# Patient Record
Sex: Male | Born: 1950 | Race: White | Hispanic: No | Smoking: Former smoker
Health system: Southern US, Community
[De-identification: ages and names within clinical notes are randomized; demographics above are authoritative.]

## PROBLEM LIST (undated history)

## (undated) DIAGNOSIS — I330 Acute and subacute infective endocarditis: Secondary | ICD-10-CM

## (undated) DIAGNOSIS — M1990 Unspecified osteoarthritis, unspecified site: Secondary | ICD-10-CM

## (undated) DIAGNOSIS — G4730 Sleep apnea, unspecified: Secondary | ICD-10-CM

## (undated) DIAGNOSIS — N189 Chronic kidney disease, unspecified: Secondary | ICD-10-CM

## (undated) DIAGNOSIS — R011 Cardiac murmur, unspecified: Secondary | ICD-10-CM

## (undated) DIAGNOSIS — Z9889 Other specified postprocedural states: Secondary | ICD-10-CM

## (undated) DIAGNOSIS — I340 Nonrheumatic mitral (valve) insufficiency: Secondary | ICD-10-CM

## (undated) DIAGNOSIS — I219 Acute myocardial infarction, unspecified: Secondary | ICD-10-CM

## (undated) DIAGNOSIS — I469 Cardiac arrest, cause unspecified: Secondary | ICD-10-CM

## (undated) DIAGNOSIS — I639 Cerebral infarction, unspecified: Secondary | ICD-10-CM

## (undated) DIAGNOSIS — K219 Gastro-esophageal reflux disease without esophagitis: Secondary | ICD-10-CM

## (undated) DIAGNOSIS — I10 Essential (primary) hypertension: Secondary | ICD-10-CM

## (undated) DIAGNOSIS — E785 Hyperlipidemia, unspecified: Secondary | ICD-10-CM

## (undated) DIAGNOSIS — I509 Heart failure, unspecified: Secondary | ICD-10-CM

## (undated) DIAGNOSIS — T7840XA Allergy, unspecified, initial encounter: Secondary | ICD-10-CM

## (undated) DIAGNOSIS — R112 Nausea with vomiting, unspecified: Secondary | ICD-10-CM

## (undated) DIAGNOSIS — H269 Unspecified cataract: Secondary | ICD-10-CM

## (undated) DIAGNOSIS — E119 Type 2 diabetes mellitus without complications: Secondary | ICD-10-CM

## (undated) DIAGNOSIS — I4891 Unspecified atrial fibrillation: Secondary | ICD-10-CM

## (undated) DIAGNOSIS — I2510 Atherosclerotic heart disease of native coronary artery without angina pectoris: Secondary | ICD-10-CM

## (undated) HISTORY — DX: Nausea with vomiting, unspecified: Z98.890

## (undated) HISTORY — DX: Unspecified cataract: H26.9

## (undated) HISTORY — DX: Nausea with vomiting, unspecified: R11.2

## (undated) HISTORY — PX: POLYPECTOMY: SHX149

## (undated) HISTORY — DX: Chronic kidney disease, unspecified: N18.9

## (undated) HISTORY — DX: Cardiac arrest, cause unspecified: I46.9

## (undated) HISTORY — PX: TONSILLECTOMY: SUR1361

## (undated) HISTORY — PX: SEPTOPLASTY: SUR1290

## (undated) HISTORY — DX: Unspecified atrial fibrillation: I48.91

## (undated) HISTORY — DX: Cerebral infarction, unspecified: I63.9

## (undated) HISTORY — DX: Acute myocardial infarction, unspecified: I21.9

## (undated) HISTORY — PX: COLONOSCOPY: SHX174

## (undated) HISTORY — DX: Hyperlipidemia, unspecified: E78.5

## (undated) HISTORY — PX: UVULOPALATOPHARYNGOPLASTY: SHX827

## (undated) HISTORY — DX: Heart failure, unspecified: I50.9

## (undated) HISTORY — DX: Type 2 diabetes mellitus without complications: E11.9

## (undated) HISTORY — DX: Sleep apnea, unspecified: G47.30

## (undated) HISTORY — DX: Acute and subacute infective endocarditis: I33.0

## (undated) HISTORY — PX: FOOT SURGERY: SHX648

## (undated) HISTORY — DX: Atherosclerotic heart disease of native coronary artery without angina pectoris: I25.10

## (undated) HISTORY — DX: Other specified postprocedural states: Z98.890

## (undated) HISTORY — DX: Essential (primary) hypertension: I10

## (undated) HISTORY — DX: Allergy, unspecified, initial encounter: T78.40XA

## (undated) HISTORY — PX: CARDIAC CATHETERIZATION: SHX172

---

## 1998-06-06 ENCOUNTER — Other Ambulatory Visit: Admission: RE | Admit: 1998-06-06 | Discharge: 1998-06-06 | Payer: Self-pay | Admitting: Gastroenterology

## 2000-01-21 ENCOUNTER — Ambulatory Visit (HOSPITAL_BASED_OUTPATIENT_CLINIC_OR_DEPARTMENT_OTHER): Admission: RE | Admit: 2000-01-21 | Discharge: 2000-01-21 | Payer: Self-pay | Admitting: Orthopedic Surgery

## 2002-08-24 ENCOUNTER — Ambulatory Visit (HOSPITAL_COMMUNITY): Admission: RE | Admit: 2002-08-24 | Discharge: 2002-08-24 | Payer: Self-pay | Admitting: Family Medicine

## 2002-08-24 ENCOUNTER — Encounter: Payer: Self-pay | Admitting: Cardiology

## 2006-01-04 ENCOUNTER — Emergency Department (HOSPITAL_COMMUNITY): Admission: EM | Admit: 2006-01-04 | Discharge: 2006-01-04 | Payer: Self-pay | Admitting: Emergency Medicine

## 2006-01-21 ENCOUNTER — Encounter: Payer: Self-pay | Admitting: Internal Medicine

## 2006-01-21 ENCOUNTER — Ambulatory Visit (HOSPITAL_BASED_OUTPATIENT_CLINIC_OR_DEPARTMENT_OTHER): Admission: RE | Admit: 2006-01-21 | Discharge: 2006-01-21 | Payer: Self-pay | Admitting: Otolaryngology

## 2006-01-24 ENCOUNTER — Ambulatory Visit: Payer: Self-pay | Admitting: Internal Medicine

## 2006-02-18 ENCOUNTER — Observation Stay (HOSPITAL_COMMUNITY): Admission: RE | Admit: 2006-02-18 | Discharge: 2006-02-19 | Payer: Self-pay | Admitting: Otolaryngology

## 2006-02-18 ENCOUNTER — Encounter (INDEPENDENT_AMBULATORY_CARE_PROVIDER_SITE_OTHER): Payer: Self-pay | Admitting: Specialist

## 2007-10-07 ENCOUNTER — Emergency Department (HOSPITAL_COMMUNITY): Admission: EM | Admit: 2007-10-07 | Discharge: 2007-10-07 | Payer: Self-pay | Admitting: Emergency Medicine

## 2007-10-12 ENCOUNTER — Inpatient Hospital Stay (HOSPITAL_COMMUNITY): Admission: AD | Admit: 2007-10-12 | Discharge: 2007-10-14 | Payer: Self-pay | Admitting: Cardiology

## 2007-10-12 ENCOUNTER — Ambulatory Visit: Payer: Self-pay | Admitting: Internal Medicine

## 2008-03-22 DIAGNOSIS — I330 Acute and subacute infective endocarditis: Secondary | ICD-10-CM

## 2008-03-22 HISTORY — DX: Acute and subacute infective endocarditis: I33.0

## 2008-08-22 ENCOUNTER — Encounter (INDEPENDENT_AMBULATORY_CARE_PROVIDER_SITE_OTHER): Payer: Self-pay | Admitting: *Deleted

## 2008-09-27 ENCOUNTER — Ambulatory Visit: Payer: Self-pay | Admitting: Internal Medicine

## 2008-09-27 DIAGNOSIS — G4730 Sleep apnea, unspecified: Secondary | ICD-10-CM | POA: Insufficient documentation

## 2008-09-27 DIAGNOSIS — J309 Allergic rhinitis, unspecified: Secondary | ICD-10-CM | POA: Insufficient documentation

## 2008-09-27 DIAGNOSIS — T7840XA Allergy, unspecified, initial encounter: Secondary | ICD-10-CM

## 2008-09-27 DIAGNOSIS — I10 Essential (primary) hypertension: Secondary | ICD-10-CM | POA: Insufficient documentation

## 2008-09-27 HISTORY — DX: Allergy, unspecified, initial encounter: T78.40XA

## 2008-10-04 ENCOUNTER — Encounter: Payer: Self-pay | Admitting: Internal Medicine

## 2008-10-08 ENCOUNTER — Encounter: Payer: Self-pay | Admitting: Internal Medicine

## 2008-10-17 ENCOUNTER — Encounter: Payer: Self-pay | Admitting: Internal Medicine

## 2008-11-01 ENCOUNTER — Telehealth: Payer: Self-pay | Admitting: Internal Medicine

## 2009-07-17 ENCOUNTER — Telehealth: Payer: Self-pay | Admitting: Gastroenterology

## 2010-02-24 ENCOUNTER — Ambulatory Visit: Payer: Self-pay | Admitting: Cardiology

## 2010-02-24 ENCOUNTER — Ambulatory Visit (HOSPITAL_COMMUNITY): Admission: RE | Admit: 2010-02-24 | Discharge: 2010-02-24 | Payer: Self-pay | Admitting: Cardiology

## 2010-02-25 ENCOUNTER — Emergency Department (HOSPITAL_COMMUNITY): Admission: EM | Admit: 2010-02-25 | Discharge: 2010-02-26 | Payer: Self-pay | Admitting: Emergency Medicine

## 2010-08-07 NOTE — Progress Notes (Signed)
Summary: report CPAP download  Phone Note Call from Patient   Caller: Patient Call For: Yarenis Cerino Summary of Call: [facility] have not receive report back on c pap machine Initial call taken by: Rickard Patience,  November 01, 2008 10:38 AM  Follow-up for Phone Call        Bjorn Loser do you know anything about this report it looks like it has been sent , please advise I am not sure who to talk advance Renold Genta RCP, LPN  November 01, 2008 10:48 AM    Ballard Russell with [facility] and download faxed over to Dr. Maple Hudson to review and set pressure. Waiting on order from Dr. Maple Hudson. Alfonso Ramus  November 01, 2008 11:24 AM   Additional Follow-up for Phone Call Additional follow up Details #1::        Done Additional Follow-up by: Waymon Budge MD,  November 01, 2008 2:55 PM    Additional Follow-up for Phone Call Additional follow up Details #2::    Called pt and LMOAM that Dr. Maple Hudson has recvd. download and has sent an order to [facility] to change CPAP pressure to a fixed pressure of 10. Advised pt that if he had any questions about order to call me at [phone], if not, [facility] would be getting in contact with him to change his cpap pressure. Follow-up by: Alfonso Ramus,  November 01, 2008 3:42 PM  New/Updated Medications: * CPAP 10 ADVANCED

## 2010-08-07 NOTE — Assessment & Plan Note (Signed)
Summary: SLEEP APNEA///KP   Primary Provider/Referring Provider:  Shary KeyErnesto Rutherford  CC:  Sleep apnea-consult-Dr. [name].  History of Present Illness:  2008-10-02- New Sleep Medicine Consult for Dr [name]. 33 yoM with sleep apnea.  He had NPSG in 2007- mderately severe obstructive sleep apnea with AHI 40.7/hr.(Weight 190). Had UPPP. since then has gained 15 lbs. He sleeps alone and has no report of snoring. He still feels he sleeps better than before surgery, but is fighting sleepiness after lunch. A neighbor gave him a cpap machine, unknown pressure. a full-face mask fits well. It may help some. Bedtime 04-1029 PM, sleep latency 30-60 mnutes, waking 2-3 for nocturia before up at 5AM.  Preventive Screening-Counseling & Management     Smoking Status: quit  Current Medications (verified): 1)  Klor-Con M10 10 Meq Cr-Tabs (Potassium Chloride Crys Cr) .... Take 1 By Mouth Once Daily 2)  Hydrochlorothiazide 25 Mg Tabs (Hydrochlorothiazide) .... Take 1 By Mouth Once Daily 3)  Amlodipine Besylate 5 Mg Tabs (Amlodipine Besylate) .... Take 1 By Mouth Once Daily 4)  Ramipril 10 Mg Caps (Ramipril) .... Take 1 By Mouth Once Daily 5)  Zyrtec Allergy 10 Mg Tabs (Cetirizine Hcl) .... Take 1 By Mouth Once Daily  Allergies (verified): No Known Drug Allergies  Past History:  Family History:    Father- died renal cnacer (Oct 02, 2008)  Social History:    Patient states former smoker. 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, 2008)  Risk Factors:    Alcohol Use: N/A    >5 drinks/d w/in last 3 months: N/A    Caffeine Use: N/A    Diet: N/A    Exercise: N/A  Risk Factors:    Smoking Status: quit (02-Oct-2008)    Packs/Day: N/A    Cigars/wk: N/A    Pipe Use/wk: N/A    Cans of tobacco/wk: N/A    Passive Smoke Exposure: N/A  Past Medical History:    Sleep Apnea- NPSG 01/21/06- AHI 40.7/hr,     Staph endocarditis 2009    Hypertension    Allergic Rhinitis  Past Surgical History:  UPPP, septoplasty- Dr [name]    Tonsillectomy  Family History:    Reviewed history and no changes required:       Father- died renal cnacer, hx heart disease  Social History:    Reviewed history and no changes required:       Patient states former smoker. -quit 1980       divorced       Works Training and development officer    Smoking Status:  quit  Review of Systems      See HPI       The patient complains of nasal congestion/difficulty breathing through nose.  The patient denies shortness of breath with activity, shortness of breath at rest, chest pain, and headaches.    Vital Signs:  Patient profile:   60 year old male Height:      70 inches Weight:      205.13 pounds BMI:     29.54 Pulse rate:   69 / minute BP sitting:   118 / 80  (left arm) Cuff size:   large  Vitals Entered By: Reynaldo Minium CMA (10-02-2008 3:03 PM)  O2 Sat on room air at rest %:  97 CC: Sleep apnea-consult-Dr. [name] Comments Medications reviewed with patient Reynaldo Minium CMA  02-Oct-2008 3:03 PM    Physical Exam  Additional Exam:  General: A/Ox3; pleasant  and cooperative, NAD, mild abdominal obesity SKIN: no rash, lesions NODES: no lymphadenopathy HEENT: [place]/AT, EOM- WNL, Conjuctivae- clear, PERRLA, TM-WNL, Nose- clear, Throat- clear and wnl, Mel[place]atti II NECK: Supple w/ fair ROM, JVD- none, normal carotid impulses w/o bruits Thyroid- CHEST: Clear to P&A HEART: RRR, 2-3/6 systolic murumur apex to upper sternusm ABDOMEN: UXN:ATFT, nl pulses, no edema  NEURO: Grossly intact to observation      Pulmonary Function Test Height (in.): 70 Gender: Male  Impression & Recommendations:  Problem # 1:  SLEEP APNEA (ICD-780.57)  OSA, Says he slept better after  prior surgery. Now more symptomatic partly due to weight gain. We discussed OSA, treatments, safe driving. Wiill get him titrated for an adequate CPAP trial since he has been using an unfitted machine.  Problem # 2:  ALLERGIC RHINITIS (ICD-477.9)  We will watch for impact on sleep - disordered breathing. His updated medication list for this problem includes:    Zyrtec Allergy 10 Mg Tabs (Cetirizine hcl) .Marland Kitchen... Take 1 by mouth once daily  Complete Medication List: 1)  Klor-con M10 10 Meq Cr-tabs (Potassium chloride crys cr) .... Take 1 by mouth once daily 2)  Hydrochlorothiazide 25 Mg Tabs (Hydrochlorothiazide) .... Take 1 by mouth once daily 3)  Amlodipine Besylate 5 Mg Tabs (Amlodipine besylate) .... Take 1 by mouth once daily 4)  Ramipril 10 Mg Caps (Ramipril) .... Take 1 by mouth once daily 5)  Zyrtec Allergy 10 Mg Tabs (Cetirizine hcl) .... Take 1 by mouth once daily  Other Orders: Consultation Level III (73220) DME Referral (DME)  Patient Instructions: 1)  Please schedule a follow-up appointment in 1 month. 2)  See [facility] for CPAP

## 2010-08-07 NOTE — Letter (Signed)
Summary: Recall Colonoscopy Letter  [facility]  [address] [place], [place] [address]   Phone: [phone]  Fax: [phone]      August 22, 2008 MRN: [id number]   Christopher [name] [address] [place], [place]  [address]   Dear [name],   According to your medical record, it is time for you to schedule a Colonoscopy. The American Cancer Society recommends this procedure as a method to detect early colon cancer. Patients with a family history of colon cancer, or a personal history of colon polyps or inflammatory bowel disease are at increased risk.  This letter has beeen generated based on the recommendations made at the time of your procedure. If you feel that in your particular situation this may no longer apply, please contact our office.  Please call our office at [phone] to schedule this appointment or to update your records at your earliest convenience.  Thank you for cooperating with [place] to provide you with the very best care possible.   Sincerely,  Judie Petit T. Russella Dar, M.D.  [facility] [phone]

## 2010-08-07 NOTE — Miscellaneous (Signed)
Summary: CPAP/[facility]  CPAP/[facility]   Imported By: Lanelle Bal 10/10/2008 13:11:36  _____________________________________________________________________  External Attachment:    Type:   Image     Comment:   External Document

## 2010-08-07 NOTE — Progress Notes (Signed)
Summary: Change practices.   Phone Note Outgoing Call Call back at [facility] Phone [phone]   Call placed by: Harlow Mares CMA Duncan Dull),  July 17, 2009 3:04 PM Call placed to: Patient Summary of Call: patient states that last year his [facility] MD sent him to Dr. Loreta Ave for a colonoscopy due to his anemia. I had Lady Gary put a note in IDX that the patient changed practices.  Initial call taken by: Harlow Mares CMA (AAMA),  July 17, 2009 3:05 PM

## 2010-09-19 LAB — URINALYSIS, ROUTINE W REFLEX MICROSCOPIC
Bilirubin Urine: NEGATIVE
Glucose, UA: NEGATIVE mg/dL
Hgb urine dipstick: NEGATIVE
Ketones, ur: NEGATIVE mg/dL
Nitrite: NEGATIVE
Protein, ur: NEGATIVE mg/dL
Specific Gravity, Urine: 1.008 (ref 1.005–1.030)
Urobilinogen, UA: 0.2 mg/dL (ref 0.0–1.0)
pH: 7 (ref 5.0–8.0)

## 2010-11-18 NOTE — H&P (Signed)
NAME:  Christopher [name], Christopher [name] NO.:  [id number]   MEDICAL RECORD NO.:  [id number]          PATIENT TYPE:  INP   LOCATION:  3737                         FACILITY:  [facility]   PHYSICIAN:  Colleen Can. Deborah Chalk, M.D.DATE OF BIRTH:  08-17-1950   DATE OF ADMISSION:  10/12/2007  DATE OF DISCHARGE:                              HISTORY & PHYSICAL   CHIEF COMPLAINT:  Weakness, fevers in the setting of a murmur.   HISTORY OF PRESENT ILLNESS:  Mr. Thivierge is a 60 year old white male who  was referred to our office for evaluation of weakness and the  possibility of coronary artery disease.  Approximately 3 days ago, over  the course of the past weekend while he was at work, he had a feeling of  marked weakness.  He had no real chest pain but felt very pale and very  clammy and was noted to be hypotensive.  He has had a long history of a  heart murmur and has had recurrent fevers over the past 2 months.  He  has taken antibiotics off and on, and there was a concern that this  might be sinusitis.  Apparently he has had bouts of sinusitis and has  had previous operations in the past and was presenting with facial  pressure at that time.  He had not had cough or really sputum  production.  He has undergone a 2-D echocardiogram, which showed an  ejection fraction at 55% to 60%.  There was mitral valve prolapse with  moderate MR, diastolic dysfunction, normal LV systolic function and  redundant anterior mitral valve leaflet, and vegetation could not be  excluded.  The following day, he underwent a stress Cardiolite study,  where he exercised for 7 minutes.  His heart rate was inadequate, and he  subsequently underwent an adenosine protocol.  With that he had a small  apical perfusion defect and cardiac catheterization was entertained.  In  the interim, he was referred for blood cultures.  These have come back  with one set being positive for gram-positive cocci in chains.  He is  now admitted  with what is presumed to be SBE.   PAST MEDICAL HISTORY:  1. Hypertension.  2. Longstanding history of a heart murmur.  3. Recurrent fevers over the past 2 months.  4. Sinusitis.  5. Previous sinus surgery.  6. History of kidney stones.  7. Strongly positive family history of heart disease.  8. History of anemia.  9. Tonsillectomy.  10.Foot surgery.  11.Hiatal hernia.   ALLERGIES:  PENICILLIN, WHICH IS QUESTIONABLE.  He notes that when he  was a young child he had white spots on the back of his throat and there  was concern for a possible penicillin allergy; however, he states he has  taken penicillin products since that time and has had no problems.  He  is unable to give the names of other antibiotics that he has been  treated with, however.   CURRENT MEDICATIONS:  His current medicines are ramipril 10 mg a day and  Zyrtec 10 mg a day.   FAMILY  HISTORY:  Father died at 3 with heart disease, kidney disease,  cancer and diabetes.  Mother is alive at the age of 61.  He has had one  brother who died recently at the age of 13 due to a heart attack.   SOCIAL HISTORY:  He is divorced.  He drinks beer occasionally.  He has  not smoked in over 30 years.  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   PHYSICAL EXAMINATION:  GENERAL:  On exam, he is somewhat anxious.  He is  in no acute distress.  VITAL SIGNS:  His weight is 198-1/2 pounds, blood pressure 122/70  sitting, 120/60 standing, heart rate 100, respirations 18.  He is  afebrile.  SKIN:  Warm and dry.  Color is somewhat sallow.  NECK:  Supple.  LUNGS:  Clear.  HEART:  Shows a grade 2 to 3 apical murmur.  ABDOMEN:  Soft.  EXTREMITIES:  Without edema.  NEUROLOGIC:  Shows no gross focal deficits.   Pertinent labs are pending except for one blood culture that is positive  for gram-positive cocci in chains.   OVERALL IMPRESSION:  1. 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  2. Multiple somatic complaints with weakness,  recent hypotension and      fevers.  3. History of hypertension.  4. History of kidney stones.  5. Positive family history of heart disease.   PLAN:  The patient is admitted from the office.  Repeat blood cultures  will be drawn.  Infectious disease consult has been called for.  Further  treatment plan to follow per Dr. Ronnald Nian discretion.      Sharlee Blew, N.P.      Colleen Can. Deborah Chalk, M.D.  Electronically Signed    LC/MEDQ  D:  10/12/2007  T:  10/12/2007  Job:  [id number]

## 2010-11-21 NOTE — Discharge Summary (Signed)
NAME:  Christopher [name], Christopher [name] NO.:  [id number]   MEDICAL RECORD NO.:  [id number]          PATIENT TYPE:  INP   LOCATION:  3737                         FACILITY:  [facility]   PHYSICIAN:  Colleen Can. Deborah Chalk, M.D.DATE OF BIRTH:  29-Mar-1951   DATE OF ADMISSION:  10/12/2007  DATE OF DISCHARGE:  10/14/2007                               DISCHARGE SUMMARY   DISCHARGE DIAGNOSIS:  1. Endocarditis.  2. Hypertension.  3. Long-standing history of a heart murmur.  4. Hiatal hernia.   HISTORY OF PRESENT ILLNESS:  Mr. Conradt is a 60 year old white male who  was referred to our office for evaluation of weakness and the  possibility of coronary artery disease.  He has had a brother who  recently died at the age of 67 due to heart attack.  He is noted over  the past 2 months that he has had recurrent fevers off and on.  He has  used some antibiotics off and on as well thinking that this might be  sinusitis.  He has not really had cough or sputum production.  He was in  the emergency room over the course of 2 weekends ago and basically had a  negative evaluation.  Since that time, he has had a 2-D echocardiogram  which showed an ejection fraction of 55-60% with mitral valve prolapse.  There was a question of a vegetational anterior mitral valve leaflet.  Following day, he underwent a stress cardiology study, where he  exercised for 7 minutes and subsequently, switched to an adenosine  protocol because his heart rate was inadequate.  He did have a small  apical perfusion defect and the idea of cardiac catheterization was  entertained.  However, in the interim, the patient had been referred for  blood cultures.  Once it did come back positive for gram positive cocci  in chains, and he was subsequently admitted which was presumed to be  SBE.   Please see a dictated history and physical for further patient  presentation and profile.   LABORATORY DATA:  On admission, his PT and PTT were  unremarkable.  His  chemistries on admission were normal except for a glucose of 113, BUN  was 13, creatinine 0.9.  TSH was normal at 2.557.  Urinalysis was  unremarkable.  Chest x-ray following PICC line showed mild cardiomegaly  and central venous congestion.   HOSPITAL COURSE:  The patient was admitted electively from the office  which was presumed to be SBE.  Antibiotics with ampicillin and  gentamicin were subsequently started.  The patient was seen in  consultation by infectious disease.  Repeat blood cultures were drawn as  well, which at this point in time, have showed no further growth.  He  has currently had 2 out of 4 blood cultures positive for Strep viridans.  On Friday, October 14, 2007, the patient was felt to be a stable candidate  for discharge following PICC line placement with plans for outpatient  antibiotic therapy.   Discharge condition is stable.   DISCHARGE INSTRUCTIONS:  He is to follow up with Dr. Deborah Chalk upon  discharge.  He will need to have followup blood cultures 2 weeks and  again in 4 weeks after his last dose of antibiotics.   DISCHARGE MEDICINES:  1. Altace 10 mg a day.  2. Zyrtec daily.  3. Rocephin 2 g IV daily through Nov 08, 2007 via PICC line with      dressing changes for advanced wound care.  4. Potassium 10 mEq b.i.d.  5. Potassium citrate 10 mg b.i.d.  6. Hydrochlorothiazide 25 b.i.d. were restarted as well.   He is to call our office for an appointment, and certainly sooner, if  any problems would arise in the interim.      Sharlee Blew, N.P.      Colleen Can. Deborah Chalk, M.D.  Electronically Signed    LC/MEDQ  D:  10/17/2007  T:  10/18/2007  Job:  [id number]   cc:   Dr. Orvan Falconer

## 2010-11-21 NOTE — Discharge Summary (Signed)
NAME:  Christopher [name], VITOLO NO.:  [id number]   MEDICAL RECORD NO.:  [id number]          PATIENT TYPE:  OBV   LOCATION:  3311                         FACILITY:  [facility]   PHYSICIAN:  Hermelinda Medicus, M.D.   DATE OF BIRTH:  May 08, 1951   DATE OF ADMISSION:  02/18/2006  DATE OF DISCHARGE:  02/19/2006                                 DISCHARGE SUMMARY   This patient is a 60 year old male who has had considerable difficulty with  his sinuses.  He has had treatments medically on several occasions and also  more recently has had a CAT scan of his sinuses which showed mucosal  thickening of the maxillary with mucous retention cyst and also of the  ethmoid sinuses.  He also has a septal deviation, left turbinate  hypertrophy.  He also was tested recently with a polysomnogram showing sleep  apnea.  He has moderately severe obstructive sleep apnea diagnosed with an  AHI/RDI of 40.7.  Lowest O2 was 79%.  It was felt that he was a candidate  for CPAP, but it was refused, and he could not possibly have used the CPAP  because of the septal deviation and turbinate hypertrophy.  He wishes to  have surgery to improve his nasal airway, improve his sinus status and  improve his sleep status.  HE HAS AN ALLERGY TO PENICILLIN.   His further past history is one of tonsillectomy.  He has had a hiatal  hernia but no surgery, bilateral foot surgery.  He had a trigger finger  surgery.  He has passed a kidney stone.  He quit smoking 25 years ago.   His EKG shows normal sinus rhythm with left axis deviation with a right  bundle branch block.  Chest x-ray shows no abnormalities.   His medications are:  1. Altace 10.  2. Norvasc 5.  3. Zyrtec p.r.n. 10 mg.  4. Mucinex 600 p.r.n.  5. Antibiotics used in the past for his sinus issues.   HIS PENICILLIN ALLERGY WOULD CAUSE MOUTH AND THROAT ULCERS.   His chest x-ray showed no active lung disease.  He had a 2-D transthoracic  study for his heart, and  left ventricular size was the upper limits of  normal.  Left ventricular ejection fraction was estimated at 50 to 55%. He  also had a moderate holosystolic mitral valve prolapse involving a posterior  leaflet.  There was moderate to severe mitral valve regurgitation.  The  volume of mitral regurgitation was 177 mL.  The left atrium was mildly  dilated.   PHYSICAL EXAMINATION:  VITAL SIGNS:  Physical examination reveals the blood  pressure to be 145/81.  He weighs 88 kg.  He is 5 feet 10.  Heart rate was  81, O2 saturation was 93.  HEENT:  His ears are clear.  Tympanic membranes are clear.  Nose shows a  septal deviation to the left with turbinate hypertrophy.  He also has a very  low uvula and palate with a small oral cavity.  His larynx is clear.  True  cords, false cords, epiglottis, base of tongue are  clear of any ulceration  or mass. True cord mobility, gag reflex, tongue mobility, EOMs, and facial  nerve are all symmetrical.  No evidence of any mass or ulceration.  NECK:  Is free of any thyromegaly but is full.  We see his tongue high up  into his mouth.  CHEST:  Clear. No rales, rhonchi or wheezes.  CARDIOVASCULAR:  __________ murmurs or gallops.  He is a very strong, and  his heart is hard to hear because he has very strong back muscles.  He is in  excellent condition.  He is not obese.  ABDOMEN:  Is unremarkable.  No liver, spleen or kidneys palpable.  EXTREMITIES:  Unremarkable.  Above mentioned surgeries noted.   DIAGNOSES:  1. Sleep apnea, septal reconstruction for septal deviation with maxillary      and ethmoid sinusitis, bilateral.  2. HISTORY OF PENICILLIN ALLERGY.  3. History of mitral valve prolapse.  4. History of hiatal hernia.  5. History of kidney stones.   The patient underwent a functional endoscopic sinus surgery, bilateral  ethmoidectomy, bilateral maxillary sinus ostial enlargement, turbinate  reduction, septal reconstruction, laser assisted  uvulopalatoplasty under  local MAC anesthesia with Dr. Noreene Larsson.  The patient did very well during and  postoperatively.  His pathology was within normal limits showing just  inflammation and no evidence of any malignancy.  His postoperative course  was uneventful.  His blood pressure was 147/88.  SAO2 was 95-100 on room  air.  He had minimal bleeding and some bloody mucus.  His SAO2 the following  day on August 17 was 95-99%, pulse 80, no bleeding, no nausea.  The patient  was doing very well on soft diet and was discharged to follow up my office.   FINAL DIAGNOSES:  1. Septal deviation.  2. Turbinate hypertrophy.  3. Bilateral maxillary ethmoid sinusitis.  4. Sleep apnea issues.  5. History of foot surgery.  6. History of trigger finger.  7. History of tonsillectomy.  8. History of hiatal hernia.     He was previously under Dr. Weyman Croon Wilson's care.  I believe now Dr.  Tawana Scale records are in Dr. Rosanne Ashing Kindl's hands.           ______________________________  Hermelinda Medicus, M.D.     JC/MEDQ  D:  03/10/2006  T:  03/11/2006  Job:  [id number]   cc:   [facility]gent [facility]

## 2010-11-21 NOTE — Op Note (Signed)
NAME:  Christopher [name], Christopher [name] NO.:  [id number]   MEDICAL RECORD NO.:  [id number]          PATIENT TYPE:  OBV   LOCATION:  2550                         FACILITY:  [facility]   PHYSICIAN:  Hermelinda Medicus, M.D.   DATE OF BIRTH:  20-Jun-1951   DATE OF PROCEDURE:  02/18/2006  DATE OF DISCHARGE:                                 OPERATIVE REPORT   PREOPERATIVE DIAGNOSIS:  Sleep apnea, with snoring with septal deviation,  turbinate hypertrophy, with bilateral maxillary and ethmoid sinusitis.   POSTOPERATIVE DIAGNOSIS:  Sleep apnea, with snoring with septal deviation,  turbinate hypertrophy, with bilateral maxillary and ethmoid sinusitis.   OPERATION:  Functional endoscopic sinus surgery, bilateral ethmoidectomy,  bilateral maxillary sinus ostial enlargement, turbinate reduction, septal  reconstruction, and a laser-assisted uvulopalatoplasty.   OPERATOR:  Hermelinda Medicus, M.D.   ANESTHESIA:  Local anesthesia with MAC, with Guadalupe Maple, M.D.   SURGEON:  Hermelinda Medicus, M.D.   PROCEDURE:  With the patient placed in the supine position under local  anesthesia, 1% Xylocaine with epinephrine, total use was 13 cc, and 200 mg  of topical cocaine was used in the nose.  The turbinates were outfractured,  and also the Elmed was used at 12 to reduce these inferior turbinates  laterally and inferiorly. The septum was then approached through a  hemitransfixion incision.  This was carried around the columella to the mid-  left columella and quadrilateral cartilage and ethmoid septum.  The septum  was off the premaxillary crest.  This was corrected by removing a portion of  cartilage, and then the 4-mm chisel was used to take down the premaxillary  crest deviation. The ethmoid septum was taken down using the open and closed  Jansen-Middleton as well as the Takahashi forceps, and then closure was  completed with 5-0 plain catgut and a through-and-through septal suture with  4-0 plain.  Once we  could now well within the nose, we approached the  ethmoid sinus, and mucoid material was removed using the 0-degrees scope,  the 0 and the upbiting Blakesley-Wilders. Also, the maxillary sinus was  approached, where the natural ostium could not be seen.  It was palpated  with a curved suction. Once this was established, a side-biting forceps was  used to increase this to 5 times its natural size, and the sinus was  suctioned of some old thick mucus. This was seen on both sides.  Once this  was completed, the sinuses were suctioned, and Gelfoam was placed within the  ethmoid sinus anteriorly, and Gelfilm was placed within the nose to hold  middle turbinates medial.  Attention was then carried to the oral cavity,  where the patient's eyes were covered with wet sponges and his face was  covered with a wet towel.  __________  glasses were placed, and the laser  was set at 10 watts continuous, and for 5 minutes, we used this, trimming  the uvula and making incisions just lateral and superiorly to the uvula on  the palate to raise the contour of the palate.  This was done after  Cetacaine spray  was completed for anesthesia as well as 1% Xylocaine with  epinephrine injected.  The patient tolerated this portion of the procedure  well.  Obviously, all oxygen was discontinued, and then attention was  carried back to the nose, where  the nose was suctioned as well as the oral cavity.  Trumpets were placed  within the nose.  He was taken to the recovery room in good condition and is  doing well postoperatively.  Followup will be overnight 23-hour observation,  and then will be in 5 days, and 2 weeks, 4 weeks, 6 weeks, 3 months, and 6  months.           ______________________________  Hermelinda Medicus, M.D.     JC/MEDQ  D:  02/18/2006  T:  02/18/2006  Job:  [id number]   cc:   [facility] [address]

## 2010-11-21 NOTE — H&P (Signed)
NAME:  Christopher [name], Christopher [name] NO.:  [id number]   MEDICAL RECORD NO.:  [id number]          PATIENT TYPE:  OBV   LOCATION:  2550                         FACILITY:  [facility]   PHYSICIAN:  Hermelinda Medicus, M.D.   DATE OF BIRTH:  [birth year]   DATE OF ADMISSION:  02/18/2006  DATE OF DISCHARGE:                                HISTORY & PHYSICAL   This patient is a 60 year old male who has had difficulty with his sinuses.  He has had a CAT scan of the sinuses which showed mucosal thickening in the  maxillary with a mucous retention cyst and also in the ethmoid sinuses.  He  also has a septal deviation to the left with turbinate hypertrophy.  He also  has a situation where he is tested for a sleep apnea with a nocturnal  polysomnogram.  Moderately severe obstructive sleep apnea was diagnosed with  an AHI or RDI of 40.7 and his lowest O2 was his desaturation nadir was 79%.  They felt he was a candidate for CPAP which he refused.  He wishes to have  surgery to improve his nasal airway, improve his sinus status, and to  improve his sleep status also.  His past history is one of allergy to  PENICILLIN.  He has had a tonsillectomy.  He has had a hiatal hernia, but no  surgery.  He has had bilateral foot surgery.  He has had a trigger finger  surgery and he has passed a kidney stone.  He quit smoking about 25 years  ago.  His EKG shows a normal sinus rhythm, left axis deviation with a right  bundle branch block and his chest x-ray shows no abnormalities.   MEDICATIONS:  1. Altace 10 mg per day.  2. Norvasc 5 per day.  3. Zyrtec p.r.n.  4. Mucinex p.r.n.  5. Antibiotics in the past for his sinus status.   His chest x-ray shows no active lung disease.  He has had a 2-D study  transthoracic of his heart and the summary is left ventricular size that is  upper limits of normal.  Left ventricular ejection fraction was estimated at  50-55%.  He also has a moderate holosystolic mitral valve  prolapse involving  the posterior leaflet.  There was moderate to severe mitral valvular  regurgitation.  The volume of mitral regurgitation was 177 mL.  The left  atrium was mildly dilated.   PHYSICAL EXAMINATION:  VITAL SIGNS:  Blood pressure 145/81.  He weighs 88  kg.  He is 5 feet 10 inches.  Heart rate 81.  O2 saturation 93.  His  penicillin causes mouth and throat ulcers.  HEENT:  His ears are clear.  Tympanic membranes are clear.  His nose shows a  septal deviation to the left and with turbinate hypertrophy.  He also has a  very low uvula and palate and a small oral cavity.  His larynx is clear.  True cords, false cords, epiglottis, base of tongue are clear.  True cord  mobility, gag reflex, tongue mobility, EOMs, facial nerve are all  symmetrical.  No  evidence of any mass or ulceration.  Neck is free of any  thyromegaly, but is full pushing his tongue high into his mouth.  CHEST:  Clear.  No rales, rhonchi or wheezes.  He has got a very strong man.  He is hard to hear his breath sounds because of thick back muscle.  HEART:  No obvious murmur heard.  No opening snaps or gallops.  ABDOMEN:  Unremarkable.  EXTREMITIES:  Unremarkable.   See above-mentioned surgeries.   INITIAL DIAGNOSIS:  Sleep apnea with snoring with septal deviation with  maxillary sinusitis and ethmoid sinusitis, bilateral with history of  penicillin allergy with history of mitral valve prolapse with history of  hiatal hernia, history of kidney stones.           ______________________________  Hermelinda Medicus, M.D.     JC/MEDQ  D:  02/18/2006  T:  02/18/2006  Job:  [id number]   cc:   [facility] [address]

## 2010-12-10 ENCOUNTER — Encounter: Payer: Self-pay | Admitting: Cardiology

## 2010-12-24 ENCOUNTER — Other Ambulatory Visit: Payer: Self-pay | Admitting: Nurse Practitioner

## 2010-12-24 DIAGNOSIS — I38 Endocarditis, valve unspecified: Secondary | ICD-10-CM

## 2011-01-08 ENCOUNTER — Encounter: Payer: Self-pay | Admitting: Cardiology

## 2011-01-08 ENCOUNTER — Ambulatory Visit (HOSPITAL_COMMUNITY): Payer: BC Managed Care – PPO | Attending: Cardiology | Admitting: Radiology

## 2011-01-08 ENCOUNTER — Ambulatory Visit (INDEPENDENT_AMBULATORY_CARE_PROVIDER_SITE_OTHER): Payer: BC Managed Care – PPO | Admitting: Cardiology

## 2011-01-08 VITALS — BP 133/74 | HR 62 | Resp 14 | Ht 70.0 in | Wt 203.0 lb

## 2011-01-08 DIAGNOSIS — I10 Essential (primary) hypertension: Secondary | ICD-10-CM | POA: Insufficient documentation

## 2011-01-08 DIAGNOSIS — I38 Endocarditis, valve unspecified: Secondary | ICD-10-CM

## 2011-01-08 DIAGNOSIS — Z789 Other specified health status: Secondary | ICD-10-CM

## 2011-01-08 DIAGNOSIS — I379 Nonrheumatic pulmonary valve disorder, unspecified: Secondary | ICD-10-CM | POA: Insufficient documentation

## 2011-01-08 DIAGNOSIS — I340 Nonrheumatic mitral (valve) insufficiency: Secondary | ICD-10-CM

## 2011-01-08 DIAGNOSIS — I059 Rheumatic mitral valve disease, unspecified: Secondary | ICD-10-CM

## 2011-01-08 DIAGNOSIS — Z9189 Other specified personal risk factors, not elsewhere classified: Secondary | ICD-10-CM

## 2011-01-08 DIAGNOSIS — I339 Acute and subacute endocarditis, unspecified: Secondary | ICD-10-CM

## 2011-01-08 MED ORDER — AMLODIPINE BESYLATE 5 MG PO TABS: 5.0000 mg | ORAL_TABLET | Freq: Every day | ORAL | Status: DC

## 2011-01-08 MED ORDER — ASPIRIN EC 81 MG PO TBEC: 81.0000 mg | DELAYED_RELEASE_TABLET | Freq: Every day | ORAL | Status: DC

## 2011-01-08 MED ORDER — RAMIPRIL 10 MG PO CAPS: 10.0000 mg | ORAL_CAPSULE | Freq: Every day | ORAL | Status: DC

## 2011-01-08 NOTE — Patient Instructions (Signed)
Your physician has recommended you make the following change in your medication:  Start Aspirin 81 mg daily  Your physician recommends that you return for lab work on Friday July 13,2012 for fasting cholesterol,liver, and bmp.  Your physician recommends that you schedule a follow-up appointment in: 1 year with Dr. [name]  Your physician has requested that you have an echocardiogram. Echocardiography is a painless test that uses sound waves to create images of your heart. It provides your doctor with information about the size and shape of your heart and how well your heart's chambers and valves are working. This procedure takes approximately one hour. There are no restrictions for this procedure.  IN 1 YEAR

## 2011-01-09 DIAGNOSIS — Z9189 Other specified personal risk factors, not elsewhere classified: Secondary | ICD-10-CM | POA: Insufficient documentation

## 2011-01-09 NOTE — Progress Notes (Signed)
PCP: Dr. Perrin Maltese  60 yo with history of mitral valve prolapse and prior mitral valve endocarditis with moderate to severe mitral regurgitation presents for cardiology followup.  He has been seen by Dr. Deborah Chalk in the past and is seen by me for the first time today.  His mitral regurgitation is being closely followed with yearly echoes.  Echo this year shows preserved EF, upper normal LV size, mitral valve prolapse with partial flail posterior leaflet, and moderate to possibly severe eccentric mitral regurgitation.    Symptomatically, patient is doing well.  He has a physical job and is able to work without any exertional dyspnea.  He recently painted his apartment.  He can walk and climb steps without problem.  No chest pain.    ECG: NSR, LAFB, RBBB  PMH: 1. HTN 2. H/o sinusitis 3. Nephrolithiasis 4. Mitral valve disease: Baseline mitral valve prolapse.  Had MV endocarditis (Strep viridans) in 4/09.  Has had moderate to severe mitral regurgitation.  Last echo (7/12): EF 60%, upper normal LV size (EDD 55 mm, ESD 35 mm), moderate (grade II) diastolic dysfunction with E/medial e' < 15, moderate to possibly severe eccentric MR with mitral valve prolapse and partial flail posterior leaflet, mild LAE.   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  Occasional ETOH.  No smoking.   FH: Father with CAD, brother with MI at 97.   ROS: All systems reviewed and negative except as per HPI.   Current Outpatient Prescriptions  Medication Sig Dispense Refill  . amLODipine (NORVASC) 5 MG tablet Take 1 tablet (5 mg total) by mouth daily.  90 tablet  3  . cetirizine (ZYRTEC) 10 MG tablet Take 10 mg by mouth daily.        . ramipril (ALTACE) 10 MG capsule Take 1 capsule (10 mg total) by mouth daily.  90 capsule  3  . aspirin EC 81 MG tablet Take 1 tablet (81 mg total) by mouth daily.  150 tablet  2    BP 133/74  Pulse 62  Resp 14  Ht 5\' 10"  (1.778 m)  Wt 203 lb (92.08 kg)  BMI 29.13 kg/m2 General:  NAD Neck: No JVD, no thyromegaly or thyroid nodule.  Lungs: Clear to auscultation bilaterally with normal respiratory effort. CV: Nondisplaced PMI.  Heart regular S1/S2, no S3/S4, 3/6 HSM at apex.  No peripheral edema.  No carotid bruit.  Normal pedal pulses.  Abdomen: Soft, nontender, no hepatosplenomegaly, no distention.  Neurologic: Alert and oriented x 3.  Psych: Normal affect. Extremities: No clubbing or cyanosis.

## 2011-01-09 NOTE — Assessment & Plan Note (Addendum)
Moderate to possibly severe eccentric MR with MV prolapse and history of MV endocarditis.  The LV is not significantly dilated and EF is 60%.  E/medial e' is < 15, suggesting that LV end diastolic pressure is not significantly elevated.  Patient does not have exertional dyspnea.  As he is asymptomatic and LV size and function are preserved, will continue to monitor the valve with watchful waiting.  I suspect that at some point in the future he will need valve repair.  Repeat echo in 1 year.  He should take endocarditis prophylaxis with dental work given history of SBE.

## 2011-01-09 NOTE — Assessment & Plan Note (Signed)
Patient has a family history of CAD.  No ischemic symptoms.  He should take ASA 81 mg daily and will check his lipids.

## 2011-01-09 NOTE — Assessment & Plan Note (Signed)
BP appears to be reasonably controlled on ramipril and amlodipine.

## 2011-01-16 ENCOUNTER — Other Ambulatory Visit (INDEPENDENT_AMBULATORY_CARE_PROVIDER_SITE_OTHER): Payer: BC Managed Care – PPO | Admitting: *Deleted

## 2011-01-16 DIAGNOSIS — I059 Rheumatic mitral valve disease, unspecified: Secondary | ICD-10-CM

## 2011-01-16 LAB — LIPID PANEL
Cholesterol: 199 mg/dL (ref 0–200)
HDL: 47.6 mg/dL (ref >39.00)
LDL Cholesterol: 128 mg/dL — ABNORMAL HIGH (ref 0–99)
Total CHOL/HDL Ratio: 4
Triglycerides: 117 mg/dL (ref 0.0–149.0)
VLDL: 23.4 mg/dL (ref 0.0–40.0)

## 2011-01-16 LAB — BASIC METABOLIC PANEL
BUN: 15 mg/dL (ref 6–23)
CO2: 31 mEq/L (ref 19–32)
Calcium: 8.8 mg/dL (ref 8.4–10.5)
Chloride: 106 mEq/L (ref 96–112)
Creatinine, Ser: 0.8 mg/dL (ref 0.4–1.5)
GFR: 104.31 mL/min (ref >60.00)
Glucose, Bld: 95 mg/dL (ref 70–99)
Potassium: 4.2 mEq/L (ref 3.5–5.1)
Sodium: 141 mEq/L (ref 135–145)

## 2011-01-16 LAB — HEPATIC FUNCTION PANEL
ALT: 24 U/L (ref 0–53)
AST: 24 U/L (ref 0–37)
Albumin: 4.3 g/dL (ref 3.5–5.2)
Alkaline Phosphatase: 59 U/L (ref 39–117)
Bilirubin, Direct: 0.2 mg/dL (ref 0.0–0.3)
Total Bilirubin: 0.9 mg/dL (ref 0.3–1.2)
Total Protein: 6.8 g/dL (ref 6.0–8.3)

## 2011-03-03 ENCOUNTER — Encounter: Payer: Self-pay | Admitting: Cardiology

## 2011-03-05 ENCOUNTER — Encounter: Payer: Self-pay | Admitting: Cardiology

## 2011-03-05 NOTE — Progress Notes (Signed)
Quick Note:  [facility] RN left message with Pt results.  RN Preliminarily reviewed. Forwarded to MD desktop for review and signature   ______

## 2011-03-31 LAB — COMPREHENSIVE METABOLIC PANEL
ALT: 20
ALT: 22
AST: 23
AST: 25
Albumin: 3.5
Albumin: 3.6
Alkaline Phosphatase: 49
Alkaline Phosphatase: 52
BUN: 13
BUN: 16
CO2: 26
CO2: 29
Calcium: 8.4
Calcium: 8.9
Chloride: 100
Chloride: 99
Creatinine, Ser: 0.91
Creatinine, Ser: 1.05
GFR calc Af Amer: >60
GFR calc Af Amer: >60
GFR calc non Af Amer: >60
GFR calc non Af Amer: >60
Glucose, Bld: 112 — ABNORMAL HIGH
Glucose, Bld: 113 — ABNORMAL HIGH
Potassium: 3.3 — ABNORMAL LOW
Potassium: 3.8
Sodium: 135
Sodium: 136
Total Bilirubin: 0.6
Total Bilirubin: 0.7
Total Protein: 5.8 — ABNORMAL LOW
Total Protein: 6.5

## 2011-03-31 LAB — CULTURE, BLOOD (ROUTINE X 2)
Culture: NO GROWTH
Culture: NO GROWTH

## 2011-03-31 LAB — URINALYSIS, ROUTINE W REFLEX MICROSCOPIC
Bilirubin Urine: NEGATIVE
Bilirubin Urine: NEGATIVE
Glucose, UA: NEGATIVE
Glucose, UA: NEGATIVE
Hgb urine dipstick: NEGATIVE
Hgb urine dipstick: NEGATIVE
Ketones, ur: NEGATIVE
Ketones, ur: NEGATIVE
Nitrite: NEGATIVE
Nitrite: NEGATIVE
Protein, ur: NEGATIVE
Protein, ur: NEGATIVE
Specific Gravity, Urine: 1.022
Specific Gravity, Urine: 1.023
Urobilinogen, UA: 0.2
Urobilinogen, UA: 1
pH: 6.5
pH: 7

## 2011-03-31 LAB — CK TOTAL AND CKMB (NOT AT ARMC)
CK, MB: 1.4
Relative Index: 1.3
Total CK: 107

## 2011-03-31 LAB — CBC
HCT: 36.6 — ABNORMAL LOW
HCT: 37.2 — ABNORMAL LOW
Hemoglobin: 12.5 — ABNORMAL LOW
Hemoglobin: 13
MCHC: 34.2
MCHC: 34.8
MCV: 85.8
MCV: 86.8
Platelets: 132 — ABNORMAL LOW
Platelets: 153
RBC: 4.22
RBC: 4.34
RDW: 14
RDW: 14.4
WBC: 5.3
WBC: 6.2

## 2011-03-31 LAB — DIFFERENTIAL
Basophils Absolute: 0
Basophils Relative: 0
Eosinophils Absolute: 0
Eosinophils Relative: 0
Lymphocytes Relative: 9 — ABNORMAL LOW
Lymphs Abs: 0.5 — ABNORMAL LOW
Monocytes Absolute: 0.4
Monocytes Relative: 7
Neutro Abs: 5.3
Neutrophils Relative %: 85 — ABNORMAL HIGH

## 2011-03-31 LAB — APTT: aPTT: 30

## 2011-03-31 LAB — GENTAMICIN LEVEL, TROUGH: Gentamicin Trough: 0.6

## 2011-03-31 LAB — SAMPLE TO BLOOD BANK

## 2011-03-31 LAB — PROTIME-INR
INR: 0.9
Prothrombin Time: 12.6

## 2011-03-31 LAB — TROPONIN I: Troponin I: 0.01

## 2011-03-31 LAB — GENTAMICIN LEVEL, PEAK: Gentamicin Pk: 2.6 — ABNORMAL LOW

## 2011-03-31 LAB — TSH: TSH: 2.557

## 2011-11-25 ENCOUNTER — Other Ambulatory Visit: Payer: Self-pay | Admitting: Family Medicine

## 2011-11-25 MED ORDER — FLUTICASONE PROPIONATE 50 MCG/ACT NA SUSP: 2.0000 | Freq: Every day | NASAL | Status: DC

## 2011-12-23 ENCOUNTER — Other Ambulatory Visit: Payer: Self-pay | Admitting: Internal Medicine

## 2012-01-04 ENCOUNTER — Other Ambulatory Visit: Payer: Self-pay | Admitting: Cardiology

## 2012-01-06 ENCOUNTER — Other Ambulatory Visit (HOSPITAL_COMMUNITY): Payer: Self-pay | Admitting: Cardiology

## 2012-01-06 DIAGNOSIS — I059 Rheumatic mitral valve disease, unspecified: Secondary | ICD-10-CM

## 2012-01-08 ENCOUNTER — Ambulatory Visit (INDEPENDENT_AMBULATORY_CARE_PROVIDER_SITE_OTHER): Payer: BC Managed Care – PPO | Admitting: Cardiology

## 2012-01-08 ENCOUNTER — Encounter: Payer: Self-pay | Admitting: Cardiology

## 2012-01-08 ENCOUNTER — Ambulatory Visit (HOSPITAL_COMMUNITY): Payer: BC Managed Care – PPO | Attending: Internal Medicine | Admitting: Radiology

## 2012-01-08 VITALS — BP 126/80 | HR 64 | Ht 70.0 in | Wt 208.0 lb

## 2012-01-08 DIAGNOSIS — I340 Nonrheumatic mitral (valve) insufficiency: Secondary | ICD-10-CM

## 2012-01-08 DIAGNOSIS — Z87891 Personal history of nicotine dependence: Secondary | ICD-10-CM | POA: Insufficient documentation

## 2012-01-08 DIAGNOSIS — I059 Rheumatic mitral valve disease, unspecified: Secondary | ICD-10-CM

## 2012-01-08 DIAGNOSIS — I10 Essential (primary) hypertension: Secondary | ICD-10-CM

## 2012-01-08 DIAGNOSIS — G4730 Sleep apnea, unspecified: Secondary | ICD-10-CM | POA: Insufficient documentation

## 2012-01-08 DIAGNOSIS — I517 Cardiomegaly: Secondary | ICD-10-CM | POA: Insufficient documentation

## 2012-01-08 LAB — BASIC METABOLIC PANEL
BUN: 14 mg/dL (ref 6–23)
CO2: 27 mEq/L (ref 19–32)
Calcium: 8.7 mg/dL (ref 8.4–10.5)
Chloride: 106 mEq/L (ref 96–112)
Creatinine, Ser: 0.9 mg/dL (ref 0.4–1.5)
GFR: 87.39 mL/min (ref >60.00)
Glucose, Bld: 97 mg/dL (ref 70–99)
Potassium: 3.9 mEq/L (ref 3.5–5.1)
Sodium: 140 mEq/L (ref 135–145)

## 2012-01-08 LAB — HEPATIC FUNCTION PANEL
ALT: 24 U/L (ref 0–53)
AST: 23 U/L (ref 0–37)
Albumin: 3.9 g/dL (ref 3.5–5.2)
Alkaline Phosphatase: 74 U/L (ref 39–117)
Bilirubin, Direct: 0 mg/dL (ref 0.0–0.3)
Total Bilirubin: 0.6 mg/dL (ref 0.3–1.2)
Total Protein: 7.2 g/dL (ref 6.0–8.3)

## 2012-01-08 LAB — LIPID PANEL
Cholesterol: 223 mg/dL — ABNORMAL HIGH (ref 0–200)
HDL: 53.9 mg/dL (ref >39.00)
Total CHOL/HDL Ratio: 4
Triglycerides: 104 mg/dL (ref 0.0–149.0)
VLDL: 20.8 mg/dL (ref 0.0–40.0)

## 2012-01-08 LAB — LDL CHOLESTEROL, DIRECT: Direct LDL: 138 mg/dL

## 2012-01-08 NOTE — Progress Notes (Signed)
Echocardiogram performed.  

## 2012-01-08 NOTE — Patient Instructions (Addendum)
Your physician wants you to follow-up in: 6 MONTHS WITH DR [facility] You will receive a reminder letter in the mail two months in advance. If you don't receive a letter, please call our office to schedule the follow-up appointment.   Your physician has requested that you have an echocardiogram. Echocardiography is a painless test that uses sound waves to create images of your heart. It provides your doctor with information about the size and shape of your heart and how well your heart's chambers and valves are working. This procedure takes approximately one hour. There are no restrictions for this procedure. SCHEDULE IN ONE YEAR  Your physician recommends that you HAVE LAB WORK TODAY

## 2012-01-10 NOTE — Assessment & Plan Note (Signed)
BP appears to be reasonably controlled on ramipril and amlodipine.  

## 2012-01-10 NOTE — Progress Notes (Signed)
Patient ID: Christopher [name], male   DOB: 1950-10-16, 61 y.o.   MRN: [id number] PCP: Dr. Perrin Maltese  61 yo with history of mitral valve prolapse and prior mitral valve endocarditis with moderate to severe mitral regurgitation presents for cardiology followup.  His mitral regurgitation is being closely followed with yearly echoes.  I reviewed his echo from today.  EF 55-60% with partial flail posterior leaflet and at least moderate eccentric anteriorly directed MR.  The LV is upper normal in size.  There does not seem to be significant change from last year's study.    Symptomatically, patient is doing well.  He has a physical job and is able to work without any exertional dyspnea.  He says that he could walk for > 1 mile.  No syncope.  Rare palpitations.  No chest pain.    ECG: NSR, LAFB, RBBB  Labs (7/12): LDL 128  PMH: 1. HTN 2. H/o sinusitis 3. Nephrolithiasis 4. Mitral valve disease: Baseline mitral valve prolapse.  Had MV endocarditis (Strep viridans) in 4/09.  Has had moderate to severe mitral regurgitation.  Echo (7/12): EF 60%, upper normal LV size (EDD 55 mm, ESD 35 mm), moderate (grade II) diastolic dysfunction with E/medial e' < 15, moderate to possibly severe eccentric MR with mitral valve prolapse and partial flail posterior leaflet, mild LAE. Echo (7/13): EF 55-60%, upper normal LV size, grade II diastolic dysfunction with E/e' < 15, IVC normal, flail segment of posterior leaflet with at least moderate anteriorly directed MR, normal RV, unable to measure PA systolic pressure.   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  Occasional ETOH.  No smoking.   FH: Father with CAD, brother with MI at 13.   ROS: All systems reviewed and negative except as per HPI.   Current Outpatient Prescriptions  Medication Sig Dispense Refill  . amLODipine (NORVASC) 5 MG tablet TAKE 1 TABLET BY MOUTH EVERY DAY  90 tablet  0  . cetirizine (ZYRTEC) 10 MG tablet Take 10 mg by mouth daily.        .  Cholecalciferol (VITAMIN D PO) Take by mouth.      . fluticasone (FLONASE) 50 MCG/ACT nasal spray Place 2 sprays into the nose daily.  16 g  2  . ramipril (ALTACE) 10 MG capsule Take 1 capsule (10 mg total) by mouth daily.  90 capsule  3  . VITAMIN E PO Take by mouth.        BP 126/80  Pulse 64  Ht 5\' 10"  (1.778 m)  Wt 94.348 kg (208 lb)  BMI 29.84 kg/m2 General: NAD Neck: No JVD, no thyromegaly or thyroid nodule.  Lungs: Clear to auscultation bilaterally with normal respiratory effort. CV: Nondisplaced PMI.  Heart regular S1/S2, no S3/S4, 3/6 HSM at apex.  No peripheral edema.  No carotid bruit.  Normal pedal pulses.  Abdomen: Soft, nontender, no hepatosplenomegaly, no distention.  Neurologic: Alert and oriented x 3.  Psych: Normal affect. Extremities: No clubbing or cyanosis.

## 2012-01-10 NOTE — Assessment & Plan Note (Signed)
Moderate to possibly severe eccentric MR with MV prolapse and history of MV endocarditis.  I reviewed today's echo.  The LV is not significantly dilated and EF is 55-60%.  E/medial e' is < 15, suggesting that LV end diastolic pressure is not significantly elevated.  Patient does not have exertional dyspnea.  As he is asymptomatic and LV size and function are preserved, will continue to monitor the valve with watchful waiting.  I suspect that at some point in the future he will need valve repair.  Repeat echo in 1 year.  He should take endocarditis prophylaxis with dental work given history of SBE.

## 2012-01-13 ENCOUNTER — Telehealth: Payer: Self-pay | Admitting: Cardiology

## 2012-01-13 NOTE — Telephone Encounter (Signed)
New problem:  Patient calling - test results

## 2012-01-13 NOTE — Telephone Encounter (Signed)
Left message to call back  

## 2012-04-01 ENCOUNTER — Other Ambulatory Visit: Payer: Self-pay | Admitting: Cardiology

## 2012-04-03 ENCOUNTER — Other Ambulatory Visit: Payer: Self-pay | Admitting: Cardiology

## 2012-05-20 ENCOUNTER — Other Ambulatory Visit: Payer: Self-pay | Admitting: Physician Assistant

## 2012-05-20 NOTE — Telephone Encounter (Signed)
Please pull paper chart.  

## 2012-05-20 NOTE — Telephone Encounter (Signed)
Patients chart is at the nurses station in the pa pool pile.  [facility] [id number]

## 2012-05-26 ENCOUNTER — Other Ambulatory Visit: Payer: Self-pay | Admitting: Physician Assistant

## 2012-05-27 NOTE — Telephone Encounter (Signed)
Patient chart pulled to PA provider pool at nurse's station. [id number]

## 2012-07-06 ENCOUNTER — Other Ambulatory Visit: Payer: Self-pay | Admitting: *Deleted

## 2012-07-06 MED ORDER — FLUTICASONE PROPIONATE 50 MCG/ACT NA SUSP: 2.0000 | Freq: Every day | NASAL | Status: DC

## 2012-07-18 ENCOUNTER — Encounter (HOSPITAL_COMMUNITY): Payer: Self-pay | Admitting: Pharmacy Technician

## 2012-07-18 NOTE — Progress Notes (Signed)
Need orders for 1/23 surgery, pre op is 1/16 thanks

## 2012-07-21 ENCOUNTER — Encounter (HOSPITAL_COMMUNITY): Payer: Self-pay

## 2012-07-21 ENCOUNTER — Ambulatory Visit (HOSPITAL_COMMUNITY)
Admission: RE | Admit: 2012-07-21 | Discharge: 2012-07-21 | Disposition: A | Discharge status: Home / Self Care | Payer: BC Managed Care – PPO | Source: Ambulatory Visit | Attending: Orthopedic Surgery | Admitting: Orthopedic Surgery

## 2012-07-21 ENCOUNTER — Encounter (HOSPITAL_COMMUNITY)
Admission: RE | Admit: 2012-07-21 | Discharge: 2012-07-21 | Disposition: A | Discharge status: Home / Self Care | Payer: BC Managed Care – PPO | Source: Ambulatory Visit | Attending: Orthopedic Surgery | Admitting: Orthopedic Surgery

## 2012-07-21 ENCOUNTER — Telehealth: Payer: Self-pay

## 2012-07-21 DIAGNOSIS — M2040 Other hammer toe(s) (acquired), unspecified foot: Secondary | ICD-10-CM | POA: Insufficient documentation

## 2012-07-21 DIAGNOSIS — M6240 Contracture of muscle, unspecified site: Secondary | ICD-10-CM | POA: Insufficient documentation

## 2012-07-21 DIAGNOSIS — I10 Essential (primary) hypertension: Secondary | ICD-10-CM | POA: Insufficient documentation

## 2012-07-21 DIAGNOSIS — Z01812 Encounter for preprocedural laboratory examination: Secondary | ICD-10-CM | POA: Insufficient documentation

## 2012-07-21 HISTORY — DX: Cardiac murmur, unspecified: R01.1

## 2012-07-21 LAB — BASIC METABOLIC PANEL
BUN: 21 mg/dL (ref 6–23)
CO2: 27 mEq/L (ref 19–32)
Calcium: 9 mg/dL (ref 8.4–10.5)
Chloride: 103 mEq/L (ref 96–112)
Creatinine, Ser: 0.96 mg/dL (ref 0.50–1.35)
GFR calc Af Amer: >90 mL/min (ref >90)
GFR calc non Af Amer: 87 mL/min — ABNORMAL LOW (ref >90)
Glucose, Bld: 99 mg/dL (ref 70–99)
Potassium: 3.6 mEq/L (ref 3.5–5.1)
Sodium: 140 mEq/L (ref 135–145)

## 2012-07-21 LAB — SURGICAL PCR SCREEN
MRSA, PCR: NEGATIVE
Staphylococcus aureus: NEGATIVE

## 2012-07-21 NOTE — Progress Notes (Signed)
07-21-12 0910 pt. Has appt. For Presurgical testing today need preop MD order Entry in Epic. Thanks. W. Kennon Portela

## 2012-07-21 NOTE — Patient Instructions (Addendum)
20 Christopher [name]  07/21/2012   Your procedure is scheduled on:   07-28-2012  Report to [facility] at     1030   AM .  Call this number if you have problems the morning of surgery: [phone]  Or Presurgical Testing [phone](Darica Goren)   For Cpap use: Bring mask and tubing only.   Do not eat food:After Midnight.  May have clear liquids:up to 6 Hours before arrival. Nothing after : 0700 AM  Clear liquids include soda, tea, black coffee, apple or grape juice, broth.  Take these medicines the morning of surgery with A SIP OF WATER: Amlodipine. Zyrtec. Use and bring Flonase nasal Spray   Do not wear jewelry, make-up or nail polish.  Do not wear lotions, powders, or perfumes. You may wear deodorant.  Do not shave 12 hours prior to first CHG shower(legs and under arms).(face and neck okay.)  Do not bring valuables to the hospital.  Contacts, dentures or bridgework,body piercing,  may not be worn into surgery.  Leave suitcase in the car. After surgery it may be brought to your room.  For patients admitted to the hospital, checkout time is 11:00 AM the day of discharge.   Patients discharged the day of surgery will not be allowed to drive home. Must have responsible person with you x 24 hours once discharged.  Name and phone number of your driver: Londell Moh Moore,girlfriend [phone] cell  Special Instructions: CHG Shower Use Special Wash: see special instructions.(avoid face and genitals)   Please read over the following fact sheets that you were given: MRSA Information.    Failure to follow these instructions may result in Cancellation of your surgery.   Patient signature_______________________________________________________

## 2012-07-21 NOTE — Pre-Procedure Instructions (Signed)
07-21-12 CXR done today. EKG 7'13-Epic.

## 2012-07-21 NOTE — Telephone Encounter (Signed)
Received a call from Joann at Dr.Bednarz's office stating they need a note stating patient is cleared for foot surgery.States they did receive a note but the note needs to say patient is cleared for surgery.Call Joann at [phone] if you have any questions.Fax note to fax # [phone].Message sent to Dr.McAlhany's nurse.

## 2012-07-22 NOTE — Telephone Encounter (Signed)
See comments on surgical clearance form done 07/19/12. Dr [name] checked box cleared for surgery today.  To HIM to be refaxed.

## 2012-07-22 NOTE — Telephone Encounter (Signed)
Dr. McLean's pt 

## 2012-07-28 ENCOUNTER — Encounter (HOSPITAL_COMMUNITY)
Admission: RE | Disposition: A | Discharge status: Home / Self Care | Payer: Self-pay | Source: Ambulatory Visit | Attending: Orthopedic Surgery

## 2012-07-28 ENCOUNTER — Encounter (HOSPITAL_COMMUNITY): Payer: Self-pay | Admitting: Registered Nurse

## 2012-07-28 ENCOUNTER — Ambulatory Visit (HOSPITAL_COMMUNITY): Payer: BC Managed Care – PPO | Admitting: Registered Nurse

## 2012-07-28 ENCOUNTER — Encounter (HOSPITAL_COMMUNITY): Payer: Self-pay | Admitting: *Deleted

## 2012-07-28 ENCOUNTER — Ambulatory Visit (HOSPITAL_COMMUNITY)
Admission: RE | Admit: 2012-07-28 | Discharge: 2012-07-28 | Disposition: A | Discharge status: Home / Self Care | Payer: BC Managed Care – PPO | Source: Ambulatory Visit | Attending: Orthopedic Surgery | Admitting: Orthopedic Surgery

## 2012-07-28 DIAGNOSIS — G4730 Sleep apnea, unspecified: Secondary | ICD-10-CM | POA: Insufficient documentation

## 2012-07-28 DIAGNOSIS — M2040 Other hammer toe(s) (acquired), unspecified foot: Secondary | ICD-10-CM

## 2012-07-28 DIAGNOSIS — M79609 Pain in unspecified limb: Secondary | ICD-10-CM | POA: Insufficient documentation

## 2012-07-28 DIAGNOSIS — I10 Essential (primary) hypertension: Secondary | ICD-10-CM | POA: Insufficient documentation

## 2012-07-28 HISTORY — PX: AMPUTATION: SHX166

## 2012-07-28 SURGERY — AMPUTATION DIGIT
Anesthesia: General | Site: Toe | Laterality: Right | Wound class: Clean

## 2012-07-28 MED ORDER — FENTANYL CITRATE 0.05 MG/ML IJ SOLN
INTRAMUSCULAR | Status: DC | PRN
  Administered 2012-07-28 (×3): 50 ug via INTRAVENOUS

## 2012-07-28 MED ORDER — CEFAZOLIN SODIUM-DEXTROSE 2-3 GM-% IV SOLR
INTRAVENOUS | Status: AC
  Filled 2012-07-28: qty 50

## 2012-07-28 MED ORDER — ACETAMINOPHEN 10 MG/ML IV SOLN
INTRAVENOUS | Status: DC | PRN
  Administered 2012-07-28: 1000 mg via INTRAVENOUS

## 2012-07-28 MED ORDER — CEFAZOLIN SODIUM-DEXTROSE 2-3 GM-% IV SOLR: 2.0000 g | INTRAVENOUS | Status: DC

## 2012-07-28 MED ORDER — ONDANSETRON HCL 4 MG/2ML IJ SOLN
INTRAMUSCULAR | Status: DC | PRN
  Administered 2012-07-28: 4 mg via INTRAVENOUS

## 2012-07-28 MED ORDER — GLYCOPYRROLATE 0.2 MG/ML IJ SOLN
INTRAMUSCULAR | Status: DC | PRN
  Administered 2012-07-28: 0.2 mg via INTRAVENOUS

## 2012-07-28 MED ORDER — MIDAZOLAM HCL 5 MG/5ML IJ SOLN
INTRAMUSCULAR | Status: DC | PRN
  Administered 2012-07-28: 2 mg via INTRAVENOUS

## 2012-07-28 MED ORDER — LACTATED RINGERS IV SOLN: INTRAVENOUS | Status: DC

## 2012-07-28 MED ORDER — HYDROCODONE-ACETAMINOPHEN 5-325 MG PO TABS: 1.0000 | ORAL_TABLET | Freq: Four times a day (QID) | ORAL | Status: DC | PRN

## 2012-07-28 MED ORDER — CEFAZOLIN SODIUM-DEXTROSE 2-3 GM-% IV SOLR
2.0000 g | INTRAVENOUS | Status: AC
  Administered 2012-07-28: 2 g via INTRAVENOUS

## 2012-07-28 MED ORDER — FENTANYL CITRATE 0.05 MG/ML IJ SOLN: 25.0000 ug | INTRAMUSCULAR | Status: DC | PRN

## 2012-07-28 MED ORDER — LIDOCAINE HCL (CARDIAC) 20 MG/ML IV SOLN
INTRAVENOUS | Status: DC | PRN
  Administered 2012-07-28: 100 mg via INTRAVENOUS

## 2012-07-28 MED ORDER — ACETAMINOPHEN 10 MG/ML IV SOLN
INTRAVENOUS | Status: AC
  Filled 2012-07-28: qty 100

## 2012-07-28 MED ORDER — CHLORHEXIDINE GLUCONATE 4 % EX LIQD: 60.0000 mL | Freq: Once | CUTANEOUS | Status: DC

## 2012-07-28 MED ORDER — PROPOFOL 10 MG/ML IV BOLUS
INTRAVENOUS | Status: DC | PRN
  Administered 2012-07-28: 200 mg via INTRAVENOUS

## 2012-07-28 MED ORDER — BUPIVACAINE HCL (PF) 0.5 % IJ SOLN
INTRAMUSCULAR | Status: AC
  Filled 2012-07-28: qty 30

## 2012-07-28 MED ORDER — SODIUM CHLORIDE 0.9 % IV SOLN: INTRAVENOUS | Status: DC

## 2012-07-28 MED ORDER — LACTATED RINGERS IV SOLN
INTRAVENOUS | Status: DC | PRN
  Administered 2012-07-28: 12:00:00 via INTRAVENOUS

## 2012-07-28 MED ORDER — 0.9 % SODIUM CHLORIDE (POUR BTL) OPTIME
TOPICAL | Status: DC | PRN
  Administered 2012-07-28: 1000 mL

## 2012-07-28 MED ORDER — PROMETHAZINE HCL 25 MG/ML IJ SOLN: 6.2500 mg | INTRAMUSCULAR | Status: DC | PRN

## 2012-07-28 SURGICAL SUPPLY — 28 items
BAG SPEC THK2 15X12 ZIP CLS (MISCELLANEOUS) ×1
BAG ZIPLOCK 12X15 (MISCELLANEOUS) ×2 IMPLANT
BANDAGE ELASTIC 4 VELCRO ST LF (GAUZE/BANDAGES/DRESSINGS) ×1 IMPLANT
BLADE SURG 15 STRL LF DISP TIS (BLADE) ×2 IMPLANT
BLADE SURG 15 STRL SS (BLADE) ×2
BLADE SURG SZ10 CARB STEEL (BLADE) ×2 IMPLANT
CLOTH BEACON ORANGE TIMEOUT ST (SAFETY) ×2 IMPLANT
ELECT REM PT RETURN 9FT ADLT (ELECTROSURGICAL) ×2
ELECTRODE REM PT RTRN 9FT ADLT (ELECTROSURGICAL) ×1 IMPLANT
GAUZE XEROFORM 1X8 LF (GAUZE/BANDAGES/DRESSINGS) ×1 IMPLANT
GLOVE BIOGEL PI IND STRL 8 (GLOVE) ×1 IMPLANT
GLOVE BIOGEL PI INDICATOR 8 (GLOVE) ×1
GLOVE ECLIPSE 8.0 STRL XLNG CF (GLOVE) ×2 IMPLANT
GLOVE SURG SS PI 8.0 STRL IVOR (GLOVE) ×2 IMPLANT
GOWN PREVENTION PLUS XLARGE (GOWN DISPOSABLE) ×2 IMPLANT
GOWN STRL REIN XL XLG (GOWN DISPOSABLE) ×2 IMPLANT
KIT BASIN OR (CUSTOM PROCEDURE TRAY) ×2 IMPLANT
MANIFOLD NEPTUNE II (INSTRUMENTS) ×2 IMPLANT
NS IRRIG 1000ML POUR BTL (IV SOLUTION) ×2 IMPLANT
PACK LOWER EXTREMITY WL (CUSTOM PROCEDURE TRAY) ×2 IMPLANT
PAD CAST 4YDX4 CTTN HI CHSV (CAST SUPPLIES) ×1 IMPLANT
PADDING CAST COTTON 4X4 STRL (CAST SUPPLIES) ×2
POSITIONER SURGICAL ARM (MISCELLANEOUS) ×2 IMPLANT
SPONGE GAUZE 4X4 12PLY (GAUZE/BANDAGES/DRESSINGS) ×3 IMPLANT
SUT ETHILON 3 0 PS 1 (SUTURE) ×2 IMPLANT
SUT ETHILON 4 0 PS 2 18 (SUTURE) ×1 IMPLANT
TOWEL OR 17X26 10 PK STRL BLUE (TOWEL DISPOSABLE) ×2 IMPLANT
TRAY PREP A LATEX SAFE STRL (SET/KITS/TRAYS/PACK) ×1 IMPLANT

## 2012-07-28 NOTE — Preoperative (Signed)
Beta Blockers   Reason not to administer Beta Blockers:Not Applicable 

## 2012-07-28 NOTE — Transfer of Care (Signed)
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

## 2012-07-28 NOTE — H&P (Signed)
  H&P documentation: Placed to be scanned history and physical exam in chart.  -History and Physical Reviewed  -Patient has been re-examined  -No change in the plan of care  Monish Haliburton A  

## 2012-07-28 NOTE — Anesthesia Preprocedure Evaluation (Addendum)
Anesthesia Evaluation  Patient identified by MRN, date of birth, ID band Patient awake    Reviewed: Allergy & Precautions, H&P , NPO status , Patient's Chart, lab work & pertinent test results  Airway Mallampati: II TM Distance: >3 FB Neck ROM: Full    Dental  (+) Teeth Intact and Dental Advisory Given   Pulmonary sleep apnea ,  breath sounds clear to auscultation        Cardiovascular hypertension, + Valvular Problems/Murmurs MR Rhythm:Regular Rate:Normal + Systolic murmurs    Neuro/Psych negative neurological ROS  negative psych ROS   GI/Hepatic negative GI ROS, Neg liver ROS,   Endo/Other  negative endocrine ROS  Renal/GU negative Renal ROS  negative genitourinary   Musculoskeletal negative musculoskeletal ROS (+)   Abdominal   Peds  Hematology negative hematology ROS (+)   Anesthesia Other Findings   Reproductive/Obstetrics negative OB ROS                           Anesthesia Physical Anesthesia Plan  ASA: II  Anesthesia Plan: General   Post-op Pain Management:    Induction: Intravenous  Airway Management Planned: LMA  Additional Equipment:   Intra-op Plan:   Post-operative Plan: Extubation in OR  Informed Consent: I have reviewed the patients History and Physical, chart, labs and discussed the procedure including the risks, benefits and alternatives for the proposed anesthesia with the patient or authorized representative who has indicated his/her understanding and acceptance.   Dental advisory given  Plan Discussed with: CRNA  Anesthesia Plan Comments:         Anesthesia Quick Evaluation

## 2012-07-28 NOTE — Brief Op Note (Signed)
07/28/2012  2:17 PM  PATIENT:  Christopher [name]  62 y.o. male  PRE-OPERATIVE DIAGNOSIS:  RIGHT TIGHT GASTROC AND SECOND HAMMER TOE  POST-OPERATIVE DIAGNOSIS:  RIGHT TIGHT GASTROC AND SECOND HAMMER TOE  PROCEDURE:  Procedure(s) (LRB) with comments: AMPUTATION DIGIT (Right) - 2nd toe  SURGEON:  Surgeon(s) and Role:    * [name], MD - Primary  PHYSICIAN ASSISTANT: Rexene Edison, PAC   ASSISTANTS: Rexene Edison, PAC    ANESTHESIA:   general  EBL:     BLOOD ADMINISTERED:none  DRAINS: none   LOCAL MEDICATIONS USED:  NONE  SPECIMEN:  No Specimen  DISPOSITION OF SPECIMEN:  N/A  COUNTS:  YES  TOURNIQUET:  * No tourniquets in log *  DICTATION: .Other Dictation: Dictation Number [id number]  PLAN OF CARE: Discharge to home after PACU  PATIENT DISPOSITION:  PACU - hemodynamically stable.   Delay start of Pharmacological VTE agent (>24hrs) due to surgical blood loss or risk of bleeding: no

## 2012-07-29 ENCOUNTER — Encounter (HOSPITAL_COMMUNITY): Payer: Self-pay | Admitting: Orthopedic Surgery

## 2012-07-29 NOTE — Anesthesia Postprocedure Evaluation (Signed)
Anesthesia Post Note  Patient: Christopher [name]  Procedure(s) Performed: Procedure(s) (LRB): AMPUTATION DIGIT (Right)  Anesthesia type: General  Patient location: PACU  Post pain: Pain level controlled  Post assessment: Post-op Vital signs reviewed  Last Vitals:  Filed Vitals:   07/28/12 1636  BP: 148/81  Pulse: 57  Temp: 36.1 C  Resp: 18    Post vital signs: Reviewed  Level of consciousness: sedated  Complications: No apparent anesthesia complications

## 2012-07-29 NOTE — Op Note (Signed)
NAME:  CHER, EGNOR NO.:  [id number]  MEDICAL RECORD NO.:  [id number]  LOCATION:  [facility]                         FACILITY:  [facility]  PHYSICIAN:  Leonides Grills, M.D.     DATE OF BIRTH:  06-23-1951  DATE OF PROCEDURE:  07/28/2012 DATE OF DISCHARGE:  07/28/2012                              OPERATIVE REPORT   PREOPERATIVE DIAGNOSIS:  Right second hammertoe.  POSTOPERATIVE DIAGNOSES:  Right second hammertoe.  OPERATION:  Right second toe amputation through metatarsophalangeal joint.  ANESTHESIA:  General.  SURGEON:  Leonides Grills, M.D.  ASSISTANT:  Richardean Canal, PA-C.  ESTIMATED BLOOD LOSS:  Minimal.  TOURNIQUET:  None.  COMPLICATIONS:  None.  DISPOSITION:  To PR.  INDICATION:  This is a 62 year old gentleman who had previous hammertoe reconstruction, but developed the painful recurrent hammertoe that crossed over and was bothered to the point was interfering with his life.  We discussed different options to include reconstruction versus amputation, he has chosen the amputation.  We also discussed possibly doing a gastroc slide at the same time, but he has elected to proceed just with the amputation.  He was consented for the above procedure. All risks, infection, vessel injury, wound healing problems, persistent pain, worse pain, prolonged recovery, were all explained.  Questions were encouraged and answered.  OPERATION:  The patient was brought to the operating room and placed in supine position after adequate general anesthesia administered as well as Ancef 1 g IV piggyback.  The right lower extremity was prepped and draped in a sterile manner.  No tourniquet was used.  A racket-shaped incision based dorsally was then made.  Dissection was carried down directly to bone.  Soft tissue was elevated off the proximal phalanx down to the MTP joint.  The toe was then disarticulated through the MTP joint.  This was the right second toe.  The area was  copiously irrigated with normal saline.  Skin was closed with 4-0 nylon stitch.  Sterile dressing was applied.  Hard- sole shoe was applied.  The patient was stable to PR.     Leonides Grills, M.D.     PB/MEDQ  D:  07/28/2012  T:  07/29/2012  Job:  [id number]

## 2012-08-01 ENCOUNTER — Ambulatory Visit: Payer: BC Managed Care – PPO | Admitting: Cardiology

## 2012-08-04 ENCOUNTER — Encounter: Payer: Self-pay | Admitting: Cardiology

## 2012-08-04 ENCOUNTER — Ambulatory Visit (INDEPENDENT_AMBULATORY_CARE_PROVIDER_SITE_OTHER): Payer: BC Managed Care – PPO | Admitting: Cardiology

## 2012-08-04 VITALS — BP 132/74 | HR 81 | Ht 70.0 in | Wt 214.0 lb

## 2012-08-04 DIAGNOSIS — I10 Essential (primary) hypertension: Secondary | ICD-10-CM

## 2012-08-04 DIAGNOSIS — I340 Nonrheumatic mitral (valve) insufficiency: Secondary | ICD-10-CM

## 2012-08-04 DIAGNOSIS — I059 Rheumatic mitral valve disease, unspecified: Secondary | ICD-10-CM

## 2012-08-04 NOTE — Patient Instructions (Addendum)
Your physician recommends that you schedule a follow-up appointment in: 4 WEEKS WITH DR  [facility]  Your physician has requested that you have a TEE. During a TEE, sound waves are used to create images of your heart. It provides your doctor with information about the size and shape of your heart and how well your heart's chambers and valves are working. In this test, a transducer is attached to the end of a flexible tube that's guided down your throat and into your esophagus (the tube leading from you mouth to your stomach) to get a more detailed image of your heart. You are not awake for the procedure. Please see the instruction sheet given to you today. For further information please visit [email or web address]   Your physician has requested that you have a cardiac catheterization. Cardiac catheterization is used to diagnose and/or treat various heart conditions. Doctors may recommend this procedure for a number of different reasons. The most common reason is to evaluate chest pain. Chest pain can be a symptom of coronary artery disease (CAD), and cardiac catheterization can show whether plaque is narrowing or blocking your heart's arteries. This procedure is also used to evaluate the valves, as well as measure the blood flow and oxygen levels in different parts of your heart. For further information please visit [email or web address] Please follow instruction sheet, as given.   Your physician recommends that you HAVE LAB WORK TODAY

## 2012-08-04 NOTE — Progress Notes (Signed)
Patient ID: Christopher [name], male   DOB: 04/29/1951, 61 y.o.   MRN: [id number] PCP: Dr. Guest  61 yo with history of mitral valve prolapse and prior mitral valve endocarditis with moderate to severe mitral regurgitation presents for cardiology followup.  His mitral regurgitation is being closely followed with yearly echoes. Last echo in 7/13 showed EF 55-60% with partial flail posterior leaflet and at least moderate but probably severe eccentric anteriorly directed MR.  The LV was upper normal in size.    Since last appointment, patient has noted the onset of exertional dyspnea.  He is short of breath walking up a flight of steps or going up a hill.  No problems on flat ground.  No orthopnea or PND.  He is still working at [name].  No chest pain.  No lightheadedness or syncope.    ECG: NSR, RBBB, inferior Qs  Labs (7/12): LDL 128 Labs (7/13): K 3.9, creatinine 0.9, HDL 54, LDL 138  PMH: 1. HTN 2. H/o sinusitis 3. Nephrolithiasis 4. Mitral valve disease: Baseline mitral valve prolapse.  Had MV endocarditis (Strep viridans) in 4/09.  Has had moderate to severe mitral regurgitation.  Echo (7/12): EF 60%, upper normal LV size (EDD 55 mm, ESD 35 mm), moderate (grade II) diastolic dysfunction with E/medial e' < 15, moderate to possibly severe eccentric MR with mitral valve prolapse and partial flail posterior leaflet, mild LAE. Echo (7/13): EF 55-60%, upper normal LV size, grade II diastolic dysfunction with E/e' < 15, IVC normal, flail segment of posterior leaflet with at least moderate and probably severe anteriorly directed MR, normal RV, unable to measure PA systolic pressure.   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  Divorced.  Occasional ETOH.  No smoking.   FH: Father with CAD, brother with MI at 56.   ROS: All systems reviewed and negative except as per HPI.   Current Outpatient Prescriptions  Medication Sig Dispense Refill  . amLODipine (NORVASC) 5 MG tablet Take 5 mg by mouth every  morning.      . aspirin EC 81 MG tablet Take 81 mg by mouth daily.      . cetirizine (ZYRTEC) 10 MG tablet Take 10 mg by mouth daily.        . Cholecalciferol (VITAMIN D) 2000 UNITS tablet Take 2,000 Units by mouth daily.      . fluticasone (FLONASE) 50 MCG/ACT nasal spray Place 2 sprays into the nose daily.  48 g  11  . HYDROcodone-acetaminophen (NORCO) 5-325 MG per tablet Take 1 tablet by mouth every 6 (six) hours as needed for pain. One to two tabs every 4-6 hours for pain  30 tablet  0  . ramipril (ALTACE) 10 MG capsule Take 10 mg by mouth every morning.        BP 132/74  Pulse 81  Ht 5' 10" (1.778 m)  Wt 214 lb (97.07 kg)  BMI 30.71 kg/m2 General: NAD Neck: No JVD, no thyromegaly or thyroid nodule.  Lungs: Clear to auscultation bilaterally with normal respiratory effort. CV: Nondisplaced PMI.  Heart regular S1/S2, no S3/S4, 3/6 HSM at apex.  No peripheral edema.  No carotid bruit.  Normal pedal pulses.  Abdomen: Soft, nontender, no hepatosplenomegaly, no distention.  Neurologic: Alert and oriented x 3.  Psych: Normal affect. Extremities: No clubbing or cyanosis.   Assessment/Plan:  Mitral valve regurgitation  Possibly severe eccentric MR with MV prolapse and history of MV endocarditis.On last echo, the LV was not significantly dilated and   EF was 55-60%. Patient has developed dyspnea with moderate exertion.  This is new for him since last appointment.  I am concerned that he has progressed to symptomatic severe MR, suggesting a need for valve repair.   - Confirm severe MR via TEE - Will need right and left heart cath using left groin access as minimally invasive MV may be an option. - After TEE and cath, refer to [facility] for evaluation for minimal invasive MV repair.  HYPERTENSION BP appears to be reasonably controlled on ramipril and amlodipine.   Christopher [name] 08/04/2012    

## 2012-08-05 ENCOUNTER — Encounter (HOSPITAL_COMMUNITY): Payer: Self-pay | Admitting: Pharmacy Technician

## 2012-08-05 ENCOUNTER — Encounter: Payer: Self-pay | Admitting: *Deleted

## 2012-08-05 LAB — BASIC METABOLIC PANEL
BUN: 12 mg/dL (ref 6–23)
CO2: 31 mEq/L (ref 19–32)
Calcium: 8.8 mg/dL (ref 8.4–10.5)
Chloride: 103 mEq/L (ref 96–112)
Creatinine, Ser: 0.9 mg/dL (ref 0.4–1.5)
GFR: 91.77 mL/min (ref >60.00)
Glucose, Bld: 98 mg/dL (ref 70–99)
Potassium: 3.7 mEq/L (ref 3.5–5.1)
Sodium: 138 mEq/L (ref 135–145)

## 2012-08-05 LAB — CBC WITH DIFFERENTIAL/PLATELET
Basophils Absolute: 0 10*3/uL (ref 0.0–0.1)
Basophils Relative: 0.3 % (ref 0.0–3.0)
Eosinophils Absolute: 0.2 10*3/uL (ref 0.0–0.7)
Eosinophils Relative: 3.8 % (ref 0.0–5.0)
HCT: 44.7 % (ref 39.0–52.0)
Hemoglobin: 15.3 g/dL (ref 13.0–17.0)
Lymphocytes Relative: 24.8 % (ref 12.0–46.0)
Lymphs Abs: 1.4 10*3/uL (ref 0.7–4.0)
MCHC: 34.3 g/dL (ref 30.0–36.0)
MCV: 85.8 fl (ref 78.0–100.0)
Monocytes Absolute: 0.4 10*3/uL (ref 0.1–1.0)
Monocytes Relative: 6.5 % (ref 3.0–12.0)
Neutro Abs: 3.7 10*3/uL (ref 1.4–7.7)
Neutrophils Relative %: 64.6 % (ref 43.0–77.0)
Platelets: 153 10*3/uL (ref 150.0–400.0)
RBC: 5.21 Mil/uL (ref 4.22–5.81)
RDW: 13.7 % (ref 11.5–14.6)
WBC: 5.8 10*3/uL (ref 4.5–10.5)

## 2012-08-05 LAB — PROTIME-INR
INR: 1.1 ratio — ABNORMAL HIGH (ref 0.8–1.0)
Prothrombin Time: 11.7 s (ref 10.2–12.4)

## 2012-08-11 ENCOUNTER — Encounter (HOSPITAL_BASED_OUTPATIENT_CLINIC_OR_DEPARTMENT_OTHER): Payer: Self-pay

## 2012-08-11 ENCOUNTER — Inpatient Hospital Stay (HOSPITAL_BASED_OUTPATIENT_CLINIC_OR_DEPARTMENT_OTHER): Admit: 2012-08-11 | Payer: Self-pay | Admitting: Cardiology

## 2012-08-11 ENCOUNTER — Other Ambulatory Visit: Payer: Self-pay | Admitting: Cardiology

## 2012-08-11 ENCOUNTER — Ambulatory Visit (HOSPITAL_COMMUNITY): Admit: 2012-08-11 | Payer: Self-pay | Admitting: Cardiology

## 2012-08-11 ENCOUNTER — Encounter (HOSPITAL_COMMUNITY): Payer: Self-pay

## 2012-08-11 DIAGNOSIS — I340 Nonrheumatic mitral (valve) insufficiency: Secondary | ICD-10-CM

## 2012-08-11 SURGERY — ECHOCARDIOGRAM, TRANSESOPHAGEAL
Anesthesia: Moderate Sedation

## 2012-08-11 SURGERY — JV LEFT AND RIGHT HEART CATHETERIZATION WITH CORONARY ANGIOGRAM

## 2012-08-12 ENCOUNTER — Ambulatory Visit (HOSPITAL_COMMUNITY)
Admission: RE | Admit: 2012-08-12 | Discharge: 2012-08-12 | Disposition: A | Discharge status: Home / Self Care | Payer: BC Managed Care – PPO | Source: Ambulatory Visit | Attending: Cardiology | Admitting: Cardiology

## 2012-08-12 ENCOUNTER — Encounter (HOSPITAL_COMMUNITY): Payer: Self-pay | Admitting: *Deleted

## 2012-08-12 ENCOUNTER — Encounter (HOSPITAL_COMMUNITY)
Admission: RE | Disposition: A | Discharge status: Home / Self Care | Payer: Self-pay | Source: Ambulatory Visit | Attending: Cardiology

## 2012-08-12 DIAGNOSIS — I2510 Atherosclerotic heart disease of native coronary artery without angina pectoris: Secondary | ICD-10-CM

## 2012-08-12 DIAGNOSIS — I340 Nonrheumatic mitral (valve) insufficiency: Secondary | ICD-10-CM

## 2012-08-12 DIAGNOSIS — I059 Rheumatic mitral valve disease, unspecified: Secondary | ICD-10-CM

## 2012-08-12 DIAGNOSIS — Z8249 Family history of ischemic heart disease and other diseases of the circulatory system: Secondary | ICD-10-CM | POA: Insufficient documentation

## 2012-08-12 DIAGNOSIS — I739 Peripheral vascular disease, unspecified: Secondary | ICD-10-CM

## 2012-08-12 DIAGNOSIS — I10 Essential (primary) hypertension: Secondary | ICD-10-CM | POA: Insufficient documentation

## 2012-08-12 HISTORY — PX: LEFT AND RIGHT HEART CATHETERIZATION WITH CORONARY ANGIOGRAM: SHX5449

## 2012-08-12 HISTORY — PX: TEE WITHOUT CARDIOVERSION: SHX5443

## 2012-08-12 LAB — POCT I-STAT 3, ART BLOOD GAS (G3+)
Bicarbonate: 26.7 mEq/L — ABNORMAL HIGH (ref 20.0–24.0)
O2 Saturation: 98 %
TCO2: 28 mmol/L (ref 0–100)
pCO2 arterial: 49 mmHg — ABNORMAL HIGH (ref 35.0–45.0)
pH, Arterial: 7.344 — ABNORMAL LOW (ref 7.350–7.450)
pO2, Arterial: 107 mmHg — ABNORMAL HIGH (ref 80.0–100.0)

## 2012-08-12 LAB — POCT I-STAT 3, VENOUS BLOOD GAS (G3P V)
Acid-base deficit: 3 mmol/L — ABNORMAL HIGH (ref 0.0–2.0)
Bicarbonate: 23.9 mEq/L (ref 20.0–24.0)
O2 Saturation: 70 %
TCO2: 25 mmol/L (ref 0–100)
pCO2, Ven: 47 mmHg (ref 45.0–50.0)
pH, Ven: 7.315 — ABNORMAL HIGH (ref 7.250–7.300)
pO2, Ven: 40 mmHg (ref 30.0–45.0)

## 2012-08-12 SURGERY — ECHOCARDIOGRAM, TRANSESOPHAGEAL
Anesthesia: Moderate Sedation

## 2012-08-12 SURGERY — LEFT AND RIGHT HEART CATHETERIZATION WITH CORONARY ANGIOGRAM
Anesthesia: LOCAL

## 2012-08-12 SURGERY — JV LEFT AND RIGHT HEART CATHETERIZATION WITH CORONARY/GRAFT ANGIOGRAM
Anesthesia: Moderate Sedation

## 2012-08-12 MED ORDER — LIDOCAINE HCL (PF) 1 % IJ SOLN
INTRAMUSCULAR | Status: AC
  Filled 2012-08-12: qty 30

## 2012-08-12 MED ORDER — FENTANYL CITRATE 0.05 MG/ML IJ SOLN
INTRAMUSCULAR | Status: AC
  Filled 2012-08-12: qty 2

## 2012-08-12 MED ORDER — SODIUM CHLORIDE 0.9 % IV SOLN
INTRAVENOUS | Status: DC
  Administered 2012-08-12: 13:00:00 via INTRAVENOUS

## 2012-08-12 MED ORDER — MIDAZOLAM HCL 2 MG/2ML IJ SOLN
INTRAMUSCULAR | Status: AC
  Filled 2012-08-12: qty 2

## 2012-08-12 MED ORDER — ACETAMINOPHEN 325 MG PO TABS: 650.0000 mg | ORAL_TABLET | ORAL | Status: DC | PRN

## 2012-08-12 MED ORDER — HEPARIN (PORCINE) IN NACL 2-0.9 UNIT/ML-% IJ SOLN
INTRAMUSCULAR | Status: AC
  Filled 2012-08-12: qty 1500

## 2012-08-12 MED ORDER — SODIUM CHLORIDE 0.9 % IV SOLN: INTRAVENOUS | Status: AC

## 2012-08-12 MED ORDER — MIDAZOLAM HCL 10 MG/2ML IJ SOLN
INTRAMUSCULAR | Status: DC | PRN
  Administered 2012-08-12: 2 mg via INTRAVENOUS
  Administered 2012-08-12: 1 mg via INTRAVENOUS

## 2012-08-12 MED ORDER — ONDANSETRON HCL 4 MG/2ML IJ SOLN: 4.0000 mg | Freq: Four times a day (QID) | INTRAMUSCULAR | Status: DC | PRN

## 2012-08-12 MED ORDER — FENTANYL CITRATE 0.05 MG/ML IJ SOLN
INTRAMUSCULAR | Status: DC | PRN
  Administered 2012-08-12 (×2): 25 ug via INTRAVENOUS

## 2012-08-12 MED ORDER — BUTAMBEN-TETRACAINE-BENZOCAINE 2-2-14 % EX AERO
INHALATION_SPRAY | CUTANEOUS | Status: DC | PRN
  Administered 2012-08-12: 2 via TOPICAL

## 2012-08-12 MED ORDER — MIDAZOLAM HCL 5 MG/ML IJ SOLN
INTRAMUSCULAR | Status: AC
  Filled 2012-08-12: qty 2

## 2012-08-12 NOTE — H&P (View-Only) (Signed)
Patient ID: Christopher [name], male   DOB: 03/24/1951, 62 y.o.   MRN: [id number] PCP: Dr. Guest  62 yo with history of mitral valve prolapse and prior mitral valve endocarditis with moderate to severe mitral regurgitation presents for cardiology followup.  His mitral regurgitation is being closely followed with yearly echoes. Last echo in 7/13 showed EF 55-60% with partial flail posterior leaflet and at least moderate but probably severe eccentric anteriorly directed MR.  The LV was upper normal in size.    Since last appointment, patient has noted the onset of exertional dyspnea.  He is short of breath walking up a flight of steps or going up a hill.  No problems on flat ground.  No orthopnea or PND.  He is still working at [name].  No chest pain.  No lightheadedness or syncope.    ECG: NSR, RBBB, inferior Qs  Labs (7/12): LDL 128 Labs (7/13): K 3.9, creatinine 0.9, HDL 54, LDL 138  PMH: 1. HTN 2. H/o sinusitis 3. Nephrolithiasis 4. Mitral valve disease: Baseline mitral valve prolapse.  Had MV endocarditis (Strep viridans) in 4/09.  Has had moderate to severe mitral regurgitation.  Echo (7/12): EF 60%, upper normal LV size (EDD 55 mm, ESD 35 mm), moderate (grade II) diastolic dysfunction with E/medial e' < 15, moderate to possibly severe eccentric MR with mitral valve prolapse and partial flail posterior leaflet, mild LAE. Echo (7/13): EF 55-60%, upper normal LV size, grade II diastolic dysfunction with E/e' < 15, IVC normal, flail segment of posterior leaflet with at least moderate and probably severe anteriorly directed MR, normal RV, unable to measure PA systolic pressure.   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  Divorced.  Occasional ETOH.  No smoking.   FH: Father with CAD, brother with MI at 56.   ROS: All systems reviewed and negative except as per HPI.   Current Outpatient Prescriptions  Medication Sig Dispense Refill  . amLODipine (NORVASC) 5 MG tablet Take 5 mg by mouth every  morning.      . aspirin EC 81 MG tablet Take 81 mg by mouth daily.      . cetirizine (ZYRTEC) 10 MG tablet Take 10 mg by mouth daily.        . Cholecalciferol (VITAMIN D) 2000 UNITS tablet Take 2,000 Units by mouth daily.      . fluticasone (FLONASE) 50 MCG/ACT nasal spray Place 2 sprays into the nose daily.  48 g  11  . HYDROcodone-acetaminophen (NORCO) 5-325 MG per tablet Take 1 tablet by mouth every 6 (six) hours as needed for pain. One to two tabs every 4-6 hours for pain  30 tablet  0  . ramipril (ALTACE) 10 MG capsule Take 10 mg by mouth every morning.        BP 132/74  Pulse 81  Ht 5' 10" (1.778 m)  Wt 214 lb (97.07 kg)  BMI 30.71 kg/m2 General: NAD Neck: No JVD, no thyromegaly or thyroid nodule.  Lungs: Clear to auscultation bilaterally with normal respiratory effort. CV: Nondisplaced PMI.  Heart regular S1/S2, no S3/S4, 3/6 HSM at apex.  No peripheral edema.  No carotid bruit.  Normal pedal pulses.  Abdomen: Soft, nontender, no hepatosplenomegaly, no distention.  Neurologic: Alert and oriented x 3.  Psych: Normal affect. Extremities: No clubbing or cyanosis.   Assessment/Plan:  Mitral valve regurgitation  Possibly severe eccentric MR with MV prolapse and history of MV endocarditis.On last echo, the LV was not significantly dilated and   EF was 55-60%. Patient has developed dyspnea with moderate exertion.  This is new for him since last appointment.  I am concerned that he has progressed to symptomatic severe MR, suggesting a need for valve repair.   - Confirm severe MR via TEE - Will need right and left heart cath using left groin access as minimally invasive MV may be an option. - After TEE and cath, refer to [facility] for evaluation for minimal invasive MV repair.  HYPERTENSION BP appears to be reasonably controlled on ramipril and amlodipine.   [name] 08/04/2012    

## 2012-08-12 NOTE — Interval H&P Note (Signed)
History and Physical Interval Note:  08/12/2012 2:29 PM  Christopher [name]  has presented today for surgery, with the diagnosis of mitral valve disease  The various methods of treatment have been discussed with the patient and family. After consideration of risks, benefits and other options for treatment, the patient has consented to  Procedure(s) (LRB) with comments: TRANSESOPHAGEAL ECHOCARDIOGRAM (TEE) (N/A) as a surgical intervention .  The patient's history has been reviewed, patient examined, no change in status, stable for surgery.  I have reviewed the patient's chart and labs.  Questions were answered to the patient's satisfaction.     [name]

## 2012-08-12 NOTE — CV Procedure (Signed)
   Cardiac Catheterization Procedure Note  Name: Christopher [name] MRN: [id number] DOB: 1950/11/29  Procedure: Right Heart Cath, Left Heart Cath, Selective Coronary Angiography, LV angiography, Descending aortogram with iliac/femoral runoff.   Indication:    Procedural Details: The left groin was prepped, draped, and anesthetized with 1% lidocaine. Using the modified Seldinger technique a 5 French sheath was placed in the left femoral artery and a 7 French sheath was placed in the left femoral vein. A Swan-Ganz catheter was used for the right heart catheterization. Standard protocol was followed for recording of right heart pressures and sampling of oxygen saturations. Fick cardiac output was calculated. Standard Judkins catheters were used for selective coronary angiography and left ventriculography. There were no immediate procedural complications. The patient was transferred to the post catheterization recovery area for further monitoring.  Procedural Findings: Hemodynamics (mmHg) RA mean 7 RV 27/5 PA 27/12 PCWP mean 12 (V-waves not particularly prominent but difficult to assess with frequent PVCs) LV 130/18 AO 128/69  Oxygen saturations: PA 70% AO 90%  Cardiac Output (Fick) 6.87 L/min  Cardiac Index (Fick) 3.2   Coronary angiography: Coronary dominance: right  Left mainstem: Long left main without significant.   Left anterior descending (LAD): Luminal irregularities.   Left circumflex (LCx): There was a large ramus with 40% stenosis just after a branch point in the proximal vessel.  The LCx had mild luminal irregularities.   Right coronary artery (RCA): There is a 95% mid RCA stenosis.  The RCA is a large vessel.   Left ventriculography: Left ventricular systolic function is normal, LVEF is estimated at 55-60%.  There is basal to mid inferior hypokinesis.  3-4+ mitral regurgitation noted.   Descending Aortography with Runoff: The descending aorta was normal in caliber with no  significant obstructive disease.  The iliac system and common femoral artery on the right did not have obstructive disease.   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  Patient needs MV repair and grafting of the RCA.  He is clinically stable with exertional dyspnea only.  No symptoms at rest and no chest pain.  I am referring him to [facility] today, will arrange appointment next week.  Given the CAD, I am going to have him start metoprolol 25 mg bid and atorvastatin 20 mg daily.     [name] 08/12/2012, 4:17 PM

## 2012-08-12 NOTE — Progress Notes (Signed)
  Echocardiogram Echocardiogram Transesophageal has been performed.  Christopher [name] 08/12/2012, 3:14 PM

## 2012-08-12 NOTE — CV Procedure (Signed)
Procedure: TEE  Indication: Mitral regurgitation.   Sedation: Versed 3 mg IV, Fentanyl 50 mcg IV  Findings: Normal LV size and systolic function, EF 55-60%.  Normal RV size and systolic function.  The mitral valve has flail of the P2 and probably P1 segments.  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  The tips of the mitral valve are moderately thickened.  MR appears to be eccentric and severe.  It is severe by PISA measurement though I did not find flow reversal in the pulmonary veins (perhaps because the MR is very eccentric).   The patient will proceed to catheterization.  No complications.   [name] 08/12/2012 3:01 PM

## 2012-08-15 ENCOUNTER — Encounter (HOSPITAL_COMMUNITY): Payer: Self-pay | Admitting: Cardiology

## 2012-08-20 ENCOUNTER — Other Ambulatory Visit: Payer: Self-pay

## 2012-08-22 ENCOUNTER — Institutional Professional Consult (permissible substitution) (INDEPENDENT_AMBULATORY_CARE_PROVIDER_SITE_OTHER): Payer: BC Managed Care – PPO | Admitting: Thoracic Surgery (Cardiothoracic Vascular Surgery)

## 2012-08-22 ENCOUNTER — Encounter: Payer: Self-pay | Admitting: Thoracic Surgery (Cardiothoracic Vascular Surgery)

## 2012-08-22 VITALS — BP 141/78 | HR 68 | Resp 20 | Ht 70.0 in | Wt 220.0 lb

## 2012-08-22 DIAGNOSIS — I2510 Atherosclerotic heart disease of native coronary artery without angina pectoris: Secondary | ICD-10-CM

## 2012-08-22 DIAGNOSIS — I340 Nonrheumatic mitral (valve) insufficiency: Secondary | ICD-10-CM

## 2012-08-22 DIAGNOSIS — I330 Acute and subacute infective endocarditis: Secondary | ICD-10-CM

## 2012-08-22 DIAGNOSIS — I059 Rheumatic mitral valve disease, unspecified: Secondary | ICD-10-CM

## 2012-08-22 NOTE — Progress Notes (Signed)
[address]            [name] [address]          [phone]     CARDIOTHORACIC SURGERY CONSULTATION REPORT  Referring Provider is [name], MD PCP is No primary provider on file.  Chief Complaint  Patient presents with  . Mitral Regurgitation    Referral from Dr [name] for severe Mitral regurgitation, Cardiac Cath  08/12/12, Echo 08/12/12    HPI:  Patient is a 62 year old male with history of mitral valve prolapse and mitral regurgitation dating back many years. The patient states that he was first told he had a heart murmur at least 15 years ago. 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 Blood cultures grew strep viridans and transesophageal echocardiogram at the time confirmed what appeared to be vegetation on the mitral valve with moderate to severe mitral regurgitation. He has been followed carefully ever since in the [facility] cardiology office and recently return to see Dr. [name] complaining of recent onset exertional shortness of breath.  He underwent transesophageal echocardiogram and left and right heart catheterization by Dr. [name] last week. This confirmed the presence of mitral valve prolapse with flail segment of the posterior leaflet and severe mitral regurgitation. Left ventricular function appears reasonably well preserved with ejection fraction estimated 55-60%.  Cardiac catheterization is notable for single-vessel coronary artery disease with high-grade 95% stenosis of the mid right coronary artery. The patient has been referred to discuss treatment options.  The patient describes gradual onset of exertional shortness of breath and occasional palpitations. He denies resting shortness of breath, PND, orthopnea, or lower extremity edema. She's not had any significant chest pain or chest tightness either with activity or at rest. He gets short of breath only with moderate physical activity, such as walking up a  flight of stairs, probably functional class II.  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  Past Medical History  Diagnosis Date  . Sleep apnea     NPSG 01/21/06- AHI 40.7/hr  . Hypertension   . Endocarditis 2009    Strep viridans  . Rhinitis, allergic   . Heart murmur   . Mitral valve regurgitation 01/09/2011    Mitral valve prolapse with flail segment of posterior leaflet and severe MR by TEE, remote h/o bacterial endocarditis   . Coronary artery disease 08/22/2012    95% RCA stenosis  . Subacute bacterial endocarditis 03/22/2008    Strep viridans    Past Surgical History  Procedure Laterality Date  . Septoplasty      Dr. [name]  . Uvulopalatopharyngoplasty    . Tonsillectomy    . Foot surgery      BILATERAL TOES  . Amputation  07/28/2012    Procedure: AMPUTATION DIGIT;  Surgeon: [name], MD;  Location: [facility] ORS;  Service: Orthopedics;  Laterality: Right;  2nd toe  . Tee without cardioversion N/A 08/12/2012    Procedure: TRANSESOPHAGEAL ECHOCARDIOGRAM (TEE);  Surgeon: [name], MD;  Location: [facility] ENDOSCOPY;  Service: Cardiovascular;  Laterality: N/A;  . Appendectomy    . Cardiac catheterization      Family History  Problem Relation Age of Onset  . Heart disease Father   . Diabetes Mother   . Stroke Mother   .  Cancer Father   . Diabetes Father     History   Social History  . Marital Status: Divorced    Spouse Name: N/A    Number of Children: N/A  . Years of Education: N/A   Occupational History  . Works at [employer]    Social History Main Topics  . Smoking status: Former Smoker    Types: Cigarettes    Quit date: 07/06/1978  . Smokeless tobacco: Not on file  . Alcohol Use: Yes     Comment: OCC.  . Drug Use: No  . Sexually Active: Yes   Other Topics Concern  . Not on file   Social History Narrative  . No narrative on  file    Current Outpatient Prescriptions  Medication Sig Dispense Refill  . amLODipine (NORVASC) 5 MG tablet Take 5 mg by mouth every morning.      Marland Kitchen aspirin EC 81 MG tablet Take 81 mg by mouth daily.      Marland Kitchen atorvastatin (LIPITOR) 20 MG tablet Take 20 mg by mouth daily.      . cetirizine (ZYRTEC) 10 MG tablet Take 10 mg by mouth daily.        . Cholecalciferol (VITAMIN D) 2000 UNITS CAPS Take 1 capsule by mouth daily.      . fluticasone (FLONASE) 50 MCG/ACT nasal spray Place 2 sprays into the nose daily.  48 g  11  . metoprolol tartrate (LOPRESSOR) 25 MG tablet Take 25 mg by mouth 2 (two) times daily.      . ramipril (ALTACE) 10 MG capsule Take 10 mg by mouth every morning.       No current facility-administered medications for this visit.    No Known Allergies    Review of Systems:   General:  normal appetite, normal energy, + 15 lb weight gain, no weight loss, no fever  Cardiac:  no chest pain with exertion, no chest pain at rest, + SOB with moderate exertion, no resting SOB, no PND, no orthopnea, + palpitations, no arrhythmia, no atrial fibrillation, no LE edema, no dizzy spells, no syncope  Respiratory:  + exertional shortness of breath, no home oxygen, no productive cough, no dry cough, no bronchitis, no wheezing, no hemoptysis, no asthma, no pain with inspiration or cough, + sleep apnea, + CPAP at night  GI:   no difficulty swallowing, no reflux, no frequent heartburn, no hiatal hernia, no abdominal pain, no constipation, no diarrhea, no hematochezia, no hematemesis, no melena  GU:   no dysuria,  no frequency, no urinary tract infection, no hematuria, no enlarged prostate, + kidney stones, no kidney disease  Vascular:  no pain suggestive of claudication, no pain in feet, no leg cramps, no varicose veins, no DVT, no non-healing foot ulcer  Neuro:   no stroke, no TIA's, no seizures, no headaches, no temporary blindness one eye,  no slurred speech, no peripheral neuropathy, no chronic  pain, no instability of gait, no memory/cognitive dysfunction  Musculoskeletal: no arthritis, no joint swelling, no myalgias, mild difficulty walking due to recent surgery, good mobility   Skin:   no rash, no itching, no skin infections, no pressure sores or ulcerations  Psych:   + anxiety, no depression, + nervousness, no unusual recent stress  Eyes:   no blurry vision, no floaters, no recent vision changes, + wears glasses or contacts  ENT:   no hearing loss, no loose or painful teeth, no dentures, last saw dentist Sept 2013 - goes every 6  months  Hematologic:  no easy bruising, no abnormal bleeding, no clotting disorder, no frequent epistaxis  Endocrine:  no diabetes, does not check CBG's at home     Physical Exam:   BP 141/78  Pulse 68  Resp 20  Ht 5\' 10"  (1.778 m)  Wt 220 lb (99.791 kg)  BMI 31.57 kg/m2  SpO2 98%  General:    well-appearing  HEENT:  Unremarkable   Neck:   no JVD, no bruits, no adenopathy   Chest:   clear to auscultation, symmetrical breath sounds, no wheezes, no rhonchi   CV:   RRR, grade II-III/VI systolic murmur best at RLSB  Abdomen:  soft, non-tender, no masses   Extremities:  warm, well-perfused, pulses palpable, no LE edema  Rectal/GU  Deferred  Neuro:   Grossly non-focal and symmetrical throughout  Skin:   Clean and dry, no rashes, no breakdown   Diagnostic Tests:  Transesophageal Echocardiography  Patient: Emmett, Bracknell MR #: [id number] Study Date: 08/12/2012 Gender: M Age: 27 Height: 177.8cm Weight: 97.3kg BSA: 2.92m^2 Pt. Status: Room: [facility]  ADMITTING [name] ATTENDING [name], Dalton Anson Fret, Dalton PERFORMING [name], Dalton SONOGRAPHER Dewitt Hoes, RDCS cc:  ------------------------------------------------------------ LV EF: 55% - 60%  ------------------------------------------------------------ Indications: Mitral regurgitation 424.0.  ------------------------------------------------------------ Study  Conclusions  - Left ventricle: The cavity size was normal. Wall thickness was normal. Systolic function was normal. The estimated ejection fraction was in the range of 55% to 60%. Wall motion was normal; there were no regional wall motion abnormalities. - Aortic valve: There was no stenosis. Trivial regurgitation. - Aorta: Normal caliber aorta with minimal plaque. - Mitral valve: The mitral valve has flail of the P2 and probably P1 segments. 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 The tips of the mitral valve are moderately thickened. MR appears to be eccentric and severe. It is severe by PISA measurement though I did not find flow reversal in the pulmonary veins (perhaps because the MR is very eccentric). Effective regurgitant orifice: 0.53cm^2 (PISA). Regurgitant volume: (PISA). - Left atrium: The atrium was mildly to moderately dilated. No evidence of thrombus in the atrial cavity or appendage. - Right ventricle: The cavity size was normal. Systolic function was normal. - Right atrium: No evidence of thrombus in the atrial cavity or appendage. - Pulmonic valve: No evidence of vegetation. Transesophageal echocardiography. 2D and color Doppler. Height: Height: 177.8cm. Height: 70in. Weight: Weight: 97.3kg. Weight: 214lb. Body mass index: BMI: 30.8kg/m^2. Body surface area: BSA: 2.24m^2. Blood pressure: 145/57. Patient status: Inpatient. Location: Endoscopy.  ------------------------------------------------------------  ------------------------------------------------------------ Left ventricle: The cavity size was normal. Wall thickness was normal. Systolic function was normal. The estimated ejection fraction was in the range of 55% to 60%. Wall motion was normal; there were no regional wall motion abnormalities.  ------------------------------------------------------------ Aortic valve: Trileaflet. Doppler: There was no stenosis. Trivial  regurgitation.  ------------------------------------------------------------ Aorta: Normal caliber aorta with minimal plaque.  ------------------------------------------------------------ Mitral valve: The mitral valve has flail of the P2 and probably P1 segments. 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 The tips of the mitral valve are moderately thickened. MR appears to be eccentric and severe. It is severe by PISA measurement though I did not find flow reversal in the pulmonary veins (perhaps because the MR is very eccentric). Doppler: There was no evidence for stenosis.  ------------------------------------------------------------ Left atrium: The atrium was mildly to moderately dilated. No evidence of thrombus in the atrial cavity or appendage.  ------------------------------------------------------------  Right ventricle: The cavity size was normal. Systolic function was normal.  ------------------------------------------------------------ Pulmonic valve: Structurally normal valve. Cusp separation was normal. No evidence of vegetation. Doppler: Trivial regurgitation.  ------------------------------------------------------------ Tricuspid valve: Doppler: Trivial regurgitation.  ------------------------------------------------------------ Right atrium: The atrium was normal in size. No evidence of thrombus in the atrial cavity or appendage.  ------------------------------------------------------------ Pericardium: There was no pericardial effusion.  ------------------------------------------------------------ Post procedure conclusions Ascending Aorta:  - Normal caliber aorta with minimal plaque.  ------------------------------------------------------------  Doppler measurements Normal Mitral valve Regurg 38.5 cm/s ------ alias vel, PISA Max regurg 658 cm/s ------ vel Regurg VTI 226 cm ------ ERO, PISA 0.53 cm^2 ------ Regurg 120 ml  ------ vol, PISA  ------------------------------------------------------------ Prepared and Electronically Authenticated by  [name] 2014-02-13T13:15:[id number]    Cardiac Catheterization Procedure Note  Name: AJA BOLANDER  MRN: [id number]  DOB: July 04, 1951  Procedure: Right Heart Cath, Left Heart Cath, Selective Coronary Angiography, LV angiography, Descending aortogram with iliac/femoral runoff.  Indication:  Procedural Details: The left groin was prepped, draped, and anesthetized with 1% lidocaine. Using the modified Seldinger technique a 5 French sheath was placed in the left femoral artery and a 7 French sheath was placed in the left femoral vein. A Swan-Ganz catheter was used for the right heart catheterization. Standard protocol was followed for recording of right heart pressures and sampling of oxygen saturations. Fick cardiac output was calculated. Standard Judkins catheters were used for selective coronary angiography and left ventriculography. There were no immediate procedural complications. The patient was transferred to the post catheterization recovery area for further monitoring.  Procedural Findings:  Hemodynamics (mmHg)  RA mean 7  RV 27/5  PA 27/12  PCWP mean 12 (V-waves not particularly prominent but difficult to assess with frequent PVCs)  LV 130/18  AO 128/69  Oxygen saturations:  PA 70%  AO 90%  Cardiac Output (Fick) 6.87 L/min  Cardiac Index (Fick) 3.2  Coronary angiography:  Coronary dominance: right  Left mainstem: Long left main without significant.  Left anterior descending (LAD): Luminal irregularities.  Left circumflex (LCx): There was a large ramus with 40% stenosis just after a branch point in the proximal vessel. The LCx had mild luminal irregularities.  Right coronary artery (RCA): There is a 95% mid RCA stenosis. The RCA is a large vessel.  Left ventriculography: Left ventricular systolic function is normal, LVEF is estimated at 55-60%. There  is basal to mid inferior hypokinesis. 3-4+ mitral regurgitation noted.  Descending Aortography with Runoff: The descending aorta was normal in caliber with no significant obstructive disease. The iliac system and common femoral artery on the right did not have obstructive disease.  Final Conclusions: Severe mitral regurgitation with severe 95% stenosis in the mid RCA. Patient needs MV repair and grafting of the RCA. He is clinically stable with exertional dyspnea only. No symptoms at rest and no chest pain. I am referring him to [facility] today, will arrange appointment next week. Given the CAD, I am going to have him start metoprolol 25 mg bid and atorvastatin 20 mg daily.  [name]  08/12/2012, 4:17 PM         Impression:  The patient has severe symptomatic mitral regurgitation with long-standing mitral valve prolapse, history of Strep viridans bacterial endocarditis in 2009, and recent onset of exertional shortness of breath.  Left ventricular function appears fairly well preserved. Cardiac catheterization is notable for single-vessel coronary artery disease including high-grade 95% stenosis of the mid right coronary artery. This coronary artery lesion is quite focal  and appears well suited for percutaneous coronary intervention.  Options include mitral valve repair with coronary artery bypass grafting via a conventional sternotomy or alternatively percutaneous coronary intervention of the right coronary artery with delayed minimally invasive approach for mitral valve repair.   Plan:  The rationale for elective mitral valve repair surgery has been explained, including a comparison between surgery and continued medical therapy with close follow-up.  The likelihood of successful and durable valve repair has been discussed with particular reference to the findings of their recent echocardiogram.  Based upon these findings and previous experience, I have quoted them a greater than 90 percent likelihood  of successful valve repair.  Alternative surgical approaches have been discussed, including a comparison between conventional sternotomy and minimally-invasive techniques.  We discussed the significance of the high-grade stenosis of the right coronary artery and associated treatment alternatives. The relative risks and benefits of each have been reviewed as they pertain to the patient's specific circumstances, and all of their questions have been addressed.  The patient prefers to avoid a conventional sternotomy approach for surgery of possible and is very interested in the possibility of undergoing percutaneous coronary mention with stenting of the right coronary artery followed by delayed minimally invasive mitral valve repair.  We will discuss with Dr. [name] to make arrangements for consultation with one of the interventional cardiologists in the near future. Timing of surgery will depend upon whether or not the patient's right coronary artery is treated using a drug-eluting stent, although I think the patient could easily tolerate waiting as much as three months if necessary.       Salvatore Decent. Cornelius Moras, MD 08/22/2012 6:48 PM

## 2012-08-22 NOTE — Patient Instructions (Addendum)
Schedule PCI and stent of right coronary artery as soon as practical.  Endocarditis is a potentially serious infection of heart valves or inside lining of the heart.  It occurs more commonly in patients with diseased heart valves (such as patient's with aortic or mitral valve disease) and in patients who have undergone heart valve repair or replacement.  You had endocarditis in 2009.  Certain surgical and dental procedures may put you at risk, such as dental cleaning, other dental procedures, or any surgery involving the respiratory, urinary, gastrointestinal tract, gallbladder or prostate gland.   To minimize your chances for develooping endocarditis, maintain good oral health and seek prompt medical attention for any infections involving the mouth, teeth, gums, skin or urinary tract.  Always notify your doctor or dentist about your underlying heart valve condition before having any invasive procedures. You will need to take antibiotics before certain procedures.

## 2012-08-23 ENCOUNTER — Telehealth: Payer: Self-pay | Admitting: Cardiology

## 2012-08-23 MED ORDER — CLOPIDOGREL BISULFATE 75 MG PO TABS: ORAL_TABLET | ORAL | Status: DC

## 2012-08-23 NOTE — Telephone Encounter (Signed)
Spoke with pt. PCI scheduled for 08/25/12 7:30am Dr Copper. Pt to start plavix 300mg  today then plavix 75mg  daily per Dr [name].

## 2012-08-23 NOTE — Telephone Encounter (Signed)
New Problem:    Patient called in wanting to speak with you about scheduling a stent placement procedure.  Please call back.

## 2012-08-25 ENCOUNTER — Other Ambulatory Visit: Payer: Self-pay | Admitting: Cardiology

## 2012-08-25 ENCOUNTER — Ambulatory Visit (HOSPITAL_COMMUNITY)
Admission: RE | Admit: 2012-08-25 | Discharge: 2012-08-25 | Disposition: A | Discharge status: Home / Self Care | Payer: BC Managed Care – PPO | Source: Ambulatory Visit | Attending: Cardiovascular Disease | Admitting: Cardiovascular Disease

## 2012-08-25 ENCOUNTER — Encounter (HOSPITAL_COMMUNITY)
Admission: RE | Disposition: A | Discharge status: Home / Self Care | Payer: Self-pay | Source: Ambulatory Visit | Attending: Cardiovascular Disease

## 2012-08-25 ENCOUNTER — Encounter (HOSPITAL_COMMUNITY): Payer: Self-pay | Admitting: Nurse Practitioner

## 2012-08-25 DIAGNOSIS — G4730 Sleep apnea, unspecified: Secondary | ICD-10-CM | POA: Diagnosis present

## 2012-08-25 DIAGNOSIS — I10 Essential (primary) hypertension: Secondary | ICD-10-CM | POA: Diagnosis present

## 2012-08-25 DIAGNOSIS — I340 Nonrheumatic mitral (valve) insufficiency: Secondary | ICD-10-CM | POA: Diagnosis present

## 2012-08-25 DIAGNOSIS — I2510 Atherosclerotic heart disease of native coronary artery without angina pectoris: Secondary | ICD-10-CM

## 2012-08-25 DIAGNOSIS — I200 Unstable angina: Secondary | ICD-10-CM

## 2012-08-25 DIAGNOSIS — I059 Rheumatic mitral valve disease, unspecified: Secondary | ICD-10-CM | POA: Insufficient documentation

## 2012-08-25 HISTORY — DX: Nonrheumatic mitral (valve) insufficiency: I34.0

## 2012-08-25 HISTORY — PX: PERCUTANEOUS CORONARY STENT INTERVENTION (PCI-S): SHX5485

## 2012-08-25 LAB — CBC
HCT: 42.9 % (ref 39.0–52.0)
Hemoglobin: 14.6 g/dL (ref 13.0–17.0)
MCH: 29.2 pg (ref 26.0–34.0)
MCHC: 34 g/dL (ref 30.0–36.0)
MCV: 85.8 fL (ref 78.0–100.0)
Platelets: 142 10*3/uL — ABNORMAL LOW (ref 150–400)
RBC: 5 MIL/uL (ref 4.22–5.81)
RDW: 13.6 % (ref 11.5–15.5)
WBC: 5.3 10*3/uL (ref 4.0–10.5)

## 2012-08-25 LAB — BASIC METABOLIC PANEL
BUN: 17 mg/dL (ref 6–23)
CO2: 26 mEq/L (ref 19–32)
Calcium: 9.1 mg/dL (ref 8.4–10.5)
Chloride: 105 mEq/L (ref 96–112)
Creatinine, Ser: 0.91 mg/dL (ref 0.50–1.35)
GFR calc Af Amer: >90 mL/min (ref >90)
GFR calc non Af Amer: 88 mL/min — ABNORMAL LOW (ref >90)
Glucose, Bld: 118 mg/dL — ABNORMAL HIGH (ref 70–99)
Potassium: 3.7 mEq/L (ref 3.5–5.1)
Sodium: 142 mEq/L (ref 135–145)

## 2012-08-25 LAB — POCT ACTIVATED CLOTTING TIME: Activated Clotting Time: 475 seconds

## 2012-08-25 LAB — PROTIME-INR
INR: 1.02 (ref 0.00–1.49)
Prothrombin Time: 13.3 seconds (ref 11.6–15.2)

## 2012-08-25 SURGERY — PERCUTANEOUS CORONARY STENT INTERVENTION (PCI-S)
Anesthesia: Moderate Sedation

## 2012-08-25 SURGERY — PERCUTANEOUS CORONARY STENT INTERVENTION (PCI-S)
Anesthesia: LOCAL

## 2012-08-25 MED ORDER — SODIUM CHLORIDE 0.9 % IV SOLN
INTRAVENOUS | Status: DC
  Administered 2012-08-25: 07:00:00 via INTRAVENOUS

## 2012-08-25 MED ORDER — MIDAZOLAM HCL 2 MG/2ML IJ SOLN
INTRAMUSCULAR | Status: AC
  Filled 2012-08-25: qty 2

## 2012-08-25 MED ORDER — FENTANYL CITRATE 0.05 MG/ML IJ SOLN
INTRAMUSCULAR | Status: AC
  Filled 2012-08-25: qty 2

## 2012-08-25 MED ORDER — SODIUM CHLORIDE 0.9 % IJ SOLN: 3.0000 mL | Freq: Two times a day (BID) | INTRAMUSCULAR | Status: DC

## 2012-08-25 MED ORDER — ASPIRIN 81 MG PO CHEW
324.0000 mg | CHEWABLE_TABLET | ORAL | Status: AC
  Administered 2012-08-25: 324 mg via ORAL

## 2012-08-25 MED ORDER — SODIUM CHLORIDE 0.9 % IV SOLN: 250.0000 mL | INTRAVENOUS | Status: DC | PRN

## 2012-08-25 MED ORDER — ASPIRIN 81 MG PO CHEW
CHEWABLE_TABLET | ORAL | Status: AC
  Filled 2012-08-25: qty 4

## 2012-08-25 MED ORDER — ONDANSETRON HCL 4 MG/2ML IJ SOLN: 4.0000 mg | Freq: Four times a day (QID) | INTRAMUSCULAR | Status: DC | PRN

## 2012-08-25 MED ORDER — NITROGLYCERIN 0.4 MG SL SUBL: 0.4000 mg | SUBLINGUAL_TABLET | SUBLINGUAL | Status: DC | PRN

## 2012-08-25 MED ORDER — NITROGLYCERIN 1 MG/10 ML FOR IR/CATH LAB
INTRA_ARTERIAL | Status: AC
  Filled 2012-08-25: qty 10

## 2012-08-25 MED ORDER — SODIUM CHLORIDE 0.9 % IJ SOLN
3.0000 mL | INTRAMUSCULAR | Status: DC | PRN
Start: 2012-08-25 — End: 2012-08-25

## 2012-08-25 MED ORDER — BIVALIRUDIN 250 MG IV SOLR
INTRAVENOUS | Status: AC
  Filled 2012-08-25: qty 250

## 2012-08-25 MED ORDER — CLOPIDOGREL BISULFATE 75 MG PO TABS: 75.0000 mg | ORAL_TABLET | Freq: Every day | ORAL | Status: DC

## 2012-08-25 MED ORDER — CLOPIDOGREL BISULFATE 75 MG PO TABS
ORAL_TABLET | ORAL | Status: AC
  Filled 2012-08-25: qty 1

## 2012-08-25 MED ORDER — ACETAMINOPHEN 325 MG PO TABS: 650.0000 mg | ORAL_TABLET | ORAL | Status: DC | PRN

## 2012-08-25 MED ORDER — HEPARIN (PORCINE) IN NACL 2-0.9 UNIT/ML-% IJ SOLN
INTRAMUSCULAR | Status: AC
  Filled 2012-08-25: qty 1000

## 2012-08-25 MED ORDER — OXYCODONE-ACETAMINOPHEN 5-325 MG PO TABS: 1.0000 | ORAL_TABLET | ORAL | Status: DC | PRN

## 2012-08-25 MED ORDER — LIDOCAINE HCL (PF) 1 % IJ SOLN
INTRAMUSCULAR | Status: AC
  Filled 2012-08-25: qty 30

## 2012-08-25 MED ORDER — VERAPAMIL HCL 2.5 MG/ML IV SOLN
INTRAVENOUS | Status: AC
  Filled 2012-08-25: qty 2

## 2012-08-25 MED ORDER — CLOPIDOGREL BISULFATE 75 MG PO TABS
75.0000 mg | ORAL_TABLET | Freq: Once | ORAL | Status: AC
  Administered 2012-08-25: 75 mg via ORAL

## 2012-08-25 MED ORDER — SODIUM CHLORIDE 0.9 % IV SOLN: 1.0000 mL/kg/h | INTRAVENOUS | Status: DC

## 2012-08-25 NOTE — Progress Notes (Signed)
Utilization Review Completed Eureka Valdes J. Khyre Germond, RN, BSN, NCM [phone]  

## 2012-08-25 NOTE — H&P (View-Only) (Signed)
Patient ID: Christopher [name], male   DOB: 08-Jan-1951, 62 y.o.   MRN: [id number] PCP: Dr. Perrin Maltese  63 yo with history of mitral valve prolapse and prior mitral valve endocarditis with moderate to severe mitral regurgitation presents for cardiology followup.  His mitral regurgitation is being closely followed with yearly echoes. Last echo in 7/13 showed EF 55-60% with partial flail posterior leaflet and at least moderate but probably severe eccentric anteriorly directed MR.  The LV was upper normal in size.    Since last appointment, patient has noted the onset of exertional dyspnea.  He is short of breath walking up a flight of steps or going up a hill.  No problems on flat ground.  No orthopnea or PND.  He is still working at [employer].  No chest pain.  No lightheadedness or syncope.    ECG: NSR, RBBB, inferior Qs  Labs (7/12): LDL 128 Labs (7/13): K 3.9, creatinine 0.9, HDL 54, LDL 138  PMH: 1. HTN 2. H/o sinusitis 3. Nephrolithiasis 4. Mitral valve disease: Baseline mitral valve prolapse.  Had MV endocarditis (Strep viridans) in 4/09.  Has had moderate to severe mitral regurgitation.  Echo (7/12): EF 60%, upper normal LV size (EDD 55 mm, ESD 35 mm), moderate (grade II) diastolic dysfunction with E/medial e' < 15, moderate to possibly severe eccentric MR with mitral valve prolapse and partial flail posterior leaflet, mild LAE. Echo (7/13): EF 55-60%, upper normal LV size, grade II diastolic dysfunction with E/e' < 15, IVC normal, flail segment of posterior leaflet with at least moderate and probably severe anteriorly directed MR, normal RV, unable to measure PA systolic pressure.   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  Occasional ETOH.  No smoking.   FH: Father with CAD, brother with MI at 82.   ROS: All systems reviewed and negative except as per HPI.   Current Outpatient Prescriptions  Medication Sig Dispense Refill  . amLODipine (NORVASC) 5 MG tablet Take 5 mg by mouth every  morning.      Marland Kitchen aspirin EC 81 MG tablet Take 81 mg by mouth daily.      . cetirizine (ZYRTEC) 10 MG tablet Take 10 mg by mouth daily.        . Cholecalciferol (VITAMIN D) 2000 UNITS tablet Take 2,000 Units by mouth daily.      . fluticasone (FLONASE) 50 MCG/ACT nasal spray Place 2 sprays into the nose daily.  48 g  11  . HYDROcodone-acetaminophen (NORCO) 5-325 MG per tablet Take 1 tablet by mouth every 6 (six) hours as needed for pain. One to two tabs every 4-6 hours for pain  30 tablet  0  . ramipril (ALTACE) 10 MG capsule Take 10 mg by mouth every morning.        BP 132/74  Pulse 81  Ht 5\' 10"  (1.778 m)  Wt 214 lb (97.07 kg)  BMI 30.71 kg/m2 General: NAD Neck: No JVD, no thyromegaly or thyroid nodule.  Lungs: Clear to auscultation bilaterally with normal respiratory effort. CV: Nondisplaced PMI.  Heart regular S1/S2, no S3/S4, 3/6 HSM at apex.  No peripheral edema.  No carotid bruit.  Normal pedal pulses.  Abdomen: Soft, nontender, no hepatosplenomegaly, no distention.  Neurologic: Alert and oriented x 3.  Psych: Normal affect. Extremities: No clubbing or cyanosis.   Assessment/Plan:  Mitral valve regurgitation  Possibly severe eccentric MR with MV prolapse and history of MV endocarditis.On last echo, the LV was not significantly dilated and  EF was 55-60%. Patient has developed dyspnea with moderate exertion.  This is new for him since last appointment.  I am concerned that he has progressed to symptomatic severe MR, suggesting a need for valve repair.   - Confirm severe MR via TEE - Will need right and left heart cath using left groin access as minimally invasive MV may be an option. - After TEE and cath, refer to [facility] for evaluation for minimal invasive MV repair.  HYPERTENSION BP appears to be reasonably controlled on ramipril and amlodipine.   [name] 08/04/2012

## 2012-08-25 NOTE — Progress Notes (Signed)
Patient post cath right radial level zero no complication denies pain. Ambulated hall tolerated well.

## 2012-08-25 NOTE — CV Procedure (Signed)
   CARDIAC CATH NOTE  Name: Christopher [name] MRN: [id number] DOB: September 05, 1950  Procedure: PTCA and stenting of the mid-RCA  Indication: Severe single vessel CAD in patient with mitral regurgitation and planned minimally-invasive mitral valve repair.  Procedural Details: The right wrist was prepped, draped, and anesthetized with 1% lidocaine. Using the modified Seldinger technique, a 6 Fr sheath was introduced into the radial artery. 3 mg verapamil was administered through the radial sheath. Weight-based bivalirudin was given for anticoagulation. Once a therapeutic ACT was achieved, a 6 [place] JR4 guide catheter was inserted.  RCA angiography confirmed severe eccentic 95% napkin-ring stenosis of the mid/distal RCA junction. A Cougar coronary guidewire was used to cross the lesion.  The lesion was predilated with a 3.0x12 mm balloon.  The lesion was then stented with a 4.0x15 mm Vision bare-metal stent.  The stent was postdilated with a 4.5 x 12 mm noncompliant balloon.  Following PCI, there was 0% residual stenosis and TIMI-3 flow. Final angiography confirmed an excellent result. The patient tolerated the procedure well. There were no immediate procedural complications. A TR band was used for radial hemostasis. The patient was transferred to the post catheterization recovery area for further monitoring.  Lesion Data: Vessel: RCA/mid Percent stenosis (pre): 95 TIMI-flow (pre):  3 Stent:  4.0x15 mm bare metal Percent stenosis (post): 0 TIMI-flow (post): 3  Conclusions: Successful PCI of the RCA using a bare metal stent  Recommendations: ASA/plavix 30 days, then stop plavix in anticipation of cardiac surgery. As patient was adequately preloaded with plavix and procedure was uncomplicated, will initiate PCI same-day discharge protocol.  Tonny Bollman 08/25/2012, 8:36 AM

## 2012-08-25 NOTE — Progress Notes (Signed)
TR BAND REMOVAL  LOCATION:  right radial  DEFLATED PER PROTOCOL:  yes  TIME BAND OFF / DRESSING APPLIED:   1210   SITE UPON ARRIVAL:   Level 0  SITE AFTER BAND REMOVAL:  Level 0  REVERSE ALLEN'S TEST:    positive  CIRCULATION SENSATION AND MOVEMENT:  Within Normal Limits  yes  COMMENTS:

## 2012-08-25 NOTE — Interval H&P Note (Signed)
History and Physical Interval Note:  08/25/2012 7:35 AM  Christopher [name]  has presented today for surgery, with the diagnosis of CAD  The various methods of treatment have been discussed with the patient and family. After consideration of risks, benefits and other options for treatment, the patient has consented to  Procedure(s): PERCUTANEOUS CORONARY STENT INTERVENTION (PCI-S) (N/A) as a surgical intervention .  The patient's history has been reviewed, patient examined, no change in status, stable for surgery.  I have reviewed the patient's chart and labs.  Questions were answered to the patient's satisfaction.    Pt has undergone cardiac cath and surgical eval by Dr Cornelius Moras. Cath showed severe focal RCA stenosis of 95% in a large, dominant RCA. He plans mini-mitral repair so patient presents today for PCI of the RCA. Will plan on treatment with a bare-metal stent so plavix duration can be limited to 30 days. I have reviewed risks, indication, and alternative to PCI with the patient and his family.   Tonny Bollman

## 2012-08-25 NOTE — Progress Notes (Signed)
Ok, will get him an appt with you in that range.

## 2012-08-25 NOTE — Discharge Summary (Signed)
Patient ID: Christopher [name],  MRN: [id number], DOB/AGE: 12-Jun-1951 62 y.o.  Admit date: 08/25/2012 Discharge date: 08/25/2012  Primary Care Provider: No primary provider on file. Primary Cardiologist: Golden Circle, MD Thoracic Surgery:  C. Cornelius Moras, MD  Discharge Diagnoses Principal Problem:   Coronary artery disease Active Problems:   Unstable angina   Severe mitral regurgitation   HYPERTENSION   SLEEP APNEA  Allergies No Known Allergies  Procedures  Percutaneous Coronary Intervention 08/25/2012  The 95% stenosis in the RCA was successfully stented using a 4.0 x 15 mm Vision BMS. _____________  History of Present Illness  62 y/o male with the above complex problem list.  He has prior h/o endocarditis and mitral regurgitation and has been followed closely as an outpatient.  He was recently seen in clinic and complained of increasing DOE.  TEE was undertaken and revealed normal LV function but eccentric and severe mitral regurgitation with flailing of the P2 and probably P1 segments of the mitral valve and appearance of chord rupture affecting the P2 segment.  Diagnostic catheterization was then undertaken, as it now appeared that patient would require mitral valve repair, and this showed severe stenosis within the RCA and otherwise nonobstructive CAD.  He was subsequently evaluated by thoracic surgery and after discussion, it was agreed upon that patient would undergo percutaneous intervention to the RCA followed by a 30 day course of plavix and then mitral valve repair.  Arrangements were then made for outpatient PCI.  Hospital Course  Pt presented to the [facility] cath lab and underwent successful bare metal stenting of the right coronary artery.  He tolerated procedure well and post-procedure has experienced no recurrence of dyspnea or difficulty with ambulation.  We plan to discharge him home today in good condition.  He has f/u appts arranged with both Drs. [name] and [place] in the coming weeks.   He will remain on plavix for 4 weeks.  Discharge Vitals Blood pressure 141/96, pulse 56, temperature 98.3 F (36.8 C), resp. rate 18, height 5\' 8"  (1.727 m), weight 220 lb (99.791 kg), SpO2 95.00%.  Filed Weights   08/25/12 0621  Weight: 220 lb (99.791 kg)    Labs  CBC  Recent Labs  08/25/12 0600  WBC 5.3  HGB 14.6  HCT 42.9  MCV 85.8  PLT 142*   Basic Metabolic Panel  Recent Labs  08/25/12 0600  NA 142  K 3.7  CL 105  CO2 26  GLUCOSE 118*  BUN 17  CREATININE 0.91  CALCIUM 9.1   Disposition  Pt is being discharged home today in good condition.  Follow-up Plans & Appointments  Follow-up Information   Follow up with [name], MD On 09/02/2012. (2:30 PM)    Contact information:   [address] [place] [address] [phone]       Follow up with [name], MD On 09/19/2012. (10:30 AM)    Contact information:   [address] [employer] [place] [address] [phone]       Discharge Medications    Medication List    TAKE these medications       amLODipine 5 MG tablet  Commonly known as:  NORVASC  Take 5 mg by mouth every morning.     aspirin EC 81 MG tablet  Take 81 mg by mouth daily.     atorvastatin 20 MG tablet  Commonly known as:  LIPITOR  Take 20 mg by mouth daily.  cetirizine 10 MG tablet  Commonly known as:  ZYRTEC  Take 10 mg by mouth daily.     clopidogrel 75 MG tablet  Commonly known as:  PLAVIX  Take 1 tablet (75 mg total) by mouth daily.     fluticasone 50 MCG/ACT nasal spray  Commonly known as:  FLONASE  Place 2 sprays into the nose daily.     metoprolol tartrate 25 MG tablet  Commonly known as:  LOPRESSOR  Take 25 mg by mouth 2 (two) times daily.     nitroGLYCERIN 0.4 MG SL tablet  Commonly known as:  NITROSTAT  Place 1 tablet (0.4 mg total) under the tongue every 5 (five) minutes as needed for chest pain.     ramipril 10 MG capsule  Commonly known  as:  ALTACE  Take 10 mg by mouth every morning.     Vitamin D 2000 UNITS Caps  Take 1 capsule by mouth daily.       Outstanding Labs/Studies  None  Duration of Discharge Encounter   Greater than 30 minutes including physician time.  Signed, Nicolasa Ducking NP 08/25/2012, 4:22 PM

## 2012-08-25 NOTE — Progress Notes (Addendum)
Patient having frequent PVC's/ trigeminy heart rate 49-55 BP 141/96 patient asymptomatic. Dr. [name] notified. I will continue to monitor.

## 2012-08-25 NOTE — Progress Notes (Signed)
[id number] Cardiac Rehab Completed stent discharge educaiton with pt. He voices understanding. Pt for heart surgery in 5-6 weeks, so not appro. for Outpt. CRP. Did discuss with pt our role per-op. And things to expect post op.

## 2012-08-26 MED FILL — Dextrose Inj 5%: INTRAVENOUS | Qty: 50 | Status: AC

## 2012-08-29 NOTE — Progress Notes (Signed)
This has been done.

## 2012-09-02 ENCOUNTER — Ambulatory Visit (INDEPENDENT_AMBULATORY_CARE_PROVIDER_SITE_OTHER): Payer: BC Managed Care – PPO | Admitting: Cardiology

## 2012-09-02 ENCOUNTER — Encounter: Payer: Self-pay | Admitting: Cardiology

## 2012-09-02 VITALS — BP 114/64 | HR 59 | Ht 68.0 in | Wt 217.0 lb

## 2012-09-02 DIAGNOSIS — I10 Essential (primary) hypertension: Secondary | ICD-10-CM

## 2012-09-02 DIAGNOSIS — E785 Hyperlipidemia, unspecified: Secondary | ICD-10-CM

## 2012-09-02 DIAGNOSIS — I2510 Atherosclerotic heart disease of native coronary artery without angina pectoris: Secondary | ICD-10-CM

## 2012-09-02 DIAGNOSIS — I059 Rheumatic mitral valve disease, unspecified: Secondary | ICD-10-CM

## 2012-09-02 DIAGNOSIS — I340 Nonrheumatic mitral (valve) insufficiency: Secondary | ICD-10-CM

## 2012-09-02 MED ORDER — ATORVASTATIN CALCIUM 20 MG PO TABS: 20.0000 mg | ORAL_TABLET | Freq: Every day | ORAL | Status: DC

## 2012-09-02 NOTE — Patient Instructions (Addendum)
Your physician recommends that you return for a FASTING lipid profile/liver profile in 2 months.   Your physician recommends that you schedule a follow-up appointment 2 months after your surgery--late May/early June.

## 2012-09-04 DIAGNOSIS — E1169 Type 2 diabetes mellitus with other specified complication: Secondary | ICD-10-CM | POA: Insufficient documentation

## 2012-09-04 NOTE — Progress Notes (Signed)
Patient ID: Christopher [name], male   DOB: [birth year], 62 y.o.   MRN: [id number] PCP: Dr. Perrin Maltese  62 yo with history of mitral valve prolapse and prior mitral valve endocarditis with severe mitral regurgitation presents for cardiology followup. Last TTE in 7/13 showed EF 55-60% with partial flail posterior leaflet and at least moderate but probably severe eccentric anteriorly directed MR.  The LV was upper normal in size.  Over the last 6 months or so, patient has noted the onset of exertional dyspnea.  He is short of breath walking up a flight of steps or going up a hill.  No problems on flat ground.  No orthopnea or PND.  He is still working at [employer].  No chest pain.  No lightheadedness or syncope.    I took him for TEE, which showed flail P2 and P1 segments of the mitral valve with a ruptured chord.  There was severe MR.  LHC showed 95% mRCA stenosis.  As he wanted a minimally invasive mitral valve repair, arranged for PCI to the mid RCA, which was done using BMS.  He returns after his PCI.  He has been stable symptomatically.   ECG: NSR, LAFB, RBBB  Labs (7/12): LDL 128 Labs (7/13): K 3.9, creatinine 0.9, HDL 54, LDL 133  PMH: 1. HTN 2. H/o sinusitis 3. Nephrolithiasis 4. Mitral valve disease: Baseline mitral valve prolapse.  Had MV endocarditis (Strep viridans) in 4/09.  Has had moderate to severe mitral regurgitation.  Echo (7/12): EF 60%, upper normal LV size (EDD 55 mm, ESD 35 mm), moderate (grade II) diastolic dysfunction with E/medial e' < 15, moderate to possibly severe eccentric MR with mitral valve prolapse and partial flail posterior leaflet, mild LAE. Echo (7/13): EF 55-60%, upper normal LV size, grade II diastolic dysfunction with E/e' < 15, IVC normal, flail segment of posterior leaflet with at least moderate and probably severe anteriorly directed MR, normal RV, unable to measure PA systolic pressure.  TEE (2/14) with EF 55-60%, flail P2 and P1 segments of the posterior leaflet with a  ruptured chord, eccentric severe MR.  5. CAD: LHC (2/14) showed 95% mRCA stenosis.  This was treated with BMS.   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  Occasional ETOH.  No smoking.   FH: Father with CAD, brother with MI at 38.    Current Outpatient Prescriptions  Medication Sig Dispense Refill  . amLODipine (NORVASC) 5 MG tablet Take 5 mg by mouth every morning.      Marland Kitchen aspirin EC 81 MG tablet Take 81 mg by mouth daily.      Marland Kitchen atorvastatin (LIPITOR) 20 MG tablet Take 1 tablet (20 mg total) by mouth daily.  90 tablet  3  . cetirizine (ZYRTEC) 10 MG tablet Take 10 mg by mouth daily.        . Cholecalciferol (VITAMIN D) 2000 UNITS CAPS Take 1 capsule by mouth daily.      . clopidogrel (PLAVIX) 75 MG tablet Take 1 tablet (75 mg total) by mouth daily.  30 tablet  0  . fluticasone (FLONASE) 50 MCG/ACT nasal spray Place 2 sprays into the nose daily.  48 g  11  . metoprolol tartrate (LOPRESSOR) 25 MG tablet Take 25 mg by mouth 2 (two) times daily.      . nitroGLYCERIN (NITROSTAT) 0.4 MG SL tablet Place 1 tablet (0.4 mg total) under the tongue every 5 (five) minutes as needed for chest pain.  25 tablet  3  .  ramipril (ALTACE) 10 MG capsule Take 10 mg by mouth every morning.       No current facility-administered medications for this visit.    BP 114/64  Pulse 59  Ht 5\' 8"  (1.727 m)  Wt 217 lb (98.431 kg)  BMI 33 kg/m2 General: NAD Neck: No JVD, no thyromegaly or thyroid nodule.  Lungs: Clear to auscultation bilaterally with normal respiratory effort. CV: Nondisplaced PMI.  Heart regular S1/S2, no S3/S4, 3/6 HSM at apex.  No peripheral edema.  No carotid bruit.  Normal pedal pulses.  Abdomen: Soft, nontender, no hepatosplenomegaly, no distention.  Neurologic: Alert and oriented x 3.  Psych: Normal affect. Extremities: No clubbing or cyanosis.   Assessment/Plan:  Mitral valve regurgitation  Severe eccentric MR in the setting of MV prolapse and history of MV endocarditis.   There is a ruptured chord with flail of P2 and P1 segments.  I think that he has progressed to severe MR. RHC/LHC showed normal filling pressures and no pulmonary HTN.  LV-gram showed 3-4+ eccentric MR. - After he has been on Plavix for a month given recent BMS, Plavix can be held and he can undergo minimally-invasive MV repair (Dr. Cornelius Moras).  HYPERTENSION BP appears to be reasonably controlled on ramipril and amlodipine.  CAD Status post PCI to [facility] with BMS.  As above, Plavix can be held for MV repair after a month.  Hyperlipidemia Statin begun with CAD.  Will check lipids/LFTs in 2 months.   [name] 09/04/2012

## 2012-09-08 ENCOUNTER — Other Ambulatory Visit: Payer: Self-pay | Admitting: *Deleted

## 2012-09-08 MED ORDER — METOPROLOL TARTRATE 25 MG PO TABS: 25.0000 mg | ORAL_TABLET | Freq: Two times a day (BID) | ORAL | Status: DC

## 2012-09-16 ENCOUNTER — Other Ambulatory Visit: Payer: Self-pay | Admitting: *Deleted

## 2012-09-16 ENCOUNTER — Ambulatory Visit (INDEPENDENT_AMBULATORY_CARE_PROVIDER_SITE_OTHER): Payer: BC Managed Care – PPO | Admitting: Thoracic Surgery (Cardiothoracic Vascular Surgery)

## 2012-09-16 ENCOUNTER — Encounter: Payer: Self-pay | Admitting: Thoracic Surgery (Cardiothoracic Vascular Surgery)

## 2012-09-16 VITALS — BP 129/73 | HR 62 | Resp 18 | Ht 68.0 in | Wt 217.0 lb

## 2012-09-16 DIAGNOSIS — I341 Nonrheumatic mitral (valve) prolapse: Secondary | ICD-10-CM

## 2012-09-16 DIAGNOSIS — I2510 Atherosclerotic heart disease of native coronary artery without angina pectoris: Secondary | ICD-10-CM

## 2012-09-16 DIAGNOSIS — I340 Nonrheumatic mitral (valve) insufficiency: Secondary | ICD-10-CM

## 2012-09-16 DIAGNOSIS — I059 Rheumatic mitral valve disease, unspecified: Secondary | ICD-10-CM

## 2012-09-16 MED ORDER — AMIODARONE HCL 200 MG PO TABS: 200.0000 mg | ORAL_TABLET | Freq: Two times a day (BID) | ORAL | Status: DC

## 2012-09-16 NOTE — Progress Notes (Signed)
[address]            [name] [address]          [phone]     CARDIOTHORACIC SURGERY OFFICE NOTE  Referring Provider is [name], MD PCP is No primary provider on file.   HPI:  Patient returns for followup of severe symptomatic mitral regurgitation. He was originally seen in consultation on 08/22/2012. Since then he underwent PCI and stenting of the right coronary artery by Dr. [name] on 08/25/2012.  He has done quite well since then and he returns to the office today hoping to proceed with elective mitral valve repair in the near future. He reports stable symptoms of exertional shortness of breath which has not changed. He has never had any chest pain or chest tightness. He states that Plavix therapy has made him feel cold and he is looking forward stopping it later this week when his current prescription runs out. The remainder of his review of systems is unchanged.   Current Outpatient Prescriptions  Medication Sig Dispense Refill  . amLODipine (NORVASC) 5 MG tablet Take 5 mg by mouth every morning.      Marland Kitchen aspirin EC 81 MG tablet Take 81 mg by mouth daily.      Marland Kitchen atorvastatin (LIPITOR) 20 MG tablet Take 1 tablet (20 mg total) by mouth daily.  90 tablet  3  . cetirizine (ZYRTEC) 10 MG tablet Take 10 mg by mouth daily.        . Cholecalciferol (VITAMIN D) 2000 UNITS CAPS Take 1 capsule by mouth daily.      . fluticasone (FLONASE) 50 MCG/ACT nasal spray Place 2 sprays into the nose daily.  48 g  11  . metoprolol tartrate (LOPRESSOR) 25 MG tablet Take 1 tablet (25 mg total) by mouth 2 (two) times daily.  180 tablet  3  . nitroGLYCERIN (NITROSTAT) 0.4 MG SL tablet Place 1 tablet (0.4 mg total) under the tongue every 5 (five) minutes as needed for chest pain.  25 tablet  3  . ramipril (ALTACE) 10 MG capsule Take 10 mg by mouth every morning.      Marland Kitchen amiodarone (PACERONE) 200 MG tablet Take 1 tablet (200 mg total) by mouth 2 (two) times daily.  Begin 7 days prior to surgery.  14 tablet  0  . clopidogrel (PLAVIX) 75 MG tablet Take 1 tablet (75 mg total) by mouth daily.  30 tablet  0   No current facility-administered medications for this visit.      Physical Exam:   BP 129/73  Pulse 62  Resp 18  Ht 5\' 8"  (1.727 m)  Wt 217 lb (98.431 kg)  BMI 33 kg/m2  SpO2 99%  General:  Well-appearing  Chest:   Clear to auscultation  CV:   Regular rate and rhythm with systolic murmur  Incisions:  n/a  Abdomen:  Soft and nontender  Extremities:  Warm and well-perfused  Diagnostic Tests:  CARDIAC CATH NOTE  Name: IZEN PETZ  MRN: [id number]  DOB: [birth year]  Procedure: PTCA and stenting of the mid-RCA  Indication: Severe single vessel CAD in patient with mitral regurgitation and planned minimally-invasive mitral valve repair.  Procedural Details: The right wrist was prepped, draped, and anesthetized with 1% lidocaine. Using the modified Seldinger technique, a 6 Fr sheath was introduced into the radial artery. 3 mg verapamil was administered  through the radial sheath. Weight-based bivalirudin was given for anticoagulation. Once a therapeutic ACT was achieved, a 6 [place] JR4 guide catheter was inserted. RCA angiography confirmed severe eccentic 95% napkin-ring stenosis of the mid/distal RCA junction. A Cougar coronary guidewire was used to cross the lesion. The lesion was predilated with a 3.0x12 mm balloon. The lesion was then stented with a 4.0x15 mm Vision bare-metal stent. The stent was postdilated with a 4.5 x 12 mm noncompliant balloon. Following PCI, there was 0% residual stenosis and TIMI-3 flow. Final angiography confirmed an excellent result. The patient tolerated the procedure well. There were no immediate procedural complications. A TR band was used for radial hemostasis. The patient was transferred to the post catheterization recovery area for further monitoring.  Lesion Data:  Vessel: RCA/mid  Percent stenosis (pre): 95    TIMI-flow (pre): 3  Stent: 4.0x15 mm bare metal  Percent stenosis (post): 0  TIMI-flow (post): 3  Conclusions: Successful PCI of the RCA using a bare metal stent  Recommendations: ASA/plavix 30 days, then stop plavix in anticipation of cardiac surgery. As patient was adequately preloaded with plavix and procedure was uncomplicated, will initiate PCI same-day discharge protocol.  Tonny Bollman  08/25/2012, 8:36 AM       Impression:  The patient has severe symptomatic mitral regurgitation with long-standing mitral valve prolapse, history of Strep viridans bacterial endocarditis in 2009, and recent onset of exertional shortness of breath. Left ventricular function appears fairly well preserved. The patient underwent successful PCI for single-vessel coronary artery disease including high-grade 95% stenosis of the mid right coronary artery using a bare metal stent.  He now returns with hopes to proceed with elective mitral valve repair via mini thoracotomy approach in the near future.    Plan:  I have again reviewed the indications, risks, and potential benefits of mitral valve repair with the patient in the office today. The rationale for elective mitral valve repair surgery has been explained, including a comparison between surgery and continued medical therapy with close follow-up.  The likelihood of successful and durable valve repair has been discussed with particular reference to the findings of their recent echocardiogram.  Based upon these findings and previous experience, I have quoted them a greater than 90 percent likelihood of successful valve repair.  In the unlikely event that their valve cannot be successfully repaired, we discussed the possibility of replacing the mitral valve using a mechanical prosthesis with the attendant need for long-term anticoagulation versus the alternative of replacing it using a bioprosthetic tissue valve with its potential for late structural valve  deterioration and failure, depending upon the patient's longevity.  The patient specifically requests that if the mitral valve must be replaced that it be done using a mechanical valve.  Alternative surgical approaches been discussed including a comparison between conventional sternotomy and minimally invasive techniques.  The patient understands and accepts all potential associated risks of surgery including but not limited to risk of death, stroke, myocardial infarction, congestive heart failure, respiratory failure, renal failure, bleeding requiring blood transfusion and/or reexploration, arrhythmia, heart block or bradycardia requiring permanent pacemaker, pneumonia, pleural effusion, wound infection, pulmonary embolus or other thromboembolic complication, chronic pain or other delayed complications related to valve repair.  All questions answered.  We plan to proceed with surgery on Tuesday, March 25. The patient will complete his 30 day course of Plavix this week. He will begin amiodarone one week prior to surgery to decrease his risk of perioperative atrial arrhythmias.  Salvatore Decent. Cornelius Moras, MD 09/16/2012 12:32 PM

## 2012-09-16 NOTE — Patient Instructions (Signed)
Stop taking Plavix this week when your prescription runs out.  Begin amiodarone 1 week prior to surgery.  Continue all other medications except Plavix through Monday 09/26/2012  On the morning of surgery take only metoprolol (Lopressor) with a sip of water

## 2012-09-19 ENCOUNTER — Ambulatory Visit: Payer: BC Managed Care – PPO | Admitting: Thoracic Surgery (Cardiothoracic Vascular Surgery)

## 2012-09-19 DIAGNOSIS — Z0279 Encounter for issue of other medical certificate: Secondary | ICD-10-CM

## 2012-09-20 ENCOUNTER — Encounter (HOSPITAL_COMMUNITY): Payer: Self-pay | Admitting: Pharmacy Technician

## 2012-09-23 ENCOUNTER — Encounter (HOSPITAL_COMMUNITY)
Admission: RE | Admit: 2012-09-23 | Discharge: 2012-09-23 | Disposition: A | Discharge status: Home / Self Care | Payer: BC Managed Care – PPO | Source: Ambulatory Visit | Attending: Thoracic Surgery (Cardiothoracic Vascular Surgery) | Admitting: Thoracic Surgery (Cardiothoracic Vascular Surgery)

## 2012-09-23 ENCOUNTER — Encounter (HOSPITAL_COMMUNITY): Payer: Self-pay

## 2012-09-23 ENCOUNTER — Ambulatory Visit (HOSPITAL_COMMUNITY)
Admission: RE | Admit: 2012-09-23 | Discharge: 2012-09-23 | Disposition: A | Discharge status: Home / Self Care | Payer: BC Managed Care – PPO | Source: Ambulatory Visit | Attending: Thoracic Surgery (Cardiothoracic Vascular Surgery) | Admitting: Thoracic Surgery (Cardiothoracic Vascular Surgery)

## 2012-09-23 VITALS — BP 139/74 | HR 61 | Temp 98.7°F | Resp 20 | Ht 70.0 in | Wt 221.4 lb

## 2012-09-23 DIAGNOSIS — I059 Rheumatic mitral valve disease, unspecified: Secondary | ICD-10-CM

## 2012-09-23 DIAGNOSIS — Z01818 Encounter for other preprocedural examination: Secondary | ICD-10-CM | POA: Insufficient documentation

## 2012-09-23 DIAGNOSIS — Z01810 Encounter for preprocedural cardiovascular examination: Secondary | ICD-10-CM

## 2012-09-23 DIAGNOSIS — I10 Essential (primary) hypertension: Secondary | ICD-10-CM | POA: Insufficient documentation

## 2012-09-23 DIAGNOSIS — K219 Gastro-esophageal reflux disease without esophagitis: Secondary | ICD-10-CM | POA: Insufficient documentation

## 2012-09-23 DIAGNOSIS — I2510 Atherosclerotic heart disease of native coronary artery without angina pectoris: Secondary | ICD-10-CM | POA: Insufficient documentation

## 2012-09-23 DIAGNOSIS — Z01812 Encounter for preprocedural laboratory examination: Secondary | ICD-10-CM | POA: Insufficient documentation

## 2012-09-23 HISTORY — DX: Gastro-esophageal reflux disease without esophagitis: K21.9

## 2012-09-23 HISTORY — DX: Unspecified osteoarthritis, unspecified site: M19.90

## 2012-09-23 LAB — COMPREHENSIVE METABOLIC PANEL
ALT: 25 U/L (ref 0–53)
AST: 21 U/L (ref 0–37)
Albumin: 3.8 g/dL (ref 3.5–5.2)
Alkaline Phosphatase: 90 U/L (ref 39–117)
BUN: 17 mg/dL (ref 6–23)
CO2: 27 mEq/L (ref 19–32)
Calcium: 9 mg/dL (ref 8.4–10.5)
Chloride: 104 mEq/L (ref 96–112)
Creatinine, Ser: 0.93 mg/dL (ref 0.50–1.35)
GFR calc Af Amer: >90 mL/min (ref >90)
GFR calc non Af Amer: 88 mL/min — ABNORMAL LOW (ref >90)
Glucose, Bld: 117 mg/dL — ABNORMAL HIGH (ref 70–99)
Potassium: 3.5 mEq/L (ref 3.5–5.1)
Sodium: 141 mEq/L (ref 135–145)
Total Bilirubin: 0.6 mg/dL (ref 0.3–1.2)
Total Protein: 6.8 g/dL (ref 6.0–8.3)

## 2012-09-23 LAB — BLOOD GAS, ARTERIAL
Acid-Base Excess: 3.1 mmol/L — ABNORMAL HIGH (ref 0.0–2.0)
Bicarbonate: 26.6 mEq/L — ABNORMAL HIGH (ref 20.0–24.0)
Drawn by: 344381
FIO2: 0.21 %
O2 Saturation: 99.4 %
Patient temperature: 98.6
TCO2: 27.8 mmol/L (ref 0–100)
pCO2 arterial: 37.3 mmHg (ref 35.0–45.0)
pH, Arterial: 7.467 — ABNORMAL HIGH (ref 7.350–7.450)
pO2, Arterial: 104 mmHg — ABNORMAL HIGH (ref 80.0–100.0)

## 2012-09-23 LAB — URINALYSIS, ROUTINE W REFLEX MICROSCOPIC
Bilirubin Urine: NEGATIVE
Glucose, UA: NEGATIVE mg/dL
Hgb urine dipstick: NEGATIVE
Ketones, ur: NEGATIVE mg/dL
Leukocytes, UA: NEGATIVE
Nitrite: NEGATIVE
Protein, ur: NEGATIVE mg/dL
Specific Gravity, Urine: 1.019 (ref 1.005–1.030)
Urobilinogen, UA: 1 mg/dL (ref 0.0–1.0)
pH: 7 (ref 5.0–8.0)

## 2012-09-23 LAB — PROTIME-INR
INR: 0.96 (ref 0.00–1.49)
Prothrombin Time: 12.7 seconds (ref 11.6–15.2)

## 2012-09-23 LAB — ABO/RH: ABO/RH(D): O POS

## 2012-09-23 LAB — CBC
HCT: 41.2 % (ref 39.0–52.0)
Hemoglobin: 14.2 g/dL (ref 13.0–17.0)
MCH: 29.3 pg (ref 26.0–34.0)
MCHC: 34.5 g/dL (ref 30.0–36.0)
MCV: 85.1 fL (ref 78.0–100.0)
Platelets: 142 10*3/uL — ABNORMAL LOW (ref 150–400)
RBC: 4.84 MIL/uL (ref 4.22–5.81)
RDW: 13.1 % (ref 11.5–15.5)
WBC: 5.7 10*3/uL (ref 4.0–10.5)

## 2012-09-23 LAB — APTT: aPTT: 29 seconds (ref 24–37)

## 2012-09-23 LAB — PULMONARY FUNCTION TEST

## 2012-09-23 LAB — HEMOGLOBIN A1C
Hgb A1c MFr Bld: 5.7 % — ABNORMAL HIGH (ref <5.7)
Mean Plasma Glucose: 117 mg/dL — ABNORMAL HIGH (ref <117)

## 2012-09-23 LAB — TYPE AND SCREEN
ABO/RH(D): O POS
Antibody Screen: NEGATIVE

## 2012-09-23 LAB — SURGICAL PCR SCREEN
MRSA, PCR: NEGATIVE
Staphylococcus aureus: NEGATIVE

## 2012-09-23 MED ORDER — ALBUTEROL SULFATE (5 MG/ML) 0.5% IN NEBU
2.5000 mg | INHALATION_SOLUTION | Freq: Once | RESPIRATORY_TRACT | Status: AC
  Administered 2012-09-23: 2.5 mg via RESPIRATORY_TRACT

## 2012-09-23 NOTE — Progress Notes (Signed)
VASCULAR LAB PRELIMINARY  PRELIMINARY  PRELIMINARY  PRELIMINARY  Pre-op Cardiac Surgery  Carotid Findings: Bilateral:  No evidence of hemodynamically significant internal carotid artery stenosis.   Vertebral artery flow is antegrade.     Upper Extremity Right Left  Brachial Pressures 141 Triphasic 141 Triphasic  Radial Waveforms Triphasic Triphasic  Ulnar Waveforms Triphasic Triphasic  Palmar Arch (Allen's Test) Normal Normal   Findings:   Doppler waveforms remained normal bilaterally with both radial and ulnar compressions.                            Richerd Grime, RVS 09/23/2012, 5:04 PM

## 2012-09-23 NOTE — Pre-Procedure Instructions (Addendum)
Christopher [name]  09/23/2012   Your procedure is scheduled on:  09/27/12  Report to [facility] at 530AM.  Call this number if you have problems the morning of surgery: [phone]   Remember:   Do not eat food or drink liquids after midnight.   Take these medicines the morning of surgery with A SIP OF WATER: amlodipine, amiodarone, metoprolol  STOP plavix per dr   Drucilla Schmidt not wear jewelry, make-up or nail polish.  Do not wear lotions, powders, or perfumes. You may wear deodorant.  Do not shave 48 hours prior to surgery. Men may shave face and neck.  Do not bring valuables to the hospital.  Contacts, dentures or bridgework may not be worn into surgery.  Leave suitcase in the car. After surgery it may be brought to your room.  For patients admitted to the hospital, checkout time is 11:00 AM the day of  discharge.   Patients discharged the day of surgery will not be allowed to drive  home.  Name and phone number of your driver:   Special Instructions: Incentive Spirometry - Practice and bring it with you on the day of surgery. Shower using CHG 2 nights before surgery and the night before surgery.  If you shower the day of surgery use CHG.  Use special wash - you have one bottle of CHG for all showers.  You should use approximately 1/3 of the bottle for each shower.   Please read over the following fact sheets that you were given: Pain Booklet, Coughing and Deep Breathing, Blood Transfusion Information, Open Heart Packet, MRSA Information and Surgical Site Infection Prevention

## 2012-09-26 MED ORDER — EPINEPHRINE HCL 1 MG/ML IJ SOLN
0.5000 ug/min | INTRAVENOUS | Status: DC
  Filled 2012-09-26: qty 4

## 2012-09-26 MED ORDER — VANCOMYCIN HCL 1000 MG IV SOLR
INTRAVENOUS | Status: AC
  Administered 2012-09-27: 08:00:00
  Filled 2012-09-26: qty 1000

## 2012-09-26 MED ORDER — VANCOMYCIN HCL 10 G IV SOLR
1500.0000 mg | INTRAVENOUS | Status: AC
  Administered 2012-09-27: 1500 mg via INTRAVENOUS
  Filled 2012-09-26: qty 1500

## 2012-09-26 MED ORDER — NITROGLYCERIN IN D5W 200-5 MCG/ML-% IV SOLN
2.0000 ug/min | INTRAVENOUS | Status: AC
  Administered 2012-09-27: 16.6 ug/min via INTRAVENOUS
  Filled 2012-09-26: qty 250

## 2012-09-26 MED ORDER — DEXMEDETOMIDINE HCL IN NACL 400 MCG/100ML IV SOLN
0.1000 ug/kg/h | INTRAVENOUS | Status: AC
  Administered 2012-09-27: 0.2 ug/kg/h via INTRAVENOUS
  Filled 2012-09-26: qty 100

## 2012-09-26 MED ORDER — SODIUM CHLORIDE 0.9 % IV SOLN
INTRAVENOUS | Status: AC
  Administered 2012-09-27: 1 [IU]/h via INTRAVENOUS
  Filled 2012-09-26: qty 1

## 2012-09-26 MED ORDER — DEXTROSE 5 % IV SOLN
750.0000 mg | INTRAVENOUS | Status: DC
  Filled 2012-09-26: qty 750

## 2012-09-26 MED ORDER — DOPAMINE-DEXTROSE 3.2-5 MG/ML-% IV SOLN
2.0000 ug/kg/min | INTRAVENOUS | Status: DC
  Filled 2012-09-26: qty 250

## 2012-09-26 MED ORDER — SODIUM CHLORIDE 0.9 % IV SOLN
INTRAVENOUS | Status: AC
  Administered 2012-09-27: 70 mL/h via INTRAVENOUS
  Filled 2012-09-26 (×2): qty 40

## 2012-09-26 MED ORDER — PLASMA-LYTE 148 IV SOLN
INTRAVENOUS | Status: DC
  Filled 2012-09-26: qty 2.5

## 2012-09-26 MED ORDER — MAGNESIUM SULFATE 50 % IJ SOLN
40.0000 meq | INTRAMUSCULAR | Status: DC
  Filled 2012-09-26: qty 10

## 2012-09-26 MED ORDER — POTASSIUM CHLORIDE 2 MEQ/ML IV SOLN
80.0000 meq | INTRAVENOUS | Status: DC
  Filled 2012-09-26: qty 40

## 2012-09-26 MED ORDER — PHENYLEPHRINE HCL 10 MG/ML IJ SOLN
30.0000 ug/min | INTRAVENOUS | Status: DC
  Filled 2012-09-26: qty 2

## 2012-09-26 MED ORDER — METOPROLOL TARTRATE 12.5 MG HALF TABLET: 12.5000 mg | ORAL_TABLET | Freq: Once | ORAL | Status: DC

## 2012-09-26 MED ORDER — DEXTROSE 5 % IV SOLN
1.5000 g | INTRAVENOUS | Status: AC
  Administered 2012-09-27: 1.5 g via INTRAVENOUS
  Administered 2012-09-27: .75 g via INTRAVENOUS
  Filled 2012-09-26 (×2): qty 1.5

## 2012-09-26 NOTE — Progress Notes (Signed)
Anesthesia Chart Review:  Patient is a 62 year old male scheduled for minimally invasive MV repair by Dr. Cornelius Moras on 09/27/12. 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 Cardiologist is Dr. [name].  Cardiac cath on 08/12/12 showed LM without significant stenosis, LAD with luminal irregularities, large ramus with 40% stenosis just after a branch point in the proximal vessel, LCx with mild luminal irregularities, large RCA with 95% mid stenosis (s/p PTCA/BMS on 08/25/12), LVEF 55-60%, basal to mid inferior hypokinesis, 3-4+ MR.  TEE on 08/12/12 showed: - Left ventricle: The cavity size was normal. Wall thickness was normal. Systolic function was normal. The estimated ejection fraction was in the range of 55% to 60%. Wall motion was normal; there were no regional wall motion abnormalities. - Aortic valve: There was no stenosis. Trivial regurgitation. - Aorta: Normal caliber aorta with minimal plaque. - Mitral valve: The mitral valve has flail of the P2 and probably P1 segments. 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 The tips of the mitral valve are moderately thickened. MR appears to be eccentric and severe. It is severe by PISA measurement though I did not find flow reversal in the pulmonary veins (perhaps because the MR is very eccentric). Effective regurgitant orifice: 0.53cm^2 (PISA). Regurgitant volume: (PISA). - Left atrium: The atrium was mildly to moderately dilated. No evidence of thrombus in the atrial cavity or appendage. - Right ventricle: The cavity size was normal. Systolic function was normal. - Right atrium: No evidence of thrombus in the atrial cavity or appendage. - Pulmonic valve: No evidence of vegetation.  EKG on 09/02/12 showed SB with PACs, right BBB, LAFB, bifascicular block, cannot rule  out inferior infarct (age undetermined).  Preliminary carotid dopplers on 09/23/12 showed no evidence of significant ICA stenosis, vertebral artery flow is antegrade.  Pulmonary function testing on 09/23/12 showed FEV1 2.69 (78%).  Preoperative labs and CXR noted.    Anticipate he can proceed as planned.  Velna Ochs [facility]/Anesthesiology Phone [phone] 09/26/2012 9:31 AM

## 2012-09-26 NOTE — H&P (Signed)
CARDIOTHORACIC SURGERY HISTORY AND PHYSICAL EXAM  Referring Provider is Christopher Morale, MD PCP is No primary provider on file.    Chief Complaint   Patient presents with   .  Mitral Regurgitation       Referral from Dr Christopher [name] for severe Mitral regurgitation, Cardiac Cath  08/12/12, Echo 08/12/12     HPI:  Patient is a 62 year old male with history of mitral valve prolapse and mitral regurgitation dating back many years. The patient states that he was first told he had a heart murmur at least 15 years ago. 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 Blood cultures grew strep viridans and transesophageal echocardiogram at the time confirmed what appeared to be vegetation on the mitral valve with moderate to severe mitral regurgitation. He has been followed carefully ever since in the [name] office and recently return to see Dr. Shirlee [name] complaining of recent onset exertional shortness of breath.  He underwent transesophageal echocardiogram and left and right heart catheterization by Dr. Shirlee [name] last week. This confirmed the presence of mitral valve prolapse with flail segment of the posterior leaflet and severe mitral regurgitation. Left ventricular function appears reasonably well preserved with ejection fraction estimated 55-60%.  Cardiac catheterization is notable for single-vessel coronary artery disease with high-grade 95% stenosis of the mid right coronary artery.  He was originally seen in consultation on 08/22/2012. Since then he underwent PCI and stenting of the right coronary artery by Dr. Excell [name] on 08/25/2012.  He has done quite well since then and he returns to the office today hoping to proceed with elective mitral valve repair in the near future.   The patient describes gradual onset of exertional shortness of breath and occasional palpitations. He denies resting shortness of breath, PND, orthopnea, or lower extremity edema. She's not had any  significant chest pain or chest tightness either with activity or at rest. He gets short of breath only with moderate physical activity, such as walking up a flight of stairs, probably functional class II.  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  His symptoms of exertional shortness of breath have not changed since he underwent PCI and stenting. He has never had any chest pain or chest tightness. He states that Plavix therapy has made him feel cold and he is looking forward stopping it later this week when his current prescription runs out. The remainder of his review of systems is unchanged.    Past Medical History  Diagnosis Date  . Hypertension   . Endocarditis 2009    Strep viridans  . Rhinitis, allergic   . Severe mitral regurgitation     a. Mitral valve prolapse with flail segment of posterior leaflet and severe MR by TEE, remote h/o bacterial endocarditis   . Coronary artery disease     a. 2/7 Cath: LM nl, LAD min irregs, LCX min irregs, RI 40, RCA 52m, EF 55-60% basal to mid inf HK, 3-4+ MR;  b. 08/25/2012 PCI of RCA with 4.0x15 Vision BMS  . 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 Sleep apnea     NPSG 01/21/06- AHI 40.7/hr cpap  . Heart murmur   . GERD (gastroesophageal reflux disease)     hx  . Arthritis     Past Surgical History  Procedure Laterality Date  .  Septoplasty      Dr. Haroldine [name]  . Uvulopalatopharyngoplasty    . Tonsillectomy    . Foot surgery      BILATERAL TOES  . Amputation  07/28/2012    Procedure: AMPUTATION DIGIT;  Surgeon: Christopher Rad, MD;  Location: [facility];  Service: Orthopedics;  Laterality: Right;  2nd toe  . Tee without cardioversion N/A 08/12/2012    Procedure: TRANSESOPHAGEAL ECHOCARDIOGRAM (TEE);  Surgeon: Christopher Morale, MD;  Location: [facility] ENDOSCOPY;  Service: Cardiovascular;  Laterality: N/A;  .  Cardiac catheterization      Family History  Problem Relation Age of Onset  . Heart disease Father   . Diabetes Mother   . Stroke Mother   . Cancer Father   . Diabetes Father     Social History History  Substance Use Topics  . Smoking status: Former Smoker -- 2.50 packs/day for 5 years    Types: Cigarettes    Quit date: 07/06/1978  . Smokeless tobacco: Not on file     Comment: social drinker  . Alcohol Use: Yes     Comment: OCC.    Prior to Admission medications   Medication Sig Start Date End Date Taking? Authorizing Provider  amiodarone (PACERONE) 200 MG tablet Take 1 tablet (200 mg total) by mouth 2 (two) times daily. Begin 7 days prior to surgery. 09/16/12  Yes Christopher Nails, MD  amLODipine (NORVASC) 5 MG tablet Take 5 mg by mouth every morning.   Yes Historical Provider, MD  aspirin EC 81 MG tablet Take 81 mg by mouth daily.   Yes Historical Provider, MD  atorvastatin (LIPITOR) 20 MG tablet Take 1 tablet (20 mg total) by mouth daily. 09/02/12  Yes Christopher Morale, MD  cetirizine (ZYRTEC) 10 MG tablet Take 10 mg by mouth daily.     Yes Historical Provider, MD  Cholecalciferol (VITAMIN D) 2000 UNITS CAPS Take 1 capsule by mouth daily.   Yes Historical Provider, MD  fluticasone (FLONASE) 50 MCG/ACT nasal spray Place 2 sprays into the nose daily. 07/06/12  Yes Christopher M Marte, PA-C  metoprolol tartrate (LOPRESSOR) 25 MG tablet Take 1 tablet (25 mg total) by mouth 2 (two) times daily. 09/08/12  Yes Christopher Morale, MD  nitroGLYCERIN (NITROSTAT) 0.4 MG SL tablet Place 1 tablet (0.4 mg total) under the tongue every 5 (five) minutes as needed for chest pain. 08/25/12  Yes Christopher Anis, NP  ramipril (ALTACE) 10 MG capsule Take 10 mg by mouth every morning.   Yes Historical Provider, MD  clopidogrel (PLAVIX) 75 MG tablet Take 1 tablet (75 mg total) by mouth daily. 08/25/12   Christopher Anis, NP    No Known Allergies    Review of Systems:              General:                       normal appetite, normal energy, + 15 lb weight gain, no weight loss, no fever             Cardiac:                      no chest pain with exertion, no chest pain at rest, + SOB with moderate exertion, no resting SOB, no PND, no orthopnea, + palpitations, no arrhythmia, no atrial fibrillation, no LE edema, no dizzy spells, no syncope  Respiratory:                + exertional shortness of breath, no home oxygen, no productive cough, no dry cough, no bronchitis, no wheezing, no hemoptysis, no asthma, no pain with inspiration or cough, + sleep apnea, + CPAP at night             GI:                                no difficulty swallowing, no reflux, no frequent heartburn, no hiatal hernia, no abdominal pain, no constipation, no diarrhea, no hematochezia, no hematemesis, no melena             GU:                              no dysuria,  no frequency, no urinary tract infection, no hematuria, no enlarged prostate, + kidney stones, no kidney disease             Vascular:                     no pain suggestive of claudication, no pain in feet, no leg cramps, no varicose veins, no DVT, no non-healing foot ulcer             Neuro:                         no stroke, no TIA's, no seizures, no headaches, no temporary blindness one eye,  no slurred speech, no peripheral neuropathy, no chronic pain, no instability of gait, no memory/cognitive dysfunction             Musculoskeletal:         no arthritis, no joint swelling, no myalgias, mild difficulty walking due to recent surgery, good mobility               Skin:                            no rash, no itching, no skin infections, no pressure sores or ulcerations             Psych:                         + anxiety, no depression, + nervousness, no unusual recent stress             Eyes:                           no blurry vision, no floaters, no recent vision changes, + wears glasses or contacts             ENT:                            no  hearing loss, no loose or painful teeth, no dentures, last saw dentist Sept 2013 - goes every 6 months             Hematologic:               no easy bruising, no abnormal bleeding, no clotting disorder, no frequent epistaxis  Endocrine:                   no diabetes, does not check CBG's at home                           Physical Exam:              BP 141/78  Pulse 68  Resp 20  Ht 5\' 10"  (1.778 m)  Wt 220 lb (99.791 kg)  BMI 31.57 kg/m2  SpO2 98%             General:                        well-appearing             HEENT:                       Unremarkable               Neck:                           no JVD, no bruits, no adenopathy               Chest:                         clear to auscultation, symmetrical breath sounds, no wheezes, no rhonchi               CV:                              RRR, grade II-III/VI systolic murmur best at RLSB             Abdomen:                    soft, non-tender, no masses               Extremities:                 warm, well-perfused, pulses palpable, no LE edema             Rectal/GU                   Deferred             Neuro:                         Grossly non-focal and symmetrical throughout             Skin:                            Clean and dry, no rashes, no breakdown   Diagnostic Tests:  Transesophageal Echocardiography  Patient: Christopher [name], [name] MR #: [id number] Study Date: 08/12/2012 Gender: M Age: 61 Height: 177.8cm Weight: 97.3kg BSA: 2.58m^2 Pt. Status: Room: [name]  ADMITTING [name] ATTENDING [name], Dalton Anson Fret, Dalton PERFORMING [name], Dalton SONOGRAPHER Dewitt Hoes, RDCS cc:  ------------------------------------------------------------ LV EF: 55% - 60%  ------------------------------------------------------------ Indications: Mitral regurgitation 424.0.  ------------------------------------------------------------ Study Conclusions  - Left ventricle: The cavity size was  normal. Wall thickness was normal. Systolic function was normal. The estimated ejection fraction  was in the range of 55% to 60%. Wall motion was normal; there were no regional wall motion abnormalities. - Aortic valve: There was no stenosis. Trivial regurgitation. - Aorta: Normal caliber aorta with minimal plaque. - Mitral valve: The mitral valve has flail of the P2 and probably P1 segments. 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 The tips of the mitral valve are moderately thickened. MR appears to be eccentric and severe. It is severe by PISA measurement though I did not find flow reversal in the pulmonary veins (perhaps because the MR is very eccentric). Effective regurgitant orifice: 0.53cm^2 (PISA). Regurgitant volume: (PISA). - Left atrium: The atrium was mildly to moderately dilated. No evidence of thrombus in the atrial cavity or appendage. - Right ventricle: The cavity size was normal. Systolic function was normal. - Right atrium: No evidence of thrombus in the atrial cavity or appendage. - Pulmonic valve: No evidence of vegetation. Transesophageal echocardiography. 2D and color Doppler. Height: Height: 177.8cm. Height: 70in. Weight: Weight: 97.3kg. Weight: 214lb. Body mass index: BMI: 30.8kg/m^2. Body surface area: BSA: 2.53m^2. Blood pressure: 145/57. Patient status: Inpatient. Location: Endoscopy.  ------------------------------------------------------------  ------------------------------------------------------------ Left ventricle: The cavity size was normal. Wall thickness was normal. Systolic function was normal. The estimated ejection fraction was in the range of 55% to 60%. Wall motion was normal; there were no regional wall motion abnormalities.  ------------------------------------------------------------ Aortic valve: Trileaflet. Doppler: There was no stenosis. Trivial  regurgitation.  ------------------------------------------------------------ Aorta: Normal caliber aorta with minimal plaque.  ------------------------------------------------------------ Mitral valve: The mitral valve has flail of the P2 and probably P1 segments. 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 The tips of the mitral valve are moderately thickened. MR appears to be eccentric and severe. It is severe by PISA measurement though I did not find flow reversal in the pulmonary veins (perhaps because the MR is very eccentric). Doppler: There was no evidence for stenosis.  ------------------------------------------------------------ Left atrium: The atrium was mildly to moderately dilated. No evidence of thrombus in the atrial cavity or appendage.  ------------------------------------------------------------ Right ventricle: The cavity size was normal. Systolic function was normal.  ------------------------------------------------------------ Pulmonic valve: Structurally normal valve. Cusp separation was normal. No evidence of vegetation. Doppler: Trivial regurgitation.  ------------------------------------------------------------ Tricuspid valve: Doppler: Trivial regurgitation.  ------------------------------------------------------------ Right atrium: The atrium was normal in size. No evidence of thrombus in the atrial cavity or appendage.  ------------------------------------------------------------ Pericardium: There was no pericardial effusion.  ------------------------------------------------------------ Post procedure conclusions Ascending Aorta:  - Normal caliber aorta with minimal plaque.  ------------------------------------------------------------  Doppler measurements Normal Mitral valve Regurg 38.5 cm/s ------ alias vel, PISA Max regurg 658 cm/s ------ vel Regurg VTI 226 cm ------ ERO, PISA 0.53 cm^2 ------ Regurg 120 ml  ------ vol, PISA  ------------------------------------------------------------ Prepared and Electronically Authenticated by  [name] [id number]:[id number]      Cardiac Catheterization Procedure Note  Name: JOVONI BORKENHAGEN   MRN: [id number]   DOB: 1950/09/06   Procedure: Right Heart Cath, Left Heart Cath, Selective Coronary Angiography, LV angiography, Descending aortogram with iliac/femoral runoff.   Indication:   Procedural Details: The left groin was prepped, draped, and anesthetized with 1% lidocaine. Using the modified Seldinger technique a 5 French sheath was placed in the left femoral artery and a 7 French sheath was placed in the left femoral vein. A Swan-Ganz catheter was used for the right heart catheterization. Standard protocol was followed for recording of right heart pressures and sampling of oxygen saturations.  Fick cardiac output was calculated. Standard Judkins catheters were used for selective coronary angiography and left ventriculography. There were no immediate procedural complications. The patient was transferred to the post catheterization recovery area for further monitoring.   Procedural Findings:   Hemodynamics (mmHg)   RA mean 7   RV 27/5   PA 27/12   PCWP mean 12 (V-waves not particularly prominent but difficult to assess with frequent PVCs)   LV 130/18   AO 128/69   Oxygen saturations:   PA 70%   AO 90%   Cardiac Output (Fick) 6.87 L/min   Cardiac Index (Fick) 3.2   Coronary angiography:   Coronary dominance: right   Left mainstem: Long left main without significant.   Left anterior descending (LAD): Luminal irregularities.   Left circumflex (LCx): There was a large ramus with 40% stenosis just after a branch point in the proximal vessel. The LCx had mild luminal irregularities.   Right coronary artery (RCA): There is a 95% mid RCA stenosis. The RCA is a large vessel.   Left ventriculography: Left ventricular systolic function is normal, LVEF is  estimated at 55-60%. There is basal to mid inferior hypokinesis. 3-4+ mitral regurgitation noted.   Descending Aortography with Runoff: The descending aorta was normal in caliber with no significant obstructive disease. The iliac system and common femoral artery on the right did not have obstructive disease.   Final Conclusions: Severe mitral regurgitation with severe 95% stenosis in the mid RCA. Patient needs MV repair and grafting of the RCA. He is clinically stable with exertional dyspnea only. No symptoms at rest and no chest pain. I am referring him to [facility] today, will arrange appointment next week. Given the CAD, I am going to have him start metoprolol 25 mg bid and atorvastatin 20 mg daily.   [name]   08/12/2012, 4:17 PM           CARDIAC CATH NOTE   Name: GERSHON SHORTEN   MRN: [id number]   DOB: 1950-08-24   Procedure: PTCA and stenting of the mid-RCA   Indication: Severe single vessel CAD in patient with mitral regurgitation and planned minimally-invasive mitral valve repair.   Procedural Details: The right wrist was prepped, draped, and anesthetized with 1% lidocaine. Using the modified Seldinger technique, a 6 Fr sheath was introduced into the radial artery. 3 mg verapamil was administered through the radial sheath. Weight-based bivalirudin was given for anticoagulation. Once a therapeutic ACT was achieved, a 6 [place] JR4 guide catheter was inserted. RCA angiography confirmed severe eccentic 95% napkin-ring stenosis of the mid/distal RCA junction. A Cougar coronary guidewire was used to cross the lesion. The lesion was predilated with a 3.0x12 mm balloon. The lesion was then stented with a 4.0x15 mm Vision bare-metal stent. The stent was postdilated with a 4.5 x 12 mm noncompliant balloon. Following PCI, there was 0% residual stenosis and TIMI-3 flow. Final angiography confirmed an excellent result. The patient tolerated the procedure well. There were no immediate procedural complications.  A TR band was used for radial hemostasis. The patient was transferred to the post catheterization recovery area for further monitoring.   Lesion Data:   Vessel: RCA/mid   Percent stenosis (pre): 95   TIMI-flow (pre): 3   Stent: 4.0x15 mm bare metal   Percent stenosis (post): 0   TIMI-flow (post): 3   Conclusions: Successful PCI of the RCA using a bare metal stent   Recommendations: ASA/plavix 30 days, then stop plavix in anticipation of  cardiac surgery. As patient was adequately preloaded with plavix and procedure was uncomplicated, will initiate PCI same-day discharge protocol.   Tonny Bollman   08/25/2012, 8:36 AM      Impression:  The patient has severe symptomatic mitral regurgitation with long-standing mitral valve prolapse, history of Strep viridans bacterial endocarditis in 2009, and recent onset of exertional shortness of breath. Left ventricular function appears fairly well preserved. The patient underwent successful PCI for single-vessel coronary artery disease including high-grade 95% stenosis of the mid right coronary artery using a bare metal stent.  He now returns with hopes to proceed with elective mitral valve repair via mini thoracotomy approach in the near future.    Plan:  I have again reviewed the indications, risks, and potential benefits of mitral valve repair with the patient in the office today. The rationale for elective mitral valve repair surgery has been explained, including a comparison between surgery and continued medical therapy with close follow-up.  The likelihood of successful and durable valve repair has been discussed with particular reference to the findings of their recent echocardiogram.  Based upon these findings and previous experience, I have quoted them a greater than 90 percent likelihood of successful valve repair.  In the unlikely event that their valve cannot be successfully repaired, we discussed the possibility of replacing the mitral valve  using a mechanical prosthesis with the attendant need for long-term anticoagulation versus the alternative of replacing it using a bioprosthetic tissue valve with its potential for late structural valve deterioration and failure, depending upon the patient's longevity.  The patient specifically requests that if the mitral valve must be replaced that it be done using a mechanical valve.  Alternative surgical approaches been discussed including a comparison between conventional sternotomy and minimally invasive techniques.  The patient understands and accepts all potential associated risks of surgery including but not limited to risk of death, stroke, myocardial infarction, congestive heart failure, respiratory failure, renal failure, bleeding requiring blood transfusion and/or reexploration, arrhythmia, heart block or bradycardia requiring permanent pacemaker, pneumonia, pleural effusion, wound infection, pulmonary embolus or other thromboembolic complication, chronic pain or other delayed complications related to valve repair.  All questions answered.  We plan to proceed with surgery on Tuesday, March 25. The patient will complete his 30 day course of Plavix this week. He will begin amiodarone one week prior to surgery to decrease his risk of perioperative atrial arrhythmias.     Salvatore Decent. Cornelius Moras, MD

## 2012-09-27 ENCOUNTER — Encounter (HOSPITAL_COMMUNITY): Payer: Self-pay | Admitting: Surgery

## 2012-09-27 ENCOUNTER — Inpatient Hospital Stay (HOSPITAL_COMMUNITY): Payer: BC Managed Care – PPO

## 2012-09-27 ENCOUNTER — Inpatient Hospital Stay (HOSPITAL_COMMUNITY): Payer: BC Managed Care – PPO | Admitting: Anesthesiology

## 2012-09-27 ENCOUNTER — Encounter (HOSPITAL_COMMUNITY): Payer: Self-pay | Admitting: Vascular Surgery

## 2012-09-27 ENCOUNTER — Inpatient Hospital Stay (HOSPITAL_COMMUNITY)
Admission: RE | Admit: 2012-09-27 | Discharge: 2012-10-02 | DRG: 105 | Disposition: A | Discharge status: Home / Self Care | Payer: BC Managed Care – PPO | Source: Ambulatory Visit | Attending: Thoracic Surgery (Cardiothoracic Vascular Surgery) | Admitting: Thoracic Surgery (Cardiothoracic Vascular Surgery)

## 2012-09-27 ENCOUNTER — Encounter (HOSPITAL_COMMUNITY)
Admission: RE | Disposition: A | Discharge status: Home / Self Care | Payer: Self-pay | Source: Ambulatory Visit | Attending: Thoracic Surgery (Cardiothoracic Vascular Surgery)

## 2012-09-27 DIAGNOSIS — Z9889 Other specified postprocedural states: Secondary | ICD-10-CM

## 2012-09-27 DIAGNOSIS — G4730 Sleep apnea, unspecified: Secondary | ICD-10-CM | POA: Diagnosis present

## 2012-09-27 DIAGNOSIS — I2510 Atherosclerotic heart disease of native coronary artery without angina pectoris: Secondary | ICD-10-CM | POA: Diagnosis present

## 2012-09-27 DIAGNOSIS — M129 Arthropathy, unspecified: Secondary | ICD-10-CM | POA: Diagnosis present

## 2012-09-27 DIAGNOSIS — R110 Nausea: Secondary | ICD-10-CM | POA: Diagnosis not present

## 2012-09-27 DIAGNOSIS — E876 Hypokalemia: Secondary | ICD-10-CM | POA: Diagnosis not present

## 2012-09-27 DIAGNOSIS — I059 Rheumatic mitral valve disease, unspecified: Secondary | ICD-10-CM

## 2012-09-27 DIAGNOSIS — D696 Thrombocytopenia, unspecified: Secondary | ICD-10-CM | POA: Diagnosis not present

## 2012-09-27 DIAGNOSIS — K219 Gastro-esophageal reflux disease without esophagitis: Secondary | ICD-10-CM | POA: Diagnosis present

## 2012-09-27 DIAGNOSIS — I959 Hypotension, unspecified: Secondary | ICD-10-CM | POA: Diagnosis not present

## 2012-09-27 DIAGNOSIS — D62 Acute posthemorrhagic anemia: Secondary | ICD-10-CM | POA: Diagnosis not present

## 2012-09-27 DIAGNOSIS — Z9861 Coronary angioplasty status: Secondary | ICD-10-CM

## 2012-09-27 DIAGNOSIS — I10 Essential (primary) hypertension: Secondary | ICD-10-CM | POA: Diagnosis present

## 2012-09-27 HISTORY — PX: INTRAOPERATIVE TRANSESOPHAGEAL ECHOCARDIOGRAM: SHX5062

## 2012-09-27 HISTORY — DX: Other specified postprocedural states: Z98.890

## 2012-09-27 HISTORY — PX: MITRAL VALVE REPAIR: SHX2039

## 2012-09-27 LAB — POCT I-STAT 4, (NA,K, GLUC, HGB,HCT)
Glucose, Bld: 116 mg/dL — ABNORMAL HIGH (ref 70–99)
Glucose, Bld: 116 mg/dL — ABNORMAL HIGH (ref 70–99)
Glucose, Bld: 121 mg/dL — ABNORMAL HIGH (ref 70–99)
Glucose, Bld: 95 mg/dL (ref 70–99)
HCT: 33 % — ABNORMAL LOW (ref 39.0–52.0)
HCT: 29 % — ABNORMAL LOW (ref 39.0–52.0)
HCT: 27 % — ABNORMAL LOW (ref 39.0–52.0)
HCT: 35 % — ABNORMAL LOW (ref 39.0–52.0)
Hemoglobin: 11.2 g/dL — ABNORMAL LOW (ref 13.0–17.0)
Hemoglobin: 9.9 g/dL — ABNORMAL LOW (ref 13.0–17.0)
Hemoglobin: 9.2 g/dL — ABNORMAL LOW (ref 13.0–17.0)
Hemoglobin: 11.9 g/dL — ABNORMAL LOW (ref 13.0–17.0)
Potassium: 4.3 mEq/L (ref 3.5–5.1)
Potassium: 5.3 mEq/L — ABNORMAL HIGH (ref 3.5–5.1)
Potassium: 4.4 mEq/L (ref 3.5–5.1)
Potassium: 3.7 mEq/L (ref 3.5–5.1)
Sodium: 142 mEq/L (ref 135–145)
Sodium: 137 mEq/L (ref 135–145)
Sodium: 136 mEq/L (ref 135–145)
Sodium: 142 mEq/L (ref 135–145)

## 2012-09-27 LAB — POCT I-STAT 3, ART BLOOD GAS (G3+)
Acid-base deficit: 2 mmol/L (ref 0.0–2.0)
Acid-base deficit: 1 mmol/L (ref 0.0–2.0)
Acid-base deficit: 3 mmol/L — ABNORMAL HIGH (ref 0.0–2.0)
Acid-base deficit: 4 mmol/L — ABNORMAL HIGH (ref 0.0–2.0)
Bicarbonate: 23.2 mEq/L (ref 20.0–24.0)
Bicarbonate: 24.6 mEq/L — ABNORMAL HIGH (ref 20.0–24.0)
Bicarbonate: 22.7 mEq/L (ref 20.0–24.0)
Bicarbonate: 22.1 mEq/L (ref 20.0–24.0)
O2 Saturation: 99 %
O2 Saturation: 96 %
O2 Saturation: 95 %
O2 Saturation: 95 %
Patient temperature: 35.5
Patient temperature: 37.9
Patient temperature: 38.8
TCO2: 24 mmol/L (ref 0–100)
TCO2: 26 mmol/L (ref 0–100)
TCO2: 24 mmol/L (ref 0–100)
TCO2: 23 mmol/L (ref 0–100)
pCO2 arterial: 42.2 mmHg (ref 35.0–45.0)
pCO2 arterial: 42.2 mmHg (ref 35.0–45.0)
pCO2 arterial: 44.9 mmHg (ref 35.0–45.0)
pCO2 arterial: 45.7 mmHg — ABNORMAL HIGH (ref 35.0–45.0)
pH, Arterial: 7.349 — ABNORMAL LOW (ref 7.350–7.450)
pH, Arterial: 7.366 (ref 7.350–7.450)
pH, Arterial: 7.316 — ABNORMAL LOW (ref 7.350–7.450)
pH, Arterial: 7.3 — ABNORMAL LOW (ref 7.350–7.450)
pO2, Arterial: 130 mmHg — ABNORMAL HIGH (ref 80.0–100.0)
pO2, Arterial: 81 mmHg (ref 80.0–100.0)
pO2, Arterial: 85 mmHg (ref 80.0–100.0)
pO2, Arterial: 91 mmHg (ref 80.0–100.0)

## 2012-09-27 LAB — POCT I-STAT, CHEM 8
BUN: 12 mg/dL (ref 6–23)
Calcium, Ion: 1.11 mmol/L — ABNORMAL LOW (ref 1.13–1.30)
Chloride: 111 mEq/L (ref 96–112)
Creatinine, Ser: 0.9 mg/dL (ref 0.50–1.35)
Glucose, Bld: 139 mg/dL — ABNORMAL HIGH (ref 70–99)
HCT: 36 % — ABNORMAL LOW (ref 39.0–52.0)
Hemoglobin: 12.2 g/dL — ABNORMAL LOW (ref 13.0–17.0)
Potassium: 4.4 mEq/L (ref 3.5–5.1)
Sodium: 140 mEq/L (ref 135–145)
TCO2: 24 mmol/L (ref 0–100)

## 2012-09-27 LAB — CBC
HCT: 36.5 % — ABNORMAL LOW (ref 39.0–52.0)
HCT: 37.6 % — ABNORMAL LOW (ref 39.0–52.0)
Hemoglobin: 12.6 g/dL — ABNORMAL LOW (ref 13.0–17.0)
Hemoglobin: 13 g/dL (ref 13.0–17.0)
MCH: 29.4 pg (ref 26.0–34.0)
MCH: 29.6 pg (ref 26.0–34.0)
MCHC: 34.5 g/dL (ref 30.0–36.0)
MCHC: 34.6 g/dL (ref 30.0–36.0)
MCV: 85.3 fL (ref 78.0–100.0)
MCV: 85.6 fL (ref 78.0–100.0)
Platelets: 91 10*3/uL — ABNORMAL LOW (ref 150–400)
Platelets: 102 10*3/uL — ABNORMAL LOW (ref 150–400)
RBC: 4.28 MIL/uL (ref 4.22–5.81)
RBC: 4.39 MIL/uL (ref 4.22–5.81)
RDW: 13.4 % (ref 11.5–15.5)
RDW: 13.3 % (ref 11.5–15.5)
WBC: 7.7 10*3/uL (ref 4.0–10.5)
WBC: 10.3 10*3/uL (ref 4.0–10.5)

## 2012-09-27 LAB — HEMOGLOBIN AND HEMATOCRIT, BLOOD
HCT: 30.1 % — ABNORMAL LOW (ref 39.0–52.0)
Hemoglobin: 10.7 g/dL — ABNORMAL LOW (ref 13.0–17.0)

## 2012-09-27 LAB — APTT: aPTT: 33 seconds (ref 24–37)

## 2012-09-27 LAB — CREATININE, SERUM
Creatinine, Ser: 0.96 mg/dL (ref 0.50–1.35)
GFR calc Af Amer: >90 mL/min (ref >90)
GFR calc non Af Amer: 86 mL/min — ABNORMAL LOW (ref >90)

## 2012-09-27 LAB — GLUCOSE, CAPILLARY: Glucose-Capillary: 89 mg/dL (ref 70–99)

## 2012-09-27 LAB — PROTIME-INR
INR: 1.27 (ref 0.00–1.49)
Prothrombin Time: 15.6 seconds — ABNORMAL HIGH (ref 11.6–15.2)

## 2012-09-27 LAB — MAGNESIUM: Magnesium: 3.2 mg/dL — ABNORMAL HIGH (ref 1.5–2.5)

## 2012-09-27 LAB — PLATELET COUNT: Platelets: 91 10*3/uL — ABNORMAL LOW (ref 150–400)

## 2012-09-27 SURGERY — REPAIR, MITRAL VALVE, MINIMALLY INVASIVE
Anesthesia: General | Site: Chest | Laterality: Right | Wound class: Clean

## 2012-09-27 MED ORDER — FENTANYL CITRATE 0.05 MG/ML IJ SOLN
INTRAMUSCULAR | Status: AC
  Filled 2012-09-27: qty 2

## 2012-09-27 MED ORDER — MIDAZOLAM HCL 5 MG/5ML IJ SOLN
INTRAMUSCULAR | Status: DC | PRN
  Administered 2012-09-27: 2 mg via INTRAVENOUS
  Administered 2012-09-27: 4 mg via INTRAVENOUS
  Administered 2012-09-27: 2 mg via INTRAVENOUS

## 2012-09-27 MED ORDER — HEPARIN SODIUM (PORCINE) 1000 UNIT/ML IJ SOLN
INTRAMUSCULAR | Status: DC | PRN
  Administered 2012-09-27: 27000 [IU] via INTRAVENOUS

## 2012-09-27 MED ORDER — SODIUM CHLORIDE 0.9 % IJ SOLN
3.0000 mL | Freq: Two times a day (BID) | INTRAMUSCULAR | Status: DC
  Administered 2012-09-28 (×2): 3 mL via INTRAVENOUS

## 2012-09-27 MED ORDER — PROPOFOL 10 MG/ML IV BOLUS
INTRAVENOUS | Status: DC | PRN
  Administered 2012-09-27 (×2): 50 mg via INTRAVENOUS

## 2012-09-27 MED ORDER — ROCURONIUM BROMIDE 100 MG/10ML IV SOLN
INTRAVENOUS | Status: DC | PRN
  Administered 2012-09-27: 30 mg via INTRAVENOUS
  Administered 2012-09-27 (×2): 50 mg via INTRAVENOUS

## 2012-09-27 MED ORDER — SODIUM CHLORIDE 0.9 % IV SOLN: 250.0000 mL | INTRAVENOUS | Status: DC

## 2012-09-27 MED ORDER — BISACODYL 5 MG PO TBEC
10.0000 mg | DELAYED_RELEASE_TABLET | Freq: Every day | ORAL | Status: DC
  Administered 2012-09-29 – 2012-09-30 (×2): 10 mg via ORAL
  Filled 2012-09-27 (×2): qty 2

## 2012-09-27 MED ORDER — NITROGLYCERIN IN D5W 200-5 MCG/ML-% IV SOLN: 0.0000 ug/min | INTRAVENOUS | Status: DC

## 2012-09-27 MED ORDER — DEXMEDETOMIDINE HCL IN NACL 200 MCG/50ML IV SOLN
0.1000 ug/kg/h | INTRAVENOUS | Status: DC
  Administered 2012-09-27: 0.2 ug/kg/h via INTRAVENOUS
  Filled 2012-09-27: qty 50

## 2012-09-27 MED ORDER — POTASSIUM CHLORIDE 10 MEQ/50ML IV SOLN
10.0000 meq | INTRAVENOUS | Status: AC
  Administered 2012-09-27 (×3): 10 meq via INTRAVENOUS

## 2012-09-27 MED ORDER — FAMOTIDINE IN NACL 20-0.9 MG/50ML-% IV SOLN
20.0000 mg | Freq: Two times a day (BID) | INTRAVENOUS | Status: AC
  Administered 2012-09-27: 20 mg via INTRAVENOUS

## 2012-09-27 MED ORDER — SODIUM CHLORIDE 0.9 % IV SOLN: INTRAVENOUS | Status: AC

## 2012-09-27 MED ORDER — INSULIN ASPART 100 UNIT/ML ~~LOC~~ SOLN
0.0000 [IU] | SUBCUTANEOUS | Status: AC
  Administered 2012-09-27: 2 [IU] via SUBCUTANEOUS

## 2012-09-27 MED ORDER — METOPROLOL TARTRATE 12.5 MG HALF TABLET
12.5000 mg | ORAL_TABLET | Freq: Two times a day (BID) | ORAL | Status: DC
  Administered 2012-09-28 (×2): 12.5 mg via ORAL
  Filled 2012-09-27 (×5): qty 1

## 2012-09-27 MED ORDER — LACTATED RINGERS IV SOLN
INTRAVENOUS | Status: DC
  Administered 2012-09-27: 18:00:00 via INTRAVENOUS

## 2012-09-27 MED ORDER — ACETAMINOPHEN 500 MG PO TABS
1000.0000 mg | ORAL_TABLET | Freq: Four times a day (QID) | ORAL | Status: DC
  Administered 2012-09-28 – 2012-10-02 (×14): 1000 mg via ORAL
  Filled 2012-09-27 (×18): qty 2

## 2012-09-27 MED ORDER — ALBUMIN HUMAN 5 % IV SOLN
250.0000 mL | INTRAVENOUS | Status: AC | PRN
  Administered 2012-09-27 (×2): 250 mL via INTRAVENOUS

## 2012-09-27 MED ORDER — 0.9 % SODIUM CHLORIDE (POUR BTL) OPTIME
TOPICAL | Status: DC | PRN
  Administered 2012-09-27 (×2): 1000 mL
  Administered 2012-09-27: 5000 mL

## 2012-09-27 MED ORDER — MIDAZOLAM HCL 2 MG/2ML IJ SOLN
INTRAMUSCULAR | Status: AC
  Filled 2012-09-27: qty 2

## 2012-09-27 MED ORDER — GLUTARALDEHYDE 0.625% SOAKING SOLUTION
Freq: Once | TOPICAL | Status: DC
  Filled 2012-09-27: qty 50

## 2012-09-27 MED ORDER — VANCOMYCIN HCL IN DEXTROSE 1-5 GM/200ML-% IV SOLN
1000.0000 mg | Freq: Once | INTRAVENOUS | Status: AC
  Administered 2012-09-27: 1000 mg via INTRAVENOUS
  Filled 2012-09-27: qty 200

## 2012-09-27 MED ORDER — ACETAMINOPHEN 10 MG/ML IV SOLN
1000.0000 mg | Freq: Once | INTRAVENOUS | Status: AC
  Administered 2012-09-27: 1000 mg via INTRAVENOUS
  Filled 2012-09-27: qty 100

## 2012-09-27 MED ORDER — CALCIUM CHLORIDE 10 % IV SOLN
1.0000 g | Freq: Once | INTRAVENOUS | Status: AC | PRN
  Filled 2012-09-27: qty 10

## 2012-09-27 MED ORDER — INSULIN REGULAR BOLUS VIA INFUSION
0.0000 [IU] | Freq: Three times a day (TID) | INTRAVENOUS | Status: DC
  Filled 2012-09-27: qty 10

## 2012-09-27 MED ORDER — SODIUM CHLORIDE 0.9 % IR SOLN
Status: DC | PRN
  Administered 2012-09-27: 1000 mL
  Administered 2012-09-27: 3000 mL

## 2012-09-27 MED ORDER — METOPROLOL TARTRATE 25 MG/10 ML ORAL SUSPENSION
12.5000 mg | Freq: Two times a day (BID) | ORAL | Status: DC
  Filled 2012-09-27 (×3): qty 5

## 2012-09-27 MED ORDER — OXYCODONE HCL 5 MG PO TABS: 5.0000 mg | ORAL_TABLET | ORAL | Status: DC | PRN

## 2012-09-27 MED ORDER — PHENYLEPHRINE HCL 10 MG/ML IJ SOLN
0.0000 ug/min | INTRAVENOUS | Status: DC
  Filled 2012-09-27: qty 2

## 2012-09-27 MED ORDER — LACTATED RINGERS IV SOLN
INTRAVENOUS | Status: DC | PRN
  Administered 2012-09-27: 07:00:00 via INTRAVENOUS

## 2012-09-27 MED ORDER — SODIUM CHLORIDE 0.45 % IV SOLN
INTRAVENOUS | Status: DC
  Administered 2012-09-27 – 2012-09-29 (×2): via INTRAVENOUS

## 2012-09-27 MED ORDER — BUPIVACAINE 0.5 % ON-Q PUMP SINGLE CATH 400 ML
400.0000 mL | INJECTION | Status: DC
  Filled 2012-09-27: qty 400

## 2012-09-27 MED ORDER — SODIUM CHLORIDE 0.9 % IV SOLN
INTRAVENOUS | Status: DC
  Filled 2012-09-27: qty 1

## 2012-09-27 MED ORDER — MORPHINE SULFATE 2 MG/ML IJ SOLN
2.0000 mg | INTRAMUSCULAR | Status: DC | PRN
  Administered 2012-09-28: 2 mg via INTRAVENOUS
  Filled 2012-09-27: qty 2
  Filled 2012-09-27: qty 1

## 2012-09-27 MED ORDER — METOPROLOL TARTRATE 1 MG/ML IV SOLN: 2.5000 mg | INTRAVENOUS | Status: DC | PRN

## 2012-09-27 MED ORDER — PANTOPRAZOLE SODIUM 40 MG PO TBEC: 40.0000 mg | DELAYED_RELEASE_TABLET | Freq: Every day | ORAL | Status: DC

## 2012-09-27 MED ORDER — DEXTROSE 5 % IV SOLN
1.5000 g | Freq: Two times a day (BID) | INTRAVENOUS | Status: AC
  Administered 2012-09-27 – 2012-09-29 (×4): 1.5 g via INTRAVENOUS
  Filled 2012-09-27 (×4): qty 1.5

## 2012-09-27 MED ORDER — INSULIN ASPART 100 UNIT/ML ~~LOC~~ SOLN
0.0000 [IU] | SUBCUTANEOUS | Status: DC
  Administered 2012-09-27: 4 [IU] via SUBCUTANEOUS
  Administered 2012-09-28 (×2): 2 [IU] via SUBCUTANEOUS

## 2012-09-27 MED ORDER — MORPHINE SULFATE 2 MG/ML IJ SOLN: 1.0000 mg | INTRAMUSCULAR | Status: AC | PRN

## 2012-09-27 MED ORDER — LACTATED RINGERS IV SOLN
INTRAVENOUS | Status: DC | PRN
  Administered 2012-09-27: 06:00:00 via INTRAVENOUS

## 2012-09-27 MED ORDER — SODIUM CHLORIDE 0.9 % IV SOLN
INTRAVENOUS | Status: DC | PRN
  Administered 2012-09-27: 14:00:00 via INTRAVENOUS

## 2012-09-27 MED ORDER — MIDAZOLAM HCL 2 MG/2ML IJ SOLN: 2.0000 mg | INTRAMUSCULAR | Status: DC | PRN

## 2012-09-27 MED ORDER — BISACODYL 10 MG RE SUPP: 10.0000 mg | Freq: Every day | RECTAL | Status: DC

## 2012-09-27 MED ORDER — DOCUSATE SODIUM 100 MG PO CAPS
200.0000 mg | ORAL_CAPSULE | Freq: Every day | ORAL | Status: DC
  Administered 2012-09-29 – 2012-10-02 (×4): 200 mg via ORAL
  Filled 2012-09-27 (×4): qty 2

## 2012-09-27 MED ORDER — POTASSIUM CHLORIDE 10 MEQ/50ML IV SOLN
10.0000 meq | Freq: Once | INTRAVENOUS | Status: AC
  Administered 2012-09-27: 10 meq via INTRAVENOUS

## 2012-09-27 MED ORDER — ONDANSETRON HCL 4 MG/2ML IJ SOLN
4.0000 mg | Freq: Four times a day (QID) | INTRAMUSCULAR | Status: DC | PRN
  Administered 2012-09-27 – 2012-09-29 (×3): 4 mg via INTRAVENOUS
  Filled 2012-09-27 (×4): qty 2

## 2012-09-27 MED ORDER — ASPIRIN EC 325 MG PO TBEC
325.0000 mg | DELAYED_RELEASE_TABLET | Freq: Every day | ORAL | Status: DC
  Administered 2012-09-28: 325 mg via ORAL
  Filled 2012-09-27 (×2): qty 1

## 2012-09-27 MED ORDER — GLUTARALDEHYDE 0.625% SOAKING SOLUTION
TOPICAL | Status: DC | PRN
  Administered 2012-09-27: 1 via TOPICAL

## 2012-09-27 MED ORDER — ACETAMINOPHEN 160 MG/5ML PO SOLN: 975.0000 mg | Freq: Four times a day (QID) | ORAL | Status: DC

## 2012-09-27 MED ORDER — SODIUM CHLORIDE 0.9 % IV SOLN: INTRAVENOUS | Status: DC

## 2012-09-27 MED ORDER — ASPIRIN 81 MG PO CHEW: 324.0000 mg | CHEWABLE_TABLET | Freq: Every day | ORAL | Status: DC

## 2012-09-27 MED ORDER — CHLORHEXIDINE GLUCONATE 4 % EX LIQD: 30.0000 mL | CUTANEOUS | Status: DC

## 2012-09-27 MED ORDER — FENTANYL CITRATE 0.05 MG/ML IJ SOLN
INTRAMUSCULAR | Status: DC | PRN
  Administered 2012-09-27: 250 ug via INTRAVENOUS
  Administered 2012-09-27: 500 ug via INTRAVENOUS
  Administered 2012-09-27: 100 ug via INTRAVENOUS
  Administered 2012-09-27: 250 ug via INTRAVENOUS
  Administered 2012-09-27: 100 ug via INTRAVENOUS
  Administered 2012-09-27: 250 ug via INTRAVENOUS

## 2012-09-27 MED ORDER — PROTAMINE SULFATE 10 MG/ML IV SOLN
INTRAVENOUS | Status: DC | PRN
  Administered 2012-09-27: 155 mg via INTRAVENOUS
  Administered 2012-09-27: 5 mg via INTRAVENOUS

## 2012-09-27 MED ORDER — SODIUM CHLORIDE 0.9 % IJ SOLN: 3.0000 mL | INTRAMUSCULAR | Status: DC | PRN

## 2012-09-27 MED ORDER — VECURONIUM BROMIDE 10 MG IV SOLR
INTRAVENOUS | Status: DC | PRN
  Administered 2012-09-27 (×2): 6 mg via INTRAVENOUS
  Administered 2012-09-27: 8 mg via INTRAVENOUS

## 2012-09-27 MED ORDER — MAGNESIUM SULFATE 40 MG/ML IJ SOLN
4.0000 g | Freq: Once | INTRAMUSCULAR | Status: AC
  Administered 2012-09-27: 4 g via INTRAVENOUS
  Filled 2012-09-27: qty 100

## 2012-09-27 SURGICAL SUPPLY — 128 items
ADAPTER CARDIO PERF ANTE/RETRO (ADAPTER) ×3 IMPLANT
ADH SKN CLS APL DERMABOND .7 (GAUZE/BANDAGES/DRESSINGS) ×2
ADPR PRFSN 84XANTGRD RTRGD (ADAPTER) ×2
APL SKNCLS STERI-STRIP NONHPOA (GAUZE/BANDAGES/DRESSINGS) ×2
BAG DECANTER FOR FLEXI CONT (MISCELLANEOUS) ×3 IMPLANT
BENZOIN TINCTURE PRP APPL 2/3 (GAUZE/BANDAGES/DRESSINGS) ×3 IMPLANT
BLADE SURG 11 STRL SS (BLADE) ×3 IMPLANT
CANISTER SUCTION 2500CC (MISCELLANEOUS) ×6 IMPLANT
CANNULA FEM VENOUS REMOTE 22FR (CANNULA) ×1 IMPLANT
CANNULA FEMORAL ART 14 SM (MISCELLANEOUS) ×3 IMPLANT
CANNULA GUNDRY RCSP 15FR (MISCELLANEOUS) ×3 IMPLANT
CANNULA OPTISITE PERFUSION 16F (CANNULA) IMPLANT
CANNULA OPTISITE PERFUSION 18F (CANNULA) ×1 IMPLANT
CARDIAC SUCTION (MISCELLANEOUS) ×3 IMPLANT
CATH KIT ON Q 5IN SLV (PAIN MANAGEMENT) IMPLANT
CLOTH BEACON ORANGE TIMEOUT ST (SAFETY) ×3 IMPLANT
CONN ST 1/4X3/8  BEN (MISCELLANEOUS) ×2
CONN ST 1/4X3/8 BEN (MISCELLANEOUS) ×4 IMPLANT
CONT SPEC STER OR (MISCELLANEOUS) ×3 IMPLANT
COVER MAYO STAND STRL (DRAPES) ×3 IMPLANT
COVER PROBE W GEL 5X96 (DRAPES) ×1 IMPLANT
COVER SURGICAL LIGHT HANDLE (MISCELLANEOUS) ×6 IMPLANT
CRADLE DONUT ADULT HEAD (MISCELLANEOUS) ×3 IMPLANT
DERMABOND ADVANCED (GAUZE/BANDAGES/DRESSINGS) ×1
DERMABOND ADVANCED .7 DNX12 (GAUZE/BANDAGES/DRESSINGS) ×4 IMPLANT
DEVICE PMI PUNCTURE CLOSURE (MISCELLANEOUS) ×3 IMPLANT
DEVICE TROCAR PUNCTURE CLOSURE (ENDOMECHANICALS) ×3 IMPLANT
DRAIN CHANNEL 28F RND 3/8 FF (WOUND CARE) ×4 IMPLANT
DRAPE BILATERAL SPLIT (DRAPES) ×3 IMPLANT
DRAPE C-ARM 42X72 X-RAY (DRAPES) ×3 IMPLANT
DRAPE CV SPLIT W-CLR ANES SCRN (DRAPES) ×3 IMPLANT
DRAPE INCISE IOBAN 66X45 STRL (DRAPES) ×7 IMPLANT
DRAPE PROXIMA HALF (DRAPES) ×1 IMPLANT
DRAPE SLUSH MACHINE 52X66 (DRAPES) IMPLANT
DRAPE SLUSH/WARMER DISC (DRAPES) ×1 IMPLANT
DRSG COVADERM 4X8 (GAUZE/BANDAGES/DRESSINGS) ×2 IMPLANT
ELECT BLADE 6.5 EXT (BLADE) ×3 IMPLANT
ELECT REM PT RETURN 9FT ADLT (ELECTROSURGICAL) ×6
ELECTRODE REM PT RTRN 9FT ADLT (ELECTROSURGICAL) ×4 IMPLANT
FEMORAL VENOUS CANN RAP (CANNULA) IMPLANT
GLOVE BIO SURGEON STRL SZ 6 (GLOVE) ×5 IMPLANT
GLOVE BIO SURGEON STRL SZ 6.5 (GLOVE) ×1 IMPLANT
GLOVE BIO SURGEON STRL SZ7 (GLOVE) ×1 IMPLANT
GLOVE BIOGEL PI IND STRL 6 (GLOVE) IMPLANT
GLOVE BIOGEL PI IND STRL 6.5 (GLOVE) IMPLANT
GLOVE BIOGEL PI INDICATOR 6 (GLOVE) ×6
GLOVE BIOGEL PI INDICATOR 6.5 (GLOVE) ×2
GLOVE ORTHO TXT STRL SZ7.5 (GLOVE) ×9 IMPLANT
GOWN STRL NON-REIN LRG LVL3 (GOWN DISPOSABLE) ×18 IMPLANT
GRAFT PTCH CORMATRIX 7X10 4PLY (Prosthesis & Implant Heart) ×1 IMPLANT
GUIDEWIRE ANG ZIPWIRE 038X150 (WIRE) IMPLANT
INSERT CONFORM CROSS CLAMP 66M (MISCELLANEOUS) ×2 IMPLANT
INSERT CONFORM CROSS CLAMP 86M (MISCELLANEOUS) ×2 IMPLANT
KIT BASIN OR (CUSTOM PROCEDURE TRAY) ×3 IMPLANT
KIT DILATOR VASC 18G NDL (KITS) ×3 IMPLANT
KIT DRAINAGE VACCUM ASSIST (KITS) ×1 IMPLANT
KIT ROOM TURNOVER OR (KITS) ×3 IMPLANT
KIT SUCTION CATH 14FR (SUCTIONS) ×3 IMPLANT
LEAD PACING MYOCARDI (MISCELLANEOUS) ×3 IMPLANT
LINE VENT (MISCELLANEOUS) ×1 IMPLANT
NDL AORTIC ROOT 14G 7F (CATHETERS) ×2 IMPLANT
NEEDLE AORTIC ROOT 14G 7F (CATHETERS) ×3 IMPLANT
NS IRRIG 1000ML POUR BTL (IV SOLUTION) ×13 IMPLANT
PACK OPEN HEART (CUSTOM PROCEDURE TRAY) ×3 IMPLANT
PAD ARMBOARD 7.5X6 YLW CONV (MISCELLANEOUS) ×6 IMPLANT
PAD ELECT DEFIB RADIOL ZOLL (MISCELLANEOUS) ×3 IMPLANT
RETRACTOR PVL SOFT TISSUE LG (INSTRUMENTS) ×1 IMPLANT
RETRACTOR TRL SOFT TISSUE LG (INSTRUMENTS) IMPLANT
RETRACTOR TRM SOFT TISSUE 7.5 (INSTRUMENTS) IMPLANT
RING MITRAL MEMO 3D 30MM SMD30 (Prosthesis & Implant Heart) ×1 IMPLANT
SET CANNULATION TOURNIQUET (MISCELLANEOUS) ×3 IMPLANT
SET CARDIOPLEGIA MPS 5001102 (MISCELLANEOUS) ×1 IMPLANT
SET IRRIG TUBING LAPAROSCOPIC (IRRIGATION / IRRIGATOR) ×3 IMPLANT
SOLUTION ANTI FOG 6CC (MISCELLANEOUS) ×3 IMPLANT
SORIN GROUP ANNULOPLASTY RING HOLDER HANDLE ×1 IMPLANT
SPONGE GAUZE 4X4 12PLY (GAUZE/BANDAGES/DRESSINGS) ×3 IMPLANT
SUCKER WEIGHTED FLEX (MISCELLANEOUS) ×6 IMPLANT
SUT BONE WAX W31G (SUTURE) ×3 IMPLANT
SUT E-PACK MINIMALLY INVASIVE (SUTURE) ×3 IMPLANT
SUT ETHIBOND (SUTURE) ×2 IMPLANT
SUT ETHIBOND 2 0 SH (SUTURE) ×1 IMPLANT
SUT ETHIBOND 2 0 V4 (SUTURE) IMPLANT
SUT ETHIBOND 2 0V4 GREEN (SUTURE) IMPLANT
SUT ETHIBOND 2-0 RB-1 WHT (SUTURE) ×2 IMPLANT
SUT ETHIBOND 4 0 TF (SUTURE) IMPLANT
SUT ETHIBOND 5 0 C 1 30 (SUTURE) IMPLANT
SUT ETHIBOND NAB MH 2-0 36IN (SUTURE) IMPLANT
SUT ETHIBOND X763 2 0 SH 1 (SUTURE) ×3 IMPLANT
SUT GORETEX 6.0 TH-9 30 IN (SUTURE) IMPLANT
SUT GORETEX CV 4 TH 22 36 (SUTURE) ×3 IMPLANT
SUT GORETEX CV-5THC-13 36IN (SUTURE) ×11 IMPLANT
SUT GORETEX CV4 TH-18 (SUTURE) ×8 IMPLANT
SUT GORETEX TH-18 36 INCH (SUTURE) IMPLANT
SUT MNCRL AB 3-0 PS2 18 (SUTURE) IMPLANT
SUT PROLENE 3 0 SH DA (SUTURE) IMPLANT
SUT PROLENE 3 0 SH1 36 (SUTURE) ×4 IMPLANT
SUT PROLENE 4 0 RB 1 (SUTURE) ×3
SUT PROLENE 4-0 RB1 .5 CRCL 36 (SUTURE) IMPLANT
SUT PROLENE 5 0 C 1 36 (SUTURE) IMPLANT
SUT PROLENE 6 0 C 1 30 (SUTURE) IMPLANT
SUT SILK  1 MH (SUTURE)
SUT SILK 1 MH (SUTURE) IMPLANT
SUT SILK 1 TIES 10X30 (SUTURE) IMPLANT
SUT SILK 2 0 SH CR/8 (SUTURE) IMPLANT
SUT SILK 2 0 TIES 10X30 (SUTURE) IMPLANT
SUT SILK 2 0SH CR/8 30 (SUTURE) ×1 IMPLANT
SUT SILK 3 0 (SUTURE)
SUT SILK 3 0 SH CR/8 (SUTURE) IMPLANT
SUT SILK 3 0SH CR/8 30 (SUTURE) IMPLANT
SUT SILK 3-0 18XBRD TIE 12 (SUTURE) IMPLANT
SUT TEM PAC WIRE 2 0 SH (SUTURE) IMPLANT
SUT VIC AB 2-0 CTX 36 (SUTURE) IMPLANT
SUT VIC AB 2-0 UR6 27 (SUTURE) IMPLANT
SUT VIC AB 3-0 SH 8-18 (SUTURE) IMPLANT
SUT VICRYL 2 TP 1 (SUTURE) IMPLANT
SYRINGE 10CC LL (SYRINGE) ×3 IMPLANT
SYSTEM SAHARA CHEST DRAIN ATS (WOUND CARE) ×3 IMPLANT
TAPE CLOTH SURG 4X10 WHT LF (GAUZE/BANDAGES/DRESSINGS) ×1 IMPLANT
TOWEL OR 17X24 6PK STRL BLUE (TOWEL DISPOSABLE) ×3 IMPLANT
TOWEL OR 17X26 10 PK STRL BLUE (TOWEL DISPOSABLE) ×3 IMPLANT
TRAY FOLEY IC TEMP SENS 14FR (CATHETERS) ×3 IMPLANT
TROCAR XCEL BLADELESS 5X75MML (TROCAR) ×3 IMPLANT
TROCAR XCEL NON-BLD 11X100MML (ENDOMECHANICALS) ×6 IMPLANT
TUBE SUCT INTRACARD DLP 20F (MISCELLANEOUS) ×3 IMPLANT
TUNNELER SHEATH ON-Q 11GX8 (MISCELLANEOUS) IMPLANT
UNDERPAD 30X30 INCONTINENT (UNDERPADS AND DIAPERS) ×3 IMPLANT
WATER STERILE IRR 1000ML POUR (IV SOLUTION) ×6 IMPLANT
WIRE BENTSON .035X145CM (WIRE) ×3 IMPLANT

## 2012-09-27 NOTE — Op Note (Signed)
CARDIOTHORACIC SURGERY OPERATIVE NOTE  Date of Procedure:  09/27/2012  Preoperative Diagnosis: Severe Mitral Regurgitation  Postoperative Diagnosis: Same  Procedure:    Minimally-Invasive Mitral Valve Repair  Complex valvuloplasty including triangular resection of flail posterior leaflet  Sorin Memo 3D Ring Annuloplasty (size 30mm, catalog # [id number], serial # [id number])   Surgeon: Salvatore Decent. 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 The patient states that he was first told he had a heart murmur at least 15 years ago. 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 Blood cultures grew strep viridans and transesophageal echocardiogram at the time confirmed what appeared to be vegetation on the mitral valve with moderate to severe mitral regurgitation. He has been followed carefully ever since in the [facility] cardiology office and recently return to see Dr. [name] complaining of recent onset exertional shortness of breath.  He underwent transesophageal echocardiogram and left and right heart catheterization by Dr. [name] last week. This confirmed the presence of mitral valve prolapse with flail segment of the posterior leaflet and severe mitral regurgitation. Left ventricular function appears reasonably well preserved with ejection fraction estimated 55-60%.  Cardiac catheterization is notable for single-vessel coronary artery disease with high-grade 95%  stenosis of the mid right coronary artery.  He was originally seen in consultation on 08/22/2012. Since then he underwent PCI and stenting of the right coronary artery by Dr. [name] on 08/25/2012.  He has done quite well since then and he returns to the office today hoping to proceed with elective mitral valve repair in the near future. The patient has been counseled at length regarding the indications, risks and potential benefits of surgery.  All questions have been answered, and the patient provides full informed consent for the operation as described.     DETAILS OF THE OPERATIVE PROCEDURE  The patient is brought to the operating room on the above mentioned date and central monitoring was established by the anesthesia team including placement of Swan-Ganz catheter through the left internal jugular vein.  A radial arterial line is placed. The patient is placed in the supine position on the operating table.  Intravenous antibiotics are administered. General endotracheal anesthesia is induced uneventfully. The patient is initially intubated using a dual lumen endotracheal tube.  A Foley catheter is placed.  Baseline transesophageal echocardiogram was performed.  Findings were notable for fibroelastic deficiency type degenerative disease of the mitral valve with obvious flail segment (P2) of the posterior leaflet and severe (4+) mitral regurgitation. Left ventricular systolic function appears normal. There is trace aortic insufficiency. No other significant abnormalities are noted.  A soft roll is placed behind the patient's left scapula and the neck gently extended and turned to the left.   The patient's right neck, chest, abdomen, both groins, and both lower extremities are prepared and draped in a sterile manner. A time out procedure is performed.  A small incision is made in the right inguinal crease and the anterior surface of the right common femoral artery  and right common femoral vein are  identified.  A right miniature anterolateral thoracotomy incision is performed. The incision is placed just lateral to and superior to the right nipple. The pectoralis major muscle is retracted medially and completely preserved. The right pleural space is entered through the 3rd intercostal space. A soft tissue retractor is placed.  Two 11 mm ports are placed through separate stab incisions inferiorly. The right pleural space is insufflated continuously with carbon dioxide gas through the posterior port during the remainder of the operation.  A pledgeted sutures placed through the dome of the right hemidiaphragm and retracted inferiorly to facilitate exposure.  A longitudinal incision is made in the pericardium 3 cm anterior to the phrenic nerve and silk traction sutures are placed on either side of the incision for exposure.  The patient is placed in Trendelenburg position. The right internal jugular vein is cannulated with Seldinger technique and a guidewire advanced into the right atrium. The patient is heparinized systemically. The right internal jugular vein is cannulated with a 14 [place] pediatric femoral venous cannula. Pursestring sutures are placed on the anterior surface of the right common femoral vein and right common femoral artery. The right common femoral vein is cannulated with the Seldinger technique and a guidewire is advanced under transesophageal echocardiogram guidance through the right atrium. The femoral vein is cannulated with a long 22 French femoral venous cannula. The right common femoral artery is cannulated with Seldinger technique and a flexible guidewire is advanced until it can be appreciated intraluminally in the descending thoracic aorta on transesophageal echocardiogram. The femoral artery is cannulated with an 18 French femoral arterial cannula.  Adequate heparinization is verified.     The entire pre-bypass portion of the operation was notable for stable  hemodynamics.  Cardiopulmonary bypass was begun.  Vacuum assist venous drainage is utilized. The incision in the pericardium is extended in both directions. Venous drainage and exposure are notably excellent. A retrograde cardioplegia cannula is placed through the right atrium into the coronary sinus using transesophageal echocardiogram guidance.  An antegrade cardioplegia cannula is placed in the ascending aorta.    The patient is cooled to 28C systemic temperature.  The aortic cross clamp is applied and cold blood cardioplegia is delivered initially in an antegrade fashion through the aortic root.   Supplemental cardioplegia is given retrograde through the coronary sinus catheter. The initial cardioplegic arrest is rapid with early diastolic arrest.  Repeat doses of cardioplegia are administered intermittently every 20 to 30 minutes throughout the entire cross clamp portion of the operation through the aortic root and through the coronary sinus catheter in order to maintain completely flat electrocardiogram.  Myocardial protection was felt to be excellent.  A left atriotomy incision was performed through the interatrial groove and extended partially across the back wall of the left atrium after opening the oblique sinus inferiorly.  The mitral valve is exposed using a self-retaining retractor.  The mitral valve is inspected and noted to have classical fibroelastic deficiency type degenerative disease. The majority of the middle scallop (P2) of the posterior leaflet was flail with multiple ruptured primary cords. The remainder of all of the leaflet segments are otherwise fairly normal with no other significant areas of prolapse.  Interrupted 2-0 Ethibond horizontal mattress sutures are placed circumferentially around the entire mitral valve annulus. The sutures will ultimately be utilized for ring annuloplasty, and at this juncture there are utilized to suspend the valve symmetrically.  The flail segment  of the posterior  leaflet is repaired using a simple triangular resection. Approximately 40% of the total surface area of the middle scallop (P2) is resected.  The intervening vertical defect in the leaflet is closed with interrupted everting CV 5 Gore-Tex sutures.  The valve was tested with saline and appeared competent even without ring annuloplasty complete. The valve was sized to a 30 mm annuloplasty ring, based upon the transverse distance between the left and right commissures and the height of the anterior leaflet, corresponding to a size just slightly larger than the overall surface area of the anterior leaflet.  A Sorin Memo 3D annuloplasty ring (size 30mm, catalog #[id number], serial [id number]) was secured in place uneventfully. The valve was tested with saline and appeared competent. There is no residual leak. There was a broad, symmetrical line of coaptation of the anterior and posterior leaflet which was confirmed using the blue ink test.  Rewarming is begun.  The atriotomy was closed using a 2-layer closure of running 3-0 Prolene suture after placing a sump drain across the mitral valve to serve as a left ventricular vent.  One final dose of warm retrograde "hot shot" cardioplegia was administered retrograde through the coronary sinus catheter while all air was evacuated through the aortic root.  The aortic cross clamp was removed after a total cross clamp time of 132 minutes.  Epicardial pacing wires are fixed to the inferior wall of the right ventricule and to the right atrial appendage. The patient is rewarmed to 37C temperature. The left ventricular vent is removed.  The patient is ventilated and flow volumes turndown while the mitral valve repair is inspected using transesophageal echocardiogram. The valve repair appears intact with no residual leak. The antegrade cardioplegia cannula is now removed. The patient is weaned and disconnected from cardiopulmonary bypass.  The patient's rhythm at  separation from bypass was AV paced.  The patient was weaned from bypass without any inotropic support. Total cardiopulmonary bypass time for the operation was 185 minutes.  Followup transesophageal echocardiogram performed after separation from bypass revealed a well-seated annuloplasty ring in the mitral position with a normal functioning mitral valve. There was no residual leak.  Left ventricular function was unchanged from preoperatively.    The femoral arterial and venous cannulae were removed uneventfully. There was a palpable pulse in the distal right common femoral artery after removal of the cannula. Protamine was administered to reverse the anticoagulation. The right internal jugular cannula was removed and manual pressure held on the neck for 15 minutes.  Single lung ventilation was begun. The atriotomy closure was inspected for hemostasis. The pericardial sac was drained using a 28 French Bard drain placed through the anterior port incision.  The pericardium was closed using a patch of core matrix bovine submucosal tissue patch. The right pleural space is irrigated with saline solution and inspected for hemostasis. The right pleural space was drained using a 28 French Bard drain placed through the posterior port incision. The miniature thoracotomy incision was closed in multiple layers in routine fashion. The right groin incision was inspected for hemostasis and closed in multiple layers in routine fashion.  The patient tolerated the procedure well.  The patient was reintubated using a single lumen endotracheal tube and subsequently transported to the surgical intensive care unit in stable condition. There were no intraoperative complications. All sponge instrument and needle counts are verified correct at completion of the operation.  The post-bypass portion of the operation was notable for stable rhythm and hemodynamics.  No blood products  were administered during the operation.   Salvatore Decent. Cornelius Moras MD 09/27/2012 3:50 PM

## 2012-09-27 NOTE — Anesthesia Postprocedure Evaluation (Signed)
  Anesthesia Post-op Note  Patient: Christopher [name]  Procedure(s) Performed: Procedure(s) with comments: MINIMALLY INVASIVE MITRAL VALVE REPAIR (MVR) (Right) - Ultrasound guided INTRAOPERATIVE TRANSESOPHAGEAL ECHOCARDIOGRAM (N/A)  Patient Location: PACU  Anesthesia Type:General  Level of Consciousness: sedated and Patient remains intubated per anesthesia plan  Airway and Oxygen Therapy: Patient remains intubated per anesthesia plan and Patient placed on Ventilator (see vital sign flow sheet for setting)  Post-op Pain: none  Post-op Assessment: Post-op Vital signs reviewed, Patient's Cardiovascular Status Stable, Respiratory Function Stable, Patent Airway, No signs of Nausea or vomiting and Pain level controlled  Post-op Vital Signs: Reviewed and stable  Complications: No apparent anesthesia complications

## 2012-09-27 NOTE — Anesthesia Preprocedure Evaluation (Addendum)
Anesthesia Evaluation  Patient identified by MRN, date of birth, ID band Patient awake    Reviewed: Allergy & Precautions, H&P , NPO status , Patient's Chart, lab work & pertinent test results  History of Anesthesia Complications Negative for: history of anesthetic complications  Airway Mallampati: III TM Distance: >3 FB Neck ROM: Full  Mouth opening: Limited Mouth Opening  Dental  (+) Teeth Intact and Dental Advisory Given   Pulmonary sleep apnea and Continuous Positive Airway Pressure Ventilation , former smoker,  breath sounds clear to auscultation  Pulmonary exam normal       Cardiovascular Exercise Tolerance: Poor hypertension, Pt. on medications + CAD and + Cardiac Stents + Valvular Problems/Murmurs Rhythm:Regular Rate:Normal  EF 55-60% 3-4+ MR s/p ptca RCA, endocarditis 09 EKG- SB LAFB, Bifasciular block   ECHO: flail segment of post leaflet of mitral valve, EF 55-60%   Neuro/Psych negative neurological ROS  negative psych ROS   GI/Hepatic Neg liver ROS, GERD-  Medicated and Controlled,  Endo/Other  Morbid obesity  Renal/GU negative Renal ROS     Musculoskeletal   Abdominal (+) + obese,   Peds  Hematology   Anesthesia Other Findings   Reproductive/Obstetrics                          Anesthesia Physical Anesthesia Plan  ASA: III  Anesthesia Plan: General   Post-op Pain Management:    Induction: Intravenous  Airway Management Planned: Double Lumen EBT  Additional Equipment: Arterial line, CVP, PA Cath, Ultrasound Guidance Line Placement and 3D TEE  Intra-op Plan:   Post-operative Plan: Post-operative intubation/ventilation  Informed Consent: I have reviewed the patients History and Physical, chart, labs and discussed the procedure including the risks, benefits and alternatives for the proposed anesthesia with the patient or authorized representative who has indicated his/her  understanding and acceptance.   Dental advisory given  Plan Discussed with: CRNA, Anesthesiologist and Surgeon  Anesthesia Plan Comments: (Plan routine monitors, A-line, PA cath, GETA with TEE and post op ventilation)       Anesthesia Quick Evaluation

## 2012-09-27 NOTE — Procedures (Signed)
Extubation Procedure Note  Patient Details:   Name: Christopher [name] DOB: Jan 15, 1951 MRN: [id number]   Airway Documentation:  Airway 8 mm (Active)  Secured at (cm) 22 cm 09/27/2012  9:11 PM  Measured From Lips 09/27/2012  9:11 PM  Secured Location Right 09/27/2012  9:11 PM  Secured By [employer] Tape 09/27/2012  9:11 PM  Cuff Pressure (cm H2O) 28 cm H2O 09/27/2012  3:45 PM  Site Condition Dry 09/27/2012  9:11 PM    Evaluation  O2 sats: stable throughout and currently acceptable Complications: No apparent complications Patient did tolerate procedure well. Bilateral Breath Sounds: Clear;Diminished   Yes Pt extubated to 4 lpm nasal cannula by rapid wean protocol. Pt met weaning parameters with NIF -20 and VC a little over 1 liter. Positive cuff leak, no stridor noted, clear ability to speak. No apparent distress at this time. Pt o2 sats remained stable throughout procedure.  ABG    Component Value Date/Time   PHART 7.316* 09/27/2012 2125   PCO2ART 44.9 09/27/2012 2125   PO2ART 85.0 09/27/2012 2125   HCO3 22.7 09/27/2012 2125   TCO2 24 09/27/2012 2130   ACIDBASEDEF 3.0* 09/27/2012 2125   O2SAT 95.0 09/27/2012 2125     HUDSON, [name] 09/27/2012, 9:47 PM

## 2012-09-27 NOTE — Transfer of Care (Signed)
Immediate Anesthesia Transfer of Care Note  Patient: JERAD DUNLAP  Procedure(s) Performed: Procedure(s) with comments: MINIMALLY INVASIVE MITRAL VALVE REPAIR (MVR) (Right) - Ultrasound guided INTRAOPERATIVE TRANSESOPHAGEAL ECHOCARDIOGRAM (N/A)  Patient Location: ICU  Anesthesia Type:General  Level of Consciousness: sedated and unresponsive  Airway & Oxygen Therapy: Patient remains intubated per anesthesia plan and Patient placed on Ventilator (see vital sign flow sheet for setting)  Post-op Assessment: Report given to PACU RN and Post -op Vital signs reviewed and stable  Post vital signs: Reviewed and stable  Complications: No apparent anesthesia complications

## 2012-09-27 NOTE — Progress Notes (Signed)
  Echocardiogram Echocardiogram Transesophageal has been performed.  Christopher [name] 09/27/2012, 9:00 AM

## 2012-09-27 NOTE — Interval H&P Note (Signed)
History and Physical Interval Note:  09/27/2012 7:34 AM  Christopher [name]  has presented today for surgery, with the diagnosis of MR  The various methods of treatment have been discussed with the patient and family. After consideration of risks, benefits and other options for treatment, the patient has consented to  Procedure(s): MINIMALLY INVASIVE MITRAL VALVE REPAIR (MVR) (Right) INTRAOPERATIVE TRANSESOPHAGEAL ECHOCARDIOGRAM (N/A) as a surgical intervention .  The patient's history has been reviewed, patient examined, no change in status, stable for surgery.  I have reviewed the patient's chart and labs.  Questions were answered to the patient's satisfaction.     OWEN,CLARENCE H

## 2012-09-27 NOTE — Progress Notes (Signed)
S/p mitral repair  Intubated, starting to wean  BP 99/62  Pulse 82  Temp(Src) 100.6 F (38.1 C) (Oral)  Resp 17  Ht 5\' 10"  (1.778 m)  Wt 220 lb 7.4 oz (100 kg)  BMI 31.63 kg/m2  SpO2 96% Cardiac output 4.5  Intake/Output Summary (Last 24 hours) at 09/27/12 1928 Last data filed at 09/27/12 1900  Gross per 24 hour  Intake 4555.1 ml  Output   3510 ml  Net 1045.1 ml    Stable early postop

## 2012-09-27 NOTE — Brief Op Note (Addendum)
09/27/2012  1:13 PM  PATIENT:  Christopher [name]  62 y.o. 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

## 2012-09-27 NOTE — Anesthesia Procedure Notes (Addendum)
Procedure Name: Intubation Date/Time: 09/27/2012 8:18 AM Performed by: Ferol Luz L Pre-anesthesia Checklist: Patient identified, Emergency Drugs available, Suction available, Patient being monitored and Timeout performed Patient Re-evaluated:Patient Re-evaluated prior to inductionOxygen Delivery Method: Circle system utilized Preoxygenation: Pre-oxygenation with 100% oxygen Intubation Type: IV induction and Cricoid Pressure applied Ventilation: Two handed mask ventilation required and Oral airway inserted - appropriate to patient size Laryngoscope Size: Mac, 3 and 4 Grade View: Grade IV Tube type: Oral Endobronchial tube: Left, EBT position confirmed by fiberoptic bronchoscope, EBT position confirmed by auscultation and Double lumen EBT and 39 Fr Number of attempts: 3 Airway Equipment and Method: Bougie stylet,  Stylet,  Fiberoptic brochoscope and Oral airway Placement Confirmation: positive ETCO2,  CO2 detector and breath sounds checked- equal and bilateral Secured at: 29 (29) cm Tube secured with: Tape Dental Injury: Teeth and Oropharynx as per pre-operative assessment  Difficulty Due To: Difficulty was anticipated, Difficult Airway- due to anterior larynx, Difficult Airway- due to limited oral opening and Difficult Airway- due to reduced neck mobility Future Recommendations: Recommend- induction with short-acting agent, and alternative techniques readily available Comments: Base of arytenoids only seen under DL c heavy crichoid pressure, double lumen EBT was not able to be seen through cords due to small oropharynx      Procedure Name: Intubation Date/Time: 09/27/2012 3:30 PM Performed by: Orlinda Blalock, Jonnathan Birman L Pre-anesthesia Checklist: Patient identified, Emergency Drugs available, Suction available, Patient being monitored and Timeout performed Patient Re-evaluated:Patient Re-evaluated prior to inductionOxygen Delivery Method: Circle system utilized Preoxygenation:  Pre-oxygenation with 100% oxygen Intubation Type: Inhalational induction with existing ETT and IV induction Grade View: Grade I Tube type: Oral Tube size: 8.0 mm Number of attempts: 1 Airway Equipment and Method: Video-laryngoscopy and Bougie stylet Placement Confirmation: ETT inserted through vocal cords under direct vision,  positive ETCO2 and breath sounds checked- equal and bilateral Secured at: 22 cm Tube secured with: Tape Dental Injury: Teeth and Oropharynx as per pre-operative assessment  Comments: DLT exchanged over cook catheter for single lumen 8.0 ETT with visualization via glidescope. Dr. Jean Rosenthal present.

## 2012-09-27 NOTE — Progress Notes (Signed)
RT called to PT room to set up CPAP, however while setting the machine up the PT got nauseous and sick. PT has refused CPAP at this time, wants to wait awhile before we try it again. CPAP is in PT room set up and ready to use. RN will call RT when PT wants to try to go on the machine again.PT is currently on 4 lpm nasal cannula with o2 sats 96%. RT will continue to assist as needed.

## 2012-09-28 ENCOUNTER — Inpatient Hospital Stay (HOSPITAL_COMMUNITY): Payer: BC Managed Care – PPO

## 2012-09-28 LAB — CBC
HCT: 33.3 % — ABNORMAL LOW (ref 39.0–52.0)
HCT: 32.9 % — ABNORMAL LOW (ref 39.0–52.0)
Hemoglobin: 11.6 g/dL — ABNORMAL LOW (ref 13.0–17.0)
Hemoglobin: 11.3 g/dL — ABNORMAL LOW (ref 13.0–17.0)
MCH: 30.1 pg (ref 26.0–34.0)
MCH: 29.5 pg (ref 26.0–34.0)
MCHC: 34.8 g/dL (ref 30.0–36.0)
MCHC: 34.3 g/dL (ref 30.0–36.0)
MCV: 86.5 fL (ref 78.0–100.0)
MCV: 85.9 fL (ref 78.0–100.0)
Platelets: 102 10*3/uL — ABNORMAL LOW (ref 150–400)
Platelets: 93 10*3/uL — ABNORMAL LOW (ref 150–400)
RBC: 3.85 MIL/uL — ABNORMAL LOW (ref 4.22–5.81)
RBC: 3.83 MIL/uL — ABNORMAL LOW (ref 4.22–5.81)
RDW: 13.6 % (ref 11.5–15.5)
RDW: 13.9 % (ref 11.5–15.5)
WBC: 11.2 10*3/uL — ABNORMAL HIGH (ref 4.0–10.5)
WBC: 13.4 10*3/uL — ABNORMAL HIGH (ref 4.0–10.5)

## 2012-09-28 LAB — GLUCOSE, CAPILLARY
Glucose-Capillary: 117 mg/dL — ABNORMAL HIGH (ref 70–99)
Glucose-Capillary: 121 mg/dL — ABNORMAL HIGH (ref 70–99)
Glucose-Capillary: 126 mg/dL — ABNORMAL HIGH (ref 70–99)
Glucose-Capillary: 128 mg/dL — ABNORMAL HIGH (ref 70–99)
Glucose-Capillary: 130 mg/dL — ABNORMAL HIGH (ref 70–99)
Glucose-Capillary: 139 mg/dL — ABNORMAL HIGH (ref 70–99)
Glucose-Capillary: 143 mg/dL — ABNORMAL HIGH (ref 70–99)
Glucose-Capillary: 147 mg/dL — ABNORMAL HIGH (ref 70–99)
Glucose-Capillary: 180 mg/dL — ABNORMAL HIGH (ref 70–99)
Glucose-Capillary: 182 mg/dL — ABNORMAL HIGH (ref 70–99)
Glucose-Capillary: 99 mg/dL (ref 70–99)

## 2012-09-28 LAB — BASIC METABOLIC PANEL
BUN: 16 mg/dL (ref 6–23)
CO2: 24 mEq/L (ref 19–32)
Calcium: 7.4 mg/dL — ABNORMAL LOW (ref 8.4–10.5)
Chloride: 109 mEq/L (ref 96–112)
Creatinine, Ser: 1.03 mg/dL (ref 0.50–1.35)
GFR calc Af Amer: 87 mL/min — ABNORMAL LOW (ref >90)
GFR calc non Af Amer: 75 mL/min — ABNORMAL LOW (ref >90)
Glucose, Bld: 139 mg/dL — ABNORMAL HIGH (ref 70–99)
Potassium: 4 mEq/L (ref 3.5–5.1)
Sodium: 140 mEq/L (ref 135–145)

## 2012-09-28 LAB — POCT I-STAT, CHEM 8
BUN: 22 mg/dL (ref 6–23)
Calcium, Ion: 1.12 mmol/L — ABNORMAL LOW (ref 1.13–1.30)
Chloride: 105 mEq/L (ref 96–112)
Creatinine, Ser: 1 mg/dL (ref 0.50–1.35)
Glucose, Bld: 120 mg/dL — ABNORMAL HIGH (ref 70–99)
HCT: 31 % — ABNORMAL LOW (ref 39.0–52.0)
Hemoglobin: 10.5 g/dL — ABNORMAL LOW (ref 13.0–17.0)
Potassium: 3.7 mEq/L (ref 3.5–5.1)
Sodium: 140 mEq/L (ref 135–145)
TCO2: 29 mmol/L (ref 0–100)

## 2012-09-28 LAB — CREATININE, SERUM
Creatinine, Ser: 1.04 mg/dL (ref 0.50–1.35)
GFR calc Af Amer: 86 mL/min — ABNORMAL LOW (ref >90)
GFR calc non Af Amer: 74 mL/min — ABNORMAL LOW (ref >90)

## 2012-09-28 LAB — MAGNESIUM
Magnesium: 2.8 mg/dL — ABNORMAL HIGH (ref 1.5–2.5)
Magnesium: 2.6 mg/dL — ABNORMAL HIGH (ref 1.5–2.5)

## 2012-09-28 MED ORDER — INSULIN ASPART 100 UNIT/ML ~~LOC~~ SOLN
0.0000 [IU] | SUBCUTANEOUS | Status: DC
  Administered 2012-09-28 (×3): 2 [IU] via SUBCUTANEOUS

## 2012-09-28 MED ORDER — PROMETHAZINE HCL 25 MG/ML IJ SOLN
12.5000 mg | INTRAMUSCULAR | Status: DC | PRN
  Administered 2012-09-28: 12.5 mg via INTRAVENOUS
  Filled 2012-09-28: qty 1

## 2012-09-28 MED ORDER — POTASSIUM CHLORIDE 10 MEQ/50ML IV SOLN
10.0000 meq | INTRAVENOUS | Status: AC
  Administered 2012-09-28 (×2): 10 meq via INTRAVENOUS
  Filled 2012-09-28: qty 100

## 2012-09-28 MED ORDER — FUROSEMIDE 10 MG/ML IJ SOLN
20.0000 mg | Freq: Four times a day (QID) | INTRAMUSCULAR | Status: AC
  Administered 2012-09-28 (×3): 20 mg via INTRAVENOUS
  Filled 2012-09-28 (×3): qty 2

## 2012-09-28 MED ORDER — METOCLOPRAMIDE HCL 5 MG/ML IJ SOLN
10.0000 mg | Freq: Four times a day (QID) | INTRAMUSCULAR | Status: DC
  Administered 2012-09-28 – 2012-09-29 (×4): 10 mg via INTRAVENOUS
  Filled 2012-09-28 (×8): qty 2

## 2012-09-28 MED ORDER — MORPHINE SULFATE 2 MG/ML IJ SOLN
2.0000 mg | INTRAMUSCULAR | Status: DC | PRN
  Administered 2012-09-28 – 2012-09-30 (×2): 2 mg via INTRAVENOUS
  Filled 2012-09-28 (×2): qty 1

## 2012-09-28 MED ORDER — PANTOPRAZOLE SODIUM 40 MG IV SOLR
40.0000 mg | Freq: Two times a day (BID) | INTRAVENOUS | Status: DC
  Administered 2012-09-28 (×2): 40 mg via INTRAVENOUS
  Filled 2012-09-28 (×4): qty 40

## 2012-09-28 MED ORDER — POTASSIUM CHLORIDE 10 MEQ/50ML IV SOLN
10.0000 meq | INTRAVENOUS | Status: AC
  Administered 2012-09-28 – 2012-09-29 (×3): 10 meq via INTRAVENOUS
  Filled 2012-09-28: qty 150

## 2012-09-28 MED FILL — Magnesium Sulfate Inj 50%: INTRAMUSCULAR | Qty: 10 | Status: AC

## 2012-09-28 MED FILL — Sodium Chloride Irrigation Soln 0.9%: Qty: 3000 | Status: AC

## 2012-09-28 MED FILL — Electrolyte-R (PH 7.4) Solution: INTRAVENOUS | Qty: 4000 | Status: AC

## 2012-09-28 MED FILL — Sodium Bicarbonate IV Soln 8.4%: INTRAVENOUS | Qty: 50 | Status: AC

## 2012-09-28 MED FILL — Heparin Sodium (Porcine) Inj 1000 Unit/ML: INTRAMUSCULAR | Qty: 10 | Status: AC

## 2012-09-28 MED FILL — Potassium Chloride Inj 2 mEq/ML: INTRAVENOUS | Qty: 40 | Status: AC

## 2012-09-28 MED FILL — Sodium Chloride IV Soln 0.9%: INTRAVENOUS | Qty: 1000 | Status: AC

## 2012-09-28 MED FILL — Heparin Sodium (Porcine) Inj 1000 Unit/ML: INTRAMUSCULAR | Qty: 30 | Status: AC

## 2012-09-28 MED FILL — Mannitol IV Soln 20%: INTRAVENOUS | Qty: 500 | Status: AC

## 2012-09-28 NOTE — Progress Notes (Signed)
Patient ID: Christopher [name], male   DOB: 01-26-1951, 62 y.o.   MRN: [id number]  Hemodynamically stable.  Urine output good.  Nausea today.  CBC    Component Value Date/Time   WBC 13.4* 09/28/2012 1700   RBC 3.83* 09/28/2012 1700   HGB 10.5* 09/28/2012 1805   HCT 31.0* 09/28/2012 1805   PLT 93* 09/28/2012 1700   MCV 85.9 09/28/2012 1700   MCH 29.5 09/28/2012 1700   MCHC 34.3 09/28/2012 1700   RDW 13.9 09/28/2012 1700   LYMPHSABS 1.4 08/04/2012 1703   MONOABS 0.4 08/04/2012 1703   EOSABS 0.2 08/04/2012 1703   BASOSABS 0.0 08/04/2012 1703    BMET    Component Value Date/Time   NA 140 09/28/2012 1805   K 3.7 09/28/2012 1805   CL 105 09/28/2012 1805   CO2 24 09/28/2012 0500   GLUCOSE 120* 09/28/2012 1805   BUN 22 09/28/2012 1805   CREATININE 1.00 09/28/2012 1805   CALCIUM 7.4* 09/28/2012 0500   GFRNONAA 74* 09/28/2012 1700   GFRAA 86* 09/28/2012 1700

## 2012-09-28 NOTE — Plan of Care (Signed)
Problem: Phase II Progression Outcomes Goal: Tolerates weaning with O2 Sat > 90 Outcome: Progressing Down to 3 L. IS [phone] Goal: Tolerates liquids without nausea/vomiting Outcome: Not Progressing reglan and phenergan started

## 2012-09-28 NOTE — Progress Notes (Signed)
CARDIOTHORACIC SURGERY PROGRESS NOTE   R1 Day Post-Op Procedure(s) (LRB): MINIMALLY INVASIVE MITRAL VALVE REPAIR (MVR) (Right) INTRAOPERATIVE TRANSESOPHAGEAL ECHOCARDIOGRAM (N/A)  Subjective: Complains of severe nausea.  Otherwise looks good.  No SOB.  Mild "soreness" in chest  Objective: Vital signs: BP Readings from Last 1 Encounters:  09/28/12 122/61   Pulse Readings from Last 1 Encounters:  09/28/12 78   Resp Readings from Last 1 Encounters:  09/28/12 21   Temp Readings from Last 1 Encounters:  09/28/12 99.3 F (37.4 C) Core (Comment)    Hemodynamics: PAP: (25-35)/(12-22) 29/17 mmHg CO:  [4.4 L/min-7.2 L/min] 7.2 L/min CI:  [2 L/min/m2-3.3 L/min/m2] 3.3 L/min/m2  Physical Exam:  Rhythm:   sinus  Breath sounds: clear  Heart sounds:  RRR w/out murmur  Incisions:  Dressings dry, intact  Abdomen:  Soft, non distended  Extremities:  Warm, well perfused   Intake/Output from previous day: 03/25 0701 - 03/26 0700 In: 5347.1 [I.V.:3696.1; Blood:423; NG/GT:30; IV Piggyback:1198] Out: 4690 [Urine:3170; Emesis/NG output:50; Blood:800; Chest Tube:670] Intake/Output this shift: Total I/O In: 90 [I.V.:40; IV Piggyback:50] Out: 65 [Urine:25; Chest Tube:40]  Lab Results:  Recent Labs  09/27/12 2130 09/28/12 0500  WBC 10.3 11.2*  HGB 13.0  12.2* 11.6*  HCT 37.6*  36.0* 33.3*  PLT 102* 102*   BMET:  Recent Labs  09/27/12 2130 09/28/12 0500  NA 140 140  K 4.4 4.0  CL 111 109  CO2  --  24  GLUCOSE 139* 139*  BUN 12 16  CREATININE 0.96  0.90 1.03  CALCIUM  --  7.4*    CBG (last 3)   Recent Labs  09/27/12 2350 09/28/12 0340 09/28/12 0359  GLUCAP 180* 130* 128*   ABG    Component Value Date/Time   PHART 7.300* 09/27/2012 2249   HCO3 22.1 09/27/2012 2249   TCO2 23 09/27/2012 2249   ACIDBASEDEF 4.0* 09/27/2012 2249   O2SAT 95.0 09/27/2012 2249   CXR:   *RADIOLOGY REPORT*  Clinical Data: Minimally Mason mitral valve repair.  PORTABLE CHEST -  1 VIEW  Comparison: Chest x-ray 09/27/2012.  Findings: Previously noted nasogastric and endotracheal tubes have  been removed. Right-sided chest tube has been withdrawn slightly,  but remains properly located. There is a small lucency at the base  of the right hemithorax which could represent a tiny basilar right-  sided pneumothorax. Left-sided internal jugular central venous  Cordis in position through which a Swan Ganz catheter has been  passed into the right descending pulmonary artery branch.  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 The epicardial pacing wires remain in position. Lung  volumes are low with a linear opacities throughout the mid and  lower lungs bilaterally, most compatible with resolving  postoperative subsegmental atelectasis. Trace bilateral pleural  effusions. Pulmonary venous congestion, without frank pulmonary  edema. Heart size is mildly enlarged. The patient is rotated to  the left on today's exam, resulting in distortion of the  mediastinal contours and reduced diagnostic sensitivity and  specificity for mediastinal pathology. Atherosclerosis in the  thoracic aorta.  IMPRESSION:  1. Support apparatus, as above.  2. Probable trace right basilar pneumothorax. Attention on follow-  up studies is recommended. Right-sided chest tube appears properly  located.  3. Low lung volumes with bibasilar subsegmental atelectasis and  small bilateral pleural effusions.  Original Report Authenticated By: Trudie Reed, M.D.         Assessment/Plan: S/P Procedure(s) (LRB): MINIMALLY INVASIVE MITRAL  VALVE REPAIR (MVR) (Right) INTRAOPERATIVE TRANSESOPHAGEAL ECHOCARDIOGRAM (N/A)  Overall doing well POD1 Expected post op acute blood loss anemia, mild, stable Expected post op volume excess, mild Post op nausea Post op thrombocytopenia, mild   Mobilize  Diuresis  D/C lines  Check upright CXR + KUB r/o free air  IV  Zofran + phenergan as needed  Try IV reglan   [name],Christopher H 09/28/2012 8:27 AM

## 2012-09-28 NOTE — Progress Notes (Signed)
PT has refused CPAP again for tonight. PT says he does not feel like he needs it tonight. PT is currently on [place] with o2 sats 98%. RT advised PT to let us know if he changes his mind. RT will continue to assist as needed.

## 2012-09-29 ENCOUNTER — Inpatient Hospital Stay (HOSPITAL_COMMUNITY): Payer: BC Managed Care – PPO

## 2012-09-29 ENCOUNTER — Encounter (HOSPITAL_COMMUNITY): Payer: Self-pay | Admitting: Thoracic Surgery (Cardiothoracic Vascular Surgery)

## 2012-09-29 LAB — GLUCOSE, CAPILLARY
Glucose-Capillary: 112 mg/dL — ABNORMAL HIGH (ref 70–99)
Glucose-Capillary: 99 mg/dL (ref 70–99)
Glucose-Capillary: 107 mg/dL — ABNORMAL HIGH (ref 70–99)

## 2012-09-29 LAB — BASIC METABOLIC PANEL
BUN: 25 mg/dL — ABNORMAL HIGH (ref 6–23)
CO2: 28 mEq/L (ref 19–32)
Calcium: 7.6 mg/dL — ABNORMAL LOW (ref 8.4–10.5)
Chloride: 105 mEq/L (ref 96–112)
Creatinine, Ser: 1.11 mg/dL (ref 0.50–1.35)
GFR calc Af Amer: 80 mL/min — ABNORMAL LOW (ref >90)
GFR calc non Af Amer: 69 mL/min — ABNORMAL LOW (ref >90)
Glucose, Bld: 106 mg/dL — ABNORMAL HIGH (ref 70–99)
Potassium: 3.8 mEq/L (ref 3.5–5.1)
Sodium: 138 mEq/L (ref 135–145)

## 2012-09-29 LAB — CBC
HCT: 30.5 % — ABNORMAL LOW (ref 39.0–52.0)
Hemoglobin: 10.6 g/dL — ABNORMAL LOW (ref 13.0–17.0)
MCH: 29.9 pg (ref 26.0–34.0)
MCHC: 34.8 g/dL (ref 30.0–36.0)
MCV: 86.2 fL (ref 78.0–100.0)
Platelets: 90 10*3/uL — ABNORMAL LOW (ref 150–400)
RBC: 3.54 MIL/uL — ABNORMAL LOW (ref 4.22–5.81)
RDW: 14 % (ref 11.5–15.5)
WBC: 11.1 10*3/uL — ABNORMAL HIGH (ref 4.0–10.5)

## 2012-09-29 MED ORDER — POTASSIUM CHLORIDE 10 MEQ/50ML IV SOLN
10.0000 meq | INTRAVENOUS | Status: AC
  Administered 2012-09-29 (×3): 10 meq via INTRAVENOUS
  Filled 2012-09-29: qty 150

## 2012-09-29 MED ORDER — ATORVASTATIN CALCIUM 20 MG PO TABS
20.0000 mg | ORAL_TABLET | Freq: Every day | ORAL | Status: DC
  Administered 2012-09-29 – 2012-10-02 (×4): 20 mg via ORAL
  Filled 2012-09-29 (×5): qty 1

## 2012-09-29 MED ORDER — ASPIRIN EC 81 MG PO TBEC
81.0000 mg | DELAYED_RELEASE_TABLET | Freq: Every day | ORAL | Status: DC
  Administered 2012-09-29 – 2012-10-02 (×4): 81 mg via ORAL
  Filled 2012-09-29 (×5): qty 1

## 2012-09-29 MED ORDER — PATIENT'S GUIDE TO USING COUMADIN BOOK
Freq: Once | Status: AC
  Administered 2012-09-29: 18:00:00
  Filled 2012-09-29: qty 1

## 2012-09-29 MED ORDER — SODIUM CHLORIDE 0.9 % IV SOLN: 250.0000 mL | INTRAVENOUS | Status: DC | PRN

## 2012-09-29 MED ORDER — FLUTICASONE PROPIONATE 50 MCG/ACT NA SUSP
2.0000 | NASAL | Status: DC | PRN
  Filled 2012-09-29: qty 16

## 2012-09-29 MED ORDER — FUROSEMIDE 40 MG PO TABS
40.0000 mg | ORAL_TABLET | Freq: Two times a day (BID) | ORAL | Status: DC
  Administered 2012-09-30 – 2012-10-02 (×5): 40 mg via ORAL
  Filled 2012-09-29 (×6): qty 1

## 2012-09-29 MED ORDER — METOPROLOL TARTRATE 25 MG PO TABS
25.0000 mg | ORAL_TABLET | Freq: Two times a day (BID) | ORAL | Status: DC
  Filled 2012-09-29 (×2): qty 1

## 2012-09-29 MED ORDER — SODIUM CHLORIDE 0.9 % IJ SOLN: 3.0000 mL | INTRAMUSCULAR | Status: DC | PRN

## 2012-09-29 MED ORDER — FUROSEMIDE 10 MG/ML IJ SOLN
INTRAMUSCULAR | Status: AC
  Filled 2012-09-29: qty 4

## 2012-09-29 MED ORDER — WARFARIN - PHYSICIAN DOSING INPATIENT
Freq: Every day | Status: DC
  Administered 2012-09-30 – 2012-10-01 (×2)

## 2012-09-29 MED ORDER — WARFARIN SODIUM 5 MG PO TABS
5.0000 mg | ORAL_TABLET | Freq: Every day | ORAL | Status: DC
  Administered 2012-09-29 – 2012-09-30 (×2): 5 mg via ORAL
  Filled 2012-09-29 (×4): qty 1

## 2012-09-29 MED ORDER — FUROSEMIDE 10 MG/ML IJ SOLN
20.0000 mg | Freq: Four times a day (QID) | INTRAMUSCULAR | Status: AC
  Administered 2012-09-29 (×3): 20 mg via INTRAVENOUS
  Filled 2012-09-29 (×2): qty 2

## 2012-09-29 MED ORDER — TRAMADOL HCL 50 MG PO TABS: 50.0000 mg | ORAL_TABLET | ORAL | Status: DC | PRN

## 2012-09-29 MED ORDER — METOPROLOL TARTRATE 25 MG PO TABS
25.0000 mg | ORAL_TABLET | Freq: Two times a day (BID) | ORAL | Status: DC
  Administered 2012-09-29 – 2012-10-02 (×7): 25 mg via ORAL
  Filled 2012-09-29 (×8): qty 1

## 2012-09-29 MED ORDER — WARFARIN VIDEO: Freq: Once | Status: DC

## 2012-09-29 MED ORDER — LORAZEPAM 0.5 MG PO TABS: 0.5000 mg | ORAL_TABLET | Freq: Four times a day (QID) | ORAL | Status: DC | PRN

## 2012-09-29 MED ORDER — PANTOPRAZOLE SODIUM 40 MG PO TBEC
40.0000 mg | DELAYED_RELEASE_TABLET | Freq: Every day | ORAL | Status: DC
  Administered 2012-09-29 – 2012-10-01 (×3): 40 mg via ORAL
  Filled 2012-09-29 (×3): qty 1

## 2012-09-29 MED ORDER — POTASSIUM CHLORIDE CRYS ER 20 MEQ PO TBCR
20.0000 meq | EXTENDED_RELEASE_TABLET | Freq: Two times a day (BID) | ORAL | Status: DC
  Administered 2012-09-30 – 2012-10-02 (×5): 20 meq via ORAL
  Filled 2012-09-29 (×6): qty 1

## 2012-09-29 MED ORDER — SODIUM CHLORIDE 0.9 % IJ SOLN
3.0000 mL | Freq: Two times a day (BID) | INTRAMUSCULAR | Status: DC
  Administered 2012-09-29 – 2012-10-01 (×4): 3 mL via INTRAVENOUS

## 2012-09-29 MED ORDER — MOVING RIGHT ALONG BOOK
Freq: Once | Status: AC
  Administered 2012-09-29: 09:00:00
  Filled 2012-09-29: qty 1

## 2012-09-29 NOTE — Progress Notes (Signed)
Pt recently walked from 2300 (per pt) for second walk today and is exhausted now. Will f/u in am. Ethelda Chick CES, ACSM

## 2012-09-29 NOTE — Plan of Care (Signed)
Problem: Phase III Progression Outcomes Goal: Time patient transferred to PCTU/Telemetry POD Outcome: Completed/Met Date Met:  09/29/12 1255pm-pt ambulated to 2016, VS stable prior and during the ambulation. Pt settled in bed in 2016. Report was given by patient's primary RN to receiving RN Suzette Battiest. Family at bedside. No personal belongings at 2305 that could be taken up to patient's new room. Breland Elders, Charity fundraiser.

## 2012-09-29 NOTE — Progress Notes (Signed)
Patient assessed per flow sheet. Denies chest pain or shortness of breath. C/o nausea; will give zofran. Chest tube intact; noted @ 20cm suction; no air leak noted. Mamie Levers

## 2012-09-29 NOTE — Progress Notes (Signed)
Ambulated 100 ft using front wheel walker. Returned to room w/out incidence. Call bell near.Christopher [name]

## 2012-09-29 NOTE — Progress Notes (Signed)
PT has refused CPAP again for tonight. PT says he does not feel like our mask futs him and he doesn't have his own. PT is currently on room air SATs 93%%. RT advised PT to let us know if he changes his mind. RT will continue to assist as needed.

## 2012-09-29 NOTE — Progress Notes (Signed)
   CARDIOTHORACIC SURGERY PROGRESS NOTE   R2 Days Post-Op Procedure(s) (LRB): MINIMALLY INVASIVE MITRAL VALVE REPAIR (MVR) (Right) INTRAOPERATIVE TRANSESOPHAGEAL ECHOCARDIOGRAM (N/A)  Subjective: Looks good.  Feels much better.  Denies pain.  Nausea improved.  Tolerating liquids  Objective: Vital signs: BP Readings from Last 1 Encounters:  09/29/12 126/62   Pulse Readings from Last 1 Encounters:  09/29/12 79   Resp Readings from Last 1 Encounters:  09/29/12 18   Temp Readings from Last 1 Encounters:  09/29/12 97.8 F (36.6 C) Oral    Hemodynamics: PAP: (31-37)/(16-21) 32/16 mmHg  Physical Exam:  Rhythm:   sinus  Breath sounds: clear  Heart sounds:  RRR w/out murmur  Incisions:  Clean and dry  Abdomen:  Soft, non distended, non tender  Extremities:  Warm, well perfused   Intake/Output from previous day: 03/26 0701 - 03/27 0700 In: 1533 [P.O.:710; I.V.:473; IV Piggyback:350] Out: 2310 [Urine:1770; Chest Tube:540] Intake/Output this shift: Total I/O In: 20 [I.V.:20] Out: -   Lab Results:  Recent Labs  09/28/12 1700 09/28/12 1805 09/29/12 0425  WBC 13.4*  --  11.1*  HGB 11.3* 10.5* 10.6*  HCT 32.9* 31.0* 30.5*  PLT 93*  --  90*   BMET:  Recent Labs  09/28/12 0500  09/28/12 1805 09/29/12 0425  NA 140  --  140 138  K 4.0  --  3.7 3.8  CL 109  --  105 105  CO2 24  --   --  28  GLUCOSE 139*  --  120* 106*  BUN 16  --  22 25*  CREATININE 1.03  < > 1.00 1.11  CALCIUM 7.4*  --   --  7.6*  < > = values in this interval not displayed.  CBG (last 3)   Recent Labs  09/28/12 2336 09/29/12 0433 09/29/12 0743  GLUCAP 112* 99 107*   ABG    Component Value Date/Time   PHART 7.300* 09/27/2012 2249   HCO3 22.1 09/27/2012 2249   TCO2 29 09/28/2012 1805   ACIDBASEDEF 4.0* 09/27/2012 2249   O2SAT 95.0 09/27/2012 2249   CXR: *RADIOLOGY REPORT*  Clinical Data: Hypertension. Status post CABG. Nausea and  vomiting.  PORTABLE CHEST - 1 VIEW  Comparison:  09/28/2012 acute abdomen series  Findings: Right-sided chest tube remains in place. Epicardial  pacer wires. A left IJ Swan-Ganz catheter.  Cardiomegaly accentuated by AP portable technique. Small left and  developing small right pleural effusion. No apical pneumothorax.  There may be a small amount of pleural air along the right  hemidiaphragm.  No congestive failure.  Patchy bibasilar atelectasis which is not significantly changed.  Valve prosthesis.  IMPRESSION:  Right-sided chest tube remaining in place, without apical  pneumothorax. Cannot exclude small volume subpulmonic pleural air.  Suspect small bilateral pleural effusions with patchy bibasilar  atelectasis.  Original Report Authenticated By: Jeronimo Greaves, M.D.    Assessment/Plan: S/P Procedure(s) (LRB): MINIMALLY INVASIVE MITRAL VALVE REPAIR (MVR) (Right) INTRAOPERATIVE TRANSESOPHAGEAL ECHOCARDIOGRAM (N/A)  Doing well POD2 Expected post op acute blood loss anemia, mild, stable Expected post op volume excess, mild, diuresing Post op atelectasis, mild Post op nausea, improved Post op thrombocytopenia, mild, stable Hypokalemia, diuretic induced   Mobilize  Diuresis  Supplement K+  Leave chest tubes 1 more day  Coumadin  Transfer step down  OWEN,CLARENCE H 09/29/2012 8:25 AM

## 2012-09-30 ENCOUNTER — Inpatient Hospital Stay (HOSPITAL_COMMUNITY): Payer: BC Managed Care – PPO

## 2012-09-30 LAB — CBC
HCT: 33.4 % — ABNORMAL LOW (ref 39.0–52.0)
Hemoglobin: 10.8 g/dL — ABNORMAL LOW (ref 13.0–17.0)
MCH: 28.2 pg (ref 26.0–34.0)
MCHC: 32.3 g/dL (ref 30.0–36.0)
MCV: 87.2 fL (ref 78.0–100.0)
Platelets: 97 10*3/uL — ABNORMAL LOW (ref 150–400)
RBC: 3.83 MIL/uL — ABNORMAL LOW (ref 4.22–5.81)
RDW: 13.7 % (ref 11.5–15.5)
WBC: 10.2 10*3/uL (ref 4.0–10.5)

## 2012-09-30 LAB — BASIC METABOLIC PANEL
BUN: 21 mg/dL (ref 6–23)
CO2: 26 mEq/L (ref 19–32)
Calcium: 7.8 mg/dL — ABNORMAL LOW (ref 8.4–10.5)
Chloride: 102 mEq/L (ref 96–112)
Creatinine, Ser: 0.84 mg/dL (ref 0.50–1.35)
GFR calc Af Amer: >90 mL/min (ref >90)
GFR calc non Af Amer: >90 mL/min (ref >90)
Glucose, Bld: 106 mg/dL — ABNORMAL HIGH (ref 70–99)
Potassium: 3.7 mEq/L (ref 3.5–5.1)
Sodium: 136 mEq/L (ref 135–145)

## 2012-09-30 LAB — PROTIME-INR
INR: 1.13 (ref 0.00–1.49)
Prothrombin Time: 14.3 seconds (ref 11.6–15.2)

## 2012-09-30 MED ORDER — RAMIPRIL 10 MG PO CAPS
10.0000 mg | ORAL_CAPSULE | Freq: Every morning | ORAL | Status: DC
  Administered 2012-10-01 – 2012-10-02 (×2): 10 mg via ORAL
  Filled 2012-09-30 (×2): qty 1

## 2012-09-30 MED ORDER — WARFARIN VIDEO
Freq: Once | Status: AC
  Administered 2012-09-30: 18:00:00

## 2012-09-30 NOTE — Progress Notes (Signed)
EPW removed per protocol with ends intact. No bleeding or ectopy noted. Patient instructed to lay supine x 1 hr; verbalized understanding. Instructed to notify RN of any distress. Call bell near. Will continue to monitor. Mamie Levers

## 2012-09-30 NOTE — Evaluation (Signed)
Physical Therapy Evaluation Patient Details Name: Christopher PEREZPEREZ MRN: [id number] DOB: [birth year] Today's Date: 09/30/2012 Time: 4098-1191 PT Time Calculation (min): 32 min  PT Assessment / Plan / Recommendation Clinical Impression  Pt more unsteady with gait and stairs than he expected.  Higher level tasks were accomplished without LOB, but were unsteady.  Pt can benefit from PT to maximize I.    PT Assessment  Patient needs continued PT services    Follow Up Recommendations  Supervision for mobility/OOB;No PT follow up    Does the patient have the potential to tolerate intense rehabilitation      Barriers to Discharge        Equipment Recommendations  None recommended by PT    Recommendations for Other Services     Frequency Min 3X/week    Precautions / Restrictions Precautions Precautions: Fall Restrictions Weight Bearing Restrictions: No   Pertinent Vitals/Pain No pain really      Mobility  Bed Mobility Bed Mobility: Rolling Left;Left Sidelying to Sit Rolling Left: 5: Supervision Left Sidelying to Sit: 5: Supervision;HOB flat Details for Bed Mobility Assistance: Some struggle getting OOB, but cuing to come up via L elbow helped Transfers Transfers: Sit to Stand;Stand to Sit Sit to Stand: 4: Min guard;With upper extremity assist;From bed Stand to Sit: 4: Min guard;With upper extremity assist;To chair/3-in-1 Ambulation/Gait Ambulation/Gait Assistance: 4: Min guard Ambulation Distance (Feet): 550 Feet Assistive device: None Ambulation/Gait Assistance Details: mildly unsteady with min incr instability with scanning turns, cadence changes.  But with no overt LOB Gait Pattern: Step-through pattern;Wide base of support (mild scissoring, ) Gait velocity: slower Stairs: Yes Stairs Assistance: 4: Min guard Stair Management Technique: One rail Right;Alternating pattern;Forwards Number of Stairs: 3    Exercises     PT Diagnosis: Generalized weakness;Other (comment)  (decr activity tolerance, decr balance)  PT Problem List: Decreased strength;Decreased activity tolerance;Decreased balance;Decreased mobility PT Treatment Interventions: Gait training;Functional mobility training;Therapeutic activities;Balance training;Patient/family education   PT Goals Acute Rehab PT Goals PT Goal Formulation: With patient Time For Goal Achievement: 10/14/12 Potential to Achieve Goals: Good Pt will go Supine/Side to Sit: Independently;with HOB 0 degrees PT Goal: Supine/Side to Sit - Progress: Goal set today Pt will go Sit to Stand: Independently PT Goal: Sit to Stand - Progress: Goal set today Pt will Transfer Bed to Chair/Chair to Bed: Independently PT Transfer Goal: Bed to Chair/Chair to Bed - Progress: Goal set today Pt will Ambulate: >150 feet;with supervision PT Goal: Ambulate - Progress: Goal set today  Visit Information  Last PT Received On: 09/30/12 Assistance Needed: +1    Subjective Data  Subjective: My girlfriend is taking off to be with me for a week   Prior Functioning  Home Living Lives With: Alone Available Help at Discharge: Other (Comment) (girlfriend) Type of Home: House Home Access: Stairs to enter [employer] of Steps: 1 Home Layout: One level Bathroom Shower/Tub: Tub only;Walk-in shower Bathroom Toilet: Standard Home Adaptive Equipment: None Prior Function Level of Independence: Independent Able to Take Stairs?: Yes Driving: Yes Vocation: Full time employment Communication Communication: No difficulties    Cognition  Cognition Overall Cognitive Status: Appears within functional limits for tasks assessed/performed Arousal/Alertness: Awake/alert Orientation Level: Appears intact for tasks assessed Behavior During Session: [facility] for tasks performed    Extremity/Trunk Assessment Right Upper Extremity Assessment RUE ROM/Strength/Tone: [facility] for tasks assessed (weaker grips bil) Left Upper Extremity Assessment LUE  ROM/Strength/Tone: WFL for tasks assessed Right Lower Extremity Assessment RLE ROM/Strength/Tone: Within functional levels Left  Lower Extremity Assessment LLE ROM/Strength/Tone: Within functional levels (mildly weak bil) Trunk Assessment Trunk Assessment: Normal   Balance Balance Balance Assessed: Yes  End of Session PT - End of Session Activity Tolerance: Patient tolerated treatment well Patient left: in chair;with call bell/phone within reach Nurse Communication: Mobility status  GP     Marlaina Coburn, Eliseo Gum 09/30/2012, 4:29 PM 09/30/2012  [place], PT [phone] (pager)

## 2012-09-30 NOTE — Progress Notes (Signed)
CARDIAC REHAB PHASE I   PRE:  Rate/Rhythm: 80SR 1HB  BP:  Supine:   Sitting: 120/76  Standing:    SaO2: 96%RA  MODE:  Ambulation: 700 ft   POST:  Rate/Rhythm: 87SR 1HB  BP:  Supine: 130/70  Sitting:   Standing:    SaO2: 91-92%RA 7829-5621 Pt walked 700 ft with rolling walker and asst x 1. Stated felt good to walk. He kept stating it felt as if he was tied up and that was what was bothering him the most. Pt will feel more comfortable once chest tube and on Q removed. To bathroom and then to bed for removal of chest tube. Pt did not want walker as he states apartment too small. He has girlfriend who will be staying with him. He stated that she was not a nurse and he did not know if she could take care of him. When I told him that she just needed to help with meals,assist with walking and be there if he needs anything, he said she could do that. Discussed CRP 2 but pt not interested. Stated he would rather walk in park or use treadmill than have structured ex. Tolerated walk well.   Luetta Nutting, RN BSN  09/30/2012 9:24 AM

## 2012-09-30 NOTE — Care Management Note (Unsigned)
    Page 1 of 1   09/30/2012     2:42:52 PM   CARE MANAGEMENT NOTE 09/30/2012  Patient:  Christopher [name], Christopher [name]   Account Number:  [id number]  Date Initiated:  09/30/2012  Documentation initiated by:  Jariyah Hackley  Subjective/Objective Assessment:   PT S/P MINI MVR ON 09/27/12. PTA, PT INDEPENDENT, LIVES WITH GIRLFRIEND AND HAS 2 DAUGHTERS TO ASSIST.     Action/Plan:   WILL FOLLOW FOR HOME NEEDS AS PT PROGRESSES.   Anticipated DC Date:  10/01/2012   Anticipated DC Plan:  HOME W HOME HEALTH SERVICES      DC Planning Services  CM consult      Choice offered to / List presented to:             Status of service:  In process, will continue to follow Medicare Important Message given?   (If response is "NO", the following Medicare IM given date fields will be blank) Date Medicare IM given:   Date Additional Medicare IM given:    Discharge Disposition:    Per UR Regulation:  Reviewed for med. necessity/level of care/duration of stay  If discussed at Long Length of Stay Meetings, dates discussed:    Comments:

## 2012-09-30 NOTE — Progress Notes (Signed)
Moderate seroussanguneous drainage noted to Old CT dressing site. Site changed with vaseline gauze. Ambulated patient using front wheel walker to room 2029 for transfer. Assisted to bed. Denies distress. Call bell and family near. Report given to [facility], Charity fundraiser.Christopher [name]

## 2012-09-30 NOTE — Progress Notes (Addendum)
3 Days Post-Op Procedure(s) (LRB): MINIMALLY INVASIVE MITRAL VALVE REPAIR (MVR) (Right) INTRAOPERATIVE TRANSESOPHAGEAL ECHOCARDIOGRAM (N/A) Subjective:  Christopher [name] states he is doing somewhat better.  He states that he is unable to get up and down out of bed or chair by himself.  I assisted patient on how to get up and he is unsteady on his feet.  No BM  Objective: Vital signs in last 24 hours: Temp:  [98.1 F (36.7 C)-100 F (37.8 C)] 100 F (37.8 C) (03/28 0407) Pulse Rate:  [79-88] 82 (03/28 0407) Cardiac Rhythm:  [-] Heart block;Normal sinus rhythm (03/27 2029) Resp:  [18-26] 22 (03/28 0407) BP: (100-137)/(59-74) 137/71 mmHg (03/28 0407) SpO2:  [91 %-93 %] 93 % (03/28 0407) Weight:  [221 lb 12.5 oz (100.6 kg)] 221 lb 12.5 oz (100.6 kg) (03/28 0528)  Intake/Output from previous day: 03/27 0701 - 03/28 0700 In: 530 [P.O.:360; I.V.:20; IV Piggyback:150] Out: 2300 [Urine:2150; Chest Tube:150]  General appearance: alert, cooperative and no distress Heart: regular rate and rhythm Lungs: clear to auscultation bilaterally Abdomen: soft, non-tender; bowel sounds normal; no masses,  no organomegaly Extremities: edema trace Wound: clean and dry  Lab Results:  Recent Labs  09/28/12 1700 09/28/12 1805 09/29/12 0425  WBC 13.4*  --  11.1*  HGB 11.3* 10.5* 10.6*  HCT 32.9* 31.0* 30.5*  PLT 93*  --  90*   BMET:  Recent Labs  09/28/12 0500  09/28/12 1805 09/29/12 0425  NA 140  --  140 138  K 4.0  --  3.7 3.8  CL 109  --  105 105  CO2 24  --   --  28  GLUCOSE 139*  --  120* 106*  BUN 16  --  22 25*  CREATININE 1.03  < > 1.00 1.11  CALCIUM 7.4*  --   --  7.6*  < > = values in this interval not displayed.  PT/INR:  Recent Labs  09/27/12 1559  LABPROT 15.6*  INR 1.27   ABG    Component Value Date/Time   PHART 7.300* 09/27/2012 2249   HCO3 22.1 09/27/2012 2249   TCO2 29 09/28/2012 1805   ACIDBASEDEF 4.0* 09/27/2012 2249   O2SAT 95.0 09/27/2012 2249   CBG (last 3)    Recent Labs  09/28/12 2336 09/29/12 0433 09/29/12 0743  GLUCAP 112* 99 107*    Assessment/Plan: S/P Procedure(s) (LRB): MINIMALLY INVASIVE MITRAL VALVE REPAIR (MVR) (Right) INTRAOPERATIVE TRANSESOPHAGEAL ECHOCARDIOGRAM (N/A)  1. CV- NSR- on Lopressor, Mild Hypotension this morning, will restart home Norvasc 2. Pulm- off oxygen, continue IS 3. Renal- creatinine has been at baseline, volume status is stable- remains on Lasix 4. Chest tube- 150 cc output yesterday, will remove today 5. INR- in process this morning, will repeat 5 mg of Coumadin tonight 6. Deconditioning- patient is unsteady on gate, is unsure if girlfriend will be able to provide good care, will get PT consult 7. Dispo- patient is stable, will d/c EPW today, repeat Coumadin, likely d/c home this weekend unless SNF placement is indicated   LOS: 3 days    BARRETT, ERIN 09/30/2012  I have seen and examined the patient and agree with the assessment and plan as outlined.  Restart ACE-I instead of Norvasc.  He is feeling better this afternoon and has family/friends available to be with him when he goes home.  Possibly ready for d/c home in 2-3 days if he continues to progress.   [name] 09/30/2012 4:36 PM

## 2012-10-01 ENCOUNTER — Inpatient Hospital Stay (HOSPITAL_COMMUNITY): Payer: BC Managed Care – PPO

## 2012-10-01 LAB — PROTIME-INR
INR: 1.15 (ref 0.00–1.49)
Prothrombin Time: 14.5 seconds (ref 11.6–15.2)

## 2012-10-01 MED ORDER — WARFARIN SODIUM 7.5 MG PO TABS
7.5000 mg | ORAL_TABLET | Freq: Once | ORAL | Status: AC
  Administered 2012-10-01: 7.5 mg via ORAL
  Filled 2012-10-01: qty 1

## 2012-10-01 NOTE — Progress Notes (Signed)
CARDIAC REHAB PHASE I   PRE:  Rate/Rhythm: 83 first deg    BP: sitting 136/80    SaO2: 93 RA  MODE:  Ambulation: 890 ft   POST:  Rate/Rhythm: 87 first deg    BP: sitting 140/80    SaO2: 93 RA  Tolerated well with RW. Would like to order RW for home. Ed completed. Set up d/c video. Not interested in cRPII. [phone]   Elissa Lovett [place] CES, ACSM 10/01/2012 8:05 AM

## 2012-10-01 NOTE — Progress Notes (Signed)
Pt ambulated with family and RW on RA approx. 1200 ft, tolerated well, steady gait and pace, back to room with call bell in reach.  Will continue to monitor and encourage. Ave Filter

## 2012-10-01 NOTE — Progress Notes (Signed)
Pt ambulated in hall with wife about 1200 ft. Tolerated well.

## 2012-10-01 NOTE — Discharge Summary (Signed)
[address]            [place] [address]          [phone]         Discharge Summary  Name: Christopher [name] DOB: 15-Mar-1951 62 y.o. MRN: [id number]   Admission Date: 09/27/2012 Discharge Date: 10/02/2012    Admitting Diagnosis: Severe mitral regurgitation   Discharge Diagnosis:  Severe mitral regurgitation Expected postoperative blood loss anemia Postoperative thrombocytopenia   Past Medical History  Diagnosis Date  . Hypertension   . Endocarditis 2009    Strep viridans  . Rhinitis, allergic   . Severe mitral regurgitation     a. Mitral valve prolapse with flail segment of posterior leaflet and severe MR by TEE, remote h/o bacterial endocarditis   . Coronary artery disease     a. 2/7 Cath: LM nl, LAD min irregs, LCX min irregs, RI 40, RCA 103m, EF 55-60% basal to mid inf HK, 3-4+ MR;  b. 08/25/2012 PCI of RCA with 4.0x15 Vision BMS  . 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 Sleep apnea     NPSG 01/21/06- AHI 40.7/hr cpap  . Heart murmur   . GERD (gastroesophageal reflux disease)     hx  . Arthritis   . 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 male with long history of mitral valve prolapse and mitral regurgitation. The patient states that he was first told he had a heart murmur at least 15 years ago. 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 Blood cultures grew strep viridans and transesophageal echocardiogram at the time confirmed what appeared to be vegetation on the mitral valve with moderate to  severe mitral regurgitation. He has been followed carefully ever since in the [facility] cardiology office and recently returned to see Dr. [name] complaining of recent onset exertional shortness of breath. He underwent transesophageal echocardiogram and left and right heart catheterization by Dr. [name] which confirmed the presence of mitral valve prolapse with flail segment of the posterior leaflet and severe mitral regurgitation. Left ventricular function appeared reasonably well preserved with ejection fraction estimated 55-60%. Cardiac catheterization was notable for single-vessel coronary artery disease with high-grade 95% stenosis of the mid right coronary artery. He was seen in consultation on 08/22/2012 by Dr. Cornelius Moras for cardiac surgery consideration. Since then, he underwent PCI and stenting of the right coronary artery by Dr. [name] on 08/25/2012. He has done quite well since then and he returned to the office recently hoping to proceed with elective mitral valve repair in the near future. All risks, benefits and alternatives of surgery were explained in detail, and the patient agreed to proceed.     Hospital Course:  The patient was admitted to [facility] on 09/27/2012.  The patient was taken to the operating room and underwent the above procedure.    The postoperative course has generally been uneventful.  He initially had some nausea, but this resolved with conservative management.  He has been started  on a beta blocker and an ACE-inhibitor as his blood pressures have tolerated.  His chest tubes have been removed in the standard fashion, and follow up x-rays have remained stable.  He has worked with PT and cardiac rehab on mobility, and is walking well independently.  He has remained in sinus rhythm postoperatively.  He is tolerating a regular diet. He has been started on Coumadin, and his INR has been slow to trend upward and has required increased doses of Coumadin.  His INR on today's date is 1.2, and  he was given 7.5 mg of Coumadin today.  He is otherwise doing well, and it is felt that he may be discharged home and have his INR followed as an outpatient.    Recent vital signs:  Filed Vitals:   10/01/12 0552  BP: 127/76  Pulse: 80  Temp: 98.9 F (37.2 C)  Resp: 18    Recent laboratory studies:  CBC: Recent Labs  09/29/12 0425 09/30/12 0630  WBC 11.1* 10.2  HGB 10.6* 10.8*  HCT 30.5* 33.4*  PLT 90* 97*   BMET:  Recent Labs  09/29/12 0425 09/30/12 0630  NA 138 136  K 3.8 3.7  CL 105 102  CO2 28 26  GLUCOSE 106* 106*  BUN 25* 21  CREATININE 1.11 0.84  CALCIUM 7.6* 7.8*    PT/INR:  Lab Results  Component Value Date   INR 1.23 10/02/2012   INR 1.15 10/01/2012   INR 1.13 09/30/2012     Discharge Medications:     Medication List    STOP taking these medications       amiodarone 200 MG tablet  Commonly known as:  PACERONE     amLODipine 5 MG tablet  Commonly known as:  NORVASC     clopidogrel 75 MG tablet  Commonly known as:  PLAVIX     nitroGLYCERIN 0.4 MG SL tablet  Commonly known as:  NITROSTAT      TAKE these medications       aspirin EC 81 MG tablet  Take 81 mg by mouth daily.     atorvastatin 20 MG tablet  Commonly known as:  LIPITOR  Take 1 tablet (20 mg total) by mouth daily.     cetirizine 10 MG tablet  Commonly known as:  ZYRTEC  Take 10 mg by mouth daily.     fluticasone 50 MCG/ACT nasal spray  Commonly known as:  FLONASE  Place 2 sprays into the nose daily.     furosemide 40 MG tablet  Commonly known as:  LASIX  Take 1 tablet (40 mg total) by mouth daily. X 1 week     metoprolol tartrate 25 MG tablet  Commonly known as:  LOPRESSOR  Take 1 tablet (25 mg total) by mouth 2 (two) times daily.     potassium chloride SA 20 MEQ tablet  Commonly known as:  K-DUR,KLOR-CON  Take 1 tablet (20 mEq total) by mouth daily. X 1 week     ramipril 10 MG capsule  Commonly known as:  ALTACE  Take 10 mg by mouth every morning.      traMADol 50 MG tablet  Commonly known as:  ULTRAM  Take 1-2 tablets (50-100 mg total) by mouth every 4 (four) hours as needed for pain.     Vitamin D 2000 UNITS Caps  Take 1 capsule by mouth daily.     warfarin 5 MG tablet  Commonly known as:  COUMADIN  Take 1 tablet daily or  as directed by Coumadin Clinic         Discharge Instructions:  The patient is to refrain from driving, heavy lifting or strenuous activity.  May shower daily and clean incisions with soap and water.  May resume regular diet.   Follow Up:       Future Appointments Provider Department Dept Phone   11/03/2012 7:30 AM [facility] Main Office [place]) [phone]   12/27/2012 4:30 PM [name], MD [facility] Main Office [place]) [phone]      Follow-up Information   Follow up with [name], MD In 1 week. (Office will contact you with an appointment)    Contact information:   [address] [place] [address] [phone]       Follow up with [name], MD. Schedule an appointment as soon as possible for a visit in 2 weeks.   Contact information:   [address] [place] [place] [address] [phone]       Follow up with [facility] On 10/04/2012. (Have bloodwork for Coumadin (PT/INR) drawn at [facility] )    Contact information:   [address], [address] [place] [address] [phone]      Adella Hare 10/01/2012, 10:01 AM

## 2012-10-01 NOTE — Progress Notes (Addendum)
                    [address]            [place] [address]          [phone]     4 Days Post-Op Procedure(s) (LRB): MINIMALLY INVASIVE MITRAL VALVE REPAIR (MVR) (Right) INTRAOPERATIVE TRANSESOPHAGEAL ECHOCARDIOGRAM (N/A)  Subjective: Feels ok today.  Walked with CRPI and did well.  No complaints.   Objective: Vital signs in last 24 hours: Patient Vitals for the past 24 hrs:  BP Temp Temp src Pulse Resp SpO2 Weight  10/01/12 0552 127/76 mmHg 98.9 F (37.2 C) Oral 80 18 95 % 221 lb 1.9 oz (100.3 kg)  09/30/12 2020 125/67 mmHg 98.4 F (36.9 C) Oral 77 19 94 % -  09/30/12 1415 130/68 mmHg 98.5 F (36.9 C) Oral 81 18 93 % -  09/30/12 1045 122/70 mmHg - - 81 - - -   Current Weight  10/01/12 221 lb 1.9 oz (100.3 kg)     Intake/Output from previous day: 03/28 0701 - 03/29 0700 In: 720 [P.O.:720] Out: -     PHYSICAL EXAM:  Heart: RRR Lungs: Clear Wound: Clean and dry Extremities: Trace LE edema    Lab Results: CBC: Recent Labs  09/29/12 0425 09/30/12 0630  WBC 11.1* 10.2  HGB 10.6* 10.8*  HCT 30.5* 33.4*  PLT 90* 97*   BMET:  Recent Labs  09/29/12 0425 09/30/12 0630  NA 138 136  K 3.8 3.7  CL 105 102  CO2 28 26  GLUCOSE 106* 106*  BUN 25* 21  CREATININE 1.11 0.84  CALCIUM 7.6* 7.8*    PT/INR:  Recent Labs  10/01/12 0500  LABPROT 14.5  INR 1.15   ION:[id number]:  1. Stable right basilar atelectasis.  2. Mildly improved mild left lower lobe atelectasis.  3. Mild increase in size of a small right pleural effusion and  decrease in size of a small left pleural effusion.      Assessment/Plan: S/P Procedure(s) (LRB): MINIMALLY INVASIVE MITRAL VALVE REPAIR (MVR) (Right) INTRAOPERATIVE TRANSESOPHAGEAL ECHOCARDIOGRAM (N/A) CV- SR, BPs stable on current meds. Vol overload- diurese. INR actually dropped today.  Will give 7.5 mg Coumadin tonight and watch. Deconditioning- Continue CRPI.  He did well with walking this am.   Will order RW for home. Possibly ready for d/c in 1-2 days if remains stable.   LOS: 4 days    COLLINS,GINA H 10/01/2012  I have seen and examined Christopher [name] and agree with the above assessment  and plan.  Delight Ovens MD Beeper [phone] Office [phone] 10/01/2012 11:30 AM

## 2012-10-02 LAB — PROTIME-INR
INR: 1.23 (ref 0.00–1.49)
Prothrombin Time: 15.3 seconds — ABNORMAL HIGH (ref 11.6–15.2)

## 2012-10-02 MED ORDER — FUROSEMIDE 40 MG PO TABS: 40.0000 mg | ORAL_TABLET | Freq: Every day | ORAL | Status: DC

## 2012-10-02 MED ORDER — TRAMADOL HCL 50 MG PO TABS: 50.0000 mg | ORAL_TABLET | ORAL | Status: DC | PRN

## 2012-10-02 MED ORDER — WARFARIN SODIUM 7.5 MG PO TABS
7.5000 mg | ORAL_TABLET | ORAL | Status: DC
  Filled 2012-10-02 (×2): qty 1

## 2012-10-02 MED ORDER — WARFARIN SODIUM 5 MG PO TABS: ORAL_TABLET | ORAL | Status: DC

## 2012-10-02 MED ORDER — POTASSIUM CHLORIDE CRYS ER 20 MEQ PO TBCR: 20.0000 meq | EXTENDED_RELEASE_TABLET | Freq: Every day | ORAL | Status: DC

## 2012-10-02 NOTE — Progress Notes (Addendum)
                    [address]            [name] [address]          [phone]     5 Days Post-Op Procedure(s) (LRB): MINIMALLY INVASIVE MITRAL VALVE REPAIR (MVR) (Right) INTRAOPERATIVE TRANSESOPHAGEAL ECHOCARDIOGRAM (N/A)  Subjective: Feels well, wants to go home.  Walked 3 x yesterday.  Objective: Vital signs in last 24 hours: Patient Vitals for the past 24 hrs:  BP Temp Temp src Pulse Resp SpO2 Weight  10/02/12 0504 128/65 mmHg 98.3 F (36.8 C) Oral 71 17 95 % 217 lb 2.5 oz (98.5 kg)  10/01/12 2033 110/85 mmHg 98.6 F (37 C) Oral 95 18 98 % -  10/01/12 1405 129/66 mmHg 97.9 F (36.6 C) Oral 74 16 96 % -   Current Weight  10/02/12 217 lb 2.5 oz (98.5 kg)     Intake/Output from previous day: 03/29 0701 - 03/30 0700 In: 360 [P.O.:360] Out: 2 [Urine:1; Stool:1]    PHYSICAL EXAM:  Heart: RRR Lungs: Clear Wound:Clean and dry Extremities: No significant LE edema    Lab Results: CBC: Recent Labs  09/30/12 0630  WBC 10.2  HGB 10.8*  HCT 33.4*  PLT 97*   BMET:  Recent Labs  09/30/12 0630  NA 136  K 3.7  CL 102  CO2 26  GLUCOSE 106*  BUN 21  CREATININE 0.84  CALCIUM 7.8*    PT/INR:  Recent Labs  10/02/12 0715  LABPROT 15.3*  INR 1.23      Assessment/Plan: S/P Procedure(s) (LRB): MINIMALLY INVASIVE MITRAL VALVE REPAIR (MVR) (Right) INTRAOPERATIVE TRANSESOPHAGEAL ECHOCARDIOGRAM (N/A) Doing well overall, but INR still is not therapeutic.   Will discuss with MD.  May need to keep in hospital 1 more day to make sure he is trending up. Continue current care.   LOS: 5 days    COLLINS,GINA H 10/02/2012  ADDENDUM:  Per Dr. Tyrone Sage, the patient may go home today and f/u as OP with [facility].  We will give him 7.5 mg prior to d/c.  Plan early fu in coumadin clinic Tuesday, 7.5 coumadin today and 5 mg tomorrow I have seen and examined Debarah Crape and agree with the above assessment  and plan.  Delight Ovens MD Beeper [phone] Office [phone] 10/02/2012 11:33 AM

## 2012-10-02 NOTE — Progress Notes (Signed)
No NCM needs identified. Isidoro Donning RN CCM Case Mgmt phone [phone]

## 2012-10-02 NOTE — Progress Notes (Signed)
CT site dressing changed twice during the night. 4x4's completely saturated with serosanguinous drainage both times. CT sites cleaned with betadine and redressed.

## 2012-10-03 ENCOUNTER — Ambulatory Visit: Payer: BC Managed Care – PPO | Admitting: Thoracic Surgery (Cardiothoracic Vascular Surgery)

## 2012-10-04 ENCOUNTER — Ambulatory Visit (INDEPENDENT_AMBULATORY_CARE_PROVIDER_SITE_OTHER): Payer: BC Managed Care – PPO | Admitting: *Deleted

## 2012-10-04 DIAGNOSIS — Z7901 Long term (current) use of anticoagulants: Secondary | ICD-10-CM

## 2012-10-04 DIAGNOSIS — I340 Nonrheumatic mitral (valve) insufficiency: Secondary | ICD-10-CM

## 2012-10-04 DIAGNOSIS — Z9889 Other specified postprocedural states: Secondary | ICD-10-CM

## 2012-10-04 DIAGNOSIS — I059 Rheumatic mitral valve disease, unspecified: Secondary | ICD-10-CM

## 2012-10-04 LAB — POCT INR: INR: 1.8

## 2012-10-04 NOTE — Patient Instructions (Signed)

## 2012-10-05 ENCOUNTER — Telehealth: Payer: Self-pay | Admitting: Cardiology

## 2012-10-05 NOTE — Telephone Encounter (Signed)
Spoke with Christopher [name]. She is aware Dr [name] recommends antibiotics (amoixicillin 2G 30-60 minutes before procedure if not allergic) prior to dental procedures for bacterial endocarditis prophylaxis.

## 2012-10-05 NOTE — Telephone Encounter (Signed)
New Problem:    Called in wanting to know if the patient needed to premed prior to any dental procedures.  Patient is scheduled to have a teeth cleaning on 10/11/12.  Please call back.

## 2012-10-06 ENCOUNTER — Other Ambulatory Visit: Payer: Self-pay | Admitting: *Deleted

## 2012-10-06 DIAGNOSIS — I059 Rheumatic mitral valve disease, unspecified: Secondary | ICD-10-CM

## 2012-10-10 ENCOUNTER — Ambulatory Visit (INDEPENDENT_AMBULATORY_CARE_PROVIDER_SITE_OTHER): Payer: Self-pay | Admitting: Thoracic Surgery (Cardiothoracic Vascular Surgery)

## 2012-10-10 ENCOUNTER — Encounter: Payer: Self-pay | Admitting: Thoracic Surgery (Cardiothoracic Vascular Surgery)

## 2012-10-10 ENCOUNTER — Ambulatory Visit
Admission: RE | Admit: 2012-10-10 | Discharge: 2012-10-10 | Disposition: A | Discharge status: Home / Self Care | Payer: BC Managed Care – PPO | Source: Ambulatory Visit | Attending: Thoracic Surgery (Cardiothoracic Vascular Surgery) | Admitting: Thoracic Surgery (Cardiothoracic Vascular Surgery)

## 2012-10-10 VITALS — BP 121/78 | HR 85 | Resp 20 | Ht 70.0 in | Wt 218.0 lb

## 2012-10-10 DIAGNOSIS — I059 Rheumatic mitral valve disease, unspecified: Secondary | ICD-10-CM

## 2012-10-10 DIAGNOSIS — Z9889 Other specified postprocedural states: Secondary | ICD-10-CM

## 2012-10-10 DIAGNOSIS — Z09 Encounter for follow-up examination after completed treatment for conditions other than malignant neoplasm: Secondary | ICD-10-CM

## 2012-10-10 MED ORDER — ASPIRIN EC 81 MG PO TBEC: 81.0000 mg | DELAYED_RELEASE_TABLET | Freq: Every day | ORAL | Status: DC

## 2012-10-10 NOTE — Addendum Note (Signed)
Addended by: Tressie Stalker MD H on: 10/10/2012 10:16 AM   Modules accepted: Orders

## 2012-10-10 NOTE — Patient Instructions (Signed)
The patient may continue to gradually increase their physical activity as tolerated.  They should refrain from any heavy lifting or strenuous use of their arms and shoulders until at least 8 weeks from the time of their surgery, and they should avoid activities that cause increased pain in their chest on the side of their surgical incision.  Otherwise they may continue to increase their activities without any particular limitations.  The patient may return to driving an automobile as long as they are no longer requiring oral narcotic pain relievers during the daytime.  It would be wise to start driving only short distances during the daylight and gradually increase from there as they feel comfortable.  

## 2012-10-10 NOTE — Progress Notes (Addendum)
[address]            [name] [address]          [phone]     CARDIOTHORACIC SURGERY OFFICE NOTE  Referring Provider is [name], MD PCP is Pcp Not In System   HPI:  Patient returns for routine followup status post minimally invasive mitral valve repair on 09/27/2012. His postoperative cover he has been uncomplicated. Since hospital discharge she has continued to do quite well. He reports that he has only mild soreness in his chest and he never filled the prescription for oral narcotic pain relievers. His appetite is good. He's sleeping reasonably well at night, as well as he did before surgery.  He describes mild exertional shortness of breath he continues to gradually improve. He has been walking every day. Overall he has no complaints and he is eager to increase his activity. He is asked about playing golf.  He did have some serous drainage from his chest tube incisions last week but this resolved spontaneously.   Current Outpatient Prescriptions  Medication Sig Dispense Refill  . acetaminophen (TYLENOL) 325 MG tablet Take 650 mg by mouth every 6 (six) hours as needed for pain.      Marland Kitchen aspirin EC 81 MG tablet Take 81 mg by mouth daily.      Marland Kitchen atorvastatin (LIPITOR) 20 MG tablet Take 1 tablet (20 mg total) by mouth daily.  90 tablet  3  . cetirizine (ZYRTEC) 10 MG tablet Take 10 mg by mouth daily.        . Cholecalciferol (VITAMIN D) 2000 UNITS CAPS Take 1 capsule by mouth daily.      . fluticasone (FLONASE) 50 MCG/ACT nasal spray Place 2 sprays into the nose daily.  48 g  11  . metoprolol tartrate (LOPRESSOR) 25 MG tablet Take 1 tablet (25 mg total) by mouth 2 (two) times daily.  180 tablet  3  . ramipril (ALTACE) 10 MG capsule Take 10 mg by mouth every morning.      . warfarin (COUMADIN) 5 MG tablet Take 1 tablet daily or as directed by Coumadin Clinic  60 tablet  1   No current facility-administered medications for this visit.       Physical Exam:   BP 121/78  Pulse 85  Resp 20  Ht 5\' 10"  (1.778 m)  Wt 218 lb (98.884 kg)  BMI 31.28 kg/m2  SpO2 97%  General:  Well-appearing  Chest:   Clear to auscultation with symmetrical breath sounds  CV:   Regular rate and rhythm without murmur  Incisions:  Clean and dry and healing nicely  Abdomen:  Soft nontender  Extremities:  Warm and well-perfused  Diagnostic Tests:  *RADIOLOGY REPORT*  Clinical Data: Shortness of breath, status post heart surgery  CHEST - 2 VIEW  Comparison: 10/01/2012  Findings: There are persistent changes in the right lung base consistent atelectasis and small effusion. They have improved slightly in the interval from prior exam. The left lung is clear. Postsurgical changes are again noted.  IMPRESSION: Persistent but slightly improved changes within the right lung base.   Original Report Authenticated By: Alcide Clever, M.D.     Impression:  Patient is doing very well less than 2 weeks following minimally invasive mitral valve repair.  Plan:  I've encouraged patient to continue to increase his physical activity as tolerated. Have  reminded him to avoid any sort of heavy lifting or strenuous use of his arms or shoulders for at least 8 weeks from the time of surgery. I think he can resume driving an automobile. I've encouraged the patient to enroll in the cardiac rehabilitation program but he does not seem to be interested in this. He promises that he will make an effort to walk everyday. We have talked about going back to playing golf and all of his questions been addressed. I've instructed him to stop taking aspirin while he is therapeutic on Coumadin. When he comes off of Coumadin in 2 months he should resume taking aspirin.   Christopher Decent. Cornelius Moras, MD 10/10/2012 9:58 AM     Correction:  I have instructed Christopher [name] to continue taking both aspirin and coumadin because of his recent coronary artery stent.  Christopher [name],Christopher [name] 10/10/2012 10:14 AM

## 2012-10-11 ENCOUNTER — Ambulatory Visit (INDEPENDENT_AMBULATORY_CARE_PROVIDER_SITE_OTHER): Payer: BC Managed Care – PPO | Admitting: *Deleted

## 2012-10-11 DIAGNOSIS — I340 Nonrheumatic mitral (valve) insufficiency: Secondary | ICD-10-CM

## 2012-10-11 DIAGNOSIS — Z7901 Long term (current) use of anticoagulants: Secondary | ICD-10-CM

## 2012-10-11 DIAGNOSIS — Z9889 Other specified postprocedural states: Secondary | ICD-10-CM

## 2012-10-11 DIAGNOSIS — I059 Rheumatic mitral valve disease, unspecified: Secondary | ICD-10-CM

## 2012-10-11 LAB — POCT INR: INR: 1.7

## 2012-10-21 ENCOUNTER — Ambulatory Visit (INDEPENDENT_AMBULATORY_CARE_PROVIDER_SITE_OTHER): Payer: BC Managed Care – PPO | Admitting: Physician Assistant

## 2012-10-21 ENCOUNTER — Ambulatory Visit (HOSPITAL_COMMUNITY)
Admission: RE | Admit: 2012-10-21 | Discharge: 2012-10-21 | Disposition: A | Discharge status: Home / Self Care | Payer: BC Managed Care – PPO | Source: Ambulatory Visit | Attending: Physician Assistant | Admitting: Physician Assistant

## 2012-10-21 ENCOUNTER — Encounter: Payer: Self-pay | Admitting: Physician Assistant

## 2012-10-21 ENCOUNTER — Ambulatory Visit (INDEPENDENT_AMBULATORY_CARE_PROVIDER_SITE_OTHER): Payer: BC Managed Care – PPO | Admitting: Pharmacist

## 2012-10-21 ENCOUNTER — Telehealth: Payer: Self-pay | Admitting: *Deleted

## 2012-10-21 VITALS — BP 138/90 | HR 82 | Ht 70.0 in | Wt 218.0 lb

## 2012-10-21 DIAGNOSIS — Z7901 Long term (current) use of anticoagulants: Secondary | ICD-10-CM

## 2012-10-21 DIAGNOSIS — I059 Rheumatic mitral valve disease, unspecified: Secondary | ICD-10-CM

## 2012-10-21 DIAGNOSIS — I340 Nonrheumatic mitral (valve) insufficiency: Secondary | ICD-10-CM

## 2012-10-21 DIAGNOSIS — J90 Pleural effusion, not elsewhere classified: Secondary | ICD-10-CM | POA: Insufficient documentation

## 2012-10-21 DIAGNOSIS — I517 Cardiomegaly: Secondary | ICD-10-CM | POA: Insufficient documentation

## 2012-10-21 DIAGNOSIS — R059 Cough, unspecified: Secondary | ICD-10-CM

## 2012-10-21 DIAGNOSIS — Z954 Presence of other heart-valve replacement: Secondary | ICD-10-CM | POA: Insufficient documentation

## 2012-10-21 DIAGNOSIS — R05 Cough: Secondary | ICD-10-CM

## 2012-10-21 DIAGNOSIS — I10 Essential (primary) hypertension: Secondary | ICD-10-CM | POA: Insufficient documentation

## 2012-10-21 DIAGNOSIS — I2510 Atherosclerotic heart disease of native coronary artery without angina pectoris: Secondary | ICD-10-CM

## 2012-10-21 DIAGNOSIS — R0602 Shortness of breath: Secondary | ICD-10-CM | POA: Insufficient documentation

## 2012-10-21 DIAGNOSIS — Z9889 Other specified postprocedural states: Secondary | ICD-10-CM

## 2012-10-21 LAB — POCT INR: INR: 2.4

## 2012-10-21 MED ORDER — FUROSEMIDE 40 MG PO TABS: 40.0000 mg | ORAL_TABLET | Freq: Every day | ORAL | Status: DC

## 2012-10-21 MED ORDER — BENZONATATE 100 MG PO CAPS: 100.0000 mg | ORAL_CAPSULE | Freq: Three times a day (TID) | ORAL | Status: DC | PRN

## 2012-10-21 MED ORDER — FAMOTIDINE 20 MG PO TABS: 20.0000 mg | ORAL_TABLET | Freq: Two times a day (BID) | ORAL | Status: DC

## 2012-10-21 MED ORDER — POTASSIUM CHLORIDE CRYS ER 20 MEQ PO TBCR: 20.0000 meq | EXTENDED_RELEASE_TABLET | Freq: Every day | ORAL | Status: DC

## 2012-10-21 NOTE — Progress Notes (Signed)
[address], [address] [place], [place]  [address] Phone: [phone] Fax:  [phone]  Date:  10/21/2012   ID:  Christopher [name], DOB [birth year], MRN [id number]  PCP:  Pcp Not In System  Primary Cardiologist:  Dr. [name]     History of Present Illness: Christopher [name] is a 62 y.o. male who returns for f/u after a recent admission for MV repair 3/25-3/30.  He has a hx of mitral valve prolapse and prior mitral valve endocarditis with severe MR. Last TTE in 7/13 showed EF 55-60% with partial flail posterior leaflet and at least moderate but probably severe eccentric anteriorly directed MR. The LV was upper normal in size.  He developed exertional dyspnea with going up a flight of steps or going up a hill.  TEE 08/2012 showed flail P2 and P1 segments of the mitral valve with a ruptured chord. There was severe MR. LHC showed 95% mRCA stenosis.  Patient wanted a minimally invasive mitral valve repair.  He was set up for PCI to the mid RCA, which was done using BMS.  He remained on Plavix for one month after PCI.  He was then referred to Dr. Cornelius Moras for mitral valve repair.  He underwent minimally invasive mitral valve repair with complex valvuloplasty including triangular resection of flail posterior leaflet and a Sorin Memo 3-D ring annuloplasty 09/27/12.  Patient remained in sinus rhythm. Postoperative course was fairly uneventful. He was started on Coumadin.    He had been doing well since d/c until this week.  He notes a dry cough.  No sputum production or hemoptysis.  Also notes increased DOE.  He has been walking 20-30 mins.  Has noted having to stop earlier this week.  No orthopnea or PND.  He does note increased cough after eating and laying flat.  No syncope.  No chest pressure or tightness.  Incision is sore but improving.  Weights stable.  No edema.    Labs (7/12): LDL 128  Labs (7/13): K 3.9, creatinine 0.9, HDL 54, LDL 133  Labs (3/14): K 3.7, Cr 0.84, Hgb 10.8   Wt  Readings from Last 3 Encounters:  10/10/12 218 lb (98.884 kg)  10/02/12 217 lb 2.5 oz (98.5 kg)  10/02/12 217 lb 2.5 oz (98.5 kg)     Past Medical History  Diagnosis Date  . Hypertension   . Endocarditis 2009    Strep viridans  . Rhinitis, allergic   . Severe mitral regurgitation     a. Mitral valve prolapse with flail segment of posterior leaflet and severe MR by TEE, remote h/o bacterial endocarditis   . Coronary artery disease     a. 2/7 Cath: LM nl, LAD min irregs, LCX min irregs, RI 40, RCA 24m, EF 55-60% basal to mid inf HK, 3-4+ MR;  b. 08/25/2012 PCI of RCA with 4.0x15 Vision BMS  . 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 Sleep apnea     NPSG 01/21/06- AHI 40.7/hr cpap  . Heart murmur   . GERD (gastroesophageal reflux disease)     hx  . Arthritis   . 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 acetaminophen (TYLENOL) 325 MG tablet Take 650 mg by mouth every 6 (six) hours as needed for  pain.      . aspirin EC 81 MG tablet Take 1 tablet (81 mg total) by mouth daily.  30 tablet  1  . atorvastatin (LIPITOR) 20 MG tablet Take 1 tablet (20 mg total) by mouth daily.  90 tablet  3  . cetirizine (ZYRTEC) 10 MG tablet Take 10 mg by mouth daily.        . Cholecalciferol (VITAMIN D) 2000 UNITS CAPS Take 1 capsule by mouth daily.      . fluticasone (FLONASE) 50 MCG/ACT nasal spray Place 2 sprays into the nose daily.  48 g  11  . metoprolol tartrate (LOPRESSOR) 25 MG tablet Take 1 tablet (25 mg total) by mouth 2 (two) times daily.  180 tablet  3  . ramipril (ALTACE) 10 MG capsule Take 10 mg by mouth every morning.      . warfarin (COUMADIN) 5 MG tablet Take 1 tablet daily or as directed by Coumadin Clinic  60 tablet  1   No current  facility-administered medications for this visit.    Allergies:    Allergies  Allergen Reactions  . Codeine     Causes bad constipation    Social History:  The patient  reports that he quit smoking about 34 years ago. His smoking use included Cigarettes. He has a 12.5 pack-year smoking history. He does not have any smokeless tobacco history on file. He reports that  drinks alcohol. He reports that he does not use illicit drugs.   ROS:  Please see the history of present illness.   No bleeding problems.   All other systems reviewed and negative.   PHYSICAL EXAM: VS:  BP 138/90  Pulse 82  Ht 5\' 10"  (1.778 m)  Wt 218 lb (98.884 kg)  BMI 31.28 kg/m2 Well nourished, well developed, in no acute distress HEENT: normal Neck: no JVD Cardiac:  normal S1, S2; RRR; no murmur Chest:  Right chest incision well healed without erythema or d/c Lungs:  Decreased breath sounds in right base with + egophony 1/2 up the chest; no rales Abd: soft, nontender, no hepatomegaly Ext: no edema Skin: warm and dry Neuro:  CNs 2-12 intact, no focal abnormalities noted  EKG:  NSR, HR 82, RBBB, no change from prior tracing     ASSESSMENT AND PLAN:  1. Cough:  Etiology not clear. He does not have any infectious symptoms. His last chest x-ray 2 weeks ago did demonstrate improving right lung base atelectasis and small effusion. On exam, he does have egophony in the right base. His weights have been stable. He does not appear volume overloaded. He is on an ACE inhibitor. However, he has been on this for many years without significant symptoms. He questions whether or not his symptoms may be related to allergic rhinitis from increased pollen counts.  He does describe worsening symptoms after eating and lying flat. I will obtain a chest x-ray today. I will also obtain a CBC, basic metabolic panel and BNP. If he has a worsening effusion, he may need referral back to Dr. Cornelius Moras.  We may need to also consider the addition of  Lasix. He has been taking antihistamines without much relief. I will give him Tessalon Perles to use as needed for cough. I will also give him Pepcid 20 mg twice a day to cover for the possibility of GERD causing his cough. He will be brought back in close follow up. 2. Mitral Regurgitation, Status Post Repair:  As above.  Will arrange f/u  echo.  He remains on coumadin. 3. Hypertension:  Fair control. Continue current Rx. 4. Hyperlipidemia:  Continue statin. 5. CAD:  Continue ASA with recent stent.  No angina. 6. Disposition:  F/u with me or Dr. [name] in 1 week.  Luna Glasgow, PA-C  8:32 AM 10/21/2012

## 2012-10-21 NOTE — Patient Instructions (Addendum)
START TESSALON PERLES 100 MG 1 CAP THREE TIMES DAILY AS NEEDED FOR COUGH START PEPCID 20 MG 1 TAB TWICE DAILY  LABS TODAY; BMET, CBC W/DIFF, BNP  CHEST X-RAY TODAY AT [facility] RADIOLOGY DEPT DX COUGH, SOB  Your physician has requested that you have an echocardiogram DX MITRAL VALVE REPAIR. Echocardiography is a painless test that uses sound waves to create images of your heart. It provides your doctor with information about the size and shape of your heart and how well your heart's chambers and valves are working. This procedure takes approximately one hour. There are no restrictions for this procedure.  YOU HAVE A FOLLOW UP APPT WITH DR. [name] ON 10/25/12 @ 2 PM

## 2012-10-21 NOTE — Telephone Encounter (Signed)
pt notified about cxr results with verbal understanding today. I will call [facility] Dr. Cornelius Moras Monday to get sooner appt per Bing Neighbors. PA,. pt said labs not done today because lab person could not get the blood and he was tired of being stuck. labs on 4/22

## 2012-10-21 NOTE — Telephone Encounter (Signed)
Message copied by Tarri Fuller on Fri Oct 21, 2012  2:20 PM ------      Message from: [place], [place] T      Created: Fri Oct 21, 2012  1:41 PM       R effusion is larger.      Labs pending.      Start Lasix 40 mg QD and K+ 20 mEq QD.      Please arrange appointment with Dr. Cornelius Moras or one of his partners at [facility] next Monday (unless someone can see him this afternoon).      Check BMET again in 1 week.      Tereso Newcomer, PA-C  1:40 PM 10/21/2012 ------

## 2012-10-24 ENCOUNTER — Ambulatory Visit (INDEPENDENT_AMBULATORY_CARE_PROVIDER_SITE_OTHER): Payer: BC Managed Care – PPO | Admitting: Physician Assistant

## 2012-10-24 ENCOUNTER — Telehealth: Payer: Self-pay | Admitting: *Deleted

## 2012-10-24 ENCOUNTER — Other Ambulatory Visit: Payer: Self-pay

## 2012-10-24 VITALS — BP 124/69 | HR 61 | Resp 16 | Ht 70.0 in | Wt 218.0 lb

## 2012-10-24 DIAGNOSIS — Z9889 Other specified postprocedural states: Secondary | ICD-10-CM

## 2012-10-24 DIAGNOSIS — I059 Rheumatic mitral valve disease, unspecified: Secondary | ICD-10-CM

## 2012-10-24 DIAGNOSIS — I341 Nonrheumatic mitral (valve) prolapse: Secondary | ICD-10-CM

## 2012-10-24 DIAGNOSIS — J90 Pleural effusion, not elsewhere classified: Secondary | ICD-10-CM

## 2012-10-24 DIAGNOSIS — I340 Nonrheumatic mitral (valve) insufficiency: Secondary | ICD-10-CM

## 2012-10-24 NOTE — Telephone Encounter (Signed)
I called [facility] today and pt is going to be seen today by PA for Dr. Cornelius Moras, due to right pleural effusion getting worse per Bing Neighbors. PA. pt notified about appt for today and said ok

## 2012-10-24 NOTE — Progress Notes (Signed)
[address]            [place] [address]          [phone]       GEOGE LAWRANCE [facility] Medical Record #[id number] Date of Birth: 1951/03/28  [name], MD Tally Due, MD  Chief Complaint:   PostOp Follow Up Visit   History of Present Illness:     This is a 62 year old Caucasian male who is s/p mini mitral valve repair on 09/27/2012 by Dr. Cornelius Moras. He was last seen in follow up on 4/7, at which time, he was doing well. He presents today because he has had increasing shortness of breath. He initially called the cardiologist's office. A chest x ray was done on 4/17, which showed no pneumothorax, right atelectasis,increasing right pleural effusion, likely fluid in fissure on right, and cardiomegaly.He does take Coumadin and his INR last week was 2.4.    History  Smoking status  . Former Smoker -- 2.50 packs/day for 5 years  . Types: Cigarettes  . Quit date: 07/06/1978  Smokeless tobacco  . Not on file    Comment: social drinker       Allergies  Allergen Reactions  . Codeine     Causes bad constipation  . Dust Mite Extract   . Mold Extract (Trichophyton)   . Pollen Extract     Current Outpatient Prescriptions  Medication Sig Dispense Refill  . acetaminophen (TYLENOL) 325 MG tablet Take 650 mg by mouth as needed for pain.       Marland Kitchen aspirin EC 81 MG tablet Take 1 tablet (81 mg total) by mouth daily.  30 tablet  1  . atorvastatin (LIPITOR) 20 MG tablet Take 1 tablet (20 mg total) by mouth daily.  90 tablet  3  . benzonatate (TESSALON) 100 MG capsule Take 1 capsule (100 mg total) by mouth 3 (three) times daily as needed for cough.  20 capsule  0  . cetirizine (ZYRTEC) 10 MG tablet Take 10 mg by mouth daily.        . Cholecalciferol (VITAMIN D) 2000 UNITS CAPS Take 1 capsule by mouth daily.      . famotidine (PEPCID) 20 MG tablet Take 1 tablet (20 mg total) by mouth 2 (two) times daily.  60 tablet  11  . fluticasone  (FLONASE) 50 MCG/ACT nasal spray Place 2 sprays into the nose daily.  48 g  11  . furosemide (LASIX) 40 MG tablet Take 1 tablet (40 mg total) by mouth daily.  30 tablet  11  . metoprolol tartrate (LOPRESSOR) 25 MG tablet Take 1 tablet (25 mg total) by mouth 2 (two) times daily.  180 tablet  3  . potassium chloride SA (K-DUR,KLOR-CON) 20 MEQ tablet Take 1 tablet (20 mEq total) by mouth daily.  30 tablet  11  . ramipril (ALTACE) 10 MG capsule Take 10 mg by mouth every morning.      . warfarin (COUMADIN) 5 MG tablet Take 1 tablet daily or as directed by [facility]  60 tablet  1   No current facility-administered medications for this visit.    Physical Exam: BP 124/69  Pulse 61  Resp 16  Ht 5\' 10"  (1.778 m)  Wt 98.884 kg (218 lb)  BMI 31.28 kg/m2  SpO2 96%  General appearance: alert, fatigued and no distress Neurologic:  intact Heart: irregularly irregular rhythm Lungs: Diminshed at right base;clear on left;no rales, wheezes, or rhonchi Extremities: No lower extremity edema Wound: Clean and dry       Recent Radiology Findings: No results found.    Recent Labs: Lab Results  Component Value Date   WBC 10.2 09/30/2012   HGB 10.8* 09/30/2012   HCT 33.4* 09/30/2012   PLT 97* 09/30/2012   GLUCOSE 106* 09/30/2012   CHOL 223* 01/08/2012   TRIG 104.0 01/08/2012   HDL 53.90 01/08/2012   LDLDIRECT 138.0 01/08/2012   LDLCALC 128* 01/16/2011   ALT 25 09/23/2012   AST 21 09/23/2012   NA 136 09/30/2012   K 3.7 09/30/2012   CL 102 09/30/2012   CREATININE 0.84 09/30/2012   BUN 21 09/30/2012   CO2 26 09/30/2012   TSH 2.557 Test methodology is 3rd generation TSH 10/12/2007   INR 2.4 10/21/2012   HGBA1C 5.7* 09/23/2012      Assessment / Plan:   As discussed with Dr. Cornelius Moras, we will arrange for a right thoracentesis as it appears the right pleural effusion is increased. He is on Coumadin and has already taken it this morning. I instructed him to NOT take Coumadin Tuesday, Wednesday, or Thursday. We are  arranging for the right thoracentesis to be done on Thursday 4/24 in IR at [facility].  The patient also stated he was given Lasix and potassium by the cardiologist's office last week . I have instructed him to take this. Once he has the thoracentesis, he hopefully will not need to take it anymore. He has an appointment to see Dr. [name] in the morning. He was encouraged to keep this appointment as he may be having an echocardiogram done. He will return to see Dr. Cornelius Moras in one week with a chest x ray.    Ardelle Balls PA-C 10/24/2012 3:36 PM

## 2012-10-24 NOTE — Telephone Encounter (Signed)
Message copied by Tarri Fuller on Mon Oct 24, 2012 10:21 AM ------      Message from: [place], [place] T      Created: Mon Oct 24, 2012  8:28 AM       Please call [facility] this AM to get appt for pleural effusion      Thanks      Tereso Newcomer, PA-C  8:29 AM 10/24/2012              ----- Message -----         From: Tarri Fuller, CMA         Sent: 10/21/2012   2:32 PM           To: Beatrice Lecher, PA-C            pt notified about cxr results with verbal understanding today. I will call [facility] Dr. Cornelius Moras Monday to get sooner appt per Bing Neighbors. PA,. pt said labs not done today because lab person could not get the blood and he was tired of being stuck. labs on 4/22       ------

## 2012-10-25 ENCOUNTER — Ambulatory Visit (HOSPITAL_COMMUNITY): Payer: BC Managed Care – PPO | Attending: Cardiovascular Disease | Admitting: Radiology

## 2012-10-25 ENCOUNTER — Ambulatory Visit (INDEPENDENT_AMBULATORY_CARE_PROVIDER_SITE_OTHER): Payer: BC Managed Care – PPO | Admitting: Cardiology

## 2012-10-25 ENCOUNTER — Telehealth: Payer: Self-pay | Admitting: *Deleted

## 2012-10-25 ENCOUNTER — Encounter: Payer: Self-pay | Admitting: Cardiology

## 2012-10-25 ENCOUNTER — Other Ambulatory Visit (INDEPENDENT_AMBULATORY_CARE_PROVIDER_SITE_OTHER): Payer: BC Managed Care – PPO

## 2012-10-25 VITALS — BP 126/88 | HR 61 | Ht 70.0 in | Wt 214.0 lb

## 2012-10-25 DIAGNOSIS — I509 Heart failure, unspecified: Secondary | ICD-10-CM

## 2012-10-25 DIAGNOSIS — I359 Nonrheumatic aortic valve disorder, unspecified: Secondary | ICD-10-CM | POA: Insufficient documentation

## 2012-10-25 DIAGNOSIS — I5023 Acute on chronic systolic (congestive) heart failure: Secondary | ICD-10-CM | POA: Insufficient documentation

## 2012-10-25 DIAGNOSIS — G4730 Sleep apnea, unspecified: Secondary | ICD-10-CM | POA: Insufficient documentation

## 2012-10-25 DIAGNOSIS — I059 Rheumatic mitral valve disease, unspecified: Secondary | ICD-10-CM

## 2012-10-25 DIAGNOSIS — E785 Hyperlipidemia, unspecified: Secondary | ICD-10-CM

## 2012-10-25 DIAGNOSIS — I340 Nonrheumatic mitral (valve) insufficiency: Secondary | ICD-10-CM

## 2012-10-25 DIAGNOSIS — Z87891 Personal history of nicotine dependence: Secondary | ICD-10-CM | POA: Insufficient documentation

## 2012-10-25 DIAGNOSIS — R0602 Shortness of breath: Secondary | ICD-10-CM

## 2012-10-25 DIAGNOSIS — I5022 Chronic systolic (congestive) heart failure: Secondary | ICD-10-CM | POA: Insufficient documentation

## 2012-10-25 DIAGNOSIS — I2510 Atherosclerotic heart disease of native coronary artery without angina pectoris: Secondary | ICD-10-CM

## 2012-10-25 DIAGNOSIS — R0989 Other specified symptoms and signs involving the circulatory and respiratory systems: Secondary | ICD-10-CM

## 2012-10-25 DIAGNOSIS — I480 Paroxysmal atrial fibrillation: Secondary | ICD-10-CM | POA: Insufficient documentation

## 2012-10-25 DIAGNOSIS — I10 Essential (primary) hypertension: Secondary | ICD-10-CM | POA: Insufficient documentation

## 2012-10-25 DIAGNOSIS — I4891 Unspecified atrial fibrillation: Secondary | ICD-10-CM

## 2012-10-25 LAB — CBC WITH DIFFERENTIAL/PLATELET
Basophils Absolute: 0.1 10*3/uL (ref 0.0–0.1)
Basophils Relative: 0.7 % (ref 0.0–3.0)
Eosinophils Absolute: 0.2 10*3/uL (ref 0.0–0.7)
Eosinophils Relative: 2.6 % (ref 0.0–5.0)
HCT: 39.4 % (ref 39.0–52.0)
Hemoglobin: 13.1 g/dL (ref 13.0–17.0)
Lymphocytes Relative: 17.6 % (ref 12.0–46.0)
Lymphs Abs: 1.4 10*3/uL (ref 0.7–4.0)
MCHC: 33.2 g/dL (ref 30.0–36.0)
MCV: 85.2 fl (ref 78.0–100.0)
Monocytes Absolute: 0.8 10*3/uL (ref 0.1–1.0)
Monocytes Relative: 10.1 % (ref 3.0–12.0)
Neutro Abs: 5.4 10*3/uL (ref 1.4–7.7)
Neutrophils Relative %: 69 % (ref 43.0–77.0)
Platelets: 259 10*3/uL (ref 150.0–400.0)
RBC: 4.63 Mil/uL (ref 4.22–5.81)
RDW: 14.4 % (ref 11.5–14.6)
WBC: 7.8 10*3/uL (ref 4.5–10.5)

## 2012-10-25 LAB — BASIC METABOLIC PANEL
BUN: 17 mg/dL (ref 6–23)
CO2: 30 mEq/L (ref 19–32)
Calcium: 8.6 mg/dL (ref 8.4–10.5)
Chloride: 102 mEq/L (ref 96–112)
Creatinine, Ser: 1.2 mg/dL (ref 0.4–1.5)
GFR: 67.55 mL/min (ref >60.00)
Glucose, Bld: 78 mg/dL (ref 70–99)
Potassium: 4.2 mEq/L (ref 3.5–5.1)
Sodium: 138 mEq/L (ref 135–145)

## 2012-10-25 LAB — BRAIN NATRIURETIC PEPTIDE: Pro B Natriuretic peptide (BNP): 275 pg/mL — ABNORMAL HIGH (ref 0.0–100.0)

## 2012-10-25 MED ORDER — AMIODARONE HCL 200 MG PO TABS: ORAL_TABLET | ORAL | Status: DC

## 2012-10-25 MED ORDER — METOPROLOL SUCCINATE ER 25 MG PO TB24: 25.0000 mg | ORAL_TABLET | Freq: Two times a day (BID) | ORAL | Status: DC

## 2012-10-25 NOTE — Telephone Encounter (Signed)
Message copied by Tarri Fuller on Tue Oct 25, 2012  2:05 PM ------      Message from: [place], [place] T      Created: Tue Oct 25, 2012  1:29 PM       Potassium and kidney function look good.      Hgb ok.  WBC normal.      Continue with current treatment plan.      Tereso Newcomer, PA-C  4:49 PM 05/19/2012 ------

## 2012-10-25 NOTE — Progress Notes (Signed)
Echocardiogram performed.  

## 2012-10-25 NOTE — Progress Notes (Signed)
Patient ID: Christopher [name], male   DOB: 1950/10/18, 62 y.o.   MRN: [id number] PCP: Dr. Perrin Maltese  62 yo with history of mitral valve prolapse and prior mitral valve endocarditis with severe mitral regurgitation presents for cardiology followup. He had a TEE in 7/13 that showed EF 55-60% with partial flail posterior leaflet and at least moderate but probably severe eccentric anteriorly directed MR.  The LV was upper normal in size.  Patient had noted the onset of exertional dyspnea.  No chest pain.  I took him for TEE, which showed flail P2 and P1 segments of the mitral valve with a ruptured chord.  There was severe MR.  LHC showed 95% mRCA stenosis.  As he wanted a minimally invasive mitral valve repair, arranged for PCI to the mid RCA, which was done using BMS.  After 1 month of Plavix, he had a minimally-invasive mitral valve repair with uncomplicated hospital course.  He was started on coumadin for a few months post-operatively.  Initially, he did well and was walking for 30 minutes a day.  However, over the last 2-3 weeks, he has noted dyspnea with walking about 50 feet or walking up steps.  He was seen by Tereso Newcomer and CXR was done.  He was also started on Lasix.  Weight is down 4 lbs but his breathing has not improved.  CXR showed a new right pleural effusion and he is off coumadin for thoracentesis on Thursday.  No significant chest pain, he just has some surgical site soreness that is mild.  His pulse was irregular today and ECG showed atrial fibrillation.  Also of note, post-op echo was done today.  The mitral valve looked good but EF was 35-40% with global hypokinesis.   ECG: atrial fibrillation at 93, LAFB, RBBB  Labs (7/12): LDL 128 Labs (7/13): K 3.9, creatinine 0.9, HDL 54, LDL 133 Labs (4/14): K 4.2, creatinine 1.2  PMH: 1. HTN 2. H/o sinusitis 3. Nephrolithiasis 4. Mitral valve disease: Baseline mitral valve prolapse.  Had MV endocarditis (Strep viridans) in 4/09.  Has had moderate to  severe mitral regurgitation.  Echo (7/12): EF 60%, upper normal LV size (EDD 55 mm, ESD 35 mm), moderate (grade II) diastolic dysfunction with E/medial e' < 15, moderate to possibly severe eccentric MR with mitral valve prolapse and partial flail posterior leaflet, mild LAE. Echo (7/13): EF 55-60%, upper normal LV size, grade II diastolic dysfunction with E/e' < 15, IVC normal, flail segment of posterior leaflet with at least moderate and probably severe anteriorly directed MR, normal RV, unable to measure PA systolic pressure.  TEE (2/14) with EF 55-60%, flail P2 and P1 segments of the posterior leaflet with a ruptured chord, eccentric severe MR.  Status post minimally-invasive mitral valve repair in 3/14.  5. CAD: LHC (2/14) showed 95% mRCA stenosis.  This was treated with BMS.  6. Atrial fibrillation: First noted in 4/14.  7. Cardiomyopathy: Echo (4/14) with EF 35-40%, diffuse hypokinesis, s/p mitral valve repair with no mitral regurgitation.   SH: Works at [employer] on [employer].  Divorced.  Occasional ETOH.  No smoking.   FH: Father with CAD, brother with MI at 59.    ROS: All systems reviewed and negative except as per HPI.   Current Outpatient Prescriptions  Medication Sig Dispense Refill  . acetaminophen (TYLENOL) 325 MG tablet Take 650 mg by mouth as needed for pain.       Marland Kitchen amiodarone (PACERONE) 200 MG tablet 2 tablets (total 400mg )  two times a day for 1 week then decrease to 1 tablet (200mg ) two times a day  120 tablet  3  . aspirin EC 81 MG tablet Take 1 tablet (81 mg total) by mouth daily.  30 tablet  1  . atorvastatin (LIPITOR) 20 MG tablet Take 1 tablet (20 mg total) by mouth daily.  90 tablet  3  . benzonatate (TESSALON) 100 MG capsule Take 1 capsule (100 mg total) by mouth 3 (three) times daily as needed for cough.  20 capsule  0  . cetirizine (ZYRTEC) 10 MG tablet Take 10 mg by mouth daily.        . Cholecalciferol (VITAMIN D) 2000 UNITS CAPS Take 1 capsule by mouth daily.       . famotidine (PEPCID) 20 MG tablet Take 1 tablet (20 mg total) by mouth 2 (two) times daily.  60 tablet  11  . fluticasone (FLONASE) 50 MCG/ACT nasal spray Place 2 sprays into the nose daily.  48 g  11  . furosemide (LASIX) 40 MG tablet Take 1 tablet (40 mg total) by mouth daily.  30 tablet  11  . metoprolol succinate (TOPROL XL) 25 MG 24 hr tablet Take 1 tablet (25 mg total) by mouth 2 (two) times daily.  60 tablet  6  . potassium chloride SA (K-DUR,KLOR-CON) 20 MEQ tablet Take 1 tablet (20 mEq total) by mouth daily.  30 tablet  11  . ramipril (ALTACE) 10 MG capsule Take 10 mg by mouth every morning.      . warfarin (COUMADIN) 5 MG tablet Take 1 tablet daily or as directed by [facility]madin Clinic  60 tablet  1   No current facility-administered medications for this visit.    BP 126/88  Pulse 61  Ht 5\' 10"  (1.778 m)  Wt 214 lb (97.07 kg)  BMI 30.71 kg/m2  SpO2 96% General: NAD Neck: No JVD, no thyromegaly or thyroid nodule.  Lungs: Decreased breath sounds about 1/2 up right lung field.  CV: Nondisplaced PMI.  Heart regular S1/S2, no S3/S4, no significant murmur.  No peripheral edema.  No carotid bruit.  Normal pedal pulses.  Abdomen: Soft, nontender, no hepatosplenomegaly, no distention.  Neurologic: Alert and oriented x 3.  Psych: Normal affect. Extremities: No clubbing or cyanosis.   Assessment/Plan:  Mitral valve regurgitation  Severe eccentric MR in the setting of MV prolapse and history of MV endocarditis, no s/p minimally invasive MV repair.  The valve looked good on today's echo.   Atrial fibrillation Atrial fibrillation noted for the first time today.  Suspect that this contributes to his dyspnea in addition to the right pleural effusion.   - Stop metoprolol, start Toprol XL 25 mg bid. - Start amiodarone: will use 400 mg bid x 1 week then 200 mg bid x 1 week, then 200 mg daily.  He may be in and out of atrial fibrillation post-op and amiodarone may be all that is needed to keep  him in NSR.   - I will see him back in a week.  If he is still in atrial fibrillation, I will arrange TEE-guided DCCV as he seems to be symptomatic from the atrial fibrillation.  He will restart coumadin after thoracentesis.  CAD Status post PCI to [facility] with BMS.  Stable, no significant ischemic chest pain.  Hyperlipidemia Statin begun with CAD.  Will check lipids/LFTs in 5/14. Cardiomyopathy EF 35-40% with global hypokinesis on post-op echo.  It is possible that this could be tachycardia-mediated  and related to atrial fibrillation.  As above, I am going to work on getting him out of atrial fibrillation.  He is already taking ramipril 10 mg daily and I am going to change metoprolol to Toprol XL 25 mg bid given more evidence in CHF for this med.  He does not appear significantly volume overloaded on exam.  BMET/BNP done today.  Pleural effusion Post-op right pleural effusion.  Thoracentesis Thursday.   [name] 10/25/2012

## 2012-10-25 NOTE — Patient Instructions (Addendum)
Stop lopressor (metoprolol tartrate).  Start Toprol XL (metoprolol succinate) 25mg  two times a day.   Start amiodarone 400mg  two times a day for one week, then decrease to amiodarone 200mg  two times a day. This will be 2 of your 200mg  tablets two times a day for one week, then 1 of your 200mg  tablets two times a day.    You have  a follow-up appointment scheduled with Dr [name] for Tuesday April 29,2014 at 11:15am.

## 2012-10-25 NOTE — Telephone Encounter (Signed)
lmom labs ok. no changes to be made today

## 2012-10-27 ENCOUNTER — Ambulatory Visit (HOSPITAL_COMMUNITY)
Admission: RE | Admit: 2012-10-27 | Discharge: 2012-10-27 | Disposition: A | Discharge status: Home / Self Care | Payer: BC Managed Care – PPO | Source: Ambulatory Visit | Attending: Radiology | Admitting: Radiology

## 2012-10-27 ENCOUNTER — Ambulatory Visit (HOSPITAL_COMMUNITY)
Admission: RE | Admit: 2012-10-27 | Discharge: 2012-10-27 | Disposition: A | Discharge status: Home / Self Care | Payer: BC Managed Care – PPO | Source: Ambulatory Visit | Attending: Thoracic Surgery (Cardiothoracic Vascular Surgery) | Admitting: Thoracic Surgery (Cardiothoracic Vascular Surgery)

## 2012-10-27 ENCOUNTER — Other Ambulatory Visit: Payer: Self-pay | Admitting: *Deleted

## 2012-10-27 DIAGNOSIS — I059 Rheumatic mitral valve disease, unspecified: Secondary | ICD-10-CM

## 2012-10-27 DIAGNOSIS — J90 Pleural effusion, not elsewhere classified: Secondary | ICD-10-CM

## 2012-10-27 NOTE — Procedures (Signed)
Successful US guided right thoracentesis. Yielded 1.1L of blood tinged fluid. Pt tolerated procedure well. No immediate complications.  Specimen was not sent for labs. CXR ordered.  Brayton El PA-C 10/27/2012 11:45 AM

## 2012-10-31 ENCOUNTER — Ambulatory Visit (INDEPENDENT_AMBULATORY_CARE_PROVIDER_SITE_OTHER): Payer: BC Managed Care – PPO | Admitting: Physician Assistant

## 2012-10-31 ENCOUNTER — Ambulatory Visit
Admission: RE | Admit: 2012-10-31 | Discharge: 2012-10-31 | Disposition: A | Discharge status: Home / Self Care | Payer: BC Managed Care – PPO | Source: Ambulatory Visit | Attending: Thoracic Surgery (Cardiothoracic Vascular Surgery) | Admitting: Thoracic Surgery (Cardiothoracic Vascular Surgery)

## 2012-10-31 VITALS — BP 133/81 | HR 82 | Resp 20 | Ht 70.0 in | Wt 214.0 lb

## 2012-10-31 DIAGNOSIS — Z9889 Other specified postprocedural states: Secondary | ICD-10-CM

## 2012-10-31 DIAGNOSIS — I059 Rheumatic mitral valve disease, unspecified: Secondary | ICD-10-CM

## 2012-10-31 DIAGNOSIS — J90 Pleural effusion, not elsewhere classified: Secondary | ICD-10-CM

## 2012-10-31 NOTE — Progress Notes (Addendum)
[address]            [name] [address]          [phone]     HPI: Patient returns for 1 week office follow-up.  He is status post a mini mitral valve repair by Dr. Cornelius Moras on 09/27/2012.  He was seen in the office last week with complaints of increasing shortness of breath.  A chest x-ray showed a new large right pleural effusion.  His Coumadin was held and he underwent an ultrasound guided right thoracentesis in radiology on 4/24//2014, yielding 1.1 liters of serosanguinous fluid.    Since his last visit, he saw Dr. [name], and was noted to be in atrial fibrillation.  His metoprolol was changed to Toprol XL 25 mg bid, and started on Amiodarone.  An echo was performed, and showed good mitral valve function, with EF 35-40% with global hypokinesis.   Today, the patient states he is feeling better.  His breathing is improved, although he does not feel 100% yet.  His appetite is ok, and he has not noted any palpitations.  He has been continued on Lasix, and denies any lower extremity edema.    Current Outpatient Prescriptions  Medication Sig Dispense Refill  . acetaminophen (TYLENOL) 325 MG tablet Take 650 mg by mouth as needed for pain.       Marland Kitchen amiodarone (PACERONE) 200 MG tablet 2 tablets (total 400mg ) two times a day for 1 week then decrease to 1 tablet (200mg ) two times a day  120 tablet  3  . aspirin EC 81 MG tablet Take 1 tablet (81 mg total) by mouth daily.  30 tablet  1  . atorvastatin (LIPITOR) 20 MG tablet Take 1 tablet (20 mg total) by mouth daily.  90 tablet  3  . cetirizine (ZYRTEC) 10 MG tablet Take 10 mg by mouth daily.        . Cholecalciferol (VITAMIN D) 2000 UNITS CAPS Take 1 capsule by mouth daily.      . fluticasone (FLONASE) 50 MCG/ACT nasal spray Place 2 sprays into the nose daily.  48 g  11  . furosemide (LASIX) 40 MG tablet Take 1 tablet (40 mg total) by mouth daily.  30 tablet  11  . metoprolol succinate (TOPROL XL) 25 MG 24 hr  tablet Take 1 tablet (25 mg total) by mouth 2 (two) times daily.  60 tablet  6  . potassium chloride SA (K-DUR,KLOR-CON) 20 MEQ tablet Take 1 tablet (20 mEq total) by mouth daily.  30 tablet  11  . ramipril (ALTACE) 10 MG capsule Take 10 mg by mouth every morning.      . warfarin (COUMADIN) 5 MG tablet Take 1 tablet daily or as directed by Coumadin Clinic  60 tablet  1   No current facility-administered medications for this visit.     Physical Exam: BP 133/81 HR 82 Resp 20 Wounds: Clean and dry Heart: regular rate and rhythm Lungs: Diminished breath sounds in right base, otherwise clear Extremities: No edema   Diagnostic Tests: Chest xray: Dg Chest 2 View  10/31/2012  *RADIOLOGY REPORT*  Clinical Data: Status post mitral valve replacement 09/27/2012. History of effusion and shortness of breath.  CHEST - 2 VIEW  Comparison: 10/27/2012.  Findings: Trachea is midline.  Heart is enlarged, stable.  Lungs are low in volume with a moderate right pleural  effusion and right basilar airspace disease.  No pneumothorax.  No edema.  IMPRESSION: Moderate right pleural effusion with right basilar airspace disease.   Original Report Authenticated By: Leanna Battles, M.D.        Assessment/Plan: Mr. Bergdoll is doing well status post mini MV repair.  He currently is in sinus rhythm, and his effusion has improved by x-ray.  He is symptomatically improved, as well.  He may continue to increase his activity as tolerated, although he is asked to limit his lifting to 10 pounds for now.  He was also encouraged to participate in outpatient cardiac rehab. He is scheduled to follow up with Dr. [name] later this week.  He was also seen by Dr. Cornelius Moras today, and we plan to see him back in 4 weeks with a repeat chest x-ray.

## 2012-11-01 ENCOUNTER — Ambulatory Visit (INDEPENDENT_AMBULATORY_CARE_PROVIDER_SITE_OTHER): Payer: BC Managed Care – PPO | Admitting: Cardiology

## 2012-11-01 ENCOUNTER — Encounter: Payer: Self-pay | Admitting: Cardiology

## 2012-11-01 VITALS — BP 126/82 | HR 81 | Ht 70.0 in | Wt 211.0 lb

## 2012-11-01 DIAGNOSIS — I5022 Chronic systolic (congestive) heart failure: Secondary | ICD-10-CM

## 2012-11-01 DIAGNOSIS — R0989 Other specified symptoms and signs involving the circulatory and respiratory systems: Secondary | ICD-10-CM

## 2012-11-01 DIAGNOSIS — Z9889 Other specified postprocedural states: Secondary | ICD-10-CM

## 2012-11-01 DIAGNOSIS — E785 Hyperlipidemia, unspecified: Secondary | ICD-10-CM

## 2012-11-01 DIAGNOSIS — R0609 Other forms of dyspnea: Secondary | ICD-10-CM

## 2012-11-01 DIAGNOSIS — I2510 Atherosclerotic heart disease of native coronary artery without angina pectoris: Secondary | ICD-10-CM

## 2012-11-01 DIAGNOSIS — I059 Rheumatic mitral valve disease, unspecified: Secondary | ICD-10-CM

## 2012-11-01 DIAGNOSIS — I509 Heart failure, unspecified: Secondary | ICD-10-CM

## 2012-11-01 DIAGNOSIS — I4891 Unspecified atrial fibrillation: Secondary | ICD-10-CM

## 2012-11-01 LAB — TSH: TSH: 2.75 u[IU]/mL (ref 0.35–5.50)

## 2012-11-01 LAB — HEPATIC FUNCTION PANEL
ALT: 34 U/L (ref 0–53)
AST: 31 U/L (ref 0–37)
Albumin: 3.5 g/dL (ref 3.5–5.2)
Alkaline Phosphatase: 63 U/L (ref 39–117)
Bilirubin, Direct: 0 mg/dL (ref 0.0–0.3)
Total Bilirubin: 0.6 mg/dL (ref 0.3–1.2)
Total Protein: 7 g/dL (ref 6.0–8.3)

## 2012-11-01 LAB — BRAIN NATRIURETIC PEPTIDE: Pro B Natriuretic peptide (BNP): 132 pg/mL — ABNORMAL HIGH (ref 0.0–100.0)

## 2012-11-01 LAB — BASIC METABOLIC PANEL
BUN: 15 mg/dL (ref 6–23)
CO2: 30 mEq/L (ref 19–32)
Calcium: 8.9 mg/dL (ref 8.4–10.5)
Chloride: 102 mEq/L (ref 96–112)
Creatinine, Ser: 1.1 mg/dL (ref 0.4–1.5)
GFR: 68.91 mL/min (ref >60.00)
Glucose, Bld: 80 mg/dL (ref 70–99)
Potassium: 4.6 mEq/L (ref 3.5–5.1)
Sodium: 139 mEq/L (ref 135–145)

## 2012-11-01 MED ORDER — METOPROLOL SUCCINATE ER 50 MG PO TB24: ORAL_TABLET | ORAL | Status: DC

## 2012-11-01 NOTE — Patient Instructions (Addendum)
Change Toprol XL (metoprolol succinate)  to 75mg  daily. You can take 3 of your 25mg  tablets daily at the same time and use your current supply. The new prescription will be 1 and 1/2 of a 50mg  tablet daily.  Your physician recommends that you have lab work today--BMET/BNP/TSH/Liver profile.   You have been referred to Cardiac Rehab at [facility].  Your physician recommends that you schedule a follow-up appointment in: 1 month with Dr [name]. This is scheduled for Wednesday June 4,2014 at 3:45PM.   Your physician has requested that you have an echocardiogram. Echocardiography is a painless test that uses sound waves to create images of your heart. It provides your doctor with information about the size and shape of your heart and how well your heart's chambers and valves are working. This procedure takes approximately one hour. There are no restrictions for this procedure. In 3 MONTHS

## 2012-11-02 ENCOUNTER — Telehealth: Payer: Self-pay | Admitting: Cardiology

## 2012-11-02 NOTE — Progress Notes (Signed)
Patient ID: Christopher [name], male   DOB: 1951-07-05, 62 y.o.   MRN: [id number] PCP: Dr. Perrin Maltese  62 yo with history of mitral valve prolapse and prior mitral valve endocarditis with severe mitral regurgitation presents for cardiology followup. He had a TEE in 7/13 that showed EF 55-60% with partial flail posterior leaflet and at least moderate but probably severe eccentric anteriorly directed MR.  The LV was upper normal in size.  Patient had noted the onset of exertional dyspnea.  No chest pain.  I took him for TEE, which showed flail P2 and P1 segments of the mitral valve with a ruptured chord.  There was severe MR.  LHC showed 95% mRCA stenosis.  As he wanted a minimally invasive mitral valve repair, arranged for PCI to the mid RCA, which was done using BMS.  After 1 month of Plavix, he had a minimally-invasive mitral valve repair with uncomplicated hospital course.  He was started on coumadin for a few months post-operatively.  Initially, he did well and was walking for 30 minutes a day.  However,  prior to the last appointment, he noted dyspnea with walking about 50 feet or walking up steps.  Lasix was started.  CXR showed a new right pleural effusion and he had a thoracentesis. He was noted to be in atrial fibrillation as well at last appointment. Post-op echo showed that the mitral valve looked good but EF was 35-40% with global hypokinesis.   Today, he is doing much better.  I put him on amiodarone and he is now back in NSR.  He has lost another 3 lbs on Lasix.  He does not note dypsnea walking, just with carrying a heavy load.  No orthopnea or PND.    ECG: NSR, RBBB, 1st degree AV block.   Labs (7/12): LDL 128 Labs (7/13): K 3.9, creatinine 0.9, HDL 54, LDL 133 Labs (4/14): K 4.2, creatinine 1.2  PMH: 1. HTN 2. H/o sinusitis 3. Nephrolithiasis 4. Mitral valve disease: Baseline mitral valve prolapse.  Had MV endocarditis (Strep viridans) in 4/09.  Has had moderate to severe mitral regurgitation.   Echo (7/12): EF 60%, upper normal LV size (EDD 55 mm, ESD 35 mm), moderate (grade II) diastolic dysfunction with E/medial e' < 15, moderate to possibly severe eccentric MR with mitral valve prolapse and partial flail posterior leaflet, mild LAE. Echo (7/13): EF 55-60%, upper normal LV size, grade II diastolic dysfunction with E/e' < 15, IVC normal, flail segment of posterior leaflet with at least moderate and probably severe anteriorly directed MR, normal RV, unable to measure PA systolic pressure.  TEE (2/14) with EF 55-60%, flail P2 and P1 segments of the posterior leaflet with a ruptured chord, eccentric severe MR.  Status post minimally-invasive mitral valve repair in 3/14.  5. CAD: LHC (2/14) showed 95% mRCA stenosis.  This was treated with BMS.  6. Atrial fibrillation: First noted in 4/14.  7. Cardiomyopathy: Echo (4/14) with EF 35-40%, diffuse hypokinesis, s/p mitral valve repair with no mitral regurgitation.   SH: Works at [employer] on [employer].  Divorced.  Occasional ETOH.  No smoking.   FH: Father with CAD, brother with MI at 49.    ROS: All systems reviewed and negative except as per HPI.   Current Outpatient Prescriptions  Medication Sig Dispense Refill  . acetaminophen (TYLENOL) 325 MG tablet Take 650 mg by mouth as needed for pain.       Marland Kitchen amiodarone (PACERONE) 200 MG tablet 2 tablets (total  400mg ) two times a day for 1 week then decrease to 1 tablet (200mg ) two times a day  120 tablet  3  . aspirin EC 81 MG tablet Take 1 tablet (81 mg total) by mouth daily.  30 tablet  1  . atorvastatin (LIPITOR) 20 MG tablet Take 1 tablet (20 mg total) by mouth daily.  90 tablet  3  . cetirizine (ZYRTEC) 10 MG tablet Take 10 mg by mouth daily.        . Cholecalciferol (VITAMIN D) 2000 UNITS CAPS Take 1 capsule by mouth daily.      . fluticasone (FLONASE) 50 MCG/ACT nasal spray Place 2 sprays into the nose daily.  48 g  11  . furosemide (LASIX) 40 MG tablet Take 1 tablet (40 mg total) by mouth  daily.  30 tablet  11  . potassium chloride SA (K-DUR,KLOR-CON) 20 MEQ tablet Take 1 tablet (20 mEq total) by mouth daily.  30 tablet  11  . ramipril (ALTACE) 10 MG capsule Take 10 mg by mouth every morning.      . warfarin (COUMADIN) 5 MG tablet Take 1 tablet daily or as directed by [facility]  60 tablet  1  . metoprolol succinate (TOPROL-XL) 50 MG 24 hr tablet 1 and 1/2 tablets (total 75mg ) daily. Take with or immediately following a meal.  135 tablet  3   No current facility-administered medications for this visit.    BP 126/82  Pulse 81  Ht 5\' 10"  (1.778 m)  Wt 211 lb (95.709 kg)  BMI 30.28 kg/m2 General: NAD Neck: No JVD, no thyromegaly or thyroid nodule.  Lungs: Slight decreased breath sounds right base. CV: Nondisplaced PMI.  Heart regular S1/S2, no S3/S4, no significant murmur.  No peripheral edema.  No carotid bruit.  Normal pedal pulses.  Abdomen: Soft, nontender, no hepatosplenomegaly, no distention.  Neurologic: Alert and oriented x 3.  Psych: Normal affect. Extremities: No clubbing or cyanosis.   Assessment/Plan:  Mitral valve regurgitation  Severe eccentric MR in the setting of MV prolapse and history of MV endocarditis, no s/p minimally invasive MV repair.  The valve looked good post-op echo.  He will start cardiac rehab.  Atrial fibrillation Now back in NSR on amiodarone.  - Continue amiodarone for now, tapering down to 200 mg daily. Will stop this after 1-2 months most likely if he remains in NSR.  Check LFTs/TSH today.  - Continue coumadin.  Will need to decide whether or not to eventually switch him over to a NOAC and continue long-term anticoagulation.  This will probably depend in part on whether or not he goes back into atrial fibrillation as we get more distant from his cardiac surgery.   CAD Status post PCI to [facility] with BMS.  Stable, no significant ischemic chest pain.  Hyperlipidemia Statin begun with CAD.  Due for lipids next month.   Cardiomyopathy EF 35-40% with global hypokinesis on post-op echo.  It is possible that this could be tachycardia-mediated and related to atrial fibrillation.   - Continue ramipril - Increase Toprol XL to 75 mg daily - Repeat echo in 3 months for recovery.   Pleural effusion Post-op right pleural effusion.  Thoracentesis last week.   [name] 11/02/2012

## 2012-11-02 NOTE — Telephone Encounter (Signed)
Pt.notified

## 2012-11-02 NOTE — Telephone Encounter (Signed)
New Problem:    Patient called in returning Michelle's call regarding his latest lab results.  Please call back.

## 2012-11-03 ENCOUNTER — Encounter: Payer: BC Managed Care – PPO | Admitting: Physician Assistant

## 2012-11-03 ENCOUNTER — Other Ambulatory Visit: Payer: BC Managed Care – PPO

## 2012-11-03 ENCOUNTER — Ambulatory Visit (INDEPENDENT_AMBULATORY_CARE_PROVIDER_SITE_OTHER): Payer: BC Managed Care – PPO

## 2012-11-03 DIAGNOSIS — Z9889 Other specified postprocedural states: Secondary | ICD-10-CM

## 2012-11-03 DIAGNOSIS — I340 Nonrheumatic mitral (valve) insufficiency: Secondary | ICD-10-CM

## 2012-11-03 DIAGNOSIS — I059 Rheumatic mitral valve disease, unspecified: Secondary | ICD-10-CM

## 2012-11-03 DIAGNOSIS — Z7901 Long term (current) use of anticoagulants: Secondary | ICD-10-CM

## 2012-11-03 LAB — POCT INR: INR: 2.8

## 2012-11-04 ENCOUNTER — Ambulatory Visit (INDEPENDENT_AMBULATORY_CARE_PROVIDER_SITE_OTHER): Payer: BC Managed Care – PPO

## 2012-11-04 DIAGNOSIS — I059 Rheumatic mitral valve disease, unspecified: Secondary | ICD-10-CM

## 2012-11-07 ENCOUNTER — Ambulatory Visit: Payer: BC Managed Care – PPO | Admitting: Thoracic Surgery (Cardiothoracic Vascular Surgery)

## 2012-11-07 LAB — HEPATIC FUNCTION PANEL
ALT: 28 U/L (ref 0–53)
AST: 27 U/L (ref 0–37)
Albumin: 3.8 g/dL (ref 3.5–5.2)
Alkaline Phosphatase: 69 U/L (ref 39–117)
Bilirubin, Direct: 0.1 mg/dL (ref 0.0–0.3)
Total Bilirubin: 0.9 mg/dL (ref 0.3–1.2)
Total Protein: 7.3 g/dL (ref 6.0–8.3)

## 2012-11-07 LAB — LIPID PANEL
Cholesterol: 124 mg/dL (ref 0–200)
HDL: 32.9 mg/dL — ABNORMAL LOW (ref >39.00)
LDL Cholesterol: 57 mg/dL (ref 0–99)
Total CHOL/HDL Ratio: 4
Triglycerides: 171 mg/dL — ABNORMAL HIGH (ref 0.0–149.0)
VLDL: 34.2 mg/dL (ref 0.0–40.0)

## 2012-11-14 ENCOUNTER — Ambulatory Visit (INDEPENDENT_AMBULATORY_CARE_PROVIDER_SITE_OTHER): Payer: BC Managed Care – PPO

## 2012-11-14 DIAGNOSIS — I059 Rheumatic mitral valve disease, unspecified: Secondary | ICD-10-CM

## 2012-11-14 DIAGNOSIS — Z7901 Long term (current) use of anticoagulants: Secondary | ICD-10-CM

## 2012-11-14 DIAGNOSIS — I340 Nonrheumatic mitral (valve) insufficiency: Secondary | ICD-10-CM

## 2012-11-14 DIAGNOSIS — Z9889 Other specified postprocedural states: Secondary | ICD-10-CM

## 2012-11-14 LAB — POCT INR: INR: 3.8

## 2012-11-17 ENCOUNTER — Encounter (HOSPITAL_COMMUNITY)
Admission: RE | Admit: 2012-11-17 | Discharge: 2012-11-17 | Disposition: A | Discharge status: Home / Self Care | Payer: BC Managed Care – PPO | Source: Ambulatory Visit | Attending: Cardiology | Admitting: Cardiology

## 2012-11-17 DIAGNOSIS — I2510 Atherosclerotic heart disease of native coronary artery without angina pectoris: Secondary | ICD-10-CM | POA: Insufficient documentation

## 2012-11-17 DIAGNOSIS — I359 Nonrheumatic aortic valve disorder, unspecified: Secondary | ICD-10-CM | POA: Insufficient documentation

## 2012-11-17 DIAGNOSIS — Z5189 Encounter for other specified aftercare: Secondary | ICD-10-CM | POA: Insufficient documentation

## 2012-11-17 DIAGNOSIS — I10 Essential (primary) hypertension: Secondary | ICD-10-CM | POA: Insufficient documentation

## 2012-11-17 DIAGNOSIS — I059 Rheumatic mitral valve disease, unspecified: Secondary | ICD-10-CM | POA: Insufficient documentation

## 2012-11-17 NOTE — Progress Notes (Signed)
Cardiac Rehab Medication Review by a Pharmacist  Does the patient  feel that his/her medications are working for him/her?  yes  Has the patient been experiencing any side effects to the medications prescribed?  Yes, reports his feet feel cold with the Coumadin and has been experiencing constipation. Is taking metamucil, ducolax, and drinking apple juice which seems to be helping.   Does the patient measure his/her own blood pressure or blood glucose at home?  No  Does the patient have any problems obtaining medications due to transportation or finances?   no  Understanding of regimen: good Understanding of indications: good Potential of compliance: excellent    Pharmacist comments: Has a great understanding of his medical conditions, medications and their uses. Recommended trying colace (docusate) to help soften stools.     Lillia Pauls, PharmD Clinical Pharmacist Pager: [phone] Phone: [phone] 11/17/2012 8:26 AM

## 2012-11-21 ENCOUNTER — Ambulatory Visit: Payer: BC Managed Care – PPO | Admitting: Thoracic Surgery (Cardiothoracic Vascular Surgery)

## 2012-11-21 ENCOUNTER — Encounter (HOSPITAL_COMMUNITY): Payer: Self-pay

## 2012-11-21 ENCOUNTER — Encounter (HOSPITAL_COMMUNITY)
Admission: RE | Admit: 2012-11-21 | Discharge: 2012-11-21 | Disposition: A | Discharge status: Home / Self Care | Payer: BC Managed Care – PPO | Source: Ambulatory Visit | Attending: Cardiology | Admitting: Cardiology

## 2012-11-21 NOTE — Progress Notes (Signed)
Pt started cardiac rehab today.  Pt tolerated light exercise without difficulty.  VSS, telemetry-SR first degree AV block, RBBB.  Asymptomatic.  PHQ-zero.  Reviewed quality of life. Pt states he is currently bored at home and looking forward to returning to work in next few weeks.  Pt also reports he is dissatisfied with his current living situation, would prefer to live in a house with yard to take care of.  Pt overalls reports excellent quality of life and exhibits positive coping skills.  Pt reports he is using treadmill at home and is unsure how long he will participate in cardiac rehab.  classtimes will not fit into his work schedule once he returns to work.  Pt oriented to exercise equipment and routine.  Understanding verbalized.

## 2012-11-23 ENCOUNTER — Encounter (HOSPITAL_COMMUNITY)
Admission: RE | Admit: 2012-11-23 | Discharge: 2012-11-23 | Disposition: A | Discharge status: Home / Self Care | Payer: BC Managed Care – PPO | Source: Ambulatory Visit | Attending: Cardiology | Admitting: Cardiology

## 2012-11-24 ENCOUNTER — Ambulatory Visit (INDEPENDENT_AMBULATORY_CARE_PROVIDER_SITE_OTHER): Payer: BC Managed Care – PPO | Admitting: *Deleted

## 2012-11-24 DIAGNOSIS — Z7901 Long term (current) use of anticoagulants: Secondary | ICD-10-CM

## 2012-11-24 DIAGNOSIS — Z9889 Other specified postprocedural states: Secondary | ICD-10-CM

## 2012-11-24 DIAGNOSIS — I059 Rheumatic mitral valve disease, unspecified: Secondary | ICD-10-CM

## 2012-11-24 DIAGNOSIS — I340 Nonrheumatic mitral (valve) insufficiency: Secondary | ICD-10-CM

## 2012-11-24 LAB — POCT INR: INR: 5.4

## 2012-11-25 ENCOUNTER — Encounter (HOSPITAL_COMMUNITY)
Admission: RE | Admit: 2012-11-25 | Discharge: 2012-11-25 | Disposition: A | Discharge status: Home / Self Care | Payer: BC Managed Care – PPO | Source: Ambulatory Visit | Attending: Cardiology | Admitting: Cardiology

## 2012-11-28 ENCOUNTER — Encounter (HOSPITAL_COMMUNITY): Payer: BC Managed Care – PPO

## 2012-11-29 ENCOUNTER — Ambulatory Visit: Payer: BC Managed Care – PPO | Admitting: Cardiology

## 2012-11-30 ENCOUNTER — Encounter (HOSPITAL_COMMUNITY)
Admission: RE | Admit: 2012-11-30 | Discharge: 2012-11-30 | Disposition: A | Discharge status: Home / Self Care | Payer: BC Managed Care – PPO | Source: Ambulatory Visit | Attending: Cardiology | Admitting: Cardiology

## 2012-12-02 ENCOUNTER — Other Ambulatory Visit: Payer: Self-pay | Admitting: *Deleted

## 2012-12-02 ENCOUNTER — Encounter (HOSPITAL_COMMUNITY)
Admission: RE | Admit: 2012-12-02 | Discharge: 2012-12-02 | Disposition: A | Discharge status: Home / Self Care | Payer: BC Managed Care – PPO | Source: Ambulatory Visit | Attending: Cardiology | Admitting: Cardiology

## 2012-12-02 ENCOUNTER — Ambulatory Visit (INDEPENDENT_AMBULATORY_CARE_PROVIDER_SITE_OTHER): Payer: BC Managed Care – PPO | Admitting: *Deleted

## 2012-12-02 DIAGNOSIS — I340 Nonrheumatic mitral (valve) insufficiency: Secondary | ICD-10-CM

## 2012-12-02 DIAGNOSIS — Z9889 Other specified postprocedural states: Secondary | ICD-10-CM

## 2012-12-02 DIAGNOSIS — Z7901 Long term (current) use of anticoagulants: Secondary | ICD-10-CM

## 2012-12-02 DIAGNOSIS — I2510 Atherosclerotic heart disease of native coronary artery without angina pectoris: Secondary | ICD-10-CM

## 2012-12-02 DIAGNOSIS — I059 Rheumatic mitral valve disease, unspecified: Secondary | ICD-10-CM

## 2012-12-02 LAB — POCT INR: INR: 1.5

## 2012-12-02 NOTE — Progress Notes (Signed)
Reviewed home exercise guidelines with patient including endpoints, temperature precautions, target heart rate and rate of perceived exertion. Pt plans to walk and has a manual treadmill as his mode of home exercise. Pt voices understanding of instructions given.  Cristy Hilts, MS, ACSM CES

## 2012-12-05 ENCOUNTER — Encounter (HOSPITAL_COMMUNITY)
Admission: RE | Admit: 2012-12-05 | Discharge: 2012-12-05 | Disposition: A | Discharge status: Home / Self Care | Payer: BC Managed Care – PPO | Source: Ambulatory Visit | Attending: Cardiology | Admitting: Cardiology

## 2012-12-05 ENCOUNTER — Ambulatory Visit (INDEPENDENT_AMBULATORY_CARE_PROVIDER_SITE_OTHER): Payer: Self-pay | Admitting: Thoracic Surgery (Cardiothoracic Vascular Surgery)

## 2012-12-05 ENCOUNTER — Other Ambulatory Visit: Payer: Self-pay | Admitting: *Deleted

## 2012-12-05 ENCOUNTER — Encounter: Payer: Self-pay | Admitting: Thoracic Surgery (Cardiothoracic Vascular Surgery)

## 2012-12-05 ENCOUNTER — Ambulatory Visit
Admission: RE | Admit: 2012-12-05 | Discharge: 2012-12-05 | Disposition: A | Discharge status: Home / Self Care | Payer: BC Managed Care – PPO | Source: Ambulatory Visit | Attending: Thoracic Surgery (Cardiothoracic Vascular Surgery) | Admitting: Thoracic Surgery (Cardiothoracic Vascular Surgery)

## 2012-12-05 VITALS — BP 135/85 | HR 78 | Resp 20 | Ht 70.0 in | Wt 212.0 lb

## 2012-12-05 DIAGNOSIS — I10 Essential (primary) hypertension: Secondary | ICD-10-CM | POA: Insufficient documentation

## 2012-12-05 DIAGNOSIS — I2510 Atherosclerotic heart disease of native coronary artery without angina pectoris: Secondary | ICD-10-CM

## 2012-12-05 DIAGNOSIS — Z9889 Other specified postprocedural states: Secondary | ICD-10-CM

## 2012-12-05 DIAGNOSIS — I359 Nonrheumatic aortic valve disorder, unspecified: Secondary | ICD-10-CM | POA: Insufficient documentation

## 2012-12-05 DIAGNOSIS — J90 Pleural effusion, not elsewhere classified: Secondary | ICD-10-CM | POA: Insufficient documentation

## 2012-12-05 DIAGNOSIS — Z5189 Encounter for other specified aftercare: Secondary | ICD-10-CM | POA: Insufficient documentation

## 2012-12-05 DIAGNOSIS — I059 Rheumatic mitral valve disease, unspecified: Secondary | ICD-10-CM | POA: Insufficient documentation

## 2012-12-05 NOTE — Patient Instructions (Signed)
The patient may continue to gradually increase their physical activity as tolerated.  They should refrain from any heavy lifting or strenuous use of their arms and shoulders until at least 8 weeks from the time of their surgery, and they should avoid activities that cause increased pain in their chest on the side of their surgical incision.  Otherwise they may continue to increase their activities without any particular limitations.  If the patient's chest CT scan looks good he may return to work

## 2012-12-05 NOTE — Progress Notes (Signed)
[address]       [name] [address]             [phone]     CARDIOTHORACIC SURGERY OFFICE NOTE  Referring Provider is [name], MD PCP is Tally Due, MD   HPI:  Patient returns for followup status post minimally invasive mitral valve repair on 09/27/2012.  His postoperative recovery has been relatively uncomplicated although he did develop atrial fibrillation and a large right pleural effusion.  He underwent needle thoracentesis for right oral effusion on 10/27/2012 and he was last seen here in our office on 10/31/2012.  He was also seen the following day by Dr. [name], at which time he was back in sinus rhythm.  Late followup echocardiogram looked good with intact mitral valve repair although the patient's left ventricular ejection fraction was notably moderately depressed at 35-40%. Since then the patient has continued to do very well. He reports no shortness of breath either with activity or at rest. He states that he is bored at home and he wants to go back to work. He has no significant pain at all in his chest. He is not having any tachypalpitations or dizzy spells.   Current Outpatient Prescriptions  Medication Sig Dispense Refill  . acetaminophen (TYLENOL) 325 MG tablet Take 650 mg by mouth as needed for pain.       Marland Kitchen amiodarone (PACERONE) 200 MG tablet Take 200 mg by mouth daily.      Marland Kitchen aspirin EC 81 MG tablet Take 1 tablet (81 mg total) by mouth daily.  30 tablet  1  . atorvastatin (LIPITOR) 20 MG tablet Take 1 tablet (20 mg total) by mouth daily.  90 tablet  3  . cetirizine (ZYRTEC) 10 MG tablet Take 10 mg by mouth daily.        . Cholecalciferol (VITAMIN D) 2000 UNITS CAPS Take 1 capsule by mouth daily.      . fluticasone (FLONASE) 50 MCG/ACT nasal spray Place 2 sprays into the nose daily.  48 g  11  . furosemide (LASIX) 40 MG tablet Take 1 tablet (40 mg total) by mouth daily.  30 tablet  11  . metoprolol succinate (TOPROL-XL) 50  MG 24 hr tablet 1 and 1/2 tablets (total 75mg ) daily. Take with or immediately following a meal.  135 tablet  3  . potassium chloride SA (K-DUR,KLOR-CON) 20 MEQ tablet Take 1 tablet (20 mEq total) by mouth daily.  30 tablet  11  . ramipril (ALTACE) 10 MG capsule Take 10 mg by mouth every morning.      . warfarin (COUMADIN) 5 MG tablet Take 1 tablet daily or as directed by [facility]  60 tablet  1   No current facility-administered medications for this visit.      Physical Exam:   BP 135/85  Pulse 78  Resp 20  Ht 5\' 10"  (1.778 m)  Wt 212 lb (96.163 kg)  BMI 30.42 kg/m2  SpO2 97%  General:  Well-appearing  Chest:   Clear to auscultation with mildly diminished breath sounds right lung base  CV:   Regular rate and rhythm without murmur  Incisions:  Well-healed. There is a mild erythematous rash along the right lateral chest wall below the surgical incision that is completely nontender.  There is no palpable hernia of the chest wall with cough.  Abdomen:  Soft and nontender  Extremities:  Warm and well-perfused with no lower extremity edema  Diagnostic Tests:  *RADIOLOGY REPORT*  Clinical Data: Cough, mitral valve repair on 09/27/2012, former  smoking history  CHEST - 2 VIEW  Comparison: Chest x-ray of 10/31/2012  Findings: A small right pleural effusion remains with mild right  basilar atelectasis. Cardiomegaly is stable. No pneumothorax is  seen. Mild pulmonary vascular congestion cannot be excluded.  IMPRESSION:  Little change in right pleural effusion and right basilar  atelectasis. Cannot exclude mild pulmonary vascular congestion.  Original Report Authenticated By: Dwyane Dee, M.D.    Impression:  The patient is doing very well approximately 2 months following minimally invasive mitral valve repair. He has a very small residual right pleural effusion that has remained stable since thoracentesis April 24. Patient does have a mild erythematous rash in the right lateral  chest wall of unclear significance. There is no obvious sign of infection.   Plan:  We will obtain CT scan of the chest to rule out fluid collection or other sign of infection in the lateral chest wall. As long as this looks okay think would be reasonable for the patient to return to work.  We will contact him if the chest CT scan reveals anything concerning, and otherwise will plan to see him back for routine followup in 3 months to review the results of followup echocardiogram.     Salvatore Decent. Cornelius Moras, MD 12/05/2012 6:37 PM

## 2012-12-07 ENCOUNTER — Ambulatory Visit: Payer: BC Managed Care – PPO | Admitting: Cardiology

## 2012-12-07 ENCOUNTER — Encounter (HOSPITAL_COMMUNITY)
Admission: RE | Admit: 2012-12-07 | Discharge: 2012-12-07 | Disposition: A | Discharge status: Home / Self Care | Payer: BC Managed Care – PPO | Source: Ambulatory Visit | Attending: Cardiology | Admitting: Cardiology

## 2012-12-07 ENCOUNTER — Ambulatory Visit
Admission: RE | Admit: 2012-12-07 | Discharge: 2012-12-07 | Disposition: A | Discharge status: Home / Self Care | Payer: BC Managed Care – PPO | Source: Ambulatory Visit | Attending: Thoracic Surgery (Cardiothoracic Vascular Surgery) | Admitting: Thoracic Surgery (Cardiothoracic Vascular Surgery)

## 2012-12-07 DIAGNOSIS — J90 Pleural effusion, not elsewhere classified: Secondary | ICD-10-CM

## 2012-12-07 MED ORDER — IOHEXOL 300 MG/ML  SOLN
75.0000 mL | Freq: Once | INTRAMUSCULAR | Status: AC | PRN
  Administered 2012-12-07: 75 mL via INTRAVENOUS

## 2012-12-08 ENCOUNTER — Other Ambulatory Visit: Payer: Self-pay

## 2012-12-08 DIAGNOSIS — J90 Pleural effusion, not elsewhere classified: Secondary | ICD-10-CM

## 2012-12-09 ENCOUNTER — Encounter (HOSPITAL_COMMUNITY)
Admission: RE | Admit: 2012-12-09 | Discharge: 2012-12-09 | Disposition: A | Discharge status: Home / Self Care | Payer: BC Managed Care – PPO | Source: Ambulatory Visit | Attending: Cardiology | Admitting: Cardiology

## 2012-12-09 NOTE — Progress Notes (Signed)
Pt completed 8 exercise sessions and elected to discharge his participation due to returning back to work on Monday.  Medication list reconciled. Pt will have guided ultrasound thoracentesis on Monday due to fluid noted on his chest CT.  Pt concerned because he will have to ask his employer on his first day back to work to be excused early for the procedure.  Repeat PHQ2 score remained 0.

## 2012-12-12 ENCOUNTER — Other Ambulatory Visit: Payer: Self-pay | Admitting: *Deleted

## 2012-12-12 ENCOUNTER — Ambulatory Visit (HOSPITAL_COMMUNITY)
Admission: RE | Admit: 2012-12-12 | Discharge: 2012-12-12 | Disposition: A | Discharge status: Home / Self Care | Payer: BC Managed Care – PPO | Source: Ambulatory Visit | Attending: Thoracic Surgery (Cardiothoracic Vascular Surgery) | Admitting: Thoracic Surgery (Cardiothoracic Vascular Surgery)

## 2012-12-12 ENCOUNTER — Other Ambulatory Visit: Payer: Self-pay

## 2012-12-12 ENCOUNTER — Other Ambulatory Visit (HOSPITAL_COMMUNITY): Payer: Self-pay

## 2012-12-12 ENCOUNTER — Encounter (HOSPITAL_COMMUNITY): Payer: BC Managed Care – PPO

## 2012-12-12 ENCOUNTER — Ambulatory Visit (HOSPITAL_COMMUNITY)
Admission: RE | Admit: 2012-12-12 | Discharge: 2012-12-12 | Disposition: A | Discharge status: Home / Self Care | Payer: BC Managed Care – PPO | Source: Ambulatory Visit | Attending: Radiology | Admitting: Radiology

## 2012-12-12 DIAGNOSIS — J90 Pleural effusion, not elsewhere classified: Secondary | ICD-10-CM

## 2012-12-12 NOTE — Procedures (Signed)
Rt pleural effusion  Rt thora US guided  300 cc blood tinged fluid Sent for labs per MD  Pt tolerated well cxr pending

## 2012-12-14 ENCOUNTER — Ambulatory Visit (INDEPENDENT_AMBULATORY_CARE_PROVIDER_SITE_OTHER): Payer: BC Managed Care – PPO | Admitting: Pharmacist

## 2012-12-14 ENCOUNTER — Encounter: Payer: Self-pay | Admitting: Cardiology

## 2012-12-14 ENCOUNTER — Ambulatory Visit (INDEPENDENT_AMBULATORY_CARE_PROVIDER_SITE_OTHER): Payer: BC Managed Care – PPO | Admitting: Cardiology

## 2012-12-14 ENCOUNTER — Encounter (HOSPITAL_COMMUNITY): Payer: BC Managed Care – PPO

## 2012-12-14 VITALS — BP 138/88 | HR 67 | Ht 70.0 in | Wt 215.0 lb

## 2012-12-14 DIAGNOSIS — I25810 Atherosclerosis of coronary artery bypass graft(s) without angina pectoris: Secondary | ICD-10-CM

## 2012-12-14 DIAGNOSIS — Z9889 Other specified postprocedural states: Secondary | ICD-10-CM

## 2012-12-14 DIAGNOSIS — I4891 Unspecified atrial fibrillation: Secondary | ICD-10-CM

## 2012-12-14 DIAGNOSIS — I2510 Atherosclerotic heart disease of native coronary artery without angina pectoris: Secondary | ICD-10-CM

## 2012-12-14 DIAGNOSIS — E785 Hyperlipidemia, unspecified: Secondary | ICD-10-CM

## 2012-12-14 DIAGNOSIS — Z7901 Long term (current) use of anticoagulants: Secondary | ICD-10-CM

## 2012-12-14 DIAGNOSIS — I059 Rheumatic mitral valve disease, unspecified: Secondary | ICD-10-CM

## 2012-12-14 DIAGNOSIS — I340 Nonrheumatic mitral (valve) insufficiency: Secondary | ICD-10-CM

## 2012-12-14 DIAGNOSIS — I5022 Chronic systolic (congestive) heart failure: Secondary | ICD-10-CM

## 2012-12-14 DIAGNOSIS — J90 Pleural effusion, not elsewhere classified: Secondary | ICD-10-CM

## 2012-12-14 DIAGNOSIS — I509 Heart failure, unspecified: Secondary | ICD-10-CM

## 2012-12-14 LAB — BASIC METABOLIC PANEL
BUN: 17 mg/dL (ref 6–23)
CO2: 33 mEq/L — ABNORMAL HIGH (ref 19–32)
Calcium: 9 mg/dL (ref 8.4–10.5)
Chloride: 104 mEq/L (ref 96–112)
Creatinine, Ser: 1.1 mg/dL (ref 0.4–1.5)
GFR: 68.89 mL/min (ref >60.00)
Glucose, Bld: 84 mg/dL (ref 70–99)
Potassium: 4.5 mEq/L (ref 3.5–5.1)
Sodium: 140 mEq/L (ref 135–145)

## 2012-12-14 LAB — POCT INR: INR: 1.4

## 2012-12-14 MED ORDER — POTASSIUM CHLORIDE ER 10 MEQ PO TBCR: 10.0000 meq | EXTENDED_RELEASE_TABLET | Freq: Every day | ORAL | Status: DC

## 2012-12-14 MED ORDER — METOPROLOL SUCCINATE ER 100 MG PO TB24: 100.0000 mg | ORAL_TABLET | Freq: Every day | ORAL | Status: DC

## 2012-12-14 MED ORDER — FUROSEMIDE 20 MG PO TABS: 20.0000 mg | ORAL_TABLET | Freq: Every day | ORAL | Status: DC

## 2012-12-14 MED ORDER — APIXABAN 5 MG PO TABS: 5.0000 mg | ORAL_TABLET | Freq: Two times a day (BID) | ORAL | Status: DC

## 2012-12-14 NOTE — Patient Instructions (Addendum)
Stop aspirin.  Stop amiodarone.  Stop coumadin.  Start Eliquis 5mg  two times a day.   Increase Toprol XL(metoprolol succinate) to 100mg  daily.   Decrease lasix to 20mg  daily.   Decrease KCL(potassium) to 10 mEq daily.  Your physician recommends that you have lab work today--BMET.  Your physician recommends that you schedule a follow-up appointment in: CVRR 1 month after you start Eliquis.     Your physician has requested that you have an echocardiogram. Echocardiography is a painless test that uses sound waves to create images of your heart. It provides your doctor with information about the size and shape of your heart and how well your heart's chambers and valves are working. This procedure takes approximately one hour. There are no restrictions for this procedure. This is scheduled for July 25,2014 at [facility].  Your physician recommends that you schedule a follow-up appointment in: 3 months with Dr [name].

## 2012-12-15 LAB — BODY FLUID CULTURE
Culture: NO GROWTH
Gram Stain: NONE SEEN

## 2012-12-15 NOTE — Progress Notes (Signed)
Patient ID: Christopher [name], male   DOB: 11/24/1950, 62 y.o.   MRN: [id number] PCP: Dr. Perrin Maltese  62 yo with history of mitral valve prolapse and prior mitral valve endocarditis with severe mitral regurgitation presents for cardiology followup. He had a TEE in 7/13 that showed EF 55-60% with partial flail posterior leaflet and at least moderate but probably severe eccentric anteriorly directed MR.  The LV was upper normal in size.  Patient had noted the onset of exertional dyspnea.  No chest pain.  I took him for TEE, which showed flail P2 and P1 segments of the mitral valve with a ruptured chord.  There was severe MR.  LHC showed 95% mRCA stenosis.  As he wanted a minimally invasive mitral valve repair, arranged for PCI to the mid RCA, which was done using BMS.  After 1 month of Plavix, he had a minimally-invasive mitral valve repair with uncomplicated hospital course.  He was started on coumadin for a few months post-operatively.  Initially, he did well and was walking for 30 minutes a day.  However,  prior to the last appointment, he noted dyspnea with walking about 50 feet or walking up steps.  Lasix was started.  CXR showed a new right pleural effusion and he has now had 2 thoracenteses. He was noted to be in atrial fibrillation as well at a prior appointment. He went back into NSR on amiodarone.  Post-op echo showed that the mitral valve looked good but EF was 35-40% with global hypokinesis.   Patient is doing well today.  He is back at work.  His breathing is better than it was pre-op.  No exertional dyspnea or chest pain.  He remains in NSR.  He went to cardiac rehab for 3 wks but is now exercising on his own.   ECG: NSR, RBBB, LAFB, 1st degree AV block.   Labs (7/12): LDL 128 Labs (7/13): K 3.9, creatinine 0.9, HDL 54, LDL 133 Labs (4/14): K 4.2, creatinine 1.2, TSH normal, LFTs normal, BNP 132 Labs (5/14): LDL 57, HDL 33  PMH: 1. HTN 2. H/o sinusitis 3. Nephrolithiasis 4. Mitral valve disease:  Baseline mitral valve prolapse.  Had MV endocarditis (Strep viridans) in 4/09.  Has had moderate to severe mitral regurgitation.  Echo (7/12): EF 60%, upper normal LV size (EDD 55 mm, ESD 35 mm), moderate (grade II) diastolic dysfunction with E/medial e' < 15, moderate to possibly severe eccentric MR with mitral valve prolapse and partial flail posterior leaflet, mild LAE. Echo (7/13): EF 55-60%, upper normal LV size, grade II diastolic dysfunction with E/e' < 15, IVC normal, flail segment of posterior leaflet with at least moderate and probably severe anteriorly directed MR, normal RV, unable to measure PA systolic pressure.  TEE (2/14) with EF 55-60%, flail P2 and P1 segments of the posterior leaflet with a ruptured chord, eccentric severe MR.  Status post minimally-invasive mitral valve repair in 3/14.  5. CAD: LHC (2/14) showed 95% mRCA stenosis.  This was treated with BMS.  6. Atrial fibrillation: First noted in 4/14.  7. Cardiomyopathy: Echo (4/14) with EF 35-40%, diffuse hypokinesis, s/p mitral valve repair with no mitral regurgitation.  8. Right-sided pleural effusion post-MV surgery  SH: Works at [employer] on [employer].  Divorced.  Occasional ETOH.  No smoking.   FH: Father with CAD, brother with MI at 77.    ROS: All systems reviewed and negative except as per HPI.   Current Outpatient Prescriptions  Medication Sig Dispense Refill  .  acetaminophen (TYLENOL) 325 MG tablet Take 650 mg by mouth as needed for pain.       Marland Kitchen atorvastatin (LIPITOR) 20 MG tablet Take 1 tablet (20 mg total) by mouth daily.  90 tablet  3  . cetirizine (ZYRTEC) 10 MG tablet Take 10 mg by mouth daily.        . Cholecalciferol (VITAMIN D) 2000 UNITS CAPS Take 1 capsule by mouth daily.      . fluticasone (FLONASE) 50 MCG/ACT nasal spray Place 2 sprays into the nose daily.  48 g  11  . ramipril (ALTACE) 10 MG capsule Take 10 mg by mouth every morning.      Marland Kitchen apixaban (ELIQUIS) 5 MG TABS tablet Take 1 tablet (5 mg  total) by mouth 2 (two) times daily.  60 tablet  3  . furosemide (LASIX) 20 MG tablet Take 1 tablet (20 mg total) by mouth daily.  30 tablet  6  . metoprolol succinate (TOPROL-XL) 100 MG 24 hr tablet Take 1 tablet (100 mg total) by mouth daily. Take with or immediately following a meal.  30 tablet  6  . potassium chloride (K-DUR) 10 MEQ tablet Take 1 tablet (10 mEq total) by mouth daily.  30 tablet  6   No current facility-administered medications for this visit.    BP 138/88  Pulse 67  Ht 5\' 10"  (1.778 m)  Wt 215 lb (97.523 kg)  BMI 30.85 kg/m2 General: NAD Neck: No JVD, no thyromegaly or thyroid nodule.  Lungs: Slight decreased breath sounds right base. CV: Nondisplaced PMI.  Heart regular S1/S2, no S3/S4, no significant murmur.  No peripheral edema.  No carotid bruit.  Normal pedal pulses.  Abdomen: Soft, nontender, no hepatosplenomegaly, no distention.  Neurologic: Alert and oriented x 3.  Psych: Normal affect. Extremities: No clubbing or cyanosis.   Assessment/Plan:  Mitral valve regurgitation  Severe eccentric MR in the setting of MV prolapse and history of MV endocarditis, no s/p minimally invasive MV repair.  The valve looked good on post-op echo.   Atrial fibrillation He has remained in NSR.  I will stop amiodarone today.  He will increase Toprol XL to 100 mg daily.  I am also going to have him stop warfarin and ASA and start apixaban 5 mg bid.   CAD Status post PCI to [facility] with BMS.  Stable, no significant ischemic chest pain.  Hyperlipidemia Statin begun with CAD.  Good lipids in 5/14.  Cardiomyopathy EF 35-40% with global hypokinesis on post-op echo.  It is possible that this could be tachycardia-mediated and related to atrial fibrillation.   - Continue ramipril, increase Toprol XL as above.  - Echo in 7/14 to see if EF improves with NSR and medical treatment.  - He is euvolemic on exam.  He can decrease Lasix to 20 mg daily and KCl to 10 mEq daily.   [name] 12/15/2012

## 2012-12-16 ENCOUNTER — Encounter (HOSPITAL_COMMUNITY): Payer: BC Managed Care – PPO

## 2012-12-19 ENCOUNTER — Encounter (HOSPITAL_COMMUNITY): Payer: BC Managed Care – PPO

## 2012-12-21 ENCOUNTER — Encounter (HOSPITAL_COMMUNITY): Payer: BC Managed Care – PPO

## 2012-12-23 ENCOUNTER — Encounter (HOSPITAL_COMMUNITY): Payer: BC Managed Care – PPO

## 2012-12-26 ENCOUNTER — Encounter (HOSPITAL_COMMUNITY): Payer: BC Managed Care – PPO

## 2012-12-27 ENCOUNTER — Ambulatory Visit: Payer: BC Managed Care – PPO | Admitting: Cardiology

## 2012-12-27 ENCOUNTER — Other Ambulatory Visit: Payer: Self-pay | Admitting: Cardiology

## 2012-12-28 ENCOUNTER — Encounter (HOSPITAL_COMMUNITY): Payer: BC Managed Care – PPO

## 2012-12-30 ENCOUNTER — Encounter (HOSPITAL_COMMUNITY): Payer: BC Managed Care – PPO

## 2013-01-02 ENCOUNTER — Encounter (HOSPITAL_COMMUNITY): Payer: BC Managed Care – PPO

## 2013-01-04 ENCOUNTER — Encounter (HOSPITAL_COMMUNITY): Payer: BC Managed Care – PPO

## 2013-01-06 ENCOUNTER — Encounter (HOSPITAL_COMMUNITY): Payer: BC Managed Care – PPO

## 2013-01-09 ENCOUNTER — Encounter (HOSPITAL_COMMUNITY): Payer: BC Managed Care – PPO

## 2013-01-11 ENCOUNTER — Encounter (HOSPITAL_COMMUNITY): Payer: BC Managed Care – PPO

## 2013-01-13 ENCOUNTER — Encounter (HOSPITAL_COMMUNITY): Payer: BC Managed Care – PPO

## 2013-01-16 ENCOUNTER — Encounter (HOSPITAL_COMMUNITY): Payer: BC Managed Care – PPO

## 2013-01-16 ENCOUNTER — Encounter: Payer: Self-pay | Admitting: Cardiology

## 2013-01-18 ENCOUNTER — Encounter (HOSPITAL_COMMUNITY): Payer: BC Managed Care – PPO

## 2013-01-18 ENCOUNTER — Telehealth: Payer: Self-pay | Admitting: *Deleted

## 2013-01-18 NOTE — Telephone Encounter (Signed)
     [name], MD     Sent: Tue January 17, 2013  8:29 PM    To: Jacqlyn Krauss, RN       Christopher [name]    MRN: [id number] DOB: May 07, 1951    Pt Work: [phone] Pt Home: [phone]           Message    Thurston Hole,     I got an email from Raquel Rolan about headaches on Eliquis.  He is going to stop Eliquis and start ASA 81 mg daily.  He needs to call me if the headaches do not resolve.  If headaches resolve, I would want him to start on Xarelto 20 mg daily.     [place]

## 2013-01-20 ENCOUNTER — Encounter (HOSPITAL_COMMUNITY): Payer: BC Managed Care – PPO

## 2013-01-23 ENCOUNTER — Encounter (HOSPITAL_COMMUNITY): Payer: BC Managed Care – PPO

## 2013-01-25 ENCOUNTER — Encounter (HOSPITAL_COMMUNITY): Payer: BC Managed Care – PPO

## 2013-01-26 MED ORDER — RIVAROXABAN 20 MG PO TABS: 20.0000 mg | ORAL_TABLET | Freq: Every day | ORAL | Status: DC

## 2013-01-27 ENCOUNTER — Encounter (HOSPITAL_COMMUNITY): Payer: BC Managed Care – PPO

## 2013-01-27 ENCOUNTER — Other Ambulatory Visit (HOSPITAL_COMMUNITY): Payer: BC Managed Care – PPO

## 2013-01-27 ENCOUNTER — Ambulatory Visit (HOSPITAL_COMMUNITY): Payer: BC Managed Care – PPO | Attending: Cardiology | Admitting: Radiology

## 2013-01-27 DIAGNOSIS — I4891 Unspecified atrial fibrillation: Secondary | ICD-10-CM | POA: Insufficient documentation

## 2013-01-27 DIAGNOSIS — I079 Rheumatic tricuspid valve disease, unspecified: Secondary | ICD-10-CM | POA: Insufficient documentation

## 2013-01-27 DIAGNOSIS — I509 Heart failure, unspecified: Secondary | ICD-10-CM | POA: Insufficient documentation

## 2013-01-27 DIAGNOSIS — E785 Hyperlipidemia, unspecified: Secondary | ICD-10-CM | POA: Insufficient documentation

## 2013-01-27 DIAGNOSIS — I10 Essential (primary) hypertension: Secondary | ICD-10-CM | POA: Insufficient documentation

## 2013-01-27 DIAGNOSIS — E669 Obesity, unspecified: Secondary | ICD-10-CM | POA: Insufficient documentation

## 2013-01-27 DIAGNOSIS — I2510 Atherosclerotic heart disease of native coronary artery without angina pectoris: Secondary | ICD-10-CM | POA: Insufficient documentation

## 2013-01-27 DIAGNOSIS — I379 Nonrheumatic pulmonary valve disorder, unspecified: Secondary | ICD-10-CM | POA: Insufficient documentation

## 2013-01-27 DIAGNOSIS — I5022 Chronic systolic (congestive) heart failure: Secondary | ICD-10-CM

## 2013-01-27 DIAGNOSIS — I080 Rheumatic disorders of both mitral and aortic valves: Secondary | ICD-10-CM | POA: Insufficient documentation

## 2013-01-27 DIAGNOSIS — I059 Rheumatic mitral valve disease, unspecified: Secondary | ICD-10-CM

## 2013-01-27 DIAGNOSIS — Z9889 Other specified postprocedural states: Secondary | ICD-10-CM | POA: Insufficient documentation

## 2013-01-27 NOTE — Progress Notes (Signed)
Echocardiogram performed.  

## 2013-01-30 ENCOUNTER — Telehealth: Payer: Self-pay | Admitting: Cardiology

## 2013-01-30 ENCOUNTER — Encounter (HOSPITAL_COMMUNITY): Payer: BC Managed Care – PPO

## 2013-01-30 NOTE — Telephone Encounter (Signed)
New problem ° ° ° °Pt returning your phone call. Please call pt °

## 2013-01-30 NOTE — Telephone Encounter (Signed)
Spoke with pt. Pt has stopped aspirin and has started Xarelto.

## 2013-02-01 ENCOUNTER — Encounter (HOSPITAL_COMMUNITY): Payer: BC Managed Care – PPO

## 2013-02-03 ENCOUNTER — Encounter (HOSPITAL_COMMUNITY): Payer: BC Managed Care – PPO

## 2013-02-06 ENCOUNTER — Encounter (HOSPITAL_COMMUNITY): Payer: BC Managed Care – PPO

## 2013-02-08 ENCOUNTER — Encounter (HOSPITAL_COMMUNITY): Payer: BC Managed Care – PPO

## 2013-02-10 ENCOUNTER — Encounter (HOSPITAL_COMMUNITY): Payer: BC Managed Care – PPO

## 2013-02-13 ENCOUNTER — Encounter (HOSPITAL_COMMUNITY): Payer: BC Managed Care – PPO

## 2013-02-15 ENCOUNTER — Encounter (HOSPITAL_COMMUNITY): Payer: BC Managed Care – PPO

## 2013-02-17 ENCOUNTER — Encounter (HOSPITAL_COMMUNITY): Payer: BC Managed Care – PPO

## 2013-02-20 ENCOUNTER — Encounter (HOSPITAL_COMMUNITY): Payer: BC Managed Care – PPO

## 2013-02-21 ENCOUNTER — Ambulatory Visit (INDEPENDENT_AMBULATORY_CARE_PROVIDER_SITE_OTHER): Payer: BC Managed Care – PPO | Admitting: Physician Assistant

## 2013-02-21 DIAGNOSIS — H6592 Unspecified nonsuppurative otitis media, left ear: Secondary | ICD-10-CM

## 2013-02-21 DIAGNOSIS — H6590 Unspecified nonsuppurative otitis media, unspecified ear: Secondary | ICD-10-CM

## 2013-02-21 DIAGNOSIS — H6980 Other specified disorders of Eustachian tube, unspecified ear: Secondary | ICD-10-CM

## 2013-02-21 DIAGNOSIS — H6982 Other specified disorders of Eustachian tube, left ear: Secondary | ICD-10-CM

## 2013-02-21 MED ORDER — PREDNISONE 20 MG PO TABS: ORAL_TABLET | ORAL | Status: DC

## 2013-02-21 NOTE — Patient Instructions (Addendum)
Take the prednisone (steroid medicine) as directed.  Continue your daily Flonase and Zyrtec.  If you are not seeing improvement in symptoms, please let us know and we can send you to Dr. [name]

## 2013-02-21 NOTE — Progress Notes (Signed)
  Subjective:    Patient ID: Christopher [name], male    DOB: 1951-01-04, 62 y.o.   MRN: [id number]  HPI   Christopher [name] is a pleasant 62 yr old male here with complaint of left ear begin "stopped up."  Reports it has been like this for 2 weeks no.  No pain, but +decreased hearing on the left.  Right ear feels ok.  He denies URI symptoms, popping, wax build up.  He not been flying or in the mountains.  States this just started out of the blue one day.  Reports problems with this ear in the past - medicine here seemed to relieve the problem.  (Review of paper chart indicates several episodes of Left ETD treated with PO steroids.)  He has never seen ENT.  He currently takes daily Flonase and Zyrtec.  Reports that he can hear himself talk in the left ear - "feeles like I'm in a barrel", "echoey."  No fevers or chills.  No tinnitus.  No dizziness.   Review of Systems  Constitutional: Negative for fever and chills.  HENT: Positive for hearing loss. Negative for ear pain, congestion, rhinorrhea, tinnitus and ear discharge.   Respiratory: Negative.   Cardiovascular: Negative.   Gastrointestinal: Negative.   Musculoskeletal: Negative.   Skin: Negative.   Neurological: Negative.        Objective:   Physical Exam  Vitals reviewed. Constitutional: He is oriented to person, place, and time. He appears well-developed and well-nourished. No distress.  HENT:  Head: Normocephalic and atraumatic.  Right Ear: Tympanic membrane, external ear and ear canal normal.  Left Ear: No drainage or tenderness. No foreign bodies. Tympanic membrane is retracted. A middle ear effusion is present. Decreased hearing is noted.  Modified Weber (pt humming) lateralizes to the Left indicating conductive rather than SN HL  Eyes: Conjunctivae are normal. No scleral icterus.  Cardiovascular: Normal rate, regular rhythm and normal heart sounds.   Pulmonary/Chest: Effort normal and breath sounds normal. He has no wheezes. He has no  rales.  Neurological: He is alert and oriented to person, place, and time.  Skin: Skin is warm and dry.  Psychiatric: He has a normal mood and affect. His behavior is normal.         Assessment & Plan:  ETD (eustachian tube dysfunction), left - Plan: predniSONE (DELTASONE) 20 MG tablet  Middle ear effusion, left - Plan: predniSONE (DELTASONE) 20 MG tablet   Christopher [name] is a 62 yr old male with 2 weeks of decreased hearing in the LEFT ear.  On exam the TM is retracted an effusion is present.  He has no pain and no URI symptoms.  When pt hums (simulating Weber test), sound lateralizes to the left - which is reassuring that this a conductive rather than sensorineural hearing loss.  Chart review indicates pt has had multiple episodes of this in the past with good response to PO steroids.  Will try prednisone 40mg  x 3 days, 20mg  x 3 days.  Continue daily Flonase and Zyrtec.  If sxs not improving, would refer to ENT for further eval.  Pt prefers to see Dr. [name].

## 2013-02-22 ENCOUNTER — Encounter (HOSPITAL_COMMUNITY): Payer: BC Managed Care – PPO

## 2013-02-24 ENCOUNTER — Encounter (HOSPITAL_COMMUNITY): Payer: BC Managed Care – PPO

## 2013-02-24 ENCOUNTER — Ambulatory Visit (INDEPENDENT_AMBULATORY_CARE_PROVIDER_SITE_OTHER): Payer: BC Managed Care – PPO | Admitting: Emergency Medicine

## 2013-02-24 VITALS — BP 142/78 | HR 72 | Temp 100.3°F | Resp 18 | Ht 70.0 in | Wt 222.8 lb

## 2013-02-24 DIAGNOSIS — M5430 Sciatica, unspecified side: Secondary | ICD-10-CM

## 2013-02-24 DIAGNOSIS — M5431 Sciatica, right side: Secondary | ICD-10-CM

## 2013-02-24 DIAGNOSIS — H66009 Acute suppurative otitis media without spontaneous rupture of ear drum, unspecified ear: Secondary | ICD-10-CM

## 2013-02-24 MED ORDER — NAPROXEN SODIUM 550 MG PO TABS: 550.0000 mg | ORAL_TABLET | Freq: Two times a day (BID) | ORAL | Status: DC

## 2013-02-24 MED ORDER — PSEUDOEPHEDRINE-GUAIFENESIN ER 60-600 MG PO TB12: 1.0000 | ORAL_TABLET | Freq: Two times a day (BID) | ORAL | Status: DC

## 2013-02-24 MED ORDER — HYDROCODONE-ACETAMINOPHEN 5-325 MG PO TABS: 1.0000 | ORAL_TABLET | ORAL | Status: DC | PRN

## 2013-02-24 MED ORDER — AMOXICILLIN-POT CLAVULANATE 875-125 MG PO TABS: 1.0000 | ORAL_TABLET | Freq: Two times a day (BID) | ORAL | Status: DC

## 2013-02-24 NOTE — Progress Notes (Signed)
[facility] [address] [place] [address] [phone]  Date:  02/24/2013   Name:  Christopher [name]   DOB:  [birth year]   MRN:  [id number]  PCP:  Tally Due, MD    Chief Complaint: Tailbone Pain and Fever   History of Present Illness:  Christopher [name] is a 62 y.o. very pleasant male patient who presents with the following:  Two problems today.  Seen Tuesday for pain in left ear associated with a eustachian tube dysfunction.  Treated with prednisone with no amelioration of symptoms.  Nos has pain in left ear and a fever less than 102.  No cough.  Moderate nasal congestion.  Has some mucopurulent discharge.  No cough or shortness of breath.  No improvement with over the counter medications or other home remedies.  Has sudden right hip started yesterday with no provocative event.  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  Radiates into the proximal thigh.  Now has pain sitting and standing.  Relief of pain with laying down.  Worse when bends or stands.  No history of prior back injury. No neuro symptoms. No weakness or numbness.  No improvement with over the counter medications or other home remedies.  Denies other complaint or health concern today.   Patient Active Problem List   Diagnosis Date Noted  . Pleural effusion on right 12/05/2012  . Chronic systolic CHF (congestive heart failure) 10/25/2012  . Atrial fibrillation 10/25/2012  . S/P MVR (mitral valve repair) 10/10/2012  . Long term (current) use of anticoagulants 10/04/2012  . S/P mitral valve repair 09/27/2012  . Hyperlipidemia 09/04/2012  . Unstable angina 08/25/2012  . Severe mitral regurgitation   . Coronary artery disease 08/12/2012  . Mitral valve regurgitation 01/09/2011  . HYPERTENSION 09/27/2008  . ALLERGIC RHINITIS 09/27/2008  . SLEEP APNEA 09/27/2008    Past Medical History  Diagnosis Date  . Hypertension   . Endocarditis 2009     Strep viridans  . Rhinitis, allergic   . Severe mitral regurgitation     a. Mitral valve prolapse with flail segment of posterior leaflet and severe MR by TEE, remote h/o bacterial endocarditis   . Coronary artery disease     a. 2/7 Cath: LM nl, LAD min irregs, LCX min irregs, RI 40, RCA 41m, EF 55-60% basal to mid inf HK, 3-4+ MR;  b. 08/25/2012 PCI of RCA with 4.0x15 Vision BMS  . 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 Sleep apnea     NPSG 01/21/06- AHI 40.7/hr cpap  . Heart murmur   . GERD (gastroesophageal reflux disease)     hx  . Arthritis   . S/P mitral valve repair 09/27/2012    Complex valvuloplasty including triangular resection of flail posterior leaflet with 30 mm Sorin Memo 3D ring annuloplasty via right mini thoracotomy approach    Past Surgical History  Procedure Laterality Date  . Septoplasty      Dr. [name]  . Uvulopalatopharyngoplasty    . Tonsillectomy    . Foot surgery      BILATERAL TOES  . Amputation  07/28/2012    Procedure: AMPUTATION DIGIT;  Surgeon: [name], MD;  Location: [facility] ORS;  Service: Orthopedics;  Laterality: Right;  2nd toe  . Tee without cardioversion N/A 08/12/2012    Procedure: TRANSESOPHAGEAL ECHOCARDIOGRAM (TEE);  Surgeon: [name], MD;  Location:  [facility] ENDOSCOPY;  Service: Cardiovascular;  Laterality: N/A;  . Cardiac catheterization    . Mitral valve repair Right 09/27/2012    Procedure: MINIMALLY INVASIVE MITRAL VALVE REPAIR (MVR);  Surgeon: [name], MD;  Location: [facility] OR;  Service: Open Heart Surgery;  Laterality: Right;  Ultrasound guided  . Intraoperative transesophageal echocardiogram N/A 09/27/2012    Procedure: INTRAOPERATIVE TRANSESOPHAGEAL ECHOCARDIOGRAM;  Surgeon: [name], MD;  Location: [facility] OR;  Service: Open Heart Surgery;  Laterality: N/A;    History  Substance Use Topics  . Smoking status: Former Smoker -- 2.50 packs/day for 5 years    Types: Cigarettes    Quit date: 07/06/1978   . Smokeless tobacco: Not on file     Comment: social drinker  . Alcohol Use: Yes     Comment: OCC.    Family History  Problem Relation Age of Onset  . Heart disease Father   . Cancer Father   . Diabetes Father   . Diabetes Mother   . Stroke Mother     Allergies  Allergen Reactions  . Codeine     Causes bad constipation  . Dust Mite Extract   . Mold Extract [Trichophyton]   . Pollen Extract     Medication list has been reviewed and updated.  Current Outpatient Prescriptions on File Prior to Visit  Medication Sig Dispense Refill  . acetaminophen (TYLENOL) 325 MG tablet Take 650 mg by mouth as needed for pain.       Marland Kitchen atorvastatin (LIPITOR) 20 MG tablet Take 1 tablet (20 mg total) by mouth daily.  90 tablet  3  . cetirizine (ZYRTEC) 10 MG tablet Take 10 mg by mouth daily.        . Cholecalciferol (VITAMIN D) 2000 UNITS CAPS Take 1 capsule by mouth daily.      . fluticasone (FLONASE) 50 MCG/ACT nasal spray Place 2 sprays into the nose daily.  48 g  11  . furosemide (LASIX) 20 MG tablet Take 1 tablet (20 mg total) by mouth daily.  30 tablet  6  . metoprolol succinate (TOPROL-XL) 100 MG 24 hr tablet Take 1 tablet (100 mg total) by mouth daily. Take with or immediately following a meal.  30 tablet  6  . potassium chloride (K-DUR) 10 MEQ tablet Take 1 tablet (10 mEq total) by mouth daily.  30 tablet  6  . predniSONE (DELTASONE) 20 MG tablet Take 40mg  (2 tab) by mouth for 3 days, then take 20mg  (1 tab) by mouth for 3 days.  9 tablet  0  . ramipril (ALTACE) 10 MG capsule Take 10 mg by mouth every morning.      . Rivaroxaban (XARELTO) 20 MG TABS Take 1 tablet (20 mg total) by mouth daily with supper.  30 tablet  3  . aspirin EC 81 MG tablet Take 1 tablet (81 mg total) by mouth daily.  150 tablet  2   No current facility-administered medications on file prior to visit.    Review of Systems:  As per HPI, otherwise negative.    Physical Examination: Filed Vitals:   02/24/13  1144  BP: 142/78  Pulse: 72  Temp: 100.3 F (37.9 C)  Resp: 18   Filed Vitals:   02/24/13 1144  Height: 5\' 10"  (1.778 m)  Weight: 222 lb 12.8 oz (101.061 kg)   Body mass index is 31.97 kg/(m^2). Ideal Body Weight: Weight in (lb) to have BMI = 25: 173.9  GEN: WDWN, NAD, Non-toxic, A &  O x 3 HEENT: Atraumatic, Normocephalic. Neck supple. No masses, No LAD. Ears and Nose: No external deformity.  Left drum dramatically retracted.  Dull and red CV: RRR, No M/G/R. No JVD. No thrill. No extra heart sounds. PULM: CTA B, no wheezes, crackles, rhonchi. No retractions. No resp. distress. No accessory muscle use. ABD: S, NT, ND, +BS. No rebound. No HSM. EXTR: No c/c/e.  Tender right sciatic notch.  Full ROM right hip.  Hip not tender. NEURO antalgic gait.  PSYCH: Normally interactive. Conversant. Not depressed or anxious appearing.  Calm demeanor.    Assessment and Plan: Otitis media Sciatic neuritis Hydrocodone mucinex d Stop prednisone augmentin Anaprox  Signed,  Phillips Odor, MD

## 2013-02-24 NOTE — Patient Instructions (Signed)

## 2013-03-02 ENCOUNTER — Emergency Department (HOSPITAL_COMMUNITY)
Admission: EM | Admit: 2013-03-02 | Discharge: 2013-03-02 | Disposition: A | Discharge status: Home / Self Care | Payer: BC Managed Care – PPO | Attending: Emergency Medicine | Admitting: Emergency Medicine

## 2013-03-02 ENCOUNTER — Encounter (HOSPITAL_COMMUNITY): Payer: Self-pay | Admitting: *Deleted

## 2013-03-02 DIAGNOSIS — I2510 Atherosclerotic heart disease of native coronary artery without angina pectoris: Secondary | ICD-10-CM | POA: Insufficient documentation

## 2013-03-02 DIAGNOSIS — Z9889 Other specified postprocedural states: Secondary | ICD-10-CM | POA: Insufficient documentation

## 2013-03-02 DIAGNOSIS — Z7902 Long term (current) use of antithrombotics/antiplatelets: Secondary | ICD-10-CM | POA: Insufficient documentation

## 2013-03-02 DIAGNOSIS — I10 Essential (primary) hypertension: Secondary | ICD-10-CM | POA: Insufficient documentation

## 2013-03-02 DIAGNOSIS — IMO0001 Reserved for inherently not codable concepts without codable children: Secondary | ICD-10-CM | POA: Insufficient documentation

## 2013-03-02 DIAGNOSIS — Z87891 Personal history of nicotine dependence: Secondary | ICD-10-CM | POA: Insufficient documentation

## 2013-03-02 DIAGNOSIS — J309 Allergic rhinitis, unspecified: Secondary | ICD-10-CM | POA: Insufficient documentation

## 2013-03-02 DIAGNOSIS — Z885 Allergy status to narcotic agent status: Secondary | ICD-10-CM | POA: Insufficient documentation

## 2013-03-02 DIAGNOSIS — Z79899 Other long term (current) drug therapy: Secondary | ICD-10-CM | POA: Insufficient documentation

## 2013-03-02 DIAGNOSIS — D689 Coagulation defect, unspecified: Secondary | ICD-10-CM | POA: Insufficient documentation

## 2013-03-02 DIAGNOSIS — R791 Abnormal coagulation profile: Secondary | ICD-10-CM | POA: Insufficient documentation

## 2013-03-02 DIAGNOSIS — R011 Cardiac murmur, unspecified: Secondary | ICD-10-CM | POA: Insufficient documentation

## 2013-03-02 DIAGNOSIS — S8011XA Contusion of right lower leg, initial encounter: Secondary | ICD-10-CM

## 2013-03-02 DIAGNOSIS — Z8679 Personal history of other diseases of the circulatory system: Secondary | ICD-10-CM | POA: Insufficient documentation

## 2013-03-02 DIAGNOSIS — Z7901 Long term (current) use of anticoagulants: Secondary | ICD-10-CM

## 2013-03-02 DIAGNOSIS — S8010XA Contusion of unspecified lower leg, initial encounter: Secondary | ICD-10-CM | POA: Insufficient documentation

## 2013-03-02 DIAGNOSIS — K219 Gastro-esophageal reflux disease without esophagitis: Secondary | ICD-10-CM | POA: Insufficient documentation

## 2013-03-02 DIAGNOSIS — M129 Arthropathy, unspecified: Secondary | ICD-10-CM | POA: Insufficient documentation

## 2013-03-02 DIAGNOSIS — T45515A Adverse effect of anticoagulants, initial encounter: Secondary | ICD-10-CM | POA: Insufficient documentation

## 2013-03-02 DIAGNOSIS — Z7982 Long term (current) use of aspirin: Secondary | ICD-10-CM | POA: Insufficient documentation

## 2013-03-02 DIAGNOSIS — G4730 Sleep apnea, unspecified: Secondary | ICD-10-CM | POA: Insufficient documentation

## 2013-03-02 LAB — BASIC METABOLIC PANEL
BUN: 17 mg/dL (ref 6–23)
CO2: 28 mEq/L (ref 19–32)
Calcium: 8.6 mg/dL (ref 8.4–10.5)
Chloride: 102 mEq/L (ref 96–112)
Creatinine, Ser: 0.87 mg/dL (ref 0.50–1.35)
GFR calc Af Amer: >90 mL/min (ref >90)
GFR calc non Af Amer: 90 mL/min — ABNORMAL LOW (ref >90)
Glucose, Bld: 105 mg/dL — ABNORMAL HIGH (ref 70–99)
Potassium: 3.5 mEq/L (ref 3.5–5.1)
Sodium: 139 mEq/L (ref 135–145)

## 2013-03-02 LAB — CBC
HCT: 35.3 % — ABNORMAL LOW (ref 39.0–52.0)
Hemoglobin: 11.8 g/dL — ABNORMAL LOW (ref 13.0–17.0)
MCH: 27.3 pg (ref 26.0–34.0)
MCHC: 33.4 g/dL (ref 30.0–36.0)
MCV: 81.7 fL (ref 78.0–100.0)
Platelets: 170 10*3/uL (ref 150–400)
RBC: 4.32 MIL/uL (ref 4.22–5.81)
RDW: 15.4 % (ref 11.5–15.5)
WBC: 6.5 10*3/uL (ref 4.0–10.5)

## 2013-03-02 LAB — PROTIME-INR
INR: 2.15 — ABNORMAL HIGH (ref 0.00–1.49)
Prothrombin Time: 23.3 seconds — ABNORMAL HIGH (ref 11.6–15.2)

## 2013-03-02 LAB — APTT: aPTT: 42 seconds — ABNORMAL HIGH (ref 24–37)

## 2013-03-02 NOTE — ED Notes (Signed)
Dr. Silverio Lay at bedside to do bedside ultrasound of upper rt. Leg to determine venous/arterial blood clot.

## 2013-03-02 NOTE — ED Notes (Addendum)
Reports a tightening and pain to right thigh x 1 week. Pt reports bruising to thigh today. Large bruise noted to right inner thigh, color good to rest of leg.denies injury to leg. Ambulatory at triage.

## 2013-03-02 NOTE — ED Provider Notes (Signed)
CSN: [id number]     Arrival date & time 03/02/13  1746 History   First MD Initiated Contact with Patient 03/02/13 2018     Chief Complaint  Patient presents with  . Leg Pain   (Consider location/radiation/quality/duration/timing/severity/associated sxs/prior Treatment) Patient is a 62 y.o. male presenting with leg pain. The history is provided by the patient, medical records and the spouse. No language interpreter was used.  Leg Pain Associated symptoms: no back pain, no fatigue and no fever     Christopher [name] is a 62 y.o. male  with a hx of hypertension, severe mitral regurg with repair and March 2014, coronary artery disease, GERD, arthritis presents to the Emergency Department complaining of gradual, persistent, progressively worsening right leg pain beginning one week ago.  Patient states pain was in his right groin and right medial thigh. Patient reports that his pain resolved completely this morning but when he took a shower this evening he realized that he had a large bruise on the right medial thigh. Associated symptoms include filling at the site of the Bruce.  Nothing makes it better and nothing makes it worse.  Pt denies fever, chills, headache, neck pain, chest pain, shortness of breath, abdominal pain, nausea, vomiting, diarrhea, weakness, dizziness, syncope, dysuria, hematuria.     Past Medical History  Diagnosis Date  . Hypertension   . Endocarditis 2009    Strep viridans  . Rhinitis, allergic   . Severe mitral regurgitation     a. Mitral valve prolapse with flail segment of posterior leaflet and severe MR by TEE, remote h/o bacterial endocarditis   . Coronary artery disease     a. 2/7 Cath: LM nl, LAD min irregs, LCX min irregs, RI 40, RCA 83m, EF 55-60% basal to mid inf HK, 3-4+ MR;  b. 08/25/2012 PCI of RCA with 4.0x15 Vision BMS  . 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 Sleep apnea     NPSG 01/21/06- AHI 40.7/hr cpap  . Heart murmur   . GERD  (gastroesophageal reflux disease)     hx  . Arthritis   . 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 Septoplasty      Dr. [name]  . Uvulopalatopharyngoplasty    . Tonsillectomy    . Foot surgery      BILATERAL TOES  . Amputation  07/28/2012    Procedure: AMPUTATION DIGIT;  Surgeon: [name], MD;  Location: [facility] ORS;  Service: Orthopedics;  Laterality: Right;  2nd toe  . Tee without cardioversion N/A 08/12/2012    Procedure: TRANSESOPHAGEAL ECHOCARDIOGRAM (TEE);  Surgeon: [name], MD;  Location: [facility];  Service: Cardiovascular;  Laterality: N/A;  . Cardiac catheterization    . Mitral valve repair Right 09/27/2012    Procedure: MINIMALLY INVASIVE MITRAL VALVE REPAIR (MVR);  Surgeon: [name], MD;  Location: [facility] OR;  Service: Open Heart Surgery;  Laterality: Right;  Ultrasound guided  . Intraoperative transesophageal echocardiogram N/A 09/27/2012    Procedure: INTRAOPERATIVE TRANSESOPHAGEAL ECHOCARDIOGRAM;  Surgeon: [name], MD;  Location: [facility] OR;  Service: Open Heart Surgery;  Laterality: N/A;   Family History  Problem Relation Age of Onset  . Heart disease Father   . Cancer Father   . Diabetes Father   .  Diabetes Mother   . Stroke Mother    History  Substance Use Topics  . Smoking status: Former Smoker -- 2.50 packs/day for 5 years    Types: Cigarettes    Quit date: 07/06/1978  . Smokeless tobacco: Not on file     Comment: social drinker  . Alcohol Use: Yes     Comment: OCC.    Review of Systems  Constitutional: Negative for fever, diaphoresis, appetite change, fatigue and unexpected weight change.  HENT: Negative for mouth sores and neck stiffness.   Eyes: Negative for visual disturbance.  Respiratory: Negative for cough, chest tightness,  shortness of breath and wheezing.   Cardiovascular: Negative for chest pain.  Gastrointestinal: Negative for nausea, vomiting, abdominal pain, diarrhea and constipation.  Endocrine: Negative for polydipsia, polyphagia and polyuria.  Genitourinary: Negative for dysuria, urgency, frequency and hematuria.  Musculoskeletal: Positive for myalgias and arthralgias. Negative for back pain.  Skin: Positive for color change. Negative for rash.  Allergic/Immunologic: Negative for immunocompromised state.  Neurological: Negative for syncope, light-headedness and headaches.  Hematological: Does not bruise/bleed easily.  Psychiatric/Behavioral: Negative for sleep disturbance. The patient is not nervous/anxious.     Allergies  Codeine; Dust mite extract; Mold extract; and Pollen extract  Home Medications   Current Outpatient Rx  Name  Route  Sig  Dispense  Refill  . acetaminophen (TYLENOL) 325 MG tablet   Oral   Take 650 mg by mouth every 6 (six) hours as needed for pain.          Marland Kitchen amoxicillin-clavulanate (AUGMENTIN) 875-125 MG per tablet   Oral   Take 1 tablet by mouth 2 (two) times daily. For 10 days; Start date 02/24/13         . aspirin EC 81 MG tablet   Oral   Take 81 mg by mouth daily.         Marland Kitchen atorvastatin (LIPITOR) 20 MG tablet   Oral   Take 1 tablet (20 mg total) by mouth daily.   90 tablet   3   . cetirizine (ZYRTEC) 10 MG tablet   Oral   Take 10 mg by mouth daily.           . Cholecalciferol (VITAMIN D) 2000 UNITS CAPS   Oral   Take 1 capsule by mouth daily.         . fluticasone (FLONASE) 50 MCG/ACT nasal spray   Nasal   Place 2 sprays into the nose daily.   48 g   11   . furosemide (LASIX) 20 MG tablet   Oral   Take 1 tablet (20 mg total) by mouth daily.   30 tablet   6   . HYDROcodone-acetaminophen (NORCO) 5-325 MG per tablet   Oral   Take 1-2 tablets by mouth every 4 (four) hours as needed for pain.   30 tablet   0   . metoprolol succinate  (TOPROL-XL) 100 MG 24 hr tablet   Oral   Take 1 tablet (100 mg total) by mouth daily. Take with or immediately following a meal.   30 tablet   6   . potassium chloride (K-DUR) 10 MEQ tablet   Oral   Take 1 tablet (10 mEq total) by mouth daily.   30 tablet   6   . pseudoephedrine-guaifenesin (MUCINEX D) 60-600 MG per tablet   Oral   Take 1 tablet by mouth every 12 (twelve) hours.   18 tablet   0   . ramipril (  ALTACE) 10 MG capsule   Oral   Take 10 mg by mouth every morning.         . Rivaroxaban (XARELTO) 20 MG TABS   Oral   Take 1 tablet (20 mg total) by mouth daily with supper.   30 tablet   3    BP 142/64  Pulse 68  Temp(Src) 98 F (36.7 C) (Oral)  Resp 17  Ht 5\' 10"  (1.778 m)  Wt 223 lb (101.152 kg)  BMI 32 kg/m2  SpO2 95% Physical Exam  Nursing note and vitals reviewed. Constitutional: He is oriented to person, place, and time. He appears well-developed and well-nourished. No distress.  Awake, alert, nontoxic appearance  HENT:  Head: Normocephalic and atraumatic.  Mouth/Throat: Oropharynx is clear and moist. No oropharyngeal exudate.  Eyes: Conjunctivae are normal. Pupils are equal, round, and reactive to light. No scleral icterus.  Neck: Normal range of motion. Neck supple.  Cardiovascular: Normal rate, regular rhythm, normal heart sounds and intact distal pulses.   No murmur heard. Capillary refill less than 3 seconds  Pulmonary/Chest: Effort normal and breath sounds normal. No respiratory distress. He has no wheezes. He has no rales. He exhibits no tenderness.  Abdominal: Soft. Normal appearance and bowel sounds are normal. He exhibits no distension and no mass. There is no tenderness. There is no rebound, no guarding and no CVA tenderness.  Musculoskeletal: Normal range of motion. He exhibits no edema and no tenderness.       Cervical back: Normal.       Thoracic back: Normal.       Lumbar back: Normal.  ROM: Full range of motion of the T-spine,  L-spine, right hip, right knee, right ankle  Lymphadenopathy:    He has no cervical adenopathy.  Neurological: He is alert and oriented to person, place, and time. He exhibits normal muscle tone. Coordination normal.  Sensation intact to bilateral lower extremities Strength 5 out of 5 in bilateral lower extremities Speech is clear and goal oriented Moves extremities without ataxia   Skin: Skin is warm and dry. He is not diaphoretic.  No tenting of the skin Large area of ecchymosis to the right medial thigh beginning just below the groin and extending around to the posterior thigh and distally to the knee  Psychiatric: He has a normal mood and affect.    ED Course  Procedures (including critical care time) Labs Review Labs Reviewed  CBC - Abnormal; Notable for the following:    Hemoglobin 11.8 (*)    HCT 35.3 (*)    All other components within normal limits  BASIC METABOLIC PANEL - Abnormal; Notable for the following:    Glucose, Bld 105 (*)    GFR calc non Af Amer 90 (*)    All other components within normal limits  PROTIME-INR - Abnormal; Notable for the following:    Prothrombin Time 23.3 (*)    INR 2.15 (*)    All other components within normal limits  APTT - Abnormal; Notable for the following:    aPTT 42 (*)    All other components within normal limits   Imaging Review No results found.  MDM   1. Hematoma of leg, right, initial encounter   2. Supratherapeutic INR   3. Chronic anticoagulation    Christopher [name] presents with history of chronic anticoagulation and large area of ecchymosis on the right medial thigh.  Patient discussed with Dr. Silverio Lay and ultrasound of right thigh reveals no evidence  of arterial or venous thrombosis.  No evidence of arterial bleed causing the ecchymotic area. Patient on Xarelto and found to be supratherapeutic with an INR of 2.15 today.  As you with mild anemia at 11.8 the baseline.  Discussed with patient and spouse. Recommend cessation of  Xarelto until further discussion with the cardiologist.  He is alert, oriented, nontoxic, nonseptic appearing. Vital signs stable. No evidence of hypotension or hypoxia.  I have also discussed reasons to return immediately to the ER.  Patient expresses understanding and agrees with plan.  Dr. Silverio Lay was consulted, evaluated this patient with me and agrees with the plan.       Dahlia Client Lahela Woodin, PA-C 03/03/13 0231

## 2013-03-03 ENCOUNTER — Encounter: Payer: Self-pay | Admitting: Cardiology

## 2013-03-05 NOTE — ED Provider Notes (Signed)
Medical screening examination/treatment/procedure(s) were conducted as a shared visit with non-physician practitioner(s) and myself.  I personally evaluated the patient during the encounter  Christopher [name] is a 62 y.o. male hx of CAD, mitral valve repair in 3/14 on xarelto here with R leg pain and swelling. Pain for a week, noticed hematoma today. Denies falls or injury. Is on xarelto as per cardiology recommendation but he has no artificial valve or afib. INR 2.5. I performed bedside US showed no DVT. There is muscle hematoma anterior R leg that is not connected with the femoral artery. His H/h is stable. I told him to stop taking xarelto until he calls his cardiologist so that the hematoma won't get bigger.  EMERGENCY DEPARTMENT US SOFT TISSUE INTERPRETATION "Study: Limited Ultrasound of the noted body part in comments below"  INDICATIONS: Pain Multiple views of the body part are obtained with a multi-frequency linear probe  PERFORMED BY:  Myself  IMAGES ARCHIVED?: Yes  SIDE:Right   BODY PART:Lower extremity  FINDINGS: Other no DVT but there is R thigh hematoma  LIMITATIONS:  Emergent Procedure  INTERPRETATION:  No cellulitis noted  COMMENT:  R thigh hematoma. No abscess. No DVT      Richardean Canal, MD 03/05/13 2145

## 2013-03-09 ENCOUNTER — Other Ambulatory Visit: Payer: Self-pay | Admitting: *Deleted

## 2013-03-09 ENCOUNTER — Encounter: Payer: Self-pay | Admitting: Cardiology

## 2013-03-09 ENCOUNTER — Encounter (HOSPITAL_COMMUNITY): Payer: Self-pay | Admitting: Emergency Medicine

## 2013-03-09 ENCOUNTER — Emergency Department (HOSPITAL_COMMUNITY)
Admission: EM | Admit: 2013-03-09 | Discharge: 2013-03-09 | Disposition: A | Discharge status: Home / Self Care | Payer: BC Managed Care – PPO | Attending: Emergency Medicine | Admitting: Emergency Medicine

## 2013-03-09 DIAGNOSIS — R233 Spontaneous ecchymoses: Secondary | ICD-10-CM | POA: Insufficient documentation

## 2013-03-09 DIAGNOSIS — Z7982 Long term (current) use of aspirin: Secondary | ICD-10-CM | POA: Insufficient documentation

## 2013-03-09 DIAGNOSIS — Z8669 Personal history of other diseases of the nervous system and sense organs: Secondary | ICD-10-CM | POA: Insufficient documentation

## 2013-03-09 DIAGNOSIS — M129 Arthropathy, unspecified: Secondary | ICD-10-CM | POA: Insufficient documentation

## 2013-03-09 DIAGNOSIS — Z9861 Coronary angioplasty status: Secondary | ICD-10-CM | POA: Insufficient documentation

## 2013-03-09 DIAGNOSIS — Z9889 Other specified postprocedural states: Secondary | ICD-10-CM | POA: Insufficient documentation

## 2013-03-09 DIAGNOSIS — I10 Essential (primary) hypertension: Secondary | ICD-10-CM | POA: Insufficient documentation

## 2013-03-09 DIAGNOSIS — S98139A Complete traumatic amputation of one unspecified lesser toe, initial encounter: Secondary | ICD-10-CM | POA: Insufficient documentation

## 2013-03-09 DIAGNOSIS — I059 Rheumatic mitral valve disease, unspecified: Secondary | ICD-10-CM

## 2013-03-09 DIAGNOSIS — Z79899 Other long term (current) drug therapy: Secondary | ICD-10-CM | POA: Insufficient documentation

## 2013-03-09 DIAGNOSIS — M7989 Other specified soft tissue disorders: Secondary | ICD-10-CM

## 2013-03-09 DIAGNOSIS — Z87891 Personal history of nicotine dependence: Secondary | ICD-10-CM | POA: Insufficient documentation

## 2013-03-09 DIAGNOSIS — I2510 Atherosclerotic heart disease of native coronary artery without angina pectoris: Secondary | ICD-10-CM | POA: Insufficient documentation

## 2013-03-09 DIAGNOSIS — R011 Cardiac murmur, unspecified: Secondary | ICD-10-CM | POA: Insufficient documentation

## 2013-03-09 DIAGNOSIS — Z8719 Personal history of other diseases of the digestive system: Secondary | ICD-10-CM | POA: Insufficient documentation

## 2013-03-09 LAB — CBC WITH DIFFERENTIAL/PLATELET
Basophils Absolute: 0 10*3/uL (ref 0.0–0.1)
Basophils Relative: 0 % (ref 0–1)
Eosinophils Absolute: 0.2 10*3/uL (ref 0.0–0.7)
Eosinophils Relative: 2 % (ref 0–5)
HCT: 36.9 % — ABNORMAL LOW (ref 39.0–52.0)
Hemoglobin: 12.1 g/dL — ABNORMAL LOW (ref 13.0–17.0)
Lymphocytes Relative: 20 % (ref 12–46)
Lymphs Abs: 1.4 10*3/uL (ref 0.7–4.0)
MCH: 27.2 pg (ref 26.0–34.0)
MCHC: 32.8 g/dL (ref 30.0–36.0)
MCV: 82.9 fL (ref 78.0–100.0)
Monocytes Absolute: 0.4 10*3/uL (ref 0.1–1.0)
Monocytes Relative: 6 % (ref 3–12)
Neutro Abs: 5 10*3/uL (ref 1.7–7.7)
Neutrophils Relative %: 72 % (ref 43–77)
Platelets: 217 10*3/uL (ref 150–400)
RBC: 4.45 MIL/uL (ref 4.22–5.81)
RDW: 15.9 % — ABNORMAL HIGH (ref 11.5–15.5)
WBC: 7 10*3/uL (ref 4.0–10.5)

## 2013-03-09 LAB — APTT: aPTT: 25 seconds (ref 24–37)

## 2013-03-09 LAB — PROTIME-INR
INR: 1.02 (ref 0.00–1.49)
Prothrombin Time: 13.2 seconds (ref 11.6–15.2)

## 2013-03-09 NOTE — Progress Notes (Signed)
VASCULAR LAB PRELIMINARY  PRELIMINARY  PRELIMINARY  PRELIMINARY  Right lower extremity venous duplex completed.    Preliminary report:  Right:  No evidence of DVT, superficial thrombosis, or Baker's cyst.  Caela Huot, RVT 03/09/2013, 1:30 PM

## 2013-03-09 NOTE — ED Provider Notes (Signed)
CSN: [id number]     Arrival date & time 03/09/13  1049 History   First MD Initiated Contact with Patient 03/09/13 1124     Chief Complaint  Patient presents with  . Leg Pain  . Bleeding/Bruising   (Consider location/radiation/quality/duration/timing/severity/associated sxs/prior Treatment) HPI Patient presents for the third time to one of our facilities to do an ongoing issue with his right leg. Please see prior notes for the onset. Essentially, over the past few days, what have been upper right lower extremity ecchymosis and swelling has progressed to encompass the right calf. There is no new dyspnea, no new pain or weakness, no new dysesthesia.  However, the patient notes that he has had progressive edema and ecchymosis about the right calf. He continues to not take his Xarelto. He denies any new chest pain, dyspnea, fevers, chills, rash.  he notes that his symptoms seem to be worse during the day, improved overnight. Past Medical History  Diagnosis Date  . Hypertension   . Endocarditis 2009    Strep viridans  . Rhinitis, allergic   . Severe mitral regurgitation     a. Mitral valve prolapse with flail segment of posterior leaflet and severe MR by TEE, remote h/o bacterial endocarditis   . Coronary artery disease     a. 2/7 Cath: LM nl, LAD min irregs, LCX min irregs, RI 40, RCA 52m, EF 55-60% basal to mid inf HK, 3-4+ MR;  b. 08/25/2012 PCI of RCA with 4.0x15 Vision BMS  . 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 Sleep apnea     NPSG 01/21/06- AHI 40.7/hr cpap  . Heart murmur   . GERD (gastroesophageal reflux disease)     hx  . Arthritis   . 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 Septoplasty      Dr. [name]  . Uvulopalatopharyngoplasty    .  Tonsillectomy    . Foot surgery      BILATERAL TOES  . Amputation  07/28/2012    Procedure: AMPUTATION DIGIT;  Surgeon: [name], MD;  Location: [facility] ORS;  Service: Orthopedics;  Laterality: Right;  2nd toe  . Tee without cardioversion N/A 08/12/2012    Procedure: TRANSESOPHAGEAL ECHOCARDIOGRAM (TEE);  Surgeon: [name], MD;  Location: [facility] ENDOSCOPY;  Service: Cardiovascular;  Laterality: N/A;  . Cardiac catheterization    . Mitral valve repair Right 09/27/2012    Procedure: MINIMALLY INVASIVE MITRAL VALVE REPAIR (MVR);  Surgeon: [name], MD;  Location: [facility] OR;  Service: Open Heart Surgery;  Laterality: Right;  Ultrasound guided  . Intraoperative transesophageal echocardiogram N/A 09/27/2012    Procedure: INTRAOPERATIVE TRANSESOPHAGEAL ECHOCARDIOGRAM;  Surgeon: [name], MD;  Location: [facility] OR;  Service: Open Heart Surgery;  Laterality: N/A;   Family History  Problem Relation Age of Onset  . Heart disease Father   . Cancer Father   . Diabetes Father   . Diabetes Mother   . Stroke Mother    History  Substance Use Topics  . Smoking status: Former Smoker -- 2.50 packs/day for 5 years    Types: Cigarettes    Quit date: 07/06/1978  . Smokeless tobacco: Not on file     Comment: social drinker  . Alcohol Use: Yes     Comment: OCC.  Review of Systems  Constitutional:       Per HPI, otherwise negative  HENT:       Per HPI, otherwise negative  Respiratory:       Per HPI, otherwise negative  Cardiovascular:       Per HPI, otherwise negative  Gastrointestinal: Negative for vomiting.  Endocrine:       Negative aside from HPI  Genitourinary:       Neg aside from HPI   Musculoskeletal:       Per HPI, otherwise negative  Skin: Negative.   Neurological: Negative for syncope.    Allergies  Codeine; Dust mite extract; Mold extract; and Pollen extract  Home Medications   Current Outpatient Rx  Name  Route  Sig  Dispense  Refill  . acetaminophen (TYLENOL) 325  MG tablet   Oral   Take 650 mg by mouth every 6 (six) hours as needed for pain.          Marland Kitchen aspirin EC 81 MG tablet   Oral   Take 81 mg by mouth daily.         Marland Kitchen atorvastatin (LIPITOR) 20 MG tablet   Oral   Take 1 tablet (20 mg total) by mouth daily.   90 tablet   3   . cetirizine (ZYRTEC) 10 MG tablet   Oral   Take 10 mg by mouth daily.           . Cholecalciferol (VITAMIN D) 2000 UNITS CAPS   Oral   Take 1 capsule by mouth daily.         . fluticasone (FLONASE) 50 MCG/ACT nasal spray   Nasal   Place 2 sprays into the nose daily.   48 g   11   . furosemide (LASIX) 20 MG tablet   Oral   Take 1 tablet (20 mg total) by mouth daily.   30 tablet   6   . metoprolol succinate (TOPROL-XL) 100 MG 24 hr tablet   Oral   Take 1 tablet (100 mg total) by mouth daily. Take with or immediately following a meal.   30 tablet   6   . naproxen sodium (ANAPROX) 550 MG tablet   Oral   Take 550 mg by mouth 2 (two) times daily with a meal.         . potassium chloride (K-DUR) 10 MEQ tablet   Oral   Take 1 tablet (10 mEq total) by mouth daily.   30 tablet   6   . ramipril (ALTACE) 10 MG capsule   Oral   Take 10 mg by mouth every morning.         Marland Kitchen HYDROcodone-acetaminophen (NORCO) 5-325 MG per tablet   Oral   Take 1-2 tablets by mouth every 4 (four) hours as needed for pain.   30 tablet   0   . pseudoephedrine-guaifenesin (MUCINEX D) 60-600 MG per tablet   Oral   Take 1 tablet by mouth every 12 (twelve) hours.   18 tablet   0    BP 152/85  Pulse 99  Temp(Src) 97.8 F (36.6 C) (Oral)  Resp 20  SpO2 100% Physical Exam  Nursing note and vitals reviewed. Constitutional: He is oriented to person, place, and time. He appears well-developed and well-nourished. No distress.  Awake, alert, nontoxic appearance  HENT:  Head: Normocephalic and atraumatic.  Mouth/Throat: No oropharyngeal exudate.  Eyes: Conjunctivae are normal. Pupils are equal, round, and  reactive to light. No  scleral icterus.  Neck: No tracheal deviation present.  Cardiovascular: Normal rate, regular rhythm and intact distal pulses.   Capillary refill less than 3 seconds with good color on the right foot  Pulmonary/Chest: Effort normal. No stridor. No respiratory distress.  Abdominal: Normal appearance. There is no CVA tenderness.  Musculoskeletal: Normal range of motion. He exhibits no edema and no tenderness.       Cervical back: Normal.       Thoracic back: Normal.       Lumbar back: Normal.  ROM: Full range of motion of the right leg, thigh, knee, ankle. There is gross edema about the right calf.  Neurological: He is alert and oriented to person, place, and time. He exhibits normal muscle tone. Coordination normal.  Sensation intact to bilateral lower extremities Strength 5 out of 5 in bilateral lower extremities Speech is clear and goal oriented Moves extremities without ataxia   Skin: Skin is warm and dry. He is not diaphoretic.  There is trace ecchymosis about the right medial thigh, but notable ecchymosis circumferentially about the right calf  Psychiatric: He has a normal mood and affect.    ED Course  Procedures (including critical care time) Labs Review Labs Reviewed  CBC WITH DIFFERENTIAL - Abnormal; Notable for the following:    Hemoglobin 12.1 (*)    HCT 36.9 (*)    RDW 15.9 (*)    All other components within normal limits  PROTIME-INR  APTT   Imaging Review No results found. Both prior to, and following my initial evaluation I reviewed the patient's chart, including multiple ultrasound studies in the past week MDM  No diagnosis found. Patient presents for the third time with ongoing right lower extremity symptoms.  Notably, the patient has no lightheadedness, no chest pain, dyspnea, fevers, chills. With the degree of swelling, there is some suspicion for DVT, though this seems unlikely.  The patient's labs, ultrasound are reassuring.      absent fever, superficial changes, leukocytosis and low suspicion for infection.  Patient is neurovascularly intact with good distal pulses, making thromboembolic or arterial occlusion very unlikely.  Patient was discharged in stable condition to follow up with his primary care physician to discuss anticoagulant needs.  Gerhard Munch, MD 03/09/13 [phone]

## 2013-03-09 NOTE — ED Notes (Signed)
On 8/22 went to urgent care for hematoma to rt leg, brusing increased and went back to [place] on the 28th pt states that he was on coumadin and was told to stop had doppler done and did not see any clots. Has stopped coumadin but still seeing increase brusing to rt leg now is in the foot, denies any injury, swelling to rt foot now.

## 2013-03-11 ENCOUNTER — Encounter: Payer: Self-pay | Admitting: Cardiology

## 2013-03-13 ENCOUNTER — Encounter: Payer: Self-pay | Admitting: Thoracic Surgery (Cardiothoracic Vascular Surgery)

## 2013-03-13 ENCOUNTER — Ambulatory Visit (INDEPENDENT_AMBULATORY_CARE_PROVIDER_SITE_OTHER): Payer: BC Managed Care – PPO | Admitting: Thoracic Surgery (Cardiothoracic Vascular Surgery)

## 2013-03-13 ENCOUNTER — Ambulatory Visit
Admission: RE | Admit: 2013-03-13 | Discharge: 2013-03-13 | Disposition: A | Discharge status: Home / Self Care | Payer: BC Managed Care – PPO | Source: Ambulatory Visit | Attending: Thoracic Surgery (Cardiothoracic Vascular Surgery) | Admitting: Thoracic Surgery (Cardiothoracic Vascular Surgery)

## 2013-03-13 VITALS — BP 158/84 | HR 81 | Resp 16 | Ht 70.0 in | Wt 227.0 lb

## 2013-03-13 DIAGNOSIS — I059 Rheumatic mitral valve disease, unspecified: Secondary | ICD-10-CM

## 2013-03-13 DIAGNOSIS — Z9889 Other specified postprocedural states: Secondary | ICD-10-CM

## 2013-03-13 DIAGNOSIS — I340 Nonrheumatic mitral (valve) insufficiency: Secondary | ICD-10-CM

## 2013-03-13 NOTE — Progress Notes (Signed)
[address]       [name] [address]             [phone]     CARDIOTHORACIC SURGERY OFFICE NOTE  Referring Provider is [name], MD PCP is Tally Due, MD   HPI:   Patient returns for followup status post minimally invasive mitral valve repair on 09/27/2012.  His early postoperative recovery was notable for postoperative atrial fibrillation and the development of a large right pleural effusion requiring thoracentesis. These resolved and the patient was last seen here in the office on 12/05/2012. Since then he has continued to do quite well. He has been seen regularly in followup by Dr. [name] who switched is anticoagulation to apixaban and obtained a follow up echocardiogram in July.  followup echocardiogram was notable for intact mitral valve repair, and no mitral regurgitation, and improvement in left ventricular ejection fraction to 45-50%.  The patient has continued to do well since then, although 2 weeks ago he developed a spontaneous hematoma in his right thigh for which she was seen in the emergency department.  Xarelto was stopped at that time.  The hematoma has since then resolved.  The patient reports that he feels quite good. He has no exertional shortness of breath and he notes that his breathing is considerably better than it was prior to surgery last spring. He is back at work and getting along without any particular difficulties. He admits that he has gained a little bit of weight. He has not had any tachypalpitations suggestive of recurrent atrial fibrillation.   Current Outpatient Prescriptions  Medication Sig Dispense Refill  . aspirin EC 81 MG tablet Take 81 mg by mouth daily.      Marland Kitchen atorvastatin (LIPITOR) 20 MG tablet Take 1 tablet (20 mg total) by mouth daily.  90 tablet  3  . cetirizine (ZYRTEC) 10 MG tablet Take 10 mg by mouth daily.        . Cholecalciferol (VITAMIN D) 2000 UNITS CAPS Take 1 capsule by mouth daily.      .  fluticasone (FLONASE) 50 MCG/ACT nasal spray Place 2 sprays into the nose daily.  48 g  11  . furosemide (LASIX) 20 MG tablet Take 1 tablet (20 mg total) by mouth daily.  30 tablet  6  . metoprolol succinate (TOPROL-XL) 100 MG 24 hr tablet Take 1 tablet (100 mg total) by mouth daily. Take with or immediately following a meal.  30 tablet  6  . naproxen sodium (ANAPROX) 550 MG tablet Take 550 mg by mouth 2 (two) times daily with a meal.      . potassium chloride (K-DUR) 10 MEQ tablet Take 1 tablet (10 mEq total) by mouth daily.  30 tablet  6  . ramipril (ALTACE) 10 MG capsule Take 10 mg by mouth every morning.       No current facility-administered medications for this visit.      Physical Exam:   BP 158/84  Pulse 81  Resp 16  Ht 5\' 10"  (1.778 m)  Wt 227 lb (102.967 kg)  BMI 32.57 kg/m2  SpO2 98%  General:  Well-appearing  Chest:   Clear to auscultation with symmetrical breath sounds  CV:   Regular rate and rhythm without murmur  Incisions:  Completely healed  Abdomen:  Soft and nontender  Extremities:  Warm and well-perfused. Recent hematoma in the right thigh has apparently resolved  Diagnostic Tests:  Transthoracic Echocardiography  Patient:    Moriah, Loughry MR #:       [id number] Study Date: 01/27/2013 Gender:     M Age:        62 Height:     177.8cm Weight:     98.9kg BSA:        2.27m^2 Pt. Status: Room:    ATTENDING    Audria Nine, [place]  REFERRING    [name], [place]  PERFORMING   [facility], [facility]  SONOGRAPHER  Junious Dresser, RDCS cc:  ------------------------------------------------------------ LV EF: 45% -   50%  ------------------------------------------------------------ Indications:      428.0 CHF.  424.0 Mitral valve disease. 427.31 Atrial Fibrillation.  CAD of native vessels 414.01.   ------------------------------------------------------------ History:   PMH:  Acquired from the patient and from the patient's chart.   Atrial fibrillation.  Coronary artery disease.  Congestive heart failure, with an ejection fraction of 40%by echocardiography. The dysfunction is primarily systolic.  Mitral valve disease, post annuloplasty.  Risk factors:  Hypertension. Obese. Dyslipidemia.  ------------------------------------------------------------ Study Conclusions  - Left ventricle: Inferobasal hypokinesis and septal   hypokinesis The cavity size was mildly dilated. Wall   thickness was increased in a pattern of mild LVH. Systolic   function was mildly reduced. The estimated ejection   fraction was in the range of 45% to 50%. - Aortic valve: Trivial regurgitation. - Mitral valve: S/P mitral valve repair with no significant   regurgitations and small diastolic gradient Mean gradient:   6mm Hg (D). Peak gradient: 10mm Hg (D). Valve area by   pressure half-time: 2.04cm^2. Valve area by continuity   equation (using LVOT flow): 1.79cm^2. - Left atrium: The atrium was mildly dilated.  ------------------------------------------------------------ Labs, prior tests, procedures, and surgery: Echocardiography (April 2014).     EF was 40%. Mitral valve: peak gradient of 8mm Hg and mean gradient of 4mm Hg.  Coronary artery bypass grafting. Surgery.     Mitral valve repair of an annular ring. Transthoracic echocardiography.  M-mode, complete 2D, spectral Doppler, and color Doppler.  Height:  Height: 177.8cm. Height: 70in.  Weight:  Weight: 98.9kg. Weight: 217.5lb.  Body mass index:  BMI: 31.3kg/m^2.  Body surface area:    BSA: 2.52m^2.  Blood pressure:     138/88.  Patient status:  Outpatient.  Location:  [facility]  ------------------------------------------------------------  ------------------------------------------------------------ Left ventricle:  Inferobasal hypokinesis and septal hypokinesis The cavity size was mildly dilated. Wall thickness was increased in a pattern of mild LVH. Systolic function  was mildly reduced. The estimated ejection fraction was in the range of 45% to 50%.  ------------------------------------------------------------ Aortic valve:   Mildly calcified leaflets.  Doppler: Trivial regurgitation.  ------------------------------------------------------------ Mitral valve:  S/P mitral valve repair with no significant regurgitations and small diastolic gradient  Doppler: Trivial regurgitation.    Valve area by pressure half-time: 2.04cm^2. Indexed valve area by pressure half-time: 0.94cm^2/m^2. Valve area by continuity equation (using LVOT flow): 1.79cm^2. Indexed valve area by continuity equation (using LVOT flow): 0.82cm^2/m^2.    Mean gradient: 6mm Hg (D). Peak gradient: 10mm Hg (D).  ------------------------------------------------------------ Left atrium:  The atrium was mildly dilated.  ------------------------------------------------------------ Right ventricle:  The cavity size was normal. Wall thickness was normal. Systolic function was normal.  ------------------------------------------------------------ Pulmonic valve:    Doppler:   Trivial regurgitation.  ------------------------------------------------------------ Tricuspid valve:   Doppler:   Mild regurgitation.  ------------------------------------------------------------  2D measurements        Normal  Doppler measurements   Normal Left ventricle                 Main pulmonary LVID ED,     59 mm     43-52   artery chord,                         Pressure,    27 mm Hg  =30 PLAX                           S LVID ES,   37.6 mm     23-38   Left ventricle chord,                         Ea, lat    5.48 cm/s   ------ PLAX                           ann, tiss FS, chord,   36 %      >29     DP PLAX                           E/Ea, lat  24.6        ------ LVPW, ED   10.9 mm     ------  ann, tiss     4 IVS/LVPW   1.03        <1.3    DP ratio, ED                      Ea, med    3.95 cm/s    ------ Vol ED,     216 ml     ------  ann, tiss MOD1                           DP Vol ES,     126 ml     ------  E/Ea, med  34.1        ------ MOD1                           ann, tiss     8 EF, MOD1     42 %      ------  DP Vol index,  100 ml/m^2 ------  LVOT ED, MOD1                       Peak vel,  92.5 cm/s   ------ Vol index,   58 ml/m^2 ------  S ES, MOD1                       VTI, S     20.1 cm     ------ Vol ED,     196 ml     ------  Stroke vol 98.7 ml     ------ MOD2                           Stroke     45.5 ml/m^2 ------ Vol ES,     115 ml     ------  index MOD2  Aortic valve EF, MOD2     41 %      ------  Regurg PHT 1033 ms     ------ Stroke       81 ml     ------  Mitral valve vol, MOD2                      Peak E vel  135 cm/s   ------ Vol index,   90 ml/m^2 ------  Peak A vel  128 cm/s   ------ ED, MOD2                       Mean vel,   114 cm/s   ------ Vol index,   53 ml/m^2 ------  D ES, MOD2                       Decelerati  313 ms     150-23 Stroke     37.3 ml/m^2 ------  on time                0 index,                         Pressure    108 ms     ------ MOD2                           half-time Ventricular septum             Mean          6 mm Hg  ------ IVS, ED    11.2 mm     ------  gradient, LVOT                           D Diam, S      25 mm     ------  Peak         10 mm Hg  ------ Area       4.91 cm^2   ------  gradient, Diam         25 mm     ------  D Aorta                          Peak E/A    1.1        ------ Root diam,   34 mm     ------  ratio ED                             Area (PHT) 2.04 cm^2   ------ AAo AP       33 mm     ------  Area index 0.94 cm^2/m ------ diam, S                        (PHT)           ^2 Left atrium                    Area       1.79 cm^2   ------ AP dim       44 mm     ------  (LVOT) AP dim     2.03 cm/m^2 <2.2    continuity  index                          Area index 0.82 cm^2/m ------                                 (LVOT           ^2                                cont)                                Annulus      55 cm     ------                                VTI                                Tricuspid valve                                Regurg      236 cm/s   ------                                peak vel                                Peak RV-RA   22 mm Hg  ------                                gradient,                                S                                Systemic veins                                Estimated     5 mm Hg  ------                                CVP                                Right ventricle                                Pressure,    27 mm Hg  <30  S                                Sa vel,    12.9 cm/s   ------                                lat ann,                                tiss DP   ------------------------------------------------------------ Prepared and Electronically Authenticated by  Charlton Haws 2014-07-25T16:37:44.210    *RADIOLOGY REPORT*    CHEST - 2 VIEW   Comparison: 12/12/2012 and CT chest 12/07/2012.   Findings: Trachea is midline.  Heart size stable.  Obscuration of the right heart border is better evaluated on 12/07/2012. Lungs are otherwise clear.  No pleural fluid.  Degenerative changes are seen in the spine.   IMPRESSION:   1.  No acute findings. 2.  Obscuration of the right heart border is better evaluated on 12/07/2012.     Original Report Authenticated By: Leanna Battles, M.D.   Impression:  Patient is doing very well 6 months following minimally invasive mitral valve repair.     Plan:  In the future the patient will call and return to see [place] only as needed.   Salvatore Decent. Cornelius Moras, MD 03/13/2013 1:02 PM

## 2013-03-13 NOTE — Patient Instructions (Signed)
Patient may resume normal physical activity without any particular limitations at this time, and return to our office only as needed should any further problems or questions arise.   Endocarditis is a potentially serious infection of heart valves or inside lining of the heart.  It occurs more commonly in patients with diseased heart valves (such as patient's with aortic or mitral valve disease) and in patients who have undergone heart valve repair or replacement.  Certain surgical and dental procedures may put you at risk, such as dental cleaning, other dental procedures, or any surgery involving the respiratory, urinary, gastrointestinal tract, gallbladder or prostate gland.   To minimize your chances for develooping endocarditis, maintain good oral health and seek prompt medical attention for any infections involving the mouth, teeth, gums, skin or urinary tract.  Always notify your doctor or dentist about your underlying heart valve condition before having any invasive procedures. You will need to take antibiotics before certain procedures.        

## 2013-03-14 ENCOUNTER — Encounter: Payer: Self-pay | Admitting: Cardiology

## 2013-03-14 ENCOUNTER — Ambulatory Visit (INDEPENDENT_AMBULATORY_CARE_PROVIDER_SITE_OTHER): Payer: BC Managed Care – PPO | Admitting: Cardiology

## 2013-03-14 VITALS — BP 132/68 | HR 77 | Ht 70.0 in | Wt 225.0 lb

## 2013-03-14 DIAGNOSIS — I2510 Atherosclerotic heart disease of native coronary artery without angina pectoris: Secondary | ICD-10-CM

## 2013-03-14 DIAGNOSIS — E785 Hyperlipidemia, unspecified: Secondary | ICD-10-CM

## 2013-03-14 DIAGNOSIS — I5022 Chronic systolic (congestive) heart failure: Secondary | ICD-10-CM

## 2013-03-14 DIAGNOSIS — I340 Nonrheumatic mitral (valve) insufficiency: Secondary | ICD-10-CM

## 2013-03-14 DIAGNOSIS — I4891 Unspecified atrial fibrillation: Secondary | ICD-10-CM

## 2013-03-14 DIAGNOSIS — I059 Rheumatic mitral valve disease, unspecified: Secondary | ICD-10-CM

## 2013-03-14 DIAGNOSIS — I509 Heart failure, unspecified: Secondary | ICD-10-CM

## 2013-03-14 NOTE — Progress Notes (Signed)
Patient ID: Christopher [name], male   DOB: [birth year], 62 y.o.   MRN: [id number] PCP: Dr. Perrin Maltese  63 yo with history of mitral valve prolapse and prior mitral valve endocarditis with severe mitral regurgitation presents for cardiology followup. He had a TEE in 7/13 that showed EF 55-60% with partial flail posterior leaflet and at least moderate but probably severe eccentric anteriorly directed MR.  The LV was upper normal in size.  Patient had noted the onset of exertional dyspnea.  No chest pain.  I took him for TEE, which showed flail P2 and P1 segments of the mitral valve with a ruptured chord.  There was severe MR.  LHC showed 95% mRCA stenosis.  As he wanted a minimally invasive mitral valve repair, arranged for PCI to the mid RCA, which was done using BMS.  After 1 month of Plavix, he had a minimally-invasive mitral valve repair with uncomplicated hospital course.  He was started on coumadin for a few months post-operatively.  Initially, he did well and was walking for 30 minutes a day.  However,  prior to the last appointment, he noted dyspnea with walking about 50 feet or walking up steps.  Lasix was started.  CXR showed a new right pleural effusion and he has now had 2 thoracenteses. He was noted to be in atrial fibrillation as well at a prior appointment. He went back into NSR on amiodarone.  Post-op echo showed that the mitral valve looked good but EF was 35-40% with global hypokinesis.  Repeat echo in 7/14 showed EF 45-50% with stable mitral valve repair.    Patient had to stop Eliquis due to headaches.  He then started Xarelto.  He developed a spontaneous right thigh hematoma while on Xarelto and this was stopped also.  Hemoglobin has been stable.  He is now on ASA 81 mg daily only.  He is working full time. He feels good.  Weight is up but he is eating more.  He is not taking Lasix now.  No chest pain or significant exertional dyspnea.  No tachypalpitations.   ECG: NSR, RBBB, LAFB, inferior Qs    Labs (7/12): LDL 128 Labs (7/13): K 3.9, creatinine 0.9, HDL 54, LDL 133 Labs (4/14): K 4.2, creatinine 1.2, TSH normal, LFTs normal, BNP 132 Labs (5/14): LDL 57, HDL 33 Labs (9/14): HCT 36.9, K 3.5, creatinine 0.87  PMH: 1. HTN 2. H/o sinusitis 3. Nephrolithiasis 4. Mitral valve disease: Baseline mitral valve prolapse.  Had MV endocarditis (Strep viridans) in 4/09.  Has had moderate to severe mitral regurgitation.  Echo (7/12): EF 60%, upper normal LV size (EDD 55 mm, ESD 35 mm), moderate (grade II) diastolic dysfunction with E/medial e' < 15, moderate to possibly severe eccentric MR with mitral valve prolapse and partial flail posterior leaflet, mild LAE. Echo (7/13): EF 55-60%, upper normal LV size, grade II diastolic dysfunction with E/e' < 15, IVC normal, flail segment of posterior leaflet with at least moderate and probably severe anteriorly directed MR, normal RV, unable to measure PA systolic pressure.  TEE (2/14) with EF 55-60%, flail P2 and P1 segments of the posterior leaflet with a ruptured chord, eccentric severe MR.  Status post minimally-invasive mitral valve repair in 3/14.  5. CAD: LHC (2/14) showed 95% mRCA stenosis.  This was treated with BMS.  6. Atrial fibrillation: First noted in 4/14. Headaches with Eliquis.  Thigh hematoma with Xarelto.  7. Cardiomyopathy: Echo (4/14) with EF 35-40%, diffuse hypokinesis, s/p mitral valve repair with  no mitral regurgitation.  Echo (7/14) with EF 45-50%, septal hypokinesis, MV repair w/o MR or significant stenosis.  8. Right-sided pleural effusion post-MV surgery  SH: Works at [employer] on [employer] [address] [employer].  Divorced.  Occasional ETOH.  No smoking.   FH: Father with CAD, brother with MI at 51.    ROS: All systems reviewed and negative except as per HPI.   Current Outpatient Prescriptions  Medication Sig Dispense Refill  . aspirin EC 81 MG tablet Take 81 mg by mouth daily.      Marland Kitchen atorvastatin (LIPITOR) 20 MG tablet Take 1 tablet (20 mg  total) by mouth daily.  90 tablet  3  . cetirizine (ZYRTEC) 10 MG tablet Take 10 mg by mouth daily.        . Cholecalciferol (VITAMIN D) 2000 UNITS CAPS Take 1 capsule by mouth daily.      . fluticasone (FLONASE) 50 MCG/ACT nasal spray Place 2 sprays into the nose daily.  48 g  11  . metoprolol succinate (TOPROL-XL) 100 MG 24 hr tablet Take 1 tablet (100 mg total) by mouth daily. Take with or immediately following a meal.  30 tablet  6  . naproxen sodium (ANAPROX) 550 MG tablet Take 550 mg by mouth 2 (two) times daily with a meal.      . ramipril (ALTACE) 10 MG capsule Take 10 mg by mouth every morning.       No current facility-administered medications for this visit.    BP 132/68  Pulse 77  Ht 5\' 10"  (1.778 m)  Wt 102.059 kg (225 lb)  BMI 32.28 kg/m2 General: NAD Neck: No JVD, no thyromegaly or thyroid nodule.  Lungs: Slight decreased breath sounds right base. CV: Nondisplaced PMI.  Heart regular S1/S2, no S3/S4, no significant murmur.  No peripheral edema.  No carotid bruit.  Normal pedal pulses.  Abdomen: Soft, nontender, no hepatosplenomegaly, no distention.  Neurologic: Alert and oriented x 3.  Psych: Normal affect. Extremities: No clubbing or cyanosis.   Assessment/Plan:  Mitral valve regurgitation  Severe eccentric MR in the setting of MV prolapse and history of MV endocarditis, now s/p minimally invasive MV repair.  The valve looked good on 7/14 echo.   Atrial fibrillation He has remained in NSR. He did not tolerate Eliquis due to headaches and developed a thigh hematoma spontaneously on Xarelto.  He is now off anticoagulation.  He had post-op atrial fibrillation after MVR, but the atrial fibrillation occurred relatively far out from the procedure.  He continues to be in NSR.  No symptoms concerning for atrial fibrillation at this time.    - Continue Toprol XL - I will arrange for Reveal monitor placement.  I will use this to see if he has atrial fibrillation recurrence. If  he does, will need to revisit restarting an anticoagulant.  If not, given his recent spontaneous thigh hematoma, would hold off.  CAD Status post PCI to [facility] with BMS.  Stable, no significant ischemic chest pain.  Continue ASA, statin.  Hyperlipidemia Statin begun with CAD.  Good lipids in 5/14.  Cardiomyopathy EF improved to 45-50% on last echo.  NYHA class I-II.  - Continue ramipril and Toprol XL at current doses.  - No need for Lasix at this time.   [name] 03/14/2013

## 2013-03-14 NOTE — Patient Instructions (Addendum)
Your physician wants you to follow-up in: 4 months with Dr [name]. (January 2015). You will receive a reminder letter in the mail two months in advance. If you don't receive a letter, please call our office to schedule the follow-up appointment.

## 2013-03-27 ENCOUNTER — Encounter (HOSPITAL_COMMUNITY): Payer: Self-pay | Admitting: Pharmacy Technician

## 2013-03-28 ENCOUNTER — Other Ambulatory Visit: Payer: Self-pay

## 2013-03-28 MED ORDER — METOPROLOL SUCCINATE ER 100 MG PO TB24: 100.0000 mg | ORAL_TABLET | Freq: Every day | ORAL | Status: DC

## 2013-03-30 ENCOUNTER — Ambulatory Visit: Payer: BC Managed Care – PPO | Admitting: Cardiology

## 2013-03-31 ENCOUNTER — Encounter (HOSPITAL_COMMUNITY)
Admission: RE | Disposition: A | Discharge status: Home / Self Care | Payer: Self-pay | Source: Ambulatory Visit | Attending: Internal Medicine

## 2013-03-31 ENCOUNTER — Ambulatory Visit (HOSPITAL_COMMUNITY)
Admission: RE | Admit: 2013-03-31 | Discharge: 2013-03-31 | Disposition: A | Discharge status: Home / Self Care | Payer: BC Managed Care – PPO | Source: Ambulatory Visit | Attending: Internal Medicine | Admitting: Internal Medicine

## 2013-03-31 DIAGNOSIS — I2510 Atherosclerotic heart disease of native coronary artery without angina pectoris: Secondary | ICD-10-CM | POA: Insufficient documentation

## 2013-03-31 DIAGNOSIS — Y849 Medical procedure, unspecified as the cause of abnormal reaction of the patient, or of later complication, without mention of misadventure at the time of the procedure: Secondary | ICD-10-CM | POA: Insufficient documentation

## 2013-03-31 DIAGNOSIS — I10 Essential (primary) hypertension: Secondary | ICD-10-CM | POA: Insufficient documentation

## 2013-03-31 DIAGNOSIS — I519 Heart disease, unspecified: Secondary | ICD-10-CM | POA: Insufficient documentation

## 2013-03-31 DIAGNOSIS — I4891 Unspecified atrial fibrillation: Secondary | ICD-10-CM

## 2013-03-31 DIAGNOSIS — G4730 Sleep apnea, unspecified: Secondary | ICD-10-CM | POA: Insufficient documentation

## 2013-03-31 HISTORY — PX: LOOP RECORDER IMPLANT: SHX5477

## 2013-03-31 HISTORY — PX: OTHER SURGICAL HISTORY: SHX169

## 2013-03-31 LAB — SURGICAL PCR SCREEN
MRSA, PCR: NEGATIVE
Staphylococcus aureus: NEGATIVE

## 2013-03-31 SURGERY — LOOP RECORDER IMPLANT
Anesthesia: LOCAL

## 2013-03-31 MED ORDER — LIDOCAINE HCL (PF) 1 % IJ SOLN
INTRAMUSCULAR | Status: AC
  Filled 2013-03-31: qty 30

## 2013-03-31 MED ORDER — MUPIROCIN 2 % EX OINT
TOPICAL_OINTMENT | Freq: Two times a day (BID) | CUTANEOUS | Status: DC
  Filled 2013-03-31: qty 22

## 2013-03-31 MED ORDER — MUPIROCIN 2 % EX OINT
TOPICAL_OINTMENT | CUTANEOUS | Status: AC
  Filled 2013-03-31: qty 22

## 2013-03-31 NOTE — H&P (View-Only) (Signed)
ELECTROPHYSIOLOGY CONSULT NOTE    Patient ID: Christopher [name] MRN: [id number], DOB/AGE: 63/14/1952 63 y.o.  Date of Consult: 03-31-2013  Primary Physician: Tally Due, MD Primary Cardiologist: [name], MD  Reason for Consultation: atrial fibrillation  HPI:  Christopher [name] is a 62 year old male with a past medical history significant for coronary disease s/p PCI, sleep apnea on CPAP, hypertension, and mitral valve endocarditis and prolapse s/p minimally invasive mitral valve repair in March of 2014 with post op atrial fibrillation noted in April of 2014.  He was treated with Amiodarone at that time with restoration of SR.  He was originally anticoagulated with Eliquis but was unable to tolerate this medication due to headaches.  He then developed a thigh hematoma with Xarelto.  He has had no documented recurrent atrial fibrillation.    He denies recent chest pain, shortness of breath, palpitations, dizziness, or syncope.    EP has been asked to place loop recorder to look for recurrent atrial fibrillation.   ROS is negative except as noted above.    Past Medical History  Diagnosis Date  . Hypertension   . Endocarditis 2009    Strep viridans  . Rhinitis, allergic   . Severe mitral regurgitation     a. Mitral valve prolapse with flail segment of posterior leaflet and severe MR by TEE, remote h/o bacterial endocarditis   . Coronary artery disease     a. 2/7 Cath: LM nl, LAD min irregs, LCX min irregs, RI 40, RCA 48m, EF 55-60% basal to mid inf HK, 3-4+ MR;  b. 08/25/2012 PCI of RCA with 4.0x15 [facility] BMS  . 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 Sleep apnea     NPSG 01/21/06- AHI 40.7/hr cpap  . Heart murmur   . GERD (gastroesophageal reflux disease)     hx  . Arthritis   . S/P mitral valve repair 09/27/2012    Complex valvuloplasty including triangular resection of flail posterior leaflet with 30 mm Sorin Memo 3D ring annuloplasty via right mini  thoracotomy approach     Surgical History:  Past Surgical History  Procedure Laterality Date  . Septoplasty      Dr. [name]  . Uvulopalatopharyngoplasty    . Tonsillectomy    . Foot surgery      BILATERAL TOES  . Amputation  07/28/2012    Procedure: AMPUTATION DIGIT;  Surgeon: [name], MD;  Location: [facility] ORS;  Service: Orthopedics;  Laterality: Right;  2nd toe  . Tee without cardioversion N/A 08/12/2012    Procedure: TRANSESOPHAGEAL ECHOCARDIOGRAM (TEE);  Surgeon: [name], MD;  Location: [facility] ENDOSCOPY;  Service: Cardiovascular;  Laterality: N/A;  . Cardiac catheterization    . Mitral valve repair Right 09/27/2012    Procedure: MINIMALLY INVASIVE MITRAL VALVE REPAIR (MVR);  Surgeon: [name], MD;  Location: [facility] OR;  Service: Open Heart Surgery;  Laterality: Right;  Ultrasound guided  . Intraoperative transesophageal echocardiogram N/A 09/27/2012    Procedure: INTRAOPERATIVE TRANSESOPHAGEAL ECHOCARDIOGRAM;  Surgeon: [name], MD;  Location: [facility] OR;  Service: Open Heart Surgery;  Laterality: N/A;     Prescriptions prior to admission  Medication Sig Dispense Refill  . aspirin EC 81 MG tablet Take 81 mg by mouth daily.      Marland Kitchen atorvastatin (LIPITOR) 20 MG tablet Take 20 mg by mouth daily.      . cetirizine (ZYRTEC) 10 MG tablet Take 10 mg by mouth  daily.        . Cholecalciferol (VITAMIN D) 2000 UNITS CAPS Take 2,000 Units by mouth daily.       . fluticasone (FLONASE) 50 MCG/ACT nasal spray Place 2 sprays into both nostrils daily.      . metoprolol succinate (TOPROL-XL) 100 MG 24 hr tablet Take 1 tablet (100 mg total) by mouth daily. Take with or immediately following a meal.  90 tablet  3  . ramipril (ALTACE) 10 MG capsule Take 10 mg by mouth every morning.        Inpatient Medications:  . mupirocin ointment   Nasal BID    Allergies:  Allergies  Allergen Reactions  . Codeine     Causes bad constipation  . Dust Mite Extract Cough  . Mold Extract  [Trichophyton] Cough  . Pollen Extract Cough    History   Social History  . Marital Status: Divorced    Spouse Name: N/A    Number of Children: N/A  . Years of Education: N/A   Occupational History  . Works at [employer]    Social History Main Topics  . Smoking status: Former Smoker -- 2.50 packs/day for 5 years    Types: Cigarettes    Quit date: 07/06/1978  . Smokeless tobacco: Not on file     Comment: social drinker  . Alcohol Use: Yes     Comment: OCC.  . Drug Use: No  . Sexual Activity: Yes   Other Topics Concern  . Not on file   Social History Narrative  . No narrative on file     Family History  Problem Relation Age of Onset  . Heart disease Father   . Cancer Father   . Diabetes Father   . Diabetes Mother   . Stroke Mother     Labs:   Lab Results  Component Value Date   WBC 7.0 03/09/2013   HGB 12.1* 03/09/2013   HCT 36.9* 03/09/2013   MCV 82.9 03/09/2013   PLT 217 03/09/2013      Radiology/Studies: Dg Chest 2 View 03/13/2013   *RADIOLOGY REPORT*  Clinical Data: Follow-up mitral valve repair.  CHEST - 2 VIEW  Comparison: 12/12/2012 and CT chest 12/07/2012.  Findings: Trachea is midline.  Heart size stable.  Obscuration of the right heart border is better evaluated on 12/07/2012. Lungs are otherwise clear.  No pleural fluid.  Degenerative changes are seen in the spine.  IMPRESSION:  1.  No acute findings. 2.  Obscuration of the right heart border is better evaluated on 12/07/2012.   Original Report Authenticated By: Leanna Battles, M.D.   EKG: 03-14-2013 SR with RBBB rate 77, QRS 146  Echo: 01-27-2013 EF 45-50%, LA 44  A/P 1. H/o postoperative afib/ valvular heart disease, HTN  The patient has been documented to have postoperative afib previously. His CHADS2VASC score is at least 2 (HTN, CAD).  He has not tolerated anticoagulation previously.  The question is whether or not he will have further afib going forward.  I agree with Dr [name] that close cardiac monitoring  may be the best way to approach this patient. Risks, benefits, and alteratives to ILR were dicussed at length with him today.  He would like to proceed. If he is documented to have significant afib in the future then we will recommend anticoagulation.  He will remain off of anticoagulation in the interim.  HTN Stable No change required today  Valvular heart disease- stable

## 2013-03-31 NOTE — Consult Note (Signed)
  ELECTROPHYSIOLOGY CONSULT NOTE    Patient ID: Christopher [name] MRN: [id number], DOB/AGE: 01/18/1951 62 y.o.  Date of Consult: 03-31-2013  Primary Physician: GUEST, CHRIS WARREN, MD Primary Cardiologist: Dalton McLean, MD  Reason for Consultation: atrial fibrillation  HPI:  Christopher [name] is a 62 year old male with a past medical history significant for coronary disease s/p PCI, sleep apnea on CPAP, hypertension, and mitral valve endocarditis and prolapse s/p minimally invasive mitral valve repair in March of 2014 with post op atrial fibrillation noted in April of 2014.  He was treated with Amiodarone at that time with restoration of SR.  He was originally anticoagulated with Eliquis but was unable to tolerate this medication due to headaches.  He then developed a thigh hematoma with Xarelto.  He has had no documented recurrent atrial fibrillation.    He denies recent chest pain, shortness of breath, palpitations, dizziness, or syncope.    EP has been asked to place loop recorder to look for recurrent atrial fibrillation.   ROS is negative except as noted above.    Past Medical History  Diagnosis Date  . Hypertension   . Endocarditis 2009    Strep viridans  . Rhinitis, allergic   . Severe mitral regurgitation     a. Mitral valve prolapse with flail segment of posterior leaflet and severe MR by TEE, remote h/o bacterial endocarditis   . Coronary artery disease     a. 2/7 Cath: LM nl, LAD min irregs, LCX min irregs, RI 40, RCA 95m, EF 55-60% basal to mid inf HK, 3-4+ MR;  b. 08/25/2012 PCI of RCA with 4.0x15 [facility] BMS  . 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 Sleep apnea     NPSG 01/21/06- AHI 40.7/hr cpap  . Heart murmur   . GERD (gastroesophageal reflux disease)     hx  . Arthritis   . S/P mitral valve repair 09/27/2012    Complex valvuloplasty including triangular resection of flail posterior leaflet with 30 mm Sorin Memo 3D ring annuloplasty via right mini  thoracotomy approach     Surgical History:  Past Surgical History  Procedure Laterality Date  . Septoplasty      Dr. Crossley  . Uvulopalatopharyngoplasty    . Tonsillectomy    . Foot surgery      BILATERAL TOES  . Amputation  07/28/2012    Procedure: AMPUTATION DIGIT;  Surgeon: Paul A Bednarz, MD;  Location: [facility] ORS;  Service: Orthopedics;  Laterality: Right;  2nd toe  . Tee without cardioversion N/A 08/12/2012    Procedure: TRANSESOPHAGEAL ECHOCARDIOGRAM (TEE);  Surgeon: Dalton S McLean, MD;  Location: [facility] ENDOSCOPY;  Service: Cardiovascular;  Laterality: N/A;  . Cardiac catheterization    . Mitral valve repair Right 09/27/2012    Procedure: MINIMALLY INVASIVE MITRAL VALVE REPAIR (MVR);  Surgeon: Clarence H Owen, MD;  Location: [facility] OR;  Service: Open Heart Surgery;  Laterality: Right;  Ultrasound guided  . Intraoperative transesophageal echocardiogram N/A 09/27/2012    Procedure: INTRAOPERATIVE TRANSESOPHAGEAL ECHOCARDIOGRAM;  Surgeon: Clarence H Owen, MD;  Location: [facility] OR;  Service: Open Heart Surgery;  Laterality: N/A;     Prescriptions prior to admission  Medication Sig Dispense Refill  . aspirin EC 81 MG tablet Take 81 mg by mouth daily.      . atorvastatin (LIPITOR) 20 MG tablet Take 20 mg by mouth daily.      . cetirizine (ZYRTEC) 10 MG tablet Take 10 mg by mouth   daily.        . Cholecalciferol (VITAMIN D) 2000 UNITS CAPS Take 2,000 Units by mouth daily.       . fluticasone (FLONASE) 50 MCG/ACT nasal spray Place 2 sprays into both nostrils daily.      . metoprolol succinate (TOPROL-XL) 100 MG 24 hr tablet Take 1 tablet (100 mg total) by mouth daily. Take with or immediately following a meal.  90 tablet  3  . ramipril (ALTACE) 10 MG capsule Take 10 mg by mouth every morning.        Inpatient Medications:  . mupirocin ointment   Nasal BID    Allergies:  Allergies  Allergen Reactions  . Codeine     Causes bad constipation  . Dust Mite Extract Cough  . Mold Extract  [Trichophyton] Cough  . Pollen Extract Cough    History   Social History  . Marital Status: Divorced    Spouse Name: N/A    Number of Children: N/A  . Years of Education: N/A   Occupational History  . Works at [name]    Social History Main Topics  . Smoking status: Former Smoker -- 2.50 packs/day for 5 years    Types: Cigarettes    Quit date: 07/06/1978  . Smokeless tobacco: Not on file     Comment: social drinker  . Alcohol Use: Yes     Comment: OCC.  . Drug Use: No  . Sexual Activity: Yes   Other Topics Concern  . Not on file   Social History Narrative  . No narrative on file     Family History  Problem Relation Age of Onset  . Heart disease Father   . Cancer Father   . Diabetes Father   . Diabetes Mother   . Stroke Mother     Labs:   Lab Results  Component Value Date   WBC 7.0 03/09/2013   HGB 12.1* 03/09/2013   HCT 36.9* 03/09/2013   MCV 82.9 03/09/2013   PLT 217 03/09/2013      Radiology/Studies: Dg Chest 2 View 03/13/2013   *RADIOLOGY REPORT*  Clinical Data: Follow-up mitral valve repair.  CHEST - 2 VIEW  Comparison: 12/12/2012 and CT chest 12/07/2012.  Findings: Trachea is midline.  Heart size stable.  Obscuration of the right heart border is better evaluated on 12/07/2012. Lungs are otherwise clear.  No pleural fluid.  Degenerative changes are seen in the spine.  IMPRESSION:  1.  No acute findings. 2.  Obscuration of the right heart border is better evaluated on 12/07/2012.   Original Report Authenticated By: Melinda Blietz, M.D.   EKG: 03-14-2013 SR with RBBB rate 77, QRS 146  Echo: 01-27-2013 EF 45-50%, LA 44  A/P 1. H/o postoperative afib/ valvular heart disease, HTN  The patient has been documented to have postoperative afib previously. His CHADS2VASC score is at least 2 (HTN, CAD).  He has not tolerated anticoagulation previously.  The question is whether or not he will have further afib going forward.  I agree with Dr McLean that close cardiac monitoring  may be the best way to approach this patient. Risks, benefits, and alteratives to ILR were dicussed at length with him today.  He would like to proceed. If he is documented to have significant afib in the future then we will recommend anticoagulation.  He will remain off of anticoagulation in the interim.  HTN Stable No change required today  Valvular heart disease- stable  

## 2013-03-31 NOTE — Interval H&P Note (Signed)
History and Physical Interval Note:  03/31/2013 7:46 AM  Christopher [name]  has presented today for surgery, with the diagnosis of Syncope  The various methods of treatment have been discussed with the patient and family. After consideration of risks, benefits and other options for treatment, the patient has consented to  Procedure(s): LOOP RECORDER IMPLANT (N/A) as a surgical intervention .  The patient's history has been reviewed, patient examined, no change in status, stable for surgery.  I have reviewed the patient's chart and labs.  Questions were answered to the patient's satisfaction.     [name] Range

## 2013-03-31 NOTE — Op Note (Signed)
SURGEON:  [name] Range, MD     PREPROCEDURE DIAGNOSIS:   Atrial fibrillation    POSTPROCEDURE DIAGNOSIS: Atrial fibrillation     PROCEDURES:   1. Implantable loop recorder implantation    INTRODUCTION:  Christopher [name] is a 62 y.o. male with a history of post operative afib after valve surgery who presents today for implantable loop implantation.  The patient has had afib only documented postoperatively previously.  He has not tolerated anticoagulation.  His CHADS2VASC score is at least 2.  It is felt that afib monitoring is important to guide future anticoagulation.  He will not be anticoagulated unless afib recurs. The patient therefore presents today for implantable loop implantation.     DESCRIPTION OF PROCEDURE:  Informed written consent was obtained, and the patient was brought to the electrophysiology lab in a fasting state.  The patient required no sedation for the procedure today.  Mapping over the patient's chest was performed by the EP lab staff to identify the area where electrograms were most prominent for ILR recording.  This area was found to be the left parasternal region over the 3rd-4th intercostal space. The patients left chest was therefore prepped and draped in the usual sterile fashion by the EP lab staff. The skin overlying the left parasternal region was infiltrated with lidocaine for local analgesia.  A 0.5-cm incision was made over the left parasternal region over the 3rd intercostal space.  A subcutaneous ILR pocket was fashioned using a combination of sharp and blunt dissection.  A Medtronic Reveal [place] model [id number] implantable loop recorder was then placed into the pocket  R waves were very prominent and measured 0.34mV.  Steri- Strips and a sterile dressing were then applied.  There were no early apparent complications.     CONCLUSIONS:   1. Successful implantation of a Medtronic Reveal LINQ implantable loop recorder for atrial fibrillation monitoring  2. No  early apparent complications.

## 2013-04-10 ENCOUNTER — Ambulatory Visit (INDEPENDENT_AMBULATORY_CARE_PROVIDER_SITE_OTHER): Payer: BC Managed Care – PPO | Admitting: *Deleted

## 2013-04-10 DIAGNOSIS — I4891 Unspecified atrial fibrillation: Secondary | ICD-10-CM

## 2013-04-10 LAB — PACEMAKER DEVICE OBSERVATION

## 2013-04-10 NOTE — Progress Notes (Signed)
Wound check ILR in office. 

## 2013-04-11 ENCOUNTER — Other Ambulatory Visit: Payer: Self-pay | Admitting: Cardiology

## 2013-04-13 ENCOUNTER — Encounter: Payer: Self-pay | Admitting: Cardiology

## 2013-04-27 ENCOUNTER — Encounter: Payer: Self-pay | Admitting: Internal Medicine

## 2013-05-03 ENCOUNTER — Encounter: Payer: Self-pay | Admitting: Family Medicine

## 2013-05-03 ENCOUNTER — Ambulatory Visit (INDEPENDENT_AMBULATORY_CARE_PROVIDER_SITE_OTHER): Payer: BC Managed Care – PPO | Admitting: Family Medicine

## 2013-05-03 VITALS — BP 156/92 | HR 72 | Temp 97.4°F | Resp 16 | Ht 70.0 in | Wt 218.0 lb

## 2013-05-03 DIAGNOSIS — Z0000 Encounter for general adult medical examination without abnormal findings: Secondary | ICD-10-CM

## 2013-05-03 DIAGNOSIS — K5900 Constipation, unspecified: Secondary | ICD-10-CM

## 2013-05-03 DIAGNOSIS — R7309 Other abnormal glucose: Secondary | ICD-10-CM

## 2013-05-03 DIAGNOSIS — R739 Hyperglycemia, unspecified: Secondary | ICD-10-CM

## 2013-05-03 DIAGNOSIS — I83893 Varicose veins of bilateral lower extremities with other complications: Secondary | ICD-10-CM

## 2013-05-03 LAB — LIPID PANEL
Cholesterol: 128 mg/dL (ref 0–200)
HDL: 47 mg/dL (ref >39)
LDL Cholesterol: 57 mg/dL (ref 0–99)
Total CHOL/HDL Ratio: 2.7 Ratio
Triglycerides: 120 mg/dL (ref <150)
VLDL: 24 mg/dL (ref 0–40)

## 2013-05-03 LAB — BASIC METABOLIC PANEL
BUN: 15 mg/dL (ref 6–23)
CO2: 27 mEq/L (ref 19–32)
Calcium: 8.9 mg/dL (ref 8.4–10.5)
Chloride: 104 mEq/L (ref 96–112)
Creat: 1.07 mg/dL (ref 0.50–1.35)
Glucose, Bld: 96 mg/dL (ref 70–99)
Potassium: 3.8 mEq/L (ref 3.5–5.3)
Sodium: 139 mEq/L (ref 135–145)

## 2013-05-03 LAB — POCT URINALYSIS DIPSTICK
Blood, UA: NEGATIVE
Glucose, UA: NEGATIVE
Leukocytes, UA: NEGATIVE
Nitrite, UA: NEGATIVE
Spec Grav, UA: 1.025
Urobilinogen, UA: 0.2
pH, UA: 6

## 2013-05-03 LAB — ALT: ALT: 18 U/L (ref 0–53)

## 2013-05-03 LAB — POCT GLYCOSYLATED HEMOGLOBIN (HGB A1C): Hemoglobin A1C: 5.2

## 2013-05-03 LAB — IFOBT (OCCULT BLOOD): IFOBT: NEGATIVE

## 2013-05-03 MED ORDER — POLYETHYLENE GLYCOL 3350 17 G PO PACK: 17.0000 g | PACK | Freq: Every day | ORAL | Status: DC

## 2013-05-03 NOTE — Progress Notes (Signed)
Subjective:    Patient ID: Christopher [name], male    DOB: 1951-02-13, 62 y.o.   MRN: [id number]  HPI  This 62 y.o. Cauc male is here for annual CPE. He has had cardiac surgery/mitral valve repair and reports feeling much better. He has sleep apnea and uses CPAP w/ pressure set at 10 (per pt). Pt is compliant w/ medications and reports no adverse effects. His main concern is constipation, onset after using codeine prescribed in August 2014 for hip pain, diagnosed as sciatica. Since that visit, pt has seen orthopedist; does not have sciatic nerve problem and no longer has hip pain. He has hard stools, difficult to pass and occuring every 3-4 days. Last colonoscopy in 2009; due for repeat this year (had a hyperplastic polyp).    Review of Systems  Respiratory: Positive for apnea.        Uses CPAP.  Gastrointestinal: Positive for constipation.       Water intake= 1 quart daily. Drinks 3-4 diet sodas daily.  All other systems reviewed and are negative.       Objective:   Physical Exam  Constitutional: He is oriented to person, place, and time. He appears well-developed and well-nourished. No distress.  HENT:  Head: Normocephalic and atraumatic.  Right Ear: Hearing, external ear and ear canal normal. Tympanic membrane is scarred.  Left Ear: Hearing, external ear and ear canal normal. Tympanic membrane is scarred.  Nose: Nose normal. No nasal deformity or septal deviation.  Mouth/Throat: Uvula is midline, oropharynx is clear and moist and mucous membranes are normal. No oral lesions. Normal dentition. No dental caries.  Cardiovascular: Normal rate, regular rhythm, S1 normal, S2 normal and normal heart sounds.   No extrasystoles are present. PMI is not displaced.  Exam reveals no gallop and no friction rub.   No murmur heard. Pulses:      Radial pulses are 1+ on the right side, and 1+ on the left side.       Femoral pulses are 1+ on the right side, and 1+ on the left side.      Dorsalis pedis  pulses are 1+ on the right side, and 1+ on the left side.       Posterior tibial pulses are 1+ on the right side, and 1+ on the left side.  R lower extremity- ropey varicose vein, tender to touch w/o ulceration.  Pulmonary/Chest: Effort normal and breath sounds normal. No respiratory distress.  Abdominal: Soft. Normal appearance and bowel sounds are normal. He exhibits no distension, no pulsatile midline mass and no mass. There is no hepatosplenomegaly. There is no tenderness. There is no guarding and no CVA tenderness. No hernia.  Genitourinary: Rectum normal and prostate normal. Rectal exam shows no external hemorrhoid, no fissure, no mass, no tenderness and anal tone normal. Guaiac negative stool. Prostate is not enlarged and not tender.  Musculoskeletal: Normal range of motion. He exhibits no edema and no tenderness.       Feet:  Lymphadenopathy:       Head (right side): No submental, no submandibular, no tonsillar, no posterior auricular and no occipital adenopathy present.       Head (left side): No submental, no submandibular, no tonsillar, no posterior auricular and no occipital adenopathy present.    He has no cervical adenopathy.       Right: No inguinal and no supraclavicular adenopathy present.       Left: No inguinal and no supraclavicular adenopathy present.  Neurological: He  is alert and oriented to person, place, and time. He has normal strength. He displays no atrophy. No cranial nerve deficit or sensory deficit. He exhibits normal muscle tone. He displays a negative Romberg sign. Coordination and gait normal.  Reflex Scores:      Tricep reflexes are 1+ on the right side and 1+ on the left side.      Bicep reflexes are 1+ on the right side and 1+ on the left side.      Patellar reflexes are 2+ on the right side and 2+ on the left side. Skin: Skin is warm, dry and intact. No ecchymosis and no rash noted. He is not diaphoretic. No cyanosis or erythema. No pallor. Nails show no  clubbing.  Psychiatric: He has a normal mood and affect. His speech is normal and behavior is normal. Judgment and thought content normal. Cognition and memory are normal.     Results for orders placed in visit on 05/03/13  POCT URINALYSIS DIPSTICK      Result Value Range   Color, UA yellow     Clarity, UA clear     Glucose, UA neg     Bilirubin, UA small     Ketones, UA trace     Spec Grav, UA 1.025     Blood, UA neg     pH, UA 6.0     Protein, UA trace     Urobilinogen, UA 0.2     Nitrite, UA neg     Leukocytes, UA Negative    POCT GLYCOSYLATED HEMOGLOBIN (HGB A1C)      Result Value Range   Hemoglobin A1C 5.2    IFOBT (OCCULT BLOOD)      Result Value Range   IFOBT Negative          Assessment & Plan:  Routine general medical examination at a health care facility - Plan: POCT urinalysis dipstick, PSA, Lipid panel, ALT, Basic metabolic panel  Unspecified constipation - Trial Miralax once a day x 2 weeks. Increased water and fiber in diet. Plan: IFOBT POC (occult bld, rslt in office), Ambulatory referral to Gastroenterology  Hyperglycemia - Pt advised that he does not have DM but does need to lose weight. Pt at risk for DM due to family hx and truncal obesity.         Plan: Lipid panel, POCT glycosylated hemoglobin (Hb A1C)  Varicose veins of lower extremities with other complications - Plan: Consult to vascular surgery

## 2013-05-03 NOTE — Patient Instructions (Signed)
Keeping you healthy  Get these tests  Blood pressure- Have your blood pressure checked once a year by your healthcare provider.  Normal blood pressure is 120/80  Weight- Have your body mass index (BMI) calculated to screen for obesity.  BMI is a measure of body fat based on height and weight. You can also calculate your own BMI at [email or web address].  Cholesterol- Have your cholesterol checked every year.  Diabetes- Have your blood sugar checked regularly if you have high blood pressure, high cholesterol, have a family history of diabetes or if you are overweight.  Screening for Colon Cancer- Colonoscopy starting at age 91.  Screening may begin sooner depending on your family history and other health conditions. Follow up colonoscopy as directed by your Gastroenterologist.  Screening for Prostate Cancer- Both blood work (PSA) and a rectal exam help screen for Prostate Cancer.  Screening begins at age 55 with African-American men and at age 70 with Caucasian men.  Screening may begin sooner depending on your family history.  Take these medicines  Aspirin- One aspirin daily can help prevent Heart disease and Stroke.  Flu shot- Every fall.  Tetanus- Every 10 years.  Zostavax- Once after the age of 24 to prevent Shingles.  Pneumonia shot- Once after the age of 33; if you are younger than 69, ask your healthcare provider if you need a Pneumonia shot.  Take these steps  Don't smoke- If you do smoke, talk to your doctor about quitting.  For tips on how to quit, go to www.smokefree.gov or call 1-800-QUIT-NOW.  Be physically active- Exercise 5 days a week for at least 30 minutes.  If you are not already physically active start slow and gradually work up to 30 minutes of moderate physical activity.  Examples of moderate activity include walking briskly, mowing the yard, dancing, swimming, bicycling, etc.  Eat a healthy diet- Eat a variety of healthy food such as fruits, vegetables, low  fat milk, low fat cheese, yogurt, lean meant, poultry, fish, beans, tofu, etc. For more information go to www.thenutritionsource.org  Drink alcohol in moderation- Limit alcohol intake to less than two drinks a day. Never drink and drive.  Dentist- Brush and floss twice daily; visit your dentist twice a year.  Depression- Your emotional health is as important as your physical health. If you're feeling down, or losing interest in things you would normally enjoy please talk to your healthcare provider.  Eye exam- Visit your eye doctor every year.  Safe sex- If you may be exposed to a sexually transmitted infection, use a condom.  Seat belts- Seat belts can save your life; always wear one.  Smoke/Carbon Monoxide detectors- These detectors need to be installed on the appropriate level of your home.  Replace batteries at least once a year.  Skin cancer- When out in the sun, cover up and use sunscreen 15 SPF or higher.  Violence- If anyone is threatening you, please tell your healthcare provider.  Living Will/ Health care power of attorney- Speak with your healthcare provider and family.     Constipation, Adult Constipation is when a person has fewer than 3 bowel movements a week; has difficulty having a bowel movement; or has stools that are dry, hard, or larger than normal. As people grow older, constipation is more common. If you try to fix constipation with medicines that make you have a bowel movement (laxatives), the problem may get worse. Long-term laxative use may cause the muscles of the colon to become weak. A  low-fiber diet, not taking in enough fluids, and taking certain medicines may make constipation worse. CAUSES   Certain medicines, such as antidepressants, pain medicine, iron supplements, antacids, and water pills.   Certain diseases, such as diabetes, irritable bowel syndrome (IBS), thyroid disease, or depression.   Not drinking enough water.   Not eating enough  fiber-rich foods.   Stress or travel.  Lack of physical activity or exercise.  Not going to the restroom when there is the urge to have a bowel movement.  Ignoring the urge to have a bowel movement.  Using laxatives too much. SYMPTOMS   Having fewer than 3 bowel movements a week.   Straining to have a bowel movement.   Having hard, dry, or larger than normal stools.   Feeling full or bloated.   Pain in the lower abdomen.  Not feeling relief after having a bowel movement. DIAGNOSIS  Your caregiver will take a medical history and perform a physical exam. Further testing may be done for severe constipation. Some tests may include:   A barium enema X-ray to examine your rectum, colon, and sometimes, your small intestine.  A sigmoidoscopy to examine your lower colon.  A colonoscopy to examine your entire colon. TREATMENT  Treatment will depend on the severity of your constipation and what is causing it. Some dietary treatments include drinking more fluids and eating more fiber-rich foods. Lifestyle treatments may include regular exercise. If these diet and lifestyle recommendations do not help, your caregiver may recommend taking over-the-counter laxative medicines to help you have bowel movements. Prescription medicines may be prescribed if over-the-counter medicines do not work.  HOME CARE INSTRUCTIONS   Increase dietary fiber in your diet, such as fruits, vegetables, whole grains, and beans. Limit high-fat and processed sugars in your diet, such as [place] fries, hamburgers, cookies, candies, and soda.   A fiber supplement may be added to your diet if you cannot get enough fiber from foods.   Drink enough fluids to keep your urine clear or pale yellow.   Exercise regularly or as directed by your caregiver.   Go to the restroom when you have the urge to go. Do not hold it.  Only take medicines as directed by your caregiver. Do not take other medicines for  constipation without talking to your caregiver first. SEEK IMMEDIATE MEDICAL CARE IF:   You have bright red blood in your stool.   Your constipation lasts for more than 4 days or gets worse.   You have abdominal or rectal pain.   You have thin, pencil-like stools.  You have unexplained weight loss. MAKE SURE YOU:   Understand these instructions.  Will watch your condition.  Will get help right away if you are not doing well or get worse. Document Released: 03/20/2004 Document Revised: 09/14/2011 Document Reviewed: 05/26/2011 [facility] Patient Information 2014 [place], [place].    Varicose Veins Varicose veins are veins that have become enlarged and twisted. CAUSES This condition is the result of valves in the veins not working properly. Valves in the veins help return blood from the leg to the heart. If these valves are damaged, blood flows backwards and backs up into the veins in the leg near the skin. This causes the veins to become larger. People who are on their feet a lot, who are pregnant, or who are overweight are more likely to develop varicose veins. SYMPTOMS   Bulging, twisted-appearing, bluish veins, most commonly found on the legs.  Leg pain or a feeling  of heaviness. These symptoms may be worse at the end of the day.  Leg swelling.  Skin color changes. DIAGNOSIS  Varicose veins can usually be diagnosed with an exam of your legs by your caregiver. He or she may recommend an ultrasound of your leg veins. TREATMENT  Most varicose veins can be treated at home.However, other treatments are available for people who have persistent symptoms or who want to treat the cosmetic appearance of the varicose veins. These include:  Laser treatment of very small varicose veins.  Medicine that is shot (injected) into the vein. This medicine hardens the walls of the vein and closes off the vein. This treatment is called sclerotherapy. Afterwards, you may need to wear clothing  or bandages that apply pressure.  Surgery. HOME CARE INSTRUCTIONS   Do not stand or sit in one position for long periods of time. Do not sit with your legs crossed. Rest with your legs raised during the day.  Wear elastic stockings or support hose. Do not wear other tight, encircling garments around the legs, pelvis, or waist.  Walk as much as possible to increase blood flow.  Raise the foot of your bed at night with 2-inch blocks.  If you get a cut in the skin over the vein and the vein bleeds, lie down with your leg raised and press on it with a clean cloth until the bleeding stops. Then place a bandage (dressing) on the cut. See your caregiver if it continues to bleed or needs stitches. SEEK MEDICAL CARE IF:   The skin around your ankle starts to break down.  You have pain, redness, tenderness, or hard swelling developing in your leg over a vein.  You are uncomfortable due to leg pain. Document Released: 04/01/2005 Document Revised: 09/14/2011 Document Reviewed: 08/18/2010 [facility] Patient Information 2014 [place], [place].

## 2013-05-04 ENCOUNTER — Ambulatory Visit (INDEPENDENT_AMBULATORY_CARE_PROVIDER_SITE_OTHER): Payer: BC Managed Care – PPO | Admitting: *Deleted

## 2013-05-04 DIAGNOSIS — I4891 Unspecified atrial fibrillation: Secondary | ICD-10-CM

## 2013-05-04 LAB — PSA: PSA: 0.99 ng/mL (ref <=4.00)

## 2013-05-06 ENCOUNTER — Encounter: Payer: Self-pay | Admitting: Family Medicine

## 2013-05-09 LAB — PACEMAKER DEVICE OBSERVATION

## 2013-05-19 ENCOUNTER — Encounter: Payer: Self-pay | Admitting: Internal Medicine

## 2013-06-23 ENCOUNTER — Ambulatory Visit (INDEPENDENT_AMBULATORY_CARE_PROVIDER_SITE_OTHER): Payer: BC Managed Care – PPO | Admitting: *Deleted

## 2013-06-23 DIAGNOSIS — I4891 Unspecified atrial fibrillation: Secondary | ICD-10-CM

## 2013-07-07 ENCOUNTER — Ambulatory Visit (INDEPENDENT_AMBULATORY_CARE_PROVIDER_SITE_OTHER): Payer: BC Managed Care – PPO | Admitting: Emergency Medicine

## 2013-07-07 VITALS — BP 120/80 | HR 97 | Temp 98.8°F | Resp 16 | Ht 70.0 in | Wt 227.0 lb

## 2013-07-07 DIAGNOSIS — H66009 Acute suppurative otitis media without spontaneous rupture of ear drum, unspecified ear: Secondary | ICD-10-CM

## 2013-07-07 DIAGNOSIS — J018 Other acute sinusitis: Secondary | ICD-10-CM

## 2013-07-07 MED ORDER — AMOXICILLIN-POT CLAVULANATE 875-125 MG PO TABS: 1.0000 | ORAL_TABLET | Freq: Two times a day (BID) | ORAL | Status: DC

## 2013-07-07 MED ORDER — PSEUDOEPHEDRINE-GUAIFENESIN ER 60-600 MG PO TB12: 1.0000 | ORAL_TABLET | Freq: Two times a day (BID) | ORAL | Status: DC

## 2013-07-07 NOTE — Patient Instructions (Signed)
Otitis Media, Adult A middle ear infection is an infection in the space behind the eardrum. The medical name for this is "otitis media." It may happen after a common cold. It is caused by a germ that starts growing in that space. You may feel swollen glands in your neck on the side of the ear infection. HOME CARE INSTRUCTIONS   Take your medicine as directed until it is gone, even if you feel better after the first few days.  Only take over-the-counter or prescription medicines for pain, discomfort, or fever as directed by your caregiver.  Occasional use of a nasal decongestant a couple times per day may help with discomfort and help the eustachian tube to drain better. Follow up with your caregiver in 10 to 14 days or as directed, to be certain that the infection has cleared. Not keeping the appointment could result in a chronic or permanent injury, pain, hearing loss and disability. If there is any problem keeping the appointment, you must call back to this facility for assistance. SEEK IMMEDIATE MEDICAL CARE IF:   You are not getting better in 2 to 3 days.  You have pain that is not controlled with medication.  You feel worse instead of better.  You cannot use the medication as directed.  You develop swelling, redness or pain around the ear or stiffness in your neck. MAKE SURE YOU:   Understand these instructions.  Will watch your condition.  Will get help right away if you are not doing well or get worse. Document Released: 03/27/2004 Document Revised: 09/14/2011 Document Reviewed: 01/17/2013 [facility] Patient Information 2014 [place], [place].

## 2013-07-07 NOTE — Progress Notes (Signed)
[facility] [address] [place] [address] [phone]  Date:  07/07/2013   Name:  Christopher [name]   DOB:  10-09-1950   MRN:  [id number]  PCP:  Kennon Portela, MD    Chief Complaint: Ear Fullness   History of Present Illness:  Christopher [name] is a 63 y.o. very pleasant male patient who presents with the following:  Ill with pain in ear, nasal congestion and drainage.  Little cough.  No wheezing or shortness of breath.  Hoarse with sore throat  Ill since Wednesday night.  No rash or fever or chills.  No stool change. Pain and fullness in ears worse on left.  No improvement with over the counter medications or other home remedies. Denies other complaint or health concern today.   Patient Active Problem List   Diagnosis Date Noted  . Chronic systolic CHF (congestive heart failure) 10/25/2012  . Atrial fibrillation 10/25/2012  . Long term (current) use of anticoagulants 10/04/2012  . S/P mitral valve repair 09/27/2012  . Hyperlipidemia 09/04/2012  . Unstable angina 08/25/2012  . Severe mitral regurgitation   . Coronary artery disease 08/12/2012  . HYPERTENSION 09/27/2008  . ALLERGIC RHINITIS 09/27/2008  . SLEEP APNEA 09/27/2008    Past Medical History  Diagnosis Date  . Hypertension   . Endocarditis 2009    Strep viridans  . Rhinitis, allergic   . Severe mitral regurgitation     a. Mitral valve prolapse with flail segment of posterior leaflet and severe MR by TEE, remote h/o bacterial endocarditis   . Coronary artery disease     a. 2/7 Cath: LM nl, LAD min irregs, LCX min irregs, RI 40, RCA 73m, EF 55-60% basal to mid inf HK, 3-4+ MR;  b. 08/25/2012 PCI of RCA with 4.0x15 Vision BMS  . 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 Sleep apnea     NPSG 01/21/06- AHI 40.7/hr cpap  . Heart murmur   . GERD (gastroesophageal reflux disease)     hx  . Arthritis   . S/P mitral valve repair 09/27/2012    Complex valvuloplasty  including triangular resection of flail posterior leaflet with 30 mm Sorin Memo 3D ring annuloplasty via right mini thoracotomy approach    Past Surgical History  Procedure Laterality Date  . Septoplasty      Dr. [name]  . Uvulopalatopharyngoplasty    . Tonsillectomy    . Foot surgery      BILATERAL TOES  . Amputation  07/28/2012    Procedure: AMPUTATION DIGIT;  Surgeon: Colin Rhein, MD;  Location: [facility];  Service: Orthopedics;  Laterality: Right;  2nd toe  . Tee without cardioversion N/A 08/12/2012    Procedure: TRANSESOPHAGEAL ECHOCARDIOGRAM (TEE);  Surgeon: [name], MD;  Location: [facility];  Service: Cardiovascular;  Laterality: N/A;  . Cardiac catheterization    . Mitral valve repair Right 09/27/2012    Procedure: MINIMALLY INVASIVE MITRAL VALVE REPAIR (MVR);  Surgeon: [name], MD;  Location: [facility];  Service: Open Heart Surgery;  Laterality: Right;  Ultrasound guided  . Intraoperative transesophageal echocardiogram N/A 09/27/2012    Procedure: INTRAOPERATIVE TRANSESOPHAGEAL ECHOCARDIOGRAM;  Surgeon: [name], MD;  Location: [facility];  Service: Open Heart Surgery;  Laterality: N/A;    History  Substance Use Topics  . Smoking status: Former Smoker -- 2.50 packs/day for 5 years    Types: Cigarettes    Quit date: 07/06/1978  .  Smokeless tobacco: Not on file     Comment: social drinker  . Alcohol Use: Yes     Comment: OCC.    Family History  Problem Relation Age of Onset  . Heart disease Father   . Cancer Father   . Diabetes Father   . Diabetes Mother   . Stroke Mother     Allergies  Allergen Reactions  . Codeine     Causes bad constipation  . Dust Mite Extract Cough  . Mold Extract [Trichophyton] Cough  . Pollen Extract Cough    Medication list has been reviewed and updated.  Current Outpatient Prescriptions on File Prior to Visit  Medication Sig Dispense Refill  . aspirin EC 81 MG tablet Take 81 mg by mouth daily.      Marland Kitchen atorvastatin  (LIPITOR) 20 MG tablet Take 20 mg by mouth daily.      . cetirizine (ZYRTEC) 10 MG tablet Take 10 mg by mouth daily.        . Cholecalciferol (VITAMIN D) 2000 UNITS CAPS Take 2,000 Units by mouth daily.       . fluticasone (FLONASE) 50 MCG/ACT nasal spray Place 2 sprays into both nostrils daily.      . metoprolol succinate (TOPROL-XL) 100 MG 24 hr tablet Take 1 tablet (100 mg total) by mouth daily. Take with or immediately following a meal.  90 tablet  3  . ramipril (ALTACE) 10 MG capsule TAKE 1 CAPSULE (10 MG TOTAL) BY MOUTH DAILY.  90 capsule  1   No current facility-administered medications on file prior to visit.    Review of Systems:  As per HPI, otherwise negative.    Physical Examination: Filed Vitals:   07/07/13 1013  BP: 120/80  Pulse: 97  Temp: 98.8 F (37.1 C)  Resp: 16   Filed Vitals:   07/07/13 1013  Height: 5\' 10"  (1.778 m)  Weight: 227 lb (102.967 kg)   Body mass index is 32.57 kg/(m^2). Ideal Body Weight: Weight in (lb) to have BMI = 25: 173.9  GEN: WDWN, NAD, Non-toxic, A & O x 3 HEENT: Atraumatic, Normocephalic. Neck supple. No masses, No LAD. Ears and Nose: No external deformity.  Left TM dull red and bulging CV: RRR, No M/G/R. No JVD. No thrill. No extra heart sounds. PULM: CTA B, no wheezes, crackles, rhonchi. No retractions. No resp. distress. No accessory muscle use. ABD: S, NT, ND, +BS. No rebound. No HSM. EXTR: No c/c/e NEURO Normal gait.  PSYCH: Normally interactive. Conversant. Not depressed or anxious appearing.  Calm demeanor.    Assessment and Plan: Left otitis media augmentn mucinex d   Signed,  Ellison Carwin, MD

## 2013-07-13 ENCOUNTER — Ambulatory Visit (INDEPENDENT_AMBULATORY_CARE_PROVIDER_SITE_OTHER): Payer: BC Managed Care – PPO | Admitting: Internal Medicine

## 2013-07-13 ENCOUNTER — Encounter: Payer: Self-pay | Admitting: Internal Medicine

## 2013-07-13 VITALS — BP 138/89 | HR 91 | Ht 70.0 in | Wt 229.8 lb

## 2013-07-13 DIAGNOSIS — Z9889 Other specified postprocedural states: Secondary | ICD-10-CM

## 2013-07-13 DIAGNOSIS — I10 Essential (primary) hypertension: Secondary | ICD-10-CM

## 2013-07-13 DIAGNOSIS — I4891 Unspecified atrial fibrillation: Secondary | ICD-10-CM

## 2013-07-13 LAB — MDC_IDC_ENUM_SESS_TYPE_INCLINIC

## 2013-07-13 NOTE — Patient Instructions (Addendum)
Your physician recommends that you schedule a follow-up appointment in 4 months with Dr Aundra Dubin and as needed with Dr Rayann Heman   Leave home monitor plugged in at home.  If any atrial fibrillation is detected, we will inform you.

## 2013-07-13 NOTE — Progress Notes (Signed)
PCP:  Kennon Portela, MD Primary Cardiologist: Loralie Champagne, MD  The patient presents today for routine electrophysiology followup.  Since last being seen in our clinic, the patient reports doing very well. He is s/p ILR implantation to monitor for atrial fibrillation and help determine need for anticoagulation. Since device implantation 03/2013, he has had no atrial fibrillation documented by his [facility].  He is complaint with home monitoring.   Today, he denies symptoms of palpitations, chest pain, shortness of breath, orthopnea, PND, lower extremity edema, dizziness, presyncope, syncope, or neurologic sequela.  The patient feels that he is tolerating medications without difficulties and is otherwise without complaint today.   Past Medical History  Diagnosis Date  . Hypertension   . Endocarditis 2009    Strep viridans  . Rhinitis, allergic   . Severe mitral regurgitation     a. Mitral valve prolapse with flail segment of posterior leaflet and severe MR by TEE, remote h/o bacterial endocarditis   . Coronary artery disease     a. 2/7 Cath: LM nl, LAD min irregs, LCX min irregs, RI 40, RCA 30m, EF 55-60% basal to mid inf HK, 3-4+ MR;  b. 08/25/2012 PCI of RCA with 4.0x15 Vision BMS  . 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 Sleep apnea     NPSG 01/21/06- AHI 40.7/hr cpap  . Heart murmur   . GERD (gastroesophageal reflux disease)     hx  . Arthritis   . 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 Septoplasty      Dr. [name]  . Uvulopalatopharyngoplasty    . Tonsillectomy    . Foot surgery      BILATERAL TOES  . Amputation  07/28/2012    Procedure: AMPUTATION DIGIT;  Surgeon: Colin Rhein, MD;  Location: [facility];  Service: Orthopedics;  Laterality:  Right;  2nd toe  . Tee without cardioversion N/A 08/12/2012    Procedure: TRANSESOPHAGEAL ECHOCARDIOGRAM (TEE);  Surgeon: [name], MD;  Location: [facility];  Service: Cardiovascular;  Laterality: N/A;  . Cardiac catheterization    . Mitral valve repair Right 09/27/2012    Procedure: MINIMALLY INVASIVE MITRAL VALVE REPAIR (MVR);  Surgeon: [name], MD;  Location: [facility];  Service: Open Heart Surgery;  Laterality: Right;  Ultrasound guided  . Intraoperative transesophageal echocardiogram N/A 09/27/2012    Procedure: INTRAOPERATIVE TRANSESOPHAGEAL ECHOCARDIOGRAM;  Surgeon: [name], MD;  Location: [facility];  Service: Open Heart Surgery;  Laterality: N/A;    Current Outpatient Prescriptions  Medication Sig Dispense Refill  . amoxicillin-clavulanate (AUGMENTIN) 875-125 MG per tablet Take 1 tablet by mouth 2 (two) times daily.  20 tablet  0  . aspirin EC 81 MG tablet Take 81 mg by mouth daily.      Marland Kitchen atorvastatin (LIPITOR) 20 MG tablet Take 20 mg by mouth daily.      . cetirizine (ZYRTEC) 10 MG tablet Take 10 mg by mouth daily.        . Cholecalciferol (VITAMIN D) 2000 UNITS CAPS Take 2,000 Units by mouth daily.       . fluticasone (FLONASE) 50 MCG/ACT nasal spray Place 2 sprays into both nostrils daily.      . metoprolol succinate (TOPROL-XL) 100 MG 24 hr tablet Take 1  tablet (100 mg total) by mouth daily. Take with or immediately following a meal.  90 tablet  3  . ramipril (ALTACE) 10 MG capsule TAKE 1 CAPSULE (10 MG TOTAL) BY MOUTH DAILY.  90 capsule  1   No current facility-administered medications for this visit.    Allergies  Allergen Reactions  . Codeine     Causes bad constipation  . Dust Mite Extract Cough  . Mold Extract [Trichophyton] Cough  . Pollen Extract Cough    History   Social History  . Marital Status: Divorced    Spouse Name: N/A    Number of Children: N/A  . Years of Education: N/A   Occupational History  . Works at [employer]  . Smoking status: Former Smoker -- 2.50 packs/day for 5 years    Types: Cigarettes    Quit date: 07/06/1978  . Smokeless tobacco: Not on file     Comment: social drinker  . Alcohol Use: Yes     Comment: OCC.  . Drug Use: No  . Sexual Activity: Yes   Other Topics Concern  . Not on file   Social History Narrative   Divorced          Family History  Problem Relation Age of Onset  . Heart disease Father   . Cancer Father   . Diabetes Father   . Diabetes Mother   . Stroke Mother     ROS-  All systems are reviewed and are negative except as outlined in the HPI above  Physical Exam: Filed Vitals:   07/13/13 0942  BP: 138/89  Pulse: 91  Height: 5\' 10"  (1.778 m)  Weight: 229 lb 12.8 oz (104.237 kg)    GEN- The patient is well appearing, alert and oriented x 3 today.   Head- normocephalic, atraumatic Eyes-  Sclera clear, conjunctiva pink Ears- hearing intact Oropharynx- clear Neck- supple, no JVP Lymph- no cervical lymphadenopathy Lungs- Clear to ausculation bilaterally, normal work of breathing Heart- Regular rate and rhythm, no murmurs, rubs or gallops, PMI not laterally displaced GI- soft, NT, ND, + BS Extremities- no clubbing, cyanosis, or edema Neuro- strength and sensation are intact  Assessment and Plan:  1.  H/o post op afib/valvular heart disease Stable No change required today  2. HTN Stable No change required today  3. S/p implantable loop recorder No afib since implant Will follow through [facility]  He will see Dr Aundra Dubin in 4 months I will see as needed going forward

## 2013-07-17 ENCOUNTER — Ambulatory Visit: Payer: BC Managed Care – PPO | Admitting: Cardiology

## 2013-07-20 ENCOUNTER — Encounter: Payer: Self-pay | Admitting: Internal Medicine

## 2013-07-24 ENCOUNTER — Encounter: Payer: Self-pay | Admitting: Internal Medicine

## 2013-07-24 ENCOUNTER — Ambulatory Visit (INDEPENDENT_AMBULATORY_CARE_PROVIDER_SITE_OTHER): Payer: BC Managed Care – PPO | Admitting: Family Medicine

## 2013-07-24 VITALS — BP 155/108 | HR 74 | Temp 98.4°F | Resp 16 | Ht 71.0 in | Wt 225.0 lb

## 2013-07-24 DIAGNOSIS — H6690 Otitis media, unspecified, unspecified ear: Secondary | ICD-10-CM

## 2013-07-24 DIAGNOSIS — H9209 Otalgia, unspecified ear: Secondary | ICD-10-CM

## 2013-07-24 MED ORDER — LEVOFLOXACIN 500 MG PO TABS: 500.0000 mg | ORAL_TABLET | Freq: Every day | ORAL | Status: DC

## 2013-07-24 NOTE — Progress Notes (Signed)
   Subjective:    Patient ID: Christopher [name], male    DOB: 05-13-1951, 63 y.o.   MRN: [id number]  HPI  63 YO male patient returns to the clinic today with complaints of ear pain. He states he was in our office January 2 and completed a course of antibiotics. This past weekend he states the wind blowing into his ears caused significant pain. He states he is also having difficulty hearing.   His nose has been congested the past 2-3 days also.  Review of Systems     Objective:   Physical Exam  No acute distress Left ear is retracted with opaque fluid Oropharynx clear Neck: Supple no adenopathy Gait stable      Assessment & Plan:  Persistent otitis media

## 2013-07-24 NOTE — Patient Instructions (Signed)
Otalgia °The most common reason for this in children is an infection of the middle ear. Pain from the middle ear is usually caused by a build-up of fluid and pressure behind the eardrum. Pain from an earache can be sharp, dull, or burning. The pain may be temporary or constant. The middle ear is connected to the nasal passages by a short narrow tube called the Eustachian tube. The Eustachian tube allows fluid to drain out of the middle ear, and helps keep the pressure in your ear equalized. °CAUSES  °A cold or allergy can block the Eustachian tube with inflammation and the build-up of secretions. This is especially likely in small children, because their Eustachian tube is shorter and more horizontal. When the Eustachian tube closes, the normal flow of fluid from the middle ear is stopped. Fluid can accumulate and cause stuffiness, pain, hearing loss, and an ear infection if germs start growing in this area. °SYMPTOMS  °The symptoms of an ear infection may include fever, ear pain, fussiness, increased crying, and irritability. Many children will have temporary and minor hearing loss during and right after an ear infection. Permanent hearing loss is rare, but the risk increases the more infections a child has. Other causes of ear pain include retained water in the outer ear canal from swimming and bathing. °Ear pain in adults is less likely to be from an ear infection. Ear pain may be referred from other locations. Referred pain may be from the joint between your jaw and the skull. It may also come from a tooth problem or problems in the neck. Other causes of ear pain include: °· A foreign body in the ear. °· Outer ear infection. °· Sinus infections. °· Impacted ear wax. °· Ear injury. °· Arthritis of the jaw or TMJ problems. °· Middle ear infection. °· Tooth infections. °· Sore throat with pain to the ears. °DIAGNOSIS  °Your caregiver can usually make the diagnosis by examining you. Sometimes other special studies,  including x-rays and lab work may be necessary. °TREATMENT  °· If antibiotics were prescribed, use them as directed and finish them even if you or your child's symptoms seem to be improved. °· Sometimes PE tubes are needed in children. These are little plastic tubes which are put into the eardrum during a simple surgical procedure. They allow fluid to drain easier and allow the pressure in the middle ear to equalize. This helps relieve the ear pain caused by pressure changes. °HOME CARE INSTRUCTIONS  °· Only take over-the-counter or prescription medicines for pain, discomfort, or fever as directed by your caregiver. DO NOT GIVE CHILDREN ASPIRIN because of the association of Reye's Syndrome in children taking aspirin. °· Use a cold pack applied to the outer ear for 15-20 minutes, 03-04 times per day or as needed may reduce pain. Do not apply ice directly to the skin. You may cause frost bite. °· Over-the-counter ear drops used as directed may be effective. Your caregiver may sometimes prescribe ear drops. °· Resting in an upright position may help reduce pressure in the middle ear and relieve pain. °· Ear pain caused by rapidly descending from high altitudes can be relieved by swallowing or chewing gum. Allowing infants to suck on a bottle during airplane travel can help. °· Do not smoke in the house or near children. If you are unable to quit smoking, smoke outside. °· Control allergies. °SEEK IMMEDIATE MEDICAL CARE IF:  °· You or your child are becoming sicker. °· Pain or fever   relief is not obtained with medicine. °· You or your child's symptoms (pain, fever, or irritability) do not improve within 24 to 48 hours or as instructed. °· Severe pain suddenly stops hurting. This may indicate a ruptured eardrum. °· You or your children develop new problems such as severe headaches, stiff neck, difficulty swallowing, or swelling of the face or around the ear. °Document Released: 02/07/2004 Document Revised: 09/14/2011  Document Reviewed: 06/13/2008 °[employer]® Patient Information ©2014 [employer]. ° °

## 2013-08-07 ENCOUNTER — Ambulatory Visit (INDEPENDENT_AMBULATORY_CARE_PROVIDER_SITE_OTHER): Payer: BC Managed Care – PPO | Admitting: *Deleted

## 2013-08-07 DIAGNOSIS — I4891 Unspecified atrial fibrillation: Secondary | ICD-10-CM

## 2013-08-07 LAB — MDC_IDC_ENUM_SESS_TYPE_REMOTE

## 2013-08-09 ENCOUNTER — Encounter: Payer: Self-pay | Admitting: Internal Medicine

## 2013-08-09 LAB — MDC_IDC_ENUM_SESS_TYPE_REMOTE

## 2013-08-18 ENCOUNTER — Encounter: Payer: Self-pay | Admitting: Internal Medicine

## 2013-08-29 ENCOUNTER — Ambulatory Visit (INDEPENDENT_AMBULATORY_CARE_PROVIDER_SITE_OTHER): Payer: BC Managed Care – PPO | Admitting: *Deleted

## 2013-08-29 DIAGNOSIS — I4891 Unspecified atrial fibrillation: Secondary | ICD-10-CM

## 2013-09-04 ENCOUNTER — Other Ambulatory Visit: Payer: Self-pay | Admitting: Cardiology

## 2013-09-20 ENCOUNTER — Other Ambulatory Visit: Payer: Self-pay | Admitting: Physician Assistant

## 2013-10-01 ENCOUNTER — Other Ambulatory Visit: Payer: Self-pay | Admitting: Cardiology

## 2013-10-06 ENCOUNTER — Ambulatory Visit (INDEPENDENT_AMBULATORY_CARE_PROVIDER_SITE_OTHER): Payer: BC Managed Care – PPO | Admitting: *Deleted

## 2013-10-06 DIAGNOSIS — I4891 Unspecified atrial fibrillation: Secondary | ICD-10-CM

## 2013-10-06 LAB — MDC_IDC_ENUM_SESS_TYPE_REMOTE

## 2013-10-11 ENCOUNTER — Encounter: Payer: Self-pay | Admitting: Internal Medicine

## 2013-10-22 LAB — MDC_IDC_ENUM_SESS_TYPE_REMOTE

## 2013-11-07 ENCOUNTER — Ambulatory Visit (INDEPENDENT_AMBULATORY_CARE_PROVIDER_SITE_OTHER): Payer: BC Managed Care – PPO | Admitting: Internal Medicine

## 2013-11-07 ENCOUNTER — Encounter: Payer: Self-pay | Admitting: Cardiology

## 2013-11-07 ENCOUNTER — Ambulatory Visit (INDEPENDENT_AMBULATORY_CARE_PROVIDER_SITE_OTHER): Payer: BC Managed Care – PPO | Admitting: Cardiology

## 2013-11-07 ENCOUNTER — Encounter: Payer: Self-pay | Admitting: Internal Medicine

## 2013-11-07 ENCOUNTER — Encounter: Payer: Self-pay | Admitting: *Deleted

## 2013-11-07 VITALS — BP 136/82 | HR 72 | Ht 70.0 in | Wt 228.0 lb

## 2013-11-07 VITALS — BP 132/84 | HR 69 | Temp 97.8°F | Ht 70.0 in | Wt 227.8 lb

## 2013-11-07 DIAGNOSIS — I4891 Unspecified atrial fibrillation: Secondary | ICD-10-CM

## 2013-11-07 DIAGNOSIS — G4730 Sleep apnea, unspecified: Secondary | ICD-10-CM

## 2013-11-07 DIAGNOSIS — I2510 Atherosclerotic heart disease of native coronary artery without angina pectoris: Secondary | ICD-10-CM

## 2013-11-07 DIAGNOSIS — J309 Allergic rhinitis, unspecified: Secondary | ICD-10-CM

## 2013-11-07 DIAGNOSIS — E785 Hyperlipidemia, unspecified: Secondary | ICD-10-CM

## 2013-11-07 DIAGNOSIS — I5022 Chronic systolic (congestive) heart failure: Secondary | ICD-10-CM

## 2013-11-07 DIAGNOSIS — I509 Heart failure, unspecified: Secondary | ICD-10-CM

## 2013-11-07 DIAGNOSIS — Z1211 Encounter for screening for malignant neoplasm of colon: Secondary | ICD-10-CM

## 2013-11-07 DIAGNOSIS — Z9889 Other specified postprocedural states: Secondary | ICD-10-CM

## 2013-11-07 DIAGNOSIS — Z0000 Encounter for general adult medical examination without abnormal findings: Secondary | ICD-10-CM

## 2013-11-07 DIAGNOSIS — I10 Essential (primary) hypertension: Secondary | ICD-10-CM

## 2013-11-07 LAB — BASIC METABOLIC PANEL
BUN: 12 mg/dL (ref 6–23)
CO2: 29 mEq/L (ref 19–32)
Calcium: 9.2 mg/dL (ref 8.4–10.5)
Chloride: 103 mEq/L (ref 96–112)
Creatinine, Ser: 1.1 mg/dL (ref 0.4–1.5)
GFR: 73.9 mL/min (ref >60.00)
Glucose, Bld: 99 mg/dL (ref 70–99)
Potassium: 3.6 mEq/L (ref 3.5–5.1)
Sodium: 139 mEq/L (ref 135–145)

## 2013-11-07 MED ORDER — AZELASTINE HCL 0.1 % NA SOLN: 2.0000 | Freq: Two times a day (BID) | NASAL | Status: DC

## 2013-11-07 MED ORDER — FLUTICASONE PROPIONATE 50 MCG/ACT NA SUSP: 2.0000 | Freq: Every day | NASAL | Status: DC

## 2013-11-07 MED ORDER — LORATADINE 10 MG PO TABS: 10.0000 mg | ORAL_TABLET | Freq: Every day | ORAL | Status: DC

## 2013-11-07 NOTE — Assessment & Plan Note (Signed)
On CPAP - reports stable Follows with pulm annually Also remote uvuloplasty for same Reports ?unable to have OP colo because of same dx? Will check with GI and arrange same as overdue for colo

## 2013-11-07 NOTE — Assessment & Plan Note (Signed)
BP Readings from Last 3 Encounters:  11/07/13 136/82  11/07/13 132/84  07/24/13 155/108   The current medical regimen is effective;  continue present plan and medications.

## 2013-11-07 NOTE — Progress Notes (Signed)
Subjective:    Patient ID: Christopher [name], male    DOB: [birth year], 63 y.o.   MRN: [id number]  HPI  New patient to me, here to establish with new PCP  patient is here today for annual physical. Patient feels well and has no complaints. Also reviewed chronic medical issues and interval medical events   Past Medical History  Diagnosis Date  . Hypertension   . Rhinitis, allergic   . Severe mitral regurgitation     a. Mitral valve prolapse with flail segment of posterior leaflet and severe MR by TEE, remote h/o bacterial endocarditis   . Coronary artery disease     a. 2/7 Cath: LM nl, LAD min irregs, LCX min irregs, RI 40, RCA 28m, EF 55-60% basal to mid inf HK, 3-4+ MR;  b. 08/25/2012 PCI of RCA with 4.0x15 Vision BMS  . 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 Sleep apnea     NPSG 01/21/06- AHI 40.7/hr cpap  . GERD (gastroesophageal reflux disease)     hx  . Arthritis   . 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 Atrial fibrillation     post op, intol of anticoag  . Hyperlipidemia     on statin   Family History  Problem Relation Age of Onset  . Heart disease Father   . Colon cancer Father 45  . Diabetes Father   . Diabetes Mother   . Stroke Mother   . Renal cancer Father   . Sudden death Brother    History  Substance Use Topics  . Smoking status: Former Smoker -- 2.50 packs/day for 5 years    Types: Cigarettes    Quit date: 07/06/1978  . Smokeless tobacco: Not on file     Comment: social drinker  . Alcohol Use: Yes     Comment: OCC.    Review of Systems  Constitutional: Negative for fever, activity change, appetite change, fatigue and unexpected weight change.  HENT: Positive for postnasal drip, rhinorrhea and sneezing. Negative for ear pain, sinus pressure and sore throat.   Eyes:  Positive for discharge (watery, clear). Negative for redness, itching and visual disturbance.  Respiratory: Negative for cough, chest tightness, shortness of breath and wheezing.   Cardiovascular: Negative for chest pain, palpitations and leg swelling.  Neurological: Negative for dizziness, weakness and headaches.  Psychiatric/Behavioral: Negative for dysphoric mood. The patient is not nervous/anxious.   All other systems reviewed and are negative.      Objective:   Physical Exam  BP 132/84  Pulse 69  Temp(Src) 97.8 F (36.6 C) (Oral)  Ht 5\' 10"  (1.778 m)  Wt 227 lb 12.8 oz (103.329 kg)  BMI 32.69 kg/m2  SpO2 96% Wt Readings from Last 3 Encounters:  11/07/13 228 lb (103.42 kg)  11/07/13 227 lb 12.8 oz (103.329 kg)  07/24/13 225 lb (102.059 kg)   Constitutional: he appears well-developed and well-nourished. No distress.  HENT: Head: Normocephalic and atraumatic. Ears: B TMs ok, no erythema or effusion; Nose: Nose normal. Mouth/Throat: Oropharynx is clear and moist. No oropharyngeal exudate.  Eyes: Conjunctivae and EOM are normal. Pupils are equal, round, and reactive to light. No scleral icterus.  Neck: Normal range of motion. Neck supple. No JVD present. No thyromegaly present.  Cardiovascular: Normal rate, regular rhythm and normal heart sounds.  No  murmur heard. No BLE edema. Pulmonary/Chest: Effort normal and breath sounds normal. No respiratory distress. he has no wheezes.  Abdominal: Soft. Bowel sounds are normal. he exhibits no distension. There is no tenderness. no masses Musculoskeletal: Normal range of motion, no joint effusions. No gross deformities Neurological: he is alert and oriented to person, place, and time. No cranial nerve deficit. Coordination, balance, strength, speech and gait are normal.  Skin: Skin is warm and dry. No rash noted. No erythema.  Psychiatric: he has a normal mood and affect. behavior is normal. Judgment and thought content normal.  Lab  Results  Component Value Date   WBC 7.0 03/09/2013   HGB 12.1* 03/09/2013   HCT 36.9* 03/09/2013   PLT 217 03/09/2013   GLUCOSE 96 05/03/2013   CHOL 128 05/03/2013   TRIG 120 05/03/2013   HDL 47 05/03/2013   LDLDIRECT 138.0 01/08/2012   LDLCALC 57 05/03/2013   ALT 18 05/03/2013   AST 27 11/04/2012   NA 139 05/03/2013   K 3.8 05/03/2013   CL 104 05/03/2013   CREATININE 1.07 05/03/2013   BUN 15 05/03/2013   CO2 27 05/03/2013   TSH 2.75 11/01/2012   PSA 0.99 05/03/2013   INR 1.02 03/09/2013   HGBA1C 5.2 05/03/2013    No results found.     Assessment & Plan:   CPX/v70.0 - Patient has been counseled on age-appropriate routine health concerns for screening and prevention. These are reviewed and up-to-date. Immunizations are up-to-date or declined. Labs reviewed. Refer for colo - 44yr f/u due to [facility], but reports usual location refused to perform as OP procedure because of OSA on CPAP. Pt wishes to avoid cost of IP procedure so will check with other facilities on this restriction and arrange elsewhere if can be done as OP  Problem List Items Addressed This Visit   ALLERGIC RHINITIS     Chronic symptoms with seasonal flare Advise change in OTC antihistamine, continue nasal steroid and add nasal antihistamine    Hyperlipidemia     On statin Monitor as per cards Titrate as needed The current medical regimen is effective;  continue present plan and medications.     HYPERTENSION      BP Readings from Last 3 Encounters:  11/07/13 136/82  11/07/13 132/84  07/24/13 155/108   The current medical regimen is effective;  continue present plan and medications.     S/P mitral valve repair     Repair via mini thoracotomy 09/2012 reviewed Remote SBE 2009 also reviewed Doing well, follow up cards annually and prn    SLEEP APNEA     On CPAP - reports stable Follows with pulm annually Also remote uvuloplasty for same Reports ?unable to have OP colo because of same dx? Will check with GI and  arrange same as overdue for colo      Other Visit Diagnoses   Routine general medical examination at a health care facility    -  Primary    Special screening for malignant neoplasms, colon        Relevant Orders       Ambulatory referral to Gastroenterology

## 2013-11-07 NOTE — Progress Notes (Signed)
Pre visit review using our clinic review tool, if applicable. No additional management support is needed unless otherwise documented below in the visit note. 

## 2013-11-07 NOTE — Assessment & Plan Note (Signed)
On statin Monitor as per cards Titrate as needed The current medical regimen is effective;  continue present plan and medications.

## 2013-11-07 NOTE — Patient Instructions (Addendum)
It was good to see you today.  We have reviewed your prior records including labs and tests today  Health Maintenance reviewed - all recommended immunizations and age-appropriate screenings are up-to-date.  Will refer for colonoscopy to be done as outpatient per request, will try for [place] [facility]   Medications reviewed and updated Change Zyrtec to Claritin and begin Astelin nose spray for allergy symptoms in addition to Flonase as ongoing Your prescription(s) have been submitted to your pharmacy. Please take as directed and contact our office if you believe you are having problem(s) with the medication(s).  Please schedule followup in 12 months for annual exam and labs, call sooner if problems.  Health Maintenance, Males A healthy lifestyle and preventative care can promote health and wellness.  Maintain regular health, dental, and eye exams.  Eat a healthy diet. Foods like vegetables, fruits, whole grains, low-fat dairy products, and lean protein foods contain the nutrients you need and are low in calories. Decrease your intake of foods high in solid fats, added sugars, and salt. Get information about a proper diet from your health care provider, if necessary.  Regular physical exercise is one of the most important things you can do for your health. Most adults should get at least 150 minutes of moderate-intensity exercise (any activity that increases your heart rate and causes you to sweat) each week. In addition, most adults need muscle-strengthening exercises on 2 or more days a week.   Maintain a healthy weight. The body mass index (BMI) is a screening tool to identify possible weight problems. It provides an estimate of body fat based on height and weight. Your health care provider can find your BMI and can help you achieve or maintain a healthy weight. For males 20 years and older:  A BMI below 18.5 is considered underweight.  A BMI of 18.5 to 24.9 is normal.  A BMI of 25 to  29.9 is considered overweight.  A BMI of 30 and above is considered obese.  Maintain normal blood lipids and cholesterol by exercising and minimizing your intake of saturated fat. Eat a balanced diet with plenty of fruits and vegetables. Blood tests for lipids and cholesterol should begin at age 38 and be repeated every 5 years. If your lipid or cholesterol levels are high, you are over 50, or you are at high risk for heart disease, you may need your cholesterol levels checked more frequently.Ongoing high lipid and cholesterol levels should be treated with medicines, if diet and exercise are not working.  If you smoke, find out from your health care provider how to quit. If you do not use tobacco, do not start.  Lung cancer screening is recommended for adults aged 59 80 years who are at high risk for developing lung cancer because of a history of smoking. A yearly low-dose CT scan of the lungs is recommended for people who have at least a 30-pack-year history of smoking and are a current smoker or have quit within the past 15 years. A pack year of smoking is smoking an average of 1 pack of cigarettes a day for 1 year (for example, a 30-pack-year history of smoking could mean smoking 1 pack a day for 30 years or 2 packs a day for 15 years). Yearly screening should continue until the smoker has stopped smoking for at least 15 years. Yearly screening should be stopped for people who develop a health problem that would prevent them from having lung cancer treatment.  If you choose  to drink alcohol, do not have more than 2 drinks per day. One drink is considered to be 12 oz (360 mL) of beer, 5 oz (150 mL) of wine, or 1.5 oz (45 mL) of liquor.  Avoid use of street drugs. Do not share needles with anyone. Ask for help if you need support or instructions about stopping the use of drugs.  High blood pressure causes heart disease and increases the risk of stroke. Blood pressure should be checked at least every  1 2 years. Ongoing high blood pressure should be treated with medicines if weight loss and exercise are not effective.  If you are 65 64 years old, ask your health care provider if you should take aspirin to prevent heart disease.  Diabetes screening involves taking a blood sample to check your fasting blood sugar level. This should be done once every 3 years after age 17, if you are at a normal weight and without risk factors for diabetes. Testing should be considered at a younger age or be carried out more frequently if you are overweight and have at least 1 risk factor for diabetes.  Colorectal cancer can be detected and often prevented. Most routine colorectal cancer screening begins at the age of 37 and continues through age 48. However, your health care provider may recommend screening at an earlier age if you have risk factors for colon cancer. On a yearly basis, your health care provider may provide home test kits to check for hidden blood in the stool. A small camera at the end of a tube may be used to directly examine the colon (sigmoidoscopy or colonoscopy) to detect the earliest forms of colorectal cancer. Talk to your health care provider about this at age 50, when routine screening begins. A direct exam of the colon should be repeated every 5 10 years through age 97, unless early forms of pre-cancerous polyps or small growths are found.  People who are at an increased risk for hepatitis B should be screened for this virus. You are considered at high risk for hepatitis B if:  You were born in a country where hepatitis B occurs often. Talk with your health care provider about which countries are considered high-risk.  Your parents were born in a high-risk country and you have not received a shot to protect against hepatitis B (hepatitis B vaccine).  You have HIV or AIDS.  You use needles to inject street drugs.  You live with, or have sex with, someone who has hepatitis B.  You are a  man who has sex with other men (MSM).  You get hemodialysis treatment.  You take certain medicines for conditions like cancer, organ transplantation, and autoimmune conditions.  Hepatitis C blood testing is recommended for all people born from [birth year] through 1966 and any individual with known risk factors for hepatitis C.  Healthy men should no longer receive prostate-specific antigen (PSA) blood tests as part of routine cancer screening. Talk to your health care provider about prostate cancer screening.  Testicular cancer screening is not recommended for adolescents or adult males who have no symptoms. Screening includes self-exam, a health care provider exam, and other screening tests. Consult with your health care provider about any symptoms you have or any concerns you have about testicular cancer.  Practice safe sex. Use condoms and avoid high-risk sexual practices to reduce the spread of sexually transmitted infections (STIs).  Use sunscreen. Apply sunscreen liberally and repeatedly throughout the day. You should seek shade when  your shadow is shorter than you. Protect yourself by wearing long sleeves, pants, a wide-brimmed hat, and sunglasses year round, whenever you are outdoors.  Tell your health care provider of new moles or changes in moles, especially if there is a change in shape or color. Also tell your provider if a mole is larger than the size of a pencil eraser.  A one-time screening for abdominal aortic aneurysm (AAA) and surgical repair of large AAAs by ultrasound is recommended for men aged 75 75 years who are current or former smokers.  Stay current with your vaccines (immunizations). Document Released: 12/19/2007 Document Revised: 04/12/2013 Document Reviewed: 11/17/2010 [facility] Patient Information 2014 [place], [place].

## 2013-11-07 NOTE — Assessment & Plan Note (Signed)
Repair via mini thoracotomy 09/2012 reviewed Remote SBE 2009 also reviewed Doing well, follow up cards annually and prn

## 2013-11-07 NOTE — Assessment & Plan Note (Signed)
Chronic symptoms with seasonal flare Advise change in OTC antihistamine, continue nasal steroid and add nasal antihistamine

## 2013-11-07 NOTE — Patient Instructions (Signed)
Your physician recommends that you have  lab work today--BMET.  Your physician has requested that you have an echocardiogram. Echocardiography is a painless test that uses sound waves to create images of your heart. It provides your doctor with information about the size and shape of your heart and how well your heart's chambers and valves are working. This procedure takes approximately one hour. There are no restrictions for this procedure.  Your physician wants you to follow-up in: 6 months with Dr Aundra Dubin. (November 2015).  You will receive a reminder letter in the mail two months in advance. If you don't receive a letter, please call our office to schedule the follow-up appointment.

## 2013-11-08 ENCOUNTER — Telehealth: Payer: Self-pay | Admitting: Internal Medicine

## 2013-11-08 ENCOUNTER — Ambulatory Visit (INDEPENDENT_AMBULATORY_CARE_PROVIDER_SITE_OTHER): Payer: BC Managed Care – PPO | Admitting: *Deleted

## 2013-11-08 DIAGNOSIS — I4891 Unspecified atrial fibrillation: Secondary | ICD-10-CM

## 2013-11-08 NOTE — Telephone Encounter (Signed)
Relevant patient education assigned to patient using Emmi. ° °

## 2013-11-08 NOTE — Progress Notes (Signed)
Patient ID: Christopher [name], male   DOB: 1950/10/20, 63 y.o.   MRN: [id number] PCP: Dr. 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 He had a TEE in 7/13 that showed EF 55-60% with partial flail posterior leaflet and at least moderate but probably severe eccentric anteriorly directed MR.  The LV was upper normal in size.  Patient had noted the onset of exertional dyspnea.  No chest pain.  I took him for TEE, which showed flail P2 and P1 segments of the mitral valve with a ruptured chord.  There was severe MR.  LHC showed 95% mRCA stenosis.  As he wanted a minimally invasive mitral valve repair, arranged for PCI to the mid RCA, which was done using BMS.  After 1 month of Plavix, he had a minimally-invasive mitral valve repair with uncomplicated hospital course.  He was started on coumadin for a few months post-operatively.  Initially, he did well and was walking for 30 minutes a day.  However,  prior to the last appointment, he noted dyspnea with walking about 50 feet or walking up steps.  Lasix was started.  CXR showed a new right pleural effusion and he has now had 2 thoracenteses. He was noted to be in atrial fibrillation as well at a prior appointment. He went back into NSR on amiodarone.  Post-op echo showed that the mitral valve looked good but EF was 35-40% with global hypokinesis.  Repeat echo in 7/14 showed EF 45-50% with stable mitral valve repair.    Patient had to stop Eliquis due to headaches.  He then started Xarelto.  He developed a spontaneous right thigh hematoma while on Xarelto and this was stopped also.  He is now on ASA 81 mg daily only. He then had a loop recorder placed to see if he was having any further episodes of atrial fibrillation.  No recurrence so far. He is working full time. He feels good.  He is not taking Lasix now.  No chest pain or significant exertional dyspnea.  No  tachypalpitations.   ECG: NSR, RBBB, LAFB, inferior Qs   Labs (7/12): LDL 128 Labs (7/13): K 3.9, creatinine 0.9, HDL 54, LDL 133 Labs (4/14): K 4.2, creatinine 1.2, TSH normal, LFTs normal, BNP 132 Labs (5/14): LDL 57, HDL 33 Labs (9/14): HCT 36.9, K 3.5, creatinine 0.87 Labs (10/14): K 3.8, creatinine 1.07, LDL 57, HDL 47  PMH: 1. HTN 2. H/o sinusitis 3. Nephrolithiasis 4. Mitral valve disease: Baseline mitral valve prolapse.  Had MV endocarditis (Strep viridans) in 4/09.  Has had moderate to severe mitral regurgitation.  Echo (7/12): EF 60%, upper normal LV size (EDD 55 mm, ESD 35 mm), moderate (grade II) diastolic dysfunction with E/medial e' < 15, moderate to possibly severe eccentric MR with mitral valve prolapse and partial flail posterior leaflet, mild LAE. Echo (7/13): EF 55-60%, upper normal LV size, grade II diastolic dysfunction with E/e' < 15, IVC normal, flail segment of posterior leaflet with at least moderate and probably severe anteriorly directed MR, normal RV, unable to measure PA systolic pressure.  TEE (2/14) with EF 55-60%, flail P2 and P1 segments of the posterior leaflet with a ruptured chord, eccentric severe MR.  Status post minimally-invasive mitral valve repair in 3/14.  5. CAD: LHC (2/14) showed 95% mRCA stenosis.  This was treated with BMS.  6. Atrial fibrillation: First noted in 4/14. Headaches with Eliquis.  Thigh hematoma with Xarelto.  He now has a loop recorder to assess for recurrent atrial fibrillation, none so far.  7. Cardiomyopathy: Echo (4/14) with EF 35-40%, diffuse hypokinesis, s/p mitral valve repair with no mitral regurgitation.  Echo (7/14) with EF 45-50%, septal hypokinesis, MV repair w/o MR or significant stenosis.  8. Right-sided pleural effusion post-MV surgery  SH: Works at [employer] on [employer].  Divorced.  Occasional ETOH.  No smoking.   FH: Father with CAD, brother with MI at 62.    ROS: All systems reviewed and negative except as per  HPI.   Current Outpatient Prescriptions  Medication Sig Dispense Refill  . aspirin EC 81 MG tablet Take 81 mg by mouth daily.      Marland Kitchen atorvastatin (LIPITOR) 20 MG tablet TAKE 1 TABLET BY MOUTH EVERY DAY  90 tablet  0  . azelastine (ASTELIN) 0.1 % nasal spray Place 2 sprays into both nostrils 2 (two) times daily. Use in each nostril as directed  30 mL  2  . Cholecalciferol (VITAMIN D) 2000 UNITS CAPS Take 2,000 Units by mouth daily.       . fluticasone (FLONASE) 50 MCG/ACT nasal spray Place 2 sprays into both nostrils daily.  48 g  2  . loratadine (CLARITIN) 10 MG tablet Take 1 tablet (10 mg total) by mouth daily.  30 tablet  11  . metoprolol succinate (TOPROL-XL) 100 MG 24 hr tablet Take 1 tablet (100 mg total) by mouth daily. Take with or immediately following a meal.  90 tablet  3  . ramipril (ALTACE) 10 MG capsule TAKE 1 CAPSULE (10 MG TOTAL) BY MOUTH DAILY.  90 capsule  0   No current facility-administered medications for this visit.    BP 136/82  Pulse 72  Ht 5\' 10"  (1.778 m)  Wt 103.42 kg (228 lb)  BMI 32.71 kg/m2 General: NAD Neck: No JVD, no thyromegaly or thyroid nodule.  Lungs: Slight decreased breath sounds right base. CV: Nondisplaced PMI.  Heart regular S1/S2, no S3/S4, no significant murmur.  No peripheral edema.  No carotid bruit.  Normal pedal pulses.  Abdomen: Soft, nontender, no hepatosplenomegaly, no distention.  Neurologic: Alert and oriented x 3.  Psych: Normal affect. Extremities: No clubbing or cyanosis.   Assessment/Plan:  Mitral valve regurgitation  Severe eccentric MR in the setting of MV prolapse and history of MV endocarditis, now s/p minimally invasive MV repair.  The valve looked good on 7/14 echo.   Atrial fibrillation He did not tolerate Eliquis due to headaches and developed a thigh hematoma spontaneously on Xarelto.  He is now off anticoagulation.  He had post-op atrial fibrillation after MVR, but the atrial fibrillation occurred relatively far out  from the procedure.  He continues to be in NSR.  No symptoms concerning for atrial fibrillation at this time.  He has a loop recorder that has shown no atrial fibrillation so far. If he has a recurrence will need to revisit starting an anticoagulant.  If not, given his recent spontaneous thigh hematoma, would hold off.  CAD Status post PCI to [facility] with BMS.  Stable, no significant ischemic chest pain.  Continue ASA, statin.  Hyperlipidemia Statin begun with CAD.  Good lipids in 10/14.  Cardiomyopathy EF 45-50% on last echo.  NYHA class I-II.  - Continue ramipril and Toprol XL at current doses.  - BMET today.  - Will get echo to reassess LV systolic function, look for improvement.   Followup in 6 months.  Dalton Claris Gladden 11/08/2013   [name] 11/08/2013

## 2013-11-09 ENCOUNTER — Ambulatory Visit (HOSPITAL_COMMUNITY)
Admission: RE | Admit: 2013-11-09 | Discharge: 2013-11-09 | Disposition: A | Discharge status: Home / Self Care | Payer: BC Managed Care – PPO | Source: Ambulatory Visit | Attending: Cardiology | Admitting: Cardiology

## 2013-11-09 DIAGNOSIS — I5022 Chronic systolic (congestive) heart failure: Secondary | ICD-10-CM

## 2013-11-09 DIAGNOSIS — I517 Cardiomegaly: Secondary | ICD-10-CM

## 2013-11-09 DIAGNOSIS — I509 Heart failure, unspecified: Secondary | ICD-10-CM | POA: Insufficient documentation

## 2013-11-09 NOTE — Progress Notes (Signed)
2D Echocardiogram Complete.  11/09/2013   Daijon Wenke, RDCS 

## 2013-11-27 ENCOUNTER — Telehealth: Payer: Self-pay | Admitting: Internal Medicine

## 2013-11-27 ENCOUNTER — Ambulatory Visit (INDEPENDENT_AMBULATORY_CARE_PROVIDER_SITE_OTHER): Payer: BC Managed Care – PPO | Admitting: Internal Medicine

## 2013-11-27 VITALS — BP 154/92 | HR 68 | Temp 98.5°F | Resp 17 | Ht 71.0 in | Wt 230.0 lb

## 2013-11-27 DIAGNOSIS — J309 Allergic rhinitis, unspecified: Secondary | ICD-10-CM

## 2013-11-27 DIAGNOSIS — H9209 Otalgia, unspecified ear: Secondary | ICD-10-CM

## 2013-11-27 MED ORDER — PREDNISONE 20 MG PO TABS
ORAL_TABLET | ORAL | Status: DC
Start: 2013-11-27 — End: 2013-12-08

## 2013-11-27 NOTE — Telephone Encounter (Signed)
Call to [facility].  Dr. Laney Pastor has not yet called in prednisone.  She will get him to do this as soon as he can.  Spoke to patient and advised him that the doctor will call in his prednisone as soon as possible.  He was very grateful.

## 2013-11-27 NOTE — Progress Notes (Signed)
   Subjective:    Patient ID: Christopher [name], male    DOB: 15-Jan-1951, 63 y.o.   MRN: [id number]  HPI complaining of a decrease in hearing in the left ear with fullness and mild discomfort over  the last 10-14 days This is the same complaint he had when he saw Dr. Ouida Sills  And Dr Joseph Art recently and was treated with antibiotics and referred to ear nose and throat surgery. Dr. Claria Dice evaluated him and felt everything was normal and he was asymptomatic at the time. He's had significant allergies over the last month but no other illnesses and no fever. He has had early morning discharge with matting over the last week. His nasal mucus is clear. He has no cough.  He has a significant cardiac history but is now stable post surgery  Review of Systems Do not apply this illness    Objective:   Physical Exam BP 154/92  Pulse 68  Temp(Src) 98.5 F (36.9 C) (Oral)  Resp 17  Ht 5\' 11"  (1.803 m)  Wt 230 lb (104.327 kg)  BMI 32.09 kg/m2  SpO2 97% PE RRL A. with EOMs conjugate Conjunctivae are mildly injected and there is excessive right mucus bilaterally on lids Nares are boggy with swollen turbinates but no discharge Throat is slightly injected Both TMs are slightly red streaked but without obvious signs of fluid or infection Gross hearing is intact Hearing screen attempted with the machine but he cannot distinguish sounds in a pattern that is conclusive or interpretive      Assessment & Plan:  Allergic rhinitis with serous otitis  Meds ordered this encounter  Medications  . predniSONE (DELTASONE) 20 MG tablet    Sig: 4/3/3/2/2/1/1 single daily dose for 7 days    Dispense:  16 tablet    Refill:  0   Will repeat hearing test or have ENT do this after treatment Followup in one week

## 2013-11-27 NOTE — Telephone Encounter (Signed)
Patient thought he was supposed to have a Prednisone called to the  [facility] on [address]. He was seen today  [phone]

## 2013-11-29 NOTE — Telephone Encounter (Signed)
Script was called in on 5/25

## 2013-12-06 ENCOUNTER — Ambulatory Visit (INDEPENDENT_AMBULATORY_CARE_PROVIDER_SITE_OTHER): Payer: BC Managed Care – PPO | Admitting: Physician Assistant

## 2013-12-06 VITALS — BP 127/70 | HR 72 | Temp 98.1°F | Resp 18 | Ht 69.0 in | Wt 223.0 lb

## 2013-12-06 DIAGNOSIS — H6990 Unspecified Eustachian tube disorder, unspecified ear: Secondary | ICD-10-CM

## 2013-12-06 DIAGNOSIS — H6690 Otitis media, unspecified, unspecified ear: Secondary | ICD-10-CM

## 2013-12-06 DIAGNOSIS — H6980 Other specified disorders of Eustachian tube, unspecified ear: Secondary | ICD-10-CM

## 2013-12-06 MED ORDER — AMOXICILLIN-POT CLAVULANATE 875-125 MG PO TABS: 1.0000 | ORAL_TABLET | Freq: Two times a day (BID) | ORAL | Status: DC

## 2013-12-07 NOTE — Progress Notes (Signed)
   Subjective:    Patient ID: Christopher [name], male    DOB: Jul 30, 1950, 63 y.o.   MRN: [id number]  HPI 63 year old male presents for evaluation of left ear fullness/pressure. States symptoms have been present for >2 weeks. Was seen here on 5/25 and placed on prednisone for allergic rhinitis/conjunctivitis. He also uses Flonase and Astelin NS as well as takes Claritin daily.  Hx of severe allergies - was on Zyrtec for "years" and does think it worked better than the Claritin is.  Admits all URI sx's have resolved except the pressure in his ear has not improved.  He continues to have muffled hearing and feeling of popping/pressure.   Denies fever, chills, nasal congestion, PND, cough, headache, dizziness, or tinnitus.     Review of Systems  Constitutional: Negative for fever and chills.  HENT: Positive for ear pain (left side). Negative for congestion, postnasal drip, sore throat and tinnitus.   Respiratory: Negative for cough.   Neurological: Negative for dizziness and headaches.       Objective:   Physical Exam  Constitutional: He is oriented to person, place, and time. He appears well-developed and well-nourished.  HENT:  Head: Normocephalic and atraumatic.  Right Ear: Hearing, tympanic membrane, external ear and ear canal normal.  Left Ear: Hearing, external ear and ear canal normal. Tympanic membrane is erythematous. A middle ear effusion is present.  Mouth/Throat: Uvula is midline, oropharynx is clear and moist and mucous membranes are normal.  Eyes: Conjunctivae are normal.  Cardiovascular: Normal rate.   Pulmonary/Chest: Effort normal.  Neurological: He is alert and oriented to person, place, and time.  Psychiatric: He has a normal mood and affect. His behavior is normal. Judgment and thought content normal.          Assessment & Plan:  Otitis media - Plan: amoxicillin-clavulanate (AUGMENTIN) 875-125 MG per tablet  ETD (eustachian tube dysfunction)  Will treat with  Augmentin 875 mg bid x 10 days Continue Flonase and Astelin as directed. Recommend he either switch back to Zyrtec or trial of Allegra If symptoms fail to improve in 48-72 hours, recommend ENT evaluation.

## 2013-12-08 ENCOUNTER — Ambulatory Visit (INDEPENDENT_AMBULATORY_CARE_PROVIDER_SITE_OTHER): Payer: BC Managed Care – PPO | Admitting: *Deleted

## 2013-12-08 ENCOUNTER — Ambulatory Visit (INDEPENDENT_AMBULATORY_CARE_PROVIDER_SITE_OTHER): Payer: BC Managed Care – PPO | Admitting: Emergency Medicine

## 2013-12-08 VITALS — BP 142/100 | HR 69 | Temp 98.2°F | Resp 16 | Ht 71.0 in | Wt 232.0 lb

## 2013-12-08 DIAGNOSIS — I4891 Unspecified atrial fibrillation: Secondary | ICD-10-CM

## 2013-12-08 DIAGNOSIS — H6690 Otitis media, unspecified, unspecified ear: Secondary | ICD-10-CM

## 2013-12-08 DIAGNOSIS — H6692 Otitis media, unspecified, left ear: Secondary | ICD-10-CM

## 2013-12-08 LAB — MDC_IDC_ENUM_SESS_TYPE_REMOTE

## 2013-12-08 MED ORDER — PREDNISONE 10 MG PO TABS: ORAL_TABLET | ORAL | Status: DC

## 2013-12-08 NOTE — Patient Instructions (Signed)
Serous Otitis Media  Serous otitis media is fluid in the middle ear space. This space contains the bones for hearing and air. Air in the middle ear space helps to transmit sound.  The air gets there through the eustachian tube. This tube goes from the back of the nose (nasopharynx) to the middle ear space. It keeps the pressure in the middle ear the same as the outside world. It also helps to drain fluid from the middle ear space. CAUSES  Serous otitis media occurs when the eustachian tube gets blocked. Blockage can come from:  Ear infections.  Colds and other upper respiratory infections.  Allergies.  Irritants such as cigarette smoke.  Sudden changes in air pressure (such as descending in an airplane).  Enlarged adenoids.  A mass in the nasopharynx. During colds and upper respiratory infections, the middle ear space can become temporarily filled with fluid. This can happen after an ear infection also. Once the infection clears, the fluid will generally drain out of the ear through the eustachian tube. If it does not, then serous otitis media occurs. SIGNS AND SYMPTOMS   Hearing loss.  A feeling of fullness in the ear, without pain.  Young children may not show any symptoms but may show slight behavioral changes, such as agitation, ear pulling, or crying. DIAGNOSIS  Serous otitis media is diagnosed by an ear exam. Tests may be done to check on the movement of the eardrum. Hearing exams may also be done. TREATMENT  The fluid most often goes away without treatment. If allergy is the cause, allergy treatment may be helpful. Fluid that persists for several months may require minor surgery. A small tube is placed in the eardrum to:  Drain the fluid.  Restore the air in the middle ear space. In certain situations, antibiotics are used to avoid surgery. Surgery may be done to remove enlarged adenoids (if this is the cause). HOME CARE INSTRUCTIONS   Keep children away from tobacco  smoke.  Be sure to keep any follow-up appointments. SEEK MEDICAL CARE IF:   Your hearing is not better in 3 months.  Your hearing is worse.  You have ear pain.  You have drainage from the ear.  You have dizziness.  You have serous otitis media only in one ear or have any bleeding from your nose (epistaxis).  You notice a lump on your neck. MAKE SURE YOU:  Understand these instructions.   Will watch your condition.   Will get help right away if you are not doing well or get worse.  Document Released: 09/12/2003 Document Revised: 02/22/2013 Document Reviewed: 01/17/2013 [employer] Patient Information 2014 [employer].  

## 2013-12-08 NOTE — Progress Notes (Signed)
   Subjective:    Patient ID: Christopher [name], male    DOB: 24-Nov-1950, 63 y.o.   MRN: [id number]  HPI patient enters for followup of a persistent serous otitis media of his left ear. Initially he was treated with prednisone. This did not resolve and he was started on Augmentin. He had a similar episode the first of the year and saw Dr. [name] but by the time he saw him his symptoms had resolved.    Review of Systems     Objective:   Physical Exam Right ear is normal left TM has a yellow serous fluid present behind the tympanic membrane. Throat is normal Weber testing localizes to the left. He has 50% hearing in the left ear to tuning fork testing.      Assessment & Plan:   Add  prednisone to his Augmentin. 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

## 2013-12-09 ENCOUNTER — Other Ambulatory Visit: Payer: Self-pay | Admitting: Cardiology

## 2013-12-15 ENCOUNTER — Other Ambulatory Visit: Payer: Self-pay | Admitting: Cardiology

## 2013-12-20 ENCOUNTER — Encounter: Payer: Self-pay | Admitting: Internal Medicine

## 2013-12-25 LAB — MDC_IDC_ENUM_SESS_TYPE_REMOTE

## 2014-01-06 ENCOUNTER — Other Ambulatory Visit: Payer: Self-pay | Admitting: Cardiology

## 2014-01-09 ENCOUNTER — Ambulatory Visit (INDEPENDENT_AMBULATORY_CARE_PROVIDER_SITE_OTHER): Payer: BC Managed Care – PPO | Admitting: *Deleted

## 2014-01-09 DIAGNOSIS — I4891 Unspecified atrial fibrillation: Secondary | ICD-10-CM

## 2014-01-09 LAB — MDC_IDC_ENUM_SESS_TYPE_REMOTE

## 2014-01-17 NOTE — Progress Notes (Signed)
Loop recorder 

## 2014-01-19 ENCOUNTER — Encounter: Payer: Self-pay | Admitting: Internal Medicine

## 2014-02-07 ENCOUNTER — Ambulatory Visit (INDEPENDENT_AMBULATORY_CARE_PROVIDER_SITE_OTHER): Payer: BC Managed Care – PPO | Admitting: *Deleted

## 2014-02-07 ENCOUNTER — Encounter: Payer: Self-pay | Admitting: Internal Medicine

## 2014-02-07 DIAGNOSIS — I4891 Unspecified atrial fibrillation: Secondary | ICD-10-CM

## 2014-02-07 LAB — MDC_IDC_ENUM_SESS_TYPE_REMOTE

## 2014-02-12 NOTE — Progress Notes (Signed)
Loop recorder 

## 2014-03-07 ENCOUNTER — Ambulatory Visit (INDEPENDENT_AMBULATORY_CARE_PROVIDER_SITE_OTHER): Payer: BC Managed Care – PPO | Admitting: *Deleted

## 2014-03-07 DIAGNOSIS — I4891 Unspecified atrial fibrillation: Secondary | ICD-10-CM

## 2014-03-07 LAB — MDC_IDC_ENUM_SESS_TYPE_REMOTE

## 2014-03-13 ENCOUNTER — Other Ambulatory Visit: Payer: Self-pay | Admitting: Cardiology

## 2014-03-13 ENCOUNTER — Other Ambulatory Visit: Payer: Self-pay | Admitting: Internal Medicine

## 2014-03-14 ENCOUNTER — Other Ambulatory Visit: Payer: Self-pay

## 2014-03-14 NOTE — Telephone Encounter (Signed)
Error

## 2014-03-16 NOTE — Progress Notes (Signed)
Loop recorder 

## 2014-03-21 ENCOUNTER — Encounter: Payer: Self-pay | Admitting: Internal Medicine

## 2014-03-28 ENCOUNTER — Encounter: Payer: Self-pay | Admitting: Internal Medicine

## 2014-04-09 ENCOUNTER — Ambulatory Visit (INDEPENDENT_AMBULATORY_CARE_PROVIDER_SITE_OTHER): Payer: BC Managed Care – PPO | Admitting: *Deleted

## 2014-04-09 ENCOUNTER — Ambulatory Visit (INDEPENDENT_AMBULATORY_CARE_PROVIDER_SITE_OTHER): Payer: BC Managed Care – PPO

## 2014-04-09 DIAGNOSIS — Z23 Encounter for immunization: Secondary | ICD-10-CM

## 2014-04-09 DIAGNOSIS — I4891 Unspecified atrial fibrillation: Secondary | ICD-10-CM

## 2014-04-13 NOTE — Progress Notes (Signed)
Loop recorder 

## 2014-04-20 LAB — MDC_IDC_ENUM_SESS_TYPE_REMOTE

## 2014-04-24 ENCOUNTER — Encounter: Payer: Self-pay | Admitting: Internal Medicine

## 2014-05-08 ENCOUNTER — Encounter: Payer: Self-pay | Admitting: *Deleted

## 2014-05-09 ENCOUNTER — Encounter: Payer: Self-pay | Admitting: Cardiology

## 2014-05-09 ENCOUNTER — Ambulatory Visit (INDEPENDENT_AMBULATORY_CARE_PROVIDER_SITE_OTHER): Payer: BC Managed Care – PPO | Admitting: Cardiology

## 2014-05-09 VITALS — BP 140/76 | HR 69 | Ht 71.0 in | Wt 231.0 lb

## 2014-05-09 DIAGNOSIS — E785 Hyperlipidemia, unspecified: Secondary | ICD-10-CM

## 2014-05-09 DIAGNOSIS — I2510 Atherosclerotic heart disease of native coronary artery without angina pectoris: Secondary | ICD-10-CM

## 2014-05-09 DIAGNOSIS — I480 Paroxysmal atrial fibrillation: Secondary | ICD-10-CM

## 2014-05-09 DIAGNOSIS — I340 Nonrheumatic mitral (valve) insufficiency: Secondary | ICD-10-CM

## 2014-05-09 NOTE — Patient Instructions (Signed)
Your physician recommends that you have  a FASTING lipid profile today.  Your physician wants you to follow-up in: 1 year with Dr Aundra Dubin. (November 2016).  You will receive a reminder letter in the mail two months in advance. If you don't receive a letter, please call our office to schedule the follow-up appointment.

## 2014-05-10 LAB — LDL CHOLESTEROL, DIRECT: Direct LDL: 72.7 mg/dL

## 2014-05-10 LAB — LIPID PANEL
Cholesterol: 153 mg/dL (ref 0–200)
HDL: 33.9 mg/dL — ABNORMAL LOW (ref >39.00)
NonHDL: 119.1
Total CHOL/HDL Ratio: 5
Triglycerides: 228 mg/dL — ABNORMAL HIGH (ref 0.0–149.0)
VLDL: 45.6 mg/dL — ABNORMAL HIGH (ref 0.0–40.0)

## 2014-05-10 NOTE — Progress Notes (Signed)
Patient ID: Christopher [name], male   DOB: 1951/03/15, 63 y.o.   MRN: [id number] PCP: Dr. 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 He had a TEE in 7/13 that showed EF 55-60% with partial flail posterior leaflet and at least moderate but probably severe eccentric anteriorly directed MR.  The LV was upper normal in size.  Patient had noted the onset of exertional dyspnea.  No chest pain.  I took him for TEE, which showed flail P2 and P1 segments of the mitral valve with a ruptured chord.  There was severe MR.  LHC showed 95% mRCA stenosis.  As he wanted a minimally invasive mitral valve repair, arranged for PCI to the mid RCA, which was done using BMS.  After 1 month of Plavix, he had a minimally-invasive mitral valve repair with uncomplicated hospital course.  He went into atrial fibrillation several weeks after his surgery but converted back to NSR on amiodarone.  Patient had to stop Eliquis due to headaches.  He then started Xarelto.  He developed a spontaneous right thigh hematoma while on Xarelto and this was stopped also.  He is now on ASA 81 mg daily only. He then had a loop recorder placed to see if he was having any further episodes of atrial fibrillation.  No recurrence so far. He is working full time. He feels good.  He is not taking Lasix now.  No chest pain or significant exertional dyspnea.  No tachypalpitations.   Post-op echo showed that the mitral valve looked good but EF was 35-40% with global hypokinesis.  Repeat echo in 5/15 showed EF improved to 55-60% with stable mitral valve repair.    ECG: NSR, RBBB, LAFB, old inferior MI  Labs (7/12): LDL 128 Labs (7/13): K 3.9, creatinine 0.9, HDL 54, LDL 133 Labs (4/14): K 4.2, creatinine 1.2, TSH normal, LFTs normal, BNP 132 Labs (5/14): LDL 57, HDL 33 Labs (9/14): HCT 36.9, K 3.5, creatinine 0.87 Labs (10/14): K 3.8, creatinine 1.07,  LDL 57, HDL 47 Labs (5/15): K 3.6, creatinine 1.1  PMH: 1. HTN 2. H/o sinusitis 3. Nephrolithiasis 4. Mitral valve disease: Baseline mitral valve prolapse.  Had MV endocarditis (Strep viridans) in 4/09.  Has had moderate to severe mitral regurgitation.  Echo (7/12): EF 60%, upper normal LV size (EDD 55 mm, ESD 35 mm), moderate (grade II) diastolic dysfunction with E/medial e' < 15, moderate to possibly severe eccentric MR with mitral valve prolapse and partial flail posterior leaflet, mild LAE. Echo (7/13): EF 55-60%, upper normal LV size, grade II diastolic dysfunction with E/e' < 15, IVC normal, flail segment of posterior leaflet with at least moderate and probably severe anteriorly directed MR, normal RV, unable to measure PA systolic pressure.  TEE (2/14) with EF 55-60%, flail P2 and P1 segments of the posterior leaflet with a ruptured chord, eccentric severe MR.  Status post minimally-invasive mitral valve repair in 3/14.  5. CAD: LHC (2/14) showed 95% mRCA stenosis.  This was treated with BMS.  6. Atrial fibrillation: First noted in 4/14. Headaches with Eliquis.  Thigh hematoma with Xarelto.  He now has a loop recorder to assess for recurrent atrial fibrillation, none so far.  7. Cardiomyopathy: Echo (4/14) with EF 35-40%, diffuse hypokinesis, s/p mitral valve repair with no mitral regurgitation.  Echo (7/14) with EF 45-50%, septal hypokinesis, MV repair w/o MR or significant stenosis.  Echo (5/15) with EF  55-60%, s/p MV repair with trivial MR, normal RV size and systolic function.  8. Right-sided pleural effusion post-MV surgery  SH: Works at [employer] on [employer].  Divorced.  Occasional ETOH.  No smoking.   FH: Father with CAD, brother with MI at 66.    ROS: All systems reviewed and negative except as per HPI.   Current Outpatient Prescriptions  Medication Sig Dispense Refill  . aspirin EC 81 MG tablet Take 81 mg by mouth daily.    Marland Kitchen atorvastatin (LIPITOR) 20 MG tablet TAKE 1 TABLET  BY MOUTH EVERY DAY 90 tablet 0  . Cholecalciferol (VITAMIN D) 2000 UNITS CAPS Take 2,000 Units by mouth daily.     . fluticasone (FLONASE) 50 MCG/ACT nasal spray Place 2 sprays into both nostrils daily. 48 g 2  . metoprolol succinate (TOPROL-XL) 100 MG 24 hr tablet TAKE 1 TABLET (100 MG TOTAL) BY MOUTH DAILY. TAKE WITH OR IMMEDIATELY FOLLOWING A MEAL. 90 tablet 0  . ramipril (ALTACE) 10 MG capsule TAKE ONE CAPSULE DAILY 90 capsule 2   No current facility-administered medications for this visit.    BP 140/76 mmHg  Pulse 69  Ht 5\' 11"  (1.803 m)  Wt 231 lb (104.781 kg)  BMI 32.23 kg/m2 General: NAD Neck: No JVD, no thyromegaly or thyroid nodule.  Lungs: Slight decreased breath sounds right base. CV: Nondisplaced PMI.  Heart regular S1/S2, no S3/S4, no significant murmur.  No peripheral edema.  No carotid bruit.  Normal pedal pulses.  Abdomen: Soft, nontender, no hepatosplenomegaly, no distention.  Neurologic: Alert and oriented x 3.  Psych: Normal affect. Extremities: No clubbing or cyanosis.   Assessment/Plan:  Mitral valve regurgitation  Severe eccentric MR in the setting of MV prolapse and history of MV endocarditis, now s/p minimally invasive MV repair.  The valve looked good on 5/15 echo.   Atrial fibrillation He did not tolerate Eliquis due to headaches and developed a thigh hematoma spontaneously on Xarelto.  He is now off anticoagulation.  He had post-op atrial fibrillation after MVR, but the atrial fibrillation occurred relatively far out from the procedure.  He continues to be in NSR.  No symptoms concerning for atrial fibrillation at this time.  He has a loop recorder that has shown no recurrences of atrial fibrillation so far. If he has a recurrence will need to revisit starting an anticoagulant.  If not, given spontaneous thigh hematoma, would hold off.   CAD Status post PCI to [facility] with BMS.  Stable, no significant ischemic chest pain.  Continue ASA, statin.   Hyperlipidemia Statin begun with CAD.  Check lipids today.   Cardiomyopathy EF back to normal on most recent echo. - Continue ramipril and Toprol XL at current doses.   Followup in 1 year.    Loralie Champagne 05/10/2014

## 2014-05-11 NOTE — Addendum Note (Signed)
Addended by: Katrine Coho on: 05/11/2014 09:22 AM   Modules accepted: Orders

## 2014-06-08 ENCOUNTER — Ambulatory Visit: Payer: BC Managed Care – PPO | Admitting: *Deleted

## 2014-06-08 DIAGNOSIS — I480 Paroxysmal atrial fibrillation: Secondary | ICD-10-CM

## 2014-06-10 ENCOUNTER — Other Ambulatory Visit: Payer: Self-pay | Admitting: Cardiology

## 2014-06-12 ENCOUNTER — Encounter: Payer: Self-pay | Admitting: Internal Medicine

## 2014-06-12 ENCOUNTER — Other Ambulatory Visit: Payer: Self-pay | Admitting: Cardiology

## 2014-06-12 ENCOUNTER — Other Ambulatory Visit: Payer: Self-pay | Admitting: Internal Medicine

## 2014-06-13 NOTE — Progress Notes (Signed)
Loop recorder 

## 2014-06-14 ENCOUNTER — Encounter (HOSPITAL_COMMUNITY): Payer: Self-pay | Admitting: Cardiology

## 2014-07-09 ENCOUNTER — Ambulatory Visit (INDEPENDENT_AMBULATORY_CARE_PROVIDER_SITE_OTHER): Payer: BLUE CROSS/BLUE SHIELD | Admitting: *Deleted

## 2014-07-09 DIAGNOSIS — I480 Paroxysmal atrial fibrillation: Secondary | ICD-10-CM

## 2014-07-13 NOTE — Progress Notes (Signed)
Loop recorder 

## 2014-07-23 LAB — MDC_IDC_ENUM_SESS_TYPE_REMOTE

## 2014-07-26 ENCOUNTER — Encounter: Payer: Self-pay | Admitting: Internal Medicine

## 2014-08-08 ENCOUNTER — Ambulatory Visit: Payer: BLUE CROSS/BLUE SHIELD | Admitting: *Deleted

## 2014-08-08 DIAGNOSIS — I480 Paroxysmal atrial fibrillation: Secondary | ICD-10-CM

## 2014-08-09 ENCOUNTER — Encounter: Payer: Self-pay | Admitting: Internal Medicine

## 2014-08-10 NOTE — Progress Notes (Signed)
Loop recorder 

## 2014-09-25 ENCOUNTER — Other Ambulatory Visit: Payer: Self-pay | Admitting: Cardiology

## 2014-10-03 ENCOUNTER — Other Ambulatory Visit: Payer: Self-pay | Admitting: Cardiology

## 2014-10-05 ENCOUNTER — Other Ambulatory Visit: Payer: Self-pay | Admitting: Cardiology

## 2014-11-08 ENCOUNTER — Ambulatory Visit (INDEPENDENT_AMBULATORY_CARE_PROVIDER_SITE_OTHER): Payer: BLUE CROSS/BLUE SHIELD | Admitting: *Deleted

## 2014-11-08 DIAGNOSIS — I480 Paroxysmal atrial fibrillation: Secondary | ICD-10-CM | POA: Diagnosis not present

## 2014-11-13 ENCOUNTER — Encounter: Payer: Self-pay | Admitting: Internal Medicine

## 2014-11-13 ENCOUNTER — Encounter: Payer: BC Managed Care – PPO | Admitting: Internal Medicine

## 2014-11-13 ENCOUNTER — Ambulatory Visit (INDEPENDENT_AMBULATORY_CARE_PROVIDER_SITE_OTHER): Payer: BLUE CROSS/BLUE SHIELD | Admitting: Internal Medicine

## 2014-11-13 ENCOUNTER — Other Ambulatory Visit (INDEPENDENT_AMBULATORY_CARE_PROVIDER_SITE_OTHER): Payer: BLUE CROSS/BLUE SHIELD

## 2014-11-13 VITALS — BP 158/100 | HR 84 | Temp 98.4°F | Resp 16 | Ht 70.0 in | Wt 230.8 lb

## 2014-11-13 DIAGNOSIS — Z0000 Encounter for general adult medical examination without abnormal findings: Secondary | ICD-10-CM | POA: Diagnosis not present

## 2014-11-13 DIAGNOSIS — E785 Hyperlipidemia, unspecified: Secondary | ICD-10-CM

## 2014-11-13 DIAGNOSIS — I2510 Atherosclerotic heart disease of native coronary artery without angina pectoris: Secondary | ICD-10-CM

## 2014-11-13 DIAGNOSIS — I10 Essential (primary) hypertension: Secondary | ICD-10-CM

## 2014-11-13 DIAGNOSIS — Z23 Encounter for immunization: Secondary | ICD-10-CM

## 2014-11-13 LAB — CBC
HCT: 45.3 % (ref 39.0–52.0)
Hemoglobin: 15.4 g/dL (ref 13.0–17.0)
MCHC: 34.1 g/dL (ref 30.0–36.0)
MCV: 86.3 fl (ref 78.0–100.0)
Platelets: 155 10*3/uL (ref 150.0–400.0)
RBC: 5.24 Mil/uL (ref 4.22–5.81)
RDW: 13.7 % (ref 11.5–15.5)
WBC: 5.4 10*3/uL (ref 4.0–10.5)

## 2014-11-13 LAB — COMPREHENSIVE METABOLIC PANEL
ALT: 22 U/L (ref 0–53)
AST: 20 U/L (ref 0–37)
Albumin: 3.9 g/dL (ref 3.5–5.2)
Alkaline Phosphatase: 64 U/L (ref 39–117)
BUN: 11 mg/dL (ref 6–23)
CO2: 29 mEq/L (ref 19–32)
Calcium: 8.6 mg/dL (ref 8.4–10.5)
Chloride: 104 mEq/L (ref 96–112)
Creatinine, Ser: 0.89 mg/dL (ref 0.40–1.50)
GFR: 91.11 mL/min (ref >60.00)
Glucose, Bld: 112 mg/dL — ABNORMAL HIGH (ref 70–99)
Potassium: 3.6 mEq/L (ref 3.5–5.1)
Sodium: 140 mEq/L (ref 135–145)
Total Bilirubin: 0.9 mg/dL (ref 0.2–1.2)
Total Protein: 7 g/dL (ref 6.0–8.3)

## 2014-11-13 LAB — LIPID PANEL
Cholesterol: 112 mg/dL (ref 0–200)
HDL: 37.9 mg/dL — ABNORMAL LOW (ref >39.00)
LDL Cholesterol: 49 mg/dL (ref 0–99)
NonHDL: 74.1
Total CHOL/HDL Ratio: 3
Triglycerides: 125 mg/dL (ref 0.0–149.0)
VLDL: 25 mg/dL (ref 0.0–40.0)

## 2014-11-13 MED ORDER — TADALAFIL 5 MG PO TABS: 5.0000 mg | ORAL_TABLET | Freq: Every day | ORAL | Status: DC

## 2014-11-13 NOTE — Assessment & Plan Note (Signed)
Checking lipid panel today and LFTs. No side effects from his statin. Will continue and adjust as needed his lipitor.

## 2014-11-13 NOTE — Assessment & Plan Note (Signed)
BP mildly elevated today and per his reports it does not run high at home. Will have him come back sooner to recheck his BP. Continue metoprolol and ramipril for now. Can increase ramipril if his BP is still elevated at next visit.

## 2014-11-13 NOTE — Progress Notes (Signed)
Pre visit review using our clinic review tool, if applicable. No additional management support is needed unless otherwise documented below in the visit note. 

## 2014-11-13 NOTE — Assessment & Plan Note (Signed)
Taking ASA daily, on beta blocker and ACE-I. No chest pains. Needs more exercise and talked to him about that.

## 2014-11-13 NOTE — Patient Instructions (Signed)
We have sent in the cialis which you will take daily. We may need to do a prior authorization for the insurance company to get them to cover it.   We are checking your labs today and have given you the pneumonia booster shot.   Come back in about 6-12 months and please feel free to call us sooner if you have problems or questions.   Health Maintenance A healthy lifestyle and preventative care can promote health and wellness.  Maintain regular health, dental, and eye exams.  Eat a healthy diet. Foods like vegetables, fruits, whole grains, low-fat dairy products, and lean protein foods contain the nutrients you need and are low in calories. Decrease your intake of foods high in solid fats, added sugars, and salt. Get information about a proper diet from your health care provider, if necessary.  Regular physical exercise is one of the most important things you can do for your health. Most adults should get at least 150 minutes of moderate-intensity exercise (any activity that increases your heart rate and causes you to sweat) each week. In addition, most adults need muscle-strengthening exercises on 2 or more days a week.   Maintain a healthy weight. The body mass index (BMI) is a screening tool to identify possible weight problems. It provides an estimate of body fat based on height and weight. Your health care provider can find your BMI and can help you achieve or maintain a healthy weight. For males 20 years and older:  A BMI below 18.5 is considered underweight.  A BMI of 18.5 to 24.9 is normal.  A BMI of 25 to 29.9 is considered overweight.  A BMI of 30 and above is considered obese.  Maintain normal blood lipids and cholesterol by exercising and minimizing your intake of saturated fat. Eat a balanced diet with plenty of fruits and vegetables. Blood tests for lipids and cholesterol should begin at age 33 and be repeated every 5 years. If your lipid or cholesterol levels are high, you are  over age 65, or you are at high risk for heart disease, you may need your cholesterol levels checked more frequently.Ongoing high lipid and cholesterol levels should be treated with medicines if diet and exercise are not working.  If you smoke, find out from your health care provider how to quit. If you do not use tobacco, do not start.  Lung cancer screening is recommended for adults aged 30-80 years who are at high risk for developing lung cancer because of a history of smoking. A yearly low-dose CT scan of the lungs is recommended for people who have at least a 30-pack-year history of smoking and are current smokers or have quit within the past 15 years. A pack year of smoking is smoking an average of 1 pack of cigarettes a day for 1 year (for example, a 30-pack-year history of smoking could mean smoking 1 pack a day for 30 years or 2 packs a day for 15 years). Yearly screening should continue until the smoker has stopped smoking for at least 15 years. Yearly screening should be stopped for people who develop a health problem that would prevent them from having lung cancer treatment.  If you choose to drink alcohol, do not have more than 2 drinks per day. One drink is considered to be 12 oz (360 mL) of beer, 5 oz (150 mL) of wine, or 1.5 oz (45 mL) of liquor.  Avoid the use of street drugs. Do not share needles with  anyone. Ask for help if you need support or instructions about stopping the use of drugs.  High blood pressure causes heart disease and increases the risk of stroke. Blood pressure should be checked at least every 1-2 years. Ongoing high blood pressure should be treated with medicines if weight loss and exercise are not effective.  If you are 70-20 years old, ask your health care provider if you should take aspirin to prevent heart disease.  Diabetes screening involves taking a blood sample to check your fasting blood sugar level. This should be done once every 3 years after age 12 if  you are at a normal weight and without risk factors for diabetes. Testing should be considered at a younger age or be carried out more frequently if you are overweight and have at least 1 risk factor for diabetes.  Colorectal cancer can be detected and often prevented. Most routine colorectal cancer screening begins at the age of 57 and continues through age 38. However, your health care provider may recommend screening at an earlier age if you have risk factors for colon cancer. On a yearly basis, your health care provider may provide home test kits to check for hidden blood in the stool. A small camera at the end of a tube may be used to directly examine the colon (sigmoidoscopy or colonoscopy) to detect the earliest forms of colorectal cancer. Talk to your health care provider about this at age 83 when routine screening begins. A direct exam of the colon should be repeated every 5-10 years through age 26, unless early forms of precancerous polyps or small growths are found.  People who are at an increased risk for hepatitis B should be screened for this virus. You are considered at high risk for hepatitis B if:  You were born in a country where hepatitis B occurs often. Talk with your health care provider about which countries are considered high risk.  Your parents were born in a high-risk country and you have not received a shot to protect against hepatitis B (hepatitis B vaccine).  You have HIV or AIDS.  You use needles to inject street drugs.  You live with, or have sex with, someone who has hepatitis B.  You are a man who has sex with other men (MSM).  You get hemodialysis treatment.  You take certain medicines for conditions like cancer, organ transplantation, and autoimmune conditions.  Hepatitis C blood testing is recommended for all people born from [birth year] through 1966 and any individual with known risk factors for hepatitis C.  Healthy men should no longer receive prostate-specific  antigen (PSA) blood tests as part of routine cancer screening. Talk to your health care provider about prostate cancer screening.  Testicular cancer screening is not recommended for adolescents or adult males who have no symptoms. Screening includes self-exam, a health care provider exam, and other screening tests. Consult with your health care provider about any symptoms you have or any concerns you have about testicular cancer.  Practice safe sex. Use condoms and avoid high-risk sexual practices to reduce the spread of sexually transmitted infections (STIs).  You should be screened for STIs, including gonorrhea and chlamydia if:  You are sexually active and are younger than 24 years.  You are older than 24 years, and your health care provider tells you that you are at risk for this type of infection.  Your sexual activity has changed since you were last screened, and you are at an increased risk for  chlamydia or gonorrhea. Ask your health care provider if you are at risk.  If you are at risk of being infected with HIV, it is recommended that you take a prescription medicine daily to prevent HIV infection. This is called pre-exposure prophylaxis (PrEP). You are considered at risk if:  You are a man who has sex with other men (MSM).  You are a heterosexual man who is sexually active with multiple partners.  You take drugs by injection.  You are sexually active with a partner who has HIV.  Talk with your health care provider about whether you are at high risk of being infected with HIV. If you choose to begin PrEP, you should first be tested for HIV. You should then be tested every 3 months for as long as you are taking PrEP.  Use sunscreen. Apply sunscreen liberally and repeatedly throughout the day. You should seek shade when your shadow is shorter than you. Protect yourself by wearing long sleeves, pants, a wide-brimmed hat, and sunglasses year round whenever you are outdoors.  Tell  your health care provider of new moles or changes in moles, especially if there is a change in shape or color. Also, tell your health care provider if a mole is larger than the size of a pencil eraser.  A one-time screening for abdominal aortic aneurysm (AAA) and surgical repair of large AAAs by ultrasound is recommended for men aged 37-75 years who are current or former smokers.  Stay current with your vaccines (immunizations). Document Released: 12/19/2007 Document Revised: 06/27/2013 Document Reviewed: 11/17/2010 [facility] Patient Information 2015 [place], [place]. This information is not intended to replace advice given to you by your health care provider. Make sure you discuss any questions you have with your health care provider.

## 2014-11-13 NOTE — Progress Notes (Signed)
   Subjective:    Patient ID: Christopher [name], male    DOB: 1950/09/02, 64 y.o.   MRN: [id number]  HPI The patient is a 64 YO man coming in for wellness. Having some ED and urination quite often. He gets up 4-5 times per night to use the bathroom. Occasionally he is not able to make it to the bathroom. Doing less activity at his job and has gained 10-15 pounds in the last 1-2 years.   PMH, [facility], social history reviewed and updated.   Review of Systems  Constitutional: Negative for diaphoresis, activity change, appetite change, fatigue and unexpected weight change.  HENT: Negative.   Eyes: Negative.   Respiratory: Negative for cough, chest tightness, shortness of breath and wheezing.   Cardiovascular: Negative for chest pain, palpitations and leg swelling.  Gastrointestinal: Negative for nausea, abdominal pain, diarrhea, constipation and abdominal distention.  Genitourinary: Positive for urgency, frequency and enuresis. Negative for dysuria, hematuria, flank pain, discharge and difficulty urinating.  Musculoskeletal: Positive for arthralgias. Negative for myalgias and gait problem.  Skin: Negative.   Neurological: Negative.   Psychiatric/Behavioral: Negative.       Objective:   Physical Exam  Constitutional: He is oriented to person, place, and time. He appears well-developed and well-nourished.  Overweight  HENT:  Head: Normocephalic and atraumatic.  Eyes: EOM are normal.  Neck: Normal range of motion.  Cardiovascular: Normal rate and regular rhythm.   Carotids without bruit bilaterally  Pulmonary/Chest: Effort normal and breath sounds normal. No respiratory distress. He has no wheezes.  Abdominal: Soft. Bowel sounds are normal.  Musculoskeletal: He exhibits no edema.  Neurological: He is alert and oriented to person, place, and time.  Skin: Skin is warm and dry.  Psychiatric: He has a normal mood and affect.   Filed Vitals:   11/13/14 0800  BP: 158/100  Pulse: 84  Temp: 98.4  F (36.9 C)  TempSrc: Oral  Resp: 16  Height: 5\' 10"  (1.778 m)  Weight: 230 lb 12.8 oz (104.69 kg)  SpO2: 96%      Assessment & Plan:  Prevnar given at visit.

## 2014-11-13 NOTE — Assessment & Plan Note (Signed)
Given prevnar to complete pneumonia series. BP initially elevated but recheck more normal. Per his reports normally 120-130/70s at home. Up to date on immunizations. Colonoscopy due in 2019.

## 2014-11-16 NOTE — Progress Notes (Signed)
Loop recorder 

## 2014-11-27 IMAGING — CR DG ABDOMEN ACUTE W/ 1V CHEST
3 series · 3 of 3 positions shown · non-contrast
Comparison: 09/28/2012

CLINICAL DATA: Status post CABG, nausea/vomiting

ACUTE ABDOMEN SERIES (ABDOMEN 2 VIEW & CHEST 1 VIEW)

[w abdomen decub]
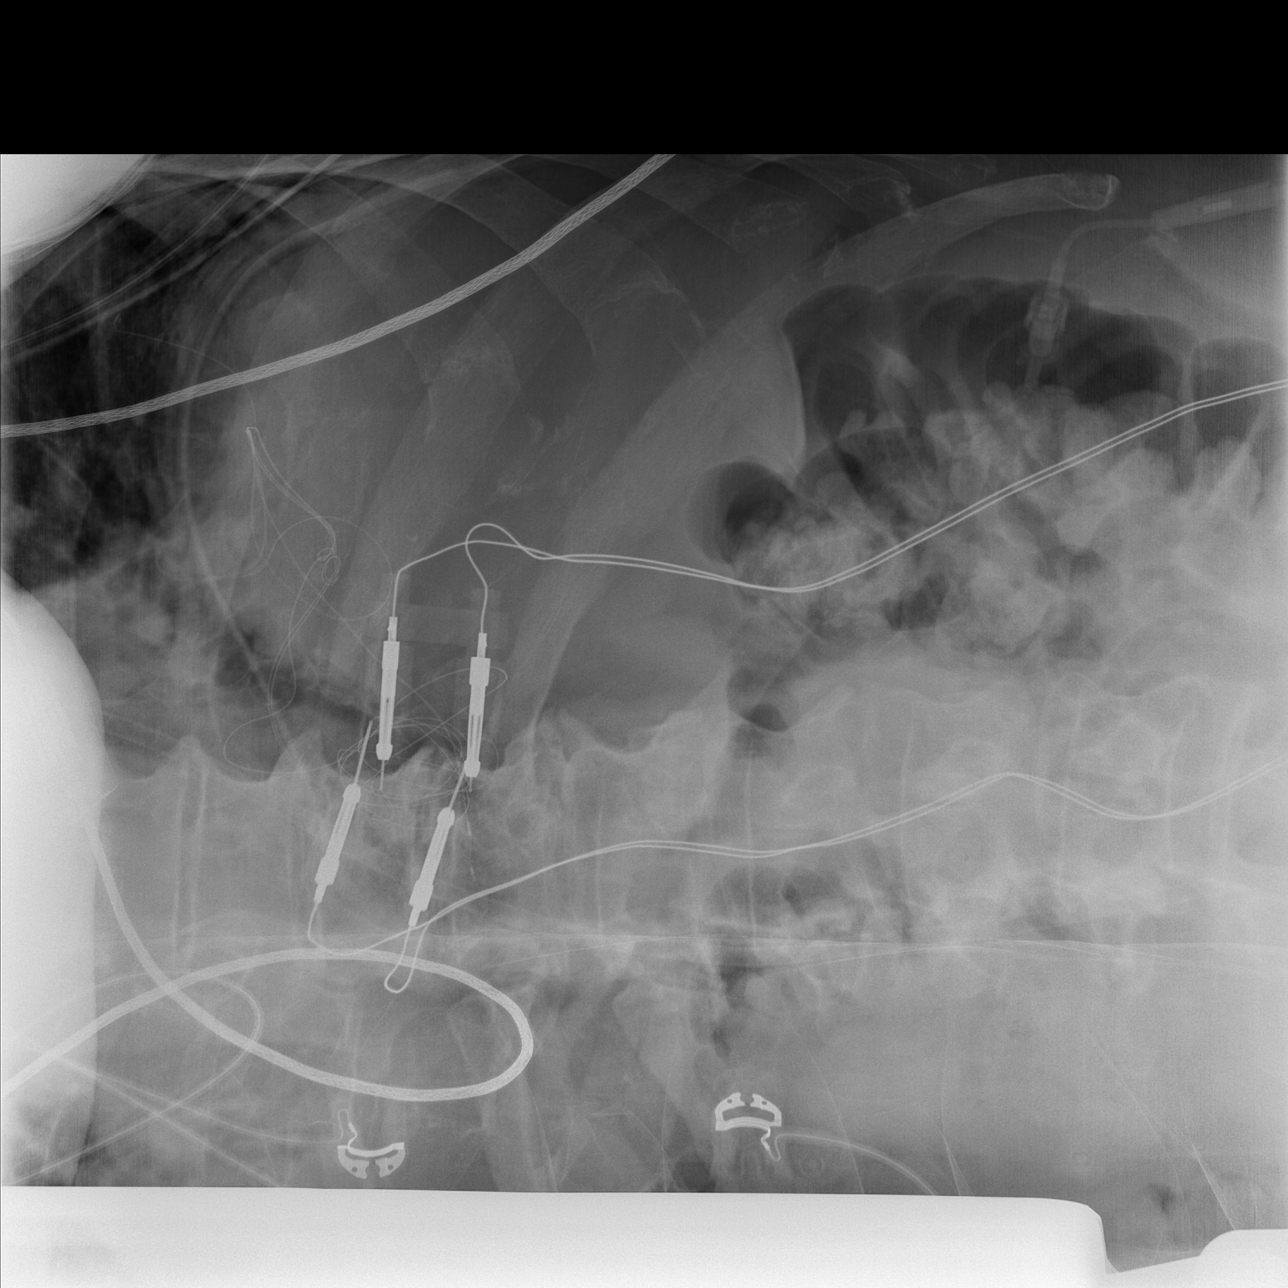

[t chest supine]
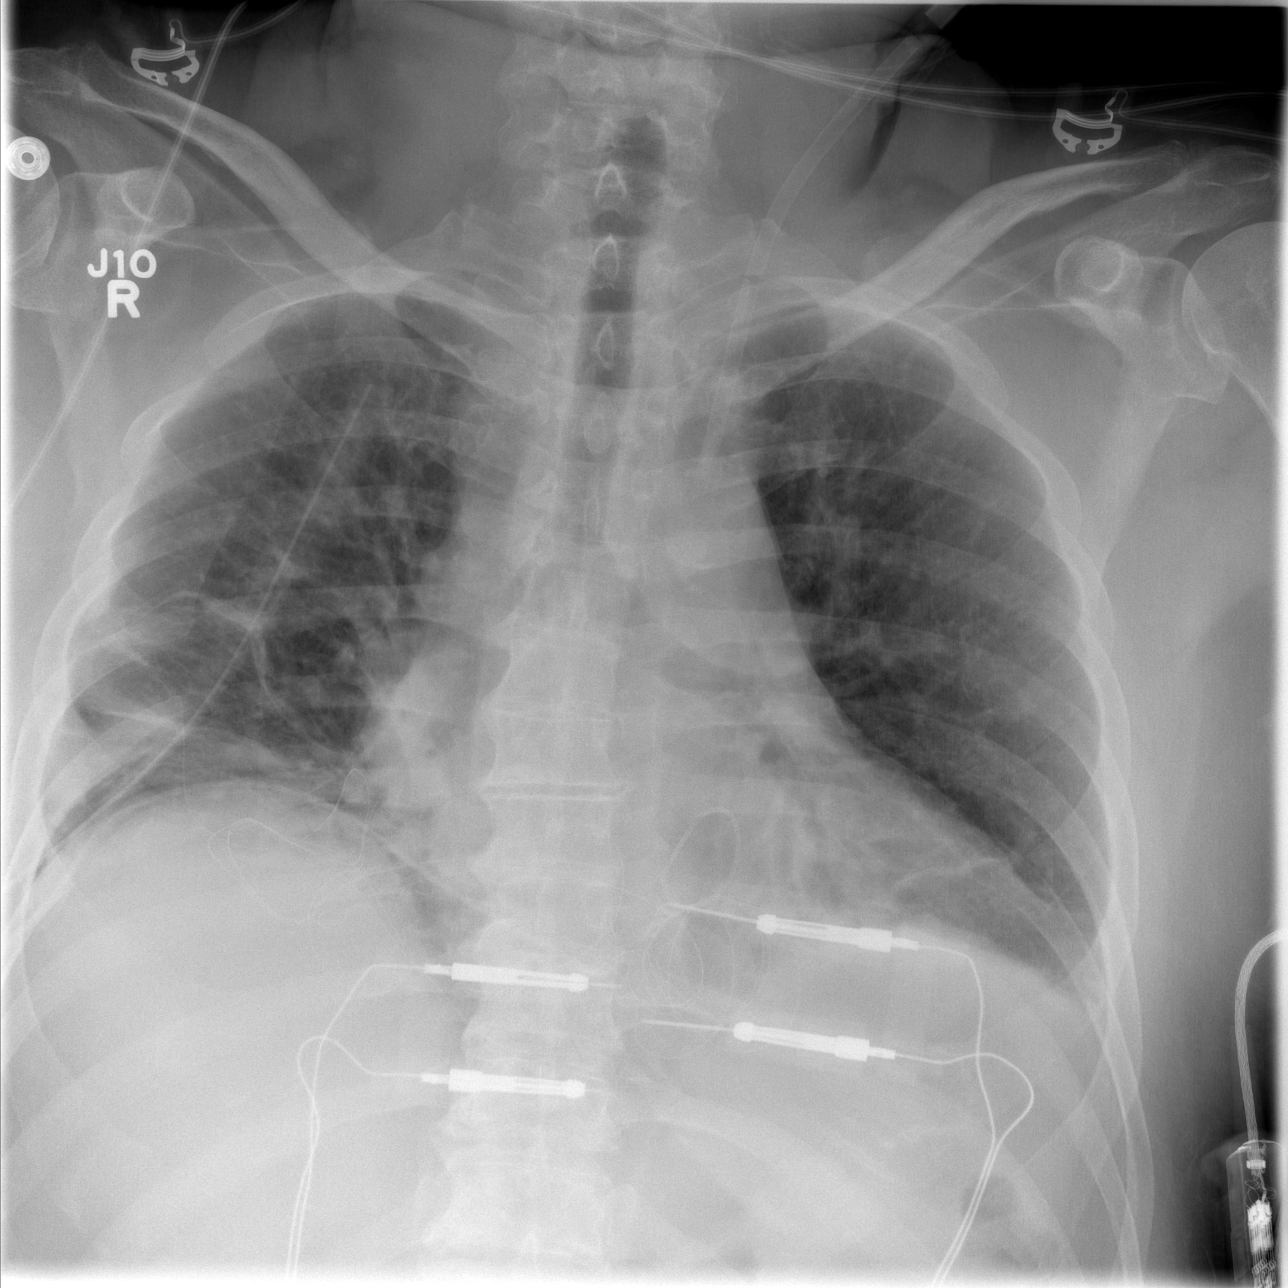

[t abdomen supine]
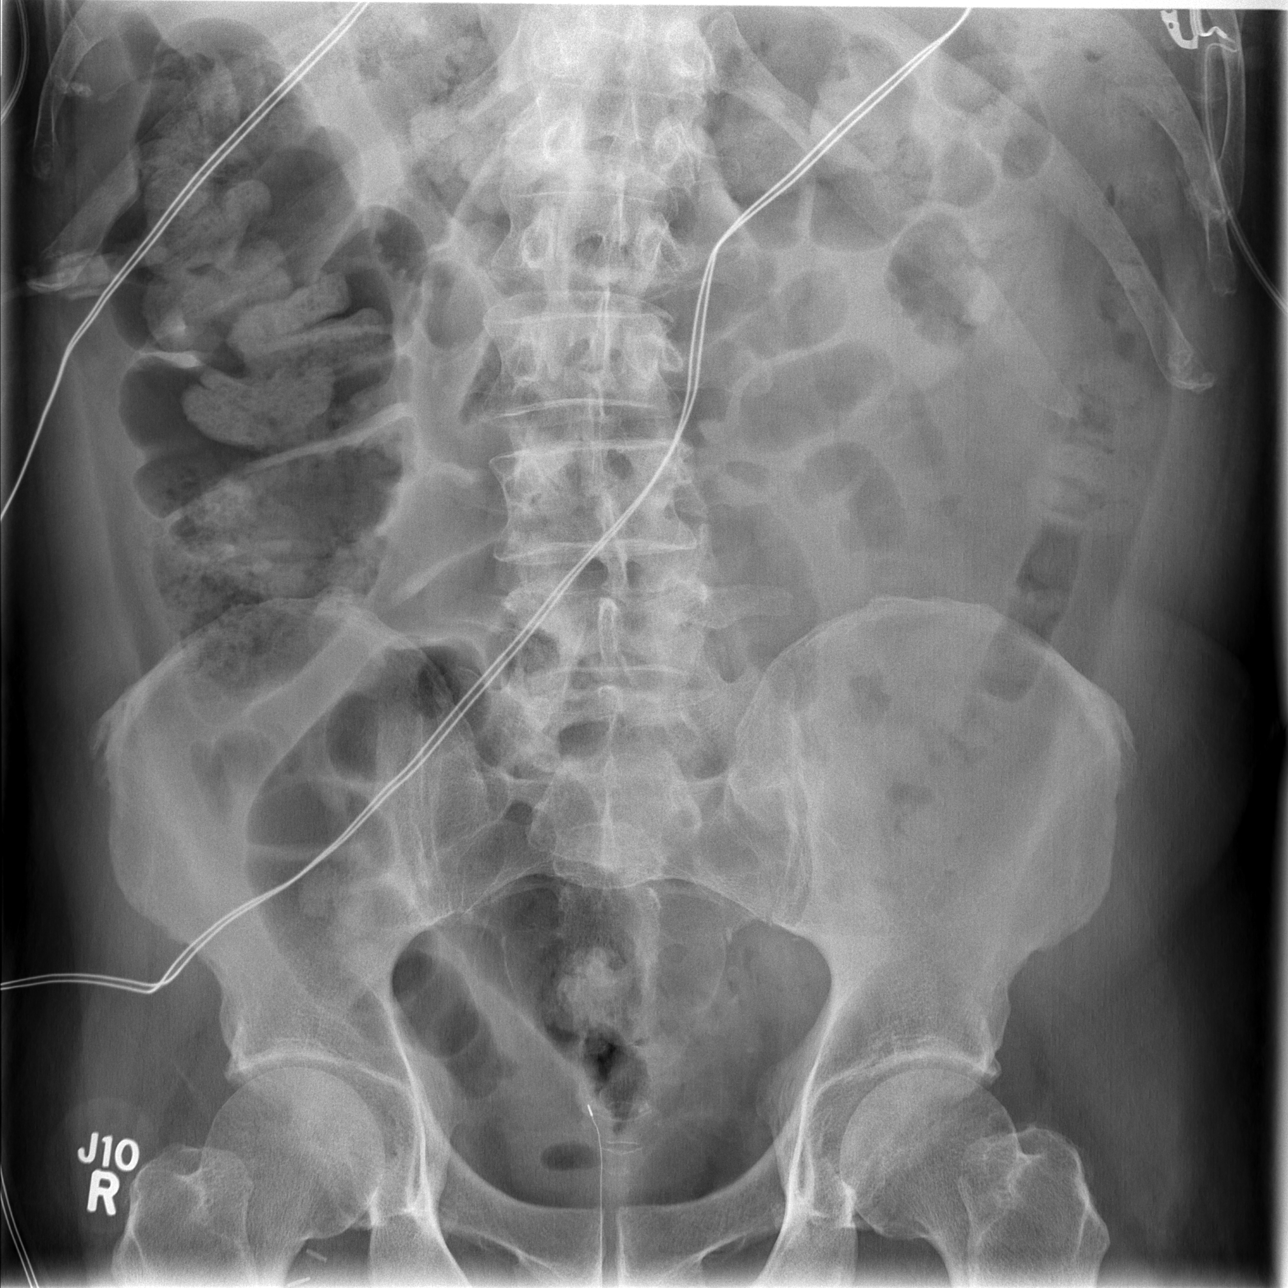

[3 of 3 positions shown; findings below may reference images not displayed]

FINDINGS: Low lung volumes with bibasilar atelectasis.  Right
apical chest tube.  No pneumothorax is seen.

Interval removal of left IJ Swan-Ganz catheter.  Left IJ venous
sheath.

Mild cardiomegaly.  Valve prosthesis.

Nonobstructive bowel gas pattern.

No evidence of free air on the lateral decubitus view.

Degenerative changes of the visualized thoracolumbar spine.
IMPRESSION: Low lung volumes with bibasilar atelectasis.

Right apical chest tube.  No pneumothorax is seen.

No evidence of small bowel obstruction or free air.

## 2014-11-27 IMAGING — CR DG CHEST 1V PORT
1 series · 1 of 1 positions shown · non-contrast
Comparison: Chest x-ray 09/27/2012.

CLINICAL DATA: Minimally [place] mitral valve repair.

PORTABLE CHEST - 1 VIEW

[AP]
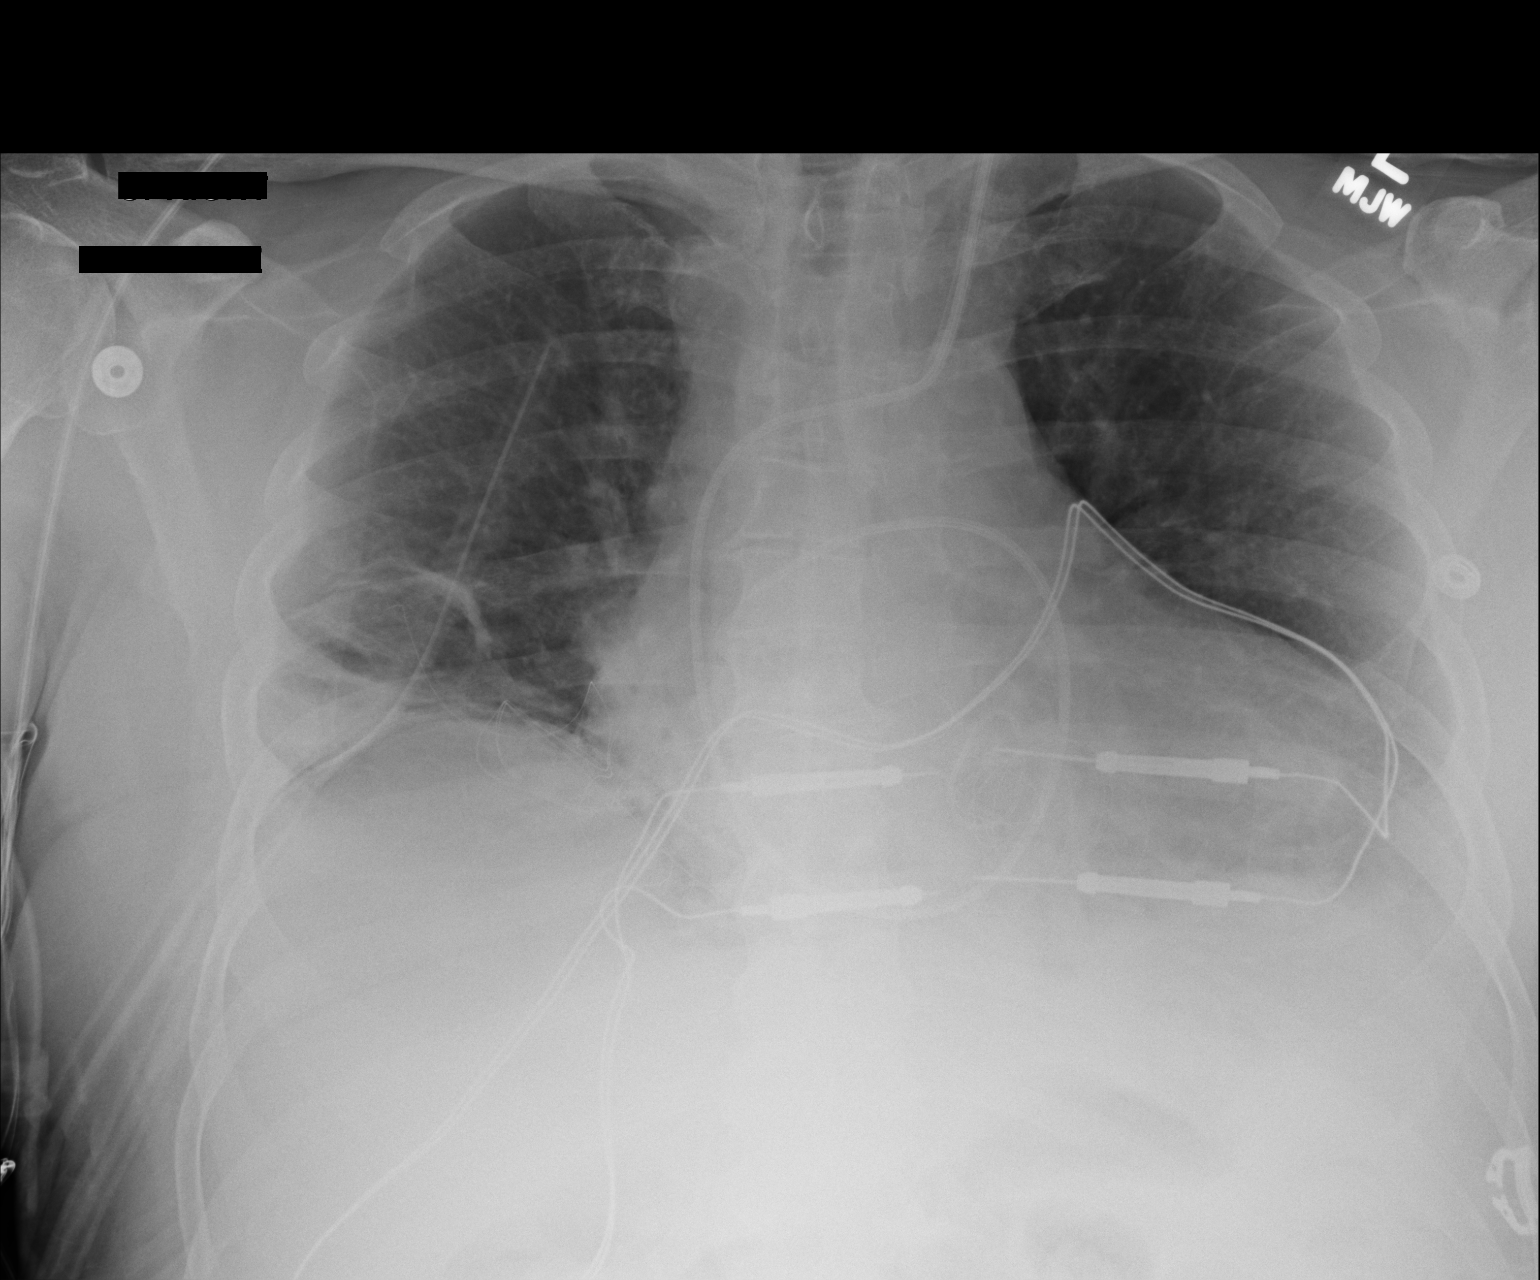

[1 of 1 positions shown; findings below may reference images not displayed]

FINDINGS: Previously noted nasogastric and endotracheal tubes have
been removed.  Right-sided chest tube has been withdrawn slightly,
but remains properly located.  There is a small lucency at the base
of the right hemithorax which could represent a tiny basilar right-
sided pneumothorax.  Left-sided internal jugular central venous
Cordis in position through which a Swan Ganz catheter has been
passed into the right descending pulmonary artery branch.
Postoperative changes of mitral valve repair with a neoplastic ring
in place.  The epicardial pacing wires remain in position.  Lung
volumes are low with a linear opacities throughout the mid and
lower lungs bilaterally, most compatible with resolving
postoperative subsegmental atelectasis.  Trace bilateral pleural
effusions.  Pulmonary venous congestion, without frank pulmonary
edema.  Heart size is mildly enlarged. The patient is rotated to
the left on today's exam, resulting in distortion of the
mediastinal contours and reduced diagnostic sensitivity and
specificity for mediastinal pathology.  Atherosclerosis in the
thoracic aorta.
IMPRESSION: 1.  Support apparatus, as above.
2.  Probable trace right basilar pneumothorax.  Attention on follow-
up studies is recommended.  Right-sided chest tube appears properly
located.
3.  Low lung volumes with bibasilar subsegmental atelectasis and
small bilateral pleural effusions.

## 2014-11-28 IMAGING — CR DG CHEST 1V PORT
1 series · 1 of 1 positions shown · non-contrast
Comparison: 09/28/2012 acute abdomen series

CLINICAL DATA: Hypertension.  Status post CABG.  Nausea and
vomiting.

PORTABLE CHEST - 1 VIEW

[AP]
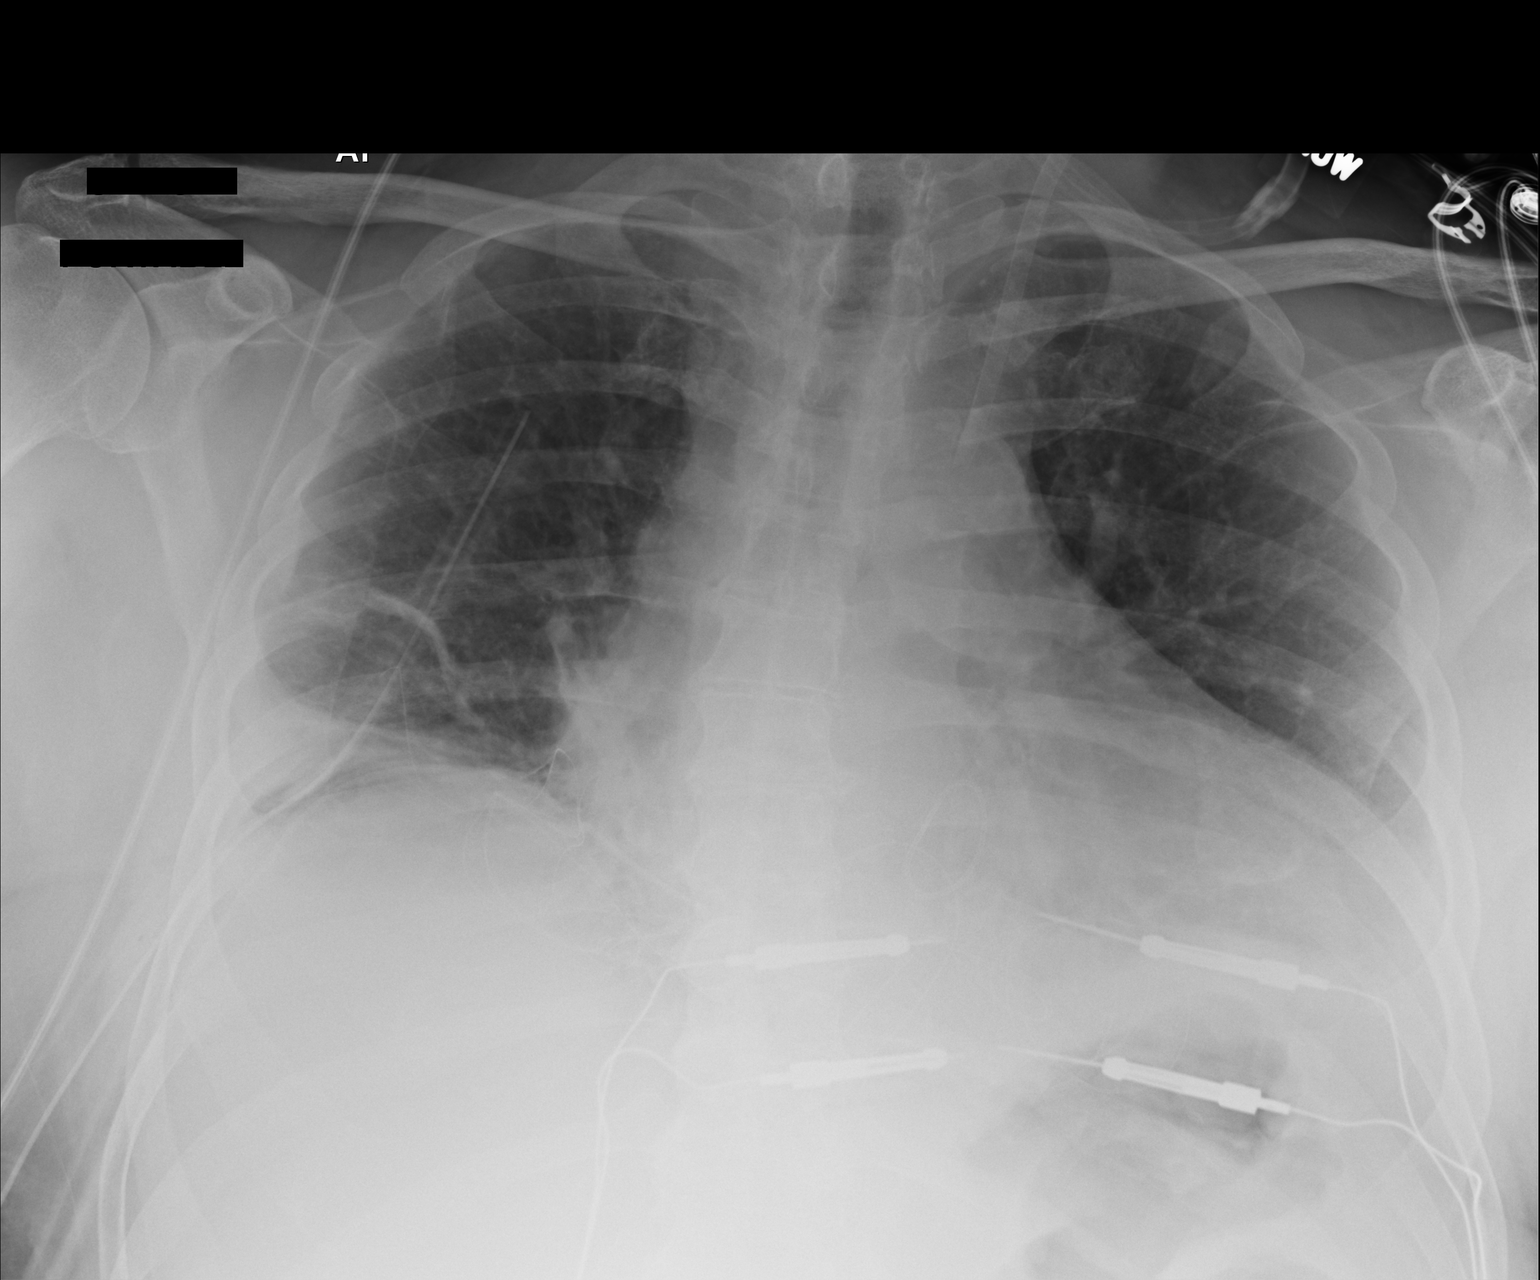

[1 of 1 positions shown; findings below may reference images not displayed]

FINDINGS: Right-sided chest tube remains in place.  Epicardial
pacer wires.  A left IJ Swan-Ganz catheter.

Cardiomegaly accentuated by AP portable technique.  Small left and
developing small right pleural effusion.  No apical pneumothorax.
There may be a small amount of pleural air along the right
hemidiaphragm.

No congestive failure.

Patchy bibasilar atelectasis which is not significantly changed.
Valve prosthesis.
IMPRESSION: Right-sided chest tube remaining in place, without apical
pneumothorax.  Cannot exclude small volume subpulmonic pleural air.

Suspect small bilateral pleural effusions with patchy bibasilar
atelectasis.

## 2014-11-29 LAB — CUP PACEART REMOTE DEVICE CHECK: Date Time Interrogation Session: 20160526131941

## 2014-11-29 IMAGING — CR DG CHEST 2V
2 series · 2 of 2 positions shown · non-contrast
Comparison: 09/29/2012

CLINICAL DATA: Status post right chest tube removal

CHEST - 2 VIEW

[w chest pa]
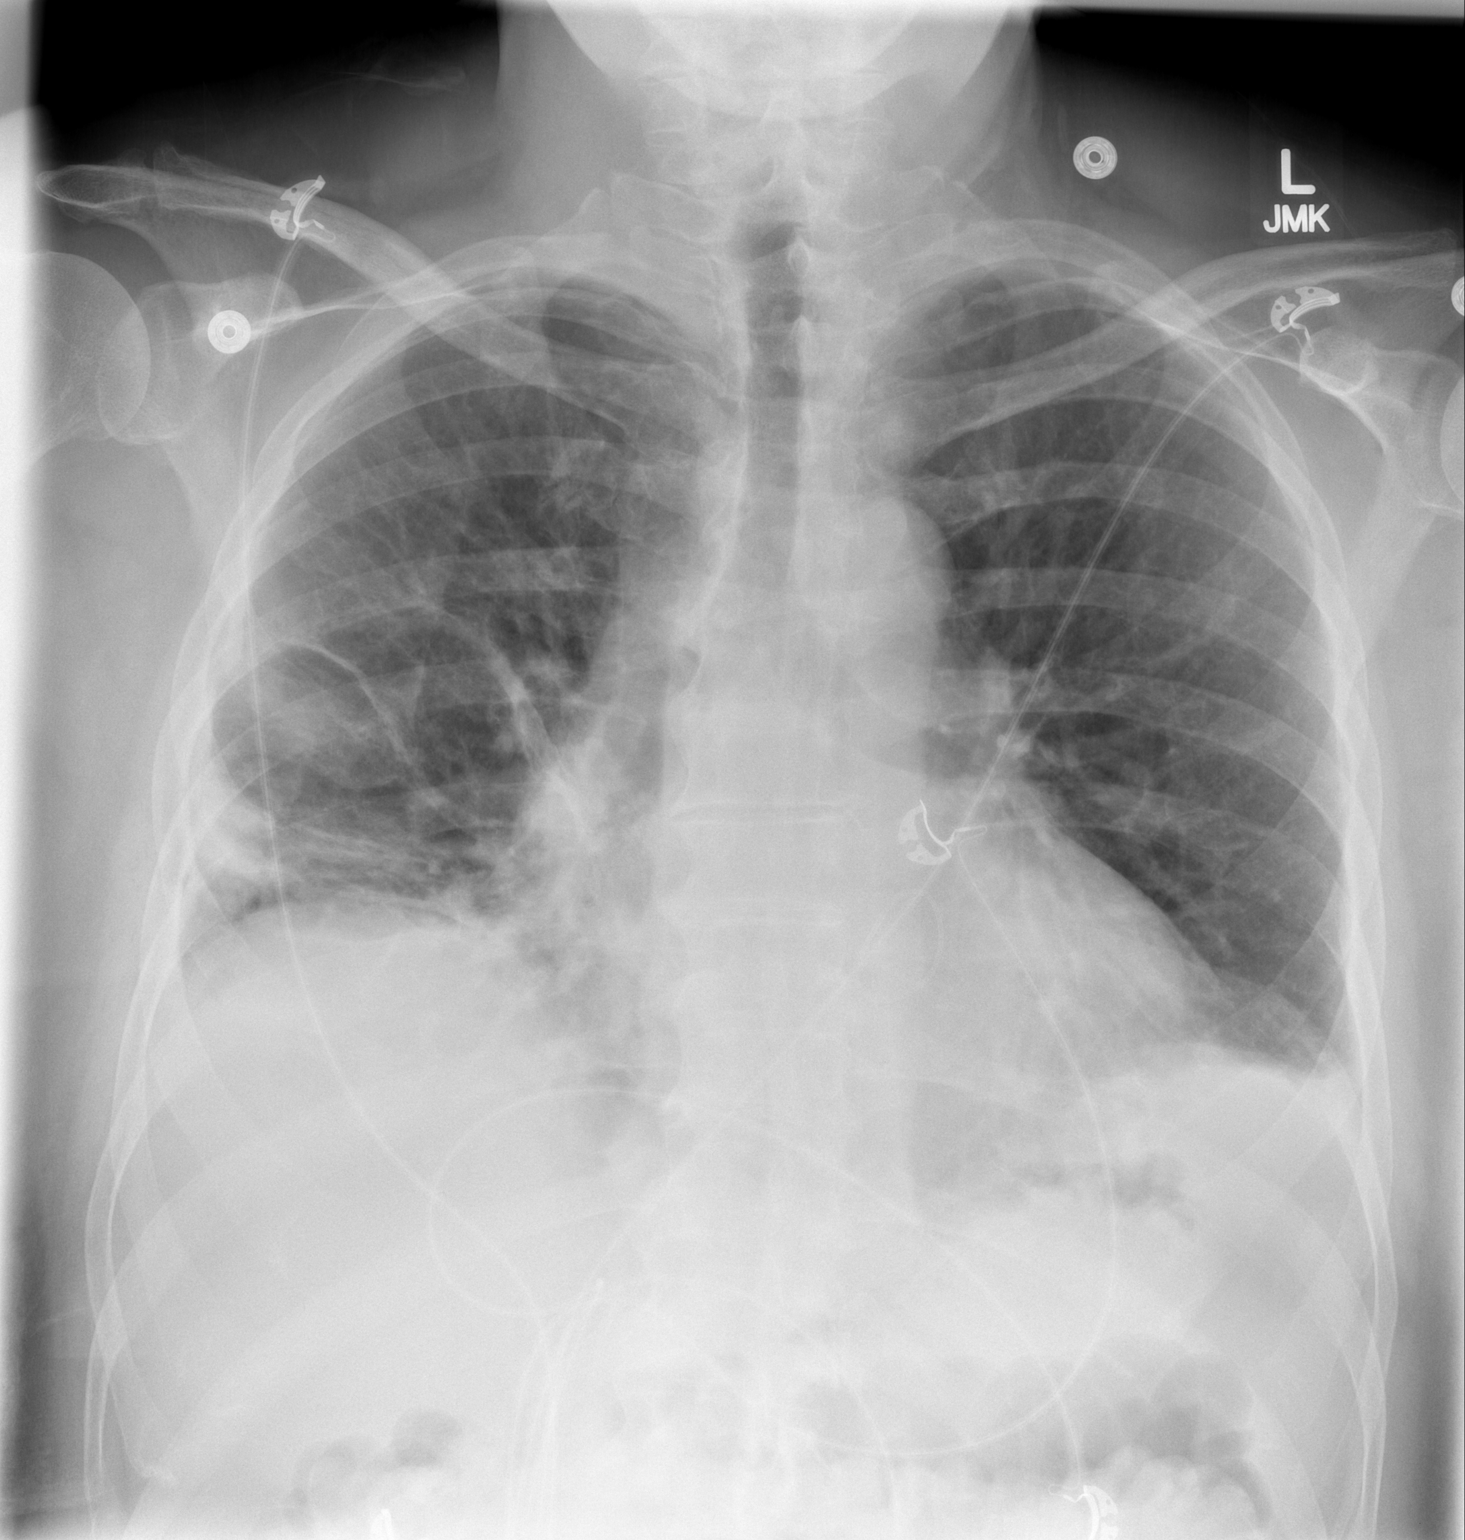

[w chest lat]
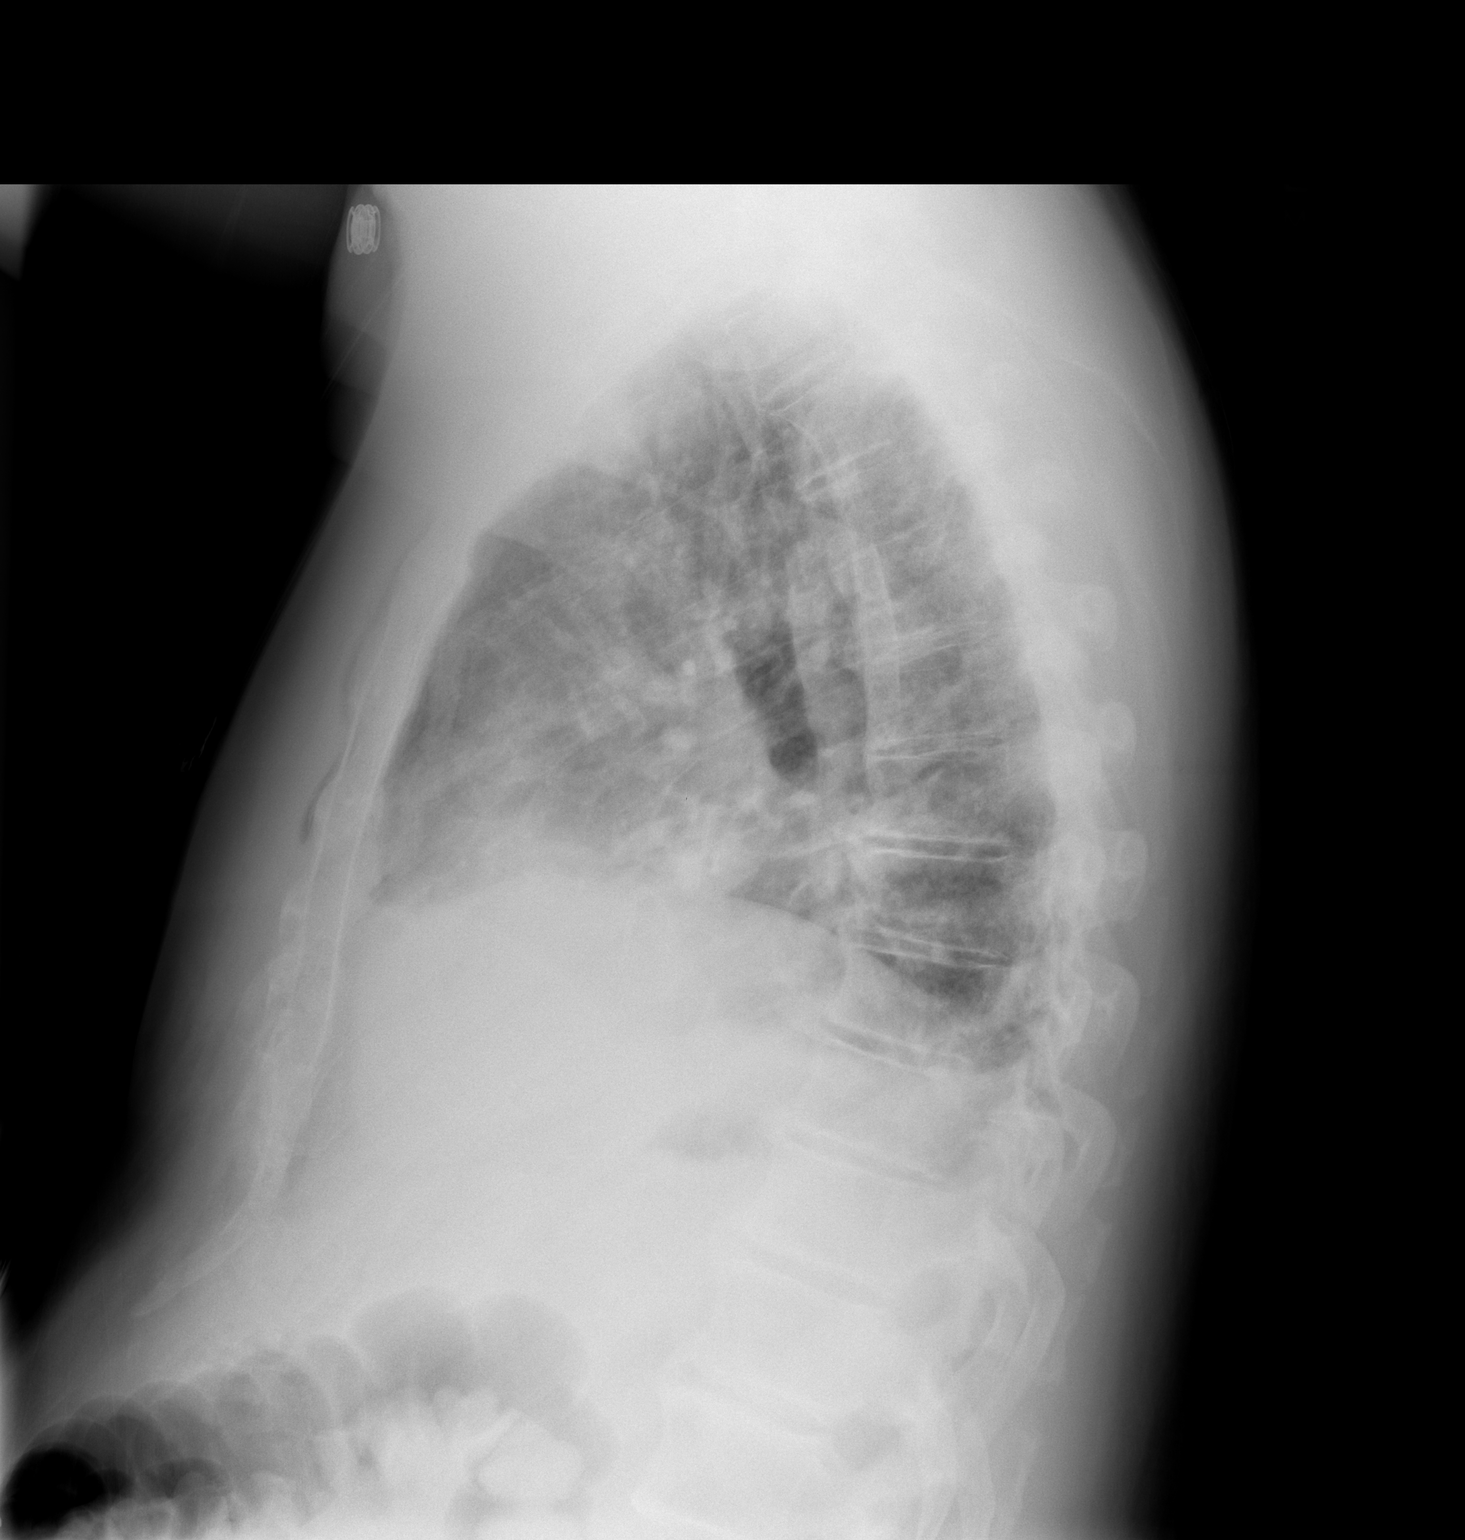

[2 of 2 positions shown; findings below may reference images not displayed]

FINDINGS: The right-sided chest tube and left jugular sheath have
been removed in the interval.  Persistent changes are noted in the
right lung base.  No pneumothorax is noted.  The left lung remains
clear.
IMPRESSION: Persistent right basilar atelectasis.  No pneumothorax is noted
following chest tube removal.

## 2014-12-07 ENCOUNTER — Ambulatory Visit (INDEPENDENT_AMBULATORY_CARE_PROVIDER_SITE_OTHER): Payer: BLUE CROSS/BLUE SHIELD | Admitting: *Deleted

## 2014-12-07 DIAGNOSIS — I480 Paroxysmal atrial fibrillation: Secondary | ICD-10-CM

## 2014-12-12 NOTE — Progress Notes (Signed)
Loop recorder 

## 2014-12-14 ENCOUNTER — Encounter: Payer: Self-pay | Admitting: Internal Medicine

## 2014-12-17 ENCOUNTER — Other Ambulatory Visit: Payer: Self-pay

## 2014-12-17 MED ORDER — FLUTICASONE PROPIONATE 50 MCG/ACT NA SUSP: 2.0000 | Freq: Every day | NASAL | Status: DC

## 2014-12-20 LAB — CUP PACEART REMOTE DEVICE CHECK: Date Time Interrogation Session: 20160616114728

## 2015-01-01 ENCOUNTER — Encounter: Payer: Self-pay | Admitting: Internal Medicine

## 2015-01-08 ENCOUNTER — Ambulatory Visit (INDEPENDENT_AMBULATORY_CARE_PROVIDER_SITE_OTHER): Payer: BLUE CROSS/BLUE SHIELD | Admitting: *Deleted

## 2015-01-08 DIAGNOSIS — I480 Paroxysmal atrial fibrillation: Secondary | ICD-10-CM

## 2015-01-09 LAB — CUP PACEART REMOTE DEVICE CHECK: Date Time Interrogation Session: 20160706130508

## 2015-01-09 NOTE — Progress Notes (Signed)
[facility] summary report.  No AF detected. Continue monthly monitoring. Follow up with Dr Aundra Dubin as scheduled.  Chanetta Marshall, NP 01/09/2015 1:05 PM

## 2015-01-09 NOTE — Progress Notes (Signed)
Loop recorder 

## 2015-01-23 ENCOUNTER — Encounter: Payer: Self-pay | Admitting: Internal Medicine

## 2015-01-28 ENCOUNTER — Encounter: Payer: Self-pay | Admitting: Internal Medicine

## 2015-01-29 MED ORDER — TAMSULOSIN HCL 0.4 MG PO CAPS: 0.4000 mg | ORAL_CAPSULE | Freq: Every day | ORAL | Status: DC

## 2015-02-06 ENCOUNTER — Ambulatory Visit (INDEPENDENT_AMBULATORY_CARE_PROVIDER_SITE_OTHER): Payer: BLUE CROSS/BLUE SHIELD | Admitting: *Deleted

## 2015-02-06 DIAGNOSIS — I480 Paroxysmal atrial fibrillation: Secondary | ICD-10-CM

## 2015-02-06 NOTE — Progress Notes (Signed)
Loop recorder 

## 2015-02-14 LAB — CUP PACEART REMOTE DEVICE CHECK: Date Time Interrogation Session: 20160811105432

## 2015-02-22 ENCOUNTER — Encounter: Payer: Self-pay | Admitting: *Deleted

## 2015-02-22 ENCOUNTER — Encounter: Payer: Self-pay | Admitting: Internal Medicine

## 2015-02-22 NOTE — Progress Notes (Signed)
Brady episode from 8-14 printed for review. Episode lasted 7 sec w/ minimum V rate of 30 bpm. ECG shows undersensed PVCs. Patient to f/u w/JA prn.

## 2015-02-26 ENCOUNTER — Encounter: Payer: Self-pay | Admitting: Internal Medicine

## 2015-03-01 ENCOUNTER — Ambulatory Visit (INDEPENDENT_AMBULATORY_CARE_PROVIDER_SITE_OTHER): Payer: BLUE CROSS/BLUE SHIELD | Admitting: Family Medicine

## 2015-03-01 DIAGNOSIS — M545 Low back pain, unspecified: Secondary | ICD-10-CM

## 2015-03-01 DIAGNOSIS — T148 Other injury of unspecified body region: Secondary | ICD-10-CM | POA: Diagnosis not present

## 2015-03-01 DIAGNOSIS — T148XXA Other injury of unspecified body region, initial encounter: Secondary | ICD-10-CM

## 2015-03-01 MED ORDER — TIZANIDINE HCL 4 MG PO TABS: 4.0000 mg | ORAL_TABLET | Freq: Three times a day (TID) | ORAL | Status: DC | PRN

## 2015-03-01 MED ORDER — MELOXICAM 7.5 MG PO TABS: 7.5000 mg | ORAL_TABLET | Freq: Every day | ORAL | Status: DC | PRN

## 2015-03-01 NOTE — Progress Notes (Signed)
Chief Complaint:  Chief Complaint  Patient presents with  . Back Pain    x 1 week    HPI: Christopher [name] is a 64 y.o. 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 He has no incontinence. Most difflicut to get from sitting to standing position but when starts moving ok, he ahs about "mild pain, soreness, not even pain" but it hurts him enough to be here since advil otc not working   Past Medical History  Diagnosis Date  . Hypertension   . Rhinitis, allergic   . Severe mitral regurgitation     a. Mitral valve prolapse with flail segment of posterior leaflet and severe MR by TEE, remote h/o bacterial endocarditis   . Coronary artery disease     a. 2/7 Cath: LM nl, LAD min irregs, LCX min irregs, RI 40, RCA 40m, EF 55-60% basal to mid inf HK, 3-4+ MR;  b. 08/25/2012 PCI of RCA with 4.0x15 Vision BMS  . 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 Sleep apnea     NPSG 01/21/06- AHI 40.7/hr cpap  . GERD (gastroesophageal reflux disease)     hx  . Arthritis   . 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 Atrial fibrillation     post op, intol of anticoag  . Hyperlipidemia     on statin   Past Surgical History  Procedure Laterality Date  . Septoplasty      Dr. [name]  . Uvulopalatopharyngoplasty    . Tonsillectomy    . Foot surgery      BILATERAL TOES  . Amputation  07/28/2012    Procedure: AMPUTATION DIGIT;  Surgeon: Colin Rhein, MD;  Location: [facility];  Service: Orthopedics;  Laterality: Right;  2nd toe  . Tee without cardioversion N/A 08/12/2012    Procedure: TRANSESOPHAGEAL ECHOCARDIOGRAM (TEE);  Surgeon: [name], MD;  Location: [place];  Service:  Cardiovascular;  Laterality: N/A;  . Cardiac catheterization    . Mitral valve repair Right 09/27/2012    Procedure: MINIMALLY INVASIVE MITRAL VALVE REPAIR (MVR);  Surgeon: [name], MD;  Location: [place];  Service: Open Heart Surgery;  Laterality: Right;  Ultrasound guided  . Intraoperative transesophageal echocardiogram N/A 09/27/2012    Procedure: INTRAOPERATIVE TRANSESOPHAGEAL ECHOCARDIOGRAM;  Surgeon: [name], MD;  Location: [place];  Service: Open Heart Surgery;  Laterality: N/A;  . Implantable loop recorder placement  03/31/13    MDT Linq implanted by Dr Rayann Heman to evaluate for further afib  . Left and right heart catheterization with coronary angiogram N/A 08/12/2012    Procedure: LEFT AND RIGHT HEART CATHETERIZATION WITH CORONARY ANGIOGRAM;  Surgeon: [name], MD;  Location: [facility] CATH LAB;  Service: Cardiovascular;  Laterality: N/A;  . Percutaneous coronary stent intervention (pci-s) N/A 08/25/2012    Procedure: PERCUTANEOUS CORONARY STENT INTERVENTION (PCI-S);  Surgeon: Sherren Mocha, MD;  Location: [facility] CATH LAB;  Service: Cardiovascular;  Laterality: N/A;  . Loop recorder implant N/A 03/31/2013    Procedure: LOOP RECORDER IMPLANT;  Surgeon: [name], MD;  Location: [facility] CATH LAB;  Service: Cardiovascular;  Laterality: N/A;   Social History  Social History  . Marital Status: Divorced    Spouse Name: N/A  . Number of Children: N/A  . Years of Education: N/A   Occupational History  . Works at [employer]  . Smoking status: Former Smoker -- 2.50 packs/day for 5 years    Types: Cigarettes    Quit date: 07/06/1978  . Smokeless tobacco: None     Comment: social drinker  . Alcohol Use: Yes     Comment: OCC.  . Drug Use: No  . Sexual Activity: Yes   Other Topics Concern  . None   Social History Narrative   Divorced, lives alone   Supportive g-friend (shirlee moore)            Family History  Problem Relation Age of Onset  .  Heart disease Father   . Colon cancer Father 87  . Diabetes Father   . Diabetes Mother   . Stroke Mother   . Renal cancer Father   . Sudden death Brother   . Renal cancer Father   . Heart attack Brother    Allergies  Allergen Reactions  . Codeine     Causes bad constipation  . Dust Mite Extract Cough  . Mold Extract [Trichophyton] Cough  . Pollen Extract Cough   Prior to Admission medications   Medication Sig Start Date End Date Taking? Authorizing Provider  aspirin EC 81 MG tablet Take 81 mg by mouth daily.   Yes Historical Provider, MD  atorvastatin (LIPITOR) 20 MG tablet TAKE 1 TABLET BY MOUTH EVERY DAY 06/12/14  Yes [name], MD  Cholecalciferol (VITAMIN D) 2000 UNITS CAPS Take 2,000 Units by mouth daily.    Yes Historical Provider, MD  fluticasone (FLONASE) 50 MCG/ACT nasal spray Place 2 sprays into both nostrils daily. 12/17/14  Yes Rowe Clack, MD  metoprolol succinate (TOPROL-XL) 100 MG 24 hr tablet TAKE 1 TABLET (100 MG TOTAL) BY MOUTH DAILY. TAKE WITH OR IMMEDIATELY FOLLOWING A MEAL. 06/11/14  Yes [name], MD  ramipril (ALTACE) 10 MG capsule TAKE ONE CAPSULE BY MOUTH EVERY DAY 09/25/14  Yes [name], MD  tamsulosin (FLOMAX) 0.4 MG CAPS capsule Take 1 capsule (0.4 mg total) by mouth daily. 01/29/15  Yes Olga Millers, MD  tadalafil (CIALIS) 5 MG tablet Take 1 tablet (5 mg total) by mouth daily. Patient not taking: Reported on 03/01/2015 11/13/14   Olga Millers, MD     ROS: The patient denies fevers, chills, night sweats, unintentional weight loss, chest pain, palpitations, wheezing, dyspnea on exertion, nausea, vomiting, abdominal pain, dysuria, hematuria, melena, numbness, weakness, or tingling.   All other systems have been reviewed and were otherwise negative with the exception of those mentioned in the HPI and as above.    PHYSICAL EXAM: There were no vitals filed for this visit. There is no weight on file to calculate  BMI.   General: Alert, no acute distress HEENT:  Normocephalic, atraumatic, oropharynx patent. EOMI, PERRLA Cardiovascular:  Regular rate and rhythm, no rubs murmurs or gallops.  No Carotid bruits, radial pulse intact. No pedal edema.  Respiratory: Clear to auscultation bilaterally.  No wheezes, rales, or rhonchi.  No cyanosis, no use of accessory musculature Abdominal: No organomegaly, abdomen is soft and non-tender, positive bowel sounds. No masses. Skin: No rashes. Neurologic: Facial musculature symmetric. Psychiatric: Patient acts appropriately throughout our interaction. Lymphatic: No cervical or submandibular lymphadenopathy Musculoskeletal: Gait intact.  + paramsk tenderness  L4-S1 Full ROM 5/5 strength, 2/2 DTRs No saddle anesthesia Straight leg negative Hip and knee exam--normal      LABS: Results for orders placed or performed in visit on 02/06/15  Implantable device - remote  Result Value Ref Range   Pulse Generator Manufacturer MERM    Date Time Interrogation Session [id number]    Pulse Gen Model GXQ11 Reveal LINQ    Pulse Gen Serial Number [id number]    Implantable Pulse Generator Type ICM/ILR    Implantable Pulse Generator Implant Date [id number]    Eval Rhythm SR w/PVC      EKG/XRAY:   Primary read interpreted by Dr. Marin Comment at [facility].   ASSESSMENT/PLAN: Encounter Diagnoses  Name Primary?  . Midline low back pain without sciatica Yes  . Sprain and strain    Rx Mobic and also zanaflex, we went over cardiac risk on mobic Fu if worsening pain for xray, patient declines xray at this time Fu prn   Gross sideeffects, risk and benefits, and alternatives of medications d/w patient. Patient is aware that all medications have potential sideeffects and we are unable to predict every sideeffect or drug-drug interaction that may occur.  Thao Le DO  03/02/2015 11:26 AM

## 2015-03-02 ENCOUNTER — Encounter: Payer: Self-pay | Admitting: Family Medicine

## 2015-03-29 ENCOUNTER — Other Ambulatory Visit: Payer: Self-pay | Admitting: Cardiology

## 2015-04-08 ENCOUNTER — Ambulatory Visit (INDEPENDENT_AMBULATORY_CARE_PROVIDER_SITE_OTHER): Payer: BLUE CROSS/BLUE SHIELD | Admitting: *Deleted

## 2015-04-08 DIAGNOSIS — I480 Paroxysmal atrial fibrillation: Secondary | ICD-10-CM | POA: Diagnosis not present

## 2015-04-09 NOTE — Progress Notes (Signed)
Loop recorder 

## 2015-04-29 LAB — CUP PACEART REMOTE DEVICE CHECK: Date Time Interrogation Session: 20161002053517

## 2015-04-29 NOTE — Progress Notes (Signed)
Carelink summary report received. Battery status OK. Normal device function. No new symptom, tachy, brady, or AF episodes. 1 pause episode--undersensed PVC. Monthly summary reports and ROV with JA PRN.

## 2015-05-07 ENCOUNTER — Ambulatory Visit (INDEPENDENT_AMBULATORY_CARE_PROVIDER_SITE_OTHER): Payer: BLUE CROSS/BLUE SHIELD | Admitting: *Deleted

## 2015-05-07 DIAGNOSIS — I480 Paroxysmal atrial fibrillation: Secondary | ICD-10-CM | POA: Diagnosis not present

## 2015-05-07 NOTE — Progress Notes (Signed)
Loop recorder 

## 2015-05-16 ENCOUNTER — Encounter: Payer: Self-pay | Admitting: Internal Medicine

## 2015-05-16 ENCOUNTER — Ambulatory Visit (INDEPENDENT_AMBULATORY_CARE_PROVIDER_SITE_OTHER): Payer: BLUE CROSS/BLUE SHIELD | Admitting: Internal Medicine

## 2015-05-16 VITALS — BP 160/120 | HR 67 | Temp 98.4°F | Resp 14 | Ht 70.0 in | Wt 238.0 lb

## 2015-05-16 DIAGNOSIS — I10 Essential (primary) hypertension: Secondary | ICD-10-CM | POA: Diagnosis not present

## 2015-05-16 DIAGNOSIS — N400 Enlarged prostate without lower urinary tract symptoms: Secondary | ICD-10-CM | POA: Insufficient documentation

## 2015-05-16 NOTE — Assessment & Plan Note (Signed)
Cialis was too expensive and did not work well so we switched to flomax which is working better. He is also slightly limiting his water intake especially in the evening. Much fewer symptoms in the evening. Continue flomax.

## 2015-05-16 NOTE — Patient Instructions (Signed)
We are watching your blood pressure but think that we likely do need to increase your medicine. Work on getting some of the weight off as this will help the blood pressure go back down.   Exercising to try to lose weight is 5 times per week for 30-40 minutes each time.  Exercising to Lose Weight Exercising can help you to lose weight. In order to lose weight through exercise, you need to do vigorous-intensity exercise. You can tell that you are exercising with vigorous intensity if you are breathing very hard and fast and cannot hold a conversation while exercising. Moderate-intensity exercise helps to maintain your current weight. You can tell that you are exercising at a moderate level if you have a higher heart rate and faster breathing, but you are still able to hold a conversation. HOW OFTEN SHOULD I EXERCISE? Choose an activity that you enjoy and set realistic goals. Your health care provider can help you to make an activity plan that works for you. Exercise regularly as directed by your health care provider. This may include:  Doing resistance training twice each week, such as:  Push-ups.  Sit-ups.  Lifting weights.  Using resistance bands.  Doing a given intensity of exercise for a given amount of time. Choose from these options:  150 minutes of moderate-intensity exercise every week.  75 minutes of vigorous-intensity exercise every week.  A mix of moderate-intensity and vigorous-intensity exercise every week. Children, pregnant women, people who are out of shape, people who are overweight, and older adults may need to consult a health care provider for individual recommendations. If you have any sort of medical condition, be sure to consult your health care provider before starting a new exercise program. WHAT ARE SOME ACTIVITIES THAT CAN HELP ME TO LOSE WEIGHT?   Walking at a rate of at least 4.5 miles an hour.  Jogging or running at a rate of 5 miles per hour.  Biking at  a rate of at least 10 miles per hour.  Lap swimming.  Roller-skating or in-line skating.  Cross-country skiing.  Vigorous competitive sports, such as football, basketball, and soccer.  Jumping rope.  Aerobic dancing. HOW CAN I BE MORE ACTIVE IN MY DAY-TO-DAY ACTIVITIES?  Use the stairs instead of the elevator.  Take a walk during your lunch break.  If you drive, park your car farther away from work or school.  If you take public transportation, get off one stop early and walk the rest of the way.  Make all of your phone calls while standing up and walking around.  Get up, stretch, and walk around every 30 minutes throughout the day. WHAT GUIDELINES SHOULD I FOLLOW WHILE EXERCISING?  Do not exercise so much that you hurt yourself, feel dizzy, or get very short of breath.  Consult your health care provider prior to starting a new exercise program.  Wear comfortable clothes and shoes with good support.  Drink plenty of water while you exercise to prevent dehydration or heat stroke. Body water is lost during exercise and must be replaced.  Work out until you breathe faster and your heart beats faster.   This information is not intended to replace advice given to you by your health care provider. Make sure you discuss any questions you have with your health care provider.   Document Released: 07/25/2010 Document Revised: 07/13/2014 Document Reviewed: 11/23/2013 Elsevier Interactive Patient Education [employer].

## 2015-05-16 NOTE — Progress Notes (Signed)
Pre visit review using our clinic review tool, if applicable. No additional management support is needed unless otherwise documented below in the visit note. 

## 2015-05-16 NOTE — Progress Notes (Signed)
   Subjective:    Patient ID: Christopher [name], male    DOB: 07-23-1950, 64 y.o.   MRN: [id number]  HPI The patient is a 64 YO man coming in for follow up of his BPH and his blood pressure. His pressure was high last visit and again today. He denies feeling bad. He has gained about 10 pounds since our last visit and he is thinking that this could be causing some of the problem. He does see his cardiologist next week and wants them to change the medicine since they started it. No headaches or chest pains.  His BPH symptoms are better now that he has stopped drinking so much water. He was drinking 80oz + liquid daily. He has cut back to 60 oz and this has helped. He is also taking the flomax and not urinating at night time as much. No side effects from the flomax. No dizziness.  Review of Systems  Constitutional: Negative for diaphoresis, activity change, appetite change, fatigue and unexpected weight change.  Respiratory: Negative for cough, chest tightness, shortness of breath and wheezing.   Cardiovascular: Negative for chest pain, palpitations and leg swelling.  Gastrointestinal: Negative for nausea, abdominal pain, diarrhea, constipation and abdominal distention.  Genitourinary: Positive for enuresis. Negative for dysuria, hematuria, flank pain, discharge and difficulty urinating.       Much improved, now only 1 time per night  Musculoskeletal: Positive for arthralgias. Negative for myalgias and gait problem.  Skin: Negative.   Neurological: Negative.   Psychiatric/Behavioral: Negative.       Objective:   Physical Exam  Constitutional: He is oriented to person, place, and time. He appears well-developed and well-nourished.  Overweight  HENT:  Head: Normocephalic and atraumatic.  Eyes: EOM are normal.  Neck: Normal range of motion.  Cardiovascular: Normal rate and regular rhythm.   Pulmonary/Chest: Effort normal and breath sounds normal. No respiratory distress. He has no wheezes.    Abdominal: Soft. Bowel sounds are normal.  Musculoskeletal: He exhibits no edema.  Neurological: He is alert and oriented to person, place, and time.  Skin: Skin is warm and dry.  Psychiatric: He has a normal mood and affect.   Filed Vitals:   05/16/15 0818 05/16/15 0852  BP: 160/120 160/120  Pulse: 67   Temp: 98.4 F (36.9 C)   TempSrc: Oral   Resp: 14   Height: 5\' 10"  (1.778 m)   Weight: 238 lb (107.956 kg)   SpO2: 97%       Assessment & Plan:

## 2015-05-16 NOTE — Assessment & Plan Note (Addendum)
BP markedly elevated today and concern that his 10 pound weight gain over the last year is the cause. He is retiring Jan and will be able to work out. He does want his cardiologist to change his medications. Would recommend adding HCTZ to his ACE-I as simply increasing his ACE-I would likely not get control of his pressures. He will also work on weight loss (especially when he retires). Continue ramipril and metoprolol.

## 2015-05-21 ENCOUNTER — Ambulatory Visit (INDEPENDENT_AMBULATORY_CARE_PROVIDER_SITE_OTHER): Payer: BLUE CROSS/BLUE SHIELD | Admitting: Cardiology

## 2015-05-21 ENCOUNTER — Encounter: Payer: Self-pay | Admitting: Cardiology

## 2015-05-21 VITALS — BP 190/80 | HR 64 | Ht 70.0 in | Wt 237.8 lb

## 2015-05-21 DIAGNOSIS — I340 Nonrheumatic mitral (valve) insufficiency: Secondary | ICD-10-CM

## 2015-05-21 DIAGNOSIS — I480 Paroxysmal atrial fibrillation: Secondary | ICD-10-CM

## 2015-05-21 DIAGNOSIS — I10 Essential (primary) hypertension: Secondary | ICD-10-CM

## 2015-05-21 MED ORDER — AMLODIPINE BESYLATE 5 MG PO TABS: 5.0000 mg | ORAL_TABLET | Freq: Every day | ORAL | Status: DC

## 2015-05-21 NOTE — Patient Instructions (Signed)
Medication Instructions:  Start amlodipine 5mg  daily*  Labwork: None   Testing/Procedures: None   Follow-Up: Your physician recommends that you schedule a follow-up appointment in: 1 month with the PA/NP  Your physician wants you to follow-up in: 6 months with Dr Aundra Dubin. (May 2017). You will receive a reminder letter in the mail two months in advance. If you don't receive a letter, please call our office to schedule the follow-up appointment.        If you need a refill on your cardiac medications before your next appointment, please call your pharmacy.

## 2015-05-22 NOTE — Progress Notes (Signed)
Patient ID: Christopher [name], male   DOB: 09/07/1950, 64 y.o.   MRN: [id number] PCP: Dr. Asa Lente  64 yo with history of mitral valve prolapse and prior mitral valve endocarditis with severe mitral regurgitation presents for cardiology followup. He had a TEE in 7/13 that showed EF 55-60% with partial flail posterior leaflet and at least moderate but probably severe eccentric anteriorly directed MR.  The LV was upper normal in size.  Patient had noted the onset of exertional dyspnea.  No chest pain.  I took him for TEE, which showed flail P2 and P1 segments of the mitral valve with a ruptured chord.  There was severe MR.  LHC showed 95% mRCA stenosis.  As he wanted a minimally invasive mitral valve repair, arranged for PCI to the mid RCA, which was done using BMS.  After 1 month of Plavix, he had a minimally-invasive mitral valve repair with uncomplicated hospital course.  He went into atrial fibrillation several weeks after his surgery but converted back to NSR on amiodarone.  Patient had to stop Eliquis due to headaches.  He then started Xarelto.  He developed a spontaneous right thigh hematoma while on Xarelto and this was stopped also.  He is now on ASA 81 mg daily only. He then had a loop recorder placed to see if he was having any further episodes of atrial fibrillation.  No recurrence so far. He is working full time but plans to retire in January. He feels good.  He is not taking Lasix now.  No chest pain or significant exertional dyspnea.  No tachypalpitations.  Main issue currently is HTN.  He is not getting as much exercise at his job now, and his weight is up 6 lbs since last appointment.  BP running high.  He has been taking all his meds.    Post-op echo showed that the mitral valve looked good but EF was 35-40% with global hypokinesis.  Repeat echo in 5/15 showed EF improved to 55-60% with stable mitral valve repair.    Labs (7/12): LDL 128 Labs (7/13): K 3.9, creatinine 0.9, HDL 54, LDL 133 Labs  (4/14): K 4.2, creatinine 1.2, TSH normal, LFTs normal, BNP 132 Labs (5/14): LDL 57, HDL 33 Labs (9/14): HCT 36.9, K 3.5, creatinine 0.87 Labs (10/14): K 3.8, creatinine 1.07, LDL 57, HDL 47 Labs (5/15): K 3.6, creatinine 1.1 Labs (5/16): K 3.6, creatinine 0.89, LDL 49, HDL 38  PMH: 1. HTN 2. H/o sinusitis 3. Nephrolithiasis 4. Mitral valve disease: Baseline mitral valve prolapse.  Had MV endocarditis (Strep viridans) in 4/09.  Has had moderate to severe mitral regurgitation.  Echo (7/12): EF 60%, upper normal LV size (EDD 55 mm, ESD 35 mm), moderate (grade II) diastolic dysfunction with E/medial e' < 15, moderate to possibly severe eccentric MR with mitral valve prolapse and partial flail posterior leaflet, mild LAE. Echo (7/13): EF 55-60%, upper normal LV size, grade II diastolic dysfunction with E/e' < 15, IVC normal, flail segment of posterior leaflet with at least moderate and probably severe anteriorly directed MR, normal RV, unable to measure PA systolic pressure.  TEE (2/14) with EF 55-60%, flail P2 and P1 segments of the posterior leaflet with a ruptured chord, eccentric severe MR.  Status post minimally-invasive mitral valve repair in 3/14.  5. CAD: LHC (2/14) showed 95% mRCA stenosis.  This was treated with BMS.  6. Atrial fibrillation: First noted in 4/14. Headaches with Eliquis.  Thigh hematoma with Xarelto.  He now has a  loop recorder to assess for recurrent atrial fibrillation, none so far.  7. Cardiomyopathy: Echo (4/14) with EF 35-40%, diffuse hypokinesis, s/p mitral valve repair with no mitral regurgitation.  Echo (7/14) with EF 45-50%, septal hypokinesis, MV repair w/o MR or significant stenosis.  Echo (5/15) with EF 55-60%, s/p MV repair with trivial MR, normal RV size and systolic function.  8. Right-sided pleural effusion post-MV surgery 9. OSA: Using CPAP.   SH: Works at [employer] on [employer].  Divorced.  Occasional ETOH.  No smoking.   FH: Father with CAD, brother with  MI at 67.    ROS: All systems reviewed and negative except as per HPI.   Current Outpatient Prescriptions  Medication Sig Dispense Refill  . aspirin EC 81 MG tablet Take 81 mg by mouth daily.    Marland Kitchen atorvastatin (LIPITOR) 20 MG tablet TAKE 1 TABLET BY MOUTH EVERY DAY 90 tablet 3  . cetirizine (ZYRTEC) 10 MG tablet Take 10 mg by mouth daily.    . Cholecalciferol (VITAMIN D) 2000 UNITS CAPS Take 2,000 Units by mouth daily.     . fluticasone (FLONASE) 50 MCG/ACT nasal spray Place 2 sprays into both nostrils daily. 48 g 2  . metoprolol succinate (TOPROL-XL) 100 MG 24 hr tablet TAKE 1 TABLET (100 MG TOTAL) BY MOUTH DAILY. TAKE WITH OR IMMEDIATELY FOLLOWING A MEAL. 90 tablet 3  . ramipril (ALTACE) 10 MG capsule TAKE ONE CAPSULE BY MOUTH EVERY DAY 90 capsule 0  . tamsulosin (FLOMAX) 0.4 MG CAPS capsule Take 1 capsule (0.4 mg total) by mouth daily. 30 capsule 3  . amLODipine (NORVASC) 5 MG tablet Take 1 tablet (5 mg total) by mouth daily. 90 tablet 3   No current facility-administered medications for this visit.    BP 190/80 mmHg  Pulse 64  Ht 5\' 10"  (1.778 m)  Wt 237 lb 12.8 oz (107.865 kg)  BMI 34.12 kg/m2 General: NAD Neck: No JVD, no thyromegaly or thyroid nodule.  Lungs: Slight decreased breath sounds right base. CV: Nondisplaced PMI.  Heart regular S1/S2, no S3/S4, no significant murmur.  Trace ankle edema.  No carotid bruit.  Normal pedal pulses.  Abdomen: Soft, nontender, no hepatosplenomegaly, no distention.  Neurologic: Alert and oriented x 3.  Psych: Normal affect. Extremities: No clubbing or cyanosis.   Assessment/Plan:  Mitral valve regurgitation  Severe eccentric MR in the setting of MV prolapse and history of MV endocarditis, now s/p minimally invasive MV repair.  The valve looked good on 5/15 echo.   Atrial fibrillation He did not tolerate Eliquis due to headaches and developed a thigh hematoma spontaneously on Xarelto.  He is now off anticoagulation.  He had post-op  atrial fibrillation after MVR, but the atrial fibrillation occurred relatively far out from the procedure.  He continues to be in NSR.  No symptoms concerning for atrial fibrillation at this time.  He has a loop recorder that has shown no recurrences of atrial fibrillation so far. If he has a recurrence will need to revisit starting an anticoagulant.  If not, given spontaneous thigh hematoma, would hold off.   CAD Status post PCI to [facility] with BMS.  Stable, no significant ischemic chest pain.  Continue ASA, statin.  Hyperlipidemia Statin begun with CAD.  Good lipids in 5/16.   Cardiomyopathy EF back to normal on most recent echo. - Continue ramipril and Toprol XL at current doses.  HTN BP running high, likely with weight gain.  He is compliant with CPAP.  I  will have him start amlodipine 5 mg daily.  He will followup in 1 month with a PA to make sure that BP is coming down.   Loralie Champagne 05/22/2015

## 2015-05-23 ENCOUNTER — Other Ambulatory Visit: Payer: Self-pay | Admitting: Internal Medicine

## 2015-05-25 ENCOUNTER — Other Ambulatory Visit: Payer: Self-pay | Admitting: Internal Medicine

## 2015-05-27 ENCOUNTER — Other Ambulatory Visit: Payer: Self-pay | Admitting: Internal Medicine

## 2015-06-01 ENCOUNTER — Other Ambulatory Visit: Payer: Self-pay | Admitting: Cardiology

## 2015-06-01 LAB — CUP PACEART REMOTE DEVICE CHECK: Date Time Interrogation Session: 20161101060533

## 2015-06-01 NOTE — Progress Notes (Signed)
Carelink summary report received. Battery status OK. Normal device function. No new symptom, tachy, pause, or AF episodes. 19 brady episodes--all ECGs show undersensed bigeminal PVCs. Monthly summary reports and ROV with JA PRN.

## 2015-06-06 ENCOUNTER — Ambulatory Visit (INDEPENDENT_AMBULATORY_CARE_PROVIDER_SITE_OTHER): Payer: BLUE CROSS/BLUE SHIELD | Admitting: *Deleted

## 2015-06-06 DIAGNOSIS — I480 Paroxysmal atrial fibrillation: Secondary | ICD-10-CM | POA: Diagnosis not present

## 2015-06-06 NOTE — Progress Notes (Signed)
Carelink Summary Report / Loop Recorder 

## 2015-06-23 NOTE — Progress Notes (Signed)
Cardiology Office Note    Date:  06/24/2015   ID:  Christopher [name], DOB December 05, 1950, MRN [id number]  PCP:  No primary care provider on file.  Cardiologist:  Dr. Loralie Champagne   Electrophysiologist:  Dr. Thompson Grayer    Chief Complaint  Patient presents with  . Follow-up    HTN    History of Present Illness:  Christopher [name] is a 64 y.o. male with a history of mitral valve prolapse and prior mitral valve endocarditis with severe MR, CAD. He underwent BMS to the mid RCA in 2/14. He then underwent minimally invasive mitral valve repair 3/14. He had postoperative atrial fibrillation that was treated with Amiodarone.  He had to stop Eliquis secondary to headaches. He developed a spontaneous right thigh hematoma on Xarelto. He is currently only on aspirin 81 mg daily. Loop recorder was implanted and thus far has not demonstrated any further atrial fibrillation. Postop echo did show an EF of 35-40%. Most recent echo in 5/15 demonstrated EF 55-60%.    He was last seen by Dr. Dr. Aundra Dubin 11/16. Blood pressure was markedly elevated. Amlodipine 5 mg daily was added to his medical regimen. He was asked to follow-up with me today to recheck his blood pressure. He is doing well without chest pain, significant dyspnea, orthopnea, PND or edema. Denies syncope.    Past Medical History  Diagnosis Date  . Hypertension   . Rhinitis, allergic   . Severe mitral regurgitation     a. Mitral valve prolapse with flail segment of posterior leaflet and severe MR by TEE, remote h/o bacterial endocarditis   . Coronary artery disease     a. 2/7 Cath: LM nl, LAD min irregs, LCX min irregs, RI 40, RCA 63m, EF 55-60% basal to mid inf HK, 3-4+ MR;  b. 08/25/2012 PCI of RCA with 4.0x15 Vision BMS  . Subacute bacterial endocarditis ([place]) 03/22/2008    Strep viridans  . Sleep apnea     NPSG 01/21/06- AHI 40.7/hr cpap  . GERD (gastroesophageal reflux disease)     hx  . Arthritis   . 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 Atrial fibrillation ([place])     post op, intol of anticoag  . Hyperlipidemia     on statin  1. HTN 2. H/o sinusitis 3. Nephrolithiasis 4. Mitral valve disease: Baseline mitral valve prolapse. Had MV endocarditis (Strep viridans) in 4/09. Has had moderate to severe mitral regurgitation. Echo (7/12): EF 60%, upper normal LV size (EDD 55 mm, ESD 35 mm), moderate (grade II) diastolic dysfunction with E/medial e' < 15, moderate to possibly severe eccentric MR with mitral valve prolapse and partial flail posterior leaflet, mild LAE. Echo (7/13): EF 55-60%, upper normal LV size, grade II diastolic dysfunction with E/e' < 15, IVC normal, flail segment of posterior leaflet with at least moderate and probably severe anteriorly directed MR, normal RV, unable to measure PA systolic pressure. TEE (2/14) with EF 55-60%, flail P2 and P1 segments of the posterior leaflet with a ruptured chord, eccentric severe MR. Status post minimally-invasive mitral valve repair in 3/14.  5. CAD: LHC (2/14) showed 95% mRCA stenosis. This was treated with BMS.  6. Atrial fibrillation: First noted in 4/14. Headaches with Eliquis. Thigh hematoma with Xarelto. He now has a loop recorder to assess for recurrent atrial fibrillation, none so far.  7. Cardiomyopathy: Echo (4/14) with EF 35-40%, diffuse hypokinesis, s/p mitral valve repair with no mitral regurgitation. Echo (7/14) with EF 45-50%, septal hypokinesis, MV repair w/o MR or significant stenosis. Echo (5/15) with EF 55-60%, s/p MV repair with trivial MR, normal RV size and systolic function.  8. Right-sided pleural effusion post-MV surgery 9. OSA: Using CPAP.    Past Surgical History  Procedure Laterality Date  . Septoplasty      Dr. [name]  . Uvulopalatopharyngoplasty    . Tonsillectomy    . Foot  surgery      BILATERAL TOES  . Amputation  07/28/2012    Procedure: AMPUTATION DIGIT;  Surgeon: Colin Rhein, MD;  Location: [facility];  Service: Orthopedics;  Laterality: Right;  2nd toe  . Tee without cardioversion N/A 08/12/2012    Procedure: TRANSESOPHAGEAL ECHOCARDIOGRAM (TEE);  Surgeon: [name], MD;  Location: [place];  Service: Cardiovascular;  Laterality: N/A;  . Cardiac catheterization    . Mitral valve repair Right 09/27/2012    Procedure: MINIMALLY INVASIVE MITRAL VALVE REPAIR (MVR);  Surgeon: [name], MD;  Location: [place];  Service: Open Heart Surgery;  Laterality: Right;  Ultrasound guided  . Intraoperative transesophageal echocardiogram N/A 09/27/2012    Procedure: INTRAOPERATIVE TRANSESOPHAGEAL ECHOCARDIOGRAM;  Surgeon: [name], MD;  Location: [place];  Service: Open Heart Surgery;  Laterality: N/A;  . Implantable loop recorder placement  03/31/13    MDT Linq implanted by Dr Rayann Heman to evaluate for further afib  . Left and right heart catheterization with coronary angiogram N/A 08/12/2012    Procedure: LEFT AND RIGHT HEART CATHETERIZATION WITH CORONARY ANGIOGRAM;  Surgeon: [name], MD;  Location: [facility] CATH LAB;  Service: Cardiovascular;  Laterality: N/A;  . Percutaneous coronary stent intervention (pci-s) N/A 08/25/2012    Procedure: PERCUTANEOUS CORONARY STENT INTERVENTION (PCI-S);  Surgeon: Sherren Mocha, MD;  [facility]: [facility] CATH LAB;  Service: Cardiovascular;  Laterality: N/A;  . Loop recorder implant N/A 03/31/2013    Procedure: LOOP RECORDER IMPLANT;  Surgeon: [name], MD;  Location: [place] CATH LAB;  Service: Cardiovascular;  Laterality: N/A;    Current Outpatient Prescriptions  Medication Sig Dispense Refill  . amLODipine (NORVASC) 5 MG tablet Take 1 tablet (5 mg total) by mouth daily. 90 tablet 3  . aspirin EC 81 MG tablet Take 81 mg by mouth daily.    Marland Kitchen atorvastatin (LIPITOR) 20 MG tablet TAKE 1 TABLET BY MOUTH EVERY DAY 90 tablet 1  .  cetirizine (ZYRTEC) 10 MG tablet Take 10 mg by mouth daily.    . Cholecalciferol (VITAMIN D) 2000 UNITS CAPS Take 2,000 Units by mouth daily.     . fluticasone (FLONASE) 50 MCG/ACT nasal spray Place 2 sprays into both nostrils daily. 48 g 2  . metoprolol succinate (TOPROL-XL) 100 MG 24 hr tablet TAKE 1 TABLET (100 MG TOTAL) BY MOUTH DAILY. TAKE WITH OR IMMEDIATELY FOLLOWING A MEAL. 90 tablet 1  . ramipril (ALTACE) 10 MG capsule TAKE ONE CAPSULE BY MOUTH EVERY DAY 90 capsule 0  . tamsulosin (FLOMAX) 0.4 MG CAPS capsule TAKE 1 CAPSULE (0.4 MG TOTAL) BY MOUTH DAILY. 30 capsule 3   No current facility-administered medications for this visit.    Allergies:   Codeine; Dust mite extract; Mold extract; and Pollen extract   Social History   Social History  . Marital Status: Divorced    Spouse Name: N/A  . Number of Children: N/A  . Years of Education: N/A  Occupational History  . Works at [employer]  . Smoking status: Former Smoker -- 2.50 packs/day for 5 years    Types: Cigarettes    Quit date: 07/06/1978  . Smokeless tobacco: None     Comment: social drinker  . Alcohol Use: Yes     Comment: OCC.  . Drug Use: No  . Sexual Activity: Yes   Other Topics Concern  . None   Social History Narrative   Works at [employer] - plans to retire 07/2015 after 36 years.   Divorced, lives alone   Supportive g-friend (shirlee moore)              Family History:  The patient's family history includes Colon cancer (age of onset: 6) in his father; Diabetes in his father and mother; Heart attack in his brother; Heart disease in his father; Renal cancer in his father and father; Stroke in his mother; Sudden death in his brother.   ROS:   Please see the history of present illness.    ROS All other systems reviewed and are negative.   PHYSICAL EXAM:   VS:  BP 138/70 mmHg  Pulse 69  Ht 5\' 10"  (1.778 m)  Wt 245 lb 12.8 oz (111.494 kg)  BMI 35.27 kg/m2   GEN: Well  nourished, well developed, in no acute distress HEENT: normal Neck: no JVD  Cardiac: S1/S2; RRR; no murmurs, rubs, or gallops,no edema  Respiratory:  clear to auscultation bilaterally, normal work of breathing GI: soft, nontender  MS: no deformity or atrophy Skin: warm and dry, no rash Neuro:  Alert and Oriented x 3  Psych: euthymic mood    Wt Readings from Last 3 Encounters:  06/24/15 245 lb 12.8 oz (111.494 kg)  05/21/15 237 lb 12.8 oz (107.865 kg)  05/16/15 238 lb (107.956 kg)      Studies/Labs Reviewed:   EKG:  EKG is  ordered today.  The ekg ordered today demonstrates NSR, HR 69, LAD, RBBB, PVCs, QTc 503 ms  Recent Labs: 11/13/2014: ALT 22; BUN 11; Creatinine, Ser 0.89; Hemoglobin 15.4; Platelets 155.0; Potassium 3.6; Sodium 140   Lipid Panel    Component Value Date/Time   CHOL 112 11/13/2014 0837   TRIG 125.0 11/13/2014 0837   HDL 37.90* 11/13/2014 0837   CHOLHDL 3 11/13/2014 0837   VLDL 25.0 11/13/2014 0837   LDLCALC 49 11/13/2014 0837   LDLDIRECT 72.7 05/09/2014 1609    Additional studies/ records that were reviewed today include:  Echo 11/09/13 Severe LVH, EF 0000000, grade 1 diastolic dysfunction, trivial AI, mitral valve repair okay, moderate LAE  LHC 2/14 LM okay LAD: Luminal irregularities.  LCx:  large ramus with 40% proximal RCA: 95% mid RCA stenosis. EF  55-60%. There is basal to mid inferior hypokinesis. 3-4+ mitral regurgitation noted.  PCI: BMS to the RCA   Myoview 10/2007 Apical ischemia; EF 61%     ASSESSMENT:    1. Essential hypertension     PLAN:  In order of problems listed above:  1. Blood pressure is improved. Repeat blood pressure by me 132/70. Continue current regimen which includes amlodipine, metoprolol succinate and Ramipril.  He plans to retire in January. He believes that he will have more time to exercise and watch his diet. His blood pressure should improve more. Follow-up with Dr. Aundra Dubin as planned.    Medication  Adjustments/Labs and Tests Ordered: Current medicines are reviewed at length with the patient today.  Concerns regarding  medicines are outlined above.  Medication changes, Labs and Tests ordered today are listed below. Patient Instructions  Medication Instructions:  No changes.  Continue your current medications.  Labwork: None today   Testing/Procedures: None   Follow-Up: Dr. Loralie Champagne in 11/2015 as planned.   Any Other Special Instructions Will Be Listed Below (If Applicable).     Signed, Richardson Dopp, PA-C  06/24/2015 4:58 PM    [facility] [place], [place], [place]  [address] Phone: [phone]; Fax: [phone]

## 2015-06-24 ENCOUNTER — Other Ambulatory Visit: Payer: Self-pay | Admitting: Cardiology

## 2015-06-24 ENCOUNTER — Encounter: Payer: Self-pay | Admitting: Physician Assistant

## 2015-06-24 ENCOUNTER — Ambulatory Visit (INDEPENDENT_AMBULATORY_CARE_PROVIDER_SITE_OTHER): Payer: BLUE CROSS/BLUE SHIELD | Admitting: Physician Assistant

## 2015-06-24 VITALS — BP 138/70 | HR 69 | Ht 70.0 in | Wt 245.8 lb

## 2015-06-24 DIAGNOSIS — I10 Essential (primary) hypertension: Secondary | ICD-10-CM

## 2015-06-24 NOTE — Patient Instructions (Addendum)
Medication Instructions:  No changes.  Continue your current medications.  Labwork: None today   Testing/Procedures: None   Follow-Up: Dr. Loralie Champagne in 11/2015 as planned.   Any Other Special Instructions Will Be Listed Below (If Applicable).

## 2015-07-04 ENCOUNTER — Encounter: Payer: Self-pay | Admitting: Cardiology

## 2015-07-09 ENCOUNTER — Encounter: Payer: Self-pay | Admitting: Physician Assistant

## 2015-07-09 ENCOUNTER — Ambulatory Visit (INDEPENDENT_AMBULATORY_CARE_PROVIDER_SITE_OTHER): Payer: BLUE CROSS/BLUE SHIELD | Admitting: *Deleted

## 2015-07-09 DIAGNOSIS — I480 Paroxysmal atrial fibrillation: Secondary | ICD-10-CM | POA: Diagnosis not present

## 2015-07-09 NOTE — Progress Notes (Signed)
Carelink Summary Report / Loop Recorder 

## 2015-07-11 ENCOUNTER — Encounter: Payer: Self-pay | Admitting: Cardiology

## 2015-07-17 ENCOUNTER — Encounter: Payer: Self-pay | Admitting: Cardiology

## 2015-08-02 ENCOUNTER — Encounter: Payer: Self-pay | Admitting: Cardiology

## 2015-08-05 ENCOUNTER — Ambulatory Visit (INDEPENDENT_AMBULATORY_CARE_PROVIDER_SITE_OTHER): Payer: BLUE CROSS/BLUE SHIELD | Admitting: *Deleted

## 2015-08-05 DIAGNOSIS — I480 Paroxysmal atrial fibrillation: Secondary | ICD-10-CM

## 2015-08-05 NOTE — Progress Notes (Signed)
Carelink Summary Report / Loop Recorder 

## 2015-08-06 LAB — CUP PACEART REMOTE DEVICE CHECK: Date Time Interrogation Session: 20161201060551

## 2015-08-07 ENCOUNTER — Encounter: Payer: Self-pay | Admitting: Cardiology

## 2015-08-08 ENCOUNTER — Other Ambulatory Visit (HOSPITAL_COMMUNITY): Payer: Self-pay | Admitting: *Deleted

## 2015-08-08 MED ORDER — ATORVASTATIN CALCIUM 20 MG PO TABS: 20.0000 mg | ORAL_TABLET | Freq: Every day | ORAL | Status: DC

## 2015-08-09 ENCOUNTER — Encounter: Payer: Self-pay | Admitting: Physician Assistant

## 2015-08-09 NOTE — Telephone Encounter (Signed)
Informed patient that the monitor for his ILR is disconnected. I asked him to send a manual transmission.  Patient voiced understanding.

## 2015-08-12 ENCOUNTER — Other Ambulatory Visit (HOSPITAL_COMMUNITY): Payer: Self-pay | Admitting: *Deleted

## 2015-08-12 DIAGNOSIS — I480 Paroxysmal atrial fibrillation: Secondary | ICD-10-CM

## 2015-08-12 MED ORDER — AMLODIPINE BESYLATE 5 MG PO TABS: 5.0000 mg | ORAL_TABLET | Freq: Every day | ORAL | Status: DC

## 2015-08-12 MED ORDER — ATORVASTATIN CALCIUM 20 MG PO TABS: 20.0000 mg | ORAL_TABLET | Freq: Every day | ORAL | Status: DC

## 2015-08-14 ENCOUNTER — Encounter: Payer: Self-pay | Admitting: Internal Medicine

## 2015-08-14 ENCOUNTER — Encounter: Payer: Self-pay | Admitting: Cardiology

## 2015-08-14 LAB — CUP PACEART REMOTE DEVICE CHECK: Date Time Interrogation Session: 20161231060532

## 2015-08-15 ENCOUNTER — Other Ambulatory Visit: Payer: Self-pay

## 2015-08-15 MED ORDER — RAMIPRIL 10 MG PO CAPS: 10.0000 mg | ORAL_CAPSULE | Freq: Every day | ORAL | Status: DC

## 2015-08-15 MED ORDER — METOPROLOL SUCCINATE ER 100 MG PO TB24: ORAL_TABLET | ORAL | Status: DC

## 2015-08-16 ENCOUNTER — Other Ambulatory Visit: Payer: Self-pay | Admitting: Geriatric Medicine

## 2015-08-16 MED ORDER — TAMSULOSIN HCL 0.4 MG PO CAPS: ORAL_CAPSULE | ORAL | Status: DC

## 2015-08-16 MED ORDER — FLUTICASONE PROPIONATE 50 MCG/ACT NA SUSP: 2.0000 | Freq: Every day | NASAL | Status: DC

## 2015-08-29 ENCOUNTER — Ambulatory Visit (INDEPENDENT_AMBULATORY_CARE_PROVIDER_SITE_OTHER): Payer: Medicare Other | Admitting: Internal Medicine

## 2015-08-29 ENCOUNTER — Encounter: Payer: Self-pay | Admitting: Internal Medicine

## 2015-08-29 VITALS — BP 124/70 | HR 59 | Temp 98.7°F | Resp 20 | Wt 239.0 lb

## 2015-08-29 DIAGNOSIS — R05 Cough: Secondary | ICD-10-CM

## 2015-08-29 DIAGNOSIS — I10 Essential (primary) hypertension: Secondary | ICD-10-CM

## 2015-08-29 DIAGNOSIS — R059 Cough, unspecified: Secondary | ICD-10-CM

## 2015-08-29 DIAGNOSIS — I5022 Chronic systolic (congestive) heart failure: Secondary | ICD-10-CM

## 2015-08-29 DIAGNOSIS — R051 Acute cough: Secondary | ICD-10-CM | POA: Insufficient documentation

## 2015-08-29 MED ORDER — PREDNISONE 10 MG PO TABS: ORAL_TABLET | ORAL | Status: DC

## 2015-08-29 MED ORDER — HYDROCODONE-HOMATROPINE 5-1.5 MG/5ML PO SYRP: 5.0000 mL | ORAL_SOLUTION | Freq: Four times a day (QID) | ORAL | Status: DC | PRN

## 2015-08-29 MED ORDER — LEVOFLOXACIN 500 MG PO TABS: 500.0000 mg | ORAL_TABLET | Freq: Every day | ORAL | Status: DC

## 2015-08-29 NOTE — Progress Notes (Signed)
Pre visit review using our clinic review tool, if applicable. No additional management support is needed unless otherwise documented below in the visit note. 

## 2015-08-29 NOTE — Patient Instructions (Addendum)
You had the antibiotic shot today (rocephin)  Please take all new medication as prescribed - the antibiotic, cough medicine, and prednisone  Please continue all other medications as before, and refills have been done if requested.  Please have the pharmacy call with any other refills you may need.  Please keep your appointments with your specialists as you may have planned  Please go to the XRAY Department in the Basement (go straight as you get off the elevator) for the x-ray testing tomorrow  You will be contacted by phone if any changes need to be made immediately.  Otherwise, you will receive a letter about your results with an explanation, but please check with MyChart first.  Please remember to sign up for MyChart if you have not done so, as this will be important to you in the future with finding out test results, communicating by private email, and scheduling acute appointments online when needed.

## 2015-08-29 NOTE — Progress Notes (Signed)
Subjective:    Patient ID: Christopher [name], male    DOB: [birth year], 65 y.o.   MRN: [id number]  HPI  Here with acute onset mild to mod 2-3 days ST, HA, general weakness and malaise, with prod cough greenish sputum, but Pt denies chest pain, increased sob or doe, wheezing, orthopnea, PND, increased LE swelling, palpitations, dizziness or syncope. Pt denies new neurological symptoms such as new headache, or facial or extremity weakness or numbness   Pt denies polydipsia, polyuria Past Medical History  Diagnosis Date  . Hypertension   . Rhinitis, allergic   . Severe mitral regurgitation     a. Mitral valve prolapse with flail segment of posterior leaflet and severe MR by TEE, remote h/o bacterial endocarditis   . Coronary artery disease     a. 2/7 Cath: LM nl, LAD min irregs, LCX min irregs, RI 40, RCA 33m, EF 55-60% basal to mid inf HK, 3-4+ MR;  b. 08/25/2012 PCI of RCA with 4.0x15 Vision BMS  . Subacute bacterial endocarditis ([place]) 03/22/2008    Strep viridans  . Sleep apnea     NPSG 01/21/06- AHI 40.7/hr cpap  . GERD (gastroesophageal reflux disease)     hx  . Arthritis   . 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 Atrial fibrillation ([place])     post op, intol of anticoag  . Hyperlipidemia     on statin   Past Surgical History  Procedure Laterality Date  . Septoplasty      Dr. [name]  . Uvulopalatopharyngoplasty    . Tonsillectomy    . Foot surgery      BILATERAL TOES  . Amputation  07/28/2012    Procedure: AMPUTATION DIGIT;  Surgeon: Colin Rhein, MD;  Location: [facility];  Service: Orthopedics;  Laterality: Right;  2nd toe  . Tee without cardioversion N/A 08/12/2012    Procedure: TRANSESOPHAGEAL ECHOCARDIOGRAM (TEE);  Surgeon: [name], MD;  Location: [place];  Service: Cardiovascular;  Laterality: N/A;  . Cardiac  catheterization    . Mitral valve repair Right 09/27/2012    Procedure: MINIMALLY INVASIVE MITRAL VALVE REPAIR (MVR);  Surgeon: [name], MD;  Location: [place];  Service: Open Heart Surgery;  Laterality: Right;  Ultrasound guided  . Intraoperative transesophageal echocardiogram N/A 09/27/2012    Procedure: INTRAOPERATIVE TRANSESOPHAGEAL ECHOCARDIOGRAM;  Surgeon: [name], MD;  Location: [place];  Service: Open Heart Surgery;  Laterality: N/A;  . Implantable loop recorder placement  03/31/13    MDT Linq implanted by Dr Rayann Heman to evaluate for further afib  . Left and right heart catheterization with coronary angiogram N/A 08/12/2012    Procedure: LEFT AND RIGHT HEART CATHETERIZATION WITH CORONARY ANGIOGRAM;  Surgeon: [name], MD;  [facility] C[facility] LAB;  Service: Cardiovascular;  Laterality: N/A;  . Percutaneous coronary stent intervention (pci-s) N/A 08/25/2012    Procedure: PERCUTANEOUS CORONARY STENT INTERVENTION (PCI-S);  Surgeon: Sherren Mocha, MD;  Location: [facility] CATH LAB;  Service: Cardiovascular;  Laterality: N/A;  . Loop recorder implant N/A 03/31/2013    Procedure: LOOP RECORDER IMPLANT;  Surgeon: [name], MD;  Location: [place] CATH LAB;  Service: Cardiovascular;  Laterality: N/A;    reports that he quit smoking about 37 years ago. His smoking use included Cigarettes. He has a 12.5 pack-year smoking history. He does not  have any smokeless tobacco history on file. He reports that he drinks alcohol. He reports that he does not use illicit drugs. family history includes Colon cancer (age of onset: 61) in his father; Diabetes in his father and mother; Heart attack in his brother; Heart disease in his father; Renal cancer in his father and father; Stroke in his mother; Sudden death in his brother. Allergies  Allergen Reactions  . Codeine     Causes bad constipation  . Dust Mite Extract Cough  . Mold Extract [Trichophyton] Cough  . Pollen Extract Cough    Review of Systems   Constitutional: Negative for unusual diaphoresis or night sweats HENT: Negative for ringing in ear or discharge Eyes: Negative for double vision or worsening visual disturbance.  Respiratory: Negative for choking and stridor.   Gastrointestinal: Negative for vomiting or other signifcant bowel change Genitourinary: Negative for hematuria or change in urine volume.  Musculoskeletal: Negative for other MSK pain or swelling Skin: Negative for color change and worsening wound.  Neurological: Negative for tremors and numbness other than noted  Psychiatric/Behavioral: Negative for decreased concentration or agitation other than above       Objective:   Physical Exam BP 124/70 mmHg  Pulse 59  Temp(Src) 98.7 F (37.1 C) (Oral)  Resp 20  Wt 239 lb (108.41 kg)  SpO2 94% VS noted, mild ill Constitutional: Pt appears in no significant distress HENT: Head: NCAT.  Right Ear: External ear normal.  Left Ear: External ear normal.  Eyes: . Pupils are equal, round, and reactive to light. Conjunctivae and EOM are normal Neck: Normal range of motion. Neck supple.  Cardiovascular: Normal rate and regular rhythm.   Pulmonary/Chest: Effort normal and breath sounds with few LLL  rales with rhonchi, no wheezing.  Neurological: Pt is alert. Not confused , motor grossly intact Skin: Skin is warm. No rash, no LE edema Psychiatric: Pt behavior is normal. No agitation.     Assessment & Plan:

## 2015-08-30 ENCOUNTER — Encounter: Payer: Self-pay | Admitting: Internal Medicine

## 2015-08-30 ENCOUNTER — Telehealth: Payer: Self-pay

## 2015-08-30 ENCOUNTER — Encounter: Payer: Self-pay | Admitting: Cardiology

## 2015-08-30 ENCOUNTER — Ambulatory Visit (INDEPENDENT_AMBULATORY_CARE_PROVIDER_SITE_OTHER)
Admission: RE | Admit: 2015-08-30 | Discharge: 2015-08-30 | Disposition: A | Discharge status: Home / Self Care | Payer: Medicare Other | Source: Ambulatory Visit | Attending: Internal Medicine | Admitting: Internal Medicine

## 2015-08-30 DIAGNOSIS — R05 Cough: Secondary | ICD-10-CM | POA: Diagnosis not present

## 2015-08-30 DIAGNOSIS — R059 Cough, unspecified: Secondary | ICD-10-CM

## 2015-08-30 DIAGNOSIS — R509 Fever, unspecified: Secondary | ICD-10-CM | POA: Diagnosis not present

## 2015-08-30 MED ORDER — PREDNISONE 10 MG PO TABS: ORAL_TABLET | ORAL | Status: DC

## 2015-08-30 MED ORDER — LEVOFLOXACIN 500 MG PO TABS: 500.0000 mg | ORAL_TABLET | Freq: Every day | ORAL | Status: DC

## 2015-08-30 NOTE — Assessment & Plan Note (Signed)
stable overall by history and exam, and pt to continue medical treatment as before,  to f/u any worsening symptoms or concerns 

## 2015-08-30 NOTE — Assessment & Plan Note (Signed)
Mild to mod, c/w bronchitis vs pna, for antibx course, cough med prn, for cxr,  to f/u any worsening symptoms or concerns °

## 2015-08-30 NOTE — Assessment & Plan Note (Signed)
stable overall by history and exam, recent data reviewed with pt, and pt to continue medical treatment as before,  to f/u any worsening symptoms or concerns BP Readings from Last 3 Encounters:  08/29/15 124/70  06/24/15 138/70  05/21/15 190/80

## 2015-08-30 NOTE — Telephone Encounter (Signed)
Medication sent to new pharmacy

## 2015-09-04 ENCOUNTER — Ambulatory Visit (INDEPENDENT_AMBULATORY_CARE_PROVIDER_SITE_OTHER): Payer: Medicare Other | Admitting: *Deleted

## 2015-09-04 DIAGNOSIS — I480 Paroxysmal atrial fibrillation: Secondary | ICD-10-CM | POA: Diagnosis not present

## 2015-09-04 NOTE — Progress Notes (Signed)
Carelink Summary Report / Loop Recorder 

## 2015-09-14 LAB — CUP PACEART REMOTE DEVICE CHECK: Date Time Interrogation Session: 20170301063702

## 2015-09-14 NOTE — Progress Notes (Signed)
[facility] summary report received. Battery status OK. Normal device function. No new symptom episodes, tachy episodes, pause episodes. No new AF episodes. 116 brady episodes, PVC undersensing. Monthly summary reports and ROV/PRN

## 2015-10-04 ENCOUNTER — Telehealth: Payer: Self-pay | Admitting: Cardiology

## 2015-10-04 ENCOUNTER — Ambulatory Visit (INDEPENDENT_AMBULATORY_CARE_PROVIDER_SITE_OTHER): Payer: Medicare Other | Admitting: *Deleted

## 2015-10-04 DIAGNOSIS — I480 Paroxysmal atrial fibrillation: Secondary | ICD-10-CM | POA: Diagnosis not present

## 2015-10-04 NOTE — Telephone Encounter (Signed)
Spoke w/ pt and requested that he send a manual transmission b/c his home monitor has not updated in at least 14 days. Pt verbalized understanding.

## 2015-10-04 NOTE — Progress Notes (Signed)
Carelink Summary Report / Loop Recorder 

## 2015-10-15 LAB — CUP PACEART REMOTE DEVICE CHECK: Date Time Interrogation Session: 20170130063644

## 2015-10-21 ENCOUNTER — Telehealth: Payer: Self-pay | Admitting: *Deleted

## 2015-10-21 NOTE — Telephone Encounter (Signed)
Called patient to schedule an appointment for [facility] reprogramming (to turn pause and brady detection off).  Patient states he is not interested in coming in to the office as he is nearing the end of the 3-year battery life.  Patient aware to call with questions or concerns.

## 2015-10-25 ENCOUNTER — Telehealth: Payer: Self-pay | Admitting: Cardiology

## 2015-10-25 NOTE — Telephone Encounter (Signed)
LMOVM requesting that pt send manual transmission b/c home monitor has not updated in at least 14 days.    

## 2015-11-04 ENCOUNTER — Ambulatory Visit (INDEPENDENT_AMBULATORY_CARE_PROVIDER_SITE_OTHER): Payer: Medicare Other | Admitting: *Deleted

## 2015-11-04 DIAGNOSIS — I480 Paroxysmal atrial fibrillation: Secondary | ICD-10-CM

## 2015-11-04 NOTE — Progress Notes (Signed)
Carelink Summary Report / Loop Recorder 

## 2015-11-08 ENCOUNTER — Telehealth: Payer: Self-pay | Admitting: Cardiology

## 2015-11-08 NOTE — Telephone Encounter (Signed)
Spoke w/ pt and requested that he send a manual transmission b/c his home monitor has not updated in at least 14 days.   

## 2015-11-12 ENCOUNTER — Other Ambulatory Visit (HOSPITAL_COMMUNITY): Payer: Self-pay | Admitting: Cardiology

## 2015-11-30 LAB — CUP PACEART REMOTE DEVICE CHECK: Date Time Interrogation Session: 20170331063829

## 2015-11-30 NOTE — Progress Notes (Signed)
Carelink summary report received. Battery status OK. Normal device function. No new symptom episodes, tachy episodes. No new AF episodes. Pause and brady episodes are PVC undersensing. Monthly summary reports and ROV/PRN

## 2015-12-03 ENCOUNTER — Ambulatory Visit (INDEPENDENT_AMBULATORY_CARE_PROVIDER_SITE_OTHER): Payer: Medicare Other | Admitting: *Deleted

## 2015-12-03 DIAGNOSIS — I480 Paroxysmal atrial fibrillation: Secondary | ICD-10-CM

## 2015-12-04 NOTE — Progress Notes (Signed)
Carelink Summary Report / Loop Recorder 

## 2015-12-14 LAB — CUP PACEART REMOTE DEVICE CHECK: Date Time Interrogation Session: 20170430070717

## 2015-12-14 NOTE — Progress Notes (Signed)
Carelink summary report received. Battery status OK. Normal device function. No new symptom episodes, tachy episodes, or pause episodes. No new AF episodes. 70 brady- available ECGs appear SR with undersensed PVCs as previously noted. Bigeminal PVCs at times, noise artifact noted on 1 ECG. Monthly summary reports and ROV/PRN. JA PRN

## 2016-01-02 ENCOUNTER — Ambulatory Visit (INDEPENDENT_AMBULATORY_CARE_PROVIDER_SITE_OTHER): Payer: Medicare Other | Admitting: *Deleted

## 2016-01-02 DIAGNOSIS — I480 Paroxysmal atrial fibrillation: Secondary | ICD-10-CM | POA: Diagnosis not present

## 2016-01-02 NOTE — Progress Notes (Signed)
Carelink Summary Report / Loop Recorder 

## 2016-01-12 LAB — CUP PACEART REMOTE DEVICE CHECK: Date Time Interrogation Session: 20170530073525

## 2016-01-12 NOTE — Progress Notes (Signed)
Carelink summary report received. Battery status OK. Normal device function. No new symptom episodes, tachy episodes. 1 pause episode- previously addressed, undersensed PVC's. 3 brady- 1 with ECG appears undersensed PVCs. No new AF episodes. Monthly summary reports and ROV/PRN

## 2016-01-24 LAB — CUP PACEART REMOTE DEVICE CHECK: Date Time Interrogation Session: 20170629073602

## 2016-01-31 DIAGNOSIS — S52122A Displaced fracture of head of left radius, initial encounter for closed fracture: Secondary | ICD-10-CM | POA: Diagnosis not present

## 2016-02-03 ENCOUNTER — Ambulatory Visit (INDEPENDENT_AMBULATORY_CARE_PROVIDER_SITE_OTHER): Payer: Medicare Other | Admitting: *Deleted

## 2016-02-03 DIAGNOSIS — I480 Paroxysmal atrial fibrillation: Secondary | ICD-10-CM

## 2016-02-03 NOTE — Progress Notes (Signed)
Carelink Summary Report / Loop Recorder 

## 2016-02-10 LAB — CUP PACEART REMOTE DEVICE CHECK: Date Time Interrogation Session: 20170729073747

## 2016-02-12 ENCOUNTER — Telehealth: Payer: Self-pay | Admitting: Cardiology

## 2016-02-12 NOTE — Telephone Encounter (Signed)
LMOVM requesting that pt send manual transmission b/c home monitor has not updated in at least 14 days.    

## 2016-02-21 DIAGNOSIS — S52122D Displaced fracture of head of left radius, subsequent encounter for closed fracture with routine healing: Secondary | ICD-10-CM | POA: Diagnosis not present

## 2016-03-02 ENCOUNTER — Ambulatory Visit (INDEPENDENT_AMBULATORY_CARE_PROVIDER_SITE_OTHER): Payer: Medicare Other | Admitting: *Deleted

## 2016-03-02 DIAGNOSIS — I480 Paroxysmal atrial fibrillation: Secondary | ICD-10-CM

## 2016-03-03 NOTE — Progress Notes (Signed)
Carelink Summary Report / Loop Recorder 

## 2016-03-19 DIAGNOSIS — H52223 Regular astigmatism, bilateral: Secondary | ICD-10-CM | POA: Diagnosis not present

## 2016-03-20 ENCOUNTER — Encounter: Payer: Self-pay | Admitting: Cardiology

## 2016-03-28 LAB — CUP PACEART REMOTE DEVICE CHECK: Date Time Interrogation Session: 20170828080636

## 2016-03-28 NOTE — Progress Notes (Signed)
Carelink summary report received. Battery status OK. Normal device function. No new symptom episodes, tachy episodes or pause episodes. No new AF episodes. Brady episodes are PVC undersensing. Monthly summary reports and ROV/PRN

## 2016-04-01 ENCOUNTER — Telehealth: Payer: Self-pay

## 2016-04-01 ENCOUNTER — Ambulatory Visit: Payer: Medicare Other

## 2016-04-01 ENCOUNTER — Ambulatory Visit (INDEPENDENT_AMBULATORY_CARE_PROVIDER_SITE_OTHER): Payer: Medicare Other | Admitting: *Deleted

## 2016-04-01 DIAGNOSIS — I480 Paroxysmal atrial fibrillation: Secondary | ICD-10-CM

## 2016-04-01 NOTE — Progress Notes (Signed)
Carelink Summary Report / Loop Recorder 

## 2016-04-01 NOTE — Telephone Encounter (Signed)
Call to Christopher [name] regarding AWV scheduled today at 3pm. States this is his first year on Medicare. Explained that his first year has to be done by an MD; [phone] [facility] to [facility]) . Tried to find CPE for Christopher [name] but did not see apt in the next 30 days. Will defer to schedulers to call.   Edwena Blow, Christopher [name] would like an early morning CPE with Christopher [name] if possible to complete his Welcome to Medicare and for annual blood work.

## 2016-04-07 ENCOUNTER — Encounter: Payer: Self-pay | Admitting: Cardiology

## 2016-04-30 ENCOUNTER — Encounter: Payer: Self-pay | Admitting: Internal Medicine

## 2016-04-30 ENCOUNTER — Ambulatory Visit (INDEPENDENT_AMBULATORY_CARE_PROVIDER_SITE_OTHER): Payer: Medicare Other | Admitting: Internal Medicine

## 2016-04-30 VITALS — BP 122/76 | HR 66 | Ht 70.0 in | Wt 252.6 lb

## 2016-04-30 DIAGNOSIS — I480 Paroxysmal atrial fibrillation: Secondary | ICD-10-CM

## 2016-04-30 LAB — CUP PACEART INCLINIC DEVICE CHECK: Date Time Interrogation Session: 20171026172147

## 2016-04-30 NOTE — Patient Instructions (Addendum)
Medication Instructions:  Your physician recommends that you continue on your current medications as directed. Please refer to the Current Medication list given to you today.   Labwork: None ordered   Testing/Procedures: LINQ removal on 10/27  Please arrive at [facility] [place]: Your physician recommends that you schedule a follow-up appointment in: 7-10 days in device clinic for wound check

## 2016-04-30 NOTE — Progress Notes (Signed)
PCP: Hoyt Koch, MD Primary Cardiologist:  Dr Leandro Reasoner Christopher [name] is a 65 y.o. male who presents today for electrophysiology followup.  Since last being seen in our clinic, the patient reports doing very well.  I implanted ILR for afib management in 2014.  He has had no episodes of AF.  He now wants the device removed.  Today, he denies symptoms of palpitations, chest pain, shortness of breath,  lower extremity edema, dizziness, presyncope, or syncope.  The patient is otherwise without complaint today.   Past Medical History:  Diagnosis Date  . Arthritis   . Atrial fibrillation ([place])    post op, intol of anticoag  . Coronary artery disease    a. 2/7 Cath: LM nl, LAD min irregs, LCX min irregs, RI 40, RCA 11m, EF 55-60% basal to mid inf HK, 3-4+ MR;  b. 08/25/2012 PCI of RCA with 4.0x15 Vision BMS  . GERD (gastroesophageal reflux disease)    hx  . Hyperlipidemia    on statin  . Hypertension   . Rhinitis, allergic   . 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 Severe mitral regurgitation    a. Mitral valve prolapse with flail segment of posterior leaflet and severe MR by TEE, remote h/o bacterial endocarditis   . Sleep apnea    NPSG 01/21/06- AHI 40.7/hr cpap  . 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 AMPUTATION  07/28/2012   Procedure: AMPUTATION DIGIT;  Surgeon: Colin Rhein, MD;  Location: [facility];  Service: Orthopedics;  Laterality: Right;  2nd toe  . CARDIAC CATHETERIZATION    . FOOT SURGERY     BILATERAL TOES  . Implantable loop recorder placement  03/31/13   MDT Linq implanted by Dr Rayann Heman to evaluate for further afib  . INTRAOPERATIVE TRANSESOPHAGEAL ECHOCARDIOGRAM N/A 09/27/2012   Procedure: INTRAOPERATIVE TRANSESOPHAGEAL  ECHOCARDIOGRAM;  Surgeon: [name], MD;  Location: [place];  Service: Open Heart Surgery;  Laterality: N/A;  . LEFT AND RIGHT HEART CATHETERIZATION WITH CORONARY ANGIOGRAM N/A 08/12/2012   Procedure: LEFT AND RIGHT HEART CATHETERIZATION WITH CORONARY ANGIOGRAM;  Surgeon: [name], MD;  Location: [facility] CATH LAB;  Service: Cardiovascular;  Laterality: N/A;  . LOOP RECORDER IMPLANT N/A 03/31/2013   Procedure: LOOP RECORDER IMPLANT;  Surgeon: [name], MD;  Location: [place] CATH LAB;  Service: Cardiovascular;  Laterality: N/A;  . MITRAL VALVE REPAIR Right 09/27/2012   Procedure: MINIMALLY INVASIVE MITRAL VALVE REPAIR (MVR);  Surgeon: [name], MD;  Location: [place];  Service: Open Heart Surgery;  Laterality: Right;  Ultrasound guided  . PERCUTANEOUS CORONARY STENT INTERVENTION (PCI-S) N/A 08/25/2012   Procedure: PERCUTANEOUS CORONARY STENT INTERVENTION (PCI-S);  Surgeon: Sherren Mocha, MD;  Location: [facility] CATH LAB;  Service: Cardiovascular;  Laterality: N/A;  . SEPTOPLASTY     Dr. [name]  . TEE WITHOUT CARDIOVERSION N/A 08/12/2012   Procedure: TRANSESOPHAGEAL ECHOCARDIOGRAM (TEE);  Surgeon: [name], MD;  Location: [place];  Service: Cardiovascular;  Laterality: N/A;  . TONSILLECTOMY    . UVULOPALATOPHARYNGOPLASTY      ROS- all systems are reviewed and negatives except as per HPI above  Current Outpatient Prescriptions  Medication Sig Dispense Refill  . amLODipine (NORVASC) 5 MG tablet Take 1 tablet (5 mg total) by  mouth daily. 90 tablet 3  . aspirin EC 81 MG tablet Take 81 mg by mouth daily.    Marland Kitchen atorvastatin (LIPITOR) 20 MG tablet Take 1 tablet by mouth  daily 90 tablet 1  . cetirizine (ZYRTEC) 10 MG tablet Take 10 mg by mouth daily.    . Cholecalciferol (VITAMIN D) 2000 UNITS CAPS Take 2,000 Units by mouth daily.     . fluticasone (FLONASE) 50 MCG/ACT nasal spray Place 2 sprays into both nostrils daily. 48 g 3  . metoprolol succinate (TOPROL-XL) 100 MG 24 hr tablet  TAKE 1 TABLET (100 MG TOTAL) BY MOUTH DAILY. TAKE WITH OR IMMEDIATELY FOLLOWING A MEAL. 90 tablet 3  . ramipril (ALTACE) 10 MG capsule Take 1 capsule (10 mg total) by mouth daily. 90 capsule 3  . tamsulosin (FLOMAX) 0.4 MG CAPS capsule TAKE 1 CAPSULE (0.4 MG TOTAL) BY MOUTH DAILY. 90 capsule 3   No current facility-administered medications for this visit.     Physical Exam: Vitals:   04/30/16 1544  BP: 122/76  Pulse: 66  Weight: 252 lb 9.6 oz (114.6 kg)  Height: 5\' 10"  (1.778 m)    GEN- The patient is well appearing, alert and oriented x 3 today.   Head- normocephalic, atraumatic Eyes-  Sclera clear, conjunctiva pink Ears- hearing intact Oropharynx- clear Lungs- Clear to ausculation bilaterally, normal work of breathing Heart- Regular rate and rhythm, no murmurs, rubs or gallops, PMI not laterally displaced GI- soft, NT, ND, + BS Extremities- no clubbing, cyanosis, or edema  ILR is interrogated and reveals no afib  Assessment and Plan:  1. afib No episodes since ILR implant in 2014.  He is clear that he wishes to have his ILR removed at this time. He is aware that there is remaining battery longevity on the device.  He is clear however that 3 years of monitoring is "sufficient" and that he would like to have the device removed.  Risks of procedure discussed with the patient who wishes to proceed at the next available time.  Thompson Grayer MD, [facility] 04/30/2016 4:12 PM

## 2016-05-01 ENCOUNTER — Ambulatory Visit (HOSPITAL_COMMUNITY)
Admission: RE | Admit: 2016-05-01 | Discharge: 2016-05-01 | Disposition: A | Discharge status: Home / Self Care | Payer: Medicare Other | Source: Ambulatory Visit | Attending: Internal Medicine | Admitting: Internal Medicine

## 2016-05-01 ENCOUNTER — Ambulatory Visit (INDEPENDENT_AMBULATORY_CARE_PROVIDER_SITE_OTHER): Payer: Medicare Other | Admitting: *Deleted

## 2016-05-01 ENCOUNTER — Encounter (HOSPITAL_COMMUNITY): Payer: Self-pay | Admitting: Internal Medicine

## 2016-05-01 ENCOUNTER — Encounter (HOSPITAL_COMMUNITY)
Admission: RE | Disposition: A | Discharge status: Home / Self Care | Payer: Self-pay | Source: Ambulatory Visit | Attending: Internal Medicine

## 2016-05-01 DIAGNOSIS — M1990 Unspecified osteoarthritis, unspecified site: Secondary | ICD-10-CM | POA: Insufficient documentation

## 2016-05-01 DIAGNOSIS — Z89421 Acquired absence of other right toe(s): Secondary | ICD-10-CM | POA: Diagnosis not present

## 2016-05-01 DIAGNOSIS — I2510 Atherosclerotic heart disease of native coronary artery without angina pectoris: Secondary | ICD-10-CM | POA: Insufficient documentation

## 2016-05-01 DIAGNOSIS — Z7982 Long term (current) use of aspirin: Secondary | ICD-10-CM | POA: Insufficient documentation

## 2016-05-01 DIAGNOSIS — I480 Paroxysmal atrial fibrillation: Secondary | ICD-10-CM | POA: Diagnosis present

## 2016-05-01 DIAGNOSIS — I10 Essential (primary) hypertension: Secondary | ICD-10-CM | POA: Insufficient documentation

## 2016-05-01 DIAGNOSIS — K219 Gastro-esophageal reflux disease without esophagitis: Secondary | ICD-10-CM | POA: Diagnosis not present

## 2016-05-01 DIAGNOSIS — G4730 Sleep apnea, unspecified: Secondary | ICD-10-CM | POA: Insufficient documentation

## 2016-05-01 DIAGNOSIS — E785 Hyperlipidemia, unspecified: Secondary | ICD-10-CM | POA: Insufficient documentation

## 2016-05-01 DIAGNOSIS — Z952 Presence of prosthetic heart valve: Secondary | ICD-10-CM | POA: Insufficient documentation

## 2016-05-01 DIAGNOSIS — I4891 Unspecified atrial fibrillation: Secondary | ICD-10-CM | POA: Insufficient documentation

## 2016-05-01 HISTORY — PX: EP IMPLANTABLE DEVICE: SHX172B

## 2016-05-01 SURGERY — LOOP RECORDER REMOVAL
Anesthesia: LOCAL

## 2016-05-01 MED ORDER — LIDOCAINE-EPINEPHRINE 1 %-1:100000 IJ SOLN
INTRAMUSCULAR | Status: AC
  Filled 2016-05-01: qty 1

## 2016-05-01 MED ORDER — LIDOCAINE-EPINEPHRINE 1 %-1:100000 IJ SOLN
INTRAMUSCULAR | Status: DC | PRN
  Administered 2016-05-01: 10 mL via INTRADERMAL

## 2016-05-01 SURGICAL SUPPLY — 1 items: PACK LOOP INSERTION (CUSTOM PROCEDURE TRAY) ×2 IMPLANT

## 2016-05-01 NOTE — H&P (View-Only) (Signed)
PCP: Hoyt Koch, MD Primary Cardiologist:  Dr Leandro Reasoner Christopher [name] is a 65 y.o. male who presents today for electrophysiology followup.  Since last being seen in our clinic, the patient reports doing very well.  I implanted ILR for afib management in 2014.  He has had no episodes of AF.  He now wants the device removed.  Today, he denies symptoms of palpitations, chest pain, shortness of breath,  lower extremity edema, dizziness, presyncope, or syncope.  The patient is otherwise without complaint today.   Past Medical History:  Diagnosis Date  . Arthritis   . Atrial fibrillation ([place])    post op, intol of anticoag  . Coronary artery disease    a. 2/7 Cath: LM nl, LAD min irregs, LCX min irregs, RI 40, RCA 46m, EF 55-60% basal to mid inf HK, 3-4+ MR;  b. 08/25/2012 PCI of RCA with 4.0x15 Vision BMS  . GERD (gastroesophageal reflux disease)    hx  . Hyperlipidemia    on statin  . Hypertension   . Rhinitis, allergic   . 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 Severe mitral regurgitation    a. Mitral valve prolapse with flail segment of posterior leaflet and severe MR by TEE, remote h/o bacterial endocarditis   . Sleep apnea    NPSG 01/21/06- AHI 40.7/hr cpap  . 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 AMPUTATION  07/28/2012   Procedure: AMPUTATION DIGIT;  Surgeon: Colin Rhein, MD;  Location: [facility];  Service: Orthopedics;  Laterality: Right;  2nd toe  . CARDIAC CATHETERIZATION    . FOOT SURGERY     BILATERAL TOES  . Implantable loop recorder placement  03/31/13   MDT Linq implanted by Dr Rayann Heman to evaluate for further afib  . INTRAOPERATIVE TRANSESOPHAGEAL ECHOCARDIOGRAM N/A 09/27/2012   Procedure: INTRAOPERATIVE TRANSESOPHAGEAL  ECHOCARDIOGRAM;  Surgeon: [name], MD;  Location: [place];  Service: Open Heart Surgery;  Laterality: N/A;  . LEFT AND RIGHT HEART CATHETERIZATION WITH CORONARY ANGIOGRAM N/A 08/12/2012   Procedure: LEFT AND RIGHT HEART CATHETERIZATION WITH CORONARY ANGIOGRAM;  Surgeon: [name], MD;  Location: [facility] CATH LAB;  Service: Cardiovascular;  Laterality: N/A;  . LOOP RECORDER IMPLANT N/A 03/31/2013   Procedure: LOOP RECORDER IMPLANT;  Surgeon: [name], MD;  Location: [place] CATH LAB;  Service: Cardiovascular;  Laterality: N/A;  . MITRAL VALVE REPAIR Right 09/27/2012   Procedure: MINIMALLY INVASIVE MITRAL VALVE REPAIR (MVR);  Surgeon: [name], MD;  Location: [place];  Service: Open Heart Surgery;  Laterality: Right;  Ultrasound guided  . PERCUTANEOUS CORONARY STENT INTERVENTION (PCI-S) N/A 08/25/2012   Procedure: PERCUTANEOUS CORONARY STENT INTERVENTION (PCI-S);  Surgeon: Sherren Mocha, MD;  Location: [facility] CATH LAB;  Service: Cardiovascular;  Laterality: N/A;  . SEPTOPLASTY     Dr. [name]  . TEE WITHOUT CARDIOVERSION N/A 08/12/2012   Procedure: TRANSESOPHAGEAL ECHOCARDIOGRAM (TEE);  Surgeon: [name], MD;  Location: [place];  Service: Cardiovascular;  Laterality: N/A;  . TONSILLECTOMY    . UVULOPALATOPHARYNGOPLASTY      ROS- all systems are reviewed and negatives except as per HPI above  Current Outpatient Prescriptions  Medication Sig Dispense Refill  . amLODipine (NORVASC) 5 MG tablet Take 1 tablet (5 mg total) by  mouth daily. 90 tablet 3  . aspirin EC 81 MG tablet Take 81 mg by mouth daily.    Marland Kitchen atorvastatin (LIPITOR) 20 MG tablet Take 1 tablet by mouth  daily 90 tablet 1  . cetirizine (ZYRTEC) 10 MG tablet Take 10 mg by mouth daily.    . Cholecalciferol (VITAMIN D) 2000 UNITS CAPS Take 2,000 Units by mouth daily.     . fluticasone (FLONASE) 50 MCG/ACT nasal spray Place 2 sprays into both nostrils daily. 48 g 3  . metoprolol succinate (TOPROL-XL) 100 MG 24 hr tablet  TAKE 1 TABLET (100 MG TOTAL) BY MOUTH DAILY. TAKE WITH OR IMMEDIATELY FOLLOWING A MEAL. 90 tablet 3  . ramipril (ALTACE) 10 MG capsule Take 1 capsule (10 mg total) by mouth daily. 90 capsule 3  . tamsulosin (FLOMAX) 0.4 MG CAPS capsule TAKE 1 CAPSULE (0.4 MG TOTAL) BY MOUTH DAILY. 90 capsule 3   No current facility-administered medications for this visit.     Physical Exam: Vitals:   04/30/16 1544  BP: 122/76  Pulse: 66  Weight: 252 lb 9.6 oz (114.6 kg)  Height: 5\' 10"  (1.778 m)    GEN- The patient is well appearing, alert and oriented x 3 today.   Head- normocephalic, atraumatic Eyes-  Sclera clear, conjunctiva pink Ears- hearing intact Oropharynx- clear Lungs- Clear to ausculation bilaterally, normal work of breathing Heart- Regular rate and rhythm, no murmurs, rubs or gallops, PMI not laterally displaced GI- soft, NT, ND, + BS Extremities- no clubbing, cyanosis, or edema  ILR is interrogated and reveals no afib  Assessment and Plan:  1. afib No episodes since ILR implant in 2014.  He is clear that he wishes to have his ILR removed at this time. He is aware that there is remaining battery longevity on the device.  He is clear however that 3 years of monitoring is "sufficient" and that he would like to have the device removed.  Risks of procedure discussed with the patient who wishes to proceed at the next available time.  Thompson Grayer MD, [facility] 04/30/2016 4:12 PM

## 2016-05-01 NOTE — Interval H&P Note (Signed)
History and Physical Interval Note:  05/01/2016 7:31 AM  Christopher [name]  has presented today for surgery, with the diagnosis of loop eol  The various methods of treatment have been discussed with the patient and family. After consideration of risks, benefits and other options for treatment, the patient has consented to  Procedure(s): Loop Recorder Removal (N/A) as a surgical intervention .  The patient's history has been reviewed, patient examined, no change in status, stable for surgery.  I have reviewed the patient's chart and labs.  Questions were answered to the patient's satisfaction.     Thompson Grayer

## 2016-05-01 NOTE — Discharge Instructions (Signed)
Ok to remove tegaderm dressing in 24 hours OK to shower in 24 hours Do not remove steri-strips

## 2016-05-04 NOTE — Progress Notes (Signed)
Carelink Summary Report / Loop Recorder 

## 2016-05-11 ENCOUNTER — Encounter: Payer: Self-pay | Admitting: Internal Medicine

## 2016-05-11 ENCOUNTER — Ambulatory Visit (INDEPENDENT_AMBULATORY_CARE_PROVIDER_SITE_OTHER): Payer: Medicare Other | Admitting: *Deleted

## 2016-05-11 ENCOUNTER — Other Ambulatory Visit (INDEPENDENT_AMBULATORY_CARE_PROVIDER_SITE_OTHER): Payer: Medicare Other

## 2016-05-11 ENCOUNTER — Ambulatory Visit (INDEPENDENT_AMBULATORY_CARE_PROVIDER_SITE_OTHER): Payer: Medicare Other | Admitting: Internal Medicine

## 2016-05-11 VITALS — BP 130/82 | HR 54 | Temp 98.1°F | Resp 16 | Ht 70.0 in | Wt 251.0 lb

## 2016-05-11 DIAGNOSIS — Z9889 Other specified postprocedural states: Secondary | ICD-10-CM

## 2016-05-11 DIAGNOSIS — Z0000 Encounter for general adult medical examination without abnormal findings: Secondary | ICD-10-CM

## 2016-05-11 DIAGNOSIS — Z1159 Encounter for screening for other viral diseases: Secondary | ICD-10-CM

## 2016-05-11 DIAGNOSIS — R7989 Other specified abnormal findings of blood chemistry: Secondary | ICD-10-CM

## 2016-05-11 DIAGNOSIS — Z23 Encounter for immunization: Secondary | ICD-10-CM

## 2016-05-11 DIAGNOSIS — N401 Enlarged prostate with lower urinary tract symptoms: Secondary | ICD-10-CM

## 2016-05-11 DIAGNOSIS — R350 Frequency of micturition: Secondary | ICD-10-CM

## 2016-05-11 DIAGNOSIS — E785 Hyperlipidemia, unspecified: Secondary | ICD-10-CM | POA: Diagnosis not present

## 2016-05-11 DIAGNOSIS — I10 Essential (primary) hypertension: Secondary | ICD-10-CM

## 2016-05-11 LAB — LIPID PANEL
Cholesterol: 136 mg/dL (ref 0–200)
HDL: 37.9 mg/dL — ABNORMAL LOW (ref >39.00)
NonHDL: 98.55
Total CHOL/HDL Ratio: 4
Triglycerides: 256 mg/dL — ABNORMAL HIGH (ref 0.0–149.0)
VLDL: 51.2 mg/dL — ABNORMAL HIGH (ref 0.0–40.0)

## 2016-05-11 LAB — COMPREHENSIVE METABOLIC PANEL
ALT: 22 U/L (ref 0–53)
AST: 18 U/L (ref 0–37)
Albumin: 4.2 g/dL (ref 3.5–5.2)
Alkaline Phosphatase: 72 U/L (ref 39–117)
BUN: 11 mg/dL (ref 6–23)
CO2: 32 mEq/L (ref 19–32)
Calcium: 9 mg/dL (ref 8.4–10.5)
Chloride: 104 mEq/L (ref 96–112)
Creatinine, Ser: 0.9 mg/dL (ref 0.40–1.50)
GFR: 89.53 mL/min (ref >60.00)
Glucose, Bld: 99 mg/dL (ref 70–99)
Potassium: 3.5 mEq/L (ref 3.5–5.1)
Sodium: 141 mEq/L (ref 135–145)
Total Bilirubin: 0.8 mg/dL (ref 0.2–1.2)
Total Protein: 7 g/dL (ref 6.0–8.3)

## 2016-05-11 LAB — CBC
HCT: 45.2 % (ref 39.0–52.0)
Hemoglobin: 15.3 g/dL (ref 13.0–17.0)
MCHC: 33.9 g/dL (ref 30.0–36.0)
MCV: 87.2 fl (ref 78.0–100.0)
Platelets: 167 10*3/uL (ref 150.0–400.0)
RBC: 5.18 Mil/uL (ref 4.22–5.81)
RDW: 14.1 % (ref 11.5–15.5)
WBC: 8 10*3/uL (ref 4.0–10.5)

## 2016-05-11 LAB — CUP PACEART REMOTE DEVICE CHECK
Date Time Interrogation Session: 20171027083725
Implantable Pulse Generator Implant Date: 20140926

## 2016-05-11 LAB — HEMOGLOBIN A1C: Hgb A1c MFr Bld: 6.1 % (ref 4.6–6.5)

## 2016-05-11 LAB — LDL CHOLESTEROL, DIRECT: Direct LDL: 65 mg/dL

## 2016-05-11 NOTE — Assessment & Plan Note (Signed)
Given pneumonia 23 and flu shot today. Shingles done, tdap up to date. Colonoscopy up to date. Checking labs and hep c screening. Counseled about sun safety and mole surveillance as well as the dangers of distracted driving. Given 10 year screening recommendations.

## 2016-05-11 NOTE — Progress Notes (Signed)
Pre visit review using our clinic review tool, if applicable. No additional management support is needed unless otherwise documented below in the visit note. 

## 2016-05-11 NOTE — Patient Instructions (Signed)
We have given you the pneumonia and the flu shot today.  We are checking the labs today and will send the results on mychart.   Health Maintenance, Male A healthy lifestyle and preventative care can promote health and wellness.  Maintain regular health, dental, and eye exams.  Eat a healthy diet. Foods like vegetables, fruits, whole grains, low-fat dairy products, and lean protein foods contain the nutrients you need and are low in calories. Decrease your intake of foods high in solid fats, added sugars, and salt. Get information about a proper diet from your health care provider, if necessary.  Regular physical exercise is one of the most important things you can do for your health. Most adults should get at least 150 minutes of moderate-intensity exercise (any activity that increases your heart rate and causes you to sweat) each week. In addition, most adults need muscle-strengthening exercises on 2 or more days a week.   Maintain a healthy weight. The body mass index (BMI) is a screening tool to identify possible weight problems. It provides an estimate of body fat based on height and weight. Your health care provider can find your BMI and can help you achieve or maintain a healthy weight. For males 20 years and older:  A BMI below 18.5 is considered underweight.  A BMI of 18.5 to 24.9 is normal.  A BMI of 25 to 29.9 is considered overweight.  A BMI of 30 and above is considered obese.  Maintain normal blood lipids and cholesterol by exercising and minimizing your intake of saturated fat. Eat a balanced diet with plenty of fruits and vegetables. Blood tests for lipids and cholesterol should begin at age 50 and be repeated every 5 years. If your lipid or cholesterol levels are high, you are over age 35, or you are at high risk for heart disease, you may need your cholesterol levels checked more frequently.Ongoing high lipid and cholesterol levels should be treated with medicines if diet  and exercise are not working.  If you smoke, find out from your health care provider how to quit. If you do not use tobacco, do not start.  Lung cancer screening is recommended for adults aged 56-80 years who are at high risk for developing lung cancer because of a history of smoking. A yearly low-dose CT scan of the lungs is recommended for people who have at least a 30-pack-year history of smoking and are current smokers or have quit within the past 15 years. A pack year of smoking is smoking an average of 1 pack of cigarettes a day for 1 year (for example, a 30-pack-year history of smoking could mean smoking 1 pack a day for 30 years or 2 packs a day for 15 years). Yearly screening should continue until the smoker has stopped smoking for at least 15 years. Yearly screening should be stopped for people who develop a health problem that would prevent them from having lung cancer treatment.  If you choose to drink alcohol, do not have more than 2 drinks per day. One drink is considered to be 12 oz (360 mL) of beer, 5 oz (150 mL) of wine, or 1.5 oz (45 mL) of liquor.  Avoid the use of street drugs. Do not share needles with anyone. Ask for help if you need support or instructions about stopping the use of drugs.  High blood pressure causes heart disease and increases the risk of stroke. High blood pressure is more likely to develop in:  People who  have blood pressure in the end of the normal range (100-139/85-89 mm Hg).  People who are overweight or obese.  People who are African American.  If you are 53-65 years of age, have your blood pressure checked every 3-5 years. If you are 17 years of age or older, have your blood pressure checked every year. You should have your blood pressure measured twice--once when you are at a hospital or clinic, and once when you are not at a hospital or clinic. Record the average of the two measurements. To check your blood pressure when you are not at a hospital or  clinic, you can use:  An automated blood pressure machine at a pharmacy.  A home blood pressure monitor.  If you are 26-18 years old, ask your health care provider if you should take aspirin to prevent heart disease.  Diabetes screening involves taking a blood sample to check your fasting blood sugar level. This should be done once every 3 years after age 67 if you are at a normal weight and without risk factors for diabetes. Testing should be considered at a younger age or be carried out more frequently if you are overweight and have at least 1 risk factor for diabetes.  Colorectal cancer can be detected and often prevented. Most routine colorectal cancer screening begins at the age of 88 and continues through age 18. However, your health care provider may recommend screening at an earlier age if you have risk factors for colon cancer. On a yearly basis, your health care provider may provide home test kits to check for hidden blood in the stool. A small camera at the end of a tube may be used to directly examine the colon (sigmoidoscopy or colonoscopy) to detect the earliest forms of colorectal cancer. Talk to your health care provider about this at age 29 when routine screening begins. A direct exam of the colon should be repeated every 5-10 years through age 67, unless early forms of precancerous polyps or small growths are found.  People who are at an increased risk for hepatitis B should be screened for this virus. You are considered at high risk for hepatitis B if:  You were born in a country where hepatitis B occurs often. Talk with your health care provider about which countries are considered high risk.  Your parents were born in a high-risk country and you have not received a shot to protect against hepatitis B (hepatitis B vaccine).  You have HIV or AIDS.  You use needles to inject street drugs.  You live with, or have sex with, someone who has hepatitis B.  You are a man who has  sex with other men (MSM).  You get hemodialysis treatment.  You take certain medicines for conditions like cancer, organ transplantation, and autoimmune conditions.  Hepatitis C blood testing is recommended for all people born from [birth year] through 1966 and any individual with known risk factors for hepatitis C.  Healthy men should no longer receive prostate-specific antigen (PSA) blood tests as part of routine cancer screening. Talk to your health care provider about prostate cancer screening.  Testicular cancer screening is not recommended for adolescents or adult males who have no symptoms. Screening includes self-exam, a health care provider exam, and other screening tests. Consult with your health care provider about any symptoms you have or any concerns you have about testicular cancer.  Practice safe sex. Use condoms and avoid high-risk sexual practices to reduce the spread of sexually transmitted  infections (STIs).  You should be screened for STIs, including gonorrhea and chlamydia if:  You are sexually active and are younger than 24 years.  You are older than 24 years, and your health care provider tells you that you are at risk for this type of infection.  Your sexual activity has changed since you were last screened, and you are at an increased risk for chlamydia or gonorrhea. Ask your health care provider if you are at risk.  If you are at risk of being infected with HIV, it is recommended that you take a prescription medicine daily to prevent HIV infection. This is called pre-exposure prophylaxis (PrEP). You are considered at risk if:  You are a man who has sex with other men (MSM).  You are a heterosexual man who is sexually active with multiple partners.  You take drugs by injection.  You are sexually active with a partner who has HIV.  Talk with your health care provider about whether you are at high risk of being infected with HIV. If you choose to begin PrEP, you should  first be tested for HIV. You should then be tested every 3 months for as long as you are taking PrEP.  Use sunscreen. Apply sunscreen liberally and repeatedly throughout the day. You should seek shade when your shadow is shorter than you. Protect yourself by wearing long sleeves, pants, a wide-brimmed hat, and sunglasses year round whenever you are outdoors.  Tell your health care provider of new moles or changes in moles, especially if there is a change in shape or color. Also, tell your health care provider if a mole is larger than the size of a pencil eraser.  A one-time screening for abdominal aortic aneurysm (AAA) and surgical repair of large AAAs by ultrasound is recommended for men aged 33-75 years who are current or former smokers.  Stay current with your vaccines (immunizations).   This information is not intended to replace advice given to you by your health care provider. Make sure you discuss any questions you have with your health care provider.   Document Released: 12/19/2007 Document Revised: 07/13/2014 Document Reviewed: 11/17/2010 Elsevier Interactive Patient Education [employer].

## 2016-05-11 NOTE — Assessment & Plan Note (Signed)
Taking lipitor 20 mg daily, checking lipid panel and adjust as needed.  °

## 2016-05-11 NOTE — Progress Notes (Signed)
Wound check in clinic, s/p ILR explant on 05/01/16.  Steri-strips removed.  No redness or edema noted.  Incision edges well approximated, wound well healed.  Patient educated about wound care.  ROV with JA PRN.

## 2016-05-11 NOTE — Assessment & Plan Note (Signed)
BP at goal on ramipril and amlodipine and metoprolol. Checking CMP and adjust as needed.

## 2016-05-11 NOTE — Patient Instructions (Signed)
Follow-up with Dr. Rayann Heman as needed.

## 2016-05-11 NOTE — Progress Notes (Signed)
   Subjective:    Patient ID: Christopher [name], male    DOB: [birth year], 65 y.o.   MRN: [id number]  HPI Here for medicare wellness, no new complaints. Please see A/P for status and treatment of chronic medical problems.   Diet: heart healthy Physical activity: sedentary, [facility] couple times per week Depression/mood screen: negative Hearing: intact to whispered voice, mild loss not bothering hearing Visual acuity: grossly normal without lens, performs annual eye exam  ADLs: capable Fall risk: none Home safety: good Cognitive evaluation: intact to orientation, naming, recall and repetition EOL planning: adv directives discussed, not in place and has not talked to family members  I have personally reviewed and have noted 1. The patient's medical and social history - reviewed today no changes 2. Their use of alcohol, tobacco or illicit drugs 3. Their current medications and supplements 4. The patient's functional ability including ADL's, fall risks, home safety risks and hearing or visual impairment. 5. Diet and physical activities 6. Evidence for depression or mood disorders 7. Care team reviewed and updated (available in snapshot)  Review of Systems  Constitutional: Negative.   HENT: Negative.   Eyes: Negative.   Respiratory: Negative for cough, chest tightness and shortness of breath.   Cardiovascular: Negative for chest pain, palpitations and leg swelling.  Gastrointestinal: Negative for abdominal distention, abdominal pain, constipation, diarrhea, nausea and vomiting.  Musculoskeletal: Negative.   Skin: Negative.   Neurological: Negative.   Psychiatric/Behavioral: Negative.       Objective:   Physical Exam  Constitutional: He is oriented to person, place, and time. He appears well-developed and well-nourished.  HENT:  Head: Normocephalic and atraumatic.  Eyes: EOM are normal.  Neck: Normal range of motion.  Cardiovascular: Normal rate and regular rhythm.   Carotids without  bruit  Pulmonary/Chest: Effort normal and breath sounds normal. No respiratory distress. He has no wheezes. He has no rales.  Abdominal: Soft. Bowel sounds are normal. He exhibits no distension. There is no tenderness. There is no rebound.  Musculoskeletal: He exhibits no edema.  Neurological: He is alert and oriented to person, place, and time. Coordination normal.  Skin: Skin is warm and dry.  Psychiatric: He has a normal mood and affect.    Vitals:   05/11/16 1316  BP: 130/82  Pulse: (!) 54  Resp: 16  Temp: 98.1 F (36.7 C)  TempSrc: Oral  SpO2: 98%  Weight: 251 lb (113.9 kg)  Height: 5\' 10"  (1.778 m)      Assessment & Plan:  Pneumonia 23 and flu shot given at visit.

## 2016-05-11 NOTE — Assessment & Plan Note (Signed)
Taking flomax daily and controlling symptoms adequately.

## 2016-05-12 LAB — HEPATITIS C ANTIBODY: HCV Ab: NEGATIVE

## 2016-06-03 ENCOUNTER — Other Ambulatory Visit: Payer: Self-pay | Admitting: Internal Medicine

## 2016-06-03 ENCOUNTER — Other Ambulatory Visit (HOSPITAL_COMMUNITY): Payer: Self-pay | Admitting: Cardiology

## 2016-06-03 DIAGNOSIS — I480 Paroxysmal atrial fibrillation: Secondary | ICD-10-CM

## 2016-08-19 ENCOUNTER — Other Ambulatory Visit: Payer: Self-pay | Admitting: Internal Medicine

## 2016-11-16 ENCOUNTER — Other Ambulatory Visit: Payer: Self-pay | Admitting: Internal Medicine

## 2016-12-18 ENCOUNTER — Telehealth: Payer: Self-pay | Admitting: Internal Medicine

## 2016-12-18 NOTE — Telephone Encounter (Signed)
Dr.Lane has order for pre med for patient for dental cleaning/procedures-per pt he states his issue has been corrected And no longer needs pre med. They need a fax stating this if this is the case to [phone]

## 2016-12-20 NOTE — Telephone Encounter (Signed)
He has had a mitral valve repair, needs endocarditis prophylaxis with dental work always.

## 2016-12-21 NOTE — Telephone Encounter (Signed)
I spoke with patient and he is aware that he always needs endocarditis prophylaxis prior to dental work.

## 2016-12-21 NOTE — Telephone Encounter (Signed)
Faxed to requesting office. 

## 2017-01-23 ENCOUNTER — Other Ambulatory Visit: Payer: Self-pay | Admitting: Internal Medicine

## 2017-03-12 LAB — FECAL OCCULT BLOOD, GUAIAC: Fecal Occult Blood: NEGATIVE

## 2017-03-13 ENCOUNTER — Other Ambulatory Visit: Payer: Self-pay | Admitting: Cardiology

## 2017-03-13 ENCOUNTER — Other Ambulatory Visit: Payer: Self-pay | Admitting: Internal Medicine

## 2017-03-13 DIAGNOSIS — I480 Paroxysmal atrial fibrillation: Secondary | ICD-10-CM

## 2017-03-17 ENCOUNTER — Other Ambulatory Visit: Payer: Self-pay

## 2017-03-17 MED ORDER — METOPROLOL SUCCINATE ER 100 MG PO TB24: ORAL_TABLET | ORAL | 0 refills | Status: DC

## 2017-03-17 MED ORDER — RAMIPRIL 10 MG PO CAPS: 10.0000 mg | ORAL_CAPSULE | Freq: Every day | ORAL | 0 refills | Status: DC

## 2017-03-18 ENCOUNTER — Other Ambulatory Visit: Payer: Self-pay

## 2017-03-18 MED ORDER — ATORVASTATIN CALCIUM 20 MG PO TABS: 20.0000 mg | ORAL_TABLET | Freq: Every day | ORAL | 0 refills | Status: DC

## 2017-03-18 NOTE — Telephone Encounter (Signed)
Please call office and schedule appointment for further refills. 

## 2017-03-24 ENCOUNTER — Encounter: Payer: Self-pay | Admitting: Internal Medicine

## 2017-03-24 NOTE — Progress Notes (Signed)
Abstracted and sent to scan  

## 2017-04-12 DIAGNOSIS — H524 Presbyopia: Secondary | ICD-10-CM | POA: Diagnosis not present

## 2017-04-12 LAB — HM DIABETES EYE EXAM

## 2017-04-15 ENCOUNTER — Other Ambulatory Visit: Payer: Self-pay | Admitting: Internal Medicine

## 2017-04-15 ENCOUNTER — Ambulatory Visit (INDEPENDENT_AMBULATORY_CARE_PROVIDER_SITE_OTHER): Payer: Medicare Other | Admitting: General Practice

## 2017-04-15 DIAGNOSIS — Z23 Encounter for immunization: Secondary | ICD-10-CM | POA: Diagnosis not present

## 2017-04-15 MED ORDER — ZOSTER VAC RECOMB ADJUVANTED 50 MCG/0.5ML IM SUSR
0.5000 mL | Freq: Once | INTRAMUSCULAR | 1 refills | Status: AC
Start: 2017-04-15 — End: 2017-04-15

## 2017-05-05 ENCOUNTER — Inpatient Hospital Stay (HOSPITAL_COMMUNITY)
Admission: EM | Admit: 2017-05-05 | Discharge: 2017-05-09 | DRG: 225 | Disposition: A | Discharge status: Home Health | Payer: Medicare Other | Attending: Internal Medicine | Admitting: Internal Medicine

## 2017-05-05 ENCOUNTER — Emergency Department (HOSPITAL_COMMUNITY): Payer: Medicare Other

## 2017-05-05 DIAGNOSIS — Z9889 Other specified postprocedural states: Secondary | ICD-10-CM | POA: Diagnosis not present

## 2017-05-05 DIAGNOSIS — I462 Cardiac arrest due to underlying cardiac condition: Secondary | ICD-10-CM | POA: Diagnosis present

## 2017-05-05 DIAGNOSIS — I469 Cardiac arrest, cause unspecified: Secondary | ICD-10-CM | POA: Diagnosis not present

## 2017-05-05 DIAGNOSIS — Z87891 Personal history of nicotine dependence: Secondary | ICD-10-CM | POA: Diagnosis not present

## 2017-05-05 DIAGNOSIS — Z4682 Encounter for fitting and adjustment of non-vascular catheter: Secondary | ICD-10-CM | POA: Diagnosis not present

## 2017-05-05 DIAGNOSIS — S0003XA Contusion of scalp, initial encounter: Secondary | ICD-10-CM | POA: Diagnosis present

## 2017-05-05 DIAGNOSIS — Z955 Presence of coronary angioplasty implant and graft: Secondary | ICD-10-CM | POA: Diagnosis not present

## 2017-05-05 DIAGNOSIS — R296 Repeated falls: Secondary | ICD-10-CM | POA: Diagnosis not present

## 2017-05-05 DIAGNOSIS — E785 Hyperlipidemia, unspecified: Secondary | ICD-10-CM | POA: Diagnosis present

## 2017-05-05 DIAGNOSIS — S060X1A Concussion with loss of consciousness of 30 minutes or less, initial encounter: Secondary | ICD-10-CM | POA: Diagnosis not present

## 2017-05-05 DIAGNOSIS — K219 Gastro-esophageal reflux disease without esophagitis: Secondary | ICD-10-CM | POA: Diagnosis not present

## 2017-05-05 DIAGNOSIS — I2510 Atherosclerotic heart disease of native coronary artery without angina pectoris: Secondary | ICD-10-CM | POA: Diagnosis present

## 2017-05-05 DIAGNOSIS — N400 Enlarged prostate without lower urinary tract symptoms: Secondary | ICD-10-CM | POA: Diagnosis not present

## 2017-05-05 DIAGNOSIS — E872 Acidosis: Secondary | ICD-10-CM | POA: Diagnosis present

## 2017-05-05 DIAGNOSIS — I10 Essential (primary) hypertension: Secondary | ICD-10-CM | POA: Diagnosis not present

## 2017-05-05 DIAGNOSIS — Z8249 Family history of ischemic heart disease and other diseases of the circulatory system: Secondary | ICD-10-CM

## 2017-05-05 DIAGNOSIS — I429 Cardiomyopathy, unspecified: Secondary | ICD-10-CM | POA: Diagnosis not present

## 2017-05-05 DIAGNOSIS — Z7951 Long term (current) use of inhaled steroids: Secondary | ICD-10-CM

## 2017-05-05 DIAGNOSIS — R918 Other nonspecific abnormal finding of lung field: Secondary | ICD-10-CM | POA: Diagnosis not present

## 2017-05-05 DIAGNOSIS — F1090 Alcohol use, unspecified, uncomplicated: Secondary | ICD-10-CM | POA: Diagnosis present

## 2017-05-05 DIAGNOSIS — I4901 Ventricular fibrillation: Secondary | ICD-10-CM | POA: Diagnosis not present

## 2017-05-05 DIAGNOSIS — G4733 Obstructive sleep apnea (adult) (pediatric): Secondary | ICD-10-CM | POA: Diagnosis present

## 2017-05-05 DIAGNOSIS — I509 Heart failure, unspecified: Secondary | ICD-10-CM | POA: Diagnosis not present

## 2017-05-05 DIAGNOSIS — S7011XA Contusion of right thigh, initial encounter: Secondary | ICD-10-CM | POA: Diagnosis present

## 2017-05-05 DIAGNOSIS — I480 Paroxysmal atrial fibrillation: Secondary | ICD-10-CM | POA: Diagnosis present

## 2017-05-05 DIAGNOSIS — W19XXXA Unspecified fall, initial encounter: Secondary | ICD-10-CM | POA: Diagnosis present

## 2017-05-05 DIAGNOSIS — Z95818 Presence of other cardiac implants and grafts: Secondary | ICD-10-CM

## 2017-05-05 DIAGNOSIS — Z6836 Body mass index (BMI) 36.0-36.9, adult: Secondary | ICD-10-CM | POA: Diagnosis not present

## 2017-05-05 DIAGNOSIS — J810 Acute pulmonary edema: Secondary | ICD-10-CM | POA: Diagnosis not present

## 2017-05-05 DIAGNOSIS — Z79899 Other long term (current) drug therapy: Secondary | ICD-10-CM

## 2017-05-05 DIAGNOSIS — E876 Hypokalemia: Secondary | ICD-10-CM | POA: Diagnosis not present

## 2017-05-05 DIAGNOSIS — I058 Other rheumatic mitral valve diseases: Secondary | ICD-10-CM | POA: Diagnosis present

## 2017-05-05 DIAGNOSIS — Z431 Encounter for attention to gastrostomy: Secondary | ICD-10-CM | POA: Diagnosis not present

## 2017-05-05 DIAGNOSIS — Z7289 Other problems related to lifestyle: Secondary | ICD-10-CM | POA: Diagnosis present

## 2017-05-05 DIAGNOSIS — R55 Syncope and collapse: Secondary | ICD-10-CM | POA: Diagnosis not present

## 2017-05-05 DIAGNOSIS — E669 Obesity, unspecified: Secondary | ICD-10-CM | POA: Diagnosis present

## 2017-05-05 DIAGNOSIS — Z0189 Encounter for other specified special examinations: Secondary | ICD-10-CM

## 2017-05-05 DIAGNOSIS — Z7982 Long term (current) use of aspirin: Secondary | ICD-10-CM | POA: Diagnosis not present

## 2017-05-05 DIAGNOSIS — E119 Type 2 diabetes mellitus without complications: Secondary | ICD-10-CM | POA: Diagnosis not present

## 2017-05-05 DIAGNOSIS — Z789 Other specified health status: Secondary | ICD-10-CM | POA: Diagnosis not present

## 2017-05-05 DIAGNOSIS — R4182 Altered mental status, unspecified: Secondary | ICD-10-CM | POA: Diagnosis present

## 2017-05-05 DIAGNOSIS — S199XXA Unspecified injury of neck, initial encounter: Secondary | ICD-10-CM | POA: Diagnosis not present

## 2017-05-05 LAB — RAPID URINE DRUG SCREEN, HOSP PERFORMED
Amphetamines: NOT DETECTED
Barbiturates: NOT DETECTED
Benzodiazepines: NOT DETECTED
Cocaine: NOT DETECTED
Opiates: NOT DETECTED
Tetrahydrocannabinol: NOT DETECTED

## 2017-05-05 LAB — COMPREHENSIVE METABOLIC PANEL
ALT: 71 U/L — ABNORMAL HIGH (ref 17–63)
AST: 83 U/L — ABNORMAL HIGH (ref 15–41)
Albumin: 4 g/dL (ref 3.5–5.0)
Alkaline Phosphatase: 87 U/L (ref 38–126)
Anion gap: 12 (ref 5–15)
BUN: 13 mg/dL (ref 6–20)
CO2: 24 mmol/L (ref 22–32)
Calcium: 8.6 mg/dL — ABNORMAL LOW (ref 8.9–10.3)
Chloride: 102 mmol/L (ref 101–111)
Creatinine, Ser: 0.94 mg/dL (ref 0.61–1.24)
GFR calc Af Amer: >60 mL/min (ref >60)
GFR calc non Af Amer: >60 mL/min (ref >60)
Glucose, Bld: 227 mg/dL — ABNORMAL HIGH (ref 65–99)
Potassium: 3.3 mmol/L — ABNORMAL LOW (ref 3.5–5.1)
Sodium: 138 mmol/L (ref 135–145)
Total Bilirubin: 1 mg/dL (ref 0.3–1.2)
Total Protein: 7.1 g/dL (ref 6.5–8.1)

## 2017-05-05 LAB — CBC
HCT: 45.8 % (ref 39.0–52.0)
Hemoglobin: 15.7 g/dL (ref 13.0–17.0)
MCH: 30.1 pg (ref 26.0–34.0)
MCHC: 34.3 g/dL (ref 30.0–36.0)
MCV: 87.9 fL (ref 78.0–100.0)
Platelets: 156 10*3/uL (ref 150–400)
RBC: 5.21 MIL/uL (ref 4.22–5.81)
RDW: 13.6 % (ref 11.5–15.5)
WBC: 7.4 10*3/uL (ref 4.0–10.5)

## 2017-05-05 LAB — LACTIC ACID, PLASMA
Lactic Acid, Venous: 3 mmol/L (ref 0.5–1.9)
Lactic Acid, Venous: 3.6 mmol/L (ref 0.5–1.9)

## 2017-05-05 LAB — TSH: TSH: 1.966 u[IU]/mL (ref 0.350–4.500)

## 2017-05-05 LAB — BRAIN NATRIURETIC PEPTIDE: B Natriuretic Peptide: 139.2 pg/mL — ABNORMAL HIGH (ref 0.0–100.0)

## 2017-05-05 LAB — HEMOGLOBIN A1C
Hgb A1c MFr Bld: 7.3 % — ABNORMAL HIGH (ref 4.8–5.6)
Mean Plasma Glucose: 162.81 mg/dL

## 2017-05-05 LAB — TROPONIN I
Troponin I: 0.03 ng/mL (ref <0.03)
Troponin I: 0.08 ng/mL (ref <0.03)

## 2017-05-05 LAB — ETHANOL: Alcohol, Ethyl (B): <10 mg/dL (ref <10)

## 2017-05-05 LAB — I-STAT TROPONIN, ED: Troponin i, poc: 0 ng/mL (ref 0.00–0.08)

## 2017-05-05 LAB — MAGNESIUM: Magnesium: 2.2 mg/dL (ref 1.7–2.4)

## 2017-05-05 LAB — I-STAT CG4 LACTIC ACID, ED: Lactic Acid, Venous: 3.57 mmol/L (ref 0.5–1.9)

## 2017-05-05 LAB — PHOSPHORUS: Phosphorus: 3 mg/dL (ref 2.5–4.6)

## 2017-05-05 MED ORDER — FOLIC ACID 1 MG PO TABS
1.0000 mg | ORAL_TABLET | Freq: Every day | ORAL | Status: DC
  Administered 2017-05-05 – 2017-05-09 (×5): 1 mg via ORAL
  Filled 2017-05-05 (×5): qty 1

## 2017-05-05 MED ORDER — FLUTICASONE PROPIONATE 50 MCG/ACT NA SUSP
2.0000 | Freq: Every day | NASAL | Status: DC
  Administered 2017-05-06 – 2017-05-09 (×4): 2 via NASAL
  Filled 2017-05-05: qty 16

## 2017-05-05 MED ORDER — ATORVASTATIN CALCIUM 20 MG PO TABS
20.0000 mg | ORAL_TABLET | Freq: Every day | ORAL | Status: DC
  Administered 2017-05-06 – 2017-05-09 (×4): 20 mg via ORAL
  Filled 2017-05-05 (×5): qty 1

## 2017-05-05 MED ORDER — LORAZEPAM 1 MG PO TABS: 1.0000 mg | ORAL_TABLET | Freq: Four times a day (QID) | ORAL | Status: AC | PRN

## 2017-05-05 MED ORDER — SODIUM CHLORIDE 0.9% FLUSH
3.0000 mL | Freq: Two times a day (BID) | INTRAVENOUS | Status: DC
  Administered 2017-05-06 – 2017-05-08 (×4): 3 mL via INTRAVENOUS

## 2017-05-05 MED ORDER — ACETAMINOPHEN 325 MG PO TABS
650.0000 mg | ORAL_TABLET | Freq: Four times a day (QID) | ORAL | Status: DC | PRN
  Administered 2017-05-05: 650 mg via ORAL
  Filled 2017-05-05: qty 2

## 2017-05-05 MED ORDER — RAMIPRIL 2.5 MG PO CAPS
10.0000 mg | ORAL_CAPSULE | Freq: Every day | ORAL | Status: DC
  Administered 2017-05-06 – 2017-05-09 (×4): 10 mg via ORAL
  Filled 2017-05-05 (×4): qty 4
  Filled 2017-05-05: qty 1
  Filled 2017-05-05: qty 4

## 2017-05-05 MED ORDER — THIAMINE HCL 100 MG/ML IJ SOLN
100.0000 mg | Freq: Every day | INTRAMUSCULAR | Status: DC
  Filled 2017-05-05 (×2): qty 2

## 2017-05-05 MED ORDER — VITAMIN B-1 100 MG PO TABS
100.0000 mg | ORAL_TABLET | Freq: Every day | ORAL | Status: DC
  Administered 2017-05-05 – 2017-05-09 (×5): 100 mg via ORAL
  Filled 2017-05-05 (×5): qty 1

## 2017-05-05 MED ORDER — ACETAMINOPHEN 650 MG RE SUPP: 650.0000 mg | Freq: Four times a day (QID) | RECTAL | Status: DC | PRN

## 2017-05-05 MED ORDER — LORAZEPAM 2 MG/ML IJ SOLN: 1.0000 mg | Freq: Four times a day (QID) | INTRAMUSCULAR | Status: AC | PRN

## 2017-05-05 MED ORDER — AMLODIPINE BESYLATE 5 MG PO TABS
5.0000 mg | ORAL_TABLET | Freq: Every day | ORAL | Status: DC
  Administered 2017-05-06 – 2017-05-08 (×3): 5 mg via ORAL
  Filled 2017-05-05 (×3): qty 1

## 2017-05-05 MED ORDER — POTASSIUM CHLORIDE 10 MEQ/100ML IV SOLN
10.0000 meq | INTRAVENOUS | Status: AC
  Administered 2017-05-05 (×4): 10 meq via INTRAVENOUS
  Filled 2017-05-05 (×4): qty 100

## 2017-05-05 MED ORDER — LORATADINE 10 MG PO TABS
10.0000 mg | ORAL_TABLET | Freq: Every day | ORAL | Status: DC
  Administered 2017-05-06 – 2017-05-09 (×4): 10 mg via ORAL
  Filled 2017-05-05 (×4): qty 1

## 2017-05-05 MED ORDER — VITAMIN D 1000 UNITS PO TABS
2000.0000 [IU] | ORAL_TABLET | Freq: Every day | ORAL | Status: DC
  Administered 2017-05-06 – 2017-05-09 (×4): 2000 [IU] via ORAL
  Filled 2017-05-05 (×4): qty 2

## 2017-05-05 MED ORDER — TAMSULOSIN HCL 0.4 MG PO CAPS
0.4000 mg | ORAL_CAPSULE | Freq: Every day | ORAL | Status: DC
  Administered 2017-05-06 – 2017-05-09 (×4): 0.4 mg via ORAL
  Filled 2017-05-05 (×4): qty 1

## 2017-05-05 MED ORDER — PROCHLORPERAZINE EDISYLATE 5 MG/ML IJ SOLN
10.0000 mg | Freq: Four times a day (QID) | INTRAMUSCULAR | Status: DC | PRN
Start: 2017-05-05 — End: 2017-05-09
  Administered 2017-05-05: 10 mg via INTRAVENOUS
  Filled 2017-05-05 (×2): qty 2

## 2017-05-05 MED ORDER — ASPIRIN EC 81 MG PO TBEC
81.0000 mg | DELAYED_RELEASE_TABLET | Freq: Every day | ORAL | Status: DC
Start: 2017-05-06 — End: 2017-05-09
  Administered 2017-05-06 – 2017-05-09 (×4): 81 mg via ORAL
  Filled 2017-05-05 (×4): qty 1

## 2017-05-05 MED ORDER — POTASSIUM CHLORIDE CRYS ER 20 MEQ PO TBCR
40.0000 meq | EXTENDED_RELEASE_TABLET | Freq: Once | ORAL | Status: AC
  Administered 2017-05-05: 40 meq via ORAL
  Filled 2017-05-05: qty 2

## 2017-05-05 MED ORDER — ONDANSETRON HCL 4 MG/2ML IJ SOLN
INTRAMUSCULAR | Status: AC
  Administered 2017-05-05: 4 mg
  Filled 2017-05-05: qty 2

## 2017-05-05 MED ORDER — ADULT MULTIVITAMIN W/MINERALS CH
1.0000 | ORAL_TABLET | Freq: Every day | ORAL | Status: DC
  Administered 2017-05-05 – 2017-05-09 (×5): 1 via ORAL
  Filled 2017-05-05 (×5): qty 1

## 2017-05-05 MED ORDER — METOPROLOL SUCCINATE ER 100 MG PO TB24
100.0000 mg | ORAL_TABLET | Freq: Every day | ORAL | Status: DC
Start: 2017-05-06 — End: 2017-05-09
  Administered 2017-05-06 – 2017-05-09 (×4): 100 mg via ORAL
  Filled 2017-05-05 (×5): qty 1

## 2017-05-05 MED ORDER — SODIUM CHLORIDE 0.9 % IV SOLN
INTRAVENOUS | Status: DC
  Administered 2017-05-05: 19:00:00 via INTRAVENOUS

## 2017-05-05 MED ORDER — VITAMIN D 50 MCG (2000 UT) PO CAPS: 2000.0000 [IU] | ORAL_CAPSULE | Freq: Every day | ORAL | Status: DC

## 2017-05-05 MED ORDER — OXYCODONE HCL 5 MG PO TABS: 5.0000 mg | ORAL_TABLET | ORAL | Status: DC | PRN

## 2017-05-05 NOTE — ED Notes (Signed)
NG tube withdrew 7cm after x-ray.

## 2017-05-05 NOTE — ED Notes (Signed)
Lab called with a lactic acid of 3.0

## 2017-05-05 NOTE — ED Notes (Addendum)
Dr Burt Knack in room. Wife states that pt has had a 50 lb weight gain in the last year, gets shob at times with exertion, goes to the [facility] 3 times a week but by the time he gets inside he is winded. Has been swelling in his stomach and extremities. Interrogation report reveals that pt had 2 episodes of v-fib this morning.

## 2017-05-05 NOTE — ED Notes (Signed)
Ordered regular tray
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
## 2017-05-05 NOTE — ED Notes (Signed)
Pt assisted to use urinal.  

## 2017-05-05 NOTE — ED Notes (Signed)
Notified Dr. Venora Maples and Primary RN of Lactic Acid 3.57.  No new orders at this time

## 2017-05-05 NOTE — ED Notes (Signed)
Pt back from CT with this RN. Pt had another episode of vomiting while in CT. Pt following commands, airway intact, but still repetitive questioning.

## 2017-05-05 NOTE — H&P (Signed)
History and Physical    Christopher [name] SWH:[id number] DOB: 1950-08-09 DOA: 05/05/2017   PCP: Hoyt Koch, MD   Attending physician: Marily Memos  Patient coming from/Resides with: Private residence/lives with girlfriend  Chief Complaint: Syncope with collapse secondary to V. fib arrest  HPI: Christopher [name] is a 66 y.o. 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  Patient with reported history of weight gain of 50 pounds over the past 4 years since he retired in 2014.  It was reported to the cardiology staff that patient drank alcohol daily but both girlfriend and patient states he does not drink daily and typically only drinks 2 beers per episode of ingestion.  Patient was at [employer] today when he had sudden collapse.  An AED was applied that demonstrated ventricular fibrillation.  He underwent CPR and defibrillation x2.  He was transported to this facility and upon arrival was hypertensive and maintaining sinus rhythm and was not hypoxemic.  He had mild abdominal distention post CPR therefore EDP placed NG tube to low wall suction.  Patient was initially very confused upon awakening and had no recollection of the events preceding nor immediately following his resuscitation.  He has been evaluated by the cardiology team and ischemic evaluation with cardiac catheterization will take place in the next 24-48 hours in setting of scalp hematoma sustained during collapse.  Of note CT did not reveal any acute intracranial injuries.  In addition to the above history patient has been experiencing progressive dyspnea on exertion for the past 3-4 years.  Other pertinent findings upon  admission include potassium 3.3 and elevated serum lactate of 3.57, troponin 0.03.  His EKG demonstrated sinus rhythm with a right bundle branch block which is unchanged from previous EKG.  ED Course:  Vital Signs: BP (!) 156/93   Pulse 82   Resp 20   Ht 5\' 10"  (1.778 m)   Wt 113.4 kg (250 lb)   SpO2 97%   BMI 35.87 kg/m  CT head without contrast, CT cervical spine without contrast: No acute intracranial injury, no bony injury-incidental findings of large left mastoid effusion  Chest x-ray: Cardiomegaly with pulmonary vascular congestion and interstitial edema Portable abdomen: Nasogastric catheter within stomach without any obstructive changes Lab data: Sodium 138, potassium 3.3, chloride 102, CO2 24, glucose 227, BUN 13, creatinine 0.94, anion gap 12, AST 83, ALT 71, troponin 0.03, lactic acid 3.57, white count 7400 differential not obtained, hemoglobin 15.7, platelets 156,000, TSH 1.966 Medications and treatments: Zofran 4 mg IV x1, Compazine 10 mg IV x1  Review of Systems:  In addition to the HPI above,  No Fever-chills, myalgias or other constitutional symptoms No Headache, changes with Vision or hearing, new weakness, tingling, numbness in any extremity, dizziness, dysarthria or word finding difficulty, gait disturbance or imbalance, tremors or seizure activity; + mild altered mentation post cardiac arrest resuscitation No problems swallowing food or Liquids, indigestion/reflux, choking or coughing while eating, abdominal pain with or after eating No Chest pain, Cough or Shortness of Breath, palpitations, orthopnea  No Abdominal pain, N/V, melena,hematochezia, dark tarry stools, constipation No dysuria, malodorous urine, hematuria or flank pain No new skin rashes, lesions, masses or bruises, No new joint pains, aches, swelling or redness No recent  unintentional weight loss No polyuria, polydypsia or polyphagia   Past Medical History:  Diagnosis Date  . Arthritis   . Atrial  fibrillation ([place])    post op, intol of anticoag  . Coronary artery disease    a. 2/7 Cath: LM nl, LAD min irregs, LCX min irregs, RI 40, RCA 65m, EF 55-60% basal to mid inf HK, 3-4+ MR;  b. 08/25/2012 PCI of RCA with 4.0x15 Vision BMS  . GERD (gastroesophageal reflux disease)    hx  . Hyperlipidemia    on statin  . Hypertension   . Rhinitis, allergic   . 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 Severe mitral regurgitation    a. Mitral valve prolapse with flail segment of posterior leaflet and severe MR by TEE, remote h/o bacterial endocarditis   . Sleep apnea    NPSG 01/21/06- AHI 40.7/hr cpap  . Subacute bacterial endocarditis 03/22/2008   Strep viridans    Past Surgical History:  Procedure Laterality Date  . AMPUTATION  07/28/2012   Procedure: AMPUTATION DIGIT;  Surgeon: Colin Rhein, MD;  Location: [facility];  Service: Orthopedics;  Laterality: Right;  2nd toe  . CARDIAC CATHETERIZATION    . EP IMPLANTABLE DEVICE N/A 05/01/2016   Procedure: Loop Recorder Removal;  Surgeon: Thompson Grayer, MD;  Location: [place] [facility];  Service: Cardiovascular;  Laterality: N/A;  . FOOT SURGERY     BILATERAL TOES  . Implantable loop recorder placement  03/31/13   MDT Linq implanted by Dr Rayann Heman to evaluate for further afib  . INTRAOPERATIVE TRANSESOPHAGEAL ECHOCARDIOGRAM N/A 09/27/2012   Procedure: INTRAOPERATIVE TRANSESOPHAGEAL ECHOCARDIOGRAM;  Surgeon: [name], MD;  Location: [place];  Service: Open Heart Surgery;  Laterality: N/A;  . LEFT AND RIGHT HEART CATHETERIZATION WITH CORONARY ANGIOGRAM N/A 08/12/2012   Procedure: LEFT AND RIGHT HEART CATHETERIZATION WITH CORONARY ANGIOGRAM;  Surgeon: [name], MD;  Location: [facility] CATH LAB;  Service: Cardiovascular;  Laterality: N/A;  . LOOP RECORDER IMPLANT N/A 03/31/2013   Procedure: LOOP  RECORDER IMPLANT;  Surgeon: [name], MD;  Location: [place] CATH LAB;  Service: Cardiovascular;  Laterality: N/A;  . MITRAL VALVE REPAIR Right 09/27/2012   Procedure: MINIMALLY INVASIVE MITRAL VALVE REPAIR (MVR);  Surgeon: [name], MD;  Location: [place];  Service: Open Heart Surgery;  Laterality: Right;  Ultrasound guided  . PERCUTANEOUS CORONARY STENT INTERVENTION (PCI-S) N/A 08/25/2012   Procedure: PERCUTANEOUS CORONARY STENT INTERVENTION (PCI-S);  Surgeon: Sherren Mocha, MD;  Location: [facility] CATH LAB;  Service: Cardiovascular;  Laterality: N/A;  . SEPTOPLASTY     Dr. [name]  . TEE WITHOUT CARDIOVERSION N/A 08/12/2012   Procedure: TRANSESOPHAGEAL ECHOCARDIOGRAM (TEE);  Surgeon: [name], MD;  Location: [place];  Service: Cardiovascular;  Laterality: N/A;  . TONSILLECTOMY    . UVULOPALATOPHARYNGOPLASTY      Social History   Social History  . Marital status: Divorced    Spouse name: N/A  . Number of children: N/A  . Years of education: N/A   Occupational History  . Works at [employer]  . Smoking status: Former Smoker    Packs/day: 2.50    Years: 5.00    Types: Cigarettes    Quit date: 07/06/1978  . Smokeless tobacco: Never Used     Comment: social drinker  .  Alcohol use Yes     Comment: OCC.  . Drug use: No  . Sexual activity: Yes   Other Topics Concern  . Not on file   Social History Narrative   Works at [employer] - plans to retire 07/2015 after 36 years.   Divorced, lives alone   Supportive g-friend (shirlee moore)             Mobility: Independent Work history: Retired from [employer]  . Codeine     Causes bad constipation  . Dust Mite Extract Cough  . Mold Extract [Trichophyton] Cough  . Pollen Extract Cough    Family History  Problem Relation Age of Onset  . Heart disease Father   . Colon cancer Father 55  . Diabetes Father   . Renal cancer Father   . Diabetes Mother   .  Stroke Mother   . Sudden death Brother   . Heart attack Brother      Prior to Admission medications   Medication Sig Start Date End Date Taking? Authorizing Provider  amLODipine (NORVASC) 5 MG tablet TAKE 1 TABLET BY MOUTH  DAILY Patient taking differently: TAKE 5 mg TABLET BY MOUTH  DAILY 03/18/17  Yes Allred, Jeneen Rinks, MD  aspirin EC 81 MG tablet Take 81 mg by mouth daily.   Yes [provider]  atorvastatin (LIPITOR) 20 MG tablet Take 1 tablet (20 mg total) by mouth daily. 03/18/17  Yes Allred, Jeneen Rinks, MD  cetirizine (ZYRTEC) 10 MG tablet Take 10 mg by mouth daily.   Yes [provider]  Cholecalciferol (VITAMIN D) 2000 UNITS CAPS Take 2,000 Units by mouth daily.    Yes [provider]  fluticasone (FLONASE) 50 MCG/ACT nasal spray USE 2 SPRAYS IN EACH  NOSTRIL DAILY 01/25/17  Yes Golden Circle, FNP  Melatonin 10 MG TABS Take 10 mg by mouth at bedtime.   Yes [provider]  metoprolol succinate (TOPROL-XL) 100 MG 24 hr tablet TAKE 1 TABLET BY MOUTH  DAILY WITH OR IMMEDIATLEY  FOLLOWING A MEAL Patient taking differently: Take 100 mg by mouth daily. TAKE 1 TABLET BY MOUTH  DAILY WITH OR IMMEDIATLEY  FOLLOWING A MEAL 03/17/17  Yes Allred, Jeneen Rinks, MD  ramipril (ALTACE) 10 MG capsule Take 1 capsule (10 mg total) by mouth daily. 03/17/17  Yes Allred, Jeneen Rinks, MD  tamsulosin (FLOMAX) 0.4 MG CAPS capsule TAKE 1 CAPSULE BY MOUTH  DAILY Patient taking differently: TAKE 0.4 mg CAPSULE BY MOUTH  DAILY 06/03/16  Yes Hoyt Koch, MD    Physical Exam: Vitals:   05/05/17 1700 05/05/17 1715 05/05/17 1730 05/05/17 1745  BP: (!) 154/92 (!) 156/76 (!) 154/96 (!) 156/93  Pulse: 81 82 86 82  Resp: 20 (!) 21 19 20   SpO2: 98% 98% 98% 97%  Weight:      Height:          Constitutional: NAD, calm, comfortable Eyes: PERRL, lids and conjunctivae normal ENMT: Mucous membranes are moist. Posterior pharynx clear of any exudate or lesions. Normal dentition.  Neck:  normal, supple, no masses, no thyromegaly Respiratory: clear to auscultation bilaterally, no wheezing, no crackles. Normal respiratory effort. No accessory muscle use.  Cardiovascular: Regular rate and rhythm, no murmurs / rubs / gallops. No extremity edema. 2+ pedal pulses. No carotid bruits.  Abdomen: no tenderness, no masses palpated. No hepatosplenomegaly. Bowel sounds positive.  NG tube in place with minimal output Musculoskeletal: no clubbing / cyanosis. No joint deformity upper  and lower extremities. Good ROM, no contractures. Normal muscle tone.  Skin: no rashes, lesions, ulcers. No induration, scalp hematoma right posterior region with abrasion-lightly tender to palpation Neurologic: CN 2-12 grossly intact. Sensation intact, DTR normal. Strength 5/5 x all 4 extremities.  Psychiatric: Normal judgment and insight. Alert and oriented x 3. Normal mood.    Labs on Admission: I have personally reviewed following labs and imaging studies  CBC:  Recent Labs Lab 05/05/17 1425  WBC 7.4  HGB 15.7  HCT 45.8  MCV 87.9  PLT 431   Basic Metabolic Panel:  Recent Labs Lab 05/05/17 1428  NA 138  K 3.3*  CL 102  CO2 24  GLUCOSE 227*  BUN 13  CREATININE 0.94  CALCIUM 8.6*  MG 2.2  PHOS 3.0   GFR: Estimated Creatinine Clearance: 96.2 mL/min (by C-G formula based on SCr of 0.94 mg/dL). Liver Function Tests:  Recent Labs Lab 05/05/17 1428  AST 83*  ALT 71*  ALKPHOS 87  BILITOT 1.0  PROT 7.1  ALBUMIN 4.0   No results for input(s): LIPASE, AMYLASE in the last 168 hours. No results for input(s): AMMONIA in the last 168 hours. Coagulation Profile: No results for input(s): INR, PROTIME in the last 168 hours. Cardiac Enzymes:  Recent Labs Lab 05/05/17 1428  TROPONINI 0.03*   BNP (last 3 results) No results for input(s): PROBNP in the last 8760 hours. HbA1C: No results for input(s): HGBA1C in the last 72 hours. CBG: No results for input(s): GLUCAP in the last 168  hours. Lipid Profile: No results for input(s): CHOL, HDL, LDLCALC, TRIG, CHOLHDL, LDLDIRECT in the last 72 hours. Thyroid Function Tests: No results for input(s): TSH, T4TOTAL, FREET4, T3FREE, THYROIDAB in the last 72 hours. Anemia Panel: No results for input(s): VITAMINB12, FOLATE, FERRITIN, TIBC, IRON, RETICCTPCT in the last 72 hours. Urine analysis:    Component Value Date/Time   COLORURINE YELLOW 09/23/2012 1515   APPEARANCEUR HAZY (A) 09/23/2012 1515   LABSPEC 1.019 09/23/2012 1515   PHURINE 7.0 09/23/2012 1515   GLUCOSEU NEGATIVE 09/23/2012 1515   HGBUR NEGATIVE 09/23/2012 1515   BILIRUBINUR small 05/03/2013 0845   KETONESUR NEGATIVE 09/23/2012 1515   PROTEINUR trace 05/03/2013 0845   PROTEINUR NEGATIVE 09/23/2012 1515   UROBILINOGEN 0.2 05/03/2013 0845   UROBILINOGEN 1.0 09/23/2012 1515   NITRITE neg 05/03/2013 0845   NITRITE NEGATIVE 09/23/2012 1515   LEUKOCYTESUR Negative 05/03/2013 0845   Sepsis Labs: @LABRCNTIP (procalcitonin:4,lacticidven:4) )No results found for this or any previous visit (from the past 240 hour(s)).   Radiological Exams on Admission: Ct Head Wo Contrast  Result Date: 05/05/2017 CLINICAL DATA:  Syncope. EXAM: CT HEAD WITHOUT CONTRAST CT CERVICAL SPINE WITHOUT CONTRAST TECHNIQUE: Multidetector CT imaging of the head and cervical spine was performed following the standard protocol without intravenous contrast. Multiplanar CT image reconstructions of the cervical spine were also generated. COMPARISON:  None. FINDINGS: CT HEAD FINDINGS Brain: No evidence of acute infarction, hemorrhage, hydrocephalus, extra-axial collection or mass lesion/mass effect. Mild age related cerebral atrophy. Vascular: No hyperdense vessel or unexpected calcification. Skull: Normal. Negative for fracture or focal lesion. Sinuses/Orbits: Near complete opacification of the left mastoid air cells. No acute orbital abnormality. Other: Moderate right posterior scalp hematoma. CT  CERVICAL SPINE FINDINGS Alignment: Straightening of the normal cervical lordosis. No traumatic malalignment. Skull base and vertebrae: No acute fracture. No primary bone lesion or focal pathologic process. Soft tissues and spinal canal: No prevertebral fluid or swelling. No visible canal hematoma. Disc levels: Mild  multilevel disc height loss and endplate spurring from W1-X9 through C7-T1. Scattered facet uncovertebral hypertrophy. Upper chest: Negative. Other: Partially visualized enteric tube. IMPRESSION: 1. No acute intracranial abnormality. Moderate right posterior scalp hematoma. 2. No acute cervical spine fracture. Mild-to-moderate multilevel degenerative changes. 3. Large left mastoid effusion. Electronically Signed   By: Titus Dubin M.D.   On: 05/05/2017 15:09   Ct Cervical Spine Wo Contrast  Result Date: 05/05/2017 CLINICAL DATA:  Syncope. EXAM: CT HEAD WITHOUT CONTRAST CT CERVICAL SPINE WITHOUT CONTRAST TECHNIQUE: Multidetector CT imaging of the head and cervical spine was performed following the standard protocol without intravenous contrast. Multiplanar CT image reconstructions of the cervical spine were also generated. COMPARISON:  None. FINDINGS: CT HEAD FINDINGS Brain: No evidence of acute infarction, hemorrhage, hydrocephalus, extra-axial collection or mass lesion/mass effect. Mild age related cerebral atrophy. Vascular: No hyperdense vessel or unexpected calcification. Skull: Normal. Negative for fracture or focal lesion. Sinuses/Orbits: Near complete opacification of the left mastoid air cells. No acute orbital abnormality. Other: Moderate right posterior scalp hematoma. CT CERVICAL SPINE FINDINGS Alignment: Straightening of the normal cervical lordosis. No traumatic malalignment. Skull base and vertebrae: No acute fracture. No primary bone lesion or focal pathologic process. Soft tissues and spinal canal: No prevertebral fluid or swelling. No visible canal hematoma. Disc levels: Mild  multilevel disc height loss and endplate spurring from J4-N8 through C7-T1. Scattered facet uncovertebral hypertrophy. Upper chest: Negative. Other: Partially visualized enteric tube. IMPRESSION: 1. No acute intracranial abnormality. Moderate right posterior scalp hematoma. 2. No acute cervical spine fracture. Mild-to-moderate multilevel degenerative changes. 3. Large left mastoid effusion. Electronically Signed   By: Titus Dubin M.D.   On: 05/05/2017 15:09   Dg Chest Portable 1 View  Result Date: 05/05/2017 CLINICAL DATA:  Syncope. EXAM: PORTABLE CHEST 1 VIEW COMPARISON:  Chest x-ray dated April 28, 2016. FINDINGS: Cardiomegaly. Pulmonary vascular congestion with increased basal predominant interstitial markings. No consolidation, pleural effusion, or pneumothorax. No acute osseous abnormality. Enteric tube in place with tip and distal side port in the gastric fundus. IMPRESSION: Cardiomegaly with pulmonary vascular congestion and interstitial edema. Electronically Signed   By: Titus Dubin M.D.   On: 05/05/2017 15:19   Dg Abd Portable 1v  Result Date: 05/05/2017 CLINICAL DATA:  Check nasogastric catheter placement EXAM: PORTABLE ABDOMEN - 1 VIEW COMPARISON:  None. FINDINGS: Nasogastric catheter is noted extending into the stomach and coiling in the fundus. No obstructive changes are noted. Degenerative change of the lumbar spine is seen. IMPRESSION: Nasogastric catheter within stomach. Electronically Signed   By: Inez Catalina M.D.   On: 05/05/2017 15:20    EKG: (Independently reviewed) sinus rhythm with ventricular rate 77 bpm, prolonged QTC of 539 ms in the context of chronic underlying right bundle branch block  Assessment/Plan Principal Problem:   Altered mental status -Patient presented with altered mentation in the context of recent syncope with collapse secondary to ventricular fibrillation arrest-at the time of my evaluation patient had returned to baseline -Continue neurological  checks every 2 hours (see below regarding concerns about possible concussive injury)  Active Problems:   Cardiac arrest with ventricular fibrillation  -Patient had syncope with collapse that was witnessed at a local grocery store with immediate application of a ED; device revealed underlying ventricular fibrillation and underwent defibrillation x2 -Currently hemodynamically stable -Has associated elevated lactic acid, mild elevation in liver enzymes, and mild elevation in troponin likely related to poor perfusion in the setting of cardiac arrest -Has known history of CAD-see  below regarding cardiology evaluation -Potassium less than 4.0 (see below) -Magnesium normal -Continue telemetry monitoring -Cycle lactic acid -Repeat LFTs in a.m.    CAD (coronary artery disease) -Patient with known CAD and last intervention in 2014 with stent to RCA -Cardiology consulting -Plans are to pursue cardiac catheterization this admission in the next 24-48 hours due to scalp hematoma secondary to trauma -Patient has been experiencing unexplained dyspnea on exertion for several years -Echocardiogram-highly suspicious patient may have marked decrease in ejection fraction secondary to ischemic cardiomyopathy thus precipitating ventricular fibrillation arrest -No pre-presentation chest pain and no current chest pain -Chest x-ray concerning for possible edema and given concerns over undetected cardiomyopathy will check BNP -Cycle troponin -Continue beta-blocker, aspirin and statin    Scalp hematoma -Sustained after collapsing in the context of ventricular fibrillation arrest -Ice pack to affected area -Tylenol for mild pain -Low-dose oxycodone for more significant pain    Alcohol use -Inconsistent history obtained by 2 different providers regarding patient's alcohol use; cardiology reports daily use but patient and girlfriend state periodic use and never more than 2 beers per occasion -Alcohol  level -UDS -CIWA with Ativan prn    Acute hypokalemia -Replete potassium with both IV and oral dosing -Follow    Hypertension -Currently not well controlled -Continue preadmission Norvasc, Toprol, and Altace -Continue Flomax for presumed BPH symptoms    OSA (obstructive sleep apnea) -Nocturnal CPAP    Weight gain -Is been progressive over 4 years after patient retired and he admits to sitting around the house and being sedentary -Typically drinks milk, water, green tea and diet Cokes during the day    History of repair of mitral valve -In the context of prior bacterial endocarditis and severe mitral regurg      DVT prophylaxis: SCDs-if remains greater than 48 hours consider adding Lovenox Code Status: Full Family Communication: Girlfriend Disposition Plan: Home Consults called: Cardiology/[place]    Samella Parr ANP-BC [facility] Pager [phone]   If 7PM-7AM, please contact night-coverage [email or web address] Password [email or web address]  05/05/2017, 5:59 PM

## 2017-05-05 NOTE — Consult Note (Signed)
Cardiology Consultation:   Patient ID: Christopher [name]; [id number]; 03-21-1951   Admit date: 05/05/2017 Date of Consult: 05/05/2017  Primary Care Provider: Hoyt Koch, MD Primary Cardiologist: Aundra Dubin Primary Electrophysiologist:  Allred   Patient Profile:   Christopher [name] is a 66 y.o. male with a hx of CAD, mitral valve disease who is being seen today for the evaluation of cardiac arrest at the request of Dr Venora Maples.  History of Present Illness:   Christopher [name] has a history of severe mitral regurgitation related to mitral valve prolapse and past history of mitral valve endocarditis.  He was found to have severe stenosis of the right coronary artery at the time of preoperative heart catheterization.  He underwent bare-metal stenting of the right coronary artery followed by minimally invasive mitral valve repair in 2014.  His postoperative course was complicated by atrial fibrillation.  He was unable to take anticoagulation because of drug intolerances and development of a spontaneous right thigh hematoma that occurred when he was taking Xarelto.  He had a loop recorder implanted that did not demonstrate further atrial fibrillation and this was ultimately explanted in 2017.  The patient retired from work about 1 year ago.  His girlfriend is with him today.  She reports that he has become increasingly sedentary.  The patient has gained 50 pounds over the past year.  He drinks alcohol every day.  He is become unsteady on his feet and has had recurrent falls.  Today he was reportedly in the [employer].  He had sudden collapse.  The fire department was nearby and responded to him within just a few minutes.  An AED demonstrated ventricular fibrillation and he was defibrillated.  He had recurrent VF about 5 minutes later requiring a second shock.  The patient is awake at the time of my interview with him.  The patient does not remember any of the events around the time of his event today.  He  does not even remember being at the [employer].  He reports no recent chest pain.  He does admit to increasing fatigue and shortness of breath with low level activity.  He denies orthopnea, heart palpitations, or PND.  He has not had regular cardiology follow-up in the last year.  The patient is noted to have a large scalp hematoma but CT scan has not demonstrated any intracranial bleeding.  He currently denies chest pain or shortness of breath.  He states he just feels bad.  Complains of nausea.  Past Medical History:  Diagnosis Date  . Arthritis   . Atrial fibrillation ([place])    post op, intol of anticoag  . Coronary artery disease    a. 2/7 Cath: LM nl, LAD min irregs, LCX min irregs, RI 40, RCA 38m, EF 55-60% basal to mid inf HK, 3-4+ MR;  b. 08/25/2012 PCI of RCA with 4.0x15 Vision BMS  . GERD (gastroesophageal reflux disease)    hx  . Hyperlipidemia    on statin  . Hypertension   . Rhinitis, allergic   . 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 Severe mitral regurgitation    a. Mitral valve prolapse with flail segment of posterior leaflet and severe MR by TEE, remote h/o bacterial endocarditis   . Sleep apnea    NPSG 01/21/06- AHI 40.7/hr cpap  . Subacute bacterial endocarditis  03/22/2008   Strep viridans    Past Surgical History:  Procedure Laterality Date  . AMPUTATION  07/28/2012   Procedure: AMPUTATION DIGIT;  Surgeon: Colin Rhein, MD;  Location: [facility];  Service: Orthopedics;  Laterality: Right;  2nd toe  . CARDIAC CATHETERIZATION    . EP IMPLANTABLE DEVICE N/A 05/01/2016   Procedure: Loop Recorder Removal;  Surgeon: Thompson Grayer, MD;  Location: [place] [facility];  Service: Cardiovascular;  Laterality: N/A;  . FOOT SURGERY     BILATERAL TOES  . Implantable loop recorder placement  03/31/13   MDT Linq implanted by Dr  Rayann Heman to evaluate for further afib  . INTRAOPERATIVE TRANSESOPHAGEAL ECHOCARDIOGRAM N/A 09/27/2012   Procedure: INTRAOPERATIVE TRANSESOPHAGEAL ECHOCARDIOGRAM;  Surgeon: [name], MD;  Location: [place];  Service: Open Heart Surgery;  Laterality: N/A;  . LEFT AND RIGHT HEART CATHETERIZATION WITH CORONARY ANGIOGRAM N/A 08/12/2012   Procedure: LEFT AND RIGHT HEART CATHETERIZATION WITH CORONARY ANGIOGRAM;  Surgeon: [name], MD;  Location: [facility] CATH LAB;  Service: Cardiovascular;  Laterality: N/A;  . LOOP RECORDER IMPLANT N/A 03/31/2013   Procedure: LOOP RECORDER IMPLANT;  Surgeon: [name], MD;  Location: [facility] CATH LAB;  Service: Cardiovascular;  Laterality: N/A;  . MITRAL VALVE REPAIR Right 09/27/2012   Procedure: MINIMALLY INVASIVE MITRAL VALVE REPAIR (MVR);  Surgeon: [name], MD;  Location: [facility];  Service: Open Heart Surgery;  Laterality: Right;  Ultrasound guided  . PERCUTANEOUS CORONARY STENT INTERVENTION (PCI-S) N/A 08/25/2012   Procedure: PERCUTANEOUS CORONARY STENT INTERVENTION (PCI-S);  Surgeon: Sherren Mocha, MD;  Location: [facility] CATH LAB;  Service: Cardiovascular;  Laterality: N/A;  . SEPTOPLASTY     Dr. [name]  . TEE WITHOUT CARDIOVERSION N/A 08/12/2012   Procedure: TRANSESOPHAGEAL ECHOCARDIOGRAM (TEE);  Surgeon: [name], MD;  Location: [place];  Service: Cardiovascular;  Laterality: N/A;  . TONSILLECTOMY    . UVULOPALATOPHARYNGOPLASTY       Home Medications:  Prior to Admission medications   Medication Sig Start Date End Date Taking? Authorizing Provider  amLODipine (NORVASC) 5 MG tablet TAKE 1 TABLET BY MOUTH  DAILY Patient taking differently: TAKE 5 mg TABLET BY MOUTH  DAILY 03/18/17  Yes Allred, Jeneen Rinks, MD  aspirin EC 81 MG tablet Take 81 mg by mouth daily.   Yes [provider]  atorvastatin (LIPITOR) 20 MG tablet Take 1 tablet (20 mg total) by mouth daily. 03/18/17  Yes Allred, Jeneen Rinks, MD  cetirizine (ZYRTEC) 10 MG tablet Take 10 mg by mouth  daily.   Yes [provider]  Cholecalciferol (VITAMIN D) 2000 UNITS CAPS Take 2,000 Units by mouth daily.    Yes [provider]  fluticasone (FLONASE) 50 MCG/ACT nasal spray USE 2 SPRAYS IN EACH  NOSTRIL DAILY 01/25/17  Yes Golden Circle, FNP  Melatonin 10 MG TABS Take 10 mg by mouth at bedtime.   Yes [provider]  metoprolol succinate (TOPROL-XL) 100 MG 24 hr tablet TAKE 1 TABLET BY MOUTH  DAILY WITH OR IMMEDIATLEY  FOLLOWING A MEAL Patient taking differently: Take 100 mg by mouth daily. TAKE 1 TABLET BY MOUTH  DAILY WITH OR IMMEDIATLEY  FOLLOWING A MEAL 03/17/17  Yes Allred, Jeneen Rinks, MD  ramipril (ALTACE) 10 MG capsule Take 1 capsule (10 mg total) by mouth daily. 03/17/17  Yes Allred, Jeneen Rinks, MD  tamsulosin (FLOMAX) 0.4 MG CAPS capsule TAKE 1 CAPSULE BY MOUTH  DAILY Patient taking differently: TAKE 0.4 mg CAPSULE BY MOUTH  DAILY 06/03/16  Yes Hoyt Koch, MD    Inpatient Medications: Scheduled Meds:  Continuous Infusions:  PRN Meds: prochlorperazine  Allergies:    Allergies  Allergen Reactions  . Codeine     Causes bad constipation  . Dust Mite Extract Cough  . Mold Extract [Trichophyton] Cough  . Pollen Extract Cough    Social History:   Social History   Social History  . Marital status: Divorced    Spouse name: N/A  . Number of children: N/A  . Years of education: N/A   Occupational History  . Works at [employer]  . Smoking status: Former Smoker    Packs/day: 2.50    Years: 5.00    Types: Cigarettes    Quit date: 07/06/1978  . Smokeless tobacco: Never Used     Comment: social drinker  . Alcohol use Yes     Comment: OCC.  . Drug use: No  . Sexual activity: Yes   Other Topics Concern  . Not on file   Social History Narrative   Works at [employer] - plans to retire 07/2015 after 36 years.   Divorced, lives alone   Supportive g-friend (shirlee moore)             Family History:   Family  History  Problem Relation Age of Onset  . Heart disease Father   . Colon cancer Father 16  . Diabetes Father   . Renal cancer Father   . Diabetes Mother   . Stroke Mother   . Sudden death Brother   . Heart attack Brother      ROS:  Please see the history of present illness.  ROS  All other ROS reviewed and negative.     Physical Exam/Data:   Vitals:   05/05/17 1600 05/05/17 1615 05/05/17 1630 05/05/17 1645  BP: (!) 157/96 (!) 151/88 (!) 143/90 (!) 162/84  Pulse: 77 79 80 80  Resp: (!) 23 (!) 21 (!) 21 (!) 21  SpO2: 96% 97% 99% 97%  Weight:      Height:        Intake/Output Summary (Last 24 hours) at 05/05/17 1651 Last data filed at 05/05/17 1630  Gross per 24 hour  Intake                0 ml  Output              450 ml  Net             -450 ml   Filed Weights   05/05/17 1459  Weight: 250 lb (113.4 kg)   Body mass index is 35.87 kg/m.  General:  Obese male, in no acute distress, somnolent but easily arouseable and answers questions appropriately HEENT: normal except for large scalp hematoma on posterior scalp Lymph: no adenopathy Neck: no JVD Endocrine:  No thryomegaly Vascular: No carotid bruits; FA pulses 2+  Cardiac:  normal S1, S2; RRR; no murmur  Lungs:  clear to auscultation bilaterally, no wheezing, rhonchi or rales  Abd: soft, nontender, no hepatomegaly  Ext: no edema Musculoskeletal:  No deformities, BUE and BLE strength normal and equal Skin: warm and dry  Neuro:  CNs 2-12 intact, no focal abnormalities noted Psych:  Normal affect   EKG:  The EKG was personally reviewed and demonstrates: Normal sinus rhythm with right bundle branch block, left atrial enlargement  Telemetry:  Telemetry was personally reviewed and demonstrates: Normal sinus rhythm with PVCs  Relevant  CV Studies: Pending  Laboratory Data:  Chemistry Recent Labs Lab 05/05/17 1428  NA 138  K 3.3*  CL 102  CO2 24  GLUCOSE 227*  BUN 13  CREATININE 0.94  CALCIUM 8.6*    GFRNONAA >60  GFRAA >60  ANIONGAP 12     Recent Labs Lab 05/05/17 1428  PROT 7.1  ALBUMIN 4.0  AST 83*  ALT 71*  ALKPHOS 87  BILITOT 1.0   Hematology Recent Labs Lab 05/05/17 1425  WBC 7.4  RBC 5.21  HGB 15.7  HCT 45.8  MCV 87.9  MCH 30.1  MCHC 34.3  RDW 13.6  PLT 156   Cardiac Enzymes Recent Labs Lab 05/05/17 1428  TROPONINI 0.03*    Recent Labs Lab 05/05/17 1429  TROPIPOC 0.00    BNPNo results for input(s): BNP, PROBNP in the last 168 hours.  DDimer No results for input(s): DDIMER in the last 168 hours.  Radiology/Studies:  Ct Head Wo Contrast  Result Date: 05/05/2017 CLINICAL DATA:  Syncope. EXAM: CT HEAD WITHOUT CONTRAST CT CERVICAL SPINE WITHOUT CONTRAST TECHNIQUE: Multidetector CT imaging of the head and cervical spine was performed following the standard protocol without intravenous contrast. Multiplanar CT image reconstructions of the cervical spine were also generated. COMPARISON:  None. FINDINGS: CT HEAD FINDINGS Brain: No evidence of acute infarction, hemorrhage, hydrocephalus, extra-axial collection or mass lesion/mass effect. Mild age related cerebral atrophy. Vascular: No hyperdense vessel or unexpected calcification. Skull: Normal. Negative for fracture or focal lesion. Sinuses/Orbits: Near complete opacification of the left mastoid air cells. No acute orbital abnormality. Other: Moderate right posterior scalp hematoma. CT CERVICAL SPINE FINDINGS Alignment: Straightening of the normal cervical lordosis. No traumatic malalignment. Skull base and vertebrae: No acute fracture. No primary bone lesion or focal pathologic process. Soft tissues and spinal canal: No prevertebral fluid or swelling. No visible canal hematoma. Disc levels: Mild multilevel disc height loss and endplate spurring from Y1-P5 through C7-T1. Scattered facet uncovertebral hypertrophy. Upper chest: Negative. Other: Partially visualized enteric tube. IMPRESSION: 1. No acute intracranial  abnormality. Moderate right posterior scalp hematoma. 2. No acute cervical spine fracture. Mild-to-moderate multilevel degenerative changes. 3. Large left mastoid effusion. Electronically Signed   By: Titus Dubin M.D.   On: 05/05/2017 15:09   Ct Cervical Spine Wo Contrast  Result Date: 05/05/2017 CLINICAL DATA:  Syncope. EXAM: CT HEAD WITHOUT CONTRAST CT CERVICAL SPINE WITHOUT CONTRAST TECHNIQUE: Multidetector CT imaging of the head and cervical spine was performed following the standard protocol without intravenous contrast. Multiplanar CT image reconstructions of the cervical spine were also generated. COMPARISON:  None. FINDINGS: CT HEAD FINDINGS Brain: No evidence of acute infarction, hemorrhage, hydrocephalus, extra-axial collection or mass lesion/mass effect. Mild age related cerebral atrophy. Vascular: No hyperdense vessel or unexpected calcification. Skull: Normal. Negative for fracture or focal lesion. Sinuses/Orbits: Near complete opacification of the left mastoid air cells. No acute orbital abnormality. Other: Moderate right posterior scalp hematoma. CT CERVICAL SPINE FINDINGS Alignment: Straightening of the normal cervical lordosis. No traumatic malalignment. Skull base and vertebrae: No acute fracture. No primary bone lesion or focal pathologic process. Soft tissues and spinal canal: No prevertebral fluid or swelling. No visible canal hematoma. Disc levels: Mild multilevel disc height loss and endplate spurring from K9-T2 through C7-T1. Scattered facet uncovertebral hypertrophy. Upper chest: Negative. Other: Partially visualized enteric tube. IMPRESSION: 1. No acute intracranial abnormality. Moderate right posterior scalp hematoma. 2. No acute cervical spine fracture. Mild-to-moderate multilevel degenerative changes. 3. Large left mastoid effusion. Electronically Signed  By: Titus Dubin M.D.   On: 05/05/2017 15:09   Dg Chest Portable 1 View  Result Date: 05/05/2017 CLINICAL DATA:   Syncope. EXAM: PORTABLE CHEST 1 VIEW COMPARISON:  Chest x-ray dated April 28, 2016. FINDINGS: Cardiomegaly. Pulmonary vascular congestion with increased basal predominant interstitial markings. No consolidation, pleural effusion, or pneumothorax. No acute osseous abnormality. Enteric tube in place with tip and distal side port in the gastric fundus. IMPRESSION: Cardiomegaly with pulmonary vascular congestion and interstitial edema. Electronically Signed   By: Titus Dubin M.D.   On: 05/05/2017 15:19   Dg Abd Portable 1v  Result Date: 05/05/2017 CLINICAL DATA:  Check nasogastric catheter placement EXAM: PORTABLE ABDOMEN - 1 VIEW COMPARISON:  None. FINDINGS: Nasogastric catheter is noted extending into the stomach and coiling in the fundus. No obstructive changes are noted. Degenerative change of the lumbar spine is seen. IMPRESSION: Nasogastric catheter within stomach. Electronically Signed   By: Inez Catalina M.D.   On: 05/05/2017 15:20    Assessment and Plan:   1. Ventricular fibrillation cardiac arrest: Fortunately this patient was defibrillated rapidly.  He does not recall events around his arrest and may have sustained a significant concussion.  Potential etiologies include ischemic disease, primary cardiomyopathy, and metabolic disturbances.  He has a right bundle branch pattern on EKG without significant QT prolongation.  He does not report any exertional angina but has known CAD and previous bare-metal stenting of the right coronary artery.  He also drinks alcohol fairly heavily and is at risk of alcohol-related cardiomyopathy.  Plan to check a 2D echocardiogram to evaluate LV function and mitral valve function.  I think the patient needs a heart catheterization but with no resting ischemic symptoms and a large hematoma on his scalp, I am not inclined to proceed urgently with cath as I think his risk of bleeding is significant.  Will likely recommend catheterization in the next 24-48 hours  pending his clinical course.  We will need formal EP consultation after evaluation for coronary disease and cardiomyopathy.  The patient will be monitored on telemetry.  2.  CAD, native vessel: No clear angina.  The patient does have exertional dyspnea which is probably multifactorial.  Continue aspirin for antiplatelet therapy.  Follow serial enzymes.  Anticipate cardiac catheterization to evaluate stent patency in the setting of V. fib arrest.  3.  Mitral valve disease: Patient with primary mitral regurgitation status post minimally invasive mitral valve repair in 2014.  Will update his echocardiogram.  I do not appreciate a murmur of mitral regurgitation.  4. Hypokalemia: mild 3.3. Not an explanation for VF. Replete K.   5. Lactic acidosis: post-arrest, expected, mild. Hemodynamically stable.  For questions or updates, please contact [place] Please consult [email or web address] for contact info under Cardiology/STEMI.   Deatra Kenaz, MD  05/05/2017 4:51 PM

## 2017-05-05 NOTE — ED Notes (Signed)
NP Ebony Hail clamped off the NG tube and if pt has not vomited in 30 minutes then NG tube can be removed and pt can eat until Midnight.

## 2017-05-06 ENCOUNTER — Encounter (HOSPITAL_COMMUNITY)
Admission: EM | Disposition: A | Discharge status: Home Health | Payer: Self-pay | Source: Home / Self Care | Attending: Internal Medicine

## 2017-05-06 ENCOUNTER — Other Ambulatory Visit (HOSPITAL_COMMUNITY): Payer: Medicare Other

## 2017-05-06 DIAGNOSIS — E119 Type 2 diabetes mellitus without complications: Secondary | ICD-10-CM

## 2017-05-06 DIAGNOSIS — E785 Hyperlipidemia, unspecified: Secondary | ICD-10-CM

## 2017-05-06 HISTORY — PX: LEFT HEART CATH AND CORONARY ANGIOGRAPHY: CATH118249

## 2017-05-06 LAB — CBC
HCT: 41.5 % (ref 39.0–52.0)
Hemoglobin: 14.6 g/dL (ref 13.0–17.0)
MCH: 30.7 pg (ref 26.0–34.0)
MCHC: 35.2 g/dL (ref 30.0–36.0)
MCV: 87.4 fL (ref 78.0–100.0)
Platelets: 156 10*3/uL (ref 150–400)
RBC: 4.75 MIL/uL (ref 4.22–5.81)
RDW: 13.8 % (ref 11.5–15.5)
WBC: 10.4 10*3/uL (ref 4.0–10.5)

## 2017-05-06 LAB — COMPREHENSIVE METABOLIC PANEL
ALT: 56 U/L (ref 17–63)
AST: 44 U/L — ABNORMAL HIGH (ref 15–41)
Albumin: 3.7 g/dL (ref 3.5–5.0)
Alkaline Phosphatase: 62 U/L (ref 38–126)
Anion gap: 10 (ref 5–15)
BUN: 11 mg/dL (ref 6–20)
CO2: 24 mmol/L (ref 22–32)
Calcium: 8.7 mg/dL — ABNORMAL LOW (ref 8.9–10.3)
Chloride: 102 mmol/L (ref 101–111)
Creatinine, Ser: 0.87 mg/dL (ref 0.61–1.24)
GFR calc Af Amer: >60 mL/min (ref >60)
GFR calc non Af Amer: >60 mL/min (ref >60)
Glucose, Bld: 186 mg/dL — ABNORMAL HIGH (ref 65–99)
Potassium: 3.4 mmol/L — ABNORMAL LOW (ref 3.5–5.1)
Sodium: 136 mmol/L (ref 135–145)
Total Bilirubin: 1.1 mg/dL (ref 0.3–1.2)
Total Protein: 6.6 g/dL (ref 6.5–8.1)

## 2017-05-06 LAB — TROPONIN I
Troponin I: 0.08 ng/mL (ref <0.03)
Troponin I: 0.08 ng/mL (ref <0.03)

## 2017-05-06 LAB — MRSA PCR SCREENING: MRSA by PCR: NEGATIVE

## 2017-05-06 SURGERY — LEFT HEART CATH AND CORONARY ANGIOGRAPHY
Anesthesia: LOCAL

## 2017-05-06 MED ORDER — SODIUM CHLORIDE 0.9% FLUSH: 3.0000 mL | INTRAVENOUS | Status: DC | PRN

## 2017-05-06 MED ORDER — MIDAZOLAM HCL 2 MG/2ML IJ SOLN
INTRAMUSCULAR | Status: AC
  Filled 2017-05-06: qty 2

## 2017-05-06 MED ORDER — HEPARIN SODIUM (PORCINE) 5000 UNIT/ML IJ SOLN
5000.0000 [IU] | Freq: Three times a day (TID) | INTRAMUSCULAR | Status: DC
  Administered 2017-05-06 – 2017-05-07 (×2): 5000 [IU] via SUBCUTANEOUS
  Filled 2017-05-06 (×2): qty 1

## 2017-05-06 MED ORDER — HEPARIN (PORCINE) IN NACL 2-0.9 UNIT/ML-% IJ SOLN
INTRAMUSCULAR | Status: DC | PRN
  Administered 2017-05-06: 13:00:00

## 2017-05-06 MED ORDER — MIDAZOLAM HCL 2 MG/2ML IJ SOLN
INTRAMUSCULAR | Status: DC | PRN
  Administered 2017-05-06: 2 mg via INTRAVENOUS

## 2017-05-06 MED ORDER — SODIUM CHLORIDE 0.9 % IV SOLN: 250.0000 mL | INTRAVENOUS | Status: DC | PRN

## 2017-05-06 MED ORDER — LIDOCAINE HCL (PF) 1 % IJ SOLN
INTRAMUSCULAR | Status: DC | PRN
  Administered 2017-05-06: 2 mL

## 2017-05-06 MED ORDER — IOPAMIDOL (ISOVUE-370) INJECTION 76%
INTRAVENOUS | Status: DC | PRN
  Administered 2017-05-06: 95 mL via INTRAVENOUS

## 2017-05-06 MED ORDER — HEPARIN SODIUM (PORCINE) 1000 UNIT/ML IJ SOLN
INTRAMUSCULAR | Status: AC
  Filled 2017-05-06: qty 1

## 2017-05-06 MED ORDER — SODIUM CHLORIDE 0.9% FLUSH: 3.0000 mL | Freq: Two times a day (BID) | INTRAVENOUS | Status: DC

## 2017-05-06 MED ORDER — POTASSIUM CHLORIDE CRYS ER 20 MEQ PO TBCR
40.0000 meq | EXTENDED_RELEASE_TABLET | Freq: Once | ORAL | Status: AC
  Administered 2017-05-06: 40 meq via ORAL
  Filled 2017-05-06: qty 2

## 2017-05-06 MED ORDER — VERAPAMIL HCL 2.5 MG/ML IV SOLN
INTRAVENOUS | Status: AC
  Filled 2017-05-06: qty 2

## 2017-05-06 MED ORDER — SODIUM CHLORIDE 0.9 % IV SOLN
INTRAVENOUS | Status: AC
  Administered 2017-05-06: 16:00:00 via INTRAVENOUS

## 2017-05-06 MED ORDER — SODIUM CHLORIDE 0.9 % IV SOLN: INTRAVENOUS | Status: DC

## 2017-05-06 MED ORDER — FENTANYL CITRATE (PF) 100 MCG/2ML IJ SOLN
INTRAMUSCULAR | Status: AC
  Filled 2017-05-06: qty 2

## 2017-05-06 MED ORDER — HEPARIN SODIUM (PORCINE) 1000 UNIT/ML IJ SOLN
INTRAMUSCULAR | Status: DC | PRN
  Administered 2017-05-06: 6000 [IU] via INTRAVENOUS

## 2017-05-06 MED ORDER — FENTANYL CITRATE (PF) 100 MCG/2ML IJ SOLN
INTRAMUSCULAR | Status: DC | PRN
  Administered 2017-05-06: 50 ug via INTRAVENOUS

## 2017-05-06 MED ORDER — LIDOCAINE HCL (PF) 1 % IJ SOLN
INTRAMUSCULAR | Status: AC
  Filled 2017-05-06: qty 30

## 2017-05-06 MED ORDER — VERAPAMIL HCL 2.5 MG/ML IV SOLN
INTRAVENOUS | Status: DC | PRN
  Administered 2017-05-06: 10 mL via INTRA_ARTERIAL

## 2017-05-06 MED ORDER — SODIUM CHLORIDE 0.9% FLUSH
3.0000 mL | Freq: Two times a day (BID) | INTRAVENOUS | Status: DC
  Administered 2017-05-07 – 2017-05-09 (×3): 3 mL via INTRAVENOUS

## 2017-05-06 MED ORDER — IOPAMIDOL (ISOVUE-370) INJECTION 76%
INTRAVENOUS | Status: AC
  Filled 2017-05-06: qty 100

## 2017-05-06 SURGICAL SUPPLY — 11 items
CATH INFINITI JR4 5F (CATHETERS) ×1 IMPLANT
CATH OPTITORQUE TIG 4.0 5F (CATHETERS) ×1 IMPLANT
DEVICE RAD COMP TR BAND LRG (VASCULAR PRODUCTS) ×1 IMPLANT
ELECT DEFIB PAD ADLT CADENCE (PAD) ×1 IMPLANT
GLIDESHEATH SLEND A-KIT 6F 22G (SHEATH) ×1 IMPLANT
GUIDEWIRE INQWIRE 1.5J.035X260 (WIRE) IMPLANT
INQWIRE 1.5J .035X260CM (WIRE) ×2
KIT HEART LEFT (KITS) ×2 IMPLANT
PACK CARDIAC CATHETERIZATION (CUSTOM PROCEDURE TRAY) ×2 IMPLANT
TRANSDUCER W/STOPCOCK (MISCELLANEOUS) ×2 IMPLANT
TUBING CIL FLEX 10 FLL-RA (TUBING) ×2 IMPLANT

## 2017-05-06 NOTE — H&P (View-Only) (Signed)
Progress Note  Patient Name: Christopher [name] Date of Encounter: 05/06/2017  Primary Cardiologist: Aundra Dubin  Subjective   Feels okay.  Does not remember anything from yesterday at all.  No chest pain or shortness of breath.  Inpatient Medications    Scheduled Meds: . amLODipine  5 mg Oral Daily  . aspirin EC  81 mg Oral Daily  . atorvastatin  20 mg Oral Daily  . cholecalciferol  2,000 Units Oral Daily  . fluticasone  2 spray Each Nare Daily  . folic acid  1 mg Oral Daily  . loratadine  10 mg Oral Daily  . metoprolol succinate  100 mg Oral Daily  . multivitamin with minerals  1 tablet Oral Daily  . potassium chloride  40 mEq Oral Once  . ramipril  10 mg Oral Daily  . sodium chloride flush  3 mL Intravenous Q12H  . tamsulosin  0.4 mg Oral Daily  . thiamine  100 mg Oral Daily   Or  . thiamine  100 mg Intravenous Daily   Continuous Infusions: . sodium chloride 10 mL/hr at 05/05/17 2309   PRN Meds: acetaminophen **OR** acetaminophen, LORazepam **OR** LORazepam, oxyCODONE, prochlorperazine   Vital Signs    Vitals:   05/06/17 0800 05/06/17 0852 05/06/17 0902 05/06/17 1054  BP: (!) 162/87 140/83  (!) 156/86  Pulse: 75 75  74  Resp: (!) 22 18    Temp:   98 F (36.7 C)   TempSrc:   Oral   SpO2: 98% 98%    Weight:      Height:        Intake/Output Summary (Last 24 hours) at 05/06/17 1249 Last data filed at 05/06/17 1246  Gross per 24 hour  Intake              403 ml  Output             1950 ml  Net            -1547 ml   Filed Weights   05/05/17 1459  Weight: 250 lb (113.4 kg)    Telemetry    Sinus rhythm, single PVCs, couplets, rare triplets- Personally Reviewed   Physical Exam  Alert, oriented male, obese GEN: No acute distress.   HEENT: There is a scalp hematoma on the posterior right scalp, improved from yesterday, no active bleeding Neck: No JVD Cardiac: RRR, no murmurs, rubs, or gallops.  Respiratory: Clear to auscultation bilaterally. GI: Soft,  nontender, non-distended  MS: No edema; No deformity. Neuro:  Nonfocal  Psych: Normal affect   Labs    Chemistry Recent Labs Lab 05/05/17 1428 05/06/17 0454  NA 138 136  K 3.3* 3.4*  CL 102 102  CO2 24 24  GLUCOSE 227* 186*  BUN 13 11  CREATININE 0.94 0.87  CALCIUM 8.6* 8.7*  PROT 7.1 6.6  ALBUMIN 4.0 3.7  AST 83* 44*  ALT 71* 56  ALKPHOS 87 62  BILITOT 1.0 1.1  GFRNONAA >60 >60  GFRAA >60 >60  ANIONGAP 12 10     Hematology Recent Labs Lab 05/05/17 1425 05/06/17 0454  WBC 7.4 10.4  RBC 5.21 4.75  HGB 15.7 14.6  HCT 45.8 41.5  MCV 87.9 87.4  MCH 30.1 30.7  MCHC 34.3 35.2  RDW 13.6 13.8  PLT 156 156    Cardiac Enzymes Recent Labs Lab 05/05/17 1428 05/05/17 1819 05/06/17 0044 05/06/17 0454  TROPONINI 0.03* 0.08* 0.08* 0.08*    Recent Labs Lab 05/05/17  1429  TROPIPOC 0.00     BNP Recent Labs Lab 05/05/17 1819  BNP 139.2*     DDimer No results for input(s): DDIMER in the last 168 hours.   Radiology    Ct Head Wo Contrast  Result Date: 05/05/2017 CLINICAL DATA:  Syncope. EXAM: CT HEAD WITHOUT CONTRAST CT CERVICAL SPINE WITHOUT CONTRAST TECHNIQUE: Multidetector CT imaging of the head and cervical spine was performed following the standard protocol without intravenous contrast. Multiplanar CT image reconstructions of the cervical spine were also generated. COMPARISON:  None. FINDINGS: CT HEAD FINDINGS Brain: No evidence of acute infarction, hemorrhage, hydrocephalus, extra-axial collection or mass lesion/mass effect. Mild age related cerebral atrophy. Vascular: No hyperdense vessel or unexpected calcification. Skull: Normal. Negative for fracture or focal lesion. Sinuses/Orbits: Near complete opacification of the left mastoid air cells. No acute orbital abnormality. Other: Moderate right posterior scalp hematoma. CT CERVICAL SPINE FINDINGS Alignment: Straightening of the normal cervical lordosis. No traumatic malalignment. Skull base and vertebrae:  No acute fracture. No primary bone lesion or focal pathologic process. Soft tissues and spinal canal: No prevertebral fluid or swelling. No visible canal hematoma. Disc levels: Mild multilevel disc height loss and endplate spurring from P3-I9 through C7-T1. Scattered facet uncovertebral hypertrophy. Upper chest: Negative. Other: Partially visualized enteric tube. IMPRESSION: 1. No acute intracranial abnormality. Moderate right posterior scalp hematoma. 2. No acute cervical spine fracture. Mild-to-moderate multilevel degenerative changes. 3. Large left mastoid effusion. Electronically Signed   By: Titus Dubin M.D.   On: 05/05/2017 15:09   Ct Cervical Spine Wo Contrast  Result Date: 05/05/2017 CLINICAL DATA:  Syncope. EXAM: CT HEAD WITHOUT CONTRAST CT CERVICAL SPINE WITHOUT CONTRAST TECHNIQUE: Multidetector CT imaging of the head and cervical spine was performed following the standard protocol without intravenous contrast. Multiplanar CT image reconstructions of the cervical spine were also generated. COMPARISON:  None. FINDINGS: CT HEAD FINDINGS Brain: No evidence of acute infarction, hemorrhage, hydrocephalus, extra-axial collection or mass lesion/mass effect. Mild age related cerebral atrophy. Vascular: No hyperdense vessel or unexpected calcification. Skull: Normal. Negative for fracture or focal lesion. Sinuses/Orbits: Near complete opacification of the left mastoid air cells. No acute orbital abnormality. Other: Moderate right posterior scalp hematoma. CT CERVICAL SPINE FINDINGS Alignment: Straightening of the normal cervical lordosis. No traumatic malalignment. Skull base and vertebrae: No acute fracture. No primary bone lesion or focal pathologic process. Soft tissues and spinal canal: No prevertebral fluid or swelling. No visible canal hematoma. Disc levels: Mild multilevel disc height loss and endplate spurring from J1-O8 through C7-T1. Scattered facet uncovertebral hypertrophy. Upper chest:  Negative. Other: Partially visualized enteric tube. IMPRESSION: 1. No acute intracranial abnormality. Moderate right posterior scalp hematoma. 2. No acute cervical spine fracture. Mild-to-moderate multilevel degenerative changes. 3. Large left mastoid effusion. Electronically Signed   By: Titus Dubin M.D.   On: 05/05/2017 15:09   Dg Chest Portable 1 View  Result Date: 05/05/2017 CLINICAL DATA:  Syncope. EXAM: PORTABLE CHEST 1 VIEW COMPARISON:  Chest x-ray dated April 28, 2016. FINDINGS: Cardiomegaly. Pulmonary vascular congestion with increased basal predominant interstitial markings. No consolidation, pleural effusion, or pneumothorax. No acute osseous abnormality. Enteric tube in place with tip and distal side port in the gastric fundus. IMPRESSION: Cardiomegaly with pulmonary vascular congestion and interstitial edema. Electronically Signed   By: Titus Dubin M.D.   On: 05/05/2017 15:19   Dg Abd Portable 1v  Result Date: 05/05/2017 CLINICAL DATA:  Check nasogastric catheter placement EXAM: PORTABLE ABDOMEN - 1 VIEW COMPARISON:  None. FINDINGS:  Nasogastric catheter is noted extending into the stomach and coiling in the fundus. No obstructive changes are noted. Degenerative change of the lumbar spine is seen. IMPRESSION: Nasogastric catheter within stomach. Electronically Signed   By: Inez Catalina M.D.   On: 05/05/2017 15:20    Cardiac Studies   2D echocardiogram is pending  Patient Profile     66 y.o. male with history of coronary disease and mitral valve regurgitation who is status post coronary stenting and mitral valve repair in 2014, now presenting with out of hospital ventricular fibrillation cardiac arrest, defibrillated early in the field with good neurologic recovery  Assessment & Plan    1.  Ventricular fibrillation cardiac arrest: Continues to have PVCs and ventricular couplets on monitoring.  Await 2D echocardiogram.  Need to evaluate for both the cardiomyopathy and  coronary ischemia.  Cardiac catheterization and possible PCI today.  I reviewed the risks, indications, and alternatives to the procedure at length with the patient and his sister who is at the bedside.  His scalp hematoma is stable and I think he is at low risk of using IV heparin for diagnostic radial catheterization.  Will request formal EP consultation after his catheterization is completed.  Continue metoprolol succinate 100 mg daily.  2.  Hypertension: Continue amlodipine, metoprolol succinate, and ramipril  3.  Mitral valve disease: No appreciable murmur on exam.  Check 2D echo.  4.  Obesity: Counseling done  5.  Type 2 diabetes: Hemoglobin A1c is greater than 7.  The patient denies any history of diabetes.  I suspect this is related to his weight gain over the past year.  Will need aggressive lifestyle modification.  6.  Lipids: Will add a lipid panel.  Disposition: Cardiac catheterization today.  Await 2D echo.  Consult EP service for ventricular fibrillation cardiac arrest and consideration of ICD implantation.  For questions or updates, please contact [place] Please consult [email or web address] for contact info under Cardiology/STEMI.      Deatra Shaine, MD  05/06/2017, 12:49 PM

## 2017-05-06 NOTE — ED Notes (Signed)
Report given to [facility] 2C.

## 2017-05-06 NOTE — ED Notes (Signed)
Report attempted Number left with secretary.

## 2017-05-06 NOTE — Progress Notes (Signed)
PROGRESS NOTE    Christopher [name]  DXA:[id number] DOB: Jul 29, 1950 DOA: 05/05/2017 PCP: Hoyt Koch, MD  Outpatient Specialists:     Brief Narrative:  Patient is a 66 y.o. 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  Patient with reported history of weight gain of 50 pounds over the past 4 years since he retired in 2014.  It was reported to the cardiology staff that patient drank alcohol daily but both girlfriend and patient stated patient only drank 2 beers per episode of ingestion.  Patient was at [employer] when he had sudden collapse.  An AED was applied that demonstrated ventricular fibrillation.  He underwent CPR and defibrillation x 2. Patient was said to be out for only 2 minutes.   Cardiology is assisting with patient's care. Patient is said to have undergone cardiac catheterization. Further cardiac work up is planned.  Assessment & Plan:   Principal Problem:   Altered mental status Active Problems:   Cardiac arrest with ventricular fibrillation (HCC)   Alcohol use   Acute hypokalemia   CAD (coronary artery disease)   Hypertension   OSA (obstructive sleep apnea)   History of repair of mitral valve   Scalp hematoma   Cardiac arrest with ventricular fibrillation - Cardiac work up in progress - Neurologically, seems intact - Further work up will depend on above, including AICD placement option. - Follow ECHO.   CAD (coronary artery disease) - See above.  Scalp hematoma -Sustained after collapsing in the context of ventricular fibrillation arrest -Tylenol for mild pain -Low-dose oxycodone for more significant pain  Possible Alcohol  use -Inconsistent history obtained by 2 different providers regarding patient's alcohol use; cardiology reports daily use but patient and girlfriend state periodic use and never more than 2 beers per occasion -Alcohol level -UDS -CIWA with Ativan prn  Acute hypokalemia -Replete potassium  -Follow   Hypertension -Currently not well controlled - Optimize  OSA (obstructive sleep apnea) -Nocturnal CPAP - OSA is a known risk factor for sudden cardiac death.  Weight gain - Counsel need to loose weight - Life style changes/modification.  History of repair of mitral valve  DVT prophylaxis: SCDs-if remains greater than 48 hours consider adding Lovenox Code Status: Full Family Communication: Girlfriend Disposition Plan: Home Consults called: Cardiology/[place]   Subjective: Nil complaints Awake and alert No SOB or chest pain  Objective: Vitals:   05/06/17 1356 05/06/17 1414 05/06/17 1415 05/06/17 1642  BP: 139/83  (!) 155/81   Pulse: 65 67    Resp: 16 20 17    Temp:  98.6 F (37 C)  98.6 F (37 C)  TempSrc:  Oral  Oral  SpO2: 99% 98% 97%   Weight:      Height:        Intake/Output Summary (Last 24 hours) at 05/06/17 1757 Last data filed at 05/06/17 1246  Gross per 24 hour  Intake              403 ml  Output             1500 ml  Net            -1097 ml   Filed Weights   05/05/17 1459  Weight: 113.4 kg (250 lb)  Examination:  General exam: Obese. Not in any distress. Awake and alert.  Respiratory system: Clear to auscultation.   Cardiovascular system: S1 & S2 heard,   Gastrointestinal system: Abdomen is obese, soft and nontender.    Central nervous system: Alert and oriented. No focal neurological deficits. Extremities: Symmetric 5 x 5 power.   Data Reviewed: I have personally reviewed following labs and imaging studies  CBC:  Recent Labs Lab 05/05/17 1425 05/06/17 0454  WBC 7.4 10.4  HGB 15.7 14.6  HCT 45.8 41.5  MCV 87.9 87.4  PLT 156  762   Basic Metabolic Panel:  Recent Labs Lab 05/05/17 1428 05/06/17 0454  NA 138 136  K 3.3* 3.4*  CL 102 102  CO2 24 24  GLUCOSE 227* 186*  BUN 13 11  CREATININE 0.94 0.87  CALCIUM 8.6* 8.7*  MG 2.2  --   PHOS 3.0  --    GFR: Estimated Creatinine Clearance: 104 mL/min (by C-G formula based on SCr of 0.87 mg/dL). Liver Function Tests:  Recent Labs Lab 05/05/17 1428 05/06/17 0454  AST 83* 44*  ALT 71* 56  ALKPHOS 87 62  BILITOT 1.0 1.1  PROT 7.1 6.6  ALBUMIN 4.0 3.7   No results for input(s): LIPASE, AMYLASE in the last 168 hours. No results for input(s): AMMONIA in the last 168 hours. Coagulation Profile: No results for input(s): INR, PROTIME in the last 168 hours. Cardiac Enzymes:  Recent Labs Lab 05/05/17 1428 05/05/17 1819 05/06/17 0044 05/06/17 0454  TROPONINI 0.03* 0.08* 0.08* 0.08*   BNP (last 3 results) No results for input(s): PROBNP in the last 8760 hours. HbA1C:  Recent Labs  05/05/17 1819  HGBA1C 7.3*   CBG: No results for input(s): GLUCAP in the last 168 hours. Lipid Profile: No results for input(s): CHOL, HDL, LDLCALC, TRIG, CHOLHDL, LDLDIRECT in the last 72 hours. Thyroid Function Tests:  Recent Labs  05/05/17 1629  TSH 1.966   Anemia Panel: No results for input(s): VITAMINB12, FOLATE, FERRITIN, TIBC, IRON, RETICCTPCT in the last 72 hours. Urine analysis:    Component Value Date/Time   COLORURINE YELLOW 09/23/2012 1515   APPEARANCEUR HAZY (A) 09/23/2012 1515   LABSPEC 1.019 09/23/2012 1515   PHURINE 7.0 09/23/2012 1515   GLUCOSEU NEGATIVE 09/23/2012 1515   HGBUR NEGATIVE 09/23/2012 1515   BILIRUBINUR small 05/03/2013 0845   KETONESUR NEGATIVE 09/23/2012 1515   PROTEINUR trace 05/03/2013 0845   PROTEINUR NEGATIVE 09/23/2012 1515   UROBILINOGEN 0.2 05/03/2013 0845   UROBILINOGEN 1.0 09/23/2012 1515   NITRITE neg 05/03/2013 0845   NITRITE NEGATIVE 09/23/2012 1515   LEUKOCYTESUR Negative 05/03/2013 0845   Sepsis  Labs: @LABRCNTIP (procalcitonin:4,lacticidven:4)  ) Recent Results (from the past 240 hour(s))  MRSA PCR Screening     Status: None   Collection Time: 05/06/17 10:10 AM  Result Value Ref Range Status   MRSA by PCR NEGATIVE NEGATIVE Final    Comment:        The GeneXpert MRSA Assay (FDA approved for NASAL specimens only), is one component of a comprehensive MRSA colonization surveillance program. It is not intended to diagnose MRSA infection nor to guide or monitor treatment for MRSA infections.          Radiology Studies: Ct Head Wo Contrast  Result Date: 05/05/2017 CLINICAL DATA:  Syncope. EXAM: CT HEAD WITHOUT CONTRAST CT CERVICAL SPINE WITHOUT CONTRAST TECHNIQUE: Multidetector CT imaging of the head and cervical spine was performed following the standard protocol without intravenous contrast. Multiplanar CT image reconstructions  of the cervical spine were also generated. COMPARISON:  None. FINDINGS: CT HEAD FINDINGS Brain: No evidence of acute infarction, hemorrhage, hydrocephalus, extra-axial collection or mass lesion/mass effect. Mild age related cerebral atrophy. Vascular: No hyperdense vessel or unexpected calcification. Skull: Normal. Negative for fracture or focal lesion. Sinuses/Orbits: Near complete opacification of the left mastoid air cells. No acute orbital abnormality. Other: Moderate right posterior scalp hematoma. CT CERVICAL SPINE FINDINGS Alignment: Straightening of the normal cervical lordosis. No traumatic malalignment. Skull base and vertebrae: No acute fracture. No primary bone lesion or focal pathologic process. Soft tissues and spinal canal: No prevertebral fluid or swelling. No visible canal hematoma. Disc levels: Mild multilevel disc height loss and endplate spurring from G3-O7 through C7-T1. Scattered facet uncovertebral hypertrophy. Upper chest: Negative. Other: Partially visualized enteric tube. IMPRESSION: 1. No acute intracranial abnormality. Moderate  right posterior scalp hematoma. 2. No acute cervical spine fracture. Mild-to-moderate multilevel degenerative changes. 3. Large left mastoid effusion. Electronically Signed   By: Titus Dubin M.D.   On: 05/05/2017 15:09   Ct Cervical Spine Wo Contrast  Result Date: 05/05/2017 CLINICAL DATA:  Syncope. EXAM: CT HEAD WITHOUT CONTRAST CT CERVICAL SPINE WITHOUT CONTRAST TECHNIQUE: Multidetector CT imaging of the head and cervical spine was performed following the standard protocol without intravenous contrast. Multiplanar CT image reconstructions of the cervical spine were also generated. COMPARISON:  None. FINDINGS: CT HEAD FINDINGS Brain: No evidence of acute infarction, hemorrhage, hydrocephalus, extra-axial collection or mass lesion/mass effect. Mild age related cerebral atrophy. Vascular: No hyperdense vessel or unexpected calcification. Skull: Normal. Negative for fracture or focal lesion. Sinuses/Orbits: Near complete opacification of the left mastoid air cells. No acute orbital abnormality. Other: Moderate right posterior scalp hematoma. CT CERVICAL SPINE FINDINGS Alignment: Straightening of the normal cervical lordosis. No traumatic malalignment. Skull base and vertebrae: No acute fracture. No primary bone lesion or focal pathologic process. Soft tissues and spinal canal: No prevertebral fluid or swelling. No visible canal hematoma. Disc levels: Mild multilevel disc height loss and endplate spurring from F6-E3 through C7-T1. Scattered facet uncovertebral hypertrophy. Upper chest: Negative. Other: Partially visualized enteric tube. IMPRESSION: 1. No acute intracranial abnormality. Moderate right posterior scalp hematoma. 2. No acute cervical spine fracture. Mild-to-moderate multilevel degenerative changes. 3. Large left mastoid effusion. Electronically Signed   By: Titus Dubin M.D.   On: 05/05/2017 15:09   Dg Chest Portable 1 View  Result Date: 05/05/2017 CLINICAL DATA:  Syncope. EXAM: PORTABLE  CHEST 1 VIEW COMPARISON:  Chest x-ray dated April 28, 2016. FINDINGS: Cardiomegaly. Pulmonary vascular congestion with increased basal predominant interstitial markings. No consolidation, pleural effusion, or pneumothorax. No acute osseous abnormality. Enteric tube in place with tip and distal side port in the gastric fundus. IMPRESSION: Cardiomegaly with pulmonary vascular congestion and interstitial edema. Electronically Signed   By: Titus Dubin M.D.   On: 05/05/2017 15:19   Dg Abd Portable 1v  Result Date: 05/05/2017 CLINICAL DATA:  Check nasogastric catheter placement EXAM: PORTABLE ABDOMEN - 1 VIEW COMPARISON:  None. FINDINGS: Nasogastric catheter is noted extending into the stomach and coiling in the fundus. No obstructive changes are noted. Degenerative change of the lumbar spine is seen. IMPRESSION: Nasogastric catheter within stomach. Electronically Signed   By: Inez Catalina M.D.   On: 05/05/2017 15:20        Scheduled Meds: . amLODipine  5 mg Oral Daily  . aspirin EC  81 mg Oral Daily  . atorvastatin  20 mg Oral Daily  . cholecalciferol  2,000  Units Oral Daily  . fluticasone  2 spray Each Nare Daily  . folic acid  1 mg Oral Daily  . heparin  5,000 Units Subcutaneous Q8H  . loratadine  10 mg Oral Daily  . metoprolol succinate  100 mg Oral Daily  . multivitamin with minerals  1 tablet Oral Daily  . ramipril  10 mg Oral Daily  . sodium chloride flush  3 mL Intravenous Q12H  . sodium chloride flush  3 mL Intravenous Q12H  . tamsulosin  0.4 mg Oral Daily  . thiamine  100 mg Oral Daily   Or  . thiamine  100 mg Intravenous Daily   Continuous Infusions: . sodium chloride 10 mL/hr at 05/05/17 2309  . sodium chloride 100 mL/hr at 05/06/17 1607  . sodium chloride       LOS: 1 day    Time spent: 24 Minutes    Dana Allan, MD  [facility] Pager #: [phone] 7PM-7AM contact night coverage as above

## 2017-05-06 NOTE — Progress Notes (Signed)
Progress Note  Patient Name: Christopher [name] Date of Encounter: 05/06/2017  Primary Cardiologist: Aundra Dubin  Subjective   Feels okay.  Does not remember anything from yesterday at all.  No chest pain or shortness of breath.  Inpatient Medications    Scheduled Meds: . amLODipine  5 mg Oral Daily  . aspirin EC  81 mg Oral Daily  . atorvastatin  20 mg Oral Daily  . cholecalciferol  2,000 Units Oral Daily  . fluticasone  2 spray Each Nare Daily  . folic acid  1 mg Oral Daily  . loratadine  10 mg Oral Daily  . metoprolol succinate  100 mg Oral Daily  . multivitamin with minerals  1 tablet Oral Daily  . potassium chloride  40 mEq Oral Once  . ramipril  10 mg Oral Daily  . sodium chloride flush  3 mL Intravenous Q12H  . tamsulosin  0.4 mg Oral Daily  . thiamine  100 mg Oral Daily   Or  . thiamine  100 mg Intravenous Daily   Continuous Infusions: . sodium chloride 10 mL/hr at 05/05/17 2309   PRN Meds: acetaminophen **OR** acetaminophen, LORazepam **OR** LORazepam, oxyCODONE, prochlorperazine   Vital Signs    Vitals:   05/06/17 0800 05/06/17 0852 05/06/17 0902 05/06/17 1054  BP: (!) 162/87 140/83  (!) 156/86  Pulse: 75 75  74  Resp: (!) 22 18    Temp:   98 F (36.7 C)   TempSrc:   Oral   SpO2: 98% 98%    Weight:      Height:        Intake/Output Summary (Last 24 hours) at 05/06/17 1249 Last data filed at 05/06/17 1246  Gross per 24 hour  Intake              403 ml  Output             1950 ml  Net            -1547 ml   Filed Weights   05/05/17 1459  Weight: 250 lb (113.4 kg)    Telemetry    Sinus rhythm, single PVCs, couplets, rare triplets- Personally Reviewed   Physical Exam  Alert, oriented male, obese GEN: No acute distress.   HEENT: There is a scalp hematoma on the posterior right scalp, improved from yesterday, no active bleeding Neck: No JVD Cardiac: RRR, no murmurs, rubs, or gallops.  Respiratory: Clear to auscultation bilaterally. GI: Soft,  nontender, non-distended  MS: No edema; No deformity. Neuro:  Nonfocal  Psych: Normal affect   Labs    Chemistry Recent Labs Lab 05/05/17 1428 05/06/17 0454  NA 138 136  K 3.3* 3.4*  CL 102 102  CO2 24 24  GLUCOSE 227* 186*  BUN 13 11  CREATININE 0.94 0.87  CALCIUM 8.6* 8.7*  PROT 7.1 6.6  ALBUMIN 4.0 3.7  AST 83* 44*  ALT 71* 56  ALKPHOS 87 62  BILITOT 1.0 1.1  GFRNONAA >60 >60  GFRAA >60 >60  ANIONGAP 12 10     Hematology Recent Labs Lab 05/05/17 1425 05/06/17 0454  WBC 7.4 10.4  RBC 5.21 4.75  HGB 15.7 14.6  HCT 45.8 41.5  MCV 87.9 87.4  MCH 30.1 30.7  MCHC 34.3 35.2  RDW 13.6 13.8  PLT 156 156    Cardiac Enzymes Recent Labs Lab 05/05/17 1428 05/05/17 1819 05/06/17 0044 05/06/17 0454  TROPONINI 0.03* 0.08* 0.08* 0.08*    Recent Labs Lab 05/05/17  1429  TROPIPOC 0.00     BNP Recent Labs Lab 05/05/17 1819  BNP 139.2*     DDimer No results for input(s): DDIMER in the last 168 hours.   Radiology    Ct Head Wo Contrast  Result Date: 05/05/2017 CLINICAL DATA:  Syncope. EXAM: CT HEAD WITHOUT CONTRAST CT CERVICAL SPINE WITHOUT CONTRAST TECHNIQUE: Multidetector CT imaging of the head and cervical spine was performed following the standard protocol without intravenous contrast. Multiplanar CT image reconstructions of the cervical spine were also generated. COMPARISON:  None. FINDINGS: CT HEAD FINDINGS Brain: No evidence of acute infarction, hemorrhage, hydrocephalus, extra-axial collection or mass lesion/mass effect. Mild age related cerebral atrophy. Vascular: No hyperdense vessel or unexpected calcification. Skull: Normal. Negative for fracture or focal lesion. Sinuses/Orbits: Near complete opacification of the left mastoid air cells. No acute orbital abnormality. Other: Moderate right posterior scalp hematoma. CT CERVICAL SPINE FINDINGS Alignment: Straightening of the normal cervical lordosis. No traumatic malalignment. Skull base and vertebrae:  No acute fracture. No primary bone lesion or focal pathologic process. Soft tissues and spinal canal: No prevertebral fluid or swelling. No visible canal hematoma. Disc levels: Mild multilevel disc height loss and endplate spurring from Q5-Z5 through C7-T1. Scattered facet uncovertebral hypertrophy. Upper chest: Negative. Other: Partially visualized enteric tube. IMPRESSION: 1. No acute intracranial abnormality. Moderate right posterior scalp hematoma. 2. No acute cervical spine fracture. Mild-to-moderate multilevel degenerative changes. 3. Large left mastoid effusion. Electronically Signed   By: Titus Dubin M.D.   On: 05/05/2017 15:09   Ct Cervical Spine Wo Contrast  Result Date: 05/05/2017 CLINICAL DATA:  Syncope. EXAM: CT HEAD WITHOUT CONTRAST CT CERVICAL SPINE WITHOUT CONTRAST TECHNIQUE: Multidetector CT imaging of the head and cervical spine was performed following the standard protocol without intravenous contrast. Multiplanar CT image reconstructions of the cervical spine were also generated. COMPARISON:  None. FINDINGS: CT HEAD FINDINGS Brain: No evidence of acute infarction, hemorrhage, hydrocephalus, extra-axial collection or mass lesion/mass effect. Mild age related cerebral atrophy. Vascular: No hyperdense vessel or unexpected calcification. Skull: Normal. Negative for fracture or focal lesion. Sinuses/Orbits: Near complete opacification of the left mastoid air cells. No acute orbital abnormality. Other: Moderate right posterior scalp hematoma. CT CERVICAL SPINE FINDINGS Alignment: Straightening of the normal cervical lordosis. No traumatic malalignment. Skull base and vertebrae: No acute fracture. No primary bone lesion or focal pathologic process. Soft tissues and spinal canal: No prevertebral fluid or swelling. No visible canal hematoma. Disc levels: Mild multilevel disc height loss and endplate spurring from G3-O7 through C7-T1. Scattered facet uncovertebral hypertrophy. Upper chest:  Negative. Other: Partially visualized enteric tube. IMPRESSION: 1. No acute intracranial abnormality. Moderate right posterior scalp hematoma. 2. No acute cervical spine fracture. Mild-to-moderate multilevel degenerative changes. 3. Large left mastoid effusion. Electronically Signed   By: Titus Dubin M.D.   On: 05/05/2017 15:09   Dg Chest Portable 1 View  Result Date: 05/05/2017 CLINICAL DATA:  Syncope. EXAM: PORTABLE CHEST 1 VIEW COMPARISON:  Chest x-ray dated April 28, 2016. FINDINGS: Cardiomegaly. Pulmonary vascular congestion with increased basal predominant interstitial markings. No consolidation, pleural effusion, or pneumothorax. No acute osseous abnormality. Enteric tube in place with tip and distal side port in the gastric fundus. IMPRESSION: Cardiomegaly with pulmonary vascular congestion and interstitial edema. Electronically Signed   By: Titus Dubin M.D.   On: 05/05/2017 15:19   Dg Abd Portable 1v  Result Date: 05/05/2017 CLINICAL DATA:  Check nasogastric catheter placement EXAM: PORTABLE ABDOMEN - 1 VIEW COMPARISON:  None. FINDINGS:  Nasogastric catheter is noted extending into the stomach and coiling in the fundus. No obstructive changes are noted. Degenerative change of the lumbar spine is seen. IMPRESSION: Nasogastric catheter within stomach. Electronically Signed   By: Inez Catalina M.D.   On: 05/05/2017 15:20    Cardiac Studies   2D echocardiogram is pending  Patient Profile     66 y.o. male with history of coronary disease and mitral valve regurgitation who is status post coronary stenting and mitral valve repair in 2014, now presenting with out of hospital ventricular fibrillation cardiac arrest, defibrillated early in the field with good neurologic recovery  Assessment & Plan    1.  Ventricular fibrillation cardiac arrest: Continues to have PVCs and ventricular couplets on monitoring.  Await 2D echocardiogram.  Need to evaluate for both the cardiomyopathy and  coronary ischemia.  Cardiac catheterization and possible PCI today.  I reviewed the risks, indications, and alternatives to the procedure at length with the patient and his sister who is at the bedside.  His scalp hematoma is stable and I think he is at low risk of using IV heparin for diagnostic radial catheterization.  Will request formal EP consultation after his catheterization is completed.  Continue metoprolol succinate 100 mg daily.  2.  Hypertension: Continue amlodipine, metoprolol succinate, and ramipril  3.  Mitral valve disease: No appreciable murmur on exam.  Check 2D echo.  4.  Obesity: Counseling done  5.  Type 2 diabetes: Hemoglobin A1c is greater than 7.  The patient denies any history of diabetes.  I suspect this is related to his weight gain over the past year.  Will need aggressive lifestyle modification.  6.  Lipids: Will add a lipid panel.  Disposition: Cardiac catheterization today.  Await 2D echo.  Consult EP service for ventricular fibrillation cardiac arrest and consideration of ICD implantation.  For questions or updates, please contact [facility] Please consult [email or web address] for contact info under Cardiology/STEMI.      Deatra Zevin, MD  05/06/2017, 12:49 PM

## 2017-05-06 NOTE — Interval H&P Note (Signed)
History and Physical Interval Note:  05/06/2017 1:15 PM  Christopher [name]  has presented today for surgery, with the diagnosis of cardiac arrest with VFib, history of known CAD.  The various methods of treatment have been discussed with the patient and family. After consideration of risks, benefits and other options for treatment, the patient has consented to  Procedure(s): LEFT HEART CATH AND CORONARY ANGIOGRAPHY (N/A) with possible PERCUTANEOUS CORONARY INTERVENTION as a surgical intervention .  The patient's history has been reviewed, patient examined, no change in status, stable for surgery.  I have reviewed the patient's chart and labs.  Questions were answered to the patient's satisfaction.    Cath Lab Visit (complete for each Cath Lab visit)  Clinical Evaluation Leading to the Procedure:   ACS: Yes.    Non-ACS:    Anginal Classification: CCS IV  Anti-ischemic medical therapy: Maximal Therapy (2 or more classes of medications)  Non-Invasive Test Results: No non-invasive testing performed  Prior CABG: No previous CABG   Glenetta Hew

## 2017-05-07 ENCOUNTER — Inpatient Hospital Stay (HOSPITAL_COMMUNITY): Payer: Medicare Other

## 2017-05-07 ENCOUNTER — Encounter (HOSPITAL_COMMUNITY): Payer: Self-pay | Admitting: Cardiology

## 2017-05-07 ENCOUNTER — Encounter (HOSPITAL_COMMUNITY)
Admission: EM | Disposition: A | Discharge status: Home Health | Payer: Self-pay | Source: Home / Self Care | Attending: Internal Medicine

## 2017-05-07 DIAGNOSIS — S0003XA Contusion of scalp, initial encounter: Secondary | ICD-10-CM

## 2017-05-07 DIAGNOSIS — I469 Cardiac arrest, cause unspecified: Secondary | ICD-10-CM

## 2017-05-07 DIAGNOSIS — G4733 Obstructive sleep apnea (adult) (pediatric): Secondary | ICD-10-CM

## 2017-05-07 DIAGNOSIS — I10 Essential (primary) hypertension: Secondary | ICD-10-CM

## 2017-05-07 DIAGNOSIS — I509 Heart failure, unspecified: Secondary | ICD-10-CM

## 2017-05-07 DIAGNOSIS — I4901 Ventricular fibrillation: Principal | ICD-10-CM

## 2017-05-07 HISTORY — PX: ICD IMPLANT: EP1208

## 2017-05-07 LAB — BASIC METABOLIC PANEL
Anion gap: 7 (ref 5–15)
BUN: 11 mg/dL (ref 6–20)
CO2: 25 mmol/L (ref 22–32)
Calcium: 8.3 mg/dL — ABNORMAL LOW (ref 8.9–10.3)
Chloride: 105 mmol/L (ref 101–111)
Creatinine, Ser: 0.86 mg/dL (ref 0.61–1.24)
GFR calc Af Amer: >60 mL/min (ref >60)
GFR calc non Af Amer: >60 mL/min (ref >60)
Glucose, Bld: 175 mg/dL — ABNORMAL HIGH (ref 65–99)
Potassium: 3.5 mmol/L (ref 3.5–5.1)
Sodium: 137 mmol/L (ref 135–145)

## 2017-05-07 LAB — ECHOCARDIOGRAM COMPLETE
Height: 70 in
Weight: 4054.4 oz

## 2017-05-07 SURGERY — ICD IMPLANT

## 2017-05-07 MED ORDER — FENTANYL CITRATE (PF) 100 MCG/2ML IJ SOLN
INTRAMUSCULAR | Status: AC
  Filled 2017-05-07: qty 2

## 2017-05-07 MED ORDER — FENTANYL CITRATE (PF) 100 MCG/2ML IJ SOLN
INTRAMUSCULAR | Status: DC | PRN
Start: 2017-05-07 — End: 2017-05-07
  Administered 2017-05-07: 25 ug via INTRAVENOUS

## 2017-05-07 MED ORDER — BUPIVACAINE HCL (PF) 0.25 % IJ SOLN
INTRAMUSCULAR | Status: DC | PRN
  Administered 2017-05-07: 45 mL

## 2017-05-07 MED ORDER — CEFAZOLIN SODIUM-DEXTROSE 2-4 GM/100ML-% IV SOLN
INTRAVENOUS | Status: AC
  Filled 2017-05-07: qty 100

## 2017-05-07 MED ORDER — MIDAZOLAM HCL 5 MG/5ML IJ SOLN
INTRAMUSCULAR | Status: DC | PRN
  Administered 2017-05-07: 2 mg via INTRAVENOUS

## 2017-05-07 MED ORDER — BUPIVACAINE HCL (PF) 0.25 % IJ SOLN
INTRAMUSCULAR | Status: AC
  Filled 2017-05-07: qty 60

## 2017-05-07 MED ORDER — HEPARIN (PORCINE) IN NACL 2-0.9 UNIT/ML-% IJ SOLN
INTRAMUSCULAR | Status: AC
  Filled 2017-05-07: qty 500

## 2017-05-07 MED ORDER — HEPARIN (PORCINE) IN NACL 2-0.9 UNIT/ML-% IJ SOLN
INTRAMUSCULAR | Status: AC | PRN
  Administered 2017-05-07: 500 mL

## 2017-05-07 MED ORDER — SODIUM CHLORIDE 0.9 % IV SOLN: INTRAVENOUS | Status: DC

## 2017-05-07 MED ORDER — CHLORHEXIDINE GLUCONATE 4 % EX LIQD
60.0000 mL | Freq: Once | CUTANEOUS | Status: AC
  Administered 2017-05-07: 4 via TOPICAL

## 2017-05-07 MED ORDER — ACETAMINOPHEN 325 MG PO TABS
325.0000 mg | ORAL_TABLET | ORAL | Status: DC | PRN
  Administered 2017-05-08: 650 mg via ORAL
  Filled 2017-05-07: qty 2

## 2017-05-07 MED ORDER — SODIUM CHLORIDE 0.9 % IR SOLN
80.0000 mg | Status: AC
  Administered 2017-05-07: 80 mg
  Filled 2017-05-07: qty 2

## 2017-05-07 MED ORDER — CEFAZOLIN SODIUM-DEXTROSE 1-4 GM/50ML-% IV SOLN
1.0000 g | Freq: Four times a day (QID) | INTRAVENOUS | Status: AC
  Administered 2017-05-08 (×2): 1 g via INTRAVENOUS
  Filled 2017-05-07 (×4): qty 50

## 2017-05-07 MED ORDER — ONDANSETRON HCL 4 MG/2ML IJ SOLN: 4.0000 mg | Freq: Four times a day (QID) | INTRAMUSCULAR | Status: DC | PRN

## 2017-05-07 MED ORDER — CHLORHEXIDINE GLUCONATE 4 % EX LIQD
60.0000 mL | Freq: Once | CUTANEOUS | Status: AC
  Administered 2017-05-07: 4 via TOPICAL
  Filled 2017-05-07: qty 15

## 2017-05-07 MED ORDER — CEFAZOLIN SODIUM-DEXTROSE 2-4 GM/100ML-% IV SOLN
2.0000 g | INTRAVENOUS | Status: AC
  Administered 2017-05-07: 2 g via INTRAVENOUS
  Filled 2017-05-07: qty 100

## 2017-05-07 MED ORDER — GENTAMICIN SULFATE 40 MG/ML IJ SOLN
INTRAMUSCULAR | Status: AC
  Filled 2017-05-07: qty 2

## 2017-05-07 MED ORDER — MIDAZOLAM HCL 5 MG/5ML IJ SOLN
INTRAMUSCULAR | Status: AC
  Filled 2017-05-07: qty 5

## 2017-05-07 SURGICAL SUPPLY — 7 items
CABLE SURGICAL S-101-97-12 (CABLE) ×1 IMPLANT
ICD VISIA MRI VR DVFB1D4 (ICD Generator) IMPLANT
LEAD SPRINT QUAT SEC 6935M-62 (Lead) ×1 IMPLANT
PAD DEFIB LIFELINK (PAD) ×1 IMPLANT
SHEATH CLASSIC 9F (SHEATH) ×1 IMPLANT
TRAY PACEMAKER INSERTION (PACKS) ×1 IMPLANT
VISIA MRI VR DVFB1D4 (ICD Generator) ×2 IMPLANT

## 2017-05-07 NOTE — Progress Notes (Signed)
Cardiac catheterization images and notes reviewed. 2D echocardiogram has been completed but formal interpretation is pending.  Appreciate evaluation by the electrophysiology service.  Patient for ICD implantation today.  He is clinically stable. His questions are answered. He is upset about 6 month driving restriction but understands medical reason for this.   Sherren Mocha 05/07/2017 12:01 PM

## 2017-05-07 NOTE — Care Management Note (Signed)
Case Management Note  Patient Details  Name: MATTHEW CINA MRN: [id number] Date of Birth: [birth year]  Subjective/Objective:    Pt admitted post V fib arrest - now s/p cardiac arrest               Action/Plan:   PTA from home independent.  CSW consulted for current substance abuse   Expected Discharge Date:                  Expected Discharge Plan:     In-House Referral:  Clinical Social Work  Discharge planning Services  CM Consult  Post Acute Care Choice:    Choice offered to:     DME Arranged:    DME Agency:     HH Arranged:    HH Agency:     Status of Service:     If discussed at [employer], dates discussed:    Additional Comments:  Maryclare Labrador, RN 05/07/2017, 2:42 PM

## 2017-05-07 NOTE — Progress Notes (Signed)
ICD Criteria  Current LVEF:35-45%. Within 12 months prior to implant: Yes   Heart failure history: Yes, Class II  Cardiomyopathy history: Yes, Non-Ischemic Cardiomyopathy.  Atrial Fibrillation/Atrial Flutter: No.  Ventricular tachycardia history: No.  Cardiac arrest history: Yes, Ventricular Fibrillation.  History of syndromes with risk of sudden death: No.  Previous ICD: No.  Current ICD indication: Primary  PPM indication: No.   Class I or II Bradycardia indication present: No  Beta Blocker therapy for 3 or more months: Yes, prescribed.   Ace Inhibitor/ARB therapy for 3 or more months: Yes, prescribed.
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
## 2017-05-07 NOTE — Progress Notes (Signed)
  Echocardiogram 2D Echocardiogram has been performed.  Christopher [name] F 05/07/2017, 11:17 AM

## 2017-05-07 NOTE — Progress Notes (Signed)
PROGRESS NOTE  Christopher [name]:[id number] DOB: [birth year] DOA: 05/05/2017 PCP: Hoyt Koch, MD  HPI/Recap of past 92 hours: 66 year old male with a history significant for hypertension, paroxysmal A. 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  Patient had an episode of syncope at a grocery store and was found to be in V. fib, underwent CPR and defibrillation x2.  Regained consciousness after about 2 minutes.  Patient admitted for post care cardiac arrest.  History of ??  Alcohol abuse.  History of recent weight gain  Today, patient denies any chest pain, worsening shortness of breath, diaphoresis, abdominal pain, nausea/vomiting, fever/chills.  Patient reports dizziness especially when trying to get out of bed, otherwise comfortable  Assessment/Plan: Principal Problem:   Altered mental status Active Problems:   Cardiac arrest with ventricular fibrillation (HCC)   Alcohol use   Acute hypokalemia   CAD (coronary artery disease)   Hypertension   OSA (obstructive sleep apnea)   History of repair of mitral valve   Scalp hematoma  #Cardiac arrest due to ventricular fibrillation Recovered after 2 rounds of defibrillation, neurologically intact Chest pain-free, asymptomatic Unclear etiology, no reversible cause found Echo shows LVEF of 40-45%, diffuse hypokinesis and evidence of mitral valve repair in place Cardiac cath shows nonobstructive CAD Cardiology following EP on board, recommend ICD placement today Continue metoprolol 100 mg daily Monitor closely  #Hypertension Stable Continue amlodipine metoprolol ramipril  #Type 2 diabetes A1c is greater than 7 No official diagnosis of diabetes, likely due to weight gain over the past year RBG noted to be around 100 Outpatient  follow-up with PCP  #Scalp hematoma Status post syncope Pain management  #??  Alcohol abuse Unclear history Urine drug screen negative CIWA, Ativan as needed  #Obstructive sleep apnea CPAP     Code Status: Full  Family Communication: None at bedside  Disposition Plan: Home once stable   Consultants:  Cardiology  EP  Procedures:  Cardiac cath done on 11/2  Antimicrobials:  None  DVT prophylaxis: SCDs for now due to procedures   Objective: Vitals:   05/07/17 0500 05/07/17 0530 05/07/17 0751 05/07/17 1206  BP:  (!) 157/89 (!) 144/104 (!) 169/107  Pulse:  60 70 71  Resp:  18 20 19   Temp:  98.6 F (37 C) 98 F (36.7 C) 98.8 F (37.1 C)  TempSrc:  Oral Oral Oral  SpO2:  96% 94% 97%  Weight: 114.9 kg (253 lb 6.4 oz)     Height:        Intake/Output Summary (Last 24 hours) at 05/07/17 1526 Last data filed at 05/07/17 0400  Gross per 24 hour  Intake            753.5 ml  Output                0 ml  Net            753.5 ml   Filed Weights   05/05/17 1459 05/07/17 0500  Weight: 113.4 kg (250 lb) 114.9 kg (253 lb 6.4 oz)    Exam:   General: Awake, alert, oriented x3, neurologically intact  Cardiovascular: + S1-S2 present no added heart sounds  Respiratory: Chest clear bilaterally  Abdomen: Obese, soft, nontender  Musculoskeletal: No bilateral pedal edema  Skin: Normal  Psychiatry: Normal mood  Data Reviewed: CBC:  Recent Labs Lab 05/05/17 1425 05/06/17 0454  WBC 7.4 10.4  HGB 15.7 14.6  HCT 45.8 41.5  MCV 87.9 87.4  PLT 156 517   Basic Metabolic Panel:  Recent Labs Lab 05/05/17 1428 05/06/17 0454 05/07/17 0825  NA 138 136 137  K 3.3* 3.4* 3.5  CL 102 102 105  CO2 24 24 25   GLUCOSE 227* 186* 175*  BUN 13 11 11   CREATININE 0.94 0.87 0.86  CALCIUM 8.6* 8.7* 8.3*  MG 2.2  --   --   PHOS 3.0  --   --    GFR: Estimated Creatinine Clearance: 105.9 mL/min (by C-G formula based on SCr of 0.86 mg/dL). Liver Function  Tests:  Recent Labs Lab 05/05/17 1428 05/06/17 0454  AST 83* 44*  ALT 71* 56  ALKPHOS 87 62  BILITOT 1.0 1.1  PROT 7.1 6.6  ALBUMIN 4.0 3.7   No results for input(s): LIPASE, AMYLASE in the last 168 hours. No results for input(s): AMMONIA in the last 168 hours. Coagulation Profile: No results for input(s): INR, PROTIME in the last 168 hours. Cardiac Enzymes:  Recent Labs Lab 05/05/17 1428 05/05/17 1819 05/06/17 0044 05/06/17 0454  TROPONINI 0.03* 0.08* 0.08* 0.08*   BNP (last 3 results) No results for input(s): PROBNP in the last 8760 hours. HbA1C:  Recent Labs  05/05/17 1819  HGBA1C 7.3*   CBG: No results for input(s): GLUCAP in the last 168 hours. Lipid Profile: No results for input(s): CHOL, HDL, LDLCALC, TRIG, CHOLHDL, LDLDIRECT in the last 72 hours. Thyroid Function Tests:  Recent Labs  05/05/17 1629  TSH 1.966   Anemia Panel: No results for input(s): VITAMINB12, FOLATE, FERRITIN, TIBC, IRON, RETICCTPCT in the last 72 hours. Urine analysis:    Component Value Date/Time   COLORURINE YELLOW 09/23/2012 1515   APPEARANCEUR HAZY (A) 09/23/2012 1515   LABSPEC 1.019 09/23/2012 1515   PHURINE 7.0 09/23/2012 1515   GLUCOSEU NEGATIVE 09/23/2012 1515   HGBUR NEGATIVE 09/23/2012 1515   BILIRUBINUR small 05/03/2013 0845   KETONESUR NEGATIVE 09/23/2012 1515   PROTEINUR trace 05/03/2013 0845   PROTEINUR NEGATIVE 09/23/2012 1515   UROBILINOGEN 0.2 05/03/2013 0845   UROBILINOGEN 1.0 09/23/2012 1515   NITRITE neg 05/03/2013 0845   NITRITE NEGATIVE 09/23/2012 1515   LEUKOCYTESUR Negative 05/03/2013 0845   Sepsis Labs: @LABRCNTIP (procalcitonin:4,lacticidven:4)  ) Recent Results (from the past 240 hour(s))  MRSA PCR Screening     Status: None   Collection Time: 05/06/17 10:10 AM  Result Value Ref Range Status   MRSA by PCR NEGATIVE NEGATIVE Final    Comment:        The GeneXpert MRSA Assay (FDA approved for NASAL specimens only), is one component of  a comprehensive MRSA colonization surveillance program. It is not intended to diagnose MRSA infection nor to guide or monitor treatment for MRSA infections.       Studies: No results found.  Scheduled Meds: . amLODipine  5 mg Oral Daily  . aspirin EC  81 mg Oral Daily  . atorvastatin  20 mg Oral Daily  . cholecalciferol  2,000 Units Oral Daily  . fluticasone  2 spray Each Nare Daily  . folic acid  1 mg Oral Daily  . gentamicin irrigation  80 mg Irrigation On Call  . loratadine  10 mg Oral Daily  . metoprolol succinate  100 mg Oral Daily  . multivitamin with minerals  1 tablet Oral Daily  . ramipril  10 mg Oral  Daily  . sodium chloride flush  3 mL Intravenous Q12H  . sodium chloride flush  3 mL Intravenous Q12H  . tamsulosin  0.4 mg Oral Daily  . thiamine  100 mg Oral Daily   Or  . thiamine  100 mg Intravenous Daily    Continuous Infusions: . sodium chloride 10 mL/hr at 05/05/17 2309  . sodium chloride    . sodium chloride 50 mL/hr at 05/07/17 0800  .  ceFAZolin (ANCEF) IV       LOS: 2 days     Alma Friendly, MD [facility]   If 7PM-7AM, please contact night-coverage [email or web address] Password [facility] 05/07/2017, 3:26 PM

## 2017-05-08 ENCOUNTER — Inpatient Hospital Stay (HOSPITAL_COMMUNITY): Payer: Medicare Other

## 2017-05-08 LAB — CBC WITH DIFFERENTIAL/PLATELET
Basophils Absolute: 0 10*3/uL (ref 0.0–0.1)
Basophils Relative: 0 %
Eosinophils Absolute: 0.2 10*3/uL (ref 0.0–0.7)
Eosinophils Relative: 2 %
HCT: 41.6 % (ref 39.0–52.0)
Hemoglobin: 14 g/dL (ref 13.0–17.0)
Lymphocytes Relative: 20 %
Lymphs Abs: 1.6 10*3/uL (ref 0.7–4.0)
MCH: 29.9 pg (ref 26.0–34.0)
MCHC: 33.7 g/dL (ref 30.0–36.0)
MCV: 88.9 fL (ref 78.0–100.0)
Monocytes Absolute: 0.5 10*3/uL (ref 0.1–1.0)
Monocytes Relative: 7 %
Neutro Abs: 5.4 10*3/uL (ref 1.7–7.7)
Neutrophils Relative %: 71 %
Platelets: 142 10*3/uL — ABNORMAL LOW (ref 150–400)
RBC: 4.68 MIL/uL (ref 4.22–5.81)
RDW: 13.8 % (ref 11.5–15.5)
WBC: 7.7 10*3/uL (ref 4.0–10.5)

## 2017-05-08 LAB — BASIC METABOLIC PANEL
Anion gap: 9 (ref 5–15)
BUN: 10 mg/dL (ref 6–20)
CO2: 26 mmol/L (ref 22–32)
Calcium: 8.2 mg/dL — ABNORMAL LOW (ref 8.9–10.3)
Chloride: 101 mmol/L (ref 101–111)
Creatinine, Ser: 0.9 mg/dL (ref 0.61–1.24)
GFR calc Af Amer: >60 mL/min (ref >60)
GFR calc non Af Amer: >60 mL/min (ref >60)
Glucose, Bld: 151 mg/dL — ABNORMAL HIGH (ref 65–99)
Potassium: 3.1 mmol/L — ABNORMAL LOW (ref 3.5–5.1)
Sodium: 136 mmol/L (ref 135–145)

## 2017-05-08 LAB — LIPID PANEL
Cholesterol: 134 mg/dL (ref 0–200)
HDL: 28 mg/dL — ABNORMAL LOW (ref >40)
LDL Cholesterol: UNDETERMINED mg/dL (ref 0–99)
Total CHOL/HDL Ratio: 4.8 RATIO
Triglycerides: 420 mg/dL — ABNORMAL HIGH (ref <150)
VLDL: UNDETERMINED mg/dL (ref 0–40)

## 2017-05-08 MED ORDER — POTASSIUM CHLORIDE CRYS ER 20 MEQ PO TBCR
40.0000 meq | EXTENDED_RELEASE_TABLET | Freq: Two times a day (BID) | ORAL | Status: AC
  Administered 2017-05-08 (×2): 40 meq via ORAL
  Filled 2017-05-08 (×2): qty 2

## 2017-05-08 NOTE — Evaluation (Signed)
Physical Therapy Evaluation Patient Details Name: Christopher [name] MRN: [id number] DOB: 1951-06-23 Today's Date: 05/08/2017   History of Present Illness  66 y.o. male with a history of coronary artery disease, MVR who was admitted to the hospital with a VF arrest. ICD implanted.    Clinical Impression  Pt admitted with above diagnosis. Pt currently with functional limitations due to the deficits listed below (see PT Problem List). Pt was able to ambulate in hallways with good stability with min challenges.  Could not withstand mod challenges and recommended pt use RW for stability however pt states his home is not conducive to use RW inside.  He and sister report that its so enclosed he can use furniture.  He does agree to get HHPT and HHOT.  Recommended pt use RW outdoors for safety.   Pt agrees with plan.  Did give a handout about concussion per sister request.  Asked OT to come and assess cognition further. Pt will benefit from skilled PT to increase their independence and safety with mobility to allow discharge to the venue listed below.      Follow Up Recommendations Home health PT;Supervision - Intermittent (HHOT)    Equipment Recommendations  None recommended by PT    Recommendations for Other Services       Precautions / Restrictions Precautions Precautions: Fall;ICD/Pacemaker Restrictions Weight Bearing Restrictions: No      Mobility  Bed Mobility Overal bed mobility: Independent             General bed mobility comments: Pt did use momentum  Transfers Overall transfer level: Needs assistance Equipment used: None Transfers: Sit to/from Stand Sit to Stand: Min guard         General transfer comment: Pt was able to stand with min guard assist only.  No LOB.    Ambulation/Gait Ambulation/Gait assistance: Min guard Ambulation Distance (Feet): 300 Feet Assistive device: None Gait Pattern/deviations: Step-through pattern;Decreased stride length   Gait velocity  interpretation: Below normal speed for age/gender General Gait Details: Pt was able to ambulate without LOB with min challenges to balance.  Does feel weak and feels he is not at baseline. When encouraged to use his RW, pt states he has little room in house for RW.  Sister agrees that house is not the issue as he can reach for furniture.  Pt does state he will use RW outdoors.    Stairs            Wheelchair Mobility    Modified Rankin (Stroke Patients Only)       Balance Overall balance assessment: Needs assistance;History of Falls Sitting-balance support: No upper extremity supported;Feet supported Sitting balance-Leahy Scale: Fair     Standing balance support: No upper extremity supported;During functional activity Standing balance-Leahy Scale: Fair Standing balance comment: stands statically without UE support.  Pt can withstand min challenges to balance but reports feeling weak.                               Pertinent Vitals/Pain Pain Assessment: No/denies pain  VSS  Home Living Family/patient expects to be discharged to:: Private residence Living Arrangements: Alone Available Help at Discharge: Friend(s);Available PRN/intermittently (has a girlfriend) Type of Home: House Home Access: Stairs to enter   [employer] of Steps: 1 Home Layout: One level Home Equipment: Bedside commode;Shower seat;Walker - 2 wheels      Prior Function Level of Independence: Independent  Comments: sister said pt has had a few falls. Pt states all falls are outside.     Hand Dominance        Extremity/Trunk Assessment   Upper Extremity Assessment Upper Extremity Assessment: Defer to OT evaluation    Lower Extremity Assessment Lower Extremity Assessment: Generalized weakness    Cervical / Trunk Assessment Cervical / Trunk Assessment: Normal  Communication   Communication: No difficulties  Cognition Arousal/Alertness: Awake/alert Behavior  During Therapy: Flat affect Overall Cognitive Status: Within Functional Limits for tasks assessed                                        General Comments      Exercises     Assessment/Plan    PT Assessment Patient needs continued PT services  PT Problem List Decreased activity tolerance;Decreased balance;Decreased mobility;Decreased knowledge of use of DME;Decreased safety awareness;Decreased knowledge of precautions       PT Treatment Interventions DME instruction;Gait training;Functional mobility training;Stair training;Therapeutic activities;Therapeutic exercise;Balance training;Patient/family education    PT Goals (Current goals can be found in the Care Plan section)  Acute Rehab PT Goals Patient Stated Goal: to go home PT Goal Formulation: With patient Time For Goal Achievement: 05/22/17 Potential to Achieve Goals: Good    Frequency Min 3X/week   Barriers to discharge Decreased caregiver support      Co-evaluation               AM-PAC PT "6 Clicks" Daily Activity  Outcome Measure Difficulty turning over in bed (including adjusting bedclothes, sheets and blankets)?: None Difficulty moving from lying on back to sitting on the side of the bed? : None Difficulty sitting down on and standing up from a chair with arms (e.g., wheelchair, bedside commode, etc,.)?: None Help needed moving to and from a bed to chair (including a wheelchair)?: None Help needed walking in hospital room?: A Little Help needed climbing 3-5 steps with a railing? : A Little 6 Click Score: 22    End of Session Equipment Utilized During Treatment: Gait belt Activity Tolerance: Patient limited by fatigue Patient left: in chair;with call bell/phone within reach;with family/visitor present Nurse Communication: Mobility status PT Visit Diagnosis: Unsteadiness on feet (R26.81);Muscle weakness (generalized) (M62.81)    Time: 9449-6759 PT Time Calculation (min) (ACUTE ONLY): 33  min   Charges:   PT Evaluation $PT Eval Moderate Complexity: 1 Mod PT Treatments $Gait Training: 8-22 mins   PT G Codes:        Tamarah Bhullar,PT Acute Rehabilitation [phone] (pager)   Denice Paradise 05/08/2017, 1:36 PM

## 2017-05-08 NOTE — Progress Notes (Signed)
Occupational Therapy Evaluation and defer to [facility] Patient Details Name: Christopher [name] MRN: [id number] DOB: 10/05/1950 Today's Date: 05/08/2017    History of Present Illness 66 y.o. male with a history of coronary artery disease, MVR who was admitted to the hospital with a VF arrest. ICD implanted.     Clinical Impression   PTA Pt independent in ADL and mobility (with Pt and sister reporting falls outside). Pt is currently min guard to supervision for ADL, fatigues quickly and presents with the below problems (see OT problem list). OT administered MOCA cognitive assessment and Pt scored 27/30 which falls within the normal range. OT still believes that [place] is absolutely necessary to provide energy conservation education, fall risk assessment, and address deficits in activity tolerance and safety awareness. OT will defer further services, as they are more appropriate in the home setting. Thank you for the opportunity to serve this patient.     Follow Up Recommendations  Home health OT    Equipment Recommendations  None recommended by OT    Recommendations for Other Services       Precautions / Restrictions Precautions Precautions: Fall;ICD/Pacemaker Restrictions Weight Bearing Restrictions: No      Mobility Bed Mobility Overal bed mobility: Independent             General bed mobility comments: Pt sitting OOB in recliner when OT entered  Transfers Overall transfer level: Needs assistance Equipment used: None Transfers: Sit to/from Stand Sit to Stand: Min guard         General transfer comment: Pt was able to stand with min guard for safety.  No LOB.      Balance Overall balance assessment: Needs assistance;History of Falls Sitting-balance support: No upper extremity supported;Feet supported Sitting balance-Leahy Scale: Fair     Standing balance support: No upper extremity supported;During functional activity Standing balance-Leahy Scale: Fair Standing balance  comment: stands statically without UE support.  Pt can withstand min challenges to balance but reports feeling weak.                             ADL either performed or assessed with clinical judgement   ADL Overall ADL's : Needs assistance/impaired Eating/Feeding: Independent;Sitting Eating/Feeding Details (indicate cue type and reason): eating lunch as OT entered room Grooming: Min guard   Upper Body Bathing: Modified independent   Lower Body Bathing: Supervison/ safety   Upper Body Dressing : Modified independent   Lower Body Dressing: Supervision/safety   Toilet Transfer: Min guard   Toileting- Clothing Manipulation and Hygiene: Supervision/safety   Tub/ Banker: Supervision/safety   Functional mobility during ADLs:  (not assessed this session)       Vision Baseline Vision/History: No visual deficits Patient Visual Report: No change from baseline       Perception     Praxis      Pertinent Vitals/Pain Pain Assessment: No/denies pain     Hand Dominance     Extremity/Trunk Assessment Upper Extremity Assessment Upper Extremity Assessment: Generalized weakness   Lower Extremity Assessment Lower Extremity Assessment: Defer to PT evaluation   Cervical / Trunk Assessment Cervical / Trunk Assessment: Normal   Communication Communication Communication: No difficulties   Cognition Arousal/Alertness: Awake/alert Behavior During Therapy: Flat affect Overall Cognitive Status: Within Functional Limits for tasks assessed  General Comments: MOCA assessment performed and Pt able to score 27/30, within normal range.   General Comments  MOCA administered to Pt - he scored 27/30 which is in the normal range. He lost points on visuospatia/executive section and delayed recall; sister in room for the whole session    Exercises     Shoulder Instructions      Home Living Family/patient expects to be  discharged to:: Private residence Living Arrangements: Alone Available Help at Discharge: Friend(s);Available PRN/intermittently (has a girlfriend) Type of Home: House Home Access: Stairs to enter [employer] of Steps: 1   Home Layout: One level     Bathroom Shower/Tub: Tub only;Walk-in shower   Bathroom Toilet: Standard     Home Equipment: Bedside commode;Walker - 2 wheels;Shower seat - built in          Prior Functioning/Environment Level of Independence: Independent        Comments: sister said pt has had a few falls. Pt states all falls are outside.        OT Problem List: Decreased strength;Decreased activity tolerance;Impaired balance (sitting and/or standing);Decreased knowledge of use of DME or AE;Obesity      OT Treatment/Interventions:      OT Goals(Current goals can be found in the care plan section) Acute Rehab OT Goals Patient Stated Goal: to go home OT Goal Formulation: With patient/family Time For Goal Achievement: 05/22/17 Potential to Achieve Goals: Good  OT Frequency:     Barriers to D/C:            Co-evaluation              AM-PAC PT "6 Clicks" Daily Activity     Outcome Measure Help from another person eating meals?: None Help from another person taking care of personal grooming?: None Help from another person toileting, which includes using toliet, bedpan, or urinal?: None Help from another person bathing (including washing, rinsing, drying)?: A Little Help from another person to put on and taking off regular upper body clothing?: None Help from another person to put on and taking off regular lower body clothing?: A Little 6 Click Score: 22   End of Session Equipment Utilized During Treatment: Gait belt Nurse Communication: Mobility status  Activity Tolerance: Patient tolerated treatment well Patient left: in chair;with call bell/phone within reach;with family/visitor present  OT Visit Diagnosis: Unsteadiness on  feet (R26.81);Other abnormalities of gait and mobility (R26.89);History of falling (Z91.81);Muscle weakness (generalized) (M62.81)                Time: 6659-9357 OT Time Calculation (min): 23 min Charges:  OT General Charges $OT Visit: 1 Visit OT Evaluation $OT Eval Moderate Complexity: 1 Mod OT Treatments $Self Care/Home Management : 8-22 mins G-Codes:    Hulda Humphrey OTR/L [place] 05/08/2017, 2:58 PM

## 2017-05-08 NOTE — Progress Notes (Signed)
Progress Note  Patient Name: Christopher [name] Date of Encounter: 05/08/2017  Primary Cardiologist: Lynnda Shields  Subjective   Feeling well without major complaint.  Inpatient Medications    Scheduled Meds: . amLODipine  5 mg Oral Daily  . aspirin EC  81 mg Oral Daily  . atorvastatin  20 mg Oral Daily  . cholecalciferol  2,000 Units Oral Daily  . fluticasone  2 spray Each Nare Daily  . folic acid  1 mg Oral Daily  . loratadine  10 mg Oral Daily  . metoprolol succinate  100 mg Oral Daily  . multivitamin with minerals  1 tablet Oral Daily  . potassium chloride  40 mEq Oral BID  . ramipril  10 mg Oral Daily  . sodium chloride flush  3 mL Intravenous Q12H  . sodium chloride flush  3 mL Intravenous Q12H  . tamsulosin  0.4 mg Oral Daily  . thiamine  100 mg Oral Daily   Or  . thiamine  100 mg Intravenous Daily   Continuous Infusions: . sodium chloride 10 mL/hr at 05/05/17 2309  . sodium chloride    .  ceFAZolin (ANCEF) IV 1 g (05/08/17 0855)   PRN Meds: sodium chloride, acetaminophen, LORazepam **OR** LORazepam, ondansetron (ZOFRAN) IV, oxyCODONE, prochlorperazine, sodium chloride flush   Vital Signs    Vitals:   05/07/17 2130 05/08/17 0300 05/08/17 0600 05/08/17 0717  BP:  (!) 162/90  (!) 175/85  Pulse: 71  65 62  Resp: 19   16  Temp:  98 F (36.7 C)    TempSrc:  Oral    SpO2: 98%   100%  Weight:      Height:        Intake/Output Summary (Last 24 hours) at 05/08/17 0934 Last data filed at 05/08/17 0530  Gross per 24 hour  Intake              300 ml  Output              850 ml  Net             -550 ml   Filed Weights   05/05/17 1459 05/07/17 0500  Weight: 250 lb (113.4 kg) 253 lb 6.4 oz (114.9 kg)    Telemetry    Sinus rhythm, PVCs- Personally Reviewed  ECG    Sinus rhythm, PVCs- Personally Reviewed  Physical Exam   GEN: No acute distress.   Neck: No JVD Cardiac: RRR, no murmurs, rubs, or gallops.  Respiratory: Clear to auscultation  bilaterally. GI: Soft, nontender, non-distended  MS: No edema; No deformity. Neuro:  Nonfocal  Psych: Normal affect   Labs    Chemistry Recent Labs Lab 05/05/17 1428 05/06/17 0454 05/07/17 0825 05/08/17 0245  NA 138 136 137 136  K 3.3* 3.4* 3.5 3.1*  CL 102 102 105 101  CO2 24 24 25 26   GLUCOSE 227* 186* 175* 151*  BUN 13 11 11 10   CREATININE 0.94 0.87 0.86 0.90  CALCIUM 8.6* 8.7* 8.3* 8.2*  PROT 7.1 6.6  --   --   ALBUMIN 4.0 3.7  --   --   AST 83* 44*  --   --   ALT 71* 56  --   --   ALKPHOS 87 62  --   --   BILITOT 1.0 1.1  --   --   GFRNONAA >60 >60 >60 >60  GFRAA >60 >60 >60 >60  ANIONGAP 12 10 7  9  Hematology Recent Labs Lab 05/05/17 1425 05/06/17 0454 05/08/17 0245  WBC 7.4 10.4 7.7  RBC 5.21 4.75 4.68  HGB 15.7 14.6 14.0  HCT 45.8 41.5 41.6  MCV 87.9 87.4 88.9  MCH 30.1 30.7 29.9  MCHC 34.3 35.2 33.7  RDW 13.6 13.8 13.8  PLT 156 156 142*    Cardiac Enzymes Recent Labs Lab 05/05/17 1428 05/05/17 1819 05/06/17 0044 05/06/17 0454  TROPONINI 0.03* 0.08* 0.08* 0.08*    Recent Labs Lab 05/05/17 1429  TROPIPOC 0.00     BNP Recent Labs Lab 05/05/17 1819  BNP 139.2*     DDimer No results for input(s): DDIMER in the last 168 hours.   Radiology    Dg Chest 2 View  Result Date: 05/08/2017 CLINICAL DATA:  66 year old male with a history of status post ICD implantation EXAM: CHEST  2 VIEW COMPARISON:  05/05/2017 FINDINGS: Cardiomediastinal silhouette unchanged in size and contour. Interval improvement in lung aeration, with resolving interlobular septal thickening. Opacity in the retrocardiac region and at the medial right lung base. Lateral view limited by respiratory motion, although there is blunting at the costophrenic angle. No pneumothorax. Interval placement of left chest wall cardiac pacing device/AICD with single lead. Interval removal of gastric to IMPRESSION: Interval placement of left chest wall cardiac AICD/ pacing device, with  no pneumothorax. Interval removal of gastric tube. Overall there is improved aeration of the lungs compared to the prior, with mild bibasilar opacity, potentially representing a combination of small pleural fluid and/or atelectasis/consolidation. Electronically Signed   By: Corrie Mckusick D.O.   On: 05/08/2017 09:19    Cardiac Studies   TTE - Left ventricle: The cavity size was mildly dilated. Wall   thickness was increased in a pattern of mild LVH. Systolic   function was mildly to moderately reduced. The estimated ejection   fraction was in the range of 40% to 45%. Diffuse hypokinesis.   Doppler parameters are consistent with abnormal left ventricular   relaxation (grade 1 diastolic dysfunction). - Aortic valve: There was no stenosis. - Mitral valve: S/p mitral valve repair. Mildly elevated mean   gradient across the mitral valve but valve area by pressure   half-time is not significantly elevated. There was trivial   regurgitation. Mean gradient (D): 6 mm Hg. Valve area by pressure   half-time: 2 cm^2. - Left atrium: The atrium was mildly dilated. - Right ventricle: The cavity size was normal. Systolic function   was normal. - Pulmonary arteries: No complete TR doppler jet so unable to   estimate PA systolic pressure. - Systemic veins: IVC not visualized.  Patient Profile     66 y.o. male with a history of coronary artery disease, MVR who was admitted to the hospital with a VF arrest.  Assessment & Plan    1.  VF arrest That is post [employer] ICD implant.  Chest x-ray and interrogation without major issue.  Plan for follow-up in device clinic in 10-14 days.  Should the chest x-ray, per radiology, showed no pneumothorax, patient would be able to be discharged per EP.  2.  CAD No ischemic symptoms.  Catheter results noted.  Continue current therapy.  3.  HTN Blood pressure has been normal but is high currently.  Patient recently received his medications.  No changes.  For  questions or updates, please contact [place] Please consult [email or web address] for contact info under Cardiology/STEMI.      Signed, Da Michelle Meredith Leeds, MD  05/08/2017, 9:34 AM

## 2017-05-08 NOTE — Progress Notes (Signed)
PROGRESS NOTE  Christopher [name] [facility]:[id number] DOB: [birth year] DOA: 05/05/2017 PCP: Hoyt Koch, MD  HPI/Recap of past 59 hours: 66 year old male with a history significant for hypertension, paroxysmal A. 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  Patient had an episode of syncope at a grocery store and was found to be in V. fib, underwent CPR and defibrillation x2.  Regained consciousness after about 2 minutes.  Patient admitted for post cardiac arrest.  History of ??  Alcohol abuse.  History of recent weight gain  Today, patient denies any chest pain, worsening shortness of breath, diaphoresis, abdominal pain, nausea/vomiting, fever/chills, dizziness. Patient reports feeling better overall.  Patient is post ICD placement.  Assessment/Plan: Principal Problem:   Altered mental status Active Problems:   Cardiac arrest with ventricular fibrillation (HCC)   Alcohol use   Acute hypokalemia   CAD (coronary artery disease)   Hypertension   OSA (obstructive sleep apnea)   History of repair of mitral valve   Scalp hematoma  #Cardiac arrest due to V. Fib Stable, status post ICD placement on 05/07/17 Unclear etiology, no reversible cause found Echo shows LVEF of 40-45%, diffuse hypokinesis and evidence of mitral valve repair in place Cardiac cath shows nonobstructive CAD Cardiology/EP on board Continue metoprolol 100 mg daily PT/OT  #Hypertension Continue amlodipine, metoprolol, ramipril  #Type 2 diabetes A1c greater than 7 Patient follow-up with PCP  #Scalp hematoma Status post syncope, mild soreness  #??  Alcohol abuse Unclear history UDS negative CIWA, Ativan as needed  #Obstructive sleep apnea CPAP    Code Status: Full  Family Communication: Sister at bedside, all  questions answered  Disposition Plan: Home once stable   Consultants:  Cardiology  EP  Procedures:  Cardiac cath done on 11/2  ICD placement done on 11/2  Antimicrobials:  None  DVT prophylaxis: SCDs for now   Objective: Vitals:   05/07/17 2130 05/08/17 0300 05/08/17 0600 05/08/17 0717  BP:  (!) 162/90  (!) 175/85  Pulse: 71  65 62  Resp: 19   16  Temp:  98 F (36.7 C)  98.4 F (36.9 C)  TempSrc:  Oral  Oral  SpO2: 98%   100%  Weight:      Height:        Intake/Output Summary (Last 24 hours) at 05/08/17 1328 Last data filed at 05/08/17 0530  Gross per 24 hour  Intake              300 ml  Output              850 ml  Net             -550 ml   Filed Weights   05/05/17 1459 05/07/17 0500  Weight: 113.4 kg (250 lb) 114.9 kg (253 lb 6.4 oz)    Exam:   General: Alert, awake, oriented x3  Cardiovascular: S1-S2 present, no added heart sound, ICD noted  Respiratory: Chest clear bilaterally  Abdomen: Soft, nontender, nondistended  Musculoskeletal: No bilateral pedal edema  Skin: Normal  Psychiatry: Normal mood   Data Reviewed: CBC:  Recent Labs Lab 05/05/17 1425 05/06/17 0454 05/08/17 0245  WBC 7.4 10.4 7.7  NEUTROABS  --   --  5.4  HGB 15.7 14.6 14.0  HCT 45.8 41.5 41.6  MCV 87.9  87.4 88.9  PLT 156 156 335*   Basic Metabolic Panel:  Recent Labs Lab 05/05/17 1428 05/06/17 0454 05/07/17 0825 05/08/17 0245  NA 138 136 137 136  K 3.3* 3.4* 3.5 3.1*  CL 102 102 105 101  CO2 24 24 25 26   GLUCOSE 227* 186* 175* 151*  BUN 13 11 11 10   CREATININE 0.94 0.87 0.86 0.90  CALCIUM 8.6* 8.7* 8.3* 8.2*  MG 2.2  --   --   --   PHOS 3.0  --   --   --    GFR: Estimated Creatinine Clearance: 101.2 mL/min (by C-G formula based on SCr of 0.9 mg/dL). Liver Function Tests:  Recent Labs Lab 05/05/17 1428 05/06/17 0454  AST 83* 44*  ALT 71* 56  ALKPHOS 87 62  BILITOT 1.0 1.1  PROT 7.1 6.6  ALBUMIN 4.0 3.7   No results for input(s):  LIPASE, AMYLASE in the last 168 hours. No results for input(s): AMMONIA in the last 168 hours. Coagulation Profile: No results for input(s): INR, PROTIME in the last 168 hours. Cardiac Enzymes:  Recent Labs Lab 05/05/17 1428 05/05/17 1819 05/06/17 0044 05/06/17 0454  TROPONINI 0.03* 0.08* 0.08* 0.08*   BNP (last 3 results) No results for input(s): PROBNP in the last 8760 hours. HbA1C:  Recent Labs  05/05/17 1819  HGBA1C 7.3*   CBG: No results for input(s): GLUCAP in the last 168 hours. Lipid Profile:  Recent Labs  05/08/17 0245  CHOL 134  HDL 28*  LDLCALC UNABLE TO CALCULATE IF TRIGLYCERIDE OVER 400 mg/dL  TRIG 420*  CHOLHDL 4.8   Thyroid Function Tests:  Recent Labs  05/05/17 1629  TSH 1.966   Anemia Panel: No results for input(s): VITAMINB12, FOLATE, FERRITIN, TIBC, IRON, RETICCTPCT in the last 72 hours. Urine analysis:    Component Value Date/Time   COLORURINE YELLOW 09/23/2012 1515   APPEARANCEUR HAZY (A) 09/23/2012 1515   LABSPEC 1.019 09/23/2012 1515   PHURINE 7.0 09/23/2012 1515   GLUCOSEU NEGATIVE 09/23/2012 1515   HGBUR NEGATIVE 09/23/2012 1515   BILIRUBINUR small 05/03/2013 0845   KETONESUR NEGATIVE 09/23/2012 1515   PROTEINUR trace 05/03/2013 0845   PROTEINUR NEGATIVE 09/23/2012 1515   UROBILINOGEN 0.2 05/03/2013 0845   UROBILINOGEN 1.0 09/23/2012 1515   NITRITE neg 05/03/2013 0845   NITRITE NEGATIVE 09/23/2012 1515   LEUKOCYTESUR Negative 05/03/2013 0845   Sepsis Labs: @LABRCNTIP (procalcitonin:4,lacticidven:4)  ) Recent Results (from the past 240 hour(s))  MRSA PCR Screening     Status: None   Collection Time: 05/06/17 10:10 AM  Result Value Ref Range Status   MRSA by PCR NEGATIVE NEGATIVE Final    Comment:        The GeneXpert MRSA Assay (FDA approved for NASAL specimens only), is one component of a comprehensive MRSA colonization surveillance program. It is not intended to diagnose MRSA infection nor to guide or monitor  treatment for MRSA infections.       Studies: Dg Chest 2 View  Result Date: 05/08/2017 CLINICAL DATA:  66 year old male with a history of status post ICD implantation EXAM: CHEST  2 VIEW COMPARISON:  05/05/2017 FINDINGS: Cardiomediastinal silhouette unchanged in size and contour. Interval improvement in lung aeration, with resolving interlobular septal thickening. Opacity in the retrocardiac region and at the medial right lung base. Lateral view limited by respiratory motion, although there is blunting at the costophrenic angle. No pneumothorax. Interval placement of left chest wall cardiac pacing device/AICD with single lead. Interval removal of gastric to IMPRESSION:  Interval placement of left chest wall cardiac AICD/ pacing device, with no pneumothorax. Interval removal of gastric tube. Overall there is improved aeration of the lungs compared to the prior, with mild bibasilar opacity, potentially representing a combination of small pleural fluid and/or atelectasis/consolidation. Electronically Signed   By: Corrie Mckusick D.O.   On: 05/08/2017 09:19    Scheduled Meds: . amLODipine  5 mg Oral Daily  . aspirin EC  81 mg Oral Daily  . atorvastatin  20 mg Oral Daily  . cholecalciferol  2,000 Units Oral Daily  . fluticasone  2 spray Each Nare Daily  . folic acid  1 mg Oral Daily  . loratadine  10 mg Oral Daily  . metoprolol succinate  100 mg Oral Daily  . multivitamin with minerals  1 tablet Oral Daily  . potassium chloride  40 mEq Oral BID  . ramipril  10 mg Oral Daily  . sodium chloride flush  3 mL Intravenous Q12H  . sodium chloride flush  3 mL Intravenous Q12H  . tamsulosin  0.4 mg Oral Daily  . thiamine  100 mg Oral Daily    Continuous Infusions: . sodium chloride 10 mL/hr at 05/05/17 2309  . sodium chloride    .  ceFAZolin (ANCEF) IV Stopped (05/08/17 0925)     LOS: 3 days     Alma Friendly, MD [facility]   If 7PM-7AM, please contact  night-coverage [email or web address] Password [email or web address] 05/08/2017, 1:28 PM

## 2017-05-09 ENCOUNTER — Other Ambulatory Visit: Payer: Self-pay

## 2017-05-09 ENCOUNTER — Encounter (HOSPITAL_COMMUNITY): Payer: Self-pay | Admitting: *Deleted

## 2017-05-09 DIAGNOSIS — E876 Hypokalemia: Secondary | ICD-10-CM

## 2017-05-09 DIAGNOSIS — I2510 Atherosclerotic heart disease of native coronary artery without angina pectoris: Secondary | ICD-10-CM

## 2017-05-09 DIAGNOSIS — Z9889 Other specified postprocedural states: Secondary | ICD-10-CM

## 2017-05-09 LAB — CBC WITH DIFFERENTIAL/PLATELET
Basophils Absolute: 0 10*3/uL (ref 0.0–0.1)
Basophils Relative: 0 %
Eosinophils Absolute: 0.2 10*3/uL (ref 0.0–0.7)
Eosinophils Relative: 3 %
HCT: 42.2 % (ref 39.0–52.0)
Hemoglobin: 14.4 g/dL (ref 13.0–17.0)
Lymphocytes Relative: 21 %
Lymphs Abs: 1.5 10*3/uL (ref 0.7–4.0)
MCH: 30.3 pg (ref 26.0–34.0)
MCHC: 34.1 g/dL (ref 30.0–36.0)
MCV: 88.8 fL (ref 78.0–100.0)
Monocytes Absolute: 0.6 10*3/uL (ref 0.1–1.0)
Monocytes Relative: 8 %
Neutro Abs: 4.8 10*3/uL (ref 1.7–7.7)
Neutrophils Relative %: 68 %
Platelets: 139 10*3/uL — ABNORMAL LOW (ref 150–400)
RBC: 4.75 MIL/uL (ref 4.22–5.81)
RDW: 13.8 % (ref 11.5–15.5)
WBC: 7.1 10*3/uL (ref 4.0–10.5)

## 2017-05-09 LAB — BASIC METABOLIC PANEL
Anion gap: 9 (ref 5–15)
BUN: 11 mg/dL (ref 6–20)
CO2: 26 mmol/L (ref 22–32)
Calcium: 8.6 mg/dL — ABNORMAL LOW (ref 8.9–10.3)
Chloride: 101 mmol/L (ref 101–111)
Creatinine, Ser: 0.91 mg/dL (ref 0.61–1.24)
GFR calc Af Amer: >60 mL/min (ref >60)
GFR calc non Af Amer: >60 mL/min (ref >60)
Glucose, Bld: 161 mg/dL — ABNORMAL HIGH (ref 65–99)
Potassium: 3.4 mmol/L — ABNORMAL LOW (ref 3.5–5.1)
Sodium: 136 mmol/L (ref 135–145)

## 2017-05-09 MED ORDER — AMLODIPINE BESYLATE 10 MG PO TABS
10.0000 mg | ORAL_TABLET | Freq: Every day | ORAL | Status: DC
  Administered 2017-05-09: 10 mg via ORAL
  Filled 2017-05-09: qty 1

## 2017-05-09 MED ORDER — POTASSIUM CHLORIDE CRYS ER 20 MEQ PO TBCR
40.0000 meq | EXTENDED_RELEASE_TABLET | Freq: Two times a day (BID) | ORAL | Status: AC
  Administered 2017-05-09: 40 meq via ORAL
  Filled 2017-05-09: qty 2

## 2017-05-09 NOTE — Discharge Instructions (Signed)
° ° °  Supplemental Discharge Instructions for  Pacemaker/Defibrillator Patients  Activity No heavy lifting or vigorous activity with your left/right arm for 6 to 8 weeks.  Do not raise your left/right arm above your head for one week.  Gradually raise your affected arm as drawn below.           __           05/11/17                 05/12/17                  05/13/17                05/14/17  NO DRIVING for  6 months   WOUND CARE - Keep the wound area clean and dry.  Do not get this area wet for one week. No showers for one week; you may shower on  05/14/17   . - The tape/steri-strips on your wound will fall off; do not pull them off.  No bandage is needed on the site.  DO  NOT apply any creams, oils, or ointments to the wound area. - If you notice any drainage or discharge from the wound, any swelling or bruising at the site, or you develop a fever > 101? F after you are discharged home, call the office at once.  Special Instructions - You are still able to use cellular telephones; use the ear opposite the side where you have your pacemaker/defibrillator.  Avoid carrying your cellular phone near your device. - When traveling through airports, show security personnel your identification card to avoid being screened in the metal detectors.  Ask the security personnel to use the hand wand. - Avoid arc welding equipment, TENS units (transcutaneous nerve stimulators).  Call the office for questions about other devices. - Avoid electrical appliances that are in poor condition or are not properly grounded. - Microwave ovens are safe to be near or to operate.  Additional information for defibrillator patients should your device go off: - If your device goes off ONCE and you feel fine afterward, notify the device clinic nurses. - If your device goes off ONCE and you do not feel well afterward, call 911. - If your device goes off TWICE, call 911. - If your device goes off THREE times in one day, call  911.  DO NOT DRIVE YOURSELF OR A FAMILY MEMBER WITH A DEFIBRILLATOR TO THE HOSPITAL--CALL 911.

## 2017-05-09 NOTE — Progress Notes (Signed)
Discharge instructions given to patient, all questions answered at this time.  Pt. VSS with no s/s of distress noted.  Patient stable at discharge.

## 2017-05-09 NOTE — Care Management Note (Signed)
Case Management Note  Patient Details  Name: IVORY BAIL MRN: [id number] Date of Birth: 1951-05-13  Subjective/Objective:  66 y.o. M to be discharged with HHPT and [facility] [place]. Jermaine with [facility] has accepted referral. No DME needs. They are aware a representative will contact them within 24-48 hours of discharge                  Action/Plan: CM will sign off for now but will be available should additional discharge needs arise or disposition change.    Expected Discharge Date:  05/09/17               Expected Discharge Plan:     In-House Referral:  Clinical Social Work  Discharge planning Services  CM Consult  Post Acute Care Choice:  Home Health Choice offered to:  Patient, Spouse  DME Arranged:    DME Agency:     HH Arranged:  PT, OT HH Agency:  [facility]  Status of Service:  Completed, signed off  If discussed at [place] of Stay Meetings, dates discussed:    Additional Comments:  Delrae Sawyers, RN 05/09/2017, 4:20 PM

## 2017-05-10 ENCOUNTER — Encounter (HOSPITAL_COMMUNITY): Payer: Self-pay | Admitting: Cardiology

## 2017-05-11 ENCOUNTER — Telehealth: Payer: Self-pay | Admitting: Internal Medicine

## 2017-05-11 DIAGNOSIS — I110 Hypertensive heart disease with heart failure: Secondary | ICD-10-CM | POA: Diagnosis not present

## 2017-05-11 DIAGNOSIS — I340 Nonrheumatic mitral (valve) insufficiency: Secondary | ICD-10-CM | POA: Diagnosis not present

## 2017-05-11 DIAGNOSIS — E876 Hypokalemia: Secondary | ICD-10-CM | POA: Diagnosis not present

## 2017-05-11 DIAGNOSIS — Z9889 Other specified postprocedural states: Secondary | ICD-10-CM | POA: Diagnosis not present

## 2017-05-11 DIAGNOSIS — E785 Hyperlipidemia, unspecified: Secondary | ICD-10-CM | POA: Diagnosis not present

## 2017-05-11 DIAGNOSIS — I2510 Atherosclerotic heart disease of native coronary artery without angina pectoris: Secondary | ICD-10-CM | POA: Diagnosis not present

## 2017-05-11 DIAGNOSIS — W19XXXD Unspecified fall, subsequent encounter: Secondary | ICD-10-CM | POA: Diagnosis not present

## 2017-05-11 DIAGNOSIS — R55 Syncope and collapse: Secondary | ICD-10-CM | POA: Diagnosis not present

## 2017-05-11 DIAGNOSIS — S0003XD Contusion of scalp, subsequent encounter: Secondary | ICD-10-CM | POA: Diagnosis not present

## 2017-05-11 DIAGNOSIS — I4901 Ventricular fibrillation: Secondary | ICD-10-CM | POA: Diagnosis not present

## 2017-05-11 DIAGNOSIS — Z8674 Personal history of sudden cardiac arrest: Secondary | ICD-10-CM | POA: Diagnosis not present

## 2017-05-11 DIAGNOSIS — I5022 Chronic systolic (congestive) heart failure: Secondary | ICD-10-CM | POA: Diagnosis not present

## 2017-05-11 DIAGNOSIS — Z95810 Presence of automatic (implantable) cardiac defibrillator: Secondary | ICD-10-CM | POA: Diagnosis not present

## 2017-05-11 DIAGNOSIS — E119 Type 2 diabetes mellitus without complications: Secondary | ICD-10-CM | POA: Diagnosis not present

## 2017-05-11 DIAGNOSIS — G4733 Obstructive sleep apnea (adult) (pediatric): Secondary | ICD-10-CM | POA: Diagnosis not present

## 2017-05-11 NOTE — Discharge Summary (Signed)
Discharge Summary  Christopher [name] OVF:[id number] DOB: [birth year]  PCP: Hoyt Koch, MD  Admit date: 05/05/2017 Discharge date: 05/09/2017  Time spent: >66mins  Recommendations for Outpatient Follow-up:  1. Cardiology 2. PCP  Discharge Diagnoses:  Active Hospital Problems   Diagnosis Date Noted  . Altered mental status 05/05/2017  . Cardiac arrest with ventricular fibrillation ([place]) 05/05/2017  . Alcohol use 05/05/2017  . Acute hypokalemia 05/05/2017  . CAD (coronary artery disease) 05/05/2017  . Hypertension 05/05/2017  . OSA (obstructive sleep apnea) 05/05/2017  . History of repair of mitral valve 05/05/2017  . Scalp hematoma 05/05/2017    Resolved Hospital Problems  No resolved problems to display.    Discharge Condition: Stable  Diet recommendation: Heart healthy  Vitals:   05/09/17 0850 05/09/17 1200  BP: (!) 153/98 (!) 150/87  Pulse: 72 74  Resp: 15   Temp: 98.3 F (36.8 C) 99.2 F (37.3 C)  SpO2: 96% 99%    History of present illness:  66 year old male with a history significant for hypertension, paroxysmal A. 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  Patient had an episode of syncope at a grocery store and was found to be in V. fib, underwent CPR and defibrillation x2.  Regained consciousness after about 2 minutes.  Patient admitted for post cardiac arrest.  History of ??  Alcohol abuse.  History of recent weight gain.  Patient is post ICD placement. Pt is stable for discharge  Hospital Course:  Principal Problem:   Altered mental status Active Problems:   Cardiac arrest with ventricular fibrillation ([facility])   Alcohol use   Acute hypokalemia   CAD (coronary artery disease)   Hypertension   OSA (obstructive sleep apnea)   History of repair of  mitral valve   Scalp hematoma  #Cardiac arrest due to V. Fib Stable, status post ICD placement on 05/07/17 Unclear etiology, no reversible cause found Echo shows LVEF of 40-45%, diffuse hypokinesis and evidence of mitral valve repair in place Cardiac cath shows nonobstructive CAD Cardiology/EP on board Continue metoprolol 100 mg daily Home PT/OT  #Hypertension Continue amlodipine, metoprolol, ramipril  #Type 2 diabetes A1c greater than 7 Patient follow-up with PCP  #Scalp hematoma Resolving Status post syncope, mild soreness  #??  Alcohol abuse Unclear history UDS negative  #Obstructive sleep apnea CPAP    Procedures:  Cardiac cath done on 11/2  ICD placement done on 11/2  Consultations:  Cardiology  EP  Discharge Exam: BP (!) 150/87   Pulse 74   Temp 99.2 F (37.3 C) (Oral)   Resp 15   Ht 5\' 10"  (1.778 m)   Wt 114.9 kg (253 lb 6.4 oz)   SpO2 99%   BMI 36.36 kg/m   General: Alert, awake, oriented x3 Cardiovascular: S1-S2 present, no added sound Respiratory: Chest clear bilaterally  Discharge Instructions You were cared for by a hospitalist during your hospital stay. If you have any questions about your discharge medications or the care you received while you were in the hospital after you are discharged, you can call the unit and asked to speak with the hospitalist on call if the hospitalist that took care of you is not available. Once you are discharged, your primary care physician will handle any further medical issues. Please note that NO REFILLS for any  discharge medications will be authorized once you are discharged, as it is imperative that you return to your primary care physician (or establish a relationship with a primary care physician if you do not have one) for your aftercare needs so that they can reassess your need for medications and monitor your lab values.  Discharge Instructions    Diet - low sodium heart healthy   Complete by:  As  directed    Increase activity slowly   Complete by:  As directed      Allergies as of 05/09/2017      Reactions   Codeine    Causes bad constipation   Dust Mite Extract Cough   Mold Extract [trichophyton] Cough   Pollen Extract Cough      Medication List    TAKE these medications   amLODipine 5 MG tablet Commonly known as:  NORVASC TAKE 1 TABLET BY MOUTH  DAILY What changed:    how much to take  how to take this  when to take this   aspirin EC 81 MG tablet Take 81 mg by mouth daily.   atorvastatin 20 MG tablet Commonly known as:  LIPITOR Take 1 tablet (20 mg total) by mouth daily.   cetirizine 10 MG tablet Commonly known as:  ZYRTEC Take 10 mg by mouth daily.   fluticasone 50 MCG/ACT nasal spray Commonly known as:  FLONASE USE 2 SPRAYS IN EACH  NOSTRIL DAILY   Melatonin 10 MG Tabs Take 10 mg by mouth at bedtime.   metoprolol succinate 100 MG 24 hr tablet Commonly known as:  TOPROL-XL TAKE 1 TABLET BY MOUTH  DAILY WITH OR IMMEDIATLEY  FOLLOWING A MEAL What changed:    how much to take  how to take this  when to take this  additional instructions   ramipril 10 MG capsule Commonly known as:  ALTACE Take 1 capsule (10 mg total) by mouth daily.   tamsulosin 0.4 MG Caps capsule Commonly known as:  FLOMAX TAKE 1 CAPSULE BY MOUTH  DAILY What changed:    how much to take  how to take this  when to take this   Vitamin D 2000 units Caps Take 2,000 Units by mouth daily.      Allergies  Allergen Reactions  . Codeine     Causes bad constipation  . Dust Mite Extract Cough  . Mold Extract [Trichophyton] Cough  . Pollen Extract Cough   Follow-up Information    [facility] Follow up on 05/20/2017.   Specialty:  Cardiology Why:  at [facility] information: [address] [place] [phone]       Patsey Berthold, NP Follow up on 06/24/2017.   Specialty:  Cardiology Why:  at  9:40AM Contact information: [place] [address] [phone]        Thompson Grayer, MD Follow up on 08/09/2017.   Specialty:  Cardiology Why:  at 9:45AM Contact information: [place] [address] [phone]        Hoyt Koch, MD. Schedule an appointment as soon as possible for a visit in 1 week(s).   Specialty:  Internal Medicine Contact information: [place] [address] [phone]        [facility] Follow up.   Why:  HHPT and OT has been ordered by your MD and will be provided by the above agency.  A representative from this agency will be in touch with you within 24 - 48 hours to arrange initial visit.  Contact information: [address] [place] [address] [phone]            The results of significant diagnostics from this hospitalization (including imaging, microbiology, ancillary and laboratory) are listed below for reference.    Significant Diagnostic Studies: Dg Chest 2 View  Result Date: 05/08/2017 CLINICAL DATA:  66 year old male with a history of status post ICD implantation EXAM: CHEST  2 VIEW COMPARISON:  05/05/2017 FINDINGS: Cardiomediastinal silhouette unchanged in size and contour. Interval improvement in lung aeration, with resolving interlobular septal thickening. Opacity in the retrocardiac region and at the medial right lung base. Lateral view limited by respiratory motion, although there is blunting at the costophrenic angle. No pneumothorax. Interval placement of left chest wall cardiac pacing device/AICD with single lead. Interval removal of gastric to IMPRESSION: Interval placement of left chest wall cardiac AICD/ pacing device, with no pneumothorax. Interval removal of gastric tube. Overall there is improved aeration of the lungs compared to the prior, with mild bibasilar opacity, potentially representing a combination of small pleural  fluid and/or atelectasis/consolidation. Electronically Signed   By: Corrie Mckusick D.O.   On: 05/08/2017 09:19   Ct Head Wo Contrast  Result Date: 05/05/2017 CLINICAL DATA:  Syncope. EXAM: CT HEAD WITHOUT CONTRAST CT CERVICAL SPINE WITHOUT CONTRAST TECHNIQUE: Multidetector CT imaging of the head and cervical spine was performed following the standard protocol without intravenous contrast. Multiplanar CT image reconstructions of the cervical spine were also generated. COMPARISON:  None. FINDINGS: CT HEAD FINDINGS Brain: No evidence of acute infarction, hemorrhage, hydrocephalus, extra-axial collection or mass lesion/mass effect. Mild age related cerebral atrophy. Vascular: No hyperdense vessel or unexpected calcification. Skull: Normal. Negative for fracture or focal lesion. Sinuses/Orbits: Near complete opacification of the left mastoid air cells. No acute orbital abnormality. Other: Moderate right posterior scalp hematoma. CT CERVICAL SPINE FINDINGS Alignment: Straightening of the normal cervical lordosis. No traumatic malalignment. Skull base and vertebrae: No acute fracture. No primary bone lesion or focal pathologic process. Soft tissues and spinal canal: No prevertebral fluid or swelling. No visible canal hematoma. Disc levels: Mild multilevel disc height loss and endplate spurring from G9-F6 through C7-T1. Scattered facet uncovertebral hypertrophy. Upper chest: Negative. Other: Partially visualized enteric tube. IMPRESSION: 1. No acute intracranial abnormality. Moderate right posterior scalp hematoma. 2. No acute cervical spine fracture. Mild-to-moderate multilevel degenerative changes. 3. Large left mastoid effusion. Electronically Signed   By: Titus Dubin M.D.   On: 05/05/2017 15:09   Ct Cervical Spine Wo Contrast  Result Date: 05/05/2017 CLINICAL DATA:  Syncope. EXAM: CT HEAD WITHOUT CONTRAST CT CERVICAL SPINE WITHOUT CONTRAST TECHNIQUE: Multidetector CT imaging of the head and cervical spine  was performed following the standard protocol without intravenous contrast. Multiplanar CT image reconstructions of the cervical spine were also generated. COMPARISON:  None. FINDINGS: CT HEAD FINDINGS Brain: No evidence of acute infarction, hemorrhage, hydrocephalus, extra-axial collection or mass lesion/mass effect. Mild age related cerebral atrophy. Vascular: No hyperdense vessel or unexpected calcification. Skull: Normal. Negative for fracture or focal lesion. Sinuses/Orbits: Near complete opacification of the left mastoid air cells. No acute orbital abnormality. Other: Moderate right posterior scalp hematoma. CT CERVICAL SPINE FINDINGS Alignment: Straightening of the normal cervical lordosis. No traumatic malalignment. Skull base and vertebrae: No acute fracture. No primary bone lesion or focal pathologic process. Soft tissues and spinal canal: No prevertebral fluid or swelling. No  visible canal hematoma. Disc levels: Mild multilevel disc height loss and endplate spurring from Y1-V4 through C7-T1. Scattered facet uncovertebral hypertrophy. Upper chest: Negative. Other: Partially visualized enteric tube. IMPRESSION: 1. No acute intracranial abnormality. Moderate right posterior scalp hematoma. 2. No acute cervical spine fracture. Mild-to-moderate multilevel degenerative changes. 3. Large left mastoid effusion. Electronically Signed   By: Titus Dubin M.D.   On: 05/05/2017 15:09   Dg Chest Portable 1 View  Result Date: 05/05/2017 CLINICAL DATA:  Syncope. EXAM: PORTABLE CHEST 1 VIEW COMPARISON:  Chest x-ray dated April 28, 2016. FINDINGS: Cardiomegaly. Pulmonary vascular congestion with increased basal predominant interstitial markings. No consolidation, pleural effusion, or pneumothorax. No acute osseous abnormality. Enteric tube in place with tip and distal side port in the gastric fundus. IMPRESSION: Cardiomegaly with pulmonary vascular congestion and interstitial edema. Electronically Signed   By:  Titus Dubin M.D.   On: 05/05/2017 15:19   Dg Abd Portable 1v  Result Date: 05/05/2017 CLINICAL DATA:  Check nasogastric catheter placement EXAM: PORTABLE ABDOMEN - 1 VIEW COMPARISON:  None. FINDINGS: Nasogastric catheter is noted extending into the stomach and coiling in the fundus. No obstructive changes are noted. Degenerative change of the lumbar spine is seen. IMPRESSION: Nasogastric catheter within stomach. Electronically Signed   By: Inez Catalina M.D.   On: 05/05/2017 15:20    Microbiology: Recent Results (from the past 240 hour(s))  MRSA PCR Screening     Status: None   Collection Time: 05/06/17 10:10 AM  Result Value Ref Range Status   MRSA by PCR NEGATIVE NEGATIVE Final    Comment:        The GeneXpert MRSA Assay (FDA approved for NASAL specimens only), is one component of a comprehensive MRSA colonization surveillance program. It is not intended to diagnose MRSA infection nor to guide or monitor treatment for MRSA infections.      Labs: Basic Metabolic Panel: Recent Labs  Lab 05/05/17 1428 05/06/17 0454 05/07/17 0825 05/08/17 0245 05/09/17 0328  NA 138 136 137 136 136  K 3.3* 3.4* 3.5 3.1* 3.4*  CL 102 102 105 101 101  CO2 24 24 25 26 26   GLUCOSE 227* 186* 175* 151* 161*  BUN 13 11 11 10 11   CREATININE 0.94 0.87 0.86 0.90 0.91  CALCIUM 8.6* 8.7* 8.3* 8.2* 8.6*  MG 2.2  --   --   --   --   PHOS 3.0  --   --   --   --    Liver Function Tests: Recent Labs  Lab 05/05/17 1428 05/06/17 0454  AST 83* 44*  ALT 71* 56  ALKPHOS 87 62  BILITOT 1.0 1.1  PROT 7.1 6.6  ALBUMIN 4.0 3.7   No results for input(s): LIPASE, AMYLASE in the last 168 hours. No results for input(s): AMMONIA in the last 168 hours. CBC: Recent Labs  Lab 05/05/17 1425 05/06/17 0454 05/08/17 0245 05/09/17 0328  WBC 7.4 10.4 7.7 7.1  NEUTROABS  --   --  5.4 4.8  HGB 15.7 14.6 14.0 14.4  HCT 45.8 41.5 41.6 42.2  MCV 87.9 87.4 88.9 88.8  PLT 156 156 142* 139*   Cardiac  Enzymes: Recent Labs  Lab 05/05/17 1428 05/05/17 1819 05/06/17 0044 05/06/17 0454  TROPONINI 0.03* 0.08* 0.08* 0.08*   BNP: BNP (last 3 results) Recent Labs    05/05/17 1819  BNP 139.2*    ProBNP (last 3 results) No results for input(s): PROBNP in the last 8760 hours.  CBG: No  results for input(s): GLUCAP in the last 168 hours.     Signed:  Alma Friendly, MD [facility] 05/11/2017, 6:40 PM

## 2017-05-11 NOTE — Telephone Encounter (Signed)
Christopher [name] from [facility] called stating that the pt was recently hospitalized. A PT eval was performed and the physical therapist recommended one additional visit but the patient declined. An OT referral was received but this has been canceled.  Blood pressure readings: Standing - 150/90 Seated - 150/90 The patient also stated that there were diagnoses on his history that were not true.. Diabetes and congestive heart failure.

## 2017-05-12 NOTE — Telephone Encounter (Signed)
Noted  

## 2017-05-14 ENCOUNTER — Encounter: Payer: Self-pay | Admitting: Internal Medicine

## 2017-05-14 ENCOUNTER — Ambulatory Visit (INDEPENDENT_AMBULATORY_CARE_PROVIDER_SITE_OTHER): Payer: Medicare Other | Admitting: Internal Medicine

## 2017-05-14 DIAGNOSIS — I4901 Ventricular fibrillation: Secondary | ICD-10-CM | POA: Diagnosis not present

## 2017-05-14 DIAGNOSIS — E119 Type 2 diabetes mellitus without complications: Secondary | ICD-10-CM

## 2017-05-14 DIAGNOSIS — F0781 Postconcussional syndrome: Secondary | ICD-10-CM | POA: Diagnosis not present

## 2017-05-14 DIAGNOSIS — S0003XA Contusion of scalp, initial encounter: Secondary | ICD-10-CM | POA: Diagnosis not present

## 2017-05-14 DIAGNOSIS — E118 Type 2 diabetes mellitus with unspecified complications: Secondary | ICD-10-CM | POA: Insufficient documentation

## 2017-05-14 NOTE — Progress Notes (Signed)
   Subjective:    Patient ID: Christopher [name], male    DOB: Nov 09, 1950, 66 y.o.   MRN: [id number]  HPI The patient is a 66 YO man coming in for hospital follow up (had cardiac arrest, defibrillation, cath with non-obstructive CAD and ICD implantation). Still suffering with some concussion since being home. Denies falls, memory change, confusion. Some slowness and dizziness with lying down. He is having mild head pain with intense concentration. He denies nausea or vomiting. Is eating well and drinking well. No alcohol since hospital. Denies abdominal pain or chest pains or SOB. Minimal memories from event and from the hospital.  [place], [facility], social history reviewed and updated.   Review of Systems  Constitutional: Positive for activity change. Negative for appetite change, chills, fatigue, fever and unexpected weight change.  HENT: Negative.   Eyes: Negative.   Respiratory: Negative for cough, chest tightness and shortness of breath.   Cardiovascular: Negative for chest pain, palpitations and leg swelling.  Gastrointestinal: Negative for abdominal distention, abdominal pain, constipation, diarrhea, nausea and vomiting.  Musculoskeletal: Negative.   Skin: Negative.   Neurological: Positive for headaches. Negative for dizziness, tremors and seizures.  Psychiatric/Behavioral: Negative.       Objective:   Physical Exam  Constitutional: He is oriented to person, place, and time. He appears well-developed and well-nourished.  HENT:  Head: Normocephalic and atraumatic.  Raised hematoma on the posterior left scalp with mild tenderness  Eyes: EOM are normal.  Neck: Normal range of motion.  Cardiovascular: Normal rate and regular rhythm.  Pulmonary/Chest: Effort normal and breath sounds normal. No respiratory distress. He has no wheezes. He has no rales.  Abdominal: Soft. Bowel sounds are normal. He exhibits no distension. There is no tenderness. There is no rebound.  Musculoskeletal: He exhibits  no edema.  Neurological: He is alert and oriented to person, place, and time. Coordination normal.  Skin: Skin is warm and dry.  Psychiatric: He has a normal mood and affect.   Vitals:   05/14/17 1417  BP: 140/80  Pulse: 74  Temp: 98 F (36.7 C)  TempSrc: Oral  SpO2: 98%  Weight: 255 lb (115.7 kg)  Height: 5\' 10"  (1.778 m)      Assessment & Plan:

## 2017-05-14 NOTE — Patient Instructions (Signed)
Do not drive for at least 6 months, keep the visit with the heart doctor.   We will see you back in 6 months. Work on losing about 25 pounds before the next visit.

## 2017-05-14 NOTE — Assessment & Plan Note (Signed)
Appears healing and less sensitive than previous with some decrease in size.

## 2017-05-14 NOTE — Assessment & Plan Note (Addendum)
ICD in place, no shocks since leaving the hospital. No identified trigger detected in the hospital. Will follow up with cardiology. Reminded and reiterated about no driving until 6 months from episode and he agrees.

## 2017-05-14 NOTE — Assessment & Plan Note (Signed)
Some dizziness and confusion regarding the hospital events. Talked about post-icu depression and mood disorders to be on the watch for to patient and his sister. Advised to rest and watch for red flag signs including nausea, vomiting, worsening headaches. CT head in the hospital without bleeding.

## 2017-05-14 NOTE — Assessment & Plan Note (Signed)
HgA1c 7.2 in the hospital and counseled today about weight gain. He declines medication today but wishes to work on weight loss. Recheck at next visit in about 6 months. Denies peripheral neuropathy or other known complications. Foot exam done today.

## 2017-05-20 ENCOUNTER — Ambulatory Visit (INDEPENDENT_AMBULATORY_CARE_PROVIDER_SITE_OTHER): Payer: Medicare Other | Admitting: *Deleted

## 2017-05-20 DIAGNOSIS — I4901 Ventricular fibrillation: Secondary | ICD-10-CM

## 2017-05-20 DIAGNOSIS — I469 Cardiac arrest, cause unspecified: Secondary | ICD-10-CM

## 2017-05-20 LAB — CUP PACEART INCLINIC DEVICE CHECK
Battery Remaining Longevity: 133 mo
Battery Voltage: 3.05 V
Brady Statistic RV Percent Paced: 0.44 %
Date Time Interrogation Session: 20181115160833
HighPow Impedance: 67 Ohm
Implantable Lead Implant Date: 20181102
Implantable Lead Location: 753860
Implantable Pulse Generator Implant Date: 20181102
Lead Channel Impedance Value: 437 Ohm
Lead Channel Impedance Value: 494 Ohm
Lead Channel Pacing Threshold Amplitude: 0.75 V
Lead Channel Pacing Threshold Pulse Width: 0.4 ms
Lead Channel Sensing Intrinsic Amplitude: 5.375 mV
Lead Channel Setting Pacing Amplitude: 3.5 V
Lead Channel Setting Pacing Pulse Width: 0.4 ms
Lead Channel Setting Sensing Sensitivity: 0.3 mV

## 2017-05-20 NOTE — Progress Notes (Signed)
Wound check appointment. Steri-strips removed. Wound without redness or edema. Incision edges approximated, wound well healed. Normal device function. Threshold, sensing, and impedance consistent with implant measurements. Device programmed at 3.5V for extra safety margin until 3 month visit. Histogram distribution appropriate for patient and level of activity. No ventricular arrhythmias noted. Patient educated about wound care, arm mobility, lifting restrictions, shock plan. ROV with AS 06/24/17, ROV with JA 08/09/17

## 2017-06-18 ENCOUNTER — Encounter: Payer: Self-pay | Admitting: Nurse Practitioner

## 2017-06-22 NOTE — Progress Notes (Signed)
Electrophysiology Office Note Date: 06/24/2017  ID:  Antonino, Nienhuis 29-Sep-1950, MRN [id number]  PCP: Hoyt Koch, MD Primary Cardiologist: Aundra Dubin Electrophysiologist: Allred  CC: VF arrest follow up  DONTAYE HUR is a 66 y.o. male seen today for Dr Rayann Heman.  He was admitted 05/2017 with resuscitated VF arrest.  Cath demonstrated no targets for revascularization and he underwent ICD implant prior to discharge.  He presents today for routine electrophysiology followup.  Since last being seen in our clinic, the patient reports doing very well. He is seen with his sister today. He does not remember anything about his arrest.  He denies chest pain, palpitations, dyspnea, PND, orthopnea, nausea, vomiting, dizziness, syncope, edema, weight gain, or early satiety.  Device History: MDT single chamber ICD implanted 2018 for VF arrest AAD History: none Appropriate Therapy: no  Past Medical History:  Diagnosis Date  . Arthritis   . Atrial fibrillation ([place])    post op, intol of anticoag  . Cardiac arrest [facility])    a. s/p MDT single chamber ICD  . Coronary artery disease    a. 2/7 Cath: LM nl, LAD min irregs, LCX min irregs, RI 40, RCA 83m, EF 55-60% basal to mid inf HK, 3-4+ MR;  b. 08/25/2012 PCI of RCA with 4.0x15 Vision BMS  . GERD (gastroesophageal reflux disease)    hx  . Hyperlipidemia    on statin  . Hypertension   . 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 Severe mitral regurgitation    a. Mitral valve prolapse with flail segment of posterior leaflet and severe MR by TEE, remote h/o bacterial endocarditis   . Sleep apnea    NPSG 01/21/06- AHI 40.7/hr cpap  . 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 AMPUTATION  07/28/2012   Procedure:  AMPUTATION DIGIT;  Surgeon: Colin Rhein, MD;  Location: [facility];  Service: Orthopedics;  Laterality: Right;  2nd toe  . CARDIAC CATHETERIZATION    . EP IMPLANTABLE DEVICE N/A 05/01/2016   Procedure: Loop Recorder Removal;  Surgeon: Thompson Grayer, MD;  Location: [place] CV LAB;  Service: Cardiovascular;  Laterality: N/A;  . FOOT SURGERY     BILATERAL TOES  . ICD IMPLANT N/A 05/07/2017   Procedure: ICD IMPLANT;  Surgeon: Constance Haw, MD;  Location: [place] CV LAB;  Service: Cardiovascular;  Laterality: N/A;  . Implantable loop recorder placement  03/31/13   MDT Linq implanted by Dr Rayann Heman to evaluate for further afib  . INTRAOPERATIVE TRANSESOPHAGEAL ECHOCARDIOGRAM N/A 09/27/2012   Procedure: INTRAOPERATIVE TRANSESOPHAGEAL ECHOCARDIOGRAM;  Surgeon: [name], MD;  Location: [place];  Service: Open Heart Surgery;  Laterality: N/A;  . LEFT AND RIGHT HEART CATHETERIZATION WITH CORONARY ANGIOGRAM N/A 08/12/2012   Procedure: LEFT AND RIGHT HEART CATHETERIZATION WITH CORONARY ANGIOGRAM;  Surgeon: [name], MD;  [facility] CATH LAB;  Service: Cardiovascular;  Laterality: N/A;  . LEFT HEART CATH AND CORONARY ANGIOGRAPHY N/A 05/06/2017   Procedure: LEFT HEART CATH AND CORONARY ANGIOGRAPHY;  Surgeon: [name], MD;  Location: [place] CV LAB;  Service: Cardiovascular;  Laterality: N/A;  . LOOP RECORDER IMPLANT N/A 03/31/2013   Procedure: LOOP RECORDER IMPLANT;  Surgeon: [name], MD;  Location: [place] CATH LAB;  Service: Cardiovascular;  Laterality: N/A;  . MITRAL VALVE REPAIR Right 09/27/2012   Procedure: MINIMALLY INVASIVE MITRAL VALVE REPAIR (MVR);  Surgeon: [name], MD;  Location: [place];  Service: Open Heart Surgery;  Laterality: Right;  Ultrasound guided  . PERCUTANEOUS CORONARY STENT INTERVENTION (PCI-S) N/A 08/25/2012   Procedure: PERCUTANEOUS CORONARY STENT INTERVENTION (PCI-S);  Surgeon: Sherren Mocha, MD;  Location: [facility] CATH LAB;  Service: Cardiovascular;   Laterality: N/A;  . SEPTOPLASTY     Dr. [name]  . TEE WITHOUT CARDIOVERSION N/A 08/12/2012   Procedure: TRANSESOPHAGEAL ECHOCARDIOGRAM (TEE);  Surgeon: [name], MD;  Location: [place];  Service: Cardiovascular;  Laterality: N/A;  . TONSILLECTOMY    . UVULOPALATOPHARYNGOPLASTY      Current Outpatient Medications  Medication Sig Dispense Refill  . amLODipine (NORVASC) 5 MG tablet TAKE 1 TABLET BY MOUTH  DAILY (Patient taking differently: TAKE 5 mg TABLET BY MOUTH  DAILY) 90 tablet 0  . aspirin EC 81 MG tablet Take 81 mg by mouth daily.    Marland Kitchen atorvastatin (LIPITOR) 20 MG tablet Take 1 tablet (20 mg total) by mouth daily. 30 tablet 0  . cetirizine (ZYRTEC) 10 MG tablet Take 10 mg by mouth daily.    . Cholecalciferol (VITAMIN D) 2000 UNITS CAPS Take 2,000 Units by mouth daily.     . fluticasone (FLONASE) 50 MCG/ACT nasal spray USE 2 SPRAYS IN EACH  NOSTRIL DAILY 48 g 0  . metoprolol succinate (TOPROL-XL) 100 MG 24 hr tablet TAKE 1 TABLET BY MOUTH  DAILY WITH OR IMMEDIATLEY  FOLLOWING A MEAL (Patient taking differently: Take 100 mg by mouth daily. TAKE 1 TABLET BY MOUTH  DAILY WITH OR IMMEDIATLEY  FOLLOWING A MEAL) 90 tablet 0  . ramipril (ALTACE) 10 MG capsule Take 1 capsule (10 mg total) by mouth daily. 90 capsule 0  . tamsulosin (FLOMAX) 0.4 MG CAPS capsule TAKE 1 CAPSULE BY MOUTH  DAILY (Patient taking differently: TAKE 0.4 mg CAPSULE BY MOUTH  DAILY) 90 capsule 3   No current facility-administered medications for this visit.     Allergies:   Codeine; Dust mite extract; Mold extract [trichophyton]; and Pollen extract   Social History: Social History   Socioeconomic History  . Marital status: Divorced    Spouse name: Not on file  . Number of children: Not on file  . Years of education: Not on file  . Highest education level: Not on file  Social Needs  . Financial resource strain: Not on file  . Food insecurity - worry: Not on file  . Food insecurity - inability: Not on  file  . Transportation needs - medical: Not on file  . Transportation needs - non-medical: Not on file  Occupational History  . Occupation: Works at [employer]  . Smoking status: Former Smoker    Packs/day: 2.50    Years: 5.00    Pack years: 12.50    Types: Cigarettes    Last attempt to quit: 07/06/1978    Years since quitting: 38.9  . Smokeless tobacco: Never Used  . Tobacco comment: social drinker  Substance and Sexual Activity  . Alcohol use: Yes    Comment: OCC.  . Drug use: No  . Sexual activity: Yes  Other Topics Concern  . Not on file  Social History Narrative   Works at [employer] - plans to retire 07/2015 after 36 years.   Divorced, lives alone   Supportive g-friend (shirlee moore)  Family History: Family History  Problem Relation Age of Onset  . Heart disease Father   . Colon cancer Father 28  . Diabetes Father   . Renal cancer Father   . Diabetes Mother   . Stroke Mother   . Sudden death Brother   . Heart attack Brother     Review of Systems: All other systems reviewed and are otherwise negative except as noted above.   Physical Exam: VS:  BP 122/86   Pulse 78   Ht 5\' 10"  (1.778 m)   Wt 259 lb 12.8 oz (117.8 kg)   SpO2 94%   BMI 37.28 kg/m  , BMI Body mass index is 37.28 kg/m. Wt Readings from Last 3 Encounters:  06/24/17 259 lb 12.8 oz (117.8 kg)  05/14/17 255 lb (115.7 kg)  05/07/17 253 lb 6.4 oz (114.9 kg)    GEN- The patient is obese appearing, alert and oriented x 3 today.   HEENT: normocephalic, atraumatic; sclera clear, conjunctiva pink; hearing intact; oropharynx clear; neck supple Lungs- Clear to ausculation bilaterally, normal work of breathing.  No wheezes, rales, rhonchi Heart- Regular rate and rhythm GI- soft, non-tender, non-distended, bowel sounds present Extremities- no clubbing, cyanosis, or edema MS- no significant deformity or atrophy Skin- warm and dry, no rash or lesion  Psych- euthymic mood, full  affect Neuro- strength and sensation are intact   EKG:  EKG is not ordered today.  Recent Labs: 05/05/2017: B Natriuretic Peptide 139.2; Magnesium 2.2; TSH 1.966 05/06/2017: ALT 56 05/09/2017: BUN 11; Creatinine, Ser 0.91; Hemoglobin 14.4; Platelets 139; Potassium 3.4; Sodium 136    Other studies Reviewed: Additional studies/ records that were reviewed today include: hospital records  Assessment and Plan: 1.  VF arrest Doing well post ICD implant No driving x6 months (01/6719) Keep K>3.9, Mg >1.8  2.  CAD No recent ischemic symptoms Continue current therapy  3.  HTN Stable No change required today    Current medicines are reviewed at length with the patient today.   The patient does not have concerns regarding his medicines.  The following changes were made today:  none  Labs/ tests ordered today include: none No orders of the defined types were placed in this encounter.    Disposition:   Follow up with Dr Rayann Heman as scheduled     Signed, Chanetta Marshall, NP 06/24/2017 11:45 AM   [facility] [address] [place] [address] [phone] (office) [phone] (fax)

## 2017-06-24 ENCOUNTER — Encounter: Payer: Self-pay | Admitting: Nurse Practitioner

## 2017-06-24 ENCOUNTER — Ambulatory Visit: Payer: Medicare Other | Admitting: Nurse Practitioner

## 2017-06-24 VITALS — BP 122/86 | HR 78 | Ht 70.0 in | Wt 259.8 lb

## 2017-06-24 DIAGNOSIS — I4901 Ventricular fibrillation: Secondary | ICD-10-CM | POA: Diagnosis not present

## 2017-06-24 DIAGNOSIS — I2510 Atherosclerotic heart disease of native coronary artery without angina pectoris: Secondary | ICD-10-CM | POA: Diagnosis not present

## 2017-06-24 DIAGNOSIS — I469 Cardiac arrest, cause unspecified: Secondary | ICD-10-CM

## 2017-06-24 DIAGNOSIS — I5022 Chronic systolic (congestive) heart failure: Secondary | ICD-10-CM | POA: Diagnosis not present

## 2017-06-24 DIAGNOSIS — I10 Essential (primary) hypertension: Secondary | ICD-10-CM | POA: Diagnosis not present

## 2017-06-24 NOTE — Patient Instructions (Signed)
Medication Instructions:   Your physician recommends that you continue on your current medications as directed. Please refer to the Current Medication list given to you today.    If you need a refill on your cardiac medications before your next appointment, please call your pharmacy.  Labwork: NONE ORDERED  TODAY    Testing/Procedures:  NONE ORDERED  TODAY     Follow-Up: AS SCHEDULED WITH DR ALLRED    Any Other Special Instructions Will Be Listed Below (If Applicable).                                                                                                                                                   

## 2017-06-25 LAB — CUP PACEART INCLINIC DEVICE CHECK
Date Time Interrogation Session: 20181221062507
Implantable Lead Implant Date: 20181102
Implantable Lead Location: 753860
Implantable Pulse Generator Implant Date: 20181102

## 2017-07-05 ENCOUNTER — Telehealth: Payer: Self-pay | Admitting: Internal Medicine

## 2017-07-05 NOTE — Telephone Encounter (Signed)
New Message  Whitney From [facility] call to verify if pt has heart failure. If so she will need pts ejection fraction. Please call back to discuss

## 2017-07-21 ENCOUNTER — Telehealth: Payer: Self-pay | Admitting: Internal Medicine

## 2017-07-21 NOTE — Telephone Encounter (Signed)
New message      Loree Fee from [facility] [phone] EXT [phone]  1) Are you calling to confirm a diagnosis or obtain personal health information (Y/N)? yes  2) If so, what information is requested? EF%  Please route to Medical Records or your medical records site representative

## 2017-07-22 NOTE — Telephone Encounter (Signed)
VM left with Whitney need Ef% form faxed over. Before records can be faxed.

## 2017-07-25 ENCOUNTER — Other Ambulatory Visit: Payer: Self-pay | Admitting: Internal Medicine

## 2017-07-25 DIAGNOSIS — I480 Paroxysmal atrial fibrillation: Secondary | ICD-10-CM

## 2017-08-09 ENCOUNTER — Ambulatory Visit: Payer: Medicare Other | Admitting: Internal Medicine

## 2017-08-09 ENCOUNTER — Encounter: Payer: Self-pay | Admitting: Internal Medicine

## 2017-08-09 VITALS — BP 120/70 | HR 71 | Ht 70.0 in | Wt 254.0 lb

## 2017-08-09 DIAGNOSIS — I10 Essential (primary) hypertension: Secondary | ICD-10-CM | POA: Diagnosis not present

## 2017-08-09 DIAGNOSIS — I4901 Ventricular fibrillation: Secondary | ICD-10-CM | POA: Diagnosis not present

## 2017-08-09 DIAGNOSIS — Z95810 Presence of automatic (implantable) cardiac defibrillator: Secondary | ICD-10-CM | POA: Diagnosis not present

## 2017-08-09 DIAGNOSIS — I469 Cardiac arrest, cause unspecified: Secondary | ICD-10-CM | POA: Diagnosis not present

## 2017-08-09 DIAGNOSIS — I2510 Atherosclerotic heart disease of native coronary artery without angina pectoris: Secondary | ICD-10-CM | POA: Diagnosis not present

## 2017-08-09 LAB — CUP PACEART INCLINIC DEVICE CHECK
Battery Remaining Longevity: 131 mo
Battery Voltage: 3.05 V
Brady Statistic RV Percent Paced: 0.29 %
Date Time Interrogation Session: 20190204145143
HighPow Impedance: 70 Ohm
Implantable Lead Implant Date: 20181102
Implantable Lead Location: 753860
Implantable Pulse Generator Implant Date: 20181102
Lead Channel Impedance Value: 494 Ohm
Lead Channel Impedance Value: 646 Ohm
Lead Channel Pacing Threshold Amplitude: 0.75 V
Lead Channel Pacing Threshold Pulse Width: 0.4 ms
Lead Channel Sensing Intrinsic Amplitude: 7.125 mV
Lead Channel Setting Pacing Amplitude: 2 V
Lead Channel Setting Pacing Pulse Width: 0.4 ms
Lead Channel Setting Sensing Sensitivity: 0.3 mV

## 2017-08-09 NOTE — Patient Instructions (Addendum)
Medication Instructions:  Your physician recommends that you continue on your current medications as directed. Please refer to the Current Medication list given to you today.  Labwork: None ordered.  Testing/Procedures: None ordered.  Follow-Up: Your physician wants you to follow-up in: 6 months with Chanetta Marshall, NP.   You will receive a reminder letter in the mail two months in advance. If you don't receive a letter, please call our office to schedule the follow-up appointment.  Remote monitoring is used to monitor your ICD from home. This monitoring reduces the number of office visits required to check your device to one time per year. It allows [place] to keep an eye on the functioning of your device to ensure it is working properly. You are scheduled for a device check from home on 11/08/2017. You may send your transmission at any time that day. If you have a wireless device, the transmission will be sent automatically. After your physician reviews your transmission, you will receive a postcard with your next transmission date.  Any Other Special Instructions Will Be Listed Below (If Applicable).  We are referring your for coronary artery disease management.  You will get a call from our office to set up an appointment.  If you need a refill on your cardiac medications before your next appointment, please call your pharmacy.

## 2017-08-09 NOTE — Progress Notes (Signed)
PCP: Hoyt Koch, MD Primary Cardiologist:  Previously Dr Aundra Dubin (will refer back to general cardiology today) Primary EP: Dr Ardyth Harps is a 67 y.o. male who presents today for routine electrophysiology followup.  Since last being seen in our clinic by EP NP, the patient reports doing reasonably well.  He remains frustrated with driving restrictions.  + SOB with moderate activity.  Today, he denies symptoms of palpitations, chest pain, lower extremity edema, dizziness, presyncope, syncope, or ICD shocks.  The patient is otherwise without complaint today.   Past Medical History:  Diagnosis Date  . Arthritis   . Atrial fibrillation ([place])    post op, intol of anticoag  . Cardiac arrest [facility])    a. s/p MDT single chamber ICD  . Coronary artery disease    a. 2/7 Cath: LM nl, LAD min irregs, LCX min irregs, RI 40, RCA 109m, EF 55-60% basal to mid inf HK, 3-4+ MR;  b. 08/25/2012 PCI of RCA with 4.0x15 Vision BMS  . GERD (gastroesophageal reflux disease)    hx  . Hyperlipidemia    on statin  . Hypertension   . 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 Severe mitral regurgitation    a. Mitral valve prolapse with flail segment of posterior leaflet and severe MR by TEE, remote h/o bacterial endocarditis   . Sleep apnea    NPSG 01/21/06- AHI 40.7/hr cpap  . 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 AMPUTATION  07/28/2012   Procedure: AMPUTATION DIGIT;  Surgeon: Colin Rhein, MD;  [facility];  Service: Orthopedics;  Laterality: Right;  2nd toe  . CARDIAC CATHETERIZATION    . EP IMPLANTABLE DEVICE N/A 05/01/2016   Procedure: Loop Recorder Removal;  Surgeon: Thompson Grayer, MD;  Location: [place] CV LAB;  Service: Cardiovascular;   Laterality: N/A;  . FOOT SURGERY     BILATERAL TOES  . ICD IMPLANT N/A 05/07/2017   Procedure: ICD IMPLANT;  Surgeon: Constance Haw, MD;  Location: [place] CV LAB;  Service: Cardiovascular;  Laterality: N/A;  . Implantable loop recorder placement  03/31/13   MDT Linq implanted by Dr Rayann Heman to evaluate for further afib  . INTRAOPERATIVE TRANSESOPHAGEAL ECHOCARDIOGRAM N/A 09/27/2012   Procedure: INTRAOPERATIVE TRANSESOPHAGEAL ECHOCARDIOGRAM;  Surgeon: [name], MD;  Location: [place];  Service: Open Heart Surgery;  Laterality: N/A;  . LEFT AND RIGHT HEART CATHETERIZATION WITH CORONARY ANGIOGRAM N/A 08/12/2012   Procedure: LEFT AND RIGHT HEART CATHETERIZATION WITH CORONARY ANGIOGRAM;  Surgeon: [name], MD;  Location: [facility] CATH LAB;  Service: Cardiovascular;  Laterality: N/A;  . LEFT HEART CATH AND CORONARY ANGIOGRAPHY N/A 05/06/2017   Procedure: LEFT HEART CATH AND CORONARY ANGIOGRAPHY;  Surgeon: [name], MD;  Location: [place] CV LAB;  Service: Cardiovascular;  Laterality: N/A;  . LOOP RECORDER IMPLANT N/A 03/31/2013   Procedure: LOOP RECORDER IMPLANT;  Surgeon: [name], MD;  Location: [place] CATH LAB;  Service: Cardiovascular;  Laterality: N/A;  . MITRAL VALVE REPAIR Right 09/27/2012   Procedure: MINIMALLY INVASIVE MITRAL VALVE REPAIR (MVR);  Surgeon: [name], MD;  Location: [place];  Service: Open Heart Surgery;  Laterality: Right;  Ultrasound guided  . PERCUTANEOUS CORONARY STENT INTERVENTION (PCI-S) N/A 08/25/2012  Procedure: PERCUTANEOUS CORONARY STENT INTERVENTION (PCI-S);  Surgeon: Sherren Mocha, MD;  Location: [facility] CATH LAB;  Service: Cardiovascular;  Laterality: N/A;  . SEPTOPLASTY     Dr. [name]  . TEE WITHOUT CARDIOVERSION N/A 08/12/2012   Procedure: TRANSESOPHAGEAL ECHOCARDIOGRAM (TEE);  Surgeon: [name], MD;  Location: [place];  Service: Cardiovascular;  Laterality: N/A;  . TONSILLECTOMY    . UVULOPALATOPHARYNGOPLASTY      ROS- all  systems are reviewed and negative except as per HPI above  Current Outpatient Medications  Medication Sig Dispense Refill  . amLODipine (NORVASC) 5 MG tablet TAKE 1 TABLET BY MOUTH  DAILY 90 tablet 3  . aspirin EC 81 MG tablet Take 81 mg by mouth daily.    Marland Kitchen atorvastatin (LIPITOR) 20 MG tablet Take 1 tablet (20 mg total) by mouth daily. 30 tablet 0  . cetirizine (ZYRTEC) 10 MG tablet Take 10 mg by mouth daily.    . Cholecalciferol (VITAMIN D) 2000 UNITS CAPS Take 2,000 Units by mouth daily.     . fluticasone (FLONASE) 50 MCG/ACT nasal spray USE 2 SPRAYS IN EACH  NOSTRIL DAILY 48 g 0  . metoprolol succinate (TOPROL-XL) 100 MG 24 hr tablet TAKE 1 TABLET BY MOUTH  DAILY WITH OR IMMEDIATLEY  FOLLOWING A MEAL (Patient taking differently: Take 100 mg by mouth daily. TAKE 1 TABLET BY MOUTH  DAILY WITH OR IMMEDIATLEY  FOLLOWING A MEAL) 90 tablet 0  . ramipril (ALTACE) 10 MG capsule Take 1 capsule (10 mg total) by mouth daily. 90 capsule 0  . tamsulosin (FLOMAX) 0.4 MG CAPS capsule TAKE 1 CAPSULE BY MOUTH  DAILY (Patient taking differently: TAKE 0.4 mg CAPSULE BY MOUTH  DAILY) 90 capsule 3   No current facility-administered medications for this visit.     Physical Exam: Vitals:   08/09/17 0958  Height: 5\' 10"  (1.778 m)    GEN- The patient is well appearing, alert and oriented x 3 today.   Head- normocephalic, atraumatic Eyes-  Sclera clear, conjunctiva pink Ears- hearing intact Oropharynx- clear Lungs- Clear to ausculation bilaterally, normal work of breathing Chest- ICD pocket is well healed Heart- Regular rate and rhythm, no murmurs, rubs or gallops, PMI not laterally displaced GI- soft, NT, ND, + BS Extremities- no clubbing, cyanosis, or edema  ICD interrogation- reviewed in detail today,  See PACEART report  ekg tracing ordered today is personally reviewed and shows sinus rhythm, RBBB, inferior infarct pattern ` Assessment and Plan:  1.  VF Arrest Stable on an appropriate medical  regimen Normal ICD function See Pace Art report No changes today No driving untile 5/63  2. CAD/ ischemic CM No ischemic symptoms No changes Refer to Sharman Cheek with ICM device clinic  3. HTN Stable No change required today  [facility] Refer to general cardiology Return to see EP NP in 6 months  Thompson Grayer MD, [facility] 08/09/2017 10:19 AM

## 2017-08-10 ENCOUNTER — Other Ambulatory Visit: Payer: Self-pay | Admitting: Cardiology

## 2017-08-10 ENCOUNTER — Other Ambulatory Visit: Payer: Self-pay | Admitting: Internal Medicine

## 2017-08-11 ENCOUNTER — Other Ambulatory Visit: Payer: Self-pay

## 2017-08-11 MED ORDER — FLUTICASONE PROPIONATE 50 MCG/ACT NA SUSP: 2.0000 | Freq: Every day | NASAL | 0 refills | Status: DC

## 2017-08-14 ENCOUNTER — Encounter: Payer: Self-pay | Admitting: Internal Medicine

## 2017-08-16 NOTE — Telephone Encounter (Signed)
Called pt and explained the alert tone and the reasons why he would hear the alert tone, explained to pt that if he hears this tone he needs to call the clinic. Also explained the shock plan to pt, pt stated that he had the magnet on his refrigerator and also the card with the shock plan.

## 2017-09-03 ENCOUNTER — Telehealth: Payer: Self-pay

## 2017-09-03 NOTE — Telephone Encounter (Signed)
Referred to ICM by Dr Rayann Heman. ICM intro given and patient agreed to monthly ICM calls.  Patient had questions about not being able to drive due to his device.  Explained DMV law.  He also had questions about how the device is able to help him and what happens when he is shocked.  Advised Dr End will also be able to answer any questions he has about his dx.  Patient has monitor at bedside and 1st ICM remote transmission scheduled for 09-13-2017.  Provided direct ICM number and encouraged to call.

## 2017-09-13 ENCOUNTER — Ambulatory Visit (INDEPENDENT_AMBULATORY_CARE_PROVIDER_SITE_OTHER): Payer: Medicare Other

## 2017-09-13 DIAGNOSIS — I5022 Chronic systolic (congestive) heart failure: Secondary | ICD-10-CM

## 2017-09-13 DIAGNOSIS — Z95810 Presence of automatic (implantable) cardiac defibrillator: Secondary | ICD-10-CM | POA: Diagnosis not present

## 2017-09-13 NOTE — Progress Notes (Signed)
EPIC Encounter for ICM Monitoring  Patient Name: Christopher [name] is a 67 y.o. male Date: 09/13/2017 Primary Care Physican: Hoyt Koch, MD Primary Cardiologist: End Electrophysiologist: Allred Dry Weight: 254 lbs last office visit      1st ICM Remote.  Heart Failure questions reviewed, pt does not have any fluid symptoms. He asked about benefits of drinking red wine and advised to discuss with Dr End.  He said he does not really like wine but if it would benefit his heart then he would try.  Advised that physicians recommend to avoid alcohol and if he does not like red wine then it is more beneficial not to start drinking it.  Advised there are other things to benefit his heart more than wine such as low salt diet and exercising as approved by physician. He is mentioned it is difficult to have restriction on driving and misses going to [facility].    Thoracic impedance normal.  No diuretic  Recommendations: Discuss how diet and fluid intake effects the heart. Advised to limit fluid intake to 64 oz daily.   Encouraged to call for fluid symptoms.  Follow-up plan: ICM clinic phone appointment on 10/14/2017.  Office appointment scheduled 09/27/2017 with Dr. Saunders Revel.  Copy of ICM check sent to Dr. Rayann Heman.   3 month ICM trend: 09/13/2017    1 Year ICM trend:       Rosalene Billings, RN 09/13/2017 11:16 AM

## 2017-09-17 ENCOUNTER — Encounter: Payer: Self-pay | Admitting: *Deleted

## 2017-09-27 ENCOUNTER — Encounter: Payer: Self-pay | Admitting: Internal Medicine

## 2017-09-27 ENCOUNTER — Ambulatory Visit: Payer: Medicare Other | Admitting: Internal Medicine

## 2017-09-27 VITALS — BP 120/78 | HR 69 | Ht 70.0 in | Wt 246.8 lb

## 2017-09-27 DIAGNOSIS — I10 Essential (primary) hypertension: Secondary | ICD-10-CM

## 2017-09-27 DIAGNOSIS — Z8674 Personal history of sudden cardiac arrest: Secondary | ICD-10-CM | POA: Diagnosis not present

## 2017-09-27 DIAGNOSIS — I428 Other cardiomyopathies: Secondary | ICD-10-CM | POA: Diagnosis not present

## 2017-09-27 DIAGNOSIS — I2510 Atherosclerotic heart disease of native coronary artery without angina pectoris: Secondary | ICD-10-CM

## 2017-09-27 DIAGNOSIS — I38 Endocarditis, valve unspecified: Secondary | ICD-10-CM | POA: Insufficient documentation

## 2017-09-27 DIAGNOSIS — I480 Paroxysmal atrial fibrillation: Secondary | ICD-10-CM | POA: Diagnosis not present

## 2017-09-27 DIAGNOSIS — E782 Mixed hyperlipidemia: Secondary | ICD-10-CM

## 2017-09-27 NOTE — Patient Instructions (Signed)
Medication Instructions:  Your physician recommends that you continue on your current medications as directed. Please refer to the Current Medication list given to you today.  -- If you need a refill on your cardiac medications before your next appointment, please call your pharmacy. --  Labwork: Fasting lipid panel (same time as ECHO)   Testing/Procedures:   Your physician has requested that you have an echocardiogram. Echocardiography is a painless test that uses sound waves to create images of your heart. It provides your doctor with information about the size and shape of your heart and how well your heart's chambers and valves are working. This procedure takes approximately one hour. There are no restrictions for this procedure.   Follow-Up: Your physician wants you to follow-up in: 54 MONTH with Dr. Saunders Revel.    You will receive a reminder letter in the mail two months in advance. If you don't receive a letter, please call our office to schedule the follow-up appointment.  Thank you for choosing [facility]!!    Any Other Special Instructions Will Be Listed Below (If Applicable).

## 2017-09-27 NOTE — Progress Notes (Signed)
Follow-up Outpatient Visit Date: 09/27/2017  Primary Care Provider: Hoyt Koch, MD [address] [place] [address]  Chief Complaint: Follow-up coronary artery disease and non-ischemic cardiomyopathy  HPI:  Christopher [name] is a 67 y.o. 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 He was previously followed in our office by Dr. Aundra Dubin, having lat been seen in 2016 by Richardson Dopp, PA. He has followed regularly since then with Dr. Rayann Heman in the EP clinic.  He was admitted to [facility] on 05/05/17 after a syncopal event while at [employer]. He was successfully defibrillated with ROSC and underwent ICD implantation. LHC during the admission showed non-obstructive CAD with moderately reduced LVEF.  Today, Christopher [name] reports feeling well. He was lightheaded for the first month following his cardiac arrest, which he attributes to a concussion. This has resolved. He denies further syncope, as well as ICD shocks, lightheadedness, chest pain, shortness of breath, edema, and orthopnea. He is tolerating his medications well. He is not on anticoagulation given spontaneous thigh hematoma while on rivaroxaban and subsequent loop recording showing no evidence of further atrial fibrillation. Etiology of cardiac arrest was never determined. He is not driving at this time (within 6 months of event).  --------------------------------------------------------------------------------------------------  Past Medical History:  Diagnosis Date  . Arthritis   . Atrial fibrillation ([place])    post op, intol of anticoag  . Cardiac arrest [facility])    a. s/p MDT single chamber ICD  . Coronary  artery disease    a. 2/7 Cath: LM nl, LAD min irregs, LCX min irregs, RI 40, RCA 40m, EF 55-60% basal to mid inf HK, 3-4+ MR;  b. 08/25/2012 PCI of RCA with 4.0x15 Vision BMS  . GERD (gastroesophageal reflux disease)    hx  . Hyperlipidemia    on statin  . Hypertension   . 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 Severe mitral regurgitation    a. Mitral valve prolapse with flail segment of posterior leaflet and severe MR by TEE, remote h/o bacterial endocarditis   . Sleep apnea    NPSG 01/21/06- AHI 40.7/hr cpap  . 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 AMPUTATION  07/28/2012   Procedure: AMPUTATION DIGIT;  Surgeon: Colin Rhein, MD;  Location: [facility];  Service: Orthopedics;  Laterality: Right;  2nd toe  . CARDIAC CATHETERIZATION    . EP IMPLANTABLE DEVICE N/A 05/01/2016   Procedure: Loop Recorder Removal;  Surgeon: Thompson Grayer, MD;  Location: [place] CV LAB;  Service: Cardiovascular;  Laterality: N/A;  . FOOT SURGERY     BILATERAL TOES  . ICD IMPLANT N/A 05/07/2017    Medtronic Visia AF MRI VR SureScan implanted by Dr Curt Bears following VF arrest  . Implantable loop recorder placement  03/31/13   MDT Linq implanted by Dr Rayann Heman to evaluate for further afib  . INTRAOPERATIVE TRANSESOPHAGEAL ECHOCARDIOGRAM N/A 09/27/2012   Procedure: INTRAOPERATIVE TRANSESOPHAGEAL ECHOCARDIOGRAM;  Surgeon: [name], MD;  Location: [place];  Service: Open Heart Surgery;  Laterality: N/A;  .  LEFT AND RIGHT HEART CATHETERIZATION WITH CORONARY ANGIOGRAM N/A 08/12/2012   Procedure: LEFT AND RIGHT HEART CATHETERIZATION WITH CORONARY ANGIOGRAM;  Surgeon: [name], MD;  Location: [facility] CATH LAB;  Service: Cardiovascular;  Laterality: N/A;  . LEFT HEART CATH AND CORONARY  ANGIOGRAPHY N/A 05/06/2017   Procedure: LEFT HEART CATH AND CORONARY ANGIOGRAPHY;  Surgeon: [name], MD;  Location: [place] CV LAB;  Service: Cardiovascular;  Laterality: N/A;  . LOOP RECORDER IMPLANT N/A 03/31/2013   Procedure: LOOP RECORDER IMPLANT;  Surgeon: [name], MD;  Location: [place] CATH LAB;  Service: Cardiovascular;  Laterality: N/A;  . MITRAL VALVE REPAIR Right 09/27/2012   Procedure: MINIMALLY INVASIVE MITRAL VALVE REPAIR (MVR);  Surgeon: [name], MD;  Location: [place];  Service: Open Heart Surgery;  Laterality: Right;  Ultrasound guided  . PERCUTANEOUS CORONARY STENT INTERVENTION (PCI-S) N/A 08/25/2012   Procedure: PERCUTANEOUS CORONARY STENT INTERVENTION (PCI-S);  Surgeon: Sherren Mocha, MD;  Location: [facility] CATH LAB;  Service: Cardiovascular;  Laterality: N/A;  . SEPTOPLASTY     Dr. [name]  . TEE WITHOUT CARDIOVERSION N/A 08/12/2012   Procedure: TRANSESOPHAGEAL ECHOCARDIOGRAM (TEE);  Surgeon: [name], MD;  Location: [place];  Service: Cardiovascular;  Laterality: N/A;  . TONSILLECTOMY    . UVULOPALATOPHARYNGOPLASTY      Current Meds  Medication Sig  . amLODipine (NORVASC) 5 MG tablet TAKE 1 TABLET BY MOUTH  DAILY  . aspirin EC 81 MG tablet Take 81 mg by mouth daily.  Marland Kitchen atorvastatin (LIPITOR) 20 MG tablet TAKE 1 TABLET BY MOUTH  DAILY  . cetirizine (ZYRTEC) 10 MG tablet Take 10 mg by mouth daily.  . Cholecalciferol (VITAMIN D) 2000 UNITS CAPS Take 2,000 Units by mouth daily.   . fluticasone (FLONASE) 50 MCG/ACT nasal spray Place 2 sprays into both nostrils daily.  . metoprolol succinate (TOPROL-XL) 100 MG 24 hr tablet TAKE 1 TABLET BY MOUTH  DAILY WITH OR IMMEDIATLEY  FOLLOWING A MEAL  . ramipril (ALTACE) 10 MG capsule TAKE 1 CAPSULE BY MOUTH  DAILY  . tamsulosin (FLOMAX) 0.4 MG CAPS capsule TAKE 1 CAPSULE BY MOUTH  DAILY    Allergies: Codeine; Dust mite extract; Mold extract [trichophyton]; and Pollen extract  Social History    Socioeconomic History  . Marital status: Divorced    Spouse name: Not on file  . Number of children: Not on file  . Years of education: Not on file  . Highest education level: Not on file  Occupational History  . Occupation: Works at [employer]  . Financial resource strain: Not on file  . Food insecurity:    Worry: Not on file    Inability: Not on file  . Transportation needs:    Medical: Not on file    Non-medical: Not on file  Tobacco Use  . Smoking status: Former Smoker    Packs/day: 2.50    Years: 5.00    Pack years: 12.50    Types: Cigarettes    Last attempt to quit: 07/06/1978    Years since quitting: 39.2  . Smokeless tobacco: Never Used  . Tobacco comment: social drinker  Substance and Sexual Activity  . Alcohol use: Yes    Comment: OCC.  . Drug use: No  . Sexual activity: Yes  Lifestyle  . Physical activity:    Days per week: Not on file    Minutes per session: Not on file  . Stress: Not on file  Relationships  .  Social connections:    Talks on phone: Not on file    Gets together: Not on file    Attends religious service: Not on file    Active member of club or organization: Not on file    Attends meetings of clubs or organizations: Not on file    Relationship status: Not on file  . Intimate partner violence:    Fear of current or ex partner: Not on file    Emotionally abused: Not on file    Physically abused: Not on file    Forced sexual activity: Not on file  Other Topics Concern  . Not on file  Social History Narrative   Works at [employer] - plans to retire 07/2015 after 36 years.   Divorced, lives alone   Supportive g-friend (shirlee moore)          Family History  Problem Relation Age of Onset  . Heart disease Father   . Colon cancer Father 63  . Diabetes Father   . Renal cancer Father   . Diabetes Mother   . Stroke Mother   . Sudden death Brother   . Heart attack Brother     Review of Systems: A 12-system review of  systems was performed and was negative except as noted in the HPI.  --------------------------------------------------------------------------------------------------  Physical Exam: BP 120/78   Pulse 69   Ht 5\' 10"  (1.778 m)   Wt 246 lb 12.8 oz (111.9 kg)   SpO2 97%   BMI 35.41 kg/m   General:  NAD. Accompanied by his sister. HEENT: No conjunctival pallor or scleral icterus. Moist mucous membranes.  OP clear. Neck: Supple without lymphadenopathy, thyromegaly, JVD, or HJR. No carotid bruit. Lungs: Normal work of breathing. Clear to auscultation bilaterally without wheezes or crackles. Heart: Regular rate and rhythm without murmurs, rubs, or gallops. Non-displaced PMI. Abd: Bowel sounds present. Soft, NT/ND without hepatosplenomegaly Ext: No lower extremity edema. Radial, PT, and DP pulses are 2+ bilaterally. Skin: Warm and dry without rash.  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 No significant change from prior tracings as recently as 08/09/17.  Lab Results  Component Value Date   WBC 7.1 05/09/2017   HGB 14.4 05/09/2017   HCT 42.2 05/09/2017   MCV 88.8 05/09/2017   PLT 139 (L) 05/09/2017    Lab Results  Component Value Date   NA 136 05/09/2017   K 3.4 (L) 05/09/2017   CL 101 05/09/2017   CO2 26 05/09/2017   BUN 11 05/09/2017   CREATININE 0.91 05/09/2017   GLUCOSE 161 (H) 05/09/2017   ALT 56 05/06/2017    Lab Results  Component Value Date   CHOL 134 05/08/2017   HDL 28 (L) 05/08/2017   LDLCALC UNABLE TO CALCULATE IF TRIGLYCERIDE OVER 400 mg/dL 05/08/2017   LDLDIRECT 65.0 05/11/2016   TRIG 420 (H) 05/08/2017   CHOLHDL 4.8 05/08/2017   --------------------------------------------------------------------------------------------------  ASSESSMENT AND PLAN: Coronary artery disease without angina No chest pain. Continue secondary prevention with ASA, metoprolol, and atorvastatin.  NICM Moderately reduced LVEF immediately following cardiac arrest last  fall. Christopher [name] appears euvolemic but has been fairly sedentary since his event in October. We will repeat an echo to evaluate for interval improvement in LVEF. We will continue current doses of metoprolol and ramipril.  Mitral regurgitation status post repair No murmur on exam. Christopher [name] appears euvolemic on exam. Continue aspirin. Reinforced prophylactic antibiotics for any dental procedures.  Hyperlipidemia Most recent lipid panel in 05/2017  showed marked hypertriglyceridemia, low HDL, and LDL that could not be calculated. We will repeat a fasting lipid panel when Christopher [name] returns for his echo.  Cardiac arrest Etiology still unclear. Christopher [name] is now s/p ICD and has not had any further events or shocks. We will repeat echo to reassess LVEF. Continue metoprolol. Follow-up with EP and device clinic as previously arranged.  Paroxysmal atrial fibrillation Noted post-op following mitral valve surgery. No recurrence noted on ILR after surgery. Christopher [name] is off anticoagulation due to thigh hematoma. Continue follow-up with EP.  Essential hypertension BP adequately controlled. No medication changes today.  Follow-up: Return to clinic in 6 months.  Nelva Bush, MD 09/27/2017 2:09 PM

## 2017-10-04 ENCOUNTER — Ambulatory Visit (HOSPITAL_COMMUNITY): Payer: Medicare Other | Attending: Internal Medicine

## 2017-10-04 ENCOUNTER — Other Ambulatory Visit: Payer: Medicare Other | Admitting: *Deleted

## 2017-10-04 ENCOUNTER — Telehealth: Payer: Self-pay

## 2017-10-04 ENCOUNTER — Other Ambulatory Visit: Payer: Self-pay

## 2017-10-04 DIAGNOSIS — E782 Mixed hyperlipidemia: Secondary | ICD-10-CM

## 2017-10-04 DIAGNOSIS — I059 Rheumatic mitral valve disease, unspecified: Secondary | ICD-10-CM | POA: Diagnosis not present

## 2017-10-04 DIAGNOSIS — I119 Hypertensive heart disease without heart failure: Secondary | ICD-10-CM | POA: Insufficient documentation

## 2017-10-04 DIAGNOSIS — I2510 Atherosclerotic heart disease of native coronary artery without angina pectoris: Secondary | ICD-10-CM | POA: Diagnosis not present

## 2017-10-04 DIAGNOSIS — I428 Other cardiomyopathies: Secondary | ICD-10-CM | POA: Diagnosis not present

## 2017-10-04 DIAGNOSIS — Z8674 Personal history of sudden cardiac arrest: Secondary | ICD-10-CM | POA: Insufficient documentation

## 2017-10-04 DIAGNOSIS — I38 Endocarditis, valve unspecified: Secondary | ICD-10-CM | POA: Diagnosis not present

## 2017-10-04 DIAGNOSIS — I480 Paroxysmal atrial fibrillation: Secondary | ICD-10-CM | POA: Diagnosis not present

## 2017-10-04 DIAGNOSIS — E785 Hyperlipidemia, unspecified: Secondary | ICD-10-CM | POA: Insufficient documentation

## 2017-10-04 LAB — LIPID PANEL
Chol/HDL Ratio: 4.6 ratio (ref 0.0–5.0)
Cholesterol, Total: 134 mg/dL (ref 100–199)
HDL: 29 mg/dL — ABNORMAL LOW (ref >39)
Triglycerides: 525 mg/dL — ABNORMAL HIGH (ref 0–149)

## 2017-10-04 NOTE — Telephone Encounter (Signed)
Please check out triglycerides.. Thank you.Marland Kitchen

## 2017-10-05 ENCOUNTER — Other Ambulatory Visit: Payer: Self-pay

## 2017-10-05 DIAGNOSIS — E782 Mixed hyperlipidemia: Secondary | ICD-10-CM

## 2017-10-06 LAB — SPECIMEN STATUS REPORT

## 2017-10-06 LAB — LDL CHOLESTEROL, DIRECT: LDL Direct: 42 mg/dL (ref 0–99)

## 2017-10-07 ENCOUNTER — Other Ambulatory Visit: Payer: Self-pay

## 2017-10-07 DIAGNOSIS — E785 Hyperlipidemia, unspecified: Secondary | ICD-10-CM

## 2017-10-14 ENCOUNTER — Ambulatory Visit (INDEPENDENT_AMBULATORY_CARE_PROVIDER_SITE_OTHER): Payer: Medicare Other

## 2017-10-14 DIAGNOSIS — I5022 Chronic systolic (congestive) heart failure: Secondary | ICD-10-CM

## 2017-10-14 DIAGNOSIS — Z95810 Presence of automatic (implantable) cardiac defibrillator: Secondary | ICD-10-CM | POA: Diagnosis not present

## 2017-10-15 NOTE — Progress Notes (Signed)
EPIC Encounter for ICM Monitoring  Patient Name: Christopher [name] is a 67 y.o. male Date: 10/15/2017 Primary Care Physican: Hoyt Koch, MD Primary Cardiologist: End Electrophysiologist: Allred Dry Weight: 236 lbs (weighs daily)       Heart Failure questions reviewed, pt asymptomatic.  Has [employer] scales and weighs daily.    Thoracic impedance normal.  No diuretic  Recommendations: No changes.  Encouraged to call for fluid symptoms.  Follow-up plan: ICM clinic phone appointment on 11/15/2017.   Copy of ICM check sent to Dr. Rayann Heman.   3 month ICM trend: 10/14/2017    1 Year ICM trend:       Rosalene Billings, RN 10/15/2017 11:53 AM

## 2017-11-06 ENCOUNTER — Other Ambulatory Visit: Payer: Self-pay | Admitting: Internal Medicine

## 2017-11-08 ENCOUNTER — Ambulatory Visit (INDEPENDENT_AMBULATORY_CARE_PROVIDER_SITE_OTHER): Payer: Medicare Other | Admitting: *Deleted

## 2017-11-08 DIAGNOSIS — I428 Other cardiomyopathies: Secondary | ICD-10-CM

## 2017-11-09 ENCOUNTER — Telehealth: Payer: Self-pay | Admitting: Cardiology

## 2017-11-09 NOTE — Telephone Encounter (Signed)
Spoke with pt and reminded pt of remote transmission that is due today. Pt verbalized understanding.   

## 2017-11-10 ENCOUNTER — Encounter: Payer: Self-pay | Admitting: Cardiology

## 2017-11-10 NOTE — Progress Notes (Signed)
Remote ICD transmission.   

## 2017-11-11 ENCOUNTER — Encounter: Payer: Self-pay | Admitting: Internal Medicine

## 2017-11-11 ENCOUNTER — Other Ambulatory Visit (INDEPENDENT_AMBULATORY_CARE_PROVIDER_SITE_OTHER): Payer: Medicare Other

## 2017-11-11 ENCOUNTER — Ambulatory Visit (INDEPENDENT_AMBULATORY_CARE_PROVIDER_SITE_OTHER): Payer: Medicare Other | Admitting: Internal Medicine

## 2017-11-11 VITALS — BP 132/84 | HR 74 | Temp 98.1°F | Ht 70.0 in | Wt 236.0 lb

## 2017-11-11 DIAGNOSIS — E119 Type 2 diabetes mellitus without complications: Secondary | ICD-10-CM

## 2017-11-11 LAB — HEMOGLOBIN A1C: Hgb A1c MFr Bld: 12.7 % — ABNORMAL HIGH (ref 4.6–6.5)

## 2017-11-11 NOTE — Progress Notes (Signed)
   Subjective:    Patient ID: Christopher [name], male    DOB: May 12, 1951, 67 y.o.   MRN: [id number]  HPI The patient is a 67 YO man coming in for follow up of his diabetes. He was diagnosed with this in the hospital. He has not needed medications as of yet for the diabetes. He has not been working on diet or exercise much since that time. He has recently started driving again as he ha snot had any more episodes of passing out. He has not had any heart problems on loop recorder in that time. He denies numbness or burning pains in his feet. Denies chest pains or SOB or abdominal pain. Concussion symptoms are now fully resolved. They took about 1 month after leaving the hospital.   Review of Systems  Constitutional: Negative.   HENT: Negative.   Eyes: Negative.   Respiratory: Negative for cough, chest tightness and shortness of breath.   Cardiovascular: Negative for chest pain, palpitations and leg swelling.  Gastrointestinal: Negative for abdominal distention, abdominal pain, constipation, diarrhea, nausea and vomiting.  Musculoskeletal: Negative.   Skin: Negative.   Neurological: Negative.   Psychiatric/Behavioral: Negative.       Objective:   Physical Exam  Constitutional: He is oriented to person, place, and time. He appears well-developed and well-nourished.  HENT:  Head: Normocephalic and atraumatic.  Eyes: EOM are normal.  Neck: Normal range of motion.  Cardiovascular: Normal rate and regular rhythm.  Pulmonary/Chest: Effort normal and breath sounds normal. No respiratory distress. He has no wheezes. He has no rales.  Abdominal: Soft. Bowel sounds are normal. He exhibits no distension. There is no tenderness. There is no rebound.  Musculoskeletal: He exhibits no edema.  Neurological: He is alert and oriented to person, place, and time. Coordination normal.  Skin: Skin is warm and dry.  Psychiatric: He has a normal mood and affect.   Vitals:   11/11/17 1318  BP: 132/84  Pulse: 74   Temp: 98.1 F (36.7 C)  TempSrc: Oral  SpO2: 97%  Weight: 236 lb (107 kg)  Height: 5\' 10"  (1.778 m)      Assessment & Plan:

## 2017-11-11 NOTE — Patient Instructions (Addendum)
We will check the sugar levels today and call you back about the results.    Diabetes Mellitus and Exercise Exercising regularly is important for your overall health, especially when you have diabetes (diabetes mellitus). Exercising is not only about losing weight. It has many health benefits, such as increasing muscle strength and bone density and reducing body fat and stress. This leads to improved fitness, flexibility, and endurance, all of which result in better overall health. Exercise has additional benefits for people with diabetes, including:  Reducing appetite.  Helping to lower and control blood glucose.  Lowering blood pressure.  Helping to control amounts of fatty substances (lipids) in the blood, such as cholesterol and triglycerides.  Helping the body to respond better to insulin (improving insulin sensitivity).  Reducing how much insulin the body needs.  Decreasing the risk for heart disease by: ? Lowering cholesterol and triglyceride levels. ? Increasing the levels of good cholesterol. ? Lowering blood glucose levels.  What is my activity plan? Your health care provider or certified diabetes educator can help you make a plan for the type and frequency of exercise (activity plan) that works for you. Make sure that you:  Do at least 150 minutes of moderate-intensity or vigorous-intensity exercise each week. This could be brisk walking, biking, or water aerobics. ? Do stretching and strength exercises, such as yoga or weightlifting, at least 2 times a week. ? Spread out your activity over at least 3 days of the week.  Get some form of physical activity every day. ? Do not go more than 2 days in a row without some kind of physical activity. ? Avoid being inactive for more than 90 minutes at a time. Take frequent breaks to walk or stretch.  Choose a type of exercise or activity that you enjoy, and set realistic goals.  Start slowly, and gradually increase the intensity  of your exercise over time.  What do I need to know about managing my diabetes?  Check your blood glucose before and after exercising. ? If your blood glucose is higher than 240 mg/dL (13.3 mmol/L) before you exercise, check your urine for ketones. If you have ketones in your urine, do not exercise until your blood glucose returns to normal.  Know the symptoms of low blood glucose (hypoglycemia) and how to treat it. Your risk for hypoglycemia increases during and after exercise. Common symptoms of hypoglycemia can include: ? Hunger. ? Anxiety. ? Sweating and feeling clammy. ? Confusion. ? Dizziness or feeling light-headed. ? Increased heart rate or palpitations. ? Blurry vision. ? Tingling or numbness around the mouth, lips, or tongue. ? Tremors or shakes. ? Irritability.  Keep a rapid-acting carbohydrate snack available before, during, and after exercise to help prevent or treat hypoglycemia.  Avoid injecting insulin into areas of the body that are going to be exercised. For example, avoid injecting insulin into: ? The arms, when playing tennis. ? The legs, when jogging.  Keep records of your exercise habits. Doing this can help you and your health care provider adjust your diabetes management plan as needed. Write down: ? Food that you eat before and after you exercise. ? Blood glucose levels before and after you exercise. ? The type and amount of exercise you have done. ? When your insulin is expected to peak, if you use insulin. Avoid exercising at times when your insulin is peaking.  When you start a new exercise or activity, work with your health care provider to make sure the  activity is safe for you, and to adjust your insulin, medicines, or food intake as needed.  Drink plenty of water while you exercise to prevent dehydration or heat stroke. Drink enough fluid to keep your urine clear or pale yellow. This information is not intended to replace advice given to you by your  health care provider. Make sure you discuss any questions you have with your health care provider. Document Released: 09/12/2003 Document Revised: 01/10/2016 Document Reviewed: 12/02/2015 Elsevier Interactive Patient Education  2018 [employer].

## 2017-11-12 NOTE — Assessment & Plan Note (Addendum)
Has not been on medications. Diet has not been great and not active in the last 6 months or so as he has been recovering. Checking HgA1c and adjust as needed. Counseled about eye exam. On ACE-I and statin.

## 2017-11-15 ENCOUNTER — Ambulatory Visit (INDEPENDENT_AMBULATORY_CARE_PROVIDER_SITE_OTHER): Payer: Medicare Other

## 2017-11-15 DIAGNOSIS — I5022 Chronic systolic (congestive) heart failure: Secondary | ICD-10-CM

## 2017-11-15 DIAGNOSIS — Z95810 Presence of automatic (implantable) cardiac defibrillator: Secondary | ICD-10-CM | POA: Diagnosis not present

## 2017-11-16 ENCOUNTER — Other Ambulatory Visit: Payer: Self-pay | Admitting: Internal Medicine

## 2017-11-16 MED ORDER — METFORMIN HCL 500 MG PO TABS: 500.0000 mg | ORAL_TABLET | Freq: Two times a day (BID) | ORAL | 3 refills | Status: DC

## 2017-11-16 NOTE — Progress Notes (Signed)
EPIC Encounter for ICM Monitoring  Patient Name: Christopher [name] is a 67 y.o. male Date: 11/16/2017 Primary Care Physican: Hoyt Koch, MD Primary Cardiologist:End Electrophysiologist:Allred Dry Weight:230lbs (weighs daily)       Heart Failure questions reviewed, pt asymptomatic.  He was recently diagnosed with diabetes and starting medication.  He is going to gym 3 times a week.    Thoracic impedance normal.  No diuretic  Recommendations: No changes.    Encouraged to call for fluid symptoms.  Follow-up plan: ICM clinic phone appointment on 12/16/2017.    Copy of ICM check sent to Dr. Rayann Heman.   3 month ICM trend: 11/15/2017    1 Year ICM trend:       Rosalene Billings, RN 11/16/2017 10:52 AM

## 2017-11-26 LAB — CUP PACEART REMOTE DEVICE CHECK
Battery Remaining Longevity: 129 mo
Battery Voltage: 3.03 V
Brady Statistic RV Percent Paced: 0.37 %
Date Time Interrogation Session: 20190507180148
HighPow Impedance: 77 Ohm
Implantable Lead Implant Date: 20181102
Implantable Lead Location: 753860
Implantable Pulse Generator Implant Date: 20181102
Lead Channel Impedance Value: 570 Ohm
Lead Channel Impedance Value: 703 Ohm
Lead Channel Pacing Threshold Amplitude: 0.625 V
Lead Channel Pacing Threshold Pulse Width: 0.4 ms
Lead Channel Sensing Intrinsic Amplitude: 6.75 mV
Lead Channel Sensing Intrinsic Amplitude: 6.75 mV
Lead Channel Setting Pacing Amplitude: 2 V
Lead Channel Setting Pacing Pulse Width: 0.4 ms
Lead Channel Setting Sensing Sensitivity: 0.3 mV

## 2017-12-01 ENCOUNTER — Encounter: Payer: Self-pay | Admitting: Internal Medicine

## 2017-12-02 ENCOUNTER — Telehealth: Payer: Self-pay | Admitting: Emergency Medicine

## 2017-12-02 NOTE — Telephone Encounter (Signed)
Called patient to schedule AWV. Patient declined at this time. 

## 2017-12-06 ENCOUNTER — Encounter: Payer: Self-pay | Admitting: Internal Medicine

## 2017-12-16 ENCOUNTER — Ambulatory Visit (INDEPENDENT_AMBULATORY_CARE_PROVIDER_SITE_OTHER): Payer: Medicare Other

## 2017-12-16 DIAGNOSIS — Z95810 Presence of automatic (implantable) cardiac defibrillator: Secondary | ICD-10-CM | POA: Diagnosis not present

## 2017-12-16 DIAGNOSIS — I5022 Chronic systolic (congestive) heart failure: Secondary | ICD-10-CM | POA: Diagnosis not present

## 2017-12-16 NOTE — Progress Notes (Signed)
EPIC Encounter for ICM Monitoring  Patient Name: Christopher [name] is a 67 y.o. male Date: 12/16/2017 Primary Care Physican: Hoyt Koch, MD Primary Cardiologist:End Electrophysiologist:Allred Dry Weight:229lbs (weighs daily)     Heart Failure questions reviewed, pt asymptomatic and reviewed fluid symptoms to report.  He was diagnosed with diabetes. A1C was 12.7 on 5/9.  Discussed diet.    Thoracic impedance abnormal suggesting fluid accumulation since 11/20/2017.  No diuretic  Recommendations: No changes.  Reviewed dietary restriction of salt and he is now on diabetic diet.  Encouraged him to review food labels for salt amount and limit to 2000 mg a day.  He eats out at restaurants on weekends but has cut back on that.  Encouraged to call for fluid symptoms.  Follow-up plan: ICM clinic phone appointment on 12/27/2017 to recheck fluid levels.    Copy of ICM check sent to Dr. Rayann Heman and Dr. Saunders Revel.   3 month ICM trend: 12/16/2017    1 Year ICM trend:       Rosalene Billings, RN 12/16/2017 2:06 PM

## 2017-12-20 DIAGNOSIS — H0102B Squamous blepharitis left eye, upper and lower eyelids: Secondary | ICD-10-CM | POA: Diagnosis not present

## 2017-12-20 DIAGNOSIS — H0102A Squamous blepharitis right eye, upper and lower eyelids: Secondary | ICD-10-CM | POA: Diagnosis not present

## 2017-12-20 LAB — HM DIABETES EYE EXAM

## 2017-12-25 ENCOUNTER — Other Ambulatory Visit: Payer: Self-pay | Admitting: Internal Medicine

## 2017-12-27 ENCOUNTER — Ambulatory Visit (INDEPENDENT_AMBULATORY_CARE_PROVIDER_SITE_OTHER): Payer: Self-pay

## 2017-12-27 DIAGNOSIS — I5022 Chronic systolic (congestive) heart failure: Secondary | ICD-10-CM

## 2017-12-27 DIAGNOSIS — Z95810 Presence of automatic (implantable) cardiac defibrillator: Secondary | ICD-10-CM

## 2017-12-27 NOTE — Progress Notes (Signed)
EPIC Encounter for ICM Monitoring  Patient Name: Christopher [name] is a 67 y.o. male Date: 12/27/2017 Primary Care Physican: Hoyt Koch, MD Primary Cardiologist:End Electrophysiologist:Allred Dry Weight:Previous weight 230lbs  Heart Failure questions reviewed, pt asymptomatic.   Thoracic impedance abnormal suggesting fluid accumulation starting 11/22/2017 and fluid index rising > threshold 12/07/2017.  No diuretic  Recommendations: No changes.  Reinforced fluid restriction and sodium restriction.  Encouraged to call for fluid symptoms.  Follow-up plan: ICM clinic phone appointment on 01/07/2018 to recheck fluid levels.  Office appointment scheduled 02/04/2018 with Chanetta Marshall, NP and appt 03/18/2018 with Dr. Saunders Revel.  Copy of ICM check sent to Dr. Rayann Heman and Dr. Saunders Revel for review and if any recommendations will call him back.   3 month ICM trend: 12/27/2017    1 Year ICM trend:       Rosalene Billings, RN 12/27/2017 11:30 AM

## 2018-01-07 ENCOUNTER — Ambulatory Visit (INDEPENDENT_AMBULATORY_CARE_PROVIDER_SITE_OTHER): Payer: Self-pay

## 2018-01-07 DIAGNOSIS — I5022 Chronic systolic (congestive) heart failure: Secondary | ICD-10-CM

## 2018-01-07 DIAGNOSIS — Z95810 Presence of automatic (implantable) cardiac defibrillator: Secondary | ICD-10-CM

## 2018-01-07 NOTE — Progress Notes (Signed)
EPIC Encounter for ICM Monitoring  Patient Name: Christopher [name] is a 67 y.o. male Date: 01/07/2018 Primary Care Physican: Hoyt Koch, MD Primary Cardiologist:End Electrophysiologist:Allred Dry Weight: 227lbs       Heart Failure questions reviewed, pt asymptomatic.  He is doing some exercise.    Thoracic impedance improved and is close to baseline.   No diuretic  Recommendations: No changes.  Encouraged to call for fluid symptoms.  Follow-up plan: ICM clinic phone appointment on 03/17/2018.    Appointments: Office appointment scheduled 02/04/2018 with Chanetta Marshall, NP and appt 03/18/2018 with Dr. Saunders Revel.  Copy of ICM check sent to Dr. Rayann Heman.   3 month ICM trend: 01/07/2018    1 Year ICM trend:       Rosalene Billings, RN 01/07/2018 2:34 PM

## 2018-01-10 ENCOUNTER — Other Ambulatory Visit: Payer: Medicare Other | Admitting: *Deleted

## 2018-01-10 DIAGNOSIS — E785 Hyperlipidemia, unspecified: Secondary | ICD-10-CM | POA: Diagnosis not present

## 2018-01-10 LAB — LIPID PANEL
Chol/HDL Ratio: 3.6 ratio (ref 0.0–5.0)
Cholesterol, Total: 115 mg/dL (ref 100–199)
HDL: 32 mg/dL — ABNORMAL LOW (ref >39)
LDL Calculated: 52 mg/dL (ref 0–99)
Triglycerides: 154 mg/dL — ABNORMAL HIGH (ref 0–149)
VLDL Cholesterol Cal: 31 mg/dL (ref 5–40)

## 2018-01-13 ENCOUNTER — Ambulatory Visit (INDEPENDENT_AMBULATORY_CARE_PROVIDER_SITE_OTHER): Payer: Medicare Other | Admitting: Internal Medicine

## 2018-01-13 ENCOUNTER — Other Ambulatory Visit (INDEPENDENT_AMBULATORY_CARE_PROVIDER_SITE_OTHER): Payer: Medicare Other

## 2018-01-13 ENCOUNTER — Encounter: Payer: Self-pay | Admitting: Internal Medicine

## 2018-01-13 VITALS — BP 126/78 | HR 65 | Temp 98.3°F | Ht 70.0 in | Wt 229.0 lb

## 2018-01-13 DIAGNOSIS — E119 Type 2 diabetes mellitus without complications: Secondary | ICD-10-CM

## 2018-01-13 LAB — HEMOGLOBIN A1C: Hgb A1c MFr Bld: 8 % — ABNORMAL HIGH (ref 4.6–6.5)

## 2018-01-13 NOTE — Patient Instructions (Signed)
We will check the sugar levels today.   

## 2018-01-13 NOTE — Progress Notes (Unsigned)
Abstracted and sent to scan  

## 2018-01-13 NOTE — Progress Notes (Signed)
   Subjective:    Patient ID: Christopher [name], male    DOB: [birth year], 67 y.o.   MRN: [id number]  HPI The patient is a 67 YO man coming in for follow up of starting metformin. He is taking 1 pill twice a day. For the first 1-2 days he had some loose stools. Denies any nausea or loose stools currently. Denies numbness or weakness. Denies high sugars or feeling thirsty or excessive urination. Is down another 7-8 pounds since last visit. He is working on diet and is going to the gym consistently daily.   Review of Systems  Constitutional: Negative.   HENT: Negative.   Eyes: Negative.   Respiratory: Negative for cough, chest tightness and shortness of breath.   Cardiovascular: Negative for chest pain, palpitations and leg swelling.  Gastrointestinal: Negative for abdominal distention, abdominal pain, constipation, diarrhea, nausea and vomiting.  Musculoskeletal: Negative.   Skin: Negative.   Neurological: Negative.   Psychiatric/Behavioral: Negative.       Objective:   Physical Exam  Constitutional: He is oriented to person, place, and time. He appears well-developed and well-nourished.  HENT:  Head: Normocephalic and atraumatic.  Eyes: EOM are normal.  Neck: Normal range of motion.  Cardiovascular: Normal rate and regular rhythm.  Pulmonary/Chest: Effort normal and breath sounds normal. No respiratory distress. He has no wheezes. He has no rales.  Abdominal: Soft. Bowel sounds are normal. He exhibits no distension. There is no tenderness. There is no rebound.  Musculoskeletal: He exhibits no edema.  Neurological: He is alert and oriented to person, place, and time. Coordination normal.  Skin: Skin is warm and dry.  Psychiatric: He has a normal mood and affect.   Vitals:   01/13/18 1006  BP: 126/78  Pulse: 65  Temp: 98.3 F (36.8 C)  TempSrc: Oral  SpO2: 98%  Weight: 229 lb (103.9 kg)  Height: 5\' 10"  (1.778 m)      Assessment & Plan:

## 2018-01-13 NOTE — Assessment & Plan Note (Signed)
Started on metformin 500 mg BID and has lost another 8 pounds since last visit. Down 30 pounds since beginning of the year. He is making smart choices and going to the gym daily. Checking HgA1c and fructosamine (as HgA1c may not be fully indicative of therapy success.

## 2018-01-14 LAB — FRUCTOSAMINE: Fructosamine: 246 umol/L (ref 190–270)

## 2018-01-21 ENCOUNTER — Other Ambulatory Visit: Payer: Self-pay | Admitting: Internal Medicine

## 2018-01-31 DIAGNOSIS — S00251A Superficial foreign body of right eyelid and periocular area, initial encounter: Secondary | ICD-10-CM | POA: Diagnosis not present

## 2018-02-03 NOTE — Progress Notes (Signed)
Electrophysiology Office Note Date: 02/04/2018  ID:  Christopher [name], DOB 10-26-1950, MRN [id number]  PCP: Hoyt Koch, MD Primary Cardiologist: End Electrophysiologist: Allred  CC: Routine ICD follow-up  Christopher [name] is a 67 y.o. male seen today for Dr Rayann Heman.  He presents today for routine electrophysiology followup.  Since last being seen in our clinic, the patient reports doing relatively well.  He is frustrated today because he isn't able to get his golf tournament on TV and he didn't sleep much last night.  He denies chest pain, palpitations, dyspnea, PND, orthopnea, nausea, vomiting, dizziness, syncope, edema, weight gain, or early satiety.  He has not had ICD shocks. He is riding a bike at the [facility] 30-35 miles per week without chest pain or shortness of breath. He has also recently been diagnosed with diabetes and was able to lower his A1C significantly with diet modifications.   Device History: MDT single chamber ICD implanted 2018 for cardiac arrest History of appropriate therapy: No History of AAD therapy: No   Past Medical History:  Diagnosis Date  . Arthritis   . Atrial fibrillation ([place])    post op, intol of anticoag  . Cardiac arrest [facility])    a. s/p MDT single chamber ICD  . Coronary artery disease    a. 2/7 Cath: LM nl, LAD min irregs, LCX min irregs, RI 40, RCA 27m, EF 55-60% basal to mid inf HK, 3-4+ MR;  b. 08/25/2012 PCI of RCA with 4.0x15 Vision BMS  . GERD (gastroesophageal reflux disease)    hx  . Hyperlipidemia    on statin  . Hypertension   . 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 Severe mitral regurgitation    a. Mitral valve prolapse with flail segment of posterior leaflet and severe MR by TEE, remote h/o bacterial endocarditis   . Sleep apnea    NPSG 01/21/06- AHI 40.7/hr cpap  .  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 AMPUTATION  07/28/2012   Procedure: AMPUTATION DIGIT;  Surgeon: Colin Rhein, MD;  Location: [facility] ORS;  Service: Orthopedics;  Laterality: Right;  2nd toe  . CARDIAC CATHETERIZATION    . EP IMPLANTABLE DEVICE N/A 05/01/2016   Procedure: Loop Recorder Removal;  Surgeon: Thompson Grayer, MD;  Location: [place] CV LAB;  Service: Cardiovascular;  Laterality: N/A;  . FOOT SURGERY     BILATERAL TOES  . ICD IMPLANT N/A 05/07/2017    Medtronic Visia AF MRI VR SureScan implanted by Dr Curt Bears following VF arrest  . Implantable loop recorder placement  03/31/13   MDT Linq implanted by Dr Rayann Heman to evaluate for further afib  . INTRAOPERATIVE TRANSESOPHAGEAL ECHOCARDIOGRAM N/A 09/27/2012   Procedure: INTRAOPERATIVE TRANSESOPHAGEAL ECHOCARDIOGRAM;  Surgeon: [name], MD;  Location: [place];  Service: Open Heart Surgery;  Laterality: N/A;  . LEFT AND RIGHT HEART CATHETERIZATION WITH CORONARY ANGIOGRAM N/A 08/12/2012   Procedure: LEFT AND RIGHT HEART CATHETERIZATION WITH CORONARY ANGIOGRAM;  Surgeon: [name], MD;  Location: [facility] CATH LAB;  Service: Cardiovascular;  Laterality: N/A;  . LEFT HEART CATH AND CORONARY ANGIOGRAPHY N/A 05/06/2017   Procedure: LEFT HEART CATH AND CORONARY ANGIOGRAPHY;  Surgeon: [name], MD;  Location: [place] CV LAB;  Service: Cardiovascular;  Laterality: N/A;  .  LOOP RECORDER IMPLANT N/A 03/31/2013   Procedure: LOOP RECORDER IMPLANT;  Surgeon: [name], MD;  Location: [place] CATH LAB;  Service: Cardiovascular;  Laterality: N/A;  . MITRAL VALVE REPAIR Right 09/27/2012   Procedure: MINIMALLY INVASIVE MITRAL VALVE REPAIR (MVR);  Surgeon: [name], MD;  Location: [place];  Service: Open Heart Surgery;  Laterality: Right;  Ultrasound guided  . PERCUTANEOUS CORONARY STENT INTERVENTION (PCI-S) N/A 08/25/2012   Procedure: PERCUTANEOUS CORONARY STENT  INTERVENTION (PCI-S);  Surgeon: Sherren Mocha, MD;  Location: [facility] CATH LAB;  Service: Cardiovascular;  Laterality: N/A;  . SEPTOPLASTY     Dr. [name]  . TEE WITHOUT CARDIOVERSION N/A 08/12/2012   Procedure: TRANSESOPHAGEAL ECHOCARDIOGRAM (TEE);  Surgeon: [name], MD;  Location: [place];  Service: Cardiovascular;  Laterality: N/A;  . TONSILLECTOMY    . UVULOPALATOPHARYNGOPLASTY      Current Outpatient Medications  Medication Sig Dispense Refill  . amLODipine (NORVASC) 5 MG tablet TAKE 1 TABLET BY MOUTH  DAILY 90 tablet 3  . aspirin EC 81 MG tablet Take 81 mg by mouth daily.    Marland Kitchen atorvastatin (LIPITOR) 20 MG tablet TAKE 1 TABLET BY MOUTH  DAILY 90 tablet 3  . cetirizine (ZYRTEC) 10 MG tablet Take 10 mg by mouth daily.    . Cholecalciferol (VITAMIN D) 2000 UNITS CAPS Take 2,000 Units by mouth daily.     . fluticasone (FLONASE) 50 MCG/ACT nasal spray USE 2 SPRAYS IN EACH  NOSTRIL DAILY 48 g 1  . metFORMIN (GLUCOPHAGE) 500 MG tablet Take 1 tablet (500 mg total) by mouth 2 (two) times daily with a meal. 180 tablet 3  . metoprolol succinate (TOPROL-XL) 100 MG 24 hr tablet TAKE 1 TABLET BY MOUTH  DAILY WITH OR IMMEDIATLEY  FOLLOWING A MEAL 90 tablet 3  . Omega-3 Fatty Acids (FISH OIL) 1000 MG CAPS Take 1,000 capsules by mouth 3 (three) times daily.    . ramipril (ALTACE) 10 MG capsule TAKE 1 CAPSULE BY MOUTH  DAILY 90 capsule 3  . tamsulosin (FLOMAX) 0.4 MG CAPS capsule TAKE 1 CAPSULE BY MOUTH  DAILY 90 capsule 1   No current facility-administered medications for this visit.     Allergies:   Codeine; Dust mite extract; Mold extract [trichophyton]; and Pollen extract   Social History: Social History   Socioeconomic History  . Marital status: Divorced    Spouse name: Not on file  . Number of children: Not on file  . Years of education: Not on file  . Highest education level: Not on file  Occupational History  . Occupation: Works at [employer]  . Financial  resource strain: Not on file  . Food insecurity:    Worry: Not on file    Inability: Not on file  . Transportation needs:    Medical: Not on file    Non-medical: Not on file  Tobacco Use  . Smoking status: Former Smoker    Packs/day: 2.50    Years: 5.00    Pack years: 12.50    Types: Cigarettes    Last attempt to quit: 07/06/1978    Years since quitting: 39.6  . Smokeless tobacco: Never Used  . Tobacco comment: social drinker  Substance and Sexual Activity  . Alcohol use: Yes    Comment: OCC.  . Drug use: No  . Sexual activity: Yes  Lifestyle  . Physical activity:    Days per week: Not on file    Minutes per session: Not  on file  . Stress: Not on file  Relationships  . Social connections:    Talks on phone: Not on file    Gets together: Not on file    Attends religious service: Not on file    Active member of club or organization: Not on file    Attends meetings of clubs or organizations: Not on file    Relationship status: Not on file  . Intimate partner violence:    Fear of current or ex partner: Not on file    Emotionally abused: Not on file    Physically abused: Not on file    Forced sexual activity: Not on file  Other Topics Concern  . Not on file  Social History Narrative   Works at [employer] - plans to retire 07/2015 after 36 years.   Divorced, lives alone   Supportive g-friend (shirlee moore)          Family History: Family History  Problem Relation Age of Onset  . Heart disease Father   . Colon cancer Father 74  . Diabetes Father   . Renal cancer Father   . Diabetes Mother   . Stroke Mother   . Sudden death Brother   . Heart attack Brother     Review of Systems: All other systems reviewed and are otherwise negative except as noted above.   Physical Exam: VS:  BP (!) 170/80 (BP Location: Left Arm, Patient Position: Sitting, Cuff Size: Normal)   Pulse 97   Ht 5\' 10"  (1.778 m)   Wt 230 lb 3.2 oz (104.4 kg)   SpO2 (!) 63%   BMI 33.03 kg/m  ,  BMI Body mass index is 33.03 kg/m.  GEN- The patient is well appearing, alert and oriented x 3 today.   HEENT: normocephalic, atraumatic; sclera clear, conjunctiva pink; hearing intact; oropharynx clear; neck supple  Lungs- Clear to ausculation bilaterally, normal work of breathing.  No wheezes, rales, rhonchi Heart- Regular rate and rhythm  GI- soft, non-tender, non-distended, bowel sounds present  Extremities- no clubbing, cyanosis, or edema  MS- no significant deformity or atrophy Skin- warm and dry, no rash or lesion; ICD pocket well healed Psych- euthymic mood, full affect Neuro- strength and sensation are intact  ICD interrogation- reviewed in detail today,  See PACEART report  EKG:  EKG is not ordered today.  Recent Labs: 05/05/2017: B Natriuretic Peptide 139.2; Magnesium 2.2; TSH 1.966 05/06/2017: ALT 56 05/09/2017: BUN 11; Creatinine, Ser 0.91; Hemoglobin 14.4; Platelets 139; Potassium 3.4; Sodium 136   Wt Readings from Last 3 Encounters:  02/04/18 230 lb 3.2 oz (104.4 kg)  01/13/18 229 lb (103.9 kg)  11/11/17 236 lb (107 kg)     Other studies Reviewed: Additional studies/ records that were reviewed today include: Dr Rayann Heman and Dr Darnelle Bos office notes   Assessment and Plan:  1.  VF arrest No recurrence of ventricular arrhythmias Normal ICD function See Pace Art report No changes today  2.  CAD/ICM No recent ischemic symptoms Continue medical therapy Continue ICM clinic  3.  HTN BP elevated today but has been stable at home and at recent appointments. He his frustrated about his TV provider service. I have advised to monitor and if remains elevated, may need to adjust medications  Current medicines are reviewed at length with the patient today.   The patient does not have concerns regarding his medicines.  The following changes were made today:  none  Labs/ tests ordered today include: none Orders  Placed This Encounter  Procedures  . CUP PACEART INCLINIC  DEVICE CHECK     Disposition:   Follow up with [facility], me in 1 year    Signed, Chanetta Marshall, NP 02/04/2018 9:09 AM  [address] [place] [address] [phone] (office) [phone] (fax)

## 2018-02-04 ENCOUNTER — Ambulatory Visit: Payer: Medicare Other | Admitting: Nurse Practitioner

## 2018-02-04 VITALS — BP 170/80 | HR 97 | Ht 70.0 in | Wt 230.2 lb

## 2018-02-04 DIAGNOSIS — I10 Essential (primary) hypertension: Secondary | ICD-10-CM | POA: Diagnosis not present

## 2018-02-04 DIAGNOSIS — I469 Cardiac arrest, cause unspecified: Secondary | ICD-10-CM

## 2018-02-04 DIAGNOSIS — I5022 Chronic systolic (congestive) heart failure: Secondary | ICD-10-CM

## 2018-02-04 DIAGNOSIS — I4901 Ventricular fibrillation: Secondary | ICD-10-CM

## 2018-02-04 DIAGNOSIS — I2510 Atherosclerotic heart disease of native coronary artery without angina pectoris: Secondary | ICD-10-CM

## 2018-02-04 LAB — CUP PACEART INCLINIC DEVICE CHECK
Date Time Interrogation Session: 20190802085548
Implantable Lead Implant Date: 20181102
Implantable Lead Location: 753860
Implantable Pulse Generator Implant Date: 20181102

## 2018-02-04 NOTE — Patient Instructions (Addendum)
Medication Instructions:   Your physician recommends that you continue on your current medications as directed. Please refer to the Current Medication list given to you today.   If you need a refill on your cardiac medications before your next appointment, please call your pharmacy.  Labwork: NONE ORDERED  TODAY    Testing/Procedures: NONE ORDERED  TODAY    Follow-Up:Your physician wants you to follow-up in: [place] will receive a reminder letter in the mail two months in advance. If you don't receive a letter, please call our office to schedule the follow-up appointment.    Remote monitoring is used to monitor your Pacemaker of ICD from home. This monitoring reduces the number of office visits required to check your device to one time per year. It allows [place] to keep an eye on the functioning of your device to ensure it is working properly. You are scheduled for a device check from home on .  02-07-18   You may send your transmission at any time that day. If you have a wireless device, the transmission will be sent automatically. After your physician reviews your transmission, you will receive a postcard with your next transmission date.     Any Other Special Instructions Will Be Listed Below (If Applicable).

## 2018-02-07 ENCOUNTER — Encounter: Payer: Self-pay | Admitting: Internal Medicine

## 2018-02-07 ENCOUNTER — Ambulatory Visit (INDEPENDENT_AMBULATORY_CARE_PROVIDER_SITE_OTHER): Payer: Medicare Other | Admitting: *Deleted

## 2018-02-07 DIAGNOSIS — I428 Other cardiomyopathies: Secondary | ICD-10-CM

## 2018-02-07 LAB — FECAL OCCULT BLOOD, GUAIAC: Fecal Occult Blood: NEGATIVE

## 2018-02-08 NOTE — Progress Notes (Signed)
Remote ICD transmission.   

## 2018-02-21 ENCOUNTER — Encounter: Payer: Self-pay | Admitting: Internal Medicine

## 2018-02-21 MED ORDER — TADALAFIL 20 MG PO TABS: 10.0000 mg | ORAL_TABLET | ORAL | 11 refills | Status: DC | PRN

## 2018-02-22 LAB — CUP PACEART REMOTE DEVICE CHECK
Battery Remaining Longevity: 128 mo
Battery Voltage: 3.01 V
Brady Statistic RV Percent Paced: 0.34 %
Date Time Interrogation Session: 20190805073325
HighPow Impedance: 74 Ohm
Implantable Lead Implant Date: 20181102
Implantable Lead Location: 753860
Implantable Pulse Generator Implant Date: 20181102
Lead Channel Impedance Value: 513 Ohm
Lead Channel Impedance Value: 627 Ohm
Lead Channel Pacing Threshold Amplitude: 0.5 V
Lead Channel Pacing Threshold Pulse Width: 0.4 ms
Lead Channel Sensing Intrinsic Amplitude: 6.375 mV
Lead Channel Sensing Intrinsic Amplitude: 6.375 mV
Lead Channel Setting Pacing Amplitude: 2 V
Lead Channel Setting Pacing Pulse Width: 0.4 ms
Lead Channel Setting Sensing Sensitivity: 0.3 mV

## 2018-02-23 ENCOUNTER — Encounter: Payer: Self-pay | Admitting: Internal Medicine

## 2018-02-23 NOTE — Progress Notes (Signed)
Abstracted and sent to scan  

## 2018-03-17 ENCOUNTER — Telehealth: Payer: Self-pay

## 2018-03-17 ENCOUNTER — Ambulatory Visit (INDEPENDENT_AMBULATORY_CARE_PROVIDER_SITE_OTHER): Payer: Medicare Other

## 2018-03-17 DIAGNOSIS — I5022 Chronic systolic (congestive) heart failure: Secondary | ICD-10-CM

## 2018-03-17 DIAGNOSIS — Z95810 Presence of automatic (implantable) cardiac defibrillator: Secondary | ICD-10-CM | POA: Diagnosis not present

## 2018-03-17 NOTE — Telephone Encounter (Signed)
Spoke with pt and reminded pt of remote transmission that is due today. Pt verbalized understanding.  Monthly check with Laurie. 

## 2018-03-18 ENCOUNTER — Encounter: Payer: Self-pay | Admitting: Internal Medicine

## 2018-03-18 ENCOUNTER — Ambulatory Visit: Payer: Medicare Other | Admitting: Internal Medicine

## 2018-03-18 VITALS — BP 132/80 | HR 66 | Ht 70.0 in | Wt 224.0 lb

## 2018-03-18 DIAGNOSIS — I38 Endocarditis, valve unspecified: Secondary | ICD-10-CM

## 2018-03-18 DIAGNOSIS — I10 Essential (primary) hypertension: Secondary | ICD-10-CM

## 2018-03-18 DIAGNOSIS — I480 Paroxysmal atrial fibrillation: Secondary | ICD-10-CM

## 2018-03-18 DIAGNOSIS — I2510 Atherosclerotic heart disease of native coronary artery without angina pectoris: Secondary | ICD-10-CM | POA: Diagnosis not present

## 2018-03-18 DIAGNOSIS — E785 Hyperlipidemia, unspecified: Secondary | ICD-10-CM

## 2018-03-18 DIAGNOSIS — I428 Other cardiomyopathies: Secondary | ICD-10-CM

## 2018-03-18 NOTE — Progress Notes (Signed)
EPIC Encounter for ICM Monitoring  Patient Name: Christopher [name] is a 67 y.o. male Date: 03/18/2018 Primary Care Physican: Hoyt Koch, MD Primary Cardiologist:End Electrophysiologist:Allred Dry Weight:227lbs      Heart Failure questions reviewed, pt asymptomatic.   Thoracic impedance normal.  No diuretic  Recommendations: No changes.  Encouraged to call for fluid symptoms.  Follow-up plan: ICM clinic phone appointment on 04/18/2018.   Copy of ICM check sent to Dr. Rayann Heman.   3 month ICM trend: 03/17/2018    1 Year ICM trend:       Rosalene Billings, RN 03/18/2018 9:06 AM

## 2018-03-18 NOTE — Patient Instructions (Addendum)
Medication Instructions:  Your physician recommends that you continue on your current medications as directed. Please refer to the Current Medication list given to you today.  -- If you need a refill on your cardiac medications before your next appointment, please call your pharmacy. --  Labwork: None ordered  Testing/Procedures: None ordered  Follow-Up: Your physician wants you to follow-up in: 6 months with Richardson Dopp   You will receive a reminder letter in the mail two months in advance. If you don't receive a letter, please call our office to schedule the follow-up appointment.  Thank you for choosing [facility]!!    Any Other Special Instructions Will Be Listed Below (If Applicable).

## 2018-03-18 NOTE — Progress Notes (Signed)
Follow-up Outpatient Visit Date: 03/18/2018  Primary Care Provider: Hoyt Koch, MD [place] [address]  Chief Complaint: Follow-up CAD, valvular heart disease, and cardiac arrest  HPI:  Christopher [name] is a 67 y.o. 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  I last saw him in March, at which time he was feeling well.  He had not had any further syncopal episodes following his cardiac arrest in 04/2017.  He was also doing well at his last EP visit in August.  Today, Christopher [name] is doing well other than intermittent sinus congestion in the mornings with associated dizziness.  He denies palpitations and lightheadedness.  He notes a single episode of central chest soreness that occurred after lifting weights at the gym.  The patient was not exertional and has since resolved.  He continues to work out on a regular basis.  He denies shortness of breath and edema.  --------------------------------------------------------------------------------------------------  Past Medical History:  Diagnosis Date  . Arthritis   . Atrial fibrillation ([place])    post op, intol of anticoag  . Cardiac arrest [facility])    a. s/p MDT single chamber ICD  . Coronary artery disease    a. 2/7 Cath: LM nl, LAD min irregs, LCX min irregs, RI 40, RCA 38m, EF 55-60% basal to mid inf HK, 3-4+ MR;  b. 08/25/2012 PCI of RCA with 4.0x15 Vision BMS  . GERD (gastroesophageal reflux disease)    hx  . Hyperlipidemia    on statin  . Hypertension   . S/P mitral valve repair 09/27/2012   Complex valvuloplasty including triangular resection of flail posterior leaflet  with 30 mm Sorin Memo 3D ring annuloplasty via right mini thoracotomy approach  . Severe mitral regurgitation    a. Mitral valve prolapse with flail segment of posterior leaflet and severe MR by TEE, remote h/o bacterial endocarditis   . Sleep apnea    NPSG 01/21/06- AHI 40.7/hr cpap  . 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 AMPUTATION  07/28/2012   Procedure: AMPUTATION DIGIT;  Surgeon: Colin Rhein, MD;  Location: [facility];  Service: Orthopedics;  Laterality: Right;  2nd toe  . CARDIAC CATHETERIZATION    . EP IMPLANTABLE DEVICE N/A 05/01/2016   Procedure: Loop Recorder Removal;  Surgeon: Thompson Grayer, MD;  Location: [place] CV LAB;  Service: Cardiovascular;  Laterality: N/A;  . FOOT SURGERY     BILATERAL TOES  . ICD IMPLANT N/A 05/07/2017    Medtronic Visia AF MRI VR SureScan implanted by Dr Curt Bears following VF arrest  . Implantable loop recorder placement  03/31/13   MDT Linq implanted by Dr Rayann Heman to evaluate for further afib  . INTRAOPERATIVE TRANSESOPHAGEAL ECHOCARDIOGRAM N/A 09/27/2012   Procedure: INTRAOPERATIVE TRANSESOPHAGEAL ECHOCARDIOGRAM;  Surgeon: [name], MD;  Location: [place];  Service: Open Heart Surgery;  Laterality: N/A;  . LEFT AND RIGHT HEART CATHETERIZATION WITH CORONARY ANGIOGRAM N/A 08/12/2012   Procedure: LEFT AND RIGHT HEART CATHETERIZATION WITH CORONARY ANGIOGRAM;  Surgeon: [name], MD;  Location: [facility] CATH LAB;  Service: Cardiovascular;  Laterality: N/A;  . LEFT HEART CATH AND CORONARY ANGIOGRAPHY N/A 05/06/2017  Procedure: LEFT HEART CATH AND CORONARY ANGIOGRAPHY;  Surgeon: [name], MD;  Location: [place] CV LAB;  Service: Cardiovascular;  Laterality: N/A;  . LOOP RECORDER IMPLANT N/A 03/31/2013   Procedure: LOOP RECORDER IMPLANT;  Surgeon: [name], MD;  Location: [place] CATH LAB;  Service: Cardiovascular;  Laterality: N/A;  . MITRAL VALVE REPAIR Right  09/27/2012   Procedure: MINIMALLY INVASIVE MITRAL VALVE REPAIR (MVR);  Surgeon: [name], MD;  Location: [place];  Service: Open Heart Surgery;  Laterality: Right;  Ultrasound guided  . PERCUTANEOUS CORONARY STENT INTERVENTION (PCI-S) N/A 08/25/2012   Procedure: PERCUTANEOUS CORONARY STENT INTERVENTION (PCI-S);  Surgeon: Sherren Mocha, MD;  Location: [facility] CATH LAB;  Service: Cardiovascular;  Laterality: N/A;  . SEPTOPLASTY     Dr. [name]  . TEE WITHOUT CARDIOVERSION N/A 08/12/2012   Procedure: TRANSESOPHAGEAL ECHOCARDIOGRAM (TEE);  Surgeon: [name], MD;  Location: [place];  Service: Cardiovascular;  Laterality: N/A;  . TONSILLECTOMY    . UVULOPALATOPHARYNGOPLASTY      Current Meds  Medication Sig  . amLODipine (NORVASC) 5 MG tablet TAKE 1 TABLET BY MOUTH  DAILY  . aspirin EC 81 MG tablet Take 81 mg by mouth daily.  Marland Kitchen atorvastatin (LIPITOR) 20 MG tablet TAKE 1 TABLET BY MOUTH  DAILY  . cetirizine (ZYRTEC) 10 MG tablet Take 10 mg by mouth daily.  . Cholecalciferol (VITAMIN D) 2000 UNITS CAPS Take 2,000 Units by mouth daily.   . fluticasone (FLONASE) 50 MCG/ACT nasal spray USE 2 SPRAYS IN EACH  NOSTRIL DAILY  . metFORMIN (GLUCOPHAGE) 500 MG tablet Take 1 tablet (500 mg total) by mouth 2 (two) times daily with a meal.  . metoprolol succinate (TOPROL-XL) 100 MG 24 hr tablet TAKE 1 TABLET BY MOUTH  DAILY WITH OR IMMEDIATLEY  FOLLOWING A MEAL  . Omega-3 Fatty Acids (FISH OIL) 1000 MG CAPS Take 1,000 capsules by mouth 3 (three) times daily.  . ramipril (ALTACE) 10 MG capsule TAKE 1 CAPSULE BY MOUTH  DAILY  . tadalafil (ADCIRCA/CIALIS) 20 MG tablet Take 0.5-1 tablets (10-20 mg total) by mouth every other day as needed for erectile dysfunction.  . tamsulosin (FLOMAX) 0.4 MG CAPS capsule TAKE 1 CAPSULE BY MOUTH  DAILY    Allergies: Codeine; Dust mite extract; Mold extract [trichophyton]; and Pollen extract  Social History   Tobacco Use  . Smoking status: Former Smoker     Packs/day: 2.50    Years: 5.00    Pack years: 12.50    Types: Cigarettes    Last attempt to quit: 07/06/1978    Years since quitting: 39.7  . Smokeless tobacco: Never Used  . Tobacco comment: social drinker  Substance Use Topics  . Alcohol use: Yes    Comment: OCC.  . Drug use: No    Family History  Problem Relation Age of Onset  . Heart disease Father   . Colon cancer Father 34  . Diabetes Father   . Renal cancer Father   . Diabetes Mother   . Stroke Mother   . Sudden death Brother   . Heart attack Brother     Review of Systems: A 12-system review of systems was performed and was negative except as noted in the HPI.  --------------------------------------------------------------------------------------------------  Physical Exam: BP 132/80   Pulse 66   Ht 5\' 10"  (1.778 m)   Wt 224 lb (101.6 kg)   SpO2 93%   BMI 32.14 kg/m   General:  NAD. HEENT: No conjunctival pallor  or scleral icterus. Moist mucous membranes.  OP clear. Neck: Supple without lymphadenopathy, thyromegaly, JVD, or HJR.  Lungs: Normal work of breathing. Clear to auscultation bilaterally without wheezes or crackles. Heart: Regular rate and rhythm without murmurs, rubs, or gallops. Non-displaced PMI. Abd: Bowel sounds present. Soft, NT/ND without hepatosplenomegaly Ext: No lower extremity edema. Radial, PT, and DP pulses are 2+ bilaterally. Skin: Warm and dry without rash.  EKG:  NSR with RBBB, right axis deviation, and inferior Q-waves.  Lab Results  Component Value Date   WBC 7.1 05/09/2017   HGB 14.4 05/09/2017   HCT 42.2 05/09/2017   MCV 88.8 05/09/2017   PLT 139 (L) 05/09/2017    Lab Results  Component Value Date   NA 136 05/09/2017   K 3.4 (L) 05/09/2017   CL 101 05/09/2017   CO2 26 05/09/2017   BUN 11 05/09/2017   CREATININE 0.91 05/09/2017   GLUCOSE 161 (H) 05/09/2017   ALT 56 05/06/2017    Lab Results  Component Value Date   CHOL 115 01/10/2018   HDL 32 (L) 01/10/2018    LDLCALC 52 01/10/2018   LDLDIRECT 42 10/04/2017   TRIG 154 (H) 01/10/2018   CHOLHDL 3.6 01/10/2018    --------------------------------------------------------------------------------------------------  ASSESSMENT AND PLAN: CAD No angina or shortness of breath.  Continue current medications for secondary prevention, as well as regular exercise.  Valvular heart disease (endocarditis status post mitral valve repair) No signs or symptoms of heart failure.  No murmurs on exam today.  Continue low-dose aspirin and SBE prophylaxis for dental procedures.  Non-ischemic cardiomyopathy and history of cardiac arrest Euvolemic and well-compensated (NYHA class I-II).  LVEF low-normal (50-55%) in April. Continue metoprolol and ramipril.  Continue EP/device clinic follow-up s/p ICD.  Hypertension Borderline elevated blood pressure.  Continue current medications and focus on diet and exercise.  If BP remains elevated, escalation of ramipril will need to be considered.  Post-operative atrial fibrillation No recurrence based on symptoms. EKG today shows NSR.  No further intervention at this time.  Hyperlipidemia LDL at goal.  Continue atorvastatin 20 mg daily.  Follow-up: Return to clinic in 6 months with Richardson Dopp, PA.  Patient wishes to think about whether he will follow-up with me long-term in [place] or establish with a new provider in [place].  Nelva Bush, MD 03/18/2018 9:21 AM

## 2018-04-12 DIAGNOSIS — H524 Presbyopia: Secondary | ICD-10-CM | POA: Diagnosis not present

## 2018-04-12 DIAGNOSIS — E119 Type 2 diabetes mellitus without complications: Secondary | ICD-10-CM | POA: Diagnosis not present

## 2018-04-12 LAB — HM DIABETES EYE EXAM

## 2018-04-14 ENCOUNTER — Encounter: Payer: Self-pay | Admitting: Internal Medicine

## 2018-04-14 NOTE — Progress Notes (Signed)
Abstracted and sent to scan  

## 2018-04-18 ENCOUNTER — Ambulatory Visit (INDEPENDENT_AMBULATORY_CARE_PROVIDER_SITE_OTHER): Payer: Medicare Other

## 2018-04-18 DIAGNOSIS — Z95810 Presence of automatic (implantable) cardiac defibrillator: Secondary | ICD-10-CM

## 2018-04-18 DIAGNOSIS — I5022 Chronic systolic (congestive) heart failure: Secondary | ICD-10-CM

## 2018-04-19 NOTE — Progress Notes (Signed)
EPIC Encounter for ICM Monitoring  Patient Name: Christopher [name] is a 67 y.o. male Date: 04/19/2018 Primary Care Physican: Hoyt Koch, MD Primary Cardiologist:End Electrophysiologist:Allred Dry Weight:224lbs          Heart Failure questions reviewed, pt asymptomatic.   Thoracic impedance normal.   No diuretic  Recommendations: No changes.  Encouraged to call for fluid symptoms.  Follow-up plan: ICM clinic phone appointment on 05/19/2018.      Copy of ICM check sent to Dr. Rayann Heman.   3 month ICM trend: 04/18/2018    1 Year ICM trend:       Christopher Billings, RN 04/19/2018 11:08 AM

## 2018-04-22 ENCOUNTER — Ambulatory Visit (INDEPENDENT_AMBULATORY_CARE_PROVIDER_SITE_OTHER): Payer: Medicare Other

## 2018-04-22 DIAGNOSIS — Z23 Encounter for immunization: Secondary | ICD-10-CM | POA: Diagnosis not present

## 2018-05-16 ENCOUNTER — Other Ambulatory Visit: Payer: Self-pay | Admitting: Internal Medicine

## 2018-05-16 ENCOUNTER — Other Ambulatory Visit: Payer: Self-pay | Admitting: Cardiology

## 2018-05-16 DIAGNOSIS — I480 Paroxysmal atrial fibrillation: Secondary | ICD-10-CM

## 2018-05-19 ENCOUNTER — Encounter: Payer: Self-pay | Admitting: Internal Medicine

## 2018-05-19 ENCOUNTER — Ambulatory Visit (INDEPENDENT_AMBULATORY_CARE_PROVIDER_SITE_OTHER): Payer: Medicare Other | Admitting: *Deleted

## 2018-05-19 ENCOUNTER — Ambulatory Visit (INDEPENDENT_AMBULATORY_CARE_PROVIDER_SITE_OTHER): Payer: Medicare Other

## 2018-05-19 ENCOUNTER — Other Ambulatory Visit (INDEPENDENT_AMBULATORY_CARE_PROVIDER_SITE_OTHER): Payer: Medicare Other

## 2018-05-19 ENCOUNTER — Ambulatory Visit (INDEPENDENT_AMBULATORY_CARE_PROVIDER_SITE_OTHER): Payer: Medicare Other | Admitting: Internal Medicine

## 2018-05-19 VITALS — BP 130/78 | HR 69 | Temp 98.4°F | Ht 70.0 in | Wt 237.0 lb

## 2018-05-19 DIAGNOSIS — I10 Essential (primary) hypertension: Secondary | ICD-10-CM

## 2018-05-19 DIAGNOSIS — I480 Paroxysmal atrial fibrillation: Secondary | ICD-10-CM | POA: Diagnosis not present

## 2018-05-19 DIAGNOSIS — E1169 Type 2 diabetes mellitus with other specified complication: Secondary | ICD-10-CM | POA: Diagnosis not present

## 2018-05-19 DIAGNOSIS — Z0000 Encounter for general adult medical examination without abnormal findings: Secondary | ICD-10-CM

## 2018-05-19 DIAGNOSIS — E118 Type 2 diabetes mellitus with unspecified complications: Secondary | ICD-10-CM | POA: Diagnosis not present

## 2018-05-19 DIAGNOSIS — Z95810 Presence of automatic (implantable) cardiac defibrillator: Secondary | ICD-10-CM | POA: Diagnosis not present

## 2018-05-19 DIAGNOSIS — I5022 Chronic systolic (congestive) heart failure: Secondary | ICD-10-CM

## 2018-05-19 DIAGNOSIS — E785 Hyperlipidemia, unspecified: Secondary | ICD-10-CM

## 2018-05-19 LAB — COMPREHENSIVE METABOLIC PANEL
ALT: 18 U/L (ref 0–53)
AST: 18 U/L (ref 0–37)
Albumin: 4.4 g/dL (ref 3.5–5.2)
Alkaline Phosphatase: 60 U/L (ref 39–117)
BUN: 18 mg/dL (ref 6–23)
CO2: 31 mEq/L (ref 19–32)
Calcium: 9.2 mg/dL (ref 8.4–10.5)
Chloride: 100 mEq/L (ref 96–112)
Creatinine, Ser: 0.97 mg/dL (ref 0.40–1.50)
GFR: 81.62 mL/min (ref >60.00)
Glucose, Bld: 118 mg/dL — ABNORMAL HIGH (ref 70–99)
Potassium: 3.3 mEq/L — ABNORMAL LOW (ref 3.5–5.1)
Sodium: 138 mEq/L (ref 135–145)
Total Bilirubin: 0.7 mg/dL (ref 0.2–1.2)
Total Protein: 7.1 g/dL (ref 6.0–8.3)

## 2018-05-19 LAB — CBC
HCT: 44.6 % (ref 39.0–52.0)
Hemoglobin: 15 g/dL (ref 13.0–17.0)
MCHC: 33.7 g/dL (ref 30.0–36.0)
MCV: 86.5 fl (ref 78.0–100.0)
Platelets: 156 10*3/uL (ref 150.0–400.0)
RBC: 5.15 Mil/uL (ref 4.22–5.81)
RDW: 14.6 % (ref 11.5–15.5)
WBC: 6.4 10*3/uL (ref 4.0–10.5)

## 2018-05-19 LAB — LIPID PANEL
Cholesterol: 130 mg/dL (ref 0–200)
HDL: 34.2 mg/dL — ABNORMAL LOW (ref >39.00)
NonHDL: 95.43
Total CHOL/HDL Ratio: 4
Triglycerides: 336 mg/dL — ABNORMAL HIGH (ref 0.0–149.0)
VLDL: 67.2 mg/dL — ABNORMAL HIGH (ref 0.0–40.0)

## 2018-05-19 LAB — HEMOGLOBIN A1C: Hgb A1c MFr Bld: 6 % (ref 4.6–6.5)

## 2018-05-19 LAB — LDL CHOLESTEROL, DIRECT: Direct LDL: 64 mg/dL

## 2018-05-19 MED ORDER — TAMSULOSIN HCL 0.4 MG PO CAPS: 0.4000 mg | ORAL_CAPSULE | Freq: Every day | ORAL | 3 refills | Status: DC

## 2018-05-19 MED ORDER — METOPROLOL SUCCINATE ER 100 MG PO TB24: ORAL_TABLET | ORAL | 3 refills | Status: DC

## 2018-05-19 MED ORDER — RAMIPRIL 10 MG PO CAPS: 10.0000 mg | ORAL_CAPSULE | Freq: Every day | ORAL | 3 refills | Status: DC

## 2018-05-19 MED ORDER — ATORVASTATIN CALCIUM 20 MG PO TABS: 20.0000 mg | ORAL_TABLET | Freq: Every day | ORAL | 3 refills | Status: DC

## 2018-05-19 MED ORDER — METFORMIN HCL 500 MG PO TABS: 500.0000 mg | ORAL_TABLET | Freq: Two times a day (BID) | ORAL | 3 refills | Status: DC

## 2018-05-19 NOTE — Progress Notes (Signed)
   Subjective:    Patient ID: Christopher [name], male    DOB: May 26, 1951, 67 y.o.   MRN: [id number]  HPI Here for medicare wellness and physical, no new complaints. Please see A/P for status and treatment of chronic medical problems.   Diet: DM since diabetic Physical activity: exercises 2-3 times per week Depression/mood screen: negative Hearing: intact to whispered voice Visual acuity: grossly normal, performs annual eye exam  ADLs: capable Fall risk: none Home safety: good Cognitive evaluation: intact to orientation, naming, recall and repetition EOL planning: adv directives discussed  I have personally reviewed and have noted 1. The patient's medical and social history - reviewed today no changes 2. Their use of alcohol, tobacco or illicit drugs 3. Their current medications and supplements 4. The patient's functional ability including ADL's, fall risks, home safety risks and hearing or visual impairment. 5. Diet and physical activities 6. Evidence for depression or mood disorders 7. Care team reviewed and updated (available in snapshot)  Review of Systems  Constitutional: Negative.   HENT: Negative.   Eyes: Negative.   Respiratory: Negative for cough, chest tightness and shortness of breath.   Cardiovascular: Negative for chest pain, palpitations and leg swelling.  Gastrointestinal: Negative for abdominal distention, abdominal pain, constipation, diarrhea, nausea and vomiting.  Musculoskeletal: Negative.   Skin: Negative.   Neurological: Negative.   Psychiatric/Behavioral: Negative.       Objective:   Physical Exam  Constitutional: He is oriented to person, place, and time. He appears well-developed and well-nourished.  HENT:  Head: Normocephalic and atraumatic.  Eyes: EOM are normal.  Neck: Normal range of motion.  Cardiovascular: Normal rate and regular rhythm.  Pulmonary/Chest: Effort normal and breath sounds normal. No respiratory distress. He has no wheezes. He  has no rales.  Abdominal: Soft. Bowel sounds are normal. He exhibits no distension. There is no tenderness. There is no rebound.  Musculoskeletal: He exhibits no edema.  Neurological: He is alert and oriented to person, place, and time. Coordination normal.  Skin: Skin is warm and dry.  Foot exam done  Psychiatric: He has a normal mood and affect.   Vitals:   05/19/18 1304  BP: 130/78  Pulse: 69  Temp: 98.4 F (36.9 C)  TempSrc: Oral  SpO2: 97%  Weight: 237 lb (107.5 kg)  Height: 5\' 10"  (1.778 m)      Assessment & Plan:

## 2018-05-19 NOTE — Assessment & Plan Note (Signed)
Checking lipid panel and adjust lipitor 20 mg daily as needed. 

## 2018-05-19 NOTE — Assessment & Plan Note (Signed)
Taking amlodipine and metoprolol and ramipril and adjust as needed. Checking CMP.

## 2018-05-19 NOTE — Assessment & Plan Note (Signed)
Complicated by CAD and taking ACE-I and statin. Taking metformin 500 mg BID and checking HgA1c and adjust as needed. Weight loss has stabilized. Foot exam done and eye exam reminder given.

## 2018-05-19 NOTE — Progress Notes (Signed)
Remote ICD transmission.   

## 2018-05-19 NOTE — Patient Instructions (Signed)

## 2018-05-19 NOTE — Assessment & Plan Note (Signed)
Flu shot up to date. Pneumonia up to date. Shingrix counseled. Tetanus up to date. Colonoscopy counseled. Counseled about sun safety and mole surveillance. Counseled about the dangers of distracted driving. Given 10 year screening recommendations.

## 2018-05-20 NOTE — Progress Notes (Addendum)
EPIC Encounter for ICM Monitoring  Patient Name: Christopher [name] is a 67 y.o. male Date: 05/20/2018 Primary Care Physican: Hoyt Koch, MD Primary Cardiologist:End Electrophysiologist:Allred Last Weight:230lbs                                         Heart Failure questions reviewed, pt asymptomatic.  Patient has made diet changes resulting in A1C dropped from 13% to 6%.     Thoracic impedance normal.   No diuretic  Recommendations: No changes.  Encouraged to call for fluid symptoms.  Follow-up plan: ICM clinic phone appointment on 06/21/2018.      Copy of ICM check sent to Dr. Rayann Heman.   3 month ICM trend: 05/19/2018    1 Year ICM trend:       Rosalene Billings, RN 05/20/2018 3:49 PM

## 2018-05-23 ENCOUNTER — Other Ambulatory Visit: Payer: Self-pay | Admitting: Internal Medicine

## 2018-05-23 ENCOUNTER — Telehealth: Payer: Self-pay | Admitting: Internal Medicine

## 2018-05-23 MED ORDER — ATORVASTATIN CALCIUM 40 MG PO TABS: 40.0000 mg | ORAL_TABLET | Freq: Every day | ORAL | 3 refills | Status: DC

## 2018-05-23 NOTE — Telephone Encounter (Signed)
[facility]

## 2018-05-23 NOTE — Telephone Encounter (Signed)
Copied from [place] [phone]. Topic: Quick Communication - See Telephone Encounter >> May 23, 2018 12:56 PM Conception Chancy, NT wrote: CRM for notification. See Telephone encounter for: 05/23/18.  Patient is calling and states atorvastatin (LIPITOR) 40 MG tablet  was supposed to be sent to his mail order pharmacy for a 90 day supply. Please advise.  [place] [place] [facility] [address] [place] [address] Phone: [phone] Fax: [phone]

## 2018-06-21 ENCOUNTER — Ambulatory Visit (INDEPENDENT_AMBULATORY_CARE_PROVIDER_SITE_OTHER): Payer: Medicare Other

## 2018-06-21 DIAGNOSIS — Z95810 Presence of automatic (implantable) cardiac defibrillator: Secondary | ICD-10-CM

## 2018-06-21 DIAGNOSIS — I5022 Chronic systolic (congestive) heart failure: Secondary | ICD-10-CM | POA: Diagnosis not present

## 2018-06-23 NOTE — Progress Notes (Signed)
EPIC Encounter for ICM Monitoring  Patient Name: Christopher [name] is a 67 y.o. male Date: 06/23/2018 Primary Care Physican: Hoyt Koch, MD Primary Cardiologist:End Electrophysiologist:Allred Last Weight:230lbs  Transmission reviewed   Thoracic impedance normal.  No diuretic  Recommendations:None  Follow-up plan: ICM clinic phone appointment on1/23/2020  Copy of ICM check sent to Dr.Allred.   3 month ICM trend: 06/21/2018    1 Year ICM trend:       Rosalene Billings, RN 06/23/2018 4:24 PM

## 2018-07-18 LAB — CUP PACEART REMOTE DEVICE CHECK
Battery Remaining Longevity: 125 mo
Battery Voltage: 3.03 V
Brady Statistic RV Percent Paced: 0.47 %
Date Time Interrogation Session: 20191114093824
HighPow Impedance: 76 Ohm
Implantable Lead Implant Date: 20181102
Implantable Lead Location: 753860
Implantable Pulse Generator Implant Date: 20181102
Lead Channel Impedance Value: 513 Ohm
Lead Channel Impedance Value: 627 Ohm
Lead Channel Pacing Threshold Amplitude: 0.5 V
Lead Channel Pacing Threshold Pulse Width: 0.4 ms
Lead Channel Sensing Intrinsic Amplitude: 6 mV
Lead Channel Sensing Intrinsic Amplitude: 6 mV
Lead Channel Setting Pacing Amplitude: 2 V
Lead Channel Setting Pacing Pulse Width: 0.4 ms
Lead Channel Setting Sensing Sensitivity: 0.3 mV

## 2018-07-28 ENCOUNTER — Ambulatory Visit (INDEPENDENT_AMBULATORY_CARE_PROVIDER_SITE_OTHER): Payer: Medicare Other

## 2018-07-28 DIAGNOSIS — I5022 Chronic systolic (congestive) heart failure: Secondary | ICD-10-CM | POA: Diagnosis not present

## 2018-07-28 DIAGNOSIS — Z95810 Presence of automatic (implantable) cardiac defibrillator: Secondary | ICD-10-CM

## 2018-08-01 NOTE — Progress Notes (Signed)
EPIC Encounter for ICM Monitoring  Patient Name: Christopher [name] is a 68 y.o. male Date: 08/01/2018 Primary Care Physican: Hoyt Koch, MD Primary Cardiologist:End Electrophysiologist:Allred LastWeight:230lbs Today's Weight: 234 lbs  Heart failure questions reviewed.  He reported feeling fine.  He has gained weight since the holidays but does not think it is fluid weight.  Thoracic impedance normal.  No diuretic  Recommendations: No changes and encouraged to call for fluid symptoms.   Follow-up plan: ICM clinic phone appointment on2/25/2020  Copy of ICM check sent to Dr.Allred.  3 month trend:     1 year       Rosalene Billings, RN 08/01/2018 12:09 PM

## 2018-08-18 ENCOUNTER — Ambulatory Visit (INDEPENDENT_AMBULATORY_CARE_PROVIDER_SITE_OTHER): Payer: Medicare Other

## 2018-08-18 DIAGNOSIS — I428 Other cardiomyopathies: Secondary | ICD-10-CM | POA: Diagnosis not present

## 2018-08-19 ENCOUNTER — Telehealth: Payer: Self-pay

## 2018-08-19 NOTE — Telephone Encounter (Signed)
Spoke with patient to remind of missed remote transmission 

## 2018-08-20 LAB — CUP PACEART REMOTE DEVICE CHECK
Battery Remaining Longevity: 123 mo
Battery Voltage: 3.02 V
Brady Statistic RV Percent Paced: 0.16 %
Date Time Interrogation Session: 20200214165440
HighPow Impedance: 75 Ohm
Implantable Lead Implant Date: 20181102
Implantable Lead Location: 753860
Implantable Pulse Generator Implant Date: 20181102
Lead Channel Impedance Value: 513 Ohm
Lead Channel Impedance Value: 646 Ohm
Lead Channel Pacing Threshold Amplitude: 0.625 V
Lead Channel Pacing Threshold Pulse Width: 0.4 ms
Lead Channel Sensing Intrinsic Amplitude: 5.5 mV
Lead Channel Sensing Intrinsic Amplitude: 5.5 mV
Lead Channel Setting Pacing Amplitude: 2 V
Lead Channel Setting Pacing Pulse Width: 0.4 ms
Lead Channel Setting Sensing Sensitivity: 0.3 mV

## 2018-08-30 ENCOUNTER — Ambulatory Visit (INDEPENDENT_AMBULATORY_CARE_PROVIDER_SITE_OTHER): Payer: Medicare Other

## 2018-08-30 DIAGNOSIS — I5022 Chronic systolic (congestive) heart failure: Secondary | ICD-10-CM | POA: Diagnosis not present

## 2018-08-30 DIAGNOSIS — Z95810 Presence of automatic (implantable) cardiac defibrillator: Secondary | ICD-10-CM | POA: Diagnosis not present

## 2018-08-30 NOTE — Progress Notes (Signed)
Remote ICD transmission.   

## 2018-08-31 NOTE — Progress Notes (Signed)
EPIC Encounter for ICM Monitoring  Patient Name: Christopher [name] is a 68 y.o. male Date: 08/31/2018 Primary Care Physican: Hoyt Koch, MD Primary Cardiologist:End Electrophysiologist:Allred LastWeight:234lbs Today's Weight: 234 lbs  Heart failure questions reviewed.  He reported feeling fine.    Thoracic impedance normal.  No diuretic  Recommendations: No changes and encouraged to call for fluid symptoms.   Follow-up plan: ICM clinic phone appointment on3/30/2020  Copy of ICM check sent to Dr.Allred.  3 month ICM trend: 08/30/2018    1 Year ICM trend:       Rosalene Billings, RN 08/31/2018 9:15 AM

## 2018-09-22 ENCOUNTER — Telehealth: Payer: Self-pay | Admitting: *Deleted

## 2018-09-22 ENCOUNTER — Telehealth: Payer: Self-pay | Admitting: Cardiology

## 2018-09-22 NOTE — Telephone Encounter (Signed)
[name], MD  Physician  Cardiology  Telephone Encounter  Signed  Encounter Date:  09/22/2018          Signed         Show:Clear all [x] Manual[x] Template[] Copied  Added by: [x] [name], MD  [] Hover for details The patient is scheduled to be seen on September 30, 2018, his chart was reviewed, and unless he has new or worsening symptoms his appointment can be postponed to 6 to 12 weeks.  Christopher Dawley, MD 09/22/2018

## 2018-09-22 NOTE — Telephone Encounter (Signed)
   Primary Cardiologist:  Dr. Meda Coffee   Patient contacted.  History reviewed.  No symptoms to suggest any unstable cardiac conditions.  Based on discussion, with current pandemic situation, we will be postponing this appointment for 6-12 weeks out with Dr Meda Coffee .  If symptoms change, he has been instructed to contact our office.   Routing to C19 CANCEL pool for tracking (P CV DIV CV19 CANCEL) and assigning priority (1 = 4-6 wks, 2 = 6-12 wks, 3 = >12 wks).  Christopher, Kreiter, LPN  1/50/5697 9:48 PM          Cardiac Questionnaire:    Since your last visit or hospitalization:    1. Have you been having new or worsening chest pain? NO   2. Have you been having new or worsening shortness of breath? NO 3. Have you been having new or worsening leg swelling, wt gain, or increase in abdominal girth (pants fitting more tightly)? NO   4. Have you had any passing out spells? NO  Pt states he is good on refills at this time, and will notify our office if he starts to run low. Pt more than gracious for all the assistance provided.

## 2018-09-22 NOTE — Telephone Encounter (Signed)
The patient is scheduled to be seen on September 30, 2018, his chart was reviewed, and unless he has new or worsening symptoms his appointment can be postponed to 6 to 12 weeks.  Ena Dawley, MD 09/22/2018

## 2018-09-30 ENCOUNTER — Ambulatory Visit: Payer: Medicare Other | Admitting: Cardiology

## 2018-10-03 ENCOUNTER — Ambulatory Visit (INDEPENDENT_AMBULATORY_CARE_PROVIDER_SITE_OTHER): Payer: Medicare Other

## 2018-10-03 ENCOUNTER — Other Ambulatory Visit: Payer: Self-pay

## 2018-10-03 DIAGNOSIS — I5022 Chronic systolic (congestive) heart failure: Secondary | ICD-10-CM | POA: Diagnosis not present

## 2018-10-03 DIAGNOSIS — Z95810 Presence of automatic (implantable) cardiac defibrillator: Secondary | ICD-10-CM | POA: Diagnosis not present

## 2018-10-04 NOTE — Progress Notes (Signed)
EPIC Encounter for ICM Monitoring  Patient Name: Christopher [name] is a 68 y.o. male Date: 10/04/2018 Primary Care Physican: Hoyt Koch, MD Primary Cardiologist:End Electrophysiologist:Allred LastWeight:234lbs 10/04/2018 Weight: 234 lbs  Heart failure questions reviewed. He reported feeling fine.  Patient will be changing to Dr Meda Coffee due to Dr End will only be seeing patient at [facility] office.  Office visit with Dr Meda Coffee postponed due to COVID-19.  Thoracic impedance normal.  No diuretic  Recommendations: No changes and encouraged to call for fluid symptoms.  Follow-up plan: ICM clinic phone appointment on5/13/2020.  Copy of ICM check sent to Dr.Allred.  3 month ICM trend: 10/03/2018    1 Year ICM trend:       Rosalene Billings, RN 10/04/2018 10:38 AM

## 2018-10-18 NOTE — Telephone Encounter (Signed)
Virtual Visit Pre-Appointment Phone Call   TELEPHONE CALL NOTE  Christopher [name] has been deemed a candidate for a follow-up tele-health visit to limit community exposure during the Covid-19 pandemic. I spoke with the patient via phone to ensure availability of phone/video source, confirm preferred email & phone number, and discuss instructions and expectations.  I reminded Christopher [name] to be prepared with any vital sign and/or heart rhythm information that could potentially be obtained via home monitoring, at the time of his visit. I reminded Christopher [name] to expect a phone call at the time of his visit if his visit.  Patient agrees to consent below.  Patient does not have a smartphone, therefore will be a PHONE Visit.  Christopher Gustin, RN 10/18/2018 3:28 PM   INSTRUCTIONS FOR DOWNLOADING THE [place] APP TO SMARTPHONE  - If Apple, ask patient to go to App Store and type in WebEx in the search bar. [place] [employer], the blue/green circle. If Android, go to [employer] and type in [employer] in the search bar. The app is free but as with any other app downloads, their phone may require them to verify saved payment information or Apple/Android password.  - The patient does NOT have to create an account. - On the day of the visit, the assist will walk the patient through joining the meeting with the meeting number/password.  INSTRUCTIONS FOR DOWNLOADING THE MYCHART APP TO SMARTPHONE  - If Apple, go to [employer] and type in MyChart in the search bar and download the app. If Android, ask patient to go to Kell[employer] and type in [place] in the search bar and download the app. The app is free but as with any other app downloads, their phone may require them to verify saved payment information or Apple/Android password.  - The patient will need to then log into the app with their MyChart username and password, and select [facility] as their healthcare provider to link the  account. When it is time for your visit, go to the MyChart app, find appointments, and click Begin Video Visit. Be sure to Select Allow for your device to access the Microphone and Camera for your visit. You will then be connected, and your provider will be with you shortly.  **If they have any issues connecting, or need assistance please contact [facility] [phone])**  **If using a computer, in order to ensure the best quality for your visit they will need to use either of the following Internet Browsers: Microsoft [place], or Google Chrome**  [place]   I hereby voluntarily request, consent and authorize [facility] and its employed or contracted physicians, physician assistants, nurse practitioners or other licensed health care professionals (the Practitioner), to provide me with telemedicine health care services (the Services") as deemed necessary by the treating Practitioner. I acknowledge and consent to receive the Services by the Practitioner via telemedicine. I understand that the telemedicine visit will involve communicating with the Practitioner through live audiovisual communication technology and the disclosure of certain medical information by electronic transmission. I acknowledge that I have been given the opportunity to request an in-person assessment or other available alternative prior to the telemedicine visit and am voluntarily participating in the telemedicine visit.  I understand that I have the right to withhold or withdraw my consent to the use of telemedicine in the course of my care at any time, without affecting my  right to future care or treatment, and that the Practitioner or I may terminate the telemedicine visit at any time. I understand that I have the right to inspect all information obtained and/or recorded in the course of the telemedicine visit and may receive copies of available information for a reasonable  fee.  I understand that some of the potential risks of receiving the Services via telemedicine include:   Delay or interruption in medical evaluation due to technological equipment failure or disruption;  Information transmitted may not be sufficient (e.g. poor resolution of images) to allow for appropriate medical decision making by the Practitioner; and/or   In rare instances, security protocols could fail, causing a breach of personal health information.  Furthermore, I acknowledge that it is my responsibility to provide information about my medical history, conditions and care that is complete and accurate to the best of my ability. I acknowledge that Practitioner's advice, recommendations, and/or decision may be based on factors not within their control, such as incomplete or inaccurate data provided by me or distortions of diagnostic images or specimens that may result from electronic transmissions. I understand that the practice of medicine is not an exact science and that Practitioner makes no warranties or guarantees regarding treatment outcomes. I acknowledge that I will receive a copy of this consent concurrently upon execution via email to the email address I last provided but may also request a printed copy by calling the office of [facility].    I understand that my insurance will be billed for this visit.   I have read or had this consent read to me.  I understand the contents of this consent, which adequately explains the benefits and risks of the Services being provided via telemedicine.   I have been provided ample opportunity to ask questions regarding this consent and the Services and have had my questions answered to my satisfaction.  I give my informed consent for the services to be provided through the use of telemedicine in my medical care  By participating in this telemedicine visit I agree to the above.

## 2018-10-22 ENCOUNTER — Other Ambulatory Visit: Payer: Self-pay | Admitting: Internal Medicine

## 2018-10-25 ENCOUNTER — Other Ambulatory Visit: Payer: Self-pay

## 2018-10-26 ENCOUNTER — Encounter: Payer: Self-pay | Admitting: Gastroenterology

## 2018-10-26 ENCOUNTER — Encounter: Payer: Self-pay | Admitting: Physician Assistant

## 2018-10-26 ENCOUNTER — Telehealth (INDEPENDENT_AMBULATORY_CARE_PROVIDER_SITE_OTHER): Payer: Medicare Other | Admitting: Physician Assistant

## 2018-10-26 ENCOUNTER — Ambulatory Visit (INDEPENDENT_AMBULATORY_CARE_PROVIDER_SITE_OTHER): Payer: Medicare Other | Admitting: Gastroenterology

## 2018-10-26 ENCOUNTER — Ambulatory Visit: Payer: Medicare Other | Admitting: Gastroenterology

## 2018-10-26 ENCOUNTER — Other Ambulatory Visit: Payer: Self-pay

## 2018-10-26 VITALS — Ht 70.0 in | Wt 240.0 lb

## 2018-10-26 VITALS — Ht 70.0 in | Wt 245.0 lb

## 2018-10-26 DIAGNOSIS — R1030 Lower abdominal pain, unspecified: Secondary | ICD-10-CM

## 2018-10-26 DIAGNOSIS — K5900 Constipation, unspecified: Secondary | ICD-10-CM | POA: Diagnosis not present

## 2018-10-26 DIAGNOSIS — I4901 Ventricular fibrillation: Secondary | ICD-10-CM

## 2018-10-26 DIAGNOSIS — I2510 Atherosclerotic heart disease of native coronary artery without angina pectoris: Secondary | ICD-10-CM

## 2018-10-26 DIAGNOSIS — E785 Hyperlipidemia, unspecified: Secondary | ICD-10-CM

## 2018-10-26 DIAGNOSIS — I480 Paroxysmal atrial fibrillation: Secondary | ICD-10-CM

## 2018-10-26 DIAGNOSIS — I469 Cardiac arrest, cause unspecified: Secondary | ICD-10-CM

## 2018-10-26 DIAGNOSIS — I428 Other cardiomyopathies: Secondary | ICD-10-CM | POA: Diagnosis not present

## 2018-10-26 DIAGNOSIS — E782 Mixed hyperlipidemia: Secondary | ICD-10-CM

## 2018-10-26 DIAGNOSIS — I5022 Chronic systolic (congestive) heart failure: Secondary | ICD-10-CM

## 2018-10-26 DIAGNOSIS — I38 Endocarditis, valve unspecified: Secondary | ICD-10-CM

## 2018-10-26 DIAGNOSIS — I10 Essential (primary) hypertension: Secondary | ICD-10-CM

## 2018-10-26 MED ORDER — ICOSAPENT ETHYL 1 G PO CAPS: 2.0000 g | ORAL_CAPSULE | Freq: Two times a day (BID) | ORAL | 3 refills | Status: DC

## 2018-10-26 NOTE — Patient Instructions (Addendum)
Medication Instructions:  Your physician has recommended you make the following change in your medication:   1. START: vascepa 2 grams twice a day  2. STOP: Over the Counter Fish Oil once you start Vascepa   Lab work: Your physician recommends that you return for a FASTING lipid profile and liver function panel on 12/26/18    If you have labs (blood work) drawn today and your tests are completely normal, you will receive your results only by: Marland Kitchen MyChart Message (if you have MyChart) OR . A paper copy in the mail If you have any lab test that is abnormal or we need to change your treatment, we will call you to review the results.  Testing/Procedures: None ordered  Follow-Up: At [facility], you and your health needs are our priority.  As part of our continuing mission to provide you with exceptional heart care, we have created designated Provider Care Teams.  These Care Teams include your primary Cardiologist (physician) and Advanced Practice Providers (APPs -  Physician Assistants and Nurse Practitioners) who all work together to provide you with the care you need, when you need it. . You will need a follow up appointment in 7 months.  Please call our office 2 months in advance to schedule this appointment.  You may see Ena Dawley, MD or one of the following Advanced Practice Providers on your designated Care Team:   . Lyda Jester, PA-C . Dayna Dunn, PA-C . Ermalinda Barrios, PA-C  Any Other Special Instructions Will Be Listed Below (If Applicable).

## 2018-10-26 NOTE — Patient Instructions (Signed)
Will contact you to schedule your Colonoscopy when the schedule becomes available.

## 2018-10-26 NOTE — Progress Notes (Addendum)
History of Present Illness: This is a 68 year old male referred by Hoyt Koch, * for the evaluation of worsening severe constipation, intermittent abdominal pain and occasional diarrhea. He has a history of CAD, subacute bacterial endocarditis, MR, S/P MV repair, ICD, cardiomyopathy, LVEF=50-55%, afib, OSA, DM, HTN.  He reports that blood was found in his stool about 10 years ago and he was referred to Dr. Collene Mares for colonoscopy however it was advised that it be rescheduled and it was never completed.  Last colonoscopy was performed in 2005 as below.  He relates 2 to 3 years of worsening problems with constipation with associated lower abdominal pain.  At times his lower abdominal pain has been severe enough that he thought he was going to need to go to the emergency department.  He takes senna as needed for constipation.  He has had occasions where he has had mild diarrhea however worsening constipation is the predominant problem.  He had some dietary changes when he was diagnosed with diabetes but otherwise no other dietary changes. Denies weight loss, change in stool caliber, melena, hematochezia, nausea, vomiting, dysphagia, reflux symptoms, chest pain.    Colonoscopy 2005 showed mild sigmoid colon diverticulosis, internal hemorrhoids    Allergies  Allergen Reactions  . Codeine     Causes bad constipation  . Dust Mite Extract Cough  . Mold Extract [Trichophyton] Cough  . Pollen Extract Cough   Outpatient Medications Prior to Visit  Medication Sig Dispense Refill  . amLODipine (NORVASC) 5 MG tablet TAKE 1 TABLET BY MOUTH  DAILY 90 tablet 3  . aspirin EC 81 MG tablet Take 81 mg by mouth daily.    Marland Kitchen atorvastatin (LIPITOR) 40 MG tablet Take 1 tablet (40 mg total) by mouth daily. 90 tablet 3  . cetirizine (ZYRTEC) 10 MG tablet Take 10 mg by mouth daily.    . fluticasone (FLONASE) 50 MCG/ACT nasal spray USE 2 SPRAYS IN EACH  NOSTRIL DAILY 48 g 1  . metFORMIN (GLUCOPHAGE) 500 MG  tablet Take 1 tablet (500 mg total) by mouth 2 (two) times daily with a meal. 180 tablet 3  . metoprolol succinate (TOPROL-XL) 100 MG 24 hr tablet Take with or immediately following a meal. 90 tablet 3  . Omega-3 Fatty Acids (FISH OIL) 1000 MG CAPS Take 1,000 capsules by mouth 3 (three) times daily.    . ramipril (ALTACE) 10 MG capsule Take 1 capsule (10 mg total) by mouth daily. 90 capsule 3  . tadalafil (ADCIRCA/CIALIS) 20 MG tablet Take 0.5-1 tablets (10-20 mg total) by mouth every other day as needed for erectile dysfunction. 10 tablet 11  . tamsulosin (FLOMAX) 0.4 MG CAPS capsule Take 1 capsule (0.4 mg total) by mouth daily. 90 capsule 3   No facility-administered medications prior to visit.    Past Medical History:  Diagnosis Date  . Arthritis   . Atrial fibrillation ([place])    post op, intol of anticoag  . Cardiac arrest [facility] [place])    a. s/p MDT single chamber ICD  . Coronary artery disease    a. 2/7 Cath: LM nl, LAD min irregs, LCX min irregs, RI 40, RCA 94m, EF 55-60% basal to mid inf HK, 3-4+ MR;  b. 08/25/2012 PCI of RCA with 4.0x15 Vision BMS  . GERD (gastroesophageal reflux disease)    hx  . Hyperlipidemia    on statin  . Hypertension   . S/P mitral valve repair 09/27/2012   Complex valvuloplasty including triangular resection  of flail posterior leaflet with 30 mm Sorin Memo 3D ring annuloplasty via right mini thoracotomy approach  . Severe mitral regurgitation    a. Mitral valve prolapse with flail segment of posterior leaflet and severe MR by TEE, remote h/o bacterial endocarditis   . Sleep apnea    NPSG 01/21/06- AHI 40.7/hr cpap  . 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 AMPUTATION  07/28/2012   Procedure: AMPUTATION DIGIT;  Surgeon: Colin Rhein, MD;  Location: [facility];  Service: Orthopedics;  Laterality: Right;  2nd toe  . CARDIAC CATHETERIZATION    . EP IMPLANTABLE DEVICE N/A 05/01/2016    Procedure: Loop Recorder Removal;  Surgeon: Thompson Grayer, MD;  Location: [facility] CV LAB;  Service: Cardiovascular;  Laterality: N/A;  . FOOT SURGERY     BILATERAL TOES  . ICD IMPLANT N/A 05/07/2017    Medtronic Visia AF MRI VR SureScan implanted by Dr Curt Bears following VF arrest  . Implantable loop recorder placement  03/31/13   MDT Linq implanted by Dr Rayann Heman to evaluate for further afib  . INTRAOPERATIVE TRANSESOPHAGEAL ECHOCARDIOGRAM N/A 09/27/2012   Procedure: INTRAOPERATIVE TRANSESOPHAGEAL ECHOCARDIOGRAM;  Surgeon: [name], MD;  Location: [facility];  Service: Open Heart Surgery;  Laterality: N/A;  . LEFT AND RIGHT HEART CATHETERIZATION WITH CORONARY ANGIOGRAM N/A 08/12/2012   Procedure: LEFT AND RIGHT HEART CATHETERIZATION WITH CORONARY ANGIOGRAM;  Surgeon: [name], MD;  Location: [facility] CATH LAB;  Service: Cardiovascular;  Laterality: N/A;  . LEFT HEART CATH AND CORONARY ANGIOGRAPHY N/A 05/06/2017   Procedure: LEFT HEART CATH AND CORONARY ANGIOGRAPHY;  Surgeon: [name], MD;  Location: [facility] CV LAB;  Service: Cardiovascular;  Laterality: N/A;  . LOOP RECORDER IMPLANT N/A 03/31/2013   Procedure: LOOP RECORDER IMPLANT;  Surgeon: [name], MD;  Location: [facility] CATH LAB;  Service: Cardiovascular;  Laterality: N/A;  . MITRAL VALVE REPAIR Right 09/27/2012   Procedure: MINIMALLY INVASIVE MITRAL VALVE REPAIR (MVR);  Surgeon: [name], MD;  Location: [facility];  Service: Open Heart Surgery;  Laterality: Right;  Ultrasound guided  . PERCUTANEOUS CORONARY STENT INTERVENTION (PCI-S) N/A 08/25/2012   Procedure: PERCUTANEOUS CORONARY STENT INTERVENTION (PCI-S);  Surgeon: Sherren Mocha, MD;  Location[facility] CATH LAB;  Service: Cardiovascular;  Laterality: N/A;  . SEPTOPLASTY     Dr. [name]  . TEE WITHOUT CARDIOVERSION N/A 08/12/2012   Procedure: TRANSESOPHAGEAL ECHOCARDIOGRAM (TEE);  Surgeon: [name], MD;  Location: [facility];  Service: Cardiovascular;  Laterality: N/A;   . TONSILLECTOMY    . UVULOPALATOPHARYNGOPLASTY     Social History   Socioeconomic History  . Marital status: Divorced    Spouse name: Not on file  . Number of children: Not on file  . Years of education: Not on file  . Highest education level: Not on file  Occupational History  . Occupation: Works at [employer]  . Financial resource strain: Not on file  . Food insecurity:    Worry: Not on file    Inability: Not on file  . Transportation needs:    Medical: Not on file    Non-medical: Not on file  Tobacco Use  . Smoking status: Former Smoker    Packs/day: 2.50    Years: 5.00    Pack years: 12.50    Types: Cigarettes    Last attempt to quit: 07/06/1978    Years since quitting: 40.3  .  Smokeless tobacco: Never Used  . Tobacco comment: social drinker  Substance and Sexual Activity  . Alcohol use: Yes    Comment: OCC.  . Drug use: No  . Sexual activity: Yes  Lifestyle  . Physical activity:    Days per week: Not on file    Minutes per session: Not on file  . Stress: Not on file  Relationships  . Social connections:    Talks on phone: Not on file    Gets together: Not on file    Attends religious service: Not on file    Active member of club or organization: Not on file    Attends meetings of clubs or organizations: Not on file    Relationship status: Not on file  Other Topics Concern  . Not on file  Social History Narrative   Works at [employer] - plans to retire 07/2015 after 36 years.   Divorced, lives alone   Supportive g-friend (shirlee moore)         Family History  Problem Relation Age of Onset  . Heart disease Father   . Colon cancer Father 88  . Diabetes Father   . Renal cancer Father   . Diabetes Mother   . Stroke Mother   . Sudden death Brother   . Heart attack Brother        Review of Systems: Pertinent positive and negative review of systems were noted in the above HPI section. All other review of systems were otherwise negative.     Physical Exam: Telemedicine - not performed   Assessment and Recommendations:  1. Worsening constipation associated with episodic lower abdominal pain which is occasionally severe. Family history of colon cancer. He also notes occasional diarrhea however worsening constipation with pain is his predominant problem. Last colonoscopy in 2005.  Rule out colorectal neoplasms worsening diverticulosis, other disorders.  Increase daily water and dietary fiber intake.  Begin MiraLAX once daily.  Senna as needed.  Schedule colonoscopy as urgent in May.  Currently under COVID-19 pandemic scheduling restrictions. The risks (including bleeding, perforation, infection, missed lesions, medication reactions and possible hospitalization or surgery if complications occur), benefits, and alternatives to colonoscopy with possible biopsy and possible polypectomy were discussed with the patient and they consent to proceed.    These services were provided via telemedicine, audio only per patient request.  The patient was at home and the provider was in the office, alone.  We discussed the limitations of evaluation and management by telemedicine and the availability of in person appointments.  Patient consented for this telemedicine visit and is aware of possible charges for this service.  The other person participating in the telemedicine service was Marlon Pel, [place] who reviewed medications, allergies, past history and completed AVS.  Time spent on call: 12 minutes    cc: Hoyt Koch, MD [address] [place], [place] [address]

## 2018-10-26 NOTE — Progress Notes (Signed)
Virtual Visit via Telephone Note   This visit type was conducted due to national recommendations for restrictions regarding the COVID-19 Pandemic (e.g. social distancing) in an effort to limit this patient's exposure and mitigate transmission in our community.  Due to his co-morbid illnesses, this patient is at least at moderate risk for complications without adequate follow up.  This format is felt to be most appropriate for this patient at this time.  The patient did not have access to video technology/had technical difficulties with video requiring transitioning to audio format only (telephone).  All issues noted in this document were discussed and addressed.  No physical exam could be performed with this format.  Please refer to the patient's chart for his  consent to telehealth for [facility].   Evaluation Performed:  Follow-up visit  Date:  10/26/2018   ID:  Christopher [name], Christopher [name] 1950-12-13, MRN [id number]  Patient Location: Home Provider Location: Home  PCP:  Hoyt Koch, MD  Cardiologist:  Ena Dawley, MD  Electrophysiologist:  Thompson Grayer, MD   Chief Complaint:  f/u  History of Present Illness:    Christopher [name] is a 68 y.o. male with history of CAD S/P PCI RCA 2014, SB and severe MR status post repair in 2014,  postop atrial fibrillation, hypertension, HLD, OSA.  Syncopal episode 04/2017 successfully defibrillated with [facility] underwent ICD.  Left heart cath that admission showed nonobstructive CAD with moderately reduced LVEF.  Patient last saw Dr. Saunders Revel 03/18/2018 at which time he was doing well.  He was euvolemic.  2D echo 10/2017 LVEF low normal 50 to 55%.  Blood pressure was borderline elevated recommended increasing ramipril if it remains elevated.  He was in normal sinus rhythm.  Patient is transitioning his care to Dr. Meda Coffee since Dr.End  moved to [facility].  Patient thoracic impedance is normal 10/03/2018.   Patient denies chest pain, dyspnea, dyspnea on  exertion, dizziness or presyncope, edema. No exercise since [employer] closed down. Trouble with allergies so can't walk with all the pollen. Following a low salt diet.  The patient does not have symptoms concerning for COVID-19 infection (fever, chills, cough, or new shortness of breath).    Past Medical History:  Diagnosis Date  . Arthritis   . Atrial fibrillation ([place])    post op, intol of anticoag  . Cardiac arrest [facility])    a. s/p MDT single chamber ICD  . Coronary artery disease    a. 2/7 Cath: LM nl, LAD min irregs, LCX min irregs, RI 40, RCA 8m, EF 55-60% basal to mid inf HK, 3-4+ MR;  b. 08/25/2012 PCI of RCA with 4.0x15 Vision BMS  . GERD (gastroesophageal reflux disease)    hx  . Hyperlipidemia    on statin  . Hypertension   . 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 Severe mitral regurgitation    a. Mitral valve prolapse with flail segment of posterior leaflet and severe MR by TEE, remote h/o bacterial endocarditis   . Sleep apnea    NPSG 01/21/06- AHI 40.7/hr cpap  . 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 AMPUTATION  07/28/2012   Procedure: AMPUTATION DIGIT;  Surgeon: Colin Rhein, MD;  Location: [facility];  Service: Orthopedics;  Laterality: Right;  2nd toe  .  CARDIAC CATHETERIZATION    . EP IMPLANTABLE DEVICE N/A 05/01/2016   Procedure: Loop Recorder Removal;  Surgeon: Thompson Grayer, MD;  Location: [place] CV LAB;  Service: Cardiovascular;  Laterality: N/A;  . FOOT SURGERY     BILATERAL TOES  . ICD IMPLANT N/A 05/07/2017    Medtronic Visia AF MRI VR SureScan implanted by Dr Curt Bears following VF arrest  . Implantable loop recorder placement  03/31/13   MDT Linq implanted by Dr Rayann Heman to evaluate for further afib  . INTRAOPERATIVE  TRANSESOPHAGEAL ECHOCARDIOGRAM N/A 09/27/2012   Procedure: INTRAOPERATIVE TRANSESOPHAGEAL ECHOCARDIOGRAM;  Surgeon: [name], MD;  Location: [place];  Service: Open Heart Surgery;  Laterality: N/A;  . LEFT AND RIGHT HEART CATHETERIZATION WITH CORONARY ANGIOGRAM N/A 08/12/2012   Procedure: LEFT AND RIGHT HEART CATHETERIZATION WITH CORONARY ANGIOGRAM;  Surgeon: [name], MD;  Location: [facility] CATH LAB;  Service: Cardiovascular;  Laterality: N/A;  . LEFT HEART CATH AND CORONARY ANGIOGRAPHY N/A 05/06/2017   Procedure: LEFT HEART CATH AND CORONARY ANGIOGRAPHY;  Surgeon: [name], MD;  Location: [place] CV LAB;  Service: Cardiovascular;  Laterality: N/A;  . LOOP RECORDER IMPLANT N/A 03/31/2013   Procedure: LOOP RECORDER IMPLANT;  Surgeon: [name], MD;  Location: [place] CATH LAB;  Service: Cardiovascular;  Laterality: N/A;  . MITRAL VALVE REPAIR Right 09/27/2012   Procedure: MINIMALLY INVASIVE MITRAL VALVE REPAIR (MVR);  Surgeon: [name], MD;  Location: [place];  Service: Open Heart Surgery;  Laterality: Right;  Ultrasound guided  . PERCUTANEOUS CORONARY STENT INTERVENTION (PCI-S) N/A 08/25/2012   Procedure: PERCUTANEOUS CORONARY STENT INTERVENTION (PCI-S);  Surgeon: Sherren Mocha, MD;  Location: [facility] CATH LAB;  Service: Cardiovascular;  Laterality: N/A;  . SEPTOPLASTY     Dr. [name]  . TEE WITHOUT CARDIOVERSION N/A 08/12/2012   Procedure: TRANSESOPHAGEAL ECHOCARDIOGRAM (TEE);  Surgeon: [name], MD;  Location: [place];  Service: Cardiovascular;  Laterality: N/A;  . TONSILLECTOMY    . UVULOPALATOPHARYNGOPLASTY       Current Meds  Medication Sig  . amLODipine (NORVASC) 5 MG tablet TAKE 1 TABLET BY MOUTH  DAILY  . aspirin EC 81 MG tablet Take 81 mg by mouth daily.  Marland Kitchen atorvastatin (LIPITOR) 40 MG tablet Take 1 tablet (40 mg total) by mouth daily.  . cetirizine (ZYRTEC) 10 MG tablet Take 10 mg by mouth daily.  . fluticasone (FLONASE) 50 MCG/ACT nasal spray USE 2 SPRAYS  IN EACH  NOSTRIL DAILY  . metFORMIN (GLUCOPHAGE) 500 MG tablet Take 1 tablet (500 mg total) by mouth 2 (two) times daily with a meal.  . metoprolol succinate (TOPROL-XL) 100 MG 24 hr tablet Take with or immediately following a meal.  . ramipril (ALTACE) 10 MG capsule Take 1 capsule (10 mg total) by mouth daily.  . tadalafil (ADCIRCA/CIALIS) 20 MG tablet Take 0.5-1 tablets (10-20 mg total) by mouth every other day as needed for erectile dysfunction.  . tamsulosin (FLOMAX) 0.4 MG CAPS capsule Take 1 capsule (0.4 mg total) by mouth daily.  . [DISCONTINUED] Omega-3 Fatty Acids (FISH OIL) 1000 MG CAPS Take 1,000 capsules by mouth 3 (three) times daily.     Allergies:   Codeine; Dust mite extract; and Pollen extract   Social History   Tobacco Use  . Smoking status: Former Smoker    Packs/day: 2.50    Years: 5.00    Pack years: 12.50    Types: Cigarettes    Last attempt to quit: 07/06/1978  Years since quitting: 40.3  . Smokeless tobacco: Never Used  . Tobacco comment: social drinker  Substance Use Topics  . Alcohol use: Yes    Comment: OCC.  . Drug use: No     Family Hx: The patient's family history includes Colon cancer (age of onset: 31) in his father; Diabetes in his father and mother; Heart attack in his brother; Heart disease in his father; Renal cancer in his father; Stroke in his mother; Sudden death in his brother.  ROS:   Please see the history of present illness.    Review of Systems  Constitution: Negative.  HENT: Negative.        Seasonal allergies  Cardiovascular: Negative.   Respiratory: Negative.   Endocrine: Negative.   Hematologic/Lymphatic: Negative.   Musculoskeletal: Negative.   Gastrointestinal: Negative.   Genitourinary: Negative.   Neurological: Negative.     All other systems reviewed and are negative.   Prior CV studies:   The following studies were reviewed today: 2Decho 4/1/19Study Conclusions   - Left ventricle: Inferoseptal hypokinesis.  Compared to echo from   Nov 2018, LVEF is mildly improved. The cavity size was severely   dilated. Wall thickness was increased in a pattern of mild LVH.   Systolic function was normal. The estimated ejection fraction was   in the range of 50% to 55%. - Aortic valve: There was trivial regurgitation. - Mitral valve: s/p MV annuloplasty. Calcified annulus. Mildly   thickened leaflets . Valve area by pressure half-time: 2.06 cm^2.   Valve area by continuity equation (using LVOT flow): 1.42 cm^2.   Cardiac cath 05/2017   Labs/Other Tests and Data Reviewed:    EKG:  An ECG dated 03/18/2018 was personally reviewed today and demonstrated:  Normal sinus rhythm with rare bundle branch block and inferior Q waves  Recent Labs: 05/19/2018: ALT 18; BUN 18; Creatinine, Ser 0.97; Hemoglobin 15.0; Platelets 156.0; Potassium 3.3; Sodium 138   Recent Lipid Panel Lab Results  Component Value Date/Time   CHOL 130 05/19/2018 01:28 PM   CHOL 115 01/10/2018 07:17 AM   TRIG 336.0 (H) 05/19/2018 01:28 PM   HDL 34.20 (L) 05/19/2018 01:28 PM   HDL 32 (L) 01/10/2018 07:17 AM   CHOLHDL 4 05/19/2018 01:28 PM   LDLCALC 52 01/10/2018 07:17 AM   LDLDIRECT 64.0 05/19/2018 01:28 PM    Wt Readings from Last 3 Encounters:  10/26/18 240 lb (108.9 kg)  10/26/18 245 lb (111.1 kg)  05/19/18 237 lb (107.5 kg)     Objective:    Vital Signs:  Ht 5\' 10"  (1.778 m)   Wt 240 lb (108.9 kg)   BMI 34.44 kg/m      ASSESSMENT & PLAN:    1. Nonischemic cardiomyopathy, most recent EF 50-55% on echo 10/2017-no heart failure symptoms 2. Cardiac arrest 04/2017 with successful defibrillations status post ICD.  Left heart catheterization showed nonobstructive CAD-no chest pain 3. Chronic systolic CHF most recent EF 50 to 55% on echo 10/2017 improved.  Thoracic impedance is normal 10/03/2018-no CHF symptoms 4. Valvular heart disease with SBE and severe MR status post repair in 2014 5. CAD status post PCI to the RCA in 2014,  nonobstructive CAD on catheter 2018 6. Essential hypertension-got a BP cuff sent by [place] [facility] but PCP said it didn't work. 7. Paroxysmal atrial fibrillation postop without recurrence 8. Hyperlipidemia LDL 64 triglycerides 336-05/19/2018 fish oil added-diabetes better controlled.  COVID-19 Education: The signs and symptoms of COVID-19 were discussed with the patient  and how to seek care for testing (follow up with PCP or arrange E-visit).   The importance of social distancing was discussed today.  Time:   Today, I have spent 12 minutes with the patient with telehealth technology discussing the above problems.     Medication Adjustments/Labs and Tests Ordered: Current medicines are reviewed at length with the patient today.  Concerns regarding medicines are outlined above.   Tests Ordered: Orders Placed This Encounter  Procedures  . Hepatic function panel  . Lipid panel    Medication Changes: Meds ordered this encounter  Medications  . Icosapent Ethyl (VASCEPA) 1 g CAPS    Sig: Take 2 capsules (2 g total) by mouth 2 (two) times daily.    Dispense:  360 capsule    Refill:  3    BIN# [id number] PCN# [id number] GRP# [id number] ID# [id number]    Disposition:  Follow up in 7 month(s) Dr. Meda Coffee  Signed, Ermalinda Barrios, PA-C  10/26/2018 12:57 PM    [place]

## 2018-10-27 MED ORDER — NA SULFATE-K SULFATE-MG SULF 17.5-3.13-1.6 GM/177ML PO SOLN: 1.0000 | Freq: Once | ORAL | 0 refills | Status: AC

## 2018-10-27 NOTE — Addendum Note (Signed)
Addended by: Marzella Schlein on: 10/27/2018 09:25 AM   Modules accepted: Orders

## 2018-11-08 ENCOUNTER — Telehealth: Payer: Self-pay | Admitting: *Deleted

## 2018-11-08 NOTE — Telephone Encounter (Signed)
Covid-19 travel screening questions  Have you traveled in the last 14 days?no If yes where?  Do you now or have you had a fever in the last 14 days?no   Do you have any respiratory symptoms of shortness of breath or cough now or in the last 14 days?no  Do you have a medical history of Congestive Heart Failure?  Do you have a medical history of lung disease?  Do you have any family members or close contacts with diagnosed or suspected Covid-19?no   Pt was notified of the care partner policy and bringing his own PPE if he has it. Pt verbalized understanding. SM

## 2018-11-10 ENCOUNTER — Other Ambulatory Visit: Payer: Self-pay

## 2018-11-10 ENCOUNTER — Encounter: Payer: Self-pay | Admitting: Gastroenterology

## 2018-11-10 ENCOUNTER — Ambulatory Visit (AMBULATORY_SURGERY_CENTER): Payer: Medicare Other | Admitting: Gastroenterology

## 2018-11-10 VITALS — BP 135/68 | HR 65 | Temp 98.6°F | Resp 13 | Ht 70.0 in | Wt 245.0 lb

## 2018-11-10 DIAGNOSIS — K5900 Constipation, unspecified: Secondary | ICD-10-CM

## 2018-11-10 DIAGNOSIS — K5730 Diverticulosis of large intestine without perforation or abscess without bleeding: Secondary | ICD-10-CM | POA: Diagnosis not present

## 2018-11-10 DIAGNOSIS — D120 Benign neoplasm of cecum: Secondary | ICD-10-CM | POA: Diagnosis not present

## 2018-11-10 DIAGNOSIS — K6289 Other specified diseases of anus and rectum: Secondary | ICD-10-CM | POA: Diagnosis not present

## 2018-11-10 DIAGNOSIS — Z800 Family history of malignant neoplasm of digestive organs: Secondary | ICD-10-CM

## 2018-11-10 DIAGNOSIS — K640 First degree hemorrhoids: Secondary | ICD-10-CM | POA: Diagnosis not present

## 2018-11-10 DIAGNOSIS — D124 Benign neoplasm of descending colon: Secondary | ICD-10-CM

## 2018-11-10 DIAGNOSIS — R1030 Lower abdominal pain, unspecified: Secondary | ICD-10-CM

## 2018-11-10 DIAGNOSIS — D125 Benign neoplasm of sigmoid colon: Secondary | ICD-10-CM | POA: Diagnosis not present

## 2018-11-10 MED ORDER — SODIUM CHLORIDE 0.9 % IV SOLN: 500.0000 mL | Freq: Once | INTRAVENOUS | Status: DC

## 2018-11-10 NOTE — Op Note (Signed)
[facility] Patient Name: Christopher [name] Procedure Date: 11/10/2018 7:58 AM MRN: [id number] Endoscopist: Ladene Artist , MD Age: 68 Referring MD:  Date of Birth: 19-Nov-1950 Gender: Male Account #: [id number] Procedure:                Colonoscopy Indications:              Lower abdominal pain, Constipation, Family history                            of colon cancer, father. Medicines:                Monitored Anesthesia Care Procedure:                Pre-Anesthesia Assessment:                           - Prior to the procedure, a History and Physical                            was performed, and patient medications and                            allergies were reviewed. The patient's tolerance of                            previous anesthesia was also reviewed. The risks                            and benefits of the procedure and the sedation                            options and risks were discussed with the patient.                            All questions were answered, and informed consent                            was obtained. Prior Anticoagulants: The patient has                            taken no previous anticoagulant or antiplatelet                            agents. ASA Grade Assessment: II - A patient with                            mild systemic disease. After reviewing the risks                            and benefits, the patient was deemed in                            satisfactory condition to undergo the procedure.  After obtaining informed consent, the colonoscope                            was passed under direct vision. Throughout the                            procedure, the patient's blood pressure, pulse, and                            oxygen saturations were monitored continuously. The                            Colonoscope was introduced through the anus and                            advanced to the the cecum, identified  by                            appendiceal orifice and ileocecal valve. The                            ileocecal valve, appendiceal orifice, and rectum                            were photographed. The quality of the bowel                            preparation was adequate. The colonoscopy was                            performed without difficulty. The patient tolerated                            the procedure well. Scope In: 8:09:55 AM Scope Out: 8:33:15 AM Scope Withdrawal Time: 0 hours 21 minutes 15 seconds  Total Procedure Duration: 0 hours 23 minutes 20 seconds  Findings:                 The perianal and digital rectal examinations were                            normal.                           Two sessile polyps were found in the sigmoid colon                            and cecum. The polyps were 7 to 8 mm in size. These                            polyps were removed with a cold snare. Resection                            and retrieval were complete.  Two sessile polyps were found in the descending                            colon. The polyps were 4 to 5 mm in size. These                            polyps were removed with a cold biopsy forceps.                            Resection and retrieval were complete.                           A few medium-mouthed diverticula were found in the                            left colon. There was no evidence of diverticular                            bleeding.                           A diffuse area of moderate melanosis was found in                            the entire colon, left colon > right colon.                           Internal hemorrhoids were found during                            retroflexion. The hemorrhoids were small and Grade                            I (internal hemorrhoids that do not prolapse).                           The exam was otherwise without abnormality on                             direct and retroflexion views. Complications:            No immediate complications. Estimated blood loss:                            None. Estimated Blood Loss:     Estimated blood loss: none. Impression:               - Two 7 to 8 mm polyps in the sigmoid colon and in                            the cecum, removed with a cold snare. Resected and                            retrieved.                           -  Two 4 to 5 mm polyps in the descending colon,                            removed with a cold biopsy forceps. Resected and                            retrieved.                           - Mild diverticulosis in the left colon. There was                            no evidence of diverticular bleeding.                           - Melanosis in the colon.                           - Internal hemorrhoids.                           - The examination was otherwise normal on direct                            and retroflexion views. Recommendation:           - Repeat colonoscopy in 5 years for surveillance.                           - Patient has a contact number available for                            emergencies. The signs and symptoms of potential                            delayed complications were discussed with the                            patient. Return to normal activities tomorrow.                            Written discharge instructions were provided to the                            patient.                           - High fiber diet.                           - Continue present medications.                           - Continue Miralax once or twice daily titrated for  adequate bowel movements.                           - Await pathology results. Ladene Artist, MD 11/10/2018 8:39:45 AM This report has been signed electronically.

## 2018-11-10 NOTE — Progress Notes (Signed)
Vital signs taken by [facility] (temperatures) and [facility] Mote (BP, pulse, pulse oximetry)  Called to room to assist during endoscopic procedure.  Patient ID and intended procedure confirmed with present staff. Received instructions for my participation in the procedure from the performing physician.

## 2018-11-10 NOTE — Patient Instructions (Signed)
YOU HAD AN ENDOSCOPIC PROCEDURE TODAY AT [facility]:   Refer to the procedure report that was given to you for any specific questions about what was found during the examination.  If the procedure report does not answer your questions, please call your gastroenterologist to clarify.  If you requested that your care partner not be given the details of your procedure findings, then the procedure report has been included in a sealed envelope for you to review at your convenience later.  YOU SHOULD EXPECT: Some feelings of bloating in the abdomen. Passage of more gas than usual.  Walking can help get rid of the air that was put into your GI tract during the procedure and reduce the bloating. If you had a lower endoscopy (such as a colonoscopy or flexible sigmoidoscopy) you may notice spotting of blood in your stool or on the toilet paper. If you underwent a bowel prep for your procedure, you may not have a normal bowel movement for a few days.  Please Note:  You might notice some irritation and congestion in your nose or some drainage.  This is from the oxygen used during your procedure.  There is no need for concern and it should clear up in a day or so.  SYMPTOMS TO REPORT IMMEDIATELY:   Following lower endoscopy (colonoscopy or flexible sigmoidoscopy):  Excessive amounts of blood in the stool  Significant tenderness or worsening of abdominal pains  Swelling of the abdomen that is new, acute  Fever of 100F or higher  For urgent or emergent issues, a gastroenterologist can be reached at any hour by calling [phone].   DIET:  We do recommend a small meal at first, but then you may proceed to your regular diet.  Drink plenty of fluids but you should avoid alcoholic beverages for 24 hours.  ACTIVITY:  You should plan to take it easy for the rest of today and you should NOT DRIVE or use heavy machinery until tomorrow (because of the sedation medicines used during the test).     FOLLOW UP: Our staff will call the number listed on your records the next business day following your procedure to check on you and address any questions or concerns that you may have regarding the information given to you following your procedure. If we do not reach you, we will leave a message.  However, if you are feeling well and you are not experiencing any problems, there is no need to return our call.  We will assume that you have returned to your regular daily activities without incident. We will be calling you two weeks following your procedure to see if you have developed any symptoms of the COVID-19.  If you develop any symptoms before then, please let us know.  If any biopsies were taken you will be contacted by phone or by letter within the next 1-3 weeks.  Please call us at [phone] if you have not heard about the biopsies in 3 weeks.   Await for biopsy results Polyps (handout given) Diverticulosis (handout given) Hemorrhoids (handout given) Miralax once a day or twice a day titrated for adequate bowel movements  SIGNATURES/CONFIDENTIALITY: You and/or your care partner have signed paperwork which will be entered into your electronic medical record.  These signatures attest to the fact that that the information above on your After Visit Summary has been reviewed and is understood.  Full responsibility of the confidentiality of this discharge information lies with you and/or  your care-partner. 

## 2018-11-10 NOTE — Progress Notes (Signed)
To PACU, VSS. Report to [email or web address] 

## 2018-11-14 ENCOUNTER — Telehealth: Payer: Self-pay

## 2018-11-14 ENCOUNTER — Encounter: Payer: Self-pay | Admitting: Gastroenterology

## 2018-11-14 NOTE — Telephone Encounter (Signed)
  Follow up Call-  Call back number 11/10/2018  Post procedure Call Back phone  # [phone]  Permission to leave phone message Yes  Some recent data might be hidden     Patient questions:  Do you have a fever, pain , or abdominal swelling? No. Pain Score  0 *  Have you tolerated food without any problems? Yes.    Have you been able to return to your normal activities? Yes.    Do you have any questions about your discharge instructions: Diet   No. Medications  No. Follow up visit  No.  Do you have questions or concerns about your Care? No.  Actions: * If pain score is 4 or above: No action needed, pain <4.

## 2018-11-16 ENCOUNTER — Ambulatory Visit (INDEPENDENT_AMBULATORY_CARE_PROVIDER_SITE_OTHER): Payer: Medicare Other

## 2018-11-16 ENCOUNTER — Other Ambulatory Visit: Payer: Self-pay

## 2018-11-16 DIAGNOSIS — Z95810 Presence of automatic (implantable) cardiac defibrillator: Secondary | ICD-10-CM

## 2018-11-16 DIAGNOSIS — I5022 Chronic systolic (congestive) heart failure: Secondary | ICD-10-CM

## 2018-11-17 ENCOUNTER — Telehealth: Payer: Self-pay | Admitting: *Deleted

## 2018-11-17 ENCOUNTER — Encounter: Payer: Medicare Other | Admitting: Internal Medicine

## 2018-11-17 ENCOUNTER — Other Ambulatory Visit: Payer: Self-pay

## 2018-11-17 ENCOUNTER — Ambulatory Visit (INDEPENDENT_AMBULATORY_CARE_PROVIDER_SITE_OTHER): Payer: Medicare Other | Admitting: *Deleted

## 2018-11-17 DIAGNOSIS — I428 Other cardiomyopathies: Secondary | ICD-10-CM | POA: Diagnosis not present

## 2018-11-17 LAB — CUP PACEART REMOTE DEVICE CHECK
Battery Remaining Longevity: 122 mo
Battery Voltage: 3.02 V
Brady Statistic RV Percent Paced: 0.84 %
Date Time Interrogation Session: 20200513052405
HighPow Impedance: 75 Ohm
Implantable Lead Implant Date: 20181102
Implantable Lead Location: 753860
Implantable Pulse Generator Implant Date: 20181102
Lead Channel Impedance Value: 475 Ohm
Lead Channel Impedance Value: 589 Ohm
Lead Channel Pacing Threshold Amplitude: 0.5 V
Lead Channel Pacing Threshold Pulse Width: 0.4 ms
Lead Channel Sensing Intrinsic Amplitude: 5.5 mV
Lead Channel Sensing Intrinsic Amplitude: 5.5 mV
Lead Channel Setting Pacing Amplitude: 2 V
Lead Channel Setting Pacing Pulse Width: 0.4 ms
Lead Channel Setting Sensing Sensitivity: 0.3 mV

## 2018-11-17 NOTE — Telephone Encounter (Signed)
1. Have you developed a fever since your procedure? no  2.   Have you had an respiratory symptoms (SOB or cough) since your procedure? no  3.   Have you tested positive for COVID 19 since your procedure no  3.   Have you had any family members/close contacts diagnosed with the COVID 19 since your procedure?  no   If any of these questions are a yes, please inquire if patient has been seen by family doctor and route this note to Tracy Walton, RN.  

## 2018-11-18 ENCOUNTER — Telehealth: Payer: Self-pay

## 2018-11-18 NOTE — Telephone Encounter (Signed)
Remote ICM transmission received.  Attempted call to patient regarding ICM remote transmission and left detailed message, per DPR, with next ICM remote transmission date of 12/19/2018.  Advised to return call for any fluid symptoms or questions.   

## 2018-11-18 NOTE — Progress Notes (Signed)
EPIC Encounter for ICM Monitoring  Patient Name: DETAVIOUS RINN is a 68 y.o. male Date: 11/18/2018 Primary Care Physican: Hoyt Koch, MD Primary Cardiologist:End Electrophysiologist:Allred LastWeight:234lbs 10/04/2018 Weight: 234 lbs  Attempted call to patient and unable to reach.  Left detailed message per DPR regarding transmission. Transmission reviewed.   Thoracic impedance normal.  No diuretic  Recommendations: Left voice mail with ICM number and encouraged to call if experiencing any fluid symptoms.  Follow-up plan: ICM clinic phone appointment on6/15/2020.  Copy of ICM check sent to Dr.Allred.  3 month ICM trend: 11/16/2018    1 Year ICM trend:        Rosalene Billings, RN 11/18/2018 4:12 PM

## 2018-11-23 NOTE — Progress Notes (Signed)
Remote ICD transmission.   

## 2018-12-19 ENCOUNTER — Ambulatory Visit (INDEPENDENT_AMBULATORY_CARE_PROVIDER_SITE_OTHER): Payer: Medicare Other

## 2018-12-19 DIAGNOSIS — I5022 Chronic systolic (congestive) heart failure: Secondary | ICD-10-CM | POA: Diagnosis not present

## 2018-12-19 DIAGNOSIS — Z95810 Presence of automatic (implantable) cardiac defibrillator: Secondary | ICD-10-CM

## 2018-12-20 NOTE — Progress Notes (Signed)
EPIC Encounter for ICM Monitoring  Patient Name: Christopher [name] is a 68 y.o. male Date: 12/20/2018 Primary Care Physican: Hoyt Koch, MD Primary Cardiologist:End Electrophysiologist:Allred LastWeight:234lbs 6/17/2020Weight: 240 lbs  Spoke with patient and is doing fine.   Thoracic impedance normal.  No diuretic  Recommendations: Encouraged to call for any fluid symptoms.  Follow-up plan: ICM clinic phone appointment on7/20/2020.  Copy of ICM check sent to Dr.Allred.  3 month ICM trend: 12/19/2018    1 Year ICM trend:       Rosalene Billings, RN 12/20/2018 10:17 AM

## 2018-12-23 ENCOUNTER — Telehealth: Payer: Self-pay | Admitting: *Deleted

## 2018-12-23 NOTE — Telephone Encounter (Signed)
    COVID-19 Pre-Screening Questions:  . In the past 7 to 10 days have you had a cough,  shortness of breath, headache, congestion, fever (100 or greater) body aches, chills, sore throat, or sudden loss of taste or sense of smell? . Have you been around anyone with known Covid 19. . Have you been around anyone who is awaiting Covid 19 test results in the past 7 to 10 days? . Have you been around anyone who has been exposed to Covid 19, or has mentioned symptoms of Covid 19 within the past 7 to 10 days?  If you have any concerns/questions about symptoms patients report during screening (either on the phone or at threshold). Contact the provider seeing the patient or DOD for further guidance.  If neither are available contact a member of the leadership team.           Contacted patient via telephone call. All no to Covid 19 questions . Has a mask.KB 

## 2018-12-26 ENCOUNTER — Other Ambulatory Visit: Payer: Medicare Other | Admitting: *Deleted

## 2018-12-26 ENCOUNTER — Other Ambulatory Visit: Payer: Self-pay

## 2018-12-26 DIAGNOSIS — I2510 Atherosclerotic heart disease of native coronary artery without angina pectoris: Secondary | ICD-10-CM

## 2018-12-26 DIAGNOSIS — E782 Mixed hyperlipidemia: Secondary | ICD-10-CM

## 2018-12-26 DIAGNOSIS — I428 Other cardiomyopathies: Secondary | ICD-10-CM | POA: Diagnosis not present

## 2018-12-26 DIAGNOSIS — I469 Cardiac arrest, cause unspecified: Secondary | ICD-10-CM

## 2018-12-26 DIAGNOSIS — I38 Endocarditis, valve unspecified: Secondary | ICD-10-CM | POA: Diagnosis not present

## 2018-12-26 DIAGNOSIS — I480 Paroxysmal atrial fibrillation: Secondary | ICD-10-CM

## 2018-12-26 DIAGNOSIS — I5022 Chronic systolic (congestive) heart failure: Secondary | ICD-10-CM

## 2018-12-26 DIAGNOSIS — I10 Essential (primary) hypertension: Secondary | ICD-10-CM

## 2018-12-26 DIAGNOSIS — E785 Hyperlipidemia, unspecified: Secondary | ICD-10-CM

## 2018-12-26 DIAGNOSIS — I4901 Ventricular fibrillation: Secondary | ICD-10-CM | POA: Diagnosis not present

## 2018-12-26 LAB — LIPID PANEL
Chol/HDL Ratio: 3.7 ratio (ref 0.0–5.0)
Cholesterol, Total: 126 mg/dL (ref 100–199)
HDL: 34 mg/dL — ABNORMAL LOW
LDL Calculated: 37 mg/dL (ref 0–99)
Triglycerides: 274 mg/dL — ABNORMAL HIGH (ref 0–149)
VLDL Cholesterol Cal: 55 mg/dL — ABNORMAL HIGH (ref 5–40)

## 2018-12-26 LAB — HEPATIC FUNCTION PANEL
ALT: 28 IU/L (ref 0–44)
AST: 16 IU/L (ref 0–40)
Albumin: 4.2 g/dL (ref 3.8–4.8)
Alkaline Phosphatase: 90 IU/L (ref 39–117)
Bilirubin Total: 0.6 mg/dL (ref 0.0–1.2)
Bilirubin, Direct: 0.18 mg/dL (ref 0.00–0.40)
Total Protein: 6.3 g/dL (ref 6.0–8.5)

## 2018-12-27 ENCOUNTER — Other Ambulatory Visit: Payer: Self-pay

## 2018-12-27 MED ORDER — VASCEPA 1 G PO CAPS: 2.0000 g | ORAL_CAPSULE | Freq: Two times a day (BID) | ORAL | 3 refills | Status: DC

## 2019-01-17 ENCOUNTER — Other Ambulatory Visit: Payer: Self-pay

## 2019-01-17 ENCOUNTER — Ambulatory Visit (INDEPENDENT_AMBULATORY_CARE_PROVIDER_SITE_OTHER): Payer: Medicare Other | Admitting: Internal Medicine

## 2019-01-17 ENCOUNTER — Encounter: Payer: Self-pay | Admitting: Internal Medicine

## 2019-01-17 ENCOUNTER — Other Ambulatory Visit (INDEPENDENT_AMBULATORY_CARE_PROVIDER_SITE_OTHER): Payer: Medicare Other

## 2019-01-17 VITALS — BP 120/82 | HR 80 | Temp 98.6°F | Ht 70.0 in | Wt 246.0 lb

## 2019-01-17 DIAGNOSIS — E785 Hyperlipidemia, unspecified: Secondary | ICD-10-CM | POA: Diagnosis not present

## 2019-01-17 DIAGNOSIS — E1169 Type 2 diabetes mellitus with other specified complication: Secondary | ICD-10-CM

## 2019-01-17 DIAGNOSIS — E118 Type 2 diabetes mellitus with unspecified complications: Secondary | ICD-10-CM

## 2019-01-17 DIAGNOSIS — I10 Essential (primary) hypertension: Secondary | ICD-10-CM

## 2019-01-17 LAB — HEMOGLOBIN A1C: Hgb A1c MFr Bld: 8 % — ABNORMAL HIGH (ref 4.6–6.5)

## 2019-01-17 MED ORDER — FENOFIBRATE 145 MG PO TABS: 145.0000 mg | ORAL_TABLET | Freq: Every day | ORAL | 3 refills | Status: DC

## 2019-01-17 NOTE — Patient Instructions (Signed)

## 2019-01-17 NOTE — Assessment & Plan Note (Signed)
Complicated by CAD and hyperlipidemia. Checking HgA1c and adjust metformin as needed. Foot exam done.

## 2019-01-17 NOTE — Progress Notes (Signed)
   Subjective:   Patient ID: Christopher [name], male    DOB: 1951/06/08, 68 y.o.   MRN: [id number]  HPI The patient is a 68 YO man coming in for follow up diabetes (taking metformin, on ACE-I and statin, denies side effects to meds, denies numbness or tingling in hands or feet) and blood pressure (denies headache or chest pains, taking metoprolol and ramipril and amlodipine, denies side effects, not exercising at all recently, diet is poor given covid) and cholesterol (cardiology helping with his triglycerides as well, they just tried to place him on vascepa but this was expensive and he refused, taking lipitor 40 mg daily and fenofibrate, denies side effects).   Review of Systems  Constitutional: Negative.   HENT: Negative.   Eyes: Negative.   Respiratory: Negative for cough, chest tightness and shortness of breath.   Cardiovascular: Negative for chest pain, palpitations and leg swelling.  Gastrointestinal: Negative for abdominal distention, abdominal pain, constipation, diarrhea, nausea and vomiting.  Musculoskeletal: Negative.   Skin: Negative.   Neurological: Negative.   Psychiatric/Behavioral: Negative.     Objective:  Physical Exam Constitutional:      Appearance: He is well-developed.  HENT:     Head: Normocephalic and atraumatic.  Neck:     Musculoskeletal: Normal range of motion.  Cardiovascular:     Rate and Rhythm: Normal rate and regular rhythm.  Pulmonary:     Effort: Pulmonary effort is normal. No respiratory distress.     Breath sounds: Normal breath sounds. No wheezing or rales.  Abdominal:     General: Bowel sounds are normal. There is no distension.     Palpations: Abdomen is soft.     Tenderness: There is no abdominal tenderness. There is no rebound.  Musculoskeletal:        General: No tenderness.  Skin:    General: Skin is warm and dry.     Comments: Foot exam done  Neurological:     Mental Status: He is alert and oriented to person, place, and time.   Coordination: Coordination normal.     Vitals:   01/17/19 1306  BP: 120/82  Pulse: 80  Temp: 98.6 F (37 C)  TempSrc: Oral  SpO2: 97%  Weight: 246 lb (111.6 kg)  Height: 5\' 10"  (1.778 m)    Assessment & Plan:

## 2019-01-17 NOTE — Assessment & Plan Note (Signed)
Seeing cardiology and they are adjusting his meds. LDL at goal on lipitor 40 mg daily. Taking fenofibrate starting today.

## 2019-01-17 NOTE — Assessment & Plan Note (Signed)
Taking metoprolol and ramipril and amlodipine. Last BMP without indication for change.

## 2019-01-23 ENCOUNTER — Ambulatory Visit (INDEPENDENT_AMBULATORY_CARE_PROVIDER_SITE_OTHER): Payer: Medicare Other

## 2019-01-23 DIAGNOSIS — I5022 Chronic systolic (congestive) heart failure: Secondary | ICD-10-CM | POA: Diagnosis not present

## 2019-01-23 DIAGNOSIS — Z95810 Presence of automatic (implantable) cardiac defibrillator: Secondary | ICD-10-CM

## 2019-01-24 NOTE — Progress Notes (Signed)
EPIC Encounter for ICM Monitoring  Patient Name: Christopher [name] is a 68 y.o. male Date: 01/24/2019 Primary Care Physican: Hoyt Koch, MD Primary Cardiologist:End Electrophysiologist:Allred LastWeight:234lbs 6/17/2020Weight: 240 lbs  Spoke with patient and is doing fine.  He has no complaints.   Optivol thoracic impedance normal.  No diuretic  Recommendations: Encouraged to call for any fluid symptoms.  Follow-up plan: ICM clinic phone appointment on8/24/2020.  Copy of ICM check sent to Dr.Allred.   3 month ICM trend: 01/23/2019    1 Year ICM trend:       Rosalene Billings, RN 01/24/2019 5:12 PM

## 2019-02-13 ENCOUNTER — Other Ambulatory Visit: Payer: Self-pay | Admitting: Internal Medicine

## 2019-02-13 DIAGNOSIS — I480 Paroxysmal atrial fibrillation: Secondary | ICD-10-CM

## 2019-02-16 ENCOUNTER — Ambulatory Visit (INDEPENDENT_AMBULATORY_CARE_PROVIDER_SITE_OTHER): Payer: Medicare Other | Admitting: *Deleted

## 2019-02-16 DIAGNOSIS — I428 Other cardiomyopathies: Secondary | ICD-10-CM

## 2019-02-16 LAB — CUP PACEART REMOTE DEVICE CHECK
Battery Remaining Longevity: 120 mo
Battery Voltage: 3.01 V
Brady Statistic RV Percent Paced: 0.94 %
Date Time Interrogation Session: 20200813052503
HighPow Impedance: 80 Ohm
Implantable Lead Implant Date: 20181102
Implantable Lead Location: 753860
Implantable Pulse Generator Implant Date: 20181102
Lead Channel Impedance Value: 513 Ohm
Lead Channel Impedance Value: 627 Ohm
Lead Channel Pacing Threshold Amplitude: 0.625 V
Lead Channel Pacing Threshold Pulse Width: 0.4 ms
Lead Channel Sensing Intrinsic Amplitude: 5.375 mV
Lead Channel Setting Pacing Amplitude: 2 V
Lead Channel Setting Pacing Pulse Width: 0.4 ms
Lead Channel Setting Sensing Sensitivity: 0.3 mV

## 2019-02-27 ENCOUNTER — Encounter: Payer: Self-pay | Admitting: Cardiology

## 2019-02-27 ENCOUNTER — Ambulatory Visit (INDEPENDENT_AMBULATORY_CARE_PROVIDER_SITE_OTHER): Payer: Medicare Other

## 2019-02-27 DIAGNOSIS — Z95810 Presence of automatic (implantable) cardiac defibrillator: Secondary | ICD-10-CM | POA: Diagnosis not present

## 2019-02-27 DIAGNOSIS — I5022 Chronic systolic (congestive) heart failure: Secondary | ICD-10-CM | POA: Diagnosis not present

## 2019-02-27 NOTE — Progress Notes (Signed)
Remote ICD transmission.   

## 2019-03-01 NOTE — Progress Notes (Signed)
EPIC Encounter for ICM Monitoring  Patient Name: Christopher [name] is a 68 y.o. male Date: 03/01/2019 Primary Care Physican: Hoyt Koch, MD Primary Cardiologist:End Electrophysiologist:Allred LastWeight:234lbs 6/17/2020Weight: 73lbs  Spoke with patient and is feeling good and has no fluid symptoms. doing fine.   Optivol thoracic impedance normal.  No diuretic  Recommendations: No changes and encouraged to call if experiencing any fluid symptoms.  Follow-up plan: ICM clinic phone appointment on10/19/2020.OV with Chanetta Marshall, NP 03/20/2019  Copy of ICM check sent to Dr.Allred.    3 month ICM trend: 02/27/2019    1 Year ICM trend:       Rosalene Billings, RN 03/01/2019 10:17 AM

## 2019-03-04 ENCOUNTER — Ambulatory Visit (INDEPENDENT_AMBULATORY_CARE_PROVIDER_SITE_OTHER): Payer: Medicare Other

## 2019-03-04 ENCOUNTER — Other Ambulatory Visit: Payer: Self-pay

## 2019-03-04 DIAGNOSIS — Z23 Encounter for immunization: Secondary | ICD-10-CM | POA: Diagnosis not present

## 2019-03-20 ENCOUNTER — Encounter: Payer: Medicare Other | Admitting: Nurse Practitioner

## 2019-03-27 ENCOUNTER — Ambulatory Visit (INDEPENDENT_AMBULATORY_CARE_PROVIDER_SITE_OTHER): Payer: Medicare Other | Admitting: Student

## 2019-03-27 ENCOUNTER — Other Ambulatory Visit: Payer: Self-pay

## 2019-03-27 VITALS — BP 136/74 | HR 67 | Ht 70.0 in | Wt 245.0 lb

## 2019-03-27 DIAGNOSIS — I2510 Atherosclerotic heart disease of native coronary artery without angina pectoris: Secondary | ICD-10-CM | POA: Diagnosis not present

## 2019-03-27 DIAGNOSIS — I10 Essential (primary) hypertension: Secondary | ICD-10-CM | POA: Diagnosis not present

## 2019-03-27 DIAGNOSIS — I5022 Chronic systolic (congestive) heart failure: Secondary | ICD-10-CM | POA: Diagnosis not present

## 2019-03-27 LAB — CUP PACEART INCLINIC DEVICE CHECK
Battery Remaining Longevity: 119 mo
Battery Voltage: 3.02 V
Brady Statistic RV Percent Paced: 0.4 %
Date Time Interrogation Session: 20200921151412
HighPow Impedance: 77 Ohm
Implantable Lead Implant Date: 20181102
Implantable Lead Location: 753860
Implantable Pulse Generator Implant Date: 20181102
Lead Channel Impedance Value: 551 Ohm
Lead Channel Impedance Value: 646 Ohm
Lead Channel Pacing Threshold Amplitude: 0.5 V
Lead Channel Pacing Threshold Pulse Width: 0.4 ms
Lead Channel Sensing Intrinsic Amplitude: 5.875 mV
Lead Channel Sensing Intrinsic Amplitude: 5.875 mV
Lead Channel Setting Pacing Amplitude: 2 V
Lead Channel Setting Pacing Pulse Width: 0.4 ms
Lead Channel Setting Sensing Sensitivity: 0.3 mV

## 2019-03-27 NOTE — Patient Instructions (Signed)
Medication Instructions:  Your physician recommends that you continue on your current medications as directed. Please refer to the Current Medication list given to you today.  If you need a refill on your cardiac medications before your next appointment, please call your pharmacy.   Lab work: .NONE ORDERED  TODAY   If you have labs (blood work) drawn today and your tests are completely normal, you will receive your results only by: . MyChart Message (if you have MyChart) OR . A paper copy in the mail If you have any lab test that is abnormal or we need to change your treatment, we will call you to review the results.  Testing/Procedures: NONE ORDERED  TODAY    Follow-Up: At [facility], you and your health needs are our priority.  As part of our continuing mission to provide you with exceptional heart care, we have created designated Provider Care Teams.  These Care Teams include your primary Cardiologist (physician) and Advanced Practice Providers (APPs -  Physician Assistants and Nurse Practitioners) who all work together to provide you with the care you need, when you need it. You will need a follow up appointment in 1 years.  Please call our office 2 months in advance to schedule this appointment.  You may see Dontai Allred, MD or one of the following Advanced Practice Providers on your designated Care Team:   Amber Seiler, NP . Renee Ursuy, PA-C  Any Other Special Instructions Will Be Listed Below (If Applicable).    

## 2019-03-27 NOTE — Progress Notes (Signed)
tin   Electrophysiology Office Note Date: 03/27/2019  ID:  [name], DOB [birth year], MRN [id number]  PCP: Hoyt Koch, MD Primary Cardiologist: Ena Dawley, MD Electrophysiologist: Thompson Grayer, MD  CC: Routine ICD follow-up  Christopher [name] is a 68 y.o. male seen today for Dr. Rayann Heman.  They present today for routine electrophysiology followup.  Since last being seen in our clinic, the patient reports doing very well. He denies chest pain, palpitations, dyspnea, PND, orthopnea, nausea, vomiting, dizziness, syncope, edema, weight gain, or early satiety.  He has not had ICD shocks.   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 Allergy   . Arthritis   . Atrial fibrillation ([name])    post op, intol of anticoag  . Cardiac arrest [facility])    a. s/p MDT single chamber ICD; 11-10-18- pt denies having a heart attack  . Chronic kidney disease    kidney stone  . Coronary artery disease    a. 2/7 Cath: LM nl, LAD min irregs, LCX min irregs, RI 40, RCA 40m, EF 55-60% basal to mid inf HK, 3-4+ MR;  b. 08/25/2012 PCI of RCA with 4.0x15 Vision BMS  . Diabetes mellitus without complication (Christopher [name])   . GERD (gastroesophageal reflux disease)    hx  . Heart murmur   . Hyperlipidemia    on statin  . Hypertension   . 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 Severe mitral regurgitation    a. Mitral valve prolapse with flail segment of posterior leaflet and severe MR by TEE, remote h/o bacterial endocarditis   . Sleep apnea    NPSG 01/21/06- AHI 40.7/hr cpap  . 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 AMPUTATION   07/28/2012   Procedure: AMPUTATION DIGIT;  Surgeon: Colin Rhein, MD;  Location: [facility];  Service: Orthopedics;  Laterality: Right;  2nd toe  . CARDIAC CATHETERIZATION    . COLONOSCOPY    . EP IMPLANTABLE DEVICE N/A 05/01/2016   Procedure: Loop Recorder Removal;  Surgeon: Thompson Grayer, MD;  Location: [place] CV LAB;  Service: Cardiovascular;  Laterality: N/A;  . FOOT SURGERY     BILATERAL TOES  . ICD IMPLANT N/A 05/07/2017    Medtronic Visia AF MRI VR SureScan implanted by Dr Curt Bears following VF arrest  . Implantable loop recorder placement  03/31/13   MDT Linq implanted by Dr Rayann Heman to evaluate for further afib  . INTRAOPERATIVE TRANSESOPHAGEAL ECHOCARDIOGRAM N/A 09/27/2012   Procedure: INTRAOPERATIVE TRANSESOPHAGEAL ECHOCARDIOGRAM;  Surgeon: [name], MD;  Location: [place];  Service: Open Heart Surgery;  Laterality: N/A;  . LEFT AND RIGHT HEART CATHETERIZATION WITH CORONARY ANGIOGRAM N/A 08/12/2012   Procedure: LEFT AND RIGHT HEART CATHETERIZATION WITH CORONARY ANGIOGRAM;  Surgeon: [name], MD;  Location: [facility] CATH LAB;  Service: Cardiovascular;  Laterality: N/A;  . LEFT HEART CATH AND CORONARY ANGIOGRAPHY N/A 05/06/2017   Procedure: LEFT HEART CATH AND CORONARY ANGIOGRAPHY;  Surgeon: [name], MD;  Location: [place] CV LAB;  Service: Cardiovascular;  Laterality: N/A;  . LOOP RECORDER IMPLANT N/A 03/31/2013   Procedure: LOOP RECORDER IMPLANT;  Surgeon: [name], MD;  Location: [place] CATH LAB;  Service: Cardiovascular;  Laterality: N/A;  . MITRAL VALVE REPAIR Right 09/27/2012   Procedure: MINIMALLY INVASIVE MITRAL VALVE REPAIR (MVR);  Surgeon: [name], MD;  Location: [place];  Service: Open Heart Surgery;  Laterality: Right;  Ultrasound guided  . PERCUTANEOUS CORONARY STENT INTERVENTION (PCI-S) N/A 08/25/2012   Procedure: PERCUTANEOUS CORONARY STENT INTERVENTION (PCI-S);  Surgeon: Sherren Mocha, MD;  Location[facility] CATH LAB;  Service: Cardiovascular;  Laterality:  N/A;  . SEPTOPLASTY     Dr. Ernesto Christopher [name]  . TEE WITHOUT CARDIOVERSION N/A 08/12/2012   Procedure: TRANSESOPHAGEAL ECHOCARDIOGRAM (TEE);  Surgeon: [name], MD;  Location: [place];  Service: Cardiovascular;  Laterality: N/A;  . TONSILLECTOMY    . UVULOPALATOPHARYNGOPLASTY      Current Outpatient Medications  Medication Sig Dispense Refill  . amLODipine (NORVASC) 5 MG tablet TAKE 1 TABLET BY MOUTH  DAILY 90 tablet 0  . aspirin EC 81 MG tablet Take 81 mg by mouth daily.    Marland Kitchen atorvastatin (LIPITOR) 40 MG tablet Take 1 tablet (40 mg total) by mouth daily. 90 tablet 3  . cetirizine (ZYRTEC) 10 MG tablet Take 10 mg by mouth daily.    . fenofibrate (TRICOR) 145 MG tablet Take 1 tablet (145 mg total) by mouth daily. 90 tablet 3  . fluticasone (FLONASE) 50 MCG/ACT nasal spray USE 2 SPRAYS IN EACH  NOSTRIL DAILY 48 g 1  . metFORMIN (GLUCOPHAGE) 500 MG tablet TAKE 1 TABLET BY MOUTH TWO  TIMES DAILY WITH A MEAL 180 tablet 1  . metoprolol succinate (TOPROL-XL) 100 MG 24 hr tablet TAKE 1 TABLET BY MOUTH  DAILY OR IMMEDIATELY  FOLLOWING A MEAL 90 tablet 1  . ramipril (ALTACE) 10 MG capsule TAKE 1 CAPSULE BY MOUTH  DAILY 90 capsule 1  . tadalafil (ADCIRCA/CIALIS) 20 MG tablet Take 0.5-1 tablets (10-20 mg total) by mouth every other day as needed for erectile dysfunction. 10 tablet 11  . tamsulosin (FLOMAX) 0.4 MG CAPS capsule TAKE 1 CAPSULE BY MOUTH  DAILY 90 capsule 1   No current facility-administered medications for this visit.     Allergies:   Codeine, Dust mite extract, and Pollen extract   Social History: Social History   Socioeconomic History  . Marital status: Divorced    Spouse name: Not on file  . Number of children: Not on file  . Years of education: Not on file  . Highest education level: Not on file  Occupational History  . Occupation: Works at [employer]  . Financial resource strain: Not on file  . Food insecurity    Worry: Not on file    Inability: Not on  file  . Transportation needs    Medical: Not on file    Non-medical: Not on file  Tobacco Use  . Smoking status: Former Smoker    Packs/day: 2.50    Years: 5.00    Pack years: 12.50    Types: Cigarettes    Quit date: 07/06/1978    Years since quitting: 40.7  . Smokeless tobacco: Never Used  . Tobacco comment: social drinker  Substance and Sexual Activity  . Alcohol use: Yes    Comment: OCC.  . Drug use: No  . Sexual activity: Yes  Lifestyle  . Physical activity    Days per week: Not on file    Minutes per session: Not on file  . Stress: Not on file  Relationships  . Social connections    Talks  on phone: Not on file    Gets together: Not on file    Attends religious service: Not on file    Active member of club or organization: Not on file    Attends meetings of clubs or organizations: Not on file    Relationship status: Not on file  . Intimate partner violence    Fear of current or ex partner: Not on file    Emotionally abused: Not on file    Physically abused: Not on file    Forced sexual activity: Not on file  Other Topics Concern  . Not on file  Social History Narrative   Works at [employer] - plans to retire 07/2015 after 36 years.   Divorced, lives alone   Supportive g-friend (shirlee moore)          Family History: Family History  Problem Relation Age of Onset  . Heart disease Father   . Colon cancer Father 71  . Diabetes Father   . Renal cancer Father   . Diabetes Mother   . Stroke Mother   . Sudden death Brother   . Heart attack Brother   . Esophageal cancer Neg Hx   . Rectal cancer Neg Hx   . Stomach cancer Neg Hx     Review of Systems: All other systems reviewed and are otherwise negative except as noted above.   Physical Exam: Vitals:   03/27/19 1150  BP: 136/74  Pulse: 67  Weight: 245 lb (111.1 kg)  Height: 5\' 10"  (1.778 m)     GEN- The patient is well appearing, alert and oriented x 3 today.   HEENT: normocephalic, atraumatic;  sclera clear, conjunctiva pink; hearing intact; oropharynx clear; neck supple, no JVP Lymph- no cervical lymphadenopathy Lungs- Clear to ausculation bilaterally, normal work of breathing.  No wheezes, rales, rhonchi Heart- Regular rate and rhythm, no murmurs, rubs or gallops, PMI not laterally displaced GI- soft, non-tender, non-distended, bowel sounds present, no hepatosplenomegaly Extremities- no clubbing, cyanosis, or edema; DP/PT/radial pulses 2+ bilaterally MS- no significant deformity or atrophy Skin- warm and dry, no rash or lesion; ICD pocket well healed Psych- euthymic mood, full affect Neuro- strength and sensation are intact  ICD interrogation- reviewed in detail today,  See PACEART report  EKG:  EKG is ordered today. The ekg ordered today shows NSR at 67 bpm, QRS 158 ms, PR 204 ms.  Recent Labs: 05/19/2018: BUN 18; Creatinine, Ser 0.97; Hemoglobin 15.0; Platelets 156.0; Potassium 3.3; Sodium 138 12/26/2018: ALT 28   Wt Readings from Last 3 Encounters:  03/27/19 245 lb (111.1 kg)  01/17/19 246 lb (111.6 kg)  11/10/18 245 lb (111.1 kg)     Other studies Reviewed: Additional studies/ records that were reviewed today include: echo 10/2017 (EF 5-55%), previous office notes, Previous labwork.  Assessment and Plan:  1.  VF arrest s/p MDT ICD  euvolemic today Stable on an appropriate medical regimen Normal ICD function See Pace Art report No changes today  2. CAD/ICM Denies ischemic symptoms Continue medical therapy Continue ICM clinic  3. HTN Stable at home and runs 120-130s at other visits. No change today.    Current medicines are reviewed at length with the patient today.   The patient does not have concerns regarding his medicines.  The following changes were made today:  none  Labs/ tests ordered today include:  Orders Placed This Encounter  Procedures  . EKG 12-Lead    Disposition:   Follow up with with EP APP  in 12 months.  Jacalyn Lefevre, PA-C  03/27/2019 12:01 PM  [address] [place] [address] [phone] (office) [phone] (fax)

## 2019-04-01 ENCOUNTER — Other Ambulatory Visit: Payer: Self-pay | Admitting: Internal Medicine

## 2019-04-03 ENCOUNTER — Other Ambulatory Visit: Payer: Self-pay | Admitting: Family

## 2019-04-03 MED ORDER — RAMIPRIL 10 MG PO CAPS: 10.0000 mg | ORAL_CAPSULE | Freq: Every day | ORAL | 0 refills | Status: DC

## 2019-04-13 DIAGNOSIS — E119 Type 2 diabetes mellitus without complications: Secondary | ICD-10-CM | POA: Diagnosis not present

## 2019-04-13 LAB — HM DIABETES EYE EXAM

## 2019-04-17 ENCOUNTER — Other Ambulatory Visit: Payer: Self-pay

## 2019-04-17 ENCOUNTER — Other Ambulatory Visit: Payer: Medicare Other | Admitting: *Deleted

## 2019-04-17 DIAGNOSIS — E785 Hyperlipidemia, unspecified: Secondary | ICD-10-CM

## 2019-04-17 LAB — LIPID PANEL
Chol/HDL Ratio: 3.8 ratio (ref 0.0–5.0)
Cholesterol, Total: 124 mg/dL (ref 100–199)
HDL: 33 mg/dL — ABNORMAL LOW (ref >39)
LDL Chol Calc (NIH): 52 mg/dL (ref 0–99)
Triglycerides: 245 mg/dL — ABNORMAL HIGH (ref 0–149)
VLDL Cholesterol Cal: 39 mg/dL (ref 5–40)

## 2019-04-17 LAB — HEPATIC FUNCTION PANEL
ALT: 31 IU/L (ref 0–44)
AST: 20 IU/L (ref 0–40)
Albumin: 4.4 g/dL (ref 3.8–4.8)
Alkaline Phosphatase: 86 IU/L (ref 39–117)
Bilirubin Total: 0.6 mg/dL (ref 0.0–1.2)
Bilirubin, Direct: 0.19 mg/dL (ref 0.00–0.40)
Total Protein: 6.8 g/dL (ref 6.0–8.5)

## 2019-04-19 ENCOUNTER — Other Ambulatory Visit: Payer: Self-pay

## 2019-04-19 DIAGNOSIS — E782 Mixed hyperlipidemia: Secondary | ICD-10-CM

## 2019-04-20 ENCOUNTER — Encounter: Payer: Self-pay | Admitting: Internal Medicine

## 2019-04-20 NOTE — Progress Notes (Signed)
Abstracted and sent to scan  

## 2019-04-24 ENCOUNTER — Ambulatory Visit (INDEPENDENT_AMBULATORY_CARE_PROVIDER_SITE_OTHER): Payer: Medicare Other

## 2019-04-24 DIAGNOSIS — Z95810 Presence of automatic (implantable) cardiac defibrillator: Secondary | ICD-10-CM | POA: Diagnosis not present

## 2019-04-24 DIAGNOSIS — I5022 Chronic systolic (congestive) heart failure: Secondary | ICD-10-CM | POA: Diagnosis not present

## 2019-04-28 NOTE — Progress Notes (Signed)
EPIC Encounter for ICM Monitoring  Patient Name: Christopher [name] is a 69 y.o. male Date: 04/28/2019 Primary Care Physican: Hoyt Koch, MD Primary Cardiologist:End Electrophysiologist:Allred 9/21/2020Weight: [id number]  Transmission reviewed.  Optivol thoracic impedance normal.  No diuretic  Recommendations: None  Follow-up plan: ICM clinic phone appointment on 06/06/2019.   91 day device clinic remote transmission 05/18/2019.    Copy of ICM check sent to Dr. Rayann Heman.   3 month ICM trend: 04/24/2019    1 Year ICM trend:       Rosalene Billings, RN 04/28/2019 10:17 AM

## 2019-05-18 ENCOUNTER — Ambulatory Visit (INDEPENDENT_AMBULATORY_CARE_PROVIDER_SITE_OTHER): Payer: Medicare Other | Admitting: *Deleted

## 2019-05-18 DIAGNOSIS — I480 Paroxysmal atrial fibrillation: Secondary | ICD-10-CM

## 2019-05-18 DIAGNOSIS — I428 Other cardiomyopathies: Secondary | ICD-10-CM | POA: Diagnosis not present

## 2019-05-18 LAB — CUP PACEART REMOTE DEVICE CHECK
Battery Remaining Longevity: 117 mo
Battery Voltage: 3.01 V
Brady Statistic RV Percent Paced: 0.21 %
Date Time Interrogation Session: 20201112051810
HighPow Impedance: 80 Ohm
Implantable Lead Implant Date: 20181102
Implantable Lead Location: 753860
Implantable Pulse Generator Implant Date: 20181102
Lead Channel Impedance Value: 513 Ohm
Lead Channel Impedance Value: 627 Ohm
Lead Channel Pacing Threshold Amplitude: 0.625 V
Lead Channel Pacing Threshold Pulse Width: 0.4 ms
Lead Channel Sensing Intrinsic Amplitude: 5.75 mV
Lead Channel Sensing Intrinsic Amplitude: 5.75 mV
Lead Channel Setting Pacing Amplitude: 2 V
Lead Channel Setting Pacing Pulse Width: 0.4 ms
Lead Channel Setting Sensing Sensitivity: 0.3 mV

## 2019-05-20 ENCOUNTER — Other Ambulatory Visit: Payer: Self-pay | Admitting: Internal Medicine

## 2019-05-20 DIAGNOSIS — I480 Paroxysmal atrial fibrillation: Secondary | ICD-10-CM

## 2019-05-23 NOTE — Progress Notes (Addendum)
Subjective:   Christopher [name] is a 68 y.o. male who presents for Medicare Annual/Subsequent preventive examination. I connected with patient by a telephone and verified that I am speaking with the correct person using two identifiers. Patient stated full name and DOB. Patient gave permission to continue with telephonic visit. Patient's location was at home and Nurse's location was at [place] office. Participants during this visit included patient and nurse.  Review of Systems:   Cardiac Risk Factors include: advanced age (>79men, >4 women);male gender;hypertension;diabetes mellitus;dyslipidemia Sleep patterns: feels rested on waking, gets up 1-2 times nightly to void and sleeps 6-8 hours nightly. Wears C-PAP  Home Safety/Smoke Alarms: Feels safe in home. Smoke alarms in place.  Living environment; residence and Firearm Safety: apartment. Lives alone, no needs for DME, limited support system Seat Belt Safety/Bike Helmet: Wears seat belt.     Objective:    Vitals: There were no vitals taken for this visit.  There is no height or weight on file to calculate BMI.  Advanced Directives 05/24/2019 05/07/2017 05/01/2016 03/31/2013 09/29/2012 09/23/2012 08/25/2012  Does Patient Have a Medical Advance Directive? No No - Patient does not have advance directive Patient does not have advance directive Patient does not have advance directive Patient does not have advance directive  Does patient want to make changes to medical advance directive? - - No - Patient declined - - - -  Would patient like information on creating a medical advance directive? Yes (ED - Information included in AVS) No - Patient declined - - - - -  Pre-existing out of facility DNR order (yellow form or pink MOST form) - - - No No - No    Tobacco Social History   Tobacco Use  Smoking Status Former Smoker  . Packs/day: 2.50  . Years: 5.00  . Pack years: 12.50  . Types: Cigarettes  . Quit date: 07/06/1978  . Years since quitting:  40.9  Smokeless Tobacco Never Used  Tobacco Comment   social drinker     Counseling given: Not Answered Comment: social drinker  Past Medical History:  Diagnosis Date  . Allergy   . Arthritis   . Atrial fibrillation ([place])    post op, intol of anticoag  . Cardiac arrest [facility])    a. s/p MDT single chamber ICD; 11-10-18- pt denies having a heart attack  . Chronic kidney disease    kidney stone  . Coronary artery disease    a. 2/7 Cath: LM nl, LAD min irregs, LCX min irregs, RI 40, RCA 33m, EF 55-60% basal to mid inf HK, 3-4+ MR;  b. 08/25/2012 PCI of RCA with 4.0x15 Vision BMS  . Diabetes mellitus without complication ([place])   . GERD (gastroesophageal reflux disease)    hx  . Heart murmur   . Hyperlipidemia    on statin  . Hypertension   . 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 Severe mitral regurgitation    a. Mitral valve prolapse with flail segment of posterior leaflet and severe MR by TEE, remote h/o bacterial endocarditis   . Sleep apnea    NPSG 01/21/06- AHI 40.7/hr cpap  . 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 AMPUTATION  07/28/2012   Procedure: AMPUTATION DIGIT;  Surgeon: Colin Rhein, MD;  Location: [facility];  Service: Orthopedics;  Laterality: Right;  2nd toe  . CARDIAC CATHETERIZATION    . COLONOSCOPY    . EP IMPLANTABLE DEVICE N/A 05/01/2016   Procedure: Loop Recorder Removal;  Surgeon: Thompson Grayer, MD;  Location: [place] CV LAB;  Service: Cardiovascular;  Laterality: N/A;  . FOOT SURGERY     BILATERAL TOES  . ICD IMPLANT N/A 05/07/2017    Medtronic Visia AF MRI VR SureScan implanted by Dr Curt Bears following VF arrest  . Implantable loop recorder placement  03/31/13   MDT Linq implanted by Dr Rayann Heman to evaluate for further afib  .  INTRAOPERATIVE TRANSESOPHAGEAL ECHOCARDIOGRAM N/A 09/27/2012   Procedure: INTRAOPERATIVE TRANSESOPHAGEAL ECHOCARDIOGRAM;  Surgeon: [name], MD;  Location: [place];  Service: Open Heart Surgery;  Laterality: N/A;  . LEFT AND RIGHT HEART CATHETERIZATION WITH CORONARY ANGIOGRAM N/A 08/12/2012   Procedure: LEFT AND RIGHT HEART CATHETERIZATION WITH CORONARY ANGIOGRAM;  Surgeon: [name], MD;  Location: [facility] CATH LAB;  Service: Cardiovascular;  Laterality: N/A;  . LEFT HEART CATH AND CORONARY ANGIOGRAPHY N/A 05/06/2017   Procedure: LEFT HEART CATH AND CORONARY ANGIOGRAPHY;  Surgeon: [name], MD;  Location: [place] CV LAB;  Service: Cardiovascular;  Laterality: N/A;  . LOOP RECORDER IMPLANT N/A 03/31/2013   Procedure: LOOP RECORDER IMPLANT;  Surgeon: [name], MD;  Location: [place] CATH LAB;  Service: Cardiovascular;  Laterality: N/A;  . MITRAL VALVE REPAIR Right 09/27/2012   Procedure: MINIMALLY INVASIVE MITRAL VALVE REPAIR (MVR);  Surgeon: [name], MD;  Location: [place];  Service: Open Heart Surgery;  Laterality: Right;  Ultrasound guided  . PERCUTANEOUS CORONARY STENT INTERVENTION (PCI-S) N/A 08/25/2012   Procedure: PERCUTANEOUS CORONARY STENT INTERVENTION (PCI-S);  Surgeon: Sherren Mocha, MD;  Location: [facility] CATH LAB;  Service: Cardiovascular;  Laterality: N/A;  . SEPTOPLASTY     Dr. [name]  . TEE WITHOUT CARDIOVERSION N/A 08/12/2012   Procedure: TRANSESOPHAGEAL ECHOCARDIOGRAM (TEE);  Surgeon: [name], MD;  Location: [place];  Service: Cardiovascular;  Laterality: N/A;  . TONSILLECTOMY    . UVULOPALATOPHARYNGOPLASTY     Family History  Problem Relation Age of Onset  . Heart disease Father   . Colon cancer Father 67  . Diabetes Father   . Renal cancer Father   . Diabetes Mother   . Stroke Mother   . Sudden death Brother   . Heart attack Brother   . Esophageal cancer Neg Hx   . Rectal cancer Neg Hx   . Stomach cancer Neg Hx    Social History    Socioeconomic History  . Marital status: Divorced    Spouse name: Not on file  . Number of children: Not on file  . Years of education: Not on file  . Highest education level: Not on file  Occupational History  . Occupation: Works at [employer] Retired  [employer]  . Financial resource strain: Not hard at all  . Food insecurity    Worry: Never true    Inability: Never true  . Transportation needs    Medical: No    Non-medical: No  Tobacco Use  . Smoking status: Former Smoker    Packs/day: 2.50    Years: 5.00    Pack years: 12.50    Types: Cigarettes    Quit date: 07/06/1978    Years since quitting: 40.9  . Smokeless tobacco: Never Used  . Tobacco comment: social drinker  Substance and Sexual Activity  . Alcohol use: Yes  Comment: OCC.  . Drug use: No  . Sexual activity: Yes  Lifestyle  . Physical activity    Days per week: 0 days    Minutes per session: 0 min  . Stress: Not at all  Relationships  . Social connections    Talks on phone: More than three times a week    Gets together: More than three times a week    Attends religious service: Not on file    Active member of club or organization: Not on file    Attends meetings of clubs or organizations: Not on file    Relationship status: Divorced  Other Topics Concern  . Not on file  Social History Narrative   Works at [employer] - plans to retire 07/2015 after 36 years.   Divorced, lives alone   Supportive g-friend (shirlee moore)          Outpatient Encounter Medications as of 05/24/2019  Medication Sig  . acetaminophen (TYLENOL) 500 MG tablet Take 500 mg by mouth every 6 (six) hours as needed.  Marland Kitchen amLODipine (NORVASC) 5 MG tablet TAKE 1 TABLET BY MOUTH  DAILY  . aspirin EC 81 MG tablet Take 81 mg by mouth daily.  Marland Kitchen atorvastatin (LIPITOR) 40 MG tablet TAKE 1 TABLET BY MOUTH  DAILY  . cetirizine (ZYRTEC) 10 MG tablet Take 10 mg by mouth daily.  . Cholecalciferol (VITAMIN D3) 250 MCG (10000 UT) TABS Take 2  tablets by mouth.  . fenofibrate (TRICOR) 145 MG tablet Take 1 tablet (145 mg total) by mouth daily.  . fluticasone (FLONASE) 50 MCG/ACT nasal spray USE 2 SPRAYS IN BOTH  NOSTRILS DAILY  . metFORMIN (GLUCOPHAGE) 500 MG tablet TAKE 1 TABLET BY MOUTH TWO  TIMES DAILY WITH A MEAL  . metoprolol succinate (TOPROL-XL) 100 MG 24 hr tablet TAKE 1 TABLET BY MOUTH  DAILY OR IMMEDIATELY  FOLLOWING A MEAL  . ramipril (ALTACE) 10 MG capsule Take 1 capsule (10 mg total) by mouth daily.  . tamsulosin (FLOMAX) 0.4 MG CAPS capsule TAKE 1 CAPSULE BY MOUTH  DAILY  . [DISCONTINUED] tadalafil (ADCIRCA/CIALIS) 20 MG tablet Take 0.5-1 tablets (10-20 mg total) by mouth every other day as needed for erectile dysfunction. (Patient not taking: Reported on 05/24/2019)   No facility-administered encounter medications on file as of 05/24/2019.     Activities of Daily Living In your present state of health, do you have any difficulty performing the following activities: 05/24/2019  Hearing? N  Vision? N  Difficulty concentrating or making decisions? N  Walking or climbing stairs? N  Dressing or bathing? N  Doing errands, shopping? N  Preparing Food and eating ? N  Using the Toilet? N  In the past six months, have you accidently leaked urine? N  Do you have problems with loss of bowel control? N  Managing your Medications? N  Managing your Finances? N  Housekeeping or managing your Housekeeping? N  Some recent data might be hidden    Patient Care Team: Hoyt Koch, MD as PCP - General (Internal Medicine) [name], MD as PCP - Cardiology (Cardiology) Thompson Grayer, MD as PCP - Electrophysiology (Cardiology) [name], MD as Attending Physician (Cardiology) [name], MD as Attending Physician (Cardiothoracic Surgery) Deneise Lever, MD as Attending Physician (Pulmonary Disease) Sherren Mocha, MD as Attending Physician (Cardiology) Juanita Craver, MD as Consulting Physician  (Gastroenterology) Thompson Grayer, MD (Cardiology)   Assessment:   This is a routine wellness examination for Christopher [name].  Physical assessment deferred to PCP.  Exercise Activities and Dietary recommendations Current Exercise Habits: The patient does not participate in regular exercise at present, Exercise limited by: None identified  Diet (meal preparation, eat out, water intake, caffeinated beverages, dairy products, fruits and vegetables): in general, a "healthy" diet     Reviewed heart healthy and diabetic diet. Encouraged patient to increase daily water and healthy fluid intake.   Goals   None     Fall Risk Fall Risk  05/24/2019 05/19/2018 05/16/2015  Falls in the past year? 1 0 No  Number falls in past yr: 1 - -  Injury with Fall? 0 - -  Risk for fall due to : Impaired balance/gait;History of fall(s) - -  Follow up Falls prevention discussed - -    Depression Screen PHQ 2/9 Scores 05/24/2019 05/19/2018 05/14/2017 05/16/2015  PHQ - 2 Score 0 0 0 0    Cognitive Function       Ad8 score reviewed for issues:  Issues making decisions: no  Less interest in hobbies / activities: no  Repeats questions, stories (family complaining): no  Trouble using ordinary gadgets (microwave, computer, phone):no  Forgets the month or year: no  Mismanaging finances: no  Remembering appts: no  Daily problems with thinking and/or memory: no Ad8 score is= 0  Immunization History  Administered Date(s) Administered  . Fluad Quad(high Dose 65+) 03/04/2019  . Influenza, High Dose Seasonal PF 04/15/2017, 04/22/2018  . Influenza,inj,Quad PF,6+ Mos 04/09/2014, 05/11/2016  . Influenza-Unspecified 04/26/2013, 05/09/2015  . Pneumococcal Conjugate-13 11/13/2014  . Pneumococcal Polysaccharide-23 05/11/2016  . Pneumococcal-Unspecified 02/24/2010  . Tdap 08/20/2010  . Zoster 08/06/2009  . Zoster Recombinat (Shingrix) 04/26/2017   Screening Tests Health Maintenance  Topic Date Due  .  HEMOGLOBIN A1C  07/20/2019  . FOOT EXAM  01/17/2020  . OPHTHALMOLOGY EXAM  04/12/2020  . TETANUS/TDAP  08/20/2020  . COLONOSCOPY  11/09/2021  . INFLUENZA VACCINE  Completed  . Hepatitis C Screening  Completed  . PNA vac Low Risk Adult  Completed       Plan:    Reviewed health maintenance screenings with patient today and relevant education, vaccines, and/or referrals were provided.   Continue to eat heart healthy diet (full of fruits, vegetables, whole grains, lean protein, water--limit salt, fat, and sugar intake) and increase physical activity as tolerated.  Continue doing brain stimulating activities (puzzles, reading, adult coloring books, staying active) to keep memory sharp.   I have personally reviewed and noted the following in the patient's chart:   . Medical and social history . Use of alcohol, tobacco or illicit drugs  . Current medications and supplements . Functional ability and status . Nutritional status . Physical activity . Advanced directives . List of other physicians . Screenings to include cognitive, depression, and falls . Referrals and appointments  In addition, I have reviewed and discussed with patient certain preventive protocols, quality metrics, and best practice recommendations. A written personalized care plan for preventive services as well as general preventive health recommendations were provided to patient.     Michiel Cowboy, RN  05/24/2019  Medical screening examination/treatment/procedure(s) were performed by non-physician practitioner and as supervising physician I was immediately available for consultation/collaboration. I agree with above. Lew Dawes, MD

## 2019-05-24 ENCOUNTER — Ambulatory Visit (INDEPENDENT_AMBULATORY_CARE_PROVIDER_SITE_OTHER): Payer: Medicare Other | Admitting: *Deleted

## 2019-05-24 DIAGNOSIS — Z0000 Encounter for general adult medical examination without abnormal findings: Secondary | ICD-10-CM | POA: Diagnosis not present

## 2019-06-06 ENCOUNTER — Ambulatory Visit (INDEPENDENT_AMBULATORY_CARE_PROVIDER_SITE_OTHER): Payer: Medicare Other

## 2019-06-06 DIAGNOSIS — Z95810 Presence of automatic (implantable) cardiac defibrillator: Secondary | ICD-10-CM | POA: Diagnosis not present

## 2019-06-06 DIAGNOSIS — I5022 Chronic systolic (congestive) heart failure: Secondary | ICD-10-CM

## 2019-06-09 NOTE — Progress Notes (Signed)
Remote ICD transmission.   

## 2019-06-09 NOTE — Progress Notes (Signed)
EPIC Encounter for ICM Monitoring  Patient Name: Christopher [name] is a 68 y.o. male Date: 06/09/2019 Primary Care Physican: Hoyt Koch, MD Primary Cardiologist:End Electrophysiologist:Allred 9/21/2020Weight: 27lbs  Spoke with patient and he asymptomatic for fluid accumulation.  Optivol thoracic impedance normal.  No diuretic  Recommendations: No changes and encouraged to call if experiencing any fluid symptoms.  Follow-up plan: ICM clinic phone appointment on 07/17/2019.   91 day device clinic remote transmission 08/17/2019.  Office visit with Dr Meda Coffee on 07/26/2019.  Copy of ICM check sent to Dr. Rayann Heman.   3 month ICM trend: 06/06/2019    1 Year ICM trend:       Rosalene Billings, RN 06/09/2019 8:27 AM

## 2019-07-17 ENCOUNTER — Ambulatory Visit: Payer: Medicare Other | Admitting: Internal Medicine

## 2019-07-17 ENCOUNTER — Ambulatory Visit (INDEPENDENT_AMBULATORY_CARE_PROVIDER_SITE_OTHER): Payer: Medicare Other

## 2019-07-17 DIAGNOSIS — I5022 Chronic systolic (congestive) heart failure: Secondary | ICD-10-CM

## 2019-07-17 DIAGNOSIS — Z95810 Presence of automatic (implantable) cardiac defibrillator: Secondary | ICD-10-CM

## 2019-07-18 ENCOUNTER — Other Ambulatory Visit: Payer: Self-pay

## 2019-07-18 ENCOUNTER — Encounter: Payer: Self-pay | Admitting: Internal Medicine

## 2019-07-18 ENCOUNTER — Ambulatory Visit (INDEPENDENT_AMBULATORY_CARE_PROVIDER_SITE_OTHER): Payer: Medicare Other | Admitting: Internal Medicine

## 2019-07-18 VITALS — BP 132/84 | HR 71 | Temp 98.5°F | Ht 70.0 in | Wt 243.0 lb

## 2019-07-18 DIAGNOSIS — I480 Paroxysmal atrial fibrillation: Secondary | ICD-10-CM | POA: Diagnosis not present

## 2019-07-18 DIAGNOSIS — E1169 Type 2 diabetes mellitus with other specified complication: Secondary | ICD-10-CM

## 2019-07-18 DIAGNOSIS — Z0000 Encounter for general adult medical examination without abnormal findings: Secondary | ICD-10-CM | POA: Diagnosis not present

## 2019-07-18 DIAGNOSIS — E785 Hyperlipidemia, unspecified: Secondary | ICD-10-CM | POA: Diagnosis not present

## 2019-07-18 DIAGNOSIS — E118 Type 2 diabetes mellitus with unspecified complications: Secondary | ICD-10-CM | POA: Diagnosis not present

## 2019-07-18 LAB — COMPREHENSIVE METABOLIC PANEL
ALT: 30 U/L (ref 0–53)
AST: 23 U/L (ref 0–37)
Albumin: 4.2 g/dL (ref 3.5–5.2)
Alkaline Phosphatase: 65 U/L (ref 39–117)
BUN: 15 mg/dL (ref 6–23)
CO2: 29 mEq/L (ref 19–32)
Calcium: 8.7 mg/dL (ref 8.4–10.5)
Chloride: 100 mEq/L (ref 96–112)
Creatinine, Ser: 1.02 mg/dL (ref 0.40–1.50)
GFR: 72.22 mL/min (ref >60.00)
Glucose, Bld: 279 mg/dL — ABNORMAL HIGH (ref 70–99)
Potassium: 3.5 mEq/L (ref 3.5–5.1)
Sodium: 137 mEq/L (ref 135–145)
Total Bilirubin: 0.6 mg/dL (ref 0.2–1.2)
Total Protein: 6.9 g/dL (ref 6.0–8.3)

## 2019-07-18 LAB — CBC
HCT: 43.2 % (ref 39.0–52.0)
Hemoglobin: 14.6 g/dL (ref 13.0–17.0)
MCHC: 33.8 g/dL (ref 30.0–36.0)
MCV: 87.7 fl (ref 78.0–100.0)
Platelets: 162 10*3/uL (ref 150.0–400.0)
RBC: 4.92 Mil/uL (ref 4.22–5.81)
RDW: 14.1 % (ref 11.5–15.5)
WBC: 6.8 10*3/uL (ref 4.0–10.5)

## 2019-07-18 LAB — LIPID PANEL
Cholesterol: 119 mg/dL (ref 0–200)
HDL: 32.6 mg/dL — ABNORMAL LOW (ref >39.00)
NonHDL: 86.61
Total CHOL/HDL Ratio: 4
Triglycerides: 366 mg/dL — ABNORMAL HIGH (ref 0.0–149.0)
VLDL: 73.2 mg/dL — ABNORMAL HIGH (ref 0.0–40.0)

## 2019-07-18 LAB — MICROALBUMIN / CREATININE URINE RATIO
Creatinine,U: 62.4 mg/dL
Microalb Creat Ratio: 3 mg/g (ref 0.0–30.0)
Microalb, Ur: 1.8 mg/dL (ref 0.0–1.9)

## 2019-07-18 LAB — HEMOGLOBIN A1C: Hgb A1c MFr Bld: 9.7 % — ABNORMAL HIGH (ref 4.6–6.5)

## 2019-07-18 MED ORDER — METOPROLOL SUCCINATE ER 100 MG PO TB24: ORAL_TABLET | ORAL | 3 refills | Status: DC

## 2019-07-18 MED ORDER — RAMIPRIL 10 MG PO CAPS: 10.0000 mg | ORAL_CAPSULE | Freq: Every day | ORAL | 3 refills | Status: DC

## 2019-07-18 MED ORDER — METFORMIN HCL 500 MG PO TABS: ORAL_TABLET | ORAL | 3 refills | Status: DC

## 2019-07-18 MED ORDER — AMLODIPINE BESYLATE 5 MG PO TABS: 5.0000 mg | ORAL_TABLET | Freq: Every day | ORAL | 3 refills | Status: DC

## 2019-07-18 MED ORDER — TAMSULOSIN HCL 0.4 MG PO CAPS: 0.4000 mg | ORAL_CAPSULE | Freq: Every day | ORAL | 3 refills | Status: DC

## 2019-07-18 MED ORDER — FENOFIBRATE 145 MG PO TABS: 145.0000 mg | ORAL_TABLET | Freq: Every day | ORAL | 3 refills | Status: DC

## 2019-07-18 MED ORDER — ATORVASTATIN CALCIUM 40 MG PO TABS: 40.0000 mg | ORAL_TABLET | Freq: Every day | ORAL | 3 refills | Status: DC

## 2019-07-18 NOTE — Assessment & Plan Note (Signed)
On rate control, aspirin 81 mg daily.

## 2019-07-18 NOTE — Progress Notes (Signed)
   Subjective:   Patient ID: Christopher [name], male    DOB: 11-23-1950, 69 y.o.   MRN: [id number]  HPI The patient is a 69 YO man coming in for physical.   PMH, [place], social history reviewed and updated  Review of Systems  Constitutional: Negative.   HENT: Negative.   Eyes: Negative.   Respiratory: Negative for cough, chest tightness and shortness of breath.   Cardiovascular: Negative for chest pain, palpitations and leg swelling.  Gastrointestinal: Negative for abdominal distention, abdominal pain, constipation, diarrhea, nausea and vomiting.  Musculoskeletal: Negative.   Skin: Negative.   Neurological: Negative.   Psychiatric/Behavioral: Negative.     Objective:  Physical Exam Constitutional:      Appearance: He is well-developed. He is obese.  HENT:     Head: Normocephalic and atraumatic.  Cardiovascular:     Rate and Rhythm: Normal rate and regular rhythm.  Pulmonary:     Effort: Pulmonary effort is normal. No respiratory distress.     Breath sounds: Normal breath sounds. No wheezing or rales.  Abdominal:     General: Bowel sounds are normal. There is no distension.     Palpations: Abdomen is soft.     Tenderness: There is no abdominal tenderness. There is no rebound.  Musculoskeletal:     Cervical back: Normal range of motion.  Skin:    General: Skin is warm and dry.  Neurological:     Mental Status: He is alert and oriented to person, place, and time.     Coordination: Coordination normal.     Vitals:   07/18/19 1431  BP: 132/84  Pulse: 71  Temp: 98.5 F (36.9 C)  TempSrc: Oral  SpO2: 95%  Weight: 243 lb (110.2 kg)  Height: 5\' 10"  (1.778 m)    This visit occurred during the SARS-CoV-2 public health emergency.  Safety protocols were in place, including screening questions prior to the visit, additional usage of staff PPE, and extensive cleaning of exam room while observing appropriate contact time as indicated for disinfecting solutions.   Assessment &  Plan:

## 2019-07-18 NOTE — Assessment & Plan Note (Signed)
Flu shot up to date. Pneumonia complete. Shingrix counseled. Tetanus due 2022. Colonoscopy due 2023. Counseled about sun safety and mole surveillance. Counseled about the dangers of distracted driving. Given 10 year screening recommendations.

## 2019-07-18 NOTE — Assessment & Plan Note (Signed)
Last HgA1c above goal. Checking HgA1c, eye exam up to date. Checking microalbumin to creatinine ratio. On ACE-I and statin. Adjust as needed.

## 2019-07-18 NOTE — Assessment & Plan Note (Signed)
Checking lipid panel as last triglycerides are not at goal. Taking lipitor and fenofibrate. May need addition of vascepa if triglycerides still high.

## 2019-07-18 NOTE — Patient Instructions (Signed)

## 2019-07-19 LAB — LDL CHOLESTEROL, DIRECT: Direct LDL: 52 mg/dL

## 2019-07-19 NOTE — Progress Notes (Signed)
EPIC Encounter for ICM Monitoring  Patient Name: Christopher [name] is a 69 y.o. male Date: 07/19/2019 Primary Care Physican: Hoyt Koch, MD Primary Cardiologist:End Electrophysiologist:Allred 01/12/2021Weight: [phone]  Spoke with patient and he is asymptomatic for fluid accumulation.  Optivol thoracic impedance normal.  No diuretic  07/18/2019 Creatinine 1.02, BUN 15, Potassium 3.5, 137, GFR 78.22  Recommendations: No changes and encouraged to call if experiencing any fluid symptoms.  Follow-up plan: ICM clinic phone appointment on2/16/2021. 91 day device clinic remote transmission 08/21/2019. Office visit with Dr Meda Coffee on 07/26/2019.  Copy of ICM check sent to Dr.Allred.   3 month ICM trend: 07/17/2019    1 Year ICM trend:       Rosalene Billings, RN 07/19/2019 9:47 AM

## 2019-07-20 ENCOUNTER — Ambulatory Visit: Payer: Medicare Other | Admitting: Internal Medicine

## 2019-07-26 ENCOUNTER — Encounter: Payer: Self-pay | Admitting: Cardiology

## 2019-07-26 ENCOUNTER — Other Ambulatory Visit: Payer: Self-pay

## 2019-07-26 ENCOUNTER — Ambulatory Visit: Payer: Medicare Other | Admitting: Cardiology

## 2019-07-26 VITALS — BP 138/84 | HR 72 | Ht 70.0 in | Wt 244.0 lb

## 2019-07-26 DIAGNOSIS — Z95810 Presence of automatic (implantable) cardiac defibrillator: Secondary | ICD-10-CM

## 2019-07-26 DIAGNOSIS — I428 Other cardiomyopathies: Secondary | ICD-10-CM | POA: Diagnosis not present

## 2019-07-26 DIAGNOSIS — I2510 Atherosclerotic heart disease of native coronary artery without angina pectoris: Secondary | ICD-10-CM

## 2019-07-26 DIAGNOSIS — I10 Essential (primary) hypertension: Secondary | ICD-10-CM | POA: Diagnosis not present

## 2019-07-26 NOTE — Patient Instructions (Signed)
Medication Instructions:   Your physician recommends that you continue on your current medications as directed. Please refer to the Current Medication list given to you today.  *If you need a refill on your cardiac medications before your next appointment, please call your pharmacy*    Follow-Up: At [facility], you and your health needs are our priority.  As part of our continuing mission to provide you with exceptional heart care, we have created designated Provider Care Teams.  These Care Teams include your primary Cardiologist (physician) and Advanced Practice Providers (APPs -  Physician Assistants and Nurse Practitioners) who all work together to provide you with the care you need, when you need it.  Your next appointment:   6 month(s)  The format for your next appointment:   Either In Person or Virtual  Provider:   Ena Dawley, MD

## 2019-07-26 NOTE — Progress Notes (Signed)
tin   Electrophysiology Office Note Date: 07/28/2019  ID:  Christopher [name], DOB 06-Mar-1951, MRN [id number]  PCP: Hoyt Koch, MD Primary Cardiologist: Ena Dawley, MD Electrophysiologist: Thompson Grayer, MD  CC: 1 year follow up   Christopher [name] is a 69 y.o. 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  The patient is a new patient to me, he wanted to switch care to [facility] office in [place].  He is a very pleasant gentleman who states that in the last year he has been feeling well.  He denies any fever or chills, no chest pain no shortness of breath other than on moderate exertion, he denies any palpitations, he has never had his ICD fire.  He has been compliant with his medication and has no side effects.  He is also being followed by Dr. Rayann Heman in EP clinic, he was last seen on March 27, 2019 when he was doing well and he is ICD was functioning properly. Device History: Medtronic Single Chamber ICD implanted 2018 for Cardiac arrest History of appropriate therapy: No History of AAD therapy: No  Past Medical History:  Diagnosis Date  . Allergy   . Arthritis   . Atrial fibrillation ([place])    post op, intol of anticoag  . Cardiac arrest [facility])    a. s/p MDT single chamber ICD; 11-10-18- pt denies having a heart attack  . Chronic kidney disease    kidney stone  . Coronary artery disease    a. 2/7 Cath: LM nl, LAD min irregs, LCX min irregs, RI 40, RCA 74m, EF 55-60% basal to mid inf HK, 3-4+ MR;  b. 08/25/2012 PCI of RCA with 4.0x15 Vision BMS  . Diabetes mellitus without complication ([place])   . GERD (gastroesophageal reflux disease)    hx  . Heart murmur   . Hyperlipidemia    on statin  . Hypertension   .  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 Severe mitral regurgitation    a. Mitral valve prolapse with flail segment of posterior leaflet and severe MR by TEE, remote h/o bacterial endocarditis   . Sleep apnea    NPSG 01/21/06- AHI 40.7/hr cpap  . 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 AMPUTATION  07/28/2012   Procedure: AMPUTATION DIGIT;  Surgeon: Colin Rhein, MD;  Location: [facility];  Service: Orthopedics;  Laterality: Right;  2nd toe  . CARDIAC CATHETERIZATION    . COLONOSCOPY    . EP IMPLANTABLE DEVICE N/A 05/01/2016   Procedure: Loop Recorder Removal;  Surgeon: Thompson Grayer, MD;  Location: [place] CV LAB;  Service: Cardiovascular;  Laterality: N/A;  . FOOT SURGERY     BILATERAL TOES  . ICD IMPLANT N/A 05/07/2017    Medtronic Visia AF MRI VR SureScan implanted by Dr Curt Bears following VF arrest  . Implantable loop recorder placement  03/31/13   MDT Linq implanted by Dr Rayann Heman to evaluate for further afib  . INTRAOPERATIVE TRANSESOPHAGEAL ECHOCARDIOGRAM N/A 09/27/2012   Procedure: INTRAOPERATIVE TRANSESOPHAGEAL ECHOCARDIOGRAM;  Surgeon: [name], MD;  Location: [place];  Service: Open Heart Surgery;  Laterality:  N/A;  . LEFT AND RIGHT HEART CATHETERIZATION WITH CORONARY ANGIOGRAM N/A 08/12/2012   Procedure: LEFT AND RIGHT HEART CATHETERIZATION WITH CORONARY ANGIOGRAM;  Surgeon: [name], MD;  [facility] CATH LAB;  Service: Cardiovascular;  Laterality: N/A;  . LEFT HEART CATH AND CORONARY ANGIOGRAPHY N/A 05/06/2017   Procedure: LEFT HEART CATH AND CORONARY ANGIOGRAPHY;  Surgeon: [name], MD;  Location: [place] CV LAB;  Service: Cardiovascular;  Laterality: N/A;  . LOOP RECORDER IMPLANT N/A 03/31/2013   Procedure: LOOP RECORDER IMPLANT;   Surgeon: [name], MD;  Location: [place] CATH LAB;  Service: Cardiovascular;  Laterality: N/A;  . MITRAL VALVE REPAIR Right 09/27/2012   Procedure: MINIMALLY INVASIVE MITRAL VALVE REPAIR (MVR);  Surgeon: [name], MD;  Location: [place];  Service: Open Heart Surgery;  Laterality: Right;  Ultrasound guided  . PERCUTANEOUS CORONARY STENT INTERVENTION (PCI-S) N/A 08/25/2012   Procedure: PERCUTANEOUS CORONARY STENT INTERVENTION (PCI-S);  Surgeon: Sherren Mocha, MD;  Location: [facility] CATH LAB;  Service: Cardiovascular;  Laterality: N/A;  . SEPTOPLASTY     Dr. [name]  . TEE WITHOUT CARDIOVERSION N/A 08/12/2012   Procedure: TRANSESOPHAGEAL ECHOCARDIOGRAM (TEE);  Surgeon: [name], MD;  Location: [place];  Service: Cardiovascular;  Laterality: N/A;  . TONSILLECTOMY    . UVULOPALATOPHARYNGOPLASTY     Current Outpatient Medications  Medication Sig Dispense Refill  . acetaminophen (TYLENOL) 500 MG tablet Take 500 mg by mouth every 6 (six) hours as needed.    Marland Kitchen amLODipine (NORVASC) 5 MG tablet Take 1 tablet (5 mg total) by mouth daily. 90 tablet 3  . aspirin EC 81 MG tablet Take 81 mg by mouth daily.    Marland Kitchen atorvastatin (LIPITOR) 40 MG tablet Take 1 tablet (40 mg total) by mouth daily. 90 tablet 3  . cetirizine (ZYRTEC) 10 MG tablet Take 10 mg by mouth daily.    . Cholecalciferol (VITAMIN D3) 250 MCG (10000 UT) TABS Take 2 tablets by mouth.    . fenofibrate (TRICOR) 145 MG tablet Take 1 tablet (145 mg total) by mouth daily. 90 tablet 3  . fluticasone (FLONASE) 50 MCG/ACT nasal spray USE 2 SPRAYS IN BOTH  NOSTRILS DAILY 48 g 1  . metFORMIN (GLUCOPHAGE) 500 MG tablet TAKE 1 TABLET BY MOUTH TWO  TIMES DAILY WITH A MEAL 180 tablet 3  . metoprolol succinate (TOPROL-XL) 100 MG 24 hr tablet TAKE 1 TABLET BY MOUTH  DAILY OR IMMEDIATELY  FOLLOWING A MEAL 90 tablet 3  . ramipril (ALTACE) 10 MG capsule Take 1 capsule (10 mg total) by mouth daily. 90 capsule 3  . tamsulosin (FLOMAX) 0.4 MG CAPS capsule  Take 1 capsule (0.4 mg total) by mouth daily. 90 capsule 3   No current facility-administered medications for this visit.   Allergies:   Codeine, Dust mite extract, and Pollen extract   Social History: Social History   Socioeconomic History  . Marital status: Divorced    Spouse name: Not on file  . Number of children: Not on file  . Years of education: Not on file  . Highest education level: Not on file  Occupational History  . Occupation: Works at [employer] Retired  Tobacco Use  . Smoking status: Former Smoker    Packs/day: 2.50    Years: 5.00    Pack years: 12.50    Types: Cigarettes    Quit date: 07/06/1978    Years since quitting: 41.0  . Smokeless tobacco: Never Used  . Tobacco comment: social  drinker  Substance and Sexual Activity  . Alcohol use: Yes    Comment: OCC.  . Drug use: No  . Sexual activity: Yes  Other Topics Concern  . Not on file  Social History Narrative   Works at [employer] - plans to retire 07/2015 after 36 years.   Divorced, lives alone   Supportive g-friend (shirlee moore)         Social Determinants of Health   Financial Resource Strain: Low Risk   . Difficulty of Paying Living Expenses: Not hard at all  Food Insecurity: No Food Insecurity  . Worried About Charity fundraiser in the Last Year: Never true  . Ran Out of Food in the Last Year: Never true  Transportation Needs: No Transportation Needs  . Lack of Transportation (Medical): No  . Lack of Transportation (Non-Medical): No  Physical Activity: Inactive  . Days of Exercise per Week: 0 days  . Minutes of Exercise per Session: 0 min  Stress: No Stress Concern Present  . Feeling of Stress : Not at all  Social Connections: Unknown  . Frequency of Communication with Friends and Family: More than three times a week  . Frequency of Social Gatherings with Friends and Family: More than three times a week  . Attends Religious Services: Not on file  . Active Member of Clubs or Organizations:  Not on file  . Attends Archivist Meetings: Not on file  . Marital Status: Divorced  Human resources officer Violence:   . Fear of Current or Ex-Partner: Not on file  . Emotionally Abused: Not on file  . Physically Abused: Not on file  . Sexually Abused: Not on file   Family History: Family History  Problem Relation Age of Onset  . Heart disease Father   . Colon cancer Father 84  . Diabetes Father   . Renal cancer Father   . Diabetes Mother   . Stroke Mother   . Sudden death Brother   . Heart attack Brother   . Esophageal cancer Neg Hx   . Rectal cancer Neg Hx   . Stomach cancer Neg Hx    Review of Systems: All other systems reviewed and are otherwise negative except as noted above.  Physical Exam: Vitals:   07/26/19 1544  BP: 138/84  Pulse: 72  SpO2: 96%  Weight: 244 lb (110.7 kg)  Height: 5\' 10"  (1.778 m)    GEN- The patient is well appearing, alert and oriented x 3 today.   HEENT: normocephalic, atraumatic; sclera clear, conjunctiva pink; hearing intact; oropharynx clear; neck supple, no JVP Lymph- no cervical lymphadenopathy Lungs- Clear to ausculation bilaterally, normal work of breathing.  No wheezes, rales, rhonchi Heart- Regular rate and rhythm, no murmurs, rubs or gallops, PMI not laterally displaced GI- soft, non-tender, non-distended, bowel sounds present, no hepatosplenomegaly Extremities- no clubbing, cyanosis, or edema; DP/PT/radial pulses 2+ bilaterally MS- no significant deformity or atrophy Skin- warm and dry, no rash or lesion; ICD pocket well healed Psych- euthymic mood, full affect Neuro- strength and sensation are intact  ICD interrogation- reviewed in detail today,  See PACEART report  EKG:  EKG is ordered today. The ekg ordered today shows normal sinus rhythm with right bundle branch block, unchanged from prior, QT interval 466 ms, this was personally reviewed.  TTE: 10/04/2017  - Left ventricle: Inferoseptal hypokinesis. Compared to echo  from   Nov 2018, LVEF is mildly improved. The cavity size was severely   dilated. Wall thickness was  increased in a pattern of mild LVH.   Systolic function was normal. The estimated ejection fraction was   in the range of 50% to 55%. - Aortic valve: There was trivial regurgitation. - Mitral valve: s/p MV annuloplasty. Calcified annulus. Mildly   thickened leaflets . Valve area by pressure half-time: 2.06 cm^2.   Valve area by continuity equation (using LVOT flow): 1.42 cm^2.  Recent Labs: 07/18/2019: ALT 30; BUN 15; Creatinine, Ser 1.02; Hemoglobin 14.6; Platelets 162.0; Potassium 3.5; Sodium 137   Wt Readings from Last 3 Encounters:  07/26/19 244 lb (110.7 kg)  07/18/19 243 lb (110.2 kg)  03/27/19 245 lb (111.1 kg)      Assessment and Plan:  1.  VF arrest s/p MDT ICD  Patient seems to be functional class IIa, he does not appear to be fluid overloaded, he has normal ICD function per last interrogation and no ICD firing since then no palpitations.    2. CAD/ICM The patient is asymptomatic he is most recent LVEF is 50 to 55%, will continue same management with ramipril and metoprolol as well as aspirin fenofibrate atorvastatin that he tolerates well.  3. HTN Stable at home and runs 120-130s, I will continue ramipril, metoprolol, amlodipine.  4. CAD No angina or shortness of breath.  Continue current medications for secondary prevention, as well as regular exercise.  5. Valvular heart disease (endocarditis status post mitral valve repair) No signs or symptoms of heart failure.  No murmurs on exam today.  Continue low-dose aspirin and SBE prophylaxis for dental procedures.  6. Post-operative atrial fibrillation No recurrence based on symptoms. EKG today shows NSR.  No further intervention at this time.  7. Hyperlipidemia LDL at goal.  Continue atorvastatin 20 mg daily and fenofibrate 145 mg daily.  Current medicines are reviewed at length with the patient today.   The  patient does not have concerns regarding his medicines.  The following changes were made today:  none  Labs/ tests ordered today include:  Orders Placed This Encounter  Procedures  . EKG 12-Lead   Disposition:     Signed, Ena Dawley, MD  07/28/2019 12:50 PM  [place] [address] [phone] (office) [phone] (fax)

## 2019-08-18 ENCOUNTER — Ambulatory Visit: Payer: Medicare Other | Attending: Internal Medicine

## 2019-08-18 DIAGNOSIS — Z23 Encounter for immunization: Secondary | ICD-10-CM | POA: Insufficient documentation

## 2019-08-18 NOTE — Progress Notes (Signed)
   Covid-19 Vaccination Clinic  Name:  Christopher [name]    MRN: [id number] DOB: [birth year]  08/18/2019  [name] was observed post Covid-19 immunization for 15 minutes without incidence. He was provided with Vaccine Information Sheet and instruction to access the V-Safe system.   [name] was instructed to call 911 with any severe reactions post vaccine: Marland Kitchen Difficulty breathing  . Swelling of your face and throat  . A fast heartbeat  . A bad rash all over your body  . Dizziness and weakness    Immunizations Administered    Name Date Dose VIS Date Route   Pfizer COVID-19 Vaccine 08/18/2019  5:08 PM 0.3 mL 06/16/2019 Intramuscular   Manufacturer: [place]   Lot: [id number]   [place]: [id number]

## 2019-08-19 LAB — CUP PACEART REMOTE DEVICE CHECK
Battery Remaining Longevity: 114 mo
Battery Voltage: 3 V
Brady Statistic RV Percent Paced: 0.13 %
Date Time Interrogation Session: 20210211232605
HighPow Impedance: 81 Ohm
Implantable Lead Implant Date: 20181102
Implantable Lead Location: 753860
Implantable Pulse Generator Implant Date: 20181102
Lead Channel Impedance Value: 513 Ohm
Lead Channel Impedance Value: 627 Ohm
Lead Channel Pacing Threshold Amplitude: 0.5 V
Lead Channel Pacing Threshold Pulse Width: 0.4 ms
Lead Channel Sensing Intrinsic Amplitude: 5.125 mV
Lead Channel Sensing Intrinsic Amplitude: 5.125 mV
Lead Channel Setting Pacing Amplitude: 2 V
Lead Channel Setting Pacing Pulse Width: 0.4 ms
Lead Channel Setting Sensing Sensitivity: 0.3 mV

## 2019-08-21 ENCOUNTER — Ambulatory Visit (INDEPENDENT_AMBULATORY_CARE_PROVIDER_SITE_OTHER): Payer: Medicare Other | Admitting: *Deleted

## 2019-08-21 DIAGNOSIS — I428 Other cardiomyopathies: Secondary | ICD-10-CM

## 2019-08-22 ENCOUNTER — Ambulatory Visit (INDEPENDENT_AMBULATORY_CARE_PROVIDER_SITE_OTHER): Payer: Medicare Other

## 2019-08-22 DIAGNOSIS — I5022 Chronic systolic (congestive) heart failure: Secondary | ICD-10-CM | POA: Diagnosis not present

## 2019-08-22 DIAGNOSIS — Z95810 Presence of automatic (implantable) cardiac defibrillator: Secondary | ICD-10-CM | POA: Diagnosis not present

## 2019-08-22 NOTE — Progress Notes (Signed)
ICD Remote  

## 2019-08-29 NOTE — Progress Notes (Signed)
EPIC Encounter for ICM Monitoring  Patient Name: Christopher [name] is a 69 y.o. male Date: 08/29/2019 Primary Care Physican: Hoyt Koch, MD Primary Cardiologist:End Electrophysiologist:Allred 01/12/2021Weight: [id number]  Transmission reviewed.  Optivol thoracic impedance normal.  No diuretic  07/18/2019 Creatinine 1.02, BUN 15, Potassium 3.5, 137, GFR 78.22  Recommendations: No changes and encouraged to call if experiencing any fluid symptoms.  Follow-up plan: ICM clinic phone appointment on3/22/2021. 91 day device clinic remote transmission5/17/2021.  Copy of ICM check sent to Dr.Allred.  3 month ICM trend: 08/21/2019    1 Year ICM trend:       Rosalene Billings, RN 08/29/2019 8:30 AM

## 2019-09-05 ENCOUNTER — Other Ambulatory Visit: Payer: Self-pay | Admitting: Internal Medicine

## 2019-09-10 ENCOUNTER — Ambulatory Visit: Payer: Medicare Other | Attending: Internal Medicine

## 2019-09-10 DIAGNOSIS — Z23 Encounter for immunization: Secondary | ICD-10-CM | POA: Insufficient documentation

## 2019-09-10 NOTE — Progress Notes (Signed)
   Covid-19 Vaccination Clinic  Name:  Christopher [name]    MRN: [id number] DOB: April 21, 1951  09/10/2019  [name] was observed post Covid-19 immunization for 15 minutes without incident. He was provided with Vaccine Information Sheet and instruction to access the V-Safe system.   [name] was instructed to call 911 with any severe reactions post vaccine: Marland Kitchen Difficulty breathing  . Swelling of face and throat  . A fast heartbeat  . A bad rash all over body  . Dizziness and weakness   Immunizations Administered    Name Date Dose VIS Date Route   Pfizer COVID-19 Vaccine 09/10/2019  1:04 PM 0.3 mL 06/16/2019 Intramuscular   Manufacturer: [place]   Lot: [id number]   [place]: [id number]

## 2019-09-25 ENCOUNTER — Ambulatory Visit (INDEPENDENT_AMBULATORY_CARE_PROVIDER_SITE_OTHER): Payer: Medicare Other

## 2019-09-25 DIAGNOSIS — I5022 Chronic systolic (congestive) heart failure: Secondary | ICD-10-CM

## 2019-09-25 DIAGNOSIS — Z95810 Presence of automatic (implantable) cardiac defibrillator: Secondary | ICD-10-CM | POA: Diagnosis not present

## 2019-09-29 NOTE — Progress Notes (Signed)
EPIC Encounter for ICM Monitoring  Patient Name: Christopher [name] is a 69 y.o. male Date: 09/29/2019 Primary Care Physican: Hoyt Koch, MD Primary Cardiologist:End Electrophysiologist:Allred 01/12/2021Weight: [phone]  Spoke with patient and reports feeling well at this time.  Denies fluid symptoms.  Completed both COVID vaccine shots.   Optivol thoracic impedance normal.  No diuretic  07/18/2019 Creatinine 1.02, BUN 15, Potassium 3.5, 137, GFR 78.22  Recommendations: No changes and encouraged to call if experiencing any fluid symptoms.  Follow-up plan: ICM clinic phone appointment on4/27/2021. 91 day device clinic remote transmission5/17/2021.  Copy of ICM check sent to Dr.Allred.  3 month ICM trend: 09/22/2019    1 Year ICM trend:       Rosalene Billings, RN 09/29/2019 12:04 PM

## 2019-10-16 ENCOUNTER — Other Ambulatory Visit: Payer: Medicare Other

## 2019-10-31 ENCOUNTER — Ambulatory Visit (INDEPENDENT_AMBULATORY_CARE_PROVIDER_SITE_OTHER): Payer: Medicare Other

## 2019-10-31 DIAGNOSIS — Z95810 Presence of automatic (implantable) cardiac defibrillator: Secondary | ICD-10-CM

## 2019-10-31 DIAGNOSIS — I5022 Chronic systolic (congestive) heart failure: Secondary | ICD-10-CM

## 2019-11-03 NOTE — Progress Notes (Signed)
EPIC Encounter for ICM Monitoring  Patient Name: Christopher [name] is a 69 y.o. male Date: 11/03/2019 Primary Care Physican: Hoyt Koch, MD Primary Cardiologist:End Electrophysiologist:Allred 4/30/2021Weight: 73lbs  Spoke with patient and reports feeling well at this time.  Denies fluid symptoms.    Optivol thoracic impedance normal.  No diuretic  07/18/2019 Creatinine 1.02, BUN 15, Potassium 3.5, 137, GFR 78.22  Recommendations: No changes and encouraged to call if experiencing any fluid symptoms.  Follow-up plan: ICM clinic phone appointment on6/08/2019. 91 day device clinic remote transmission5/17/2021.  Copy of ICM check sent to Dr.Allred.  3 month ICM trend: 10/31/2019    1 Year ICM trend:       Rosalene Billings, RN 11/03/2019 1:07 PM

## 2019-11-07 DIAGNOSIS — I4901 Ventricular fibrillation: Secondary | ICD-10-CM

## 2019-11-07 HISTORY — DX: Ventricular fibrillation: I49.01

## 2019-11-08 ENCOUNTER — Telehealth: Payer: Self-pay

## 2019-11-08 NOTE — Telephone Encounter (Signed)
[facility] alert received- Pt had treated VF episode 11/07/19 1626.  Device placed after Syncopal episode with VF arrest.  Pt has not had any other treated episodes since ICD placement.  Current meds include Metoprolol Succinate 100mg  Daily.    Spoke with pt, he was not aware of any specific symptoms yesterday.  At first he indicated that the report must be related to power outage in his home at time of episode.  Explained that a power outage in home cannot cause the arrhythmia seen.  He does report that he had intermittent episodes of dizziness yesterday but cannot report on timing of episodes.  Today pt denies any symptoms and confirms compliance with meds.    Educated pt on DM regulations regarding no driving for 6 months following treated episode.   No upcoming appts scheduled, pt not due for in person check until 03/2020.  Forwarding to MD for review.

## 2019-11-19 NOTE — Telephone Encounter (Signed)
Please add on to my schedule next available.   Thompson Grayer MD, [facility] 11/19/2019 1:12 PM

## 2019-11-20 ENCOUNTER — Ambulatory Visit (INDEPENDENT_AMBULATORY_CARE_PROVIDER_SITE_OTHER): Payer: Medicare Other | Admitting: *Deleted

## 2019-11-20 DIAGNOSIS — I480 Paroxysmal atrial fibrillation: Secondary | ICD-10-CM

## 2019-11-20 NOTE — Telephone Encounter (Signed)
Spoke with pt, advised of MD request for him to be seen in office next avail.  Will forward to scheduling.

## 2019-11-21 LAB — CUP PACEART REMOTE DEVICE CHECK
Battery Remaining Longevity: 108 mo
Battery Voltage: 3.01 V
Brady Statistic RV Percent Paced: 0.17 %
Date Time Interrogation Session: 20210518052824
HighPow Impedance: 80 Ohm
Implantable Lead Implant Date: 20181102
Implantable Lead Location: 753860
Implantable Pulse Generator Implant Date: 20181102
Lead Channel Impedance Value: 513 Ohm
Lead Channel Impedance Value: 646 Ohm
Lead Channel Pacing Threshold Amplitude: 0.5 V
Lead Channel Pacing Threshold Pulse Width: 0.4 ms
Lead Channel Sensing Intrinsic Amplitude: 5.375 mV
Lead Channel Sensing Intrinsic Amplitude: 5.375 mV
Lead Channel Setting Pacing Amplitude: 2 V
Lead Channel Setting Pacing Pulse Width: 0.4 ms
Lead Channel Setting Sensing Sensitivity: 0.3 mV

## 2019-11-22 NOTE — Progress Notes (Signed)
Remote ICD transmission.   

## 2019-12-06 ENCOUNTER — Ambulatory Visit (INDEPENDENT_AMBULATORY_CARE_PROVIDER_SITE_OTHER): Payer: Medicare Other

## 2019-12-06 DIAGNOSIS — I5022 Chronic systolic (congestive) heart failure: Secondary | ICD-10-CM | POA: Diagnosis not present

## 2019-12-06 DIAGNOSIS — Z95810 Presence of automatic (implantable) cardiac defibrillator: Secondary | ICD-10-CM

## 2019-12-08 ENCOUNTER — Telehealth: Payer: Self-pay

## 2019-12-08 NOTE — Telephone Encounter (Signed)
Remote ICM transmission received.  Attempted call to patient regarding ICM remote transmission and left detailed message per DPR.  Advised to return call for any fluid symptoms or questions. Next ICM remote transmission scheduled 01/15/2020.     

## 2019-12-08 NOTE — Progress Notes (Signed)
EPIC Encounter for ICM Monitoring  Patient Name: Christopher [name] is a 69 y.o. male Date: 12/08/2019 Primary Care Physican: Hoyt Koch, MD Primary Cardiologist:End Electrophysiologist:Allred 4/30/2021Weight: 240lbs  Attempted call to patient and unable to reach.  Left detailed message per DPR regarding transmission. Transmission reviewed.   Optivol thoracic impedance normal.  No diuretic  07/18/2019 Creatinine 1.02, BUN 15, Potassium 3.5, 137, GFR 78.22  Recommendations: Left voice mail with ICM number and encouraged to call if experiencing any fluid symptoms.  Follow-up plan: ICM clinic phone appointment on7/06/2020. 91 day device clinic remote transmission8/16/2021.  Office visit with Dr Rayann Heman on 01/03/2020.  Copy of ICM check sent to Dr.Allred.  3 month ICM trend: 12/06/2019    1 Year ICM trend:       Rosalene Billings, RN 12/08/2019 1:24 PM

## 2020-01-03 ENCOUNTER — Other Ambulatory Visit: Payer: Self-pay

## 2020-01-03 ENCOUNTER — Ambulatory Visit: Payer: Medicare Other | Admitting: Internal Medicine

## 2020-01-03 ENCOUNTER — Encounter: Payer: Self-pay | Admitting: Internal Medicine

## 2020-01-03 VITALS — BP 144/78 | HR 75 | Ht 70.0 in | Wt 227.0 lb

## 2020-01-03 DIAGNOSIS — I480 Paroxysmal atrial fibrillation: Secondary | ICD-10-CM

## 2020-01-03 DIAGNOSIS — I472 Ventricular tachycardia, unspecified: Secondary | ICD-10-CM

## 2020-01-03 DIAGNOSIS — I469 Cardiac arrest, cause unspecified: Secondary | ICD-10-CM | POA: Diagnosis not present

## 2020-01-03 DIAGNOSIS — E782 Mixed hyperlipidemia: Secondary | ICD-10-CM | POA: Diagnosis not present

## 2020-01-03 DIAGNOSIS — I5022 Chronic systolic (congestive) heart failure: Secondary | ICD-10-CM | POA: Diagnosis not present

## 2020-01-03 DIAGNOSIS — I4901 Ventricular fibrillation: Secondary | ICD-10-CM | POA: Diagnosis not present

## 2020-01-03 DIAGNOSIS — I519 Heart disease, unspecified: Secondary | ICD-10-CM

## 2020-01-03 LAB — CUP PACEART INCLINIC DEVICE CHECK
Battery Remaining Longevity: 107 mo
Battery Voltage: 3.01 V
Brady Statistic RV Percent Paced: 0.14 %
Date Time Interrogation Session: 20210630124900
HighPow Impedance: 79 Ohm
Implantable Lead Implant Date: 20181102
Implantable Lead Location: 753860
Implantable Pulse Generator Implant Date: 20181102
Lead Channel Impedance Value: 494 Ohm
Lead Channel Impedance Value: 589 Ohm
Lead Channel Pacing Threshold Amplitude: 0.5 V
Lead Channel Pacing Threshold Pulse Width: 0.4 ms
Lead Channel Sensing Intrinsic Amplitude: 6 mV
Lead Channel Setting Pacing Amplitude: 2 V
Lead Channel Setting Pacing Pulse Width: 0.4 ms
Lead Channel Setting Sensing Sensitivity: 0.3 mV

## 2020-01-03 NOTE — Progress Notes (Signed)
PCP: Hoyt Koch, MD Primary Cardiologist: Dr Meda Coffee Primary EP: Dr Thompson Grayer Christopher [name] is a 69 y.o. male who presents today for routine electrophysiology followup.  Since last being seen in our clinic, the patient reports doing very well.  He had VF 11/07/19 while sitting on his couch.  Appropriate ICD shocks therapy was delivered.  he had syncope but does not recall. Today, he denies symptoms of palpitations, chest pain, shortness of breath,  lower extremity edema, dizziness, presyncope, syncope, or ICD shocks.  The patient is otherwise without complaint today.   Past Medical History:  Diagnosis Date  . Allergy   . Arthritis   . Atrial fibrillation ([place])    post op, intol of anticoag  . Cardiac arrest [facility])    a. s/p MDT single chamber ICD; 11-10-18- pt denies having a heart attack  . Chronic kidney disease    kidney stone  . Coronary artery disease    a. 2/7 Cath: LM nl, LAD min irregs, LCX min irregs, RI 40, RCA 77m, EF 55-60% basal to mid inf HK, 3-4+ MR;  b. 08/25/2012 PCI of RCA with 4.0x15 Vision BMS  . Diabetes mellitus without complication ([place])   . GERD (gastroesophageal reflux disease)    hx  . Heart murmur   . Hyperlipidemia    on statin  . Hypertension   . 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 Severe mitral regurgitation    a. Mitral valve prolapse with flail segment of posterior leaflet and severe MR by TEE, remote h/o bacterial endocarditis   . Sleep apnea    NPSG 01/21/06- AHI 40.7/hr cpap  . 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 AMPUTATION  07/28/2012   Procedure: AMPUTATION DIGIT;  Surgeon: Colin Rhein, MD;  Location: [facility];  Service: Orthopedics;  Laterality: Right;  2nd toe  . CARDIAC CATHETERIZATION    .  COLONOSCOPY    . EP IMPLANTABLE DEVICE N/A 05/01/2016   Procedure: Loop Recorder Removal;  Surgeon: Thompson Grayer, MD;  Location: [place] CV LAB;  Service: Cardiovascular;  Laterality: N/A;  . FOOT SURGERY     BILATERAL TOES  . ICD IMPLANT N/A 05/07/2017    Medtronic Visia AF MRI VR SureScan implanted by Dr Curt Bears following VF arrest  . Implantable loop recorder placement  03/31/13   MDT Linq implanted by Dr Rayann Heman to evaluate for further afib  . INTRAOPERATIVE TRANSESOPHAGEAL ECHOCARDIOGRAM N/A 09/27/2012   Procedure: INTRAOPERATIVE TRANSESOPHAGEAL ECHOCARDIOGRAM;  Surgeon: [name], MD;  Location: [place];  Service: Open Heart Surgery;  Laterality: N/A;  . LEFT AND RIGHT HEART CATHETERIZATION WITH CORONARY ANGIOGRAM N/A 08/12/2012   Procedure: LEFT AND RIGHT HEART CATHETERIZATION WITH CORONARY ANGIOGRAM;  Surgeon: [name], MD;  Location: [facility] CATH LAB;  Service: Cardiovascular;  Laterality: N/A;  . LEFT HEART CATH AND CORONARY ANGIOGRAPHY N/A 05/06/2017   Procedure: LEFT HEART CATH AND CORONARY ANGIOGRAPHY;  Surgeon: [name], MD;  Location: [place] CV LAB;  Service: Cardiovascular;  Laterality: N/A;  . LOOP RECORDER IMPLANT N/A 03/31/2013   Procedure: LOOP RECORDER IMPLANT;  Surgeon: [name], MD;  Location: [place] CATH LAB;  Service: Cardiovascular;  Laterality: N/A;  . MITRAL VALVE REPAIR Right 09/27/2012   Procedure: MINIMALLY INVASIVE MITRAL  VALVE REPAIR (MVR);  Surgeon: [name], MD;  Location: [place];  Service: Open Heart Surgery;  Laterality: Right;  Ultrasound guided  . PERCUTANEOUS CORONARY STENT INTERVENTION (PCI-S) N/A 08/25/2012   Procedure: PERCUTANEOUS CORONARY STENT INTERVENTION (PCI-S);  Surgeon: Sherren Mocha, MD;  Location: [facility] CATH LAB;  Service: Cardiovascular;  Laterality: N/A;  . SEPTOPLASTY     Dr. [name]  . TEE WITHOUT CARDIOVERSION N/A 08/12/2012   Procedure: TRANSESOPHAGEAL ECHOCARDIOGRAM (TEE);  Surgeon: [name], MD;  Location:  [place];  Service: Cardiovascular;  Laterality: N/A;  . TONSILLECTOMY    . UVULOPALATOPHARYNGOPLASTY      ROS- all systems are reviewed and negative except as per HPI above  Current Outpatient Medications  Medication Sig Dispense Refill  . acetaminophen (TYLENOL) 500 MG tablet Take 500 mg by mouth every 6 (six) hours as needed.    Marland Kitchen amLODipine (NORVASC) 5 MG tablet Take 1 tablet (5 mg total) by mouth daily. 90 tablet 3  . aspirin EC 81 MG tablet Take 81 mg by mouth daily.    Marland Kitchen atorvastatin (LIPITOR) 40 MG tablet Take 1 tablet (40 mg total) by mouth daily. 90 tablet 3  . cetirizine (ZYRTEC) 10 MG tablet Take 10 mg by mouth daily.    . Cholecalciferol (VITAMIN D3) 250 MCG (10000 UT) TABS Take 2 tablets by mouth.    . fenofibrate (TRICOR) 145 MG tablet Take 1 tablet (145 mg total) by mouth daily. 90 tablet 3  . fluticasone (FLONASE) 50 MCG/ACT nasal spray USE 2 SPRAYS IN BOTH  NOSTRILS DAILY 48 g 1  . metFORMIN (GLUCOPHAGE) 500 MG tablet TAKE 1 TABLET BY MOUTH TWO  TIMES DAILY WITH A MEAL 180 tablet 3  . metoprolol succinate (TOPROL-XL) 100 MG 24 hr tablet TAKE 1 TABLET BY MOUTH  DAILY OR IMMEDIATELY  FOLLOWING A MEAL 90 tablet 3  . ramipril (ALTACE) 10 MG capsule Take 1 capsule (10 mg total) by mouth daily. 90 capsule 3  . tamsulosin (FLOMAX) 0.4 MG CAPS capsule Take 1 capsule (0.4 mg total) by mouth daily. 90 capsule 3   No current facility-administered medications for this visit.    Physical Exam: Vitals:   01/03/20 1217  BP: (!) 144/78  Pulse: 75  SpO2: 93%  Weight: 227 lb (103 kg)  Height: 5\' 10"  (1.778 m)    GEN- The patient is well appearing, alert and oriented x 3 today.   Head- normocephalic, atraumatic Eyes-  Sclera clear, conjunctiva pink Ears- hearing intact Oropharynx- clear Lungs- Clear to ausculation bilaterally, normal work of breathing Chest- ICD pocket is well healed Heart- Regular rate and rhythm, no murmurs, rubs or gallops, PMI not laterally  displaced GI- soft, NT, ND, + BS Extremities- no clubbing, cyanosis, or edema  ICD interrogation- reviewed in detail today,  See PACEART report  ekg tracing ordered today is personally reviewed and shows sinus with RBBB, PR 196 msec, QRS 154 msec, Qtc 511 msec, inferior infarct pattern PVCs  Wt Readings from Last 3 Encounters:  01/03/20 227 lb (103 kg)  07/26/19 244 lb (110.7 kg)  07/18/19 243 lb (110.2 kg)    Assessment and Plan:  1.  Chronic systolic dysfunction/ ischemic CM/CAD euvolemic today EF has recovered No ischemic symptoms Stable on an appropriate medical regimen followed in ICM device clinic  2. Prior VF arrest 2018 He had VF 11/07/19 while sitting on his couch.  A single appropriate 36J ICD shock therapy was delivered with termination of tachycardia.  No  undersensing of the event. Order BMET, Mg and TSH today No driving x 6 months Normal ICD function See Pace Art report No changes today he is not device dependant today  3. HTN Stable No change required today  4. Valvular heart disease S/p MV repair for endocarditis He had post operative afib but no afib by device interrogation    5. HL Stable No change required today  Risks, benefits and potential toxicities for medications prescribed and/or refilled reviewed with patient today.   EP APP in 6 months Return to see me in a year  Thompson Grayer MD, [facility] 01/03/2020 12:42 PM

## 2020-01-03 NOTE — Patient Instructions (Addendum)
Medication Instructions:  Your physician recommends that you continue on your current medications as directed. Please refer to the Current Medication list given to you today.  *If you need a refill on your cardiac medications before your next appointment, please call your pharmacy*  Lab Work:  You will get labs today BMP, TSH, MAG  If you have labs (blood work) drawn today and your tests are completely normal, you will receive your results only by: Marland Kitchen MyChart Message (if you have MyChart) OR . A paper copy in the mail If you have any lab test that is abnormal or we need to change your treatment, we will call you to review the results.  Testing/Procedures: Your physician has requested that you have an echocardiogram. Echocardiography is a painless test that uses sound waves to create images of your heart. It provides your doctor with information about the size and shape of your heart and how well your heart's chambers and valves are working. This procedure takes approximately one hour. There are no restrictions for this procedure.  Please schedule for ECHO  Follow-Up: At [facility], you and your health needs are our priority.  As part of our continuing mission to provide you with exceptional heart care, we have created designated Provider Care Teams.  These Care Teams include your primary Cardiologist (physician) and Advanced Practice Providers (APPs -  Physician Assistants and Nurse Practitioners) who all work together to provide you with the care you need, when you need it.  We recommend signing up for the patient portal called "MyChart".  Sign up information is provided on this After Visit Summary.  MyChart is used to connect with patients for Virtual Visits (Telemedicine).  Patients are able to view lab/test results, encounter notes, upcoming appointments, etc.  Non-urgent messages can be sent to your provider as well.   To learn more about what you can do with MyChart, go to  [email or web address].    Your next appointment:   Your physician wants you to follow-up in: 6 months with Christopher [name]. You will receive a reminder letter in the mail two months in advance. If you don't receive a letter, please call our office to schedule the follow-up appointment.  Your physician wants you to follow-up in: 1 year with Christopher [name]. You will receive a reminder letter in the mail two months in advance. If you don't receive a letter, please call our office to schedule the follow-up appointment.  Remote monitoring is used to monitor your ICD from home. This monitoring reduces the number of office visits required to check your device to one time per year. It allows [place] to keep an eye on the functioning of your device to ensure it is working properly. You are scheduled for a device check from home on 01/15/2020. You may send your transmission at any time that day. If you have a wireless device, the transmission will be sent automatically. After your physician reviews your transmission, you will receive a postcard with your next transmission date.  Other Instructions:

## 2020-01-04 LAB — BASIC METABOLIC PANEL
BUN/Creatinine Ratio: 14 (ref 10–24)
BUN: 15 mg/dL (ref 8–27)
CO2: 26 mmol/L (ref 20–29)
Calcium: 9.3 mg/dL (ref 8.6–10.2)
Chloride: 94 mmol/L — ABNORMAL LOW (ref 96–106)
Creatinine, Ser: 1.08 mg/dL (ref 0.76–1.27)
GFR calc Af Amer: 80 mL/min/{1.73_m2} (ref >59)
GFR calc non Af Amer: 69 mL/min/{1.73_m2} (ref >59)
Glucose: 319 mg/dL — ABNORMAL HIGH (ref 65–99)
Potassium: 3.9 mmol/L (ref 3.5–5.2)
Sodium: 135 mmol/L (ref 134–144)

## 2020-01-04 LAB — TSH: TSH: 3.78 u[IU]/mL (ref 0.450–4.500)

## 2020-01-04 LAB — MAGNESIUM: Magnesium: 1.9 mg/dL (ref 1.6–2.3)

## 2020-01-15 ENCOUNTER — Ambulatory Visit (INDEPENDENT_AMBULATORY_CARE_PROVIDER_SITE_OTHER): Payer: Medicare Other

## 2020-01-15 DIAGNOSIS — I5022 Chronic systolic (congestive) heart failure: Secondary | ICD-10-CM | POA: Diagnosis not present

## 2020-01-15 DIAGNOSIS — Z95810 Presence of automatic (implantable) cardiac defibrillator: Secondary | ICD-10-CM

## 2020-01-16 ENCOUNTER — Ambulatory Visit (INDEPENDENT_AMBULATORY_CARE_PROVIDER_SITE_OTHER): Payer: Medicare Other | Admitting: Internal Medicine

## 2020-01-16 ENCOUNTER — Other Ambulatory Visit: Payer: Self-pay

## 2020-01-16 ENCOUNTER — Encounter: Payer: Self-pay | Admitting: Internal Medicine

## 2020-01-16 VITALS — BP 134/82 | HR 72 | Temp 98.0°F | Ht 70.0 in | Wt 228.0 lb

## 2020-01-16 DIAGNOSIS — Z8674 Personal history of sudden cardiac arrest: Secondary | ICD-10-CM

## 2020-01-16 DIAGNOSIS — E118 Type 2 diabetes mellitus with unspecified complications: Secondary | ICD-10-CM | POA: Diagnosis not present

## 2020-01-16 DIAGNOSIS — I10 Essential (primary) hypertension: Secondary | ICD-10-CM

## 2020-01-16 LAB — POCT GLYCOSYLATED HEMOGLOBIN (HGB A1C): Hemoglobin A1C: 11.3 % — AB (ref 4.0–5.6)

## 2020-01-16 MED ORDER — EMPAGLIFLOZIN 25 MG PO TABS: 25.0000 mg | ORAL_TABLET | Freq: Every day | ORAL | 1 refills | Status: DC

## 2020-01-16 MED ORDER — METFORMIN HCL 1000 MG PO TABS: 1000.0000 mg | ORAL_TABLET | Freq: Two times a day (BID) | ORAL | 3 refills | Status: DC

## 2020-01-16 NOTE — Assessment & Plan Note (Signed)
BP at goal on metoprolol and ramipril. There is room to add jardiance without risking low BP. Recent BMP by cardiology at goal without indication for change. Should have another BMP at 3 month follow up with adding jardiance.

## 2020-01-16 NOTE — Patient Instructions (Signed)
We have sent in jardiance to take 1 pill daily for the sugars.

## 2020-01-16 NOTE — Progress Notes (Signed)
   Subjective:   Patient ID: Christopher [name], male    DOB: 02-06-1951, 69 y.o.   MRN: [id number]  HPI The patient is a 69 YO man coming in for follow up diabetes (taking metformin, ACE-I and statin, denies new tingling or numbness in feet, HgA1c most recent was 9.7 and we did request he make changes to medication but he did not respond, has been trying to exercise 3 days a week, switched off whiskey to michelob ultra to help with carbs there), and V fib (recent ICD discharge with syncope and V fib on monitor in May, seen cardiology recently and they advised him not to drive for 6 months, he has not remember event, denies chest pains, denies change in medication recently), and blood pressure (taking metoprolol and ramipril and amlodipine, denies lightheadedness or dizziness, did have ICD discharge about 2 months ago, denies chest pains or headaches).   Review of Systems  Constitutional: Negative.   HENT: Negative.   Eyes: Negative.   Respiratory: Negative for cough, chest tightness and shortness of breath.   Cardiovascular: Negative for chest pain, palpitations and leg swelling.  Gastrointestinal: Negative for abdominal distention, abdominal pain, constipation, diarrhea, nausea and vomiting.  Musculoskeletal: Negative.   Skin: Negative.   Neurological: Negative.   Psychiatric/Behavioral: Negative.     Objective:  Physical Exam Constitutional:      Appearance: He is well-developed.  HENT:     Head: Normocephalic and atraumatic.  Cardiovascular:     Rate and Rhythm: Normal rate and regular rhythm.  Pulmonary:     Effort: Pulmonary effort is normal. No respiratory distress.     Breath sounds: Normal breath sounds. No wheezing or rales.  Abdominal:     General: Bowel sounds are normal. There is no distension.     Palpations: Abdomen is soft.     Tenderness: There is no abdominal tenderness. There is no rebound.  Musculoskeletal:     Cervical back: Normal range of motion.  Skin:     General: Skin is warm and dry.     Comments: Foot exam done  Neurological:     Mental Status: He is alert and oriented to person, place, and time.     Coordination: Coordination normal.     Vitals:   01/16/20 1306  BP: 134/82  Pulse: 72  Temp: 98 F (36.7 C)  TempSrc: Oral  SpO2: 97%  Weight: 228 lb (103.4 kg)  Height: 5\' 10"  (1.778 m)    This visit occurred during the SARS-CoV-2 public health emergency.  Safety protocols were in place, including screening questions prior to the visit, additional usage of staff PPE, and extensive cleaning of exam room while observing appropriate contact time as indicated for disinfecting solutions.   Assessment & Plan:

## 2020-01-16 NOTE — Assessment & Plan Note (Signed)
With recent episode V fib and ICD shock at home. Reminded legally he is not to drive for 6 months today. ICD did work appropriately and recent visit to cardiology without change to medications.

## 2020-01-16 NOTE — Assessment & Plan Note (Signed)
Increase metformin to 1000 mg BID and add jardiance 25 mg daily. Uncontrolled and worsening from prior to severely uncontrolled with 11.2 HgA1c. Needs close follow up 3 months. Advised to also work on diet. He is exercising which is good. Weight down about 20 pounds which is likely from sugar diuresis.

## 2020-01-19 NOTE — Progress Notes (Signed)
EPIC Encounter for ICM Monitoring  Patient Name: Christopher [name] is a 69 y.o. male Date: 01/19/2020 Primary Care Physican: Hoyt Koch, MD Primary Cardiologist:End Electrophysiologist:Allred 4/30/2021Weight: 4lbs  Transmission reviewed.   Optivol thoracic impedance normal.  No diuretic  Labs: 01/03/2020 Creatinine 1.08, BUN 15, Potassium 3.9, Sodium 135, GFR 69-80 07/18/2019 Creatinine 1.02, BUN 15, Potassium 3.5, 137, GFR 78.22  Recommendations: No changes  Follow-up plan: ICM clinic phone appointment on8/17/2021. 91 day device clinic remote transmission8/16/2021.    EP/Cardiology Office Visits: Recalls for 01/22/2020 with Dr. Meda Coffee, 04/02/2020 with Oda Kilts, [place] and 12/28/2020 with Dr Rayann Heman.    Copy of ICM check sent to Dr. Rayann Heman.   3 month ICM trend: 01/15/2020    1 Year ICM trend:       Rosalene Billings, RN 01/19/2020 8:48 AM

## 2020-01-26 ENCOUNTER — Other Ambulatory Visit: Payer: Self-pay

## 2020-01-26 ENCOUNTER — Ambulatory Visit (HOSPITAL_COMMUNITY): Payer: Medicare Other | Attending: Cardiology

## 2020-01-26 DIAGNOSIS — I472 Ventricular tachycardia, unspecified: Secondary | ICD-10-CM

## 2020-01-26 DIAGNOSIS — I519 Heart disease, unspecified: Secondary | ICD-10-CM | POA: Diagnosis not present

## 2020-01-26 DIAGNOSIS — I5022 Chronic systolic (congestive) heart failure: Secondary | ICD-10-CM | POA: Diagnosis not present

## 2020-01-26 DIAGNOSIS — I480 Paroxysmal atrial fibrillation: Secondary | ICD-10-CM | POA: Diagnosis not present

## 2020-01-26 DIAGNOSIS — I469 Cardiac arrest, cause unspecified: Secondary | ICD-10-CM | POA: Insufficient documentation

## 2020-01-26 DIAGNOSIS — E782 Mixed hyperlipidemia: Secondary | ICD-10-CM | POA: Diagnosis not present

## 2020-01-26 DIAGNOSIS — I4901 Ventricular fibrillation: Secondary | ICD-10-CM | POA: Insufficient documentation

## 2020-01-26 LAB — ECHOCARDIOGRAM COMPLETE
Area-P 1/2: 1.58 cm2
S' Lateral: 4 cm

## 2020-02-19 ENCOUNTER — Ambulatory Visit (INDEPENDENT_AMBULATORY_CARE_PROVIDER_SITE_OTHER): Payer: Medicare Other | Admitting: *Deleted

## 2020-02-19 DIAGNOSIS — I472 Ventricular tachycardia, unspecified: Secondary | ICD-10-CM

## 2020-02-19 LAB — CUP PACEART REMOTE DEVICE CHECK
Battery Remaining Longevity: 106 mo
Battery Voltage: 3.01 V
Brady Statistic RV Percent Paced: 0.09 %
Date Time Interrogation Session: 20210816012404
HighPow Impedance: 73 Ohm
Implantable Lead Implant Date: 20181102
Implantable Lead Location: 753860
Implantable Pulse Generator Implant Date: 20181102
Lead Channel Impedance Value: 437 Ohm
Lead Channel Impedance Value: 570 Ohm
Lead Channel Pacing Threshold Amplitude: 0.5 V
Lead Channel Pacing Threshold Pulse Width: 0.4 ms
Lead Channel Sensing Intrinsic Amplitude: 5.625 mV
Lead Channel Sensing Intrinsic Amplitude: 5.625 mV
Lead Channel Setting Pacing Amplitude: 2 V
Lead Channel Setting Pacing Pulse Width: 0.4 ms
Lead Channel Setting Sensing Sensitivity: 0.3 mV

## 2020-02-20 ENCOUNTER — Ambulatory Visit (INDEPENDENT_AMBULATORY_CARE_PROVIDER_SITE_OTHER): Payer: Medicare Other

## 2020-02-20 DIAGNOSIS — I5022 Chronic systolic (congestive) heart failure: Secondary | ICD-10-CM | POA: Diagnosis not present

## 2020-02-20 DIAGNOSIS — Z95810 Presence of automatic (implantable) cardiac defibrillator: Secondary | ICD-10-CM

## 2020-02-20 NOTE — Progress Notes (Signed)
Remote ICD transmission.   

## 2020-02-23 NOTE — Progress Notes (Signed)
EPIC Encounter for ICM Monitoring  Patient Name: Christopher [name] is a 69 y.o. male Date: 02/23/2020 Primary Care Physican: Hoyt Koch, MD Primary Cardiologist:End Electrophysiologist:Allred 8/20/2021Weight: 228lbs  Spoke with patient and reports feeling well at this time.  Denies fluid symptoms.  He reports he drinks a lot of fluids and advised to limit 64 oz a day.  Optivol thoracic impedance normal.  No diuretic  Labs: 01/03/2020 Creatinine 1.08, BUN 15, Potassium 3.9, Sodium 135, GFR 69-80 07/18/2019 Creatinine 1.02, BUN 15, Potassium 3.5, 137, GFR 78.22  Recommendations:Recommendation to limit fluid intake to 64 oz daily.  Encouraged to call if experiencing any fluid symptoms.   Follow-up plan: ICM clinic phone appointment on9/20/2021. 91 day device clinic remote transmission11/15/2021.    EP/Cardiology Office Visits: Recalls for 01/22/2020 with Dr. Meda Coffee, 04/02/2020 with Oda Kilts, [place] and 12/28/2020 with Dr Rayann Heman.    Copy of ICM check sent to Dr. Rayann Heman.   3 month ICM trend: 02/19/2020    1 Year ICM trend:       Rosalene Billings, RN 02/23/2020 4:15 PM

## 2020-03-25 ENCOUNTER — Ambulatory Visit (INDEPENDENT_AMBULATORY_CARE_PROVIDER_SITE_OTHER): Payer: Medicare Other

## 2020-03-25 DIAGNOSIS — Z95810 Presence of automatic (implantable) cardiac defibrillator: Secondary | ICD-10-CM | POA: Diagnosis not present

## 2020-03-25 DIAGNOSIS — I5022 Chronic systolic (congestive) heart failure: Secondary | ICD-10-CM

## 2020-03-29 NOTE — Progress Notes (Signed)
EPIC Encounter for ICM Monitoring  Patient Name: Christopher [name] is a 69 y.o. male Date: 03/29/2020 Primary Care Physican: Hoyt Koch, MD Primary Cardiologist:[place] Electrophysiologist:Allred 9/24/2021Weight: 227lbs  Spoke with patient and reports feeling well at this time.  Denies fluid symptoms.   He is looking for a retirement place where he does not have drive.    Optivol thoracic impedance normal.  No diuretic  Labs: 01/03/2020 Creatinine 1.08, BUN 15, Potassium 3.9, Sodium 135, GFR 69-80 07/18/2019 Creatinine 1.02, BUN 15, Potassium 3.5, 137, GFR 78.22  Recommendations:Recommendation to limit fluid intake to 64 oz daily.  Encouraged to call if experiencing any fluid symptoms.   Follow-up plan: ICM clinic phone appointment on10/25/2021. 91 day device clinic remote transmission11/15/2021.   EP/Cardiology Office Visits:Recalls for 01/22/2020 with Dr.Nelson, 04/02/2020 with Oda Kilts, [place] and 12/28/2020 with Dr Rayann Heman.   Copy of ICM check sent to Dr.Allred.   3 month ICM trend: 03/25/2020    1 Year ICM trend:       Rosalene Billings, RN 03/29/2020 11:52 AM

## 2020-03-31 ENCOUNTER — Emergency Department (HOSPITAL_COMMUNITY): Payer: Medicare Other

## 2020-03-31 ENCOUNTER — Other Ambulatory Visit: Payer: Self-pay

## 2020-03-31 ENCOUNTER — Emergency Department (HOSPITAL_COMMUNITY)
Admission: EM | Admit: 2020-03-31 | Discharge: 2020-03-31 | Disposition: A | Discharge status: Home / Self Care | Payer: Medicare Other | Attending: Emergency Medicine | Admitting: Emergency Medicine

## 2020-03-31 ENCOUNTER — Encounter (HOSPITAL_COMMUNITY): Payer: Self-pay | Admitting: Emergency Medicine

## 2020-03-31 DIAGNOSIS — R55 Syncope and collapse: Secondary | ICD-10-CM | POA: Insufficient documentation

## 2020-03-31 DIAGNOSIS — I4891 Unspecified atrial fibrillation: Secondary | ICD-10-CM | POA: Diagnosis not present

## 2020-03-31 DIAGNOSIS — Z79899 Other long term (current) drug therapy: Secondary | ICD-10-CM | POA: Diagnosis not present

## 2020-03-31 DIAGNOSIS — N189 Chronic kidney disease, unspecified: Secondary | ICD-10-CM | POA: Diagnosis not present

## 2020-03-31 DIAGNOSIS — Z87891 Personal history of nicotine dependence: Secondary | ICD-10-CM | POA: Insufficient documentation

## 2020-03-31 DIAGNOSIS — Z95810 Presence of automatic (implantable) cardiac defibrillator: Secondary | ICD-10-CM | POA: Insufficient documentation

## 2020-03-31 DIAGNOSIS — E1122 Type 2 diabetes mellitus with diabetic chronic kidney disease: Secondary | ICD-10-CM | POA: Diagnosis not present

## 2020-03-31 DIAGNOSIS — R079 Chest pain, unspecified: Secondary | ICD-10-CM | POA: Diagnosis not present

## 2020-03-31 DIAGNOSIS — I4901 Ventricular fibrillation: Secondary | ICD-10-CM | POA: Diagnosis not present

## 2020-03-31 DIAGNOSIS — I4510 Unspecified right bundle-branch block: Secondary | ICD-10-CM | POA: Diagnosis not present

## 2020-03-31 DIAGNOSIS — I499 Cardiac arrhythmia, unspecified: Secondary | ICD-10-CM | POA: Diagnosis not present

## 2020-03-31 DIAGNOSIS — I5022 Chronic systolic (congestive) heart failure: Secondary | ICD-10-CM | POA: Insufficient documentation

## 2020-03-31 DIAGNOSIS — R001 Bradycardia, unspecified: Secondary | ICD-10-CM | POA: Diagnosis not present

## 2020-03-31 DIAGNOSIS — Z7984 Long term (current) use of oral hypoglycemic drugs: Secondary | ICD-10-CM | POA: Insufficient documentation

## 2020-03-31 DIAGNOSIS — Z20822 Contact with and (suspected) exposure to covid-19: Secondary | ICD-10-CM | POA: Diagnosis not present

## 2020-03-31 DIAGNOSIS — Z7982 Long term (current) use of aspirin: Secondary | ICD-10-CM | POA: Diagnosis not present

## 2020-03-31 DIAGNOSIS — I130 Hypertensive heart and chronic kidney disease with heart failure and stage 1 through stage 4 chronic kidney disease, or unspecified chronic kidney disease: Secondary | ICD-10-CM | POA: Insufficient documentation

## 2020-03-31 DIAGNOSIS — R42 Dizziness and giddiness: Secondary | ICD-10-CM | POA: Diagnosis not present

## 2020-03-31 DIAGNOSIS — R0789 Other chest pain: Secondary | ICD-10-CM | POA: Diagnosis not present

## 2020-03-31 DIAGNOSIS — I2510 Atherosclerotic heart disease of native coronary artery without angina pectoris: Secondary | ICD-10-CM | POA: Diagnosis not present

## 2020-03-31 DIAGNOSIS — Z743 Need for continuous supervision: Secondary | ICD-10-CM | POA: Diagnosis not present

## 2020-03-31 LAB — CBC
HCT: 46.5 % (ref 39.0–52.0)
Hemoglobin: 15.5 g/dL (ref 13.0–17.0)
MCH: 29.3 pg (ref 26.0–34.0)
MCHC: 33.3 g/dL (ref 30.0–36.0)
MCV: 87.9 fL (ref 80.0–100.0)
Platelets: 201 10*3/uL (ref 150–400)
RBC: 5.29 MIL/uL (ref 4.22–5.81)
RDW: 14.1 % (ref 11.5–15.5)
WBC: 7.5 10*3/uL (ref 4.0–10.5)
nRBC: 0 % (ref 0.0–0.2)

## 2020-03-31 LAB — BASIC METABOLIC PANEL
Anion gap: 12 (ref 5–15)
BUN: 15 mg/dL (ref 8–23)
CO2: 24 mmol/L (ref 22–32)
Calcium: 9.2 mg/dL (ref 8.9–10.3)
Chloride: 103 mmol/L (ref 98–111)
Creatinine, Ser: 1 mg/dL (ref 0.61–1.24)
GFR calc Af Amer: >60 mL/min (ref >60)
GFR calc non Af Amer: >60 mL/min (ref >60)
Glucose, Bld: 115 mg/dL — ABNORMAL HIGH (ref 70–99)
Potassium: 3.5 mmol/L (ref 3.5–5.1)
Sodium: 139 mmol/L (ref 135–145)

## 2020-03-31 LAB — URINALYSIS, ROUTINE W REFLEX MICROSCOPIC
Bacteria, UA: NONE SEEN
Bilirubin Urine: NEGATIVE
Glucose, UA: >=500 mg/dL — AB
Hgb urine dipstick: NEGATIVE
Ketones, ur: NEGATIVE mg/dL
Leukocytes,Ua: NEGATIVE
Nitrite: NEGATIVE
Protein, ur: NEGATIVE mg/dL
Specific Gravity, Urine: 1.033 — ABNORMAL HIGH (ref 1.005–1.030)
pH: 5 (ref 5.0–8.0)

## 2020-03-31 LAB — RESPIRATORY PANEL BY RT PCR (FLU A&B, COVID)
Influenza A by PCR: NEGATIVE
Influenza B by PCR: NEGATIVE
SARS Coronavirus 2 by RT PCR: NEGATIVE

## 2020-03-31 LAB — TROPONIN I (HIGH SENSITIVITY)
Troponin I (High Sensitivity): 11 ng/L (ref <18)
Troponin I (High Sensitivity): 12 ng/L (ref <18)

## 2020-03-31 MED ORDER — SODIUM CHLORIDE 0.9 % IV BOLUS
500.0000 mL | Freq: Once | INTRAVENOUS | Status: AC
  Administered 2020-03-31: 500 mL via INTRAVENOUS

## 2020-03-31 MED ORDER — IOHEXOL 350 MG/ML SOLN
75.0000 mL | Freq: Once | INTRAVENOUS | Status: AC | PRN
  Administered 2020-03-31: 75 mL via INTRAVENOUS

## 2020-03-31 NOTE — ED Notes (Signed)
Patient verbalizes understanding of discharge instructions. Opportunity for questioning and answers were provided. Armband removed by staff, pt discharged from ED ambulatory to home.  

## 2020-03-31 NOTE — Discharge Instructions (Addendum)
Please follow up with your primary care provider within 5-7 days for re-evaluation of your symptoms. If you do not have a primary care provider, information for a healthcare clinic has been provided for you to make arrangements for follow up care. Please return to the emergency department for any new or worsening symptoms. ° °

## 2020-03-31 NOTE — ED Provider Notes (Signed)
[name] Provider Note   CSN: [id number] Arrival date & time: 03/31/20  1439     History Chief Complaint  Patient presents with  . Dizziness    Christ[name] is a 69 y.o. male.  HPI   69 year old male with a history of A. fib, cardiac arrest s/p defibrillator, CKD, CAD, diabetes, GERD, heart murmur, hyperlipidemia, hypertension, s/p mitral valve repair, subacute bacterial endocarditis, V. fib, who presents to the emergency department today for evaluation of dizziness.  Patient states that he was walking in his home when he had sudden onset of lightheadedness/dizziness.  States he felt like he was going to pass out and also the room was spinning.  He tried to walk a few steps but fell backwards onto his couch.  He also reports that he was trying to call EMS at this time but could not read the numbers on his phone.  This all started at 1 PM and lasted for about 20 minutes.  Upon arrival with EMS his symptoms had resolved.  He denies any associated headaches, at this time his vision is back to normal.  He denies that he had any chest pain at the time.  Denies any shortness of breath, nausea, vomiting, numbness, weakness.  He did not feel his defibrillator fire.  He reports that he has had a "funny feeling" to the left side of his chest on and off for the last 2 weeks.  He states that it is nonexertional.  He does not have any pain at present nor did he during the episode.  Past Medical History:  Diagnosis Date  . Allergy   . Arthritis   . Atrial fibrillation ([place])    post op, intol of anticoag  . Cardiac arrest [facility])    a. s/p MDT single chamber ICD; 11-10-18- pt denies having a heart attack  . Chronic kidney disease    kidney stone  . Coronary artery disease    a. 2/7 Cath: LM nl, LAD min irregs, LCX min irregs, RI 40, RCA 66m, EF 55-60% basal to mid inf HK, 3-4+ MR;  b. 08/25/2012 PCI of RCA with 4.0x15 Vision BMS  . Diabetes mellitus without  complication ([place])   . GERD (gastroesophageal reflux disease)    hx  . Heart murmur   . Hyperlipidemia    on statin  . Hypertension   . 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 Severe mitral regurgitation    a. Mitral valve prolapse with flail segment of posterior leaflet and severe MR by TEE, remote h/o bacterial endocarditis   . Sleep apnea    NPSG 01/21/06- AHI 40.7/hr cpap  . Subacute bacterial endocarditis 03/22/2008   Strep viridans  . Ventricular fibrillation ([place]) 11/07/2019   appropriate shock (36J) for VF delivered    Patient Active Problem List   Diagnosis Date Noted  . Ventricular tachycardia ([place]) 01/03/2020  . Nonischemic cardiomyopathy ([place]) 09/27/2017  . Heart valve disease 09/27/2017  . History of cardiac arrest 09/27/2017  . Diabetes mellitus type 2 with complications ([place]) 48/18/5631  . Alcohol use 05/05/2017  . CAD (coronary artery disease) 05/05/2017  . OSA (obstructive sleep apnea) 05/05/2017  . History of repair of mitral valve 05/05/2017  . Benign prostatic hyperplasia 05/16/2015  . Routine general medical examination at a health care facility 11/13/2014  .  Chronic systolic CHF (congestive heart failure) ([place]) 10/25/2012  . Atrial fibrillation ([place]) 10/25/2012  . Hyperlipidemia associated with type 2 diabetes mellitus ([place]) 09/04/2012  . Severe mitral regurgitation   . Coronary artery disease 08/12/2012  . Essential hypertension 09/27/2008  . SLEEP APNEA 09/27/2008    Past Surgical History:  Procedure Laterality Date  . AMPUTATION  07/28/2012   Procedure: AMPUTATION DIGIT;  Surgeon: Colin Rhein, MD;  Location: [facility];  Service: Orthopedics;  Laterality: Right;  2nd toe  . CARDIAC CATHETERIZATION    . COLONOSCOPY    . EP IMPLANTABLE DEVICE N/A 05/01/2016   Procedure: Loop Recorder Removal;   Surgeon: Thompson Grayer, MD;  Location: [place] CV LAB;  Service: Cardiovascular;  Laterality: N/A;  . FOOT SURGERY     BILATERAL TOES  . ICD IMPLANT N/A 05/07/2017    Medtronic Visia AF MRI VR SureScan implanted by Dr Curt Bears following VF arrest  . Implantable loop recorder placement  03/31/13   MDT Linq implanted by Dr Rayann Heman to evaluate for further afib  . INTRAOPERATIVE TRANSESOPHAGEAL ECHOCARDIOGRAM N/A 09/27/2012   Procedure: INTRAOPERATIVE TRANSESOPHAGEAL ECHOCARDIOGRAM;  Surgeon: [name], MD;  Location: [place];  Service: Open Heart Surgery;  Laterality: N/A;  . LEFT AND RIGHT HEART CATHETERIZATION WITH CORONARY ANGIOGRAM N/A 08/12/2012   Procedure: LEFT AND RIGHT HEART CATHETERIZATION WITH CORONARY ANGIOGRAM;  Surgeon: [name], MD;  Location: [facility] CATH LAB;  Service: Cardiovascular;  Laterality: N/A;  . LEFT HEART CATH AND CORONARY ANGIOGRAPHY N/A 05/06/2017   Procedure: LEFT HEART CATH AND CORONARY ANGIOGRAPHY;  Surgeon: [name], MD;  Location: [place] CV LAB;  Service: Cardiovascular;  Laterality: N/A;  . LOOP RECORDER IMPLANT N/A 03/31/2013   Procedure: LOOP RECORDER IMPLANT;  Surgeon: [name], MD;  Location: [place] CATH LAB;  Service: Cardiovascular;  Laterality: N/A;  . MITRAL VALVE REPAIR Right 09/27/2012   Procedure: MINIMALLY INVASIVE MITRAL VALVE REPAIR (MVR);  Surgeon: [name], MD;  Location: [place];  Service: Open Heart Surgery;  Laterality: Right;  Ultrasound guided  . PERCUTANEOUS CORONARY STENT INTERVENTION (PCI-S) N/A 08/25/2012   Procedure: PERCUTANEOUS CORONARY STENT INTERVENTION (PCI-S);  Surgeon: Sherren Mocha, MD;  Location: [facility] CATH LAB;  Service: Cardiovascular;  Laterality: N/A;  . SEPTOPLASTY     Dr. [name]  . TEE WITHOUT CARDIOVERSION N/A 08/12/2012   Procedure: TRANSESOPHAGEAL ECHOCARDIOGRAM (TEE);  Surgeon: [name], MD;  Location: [place];  Service: Cardiovascular;  Laterality: N/A;  . TONSILLECTOMY    .  UVULOPALATOPHARYNGOPLASTY         Family History  Problem Relation Age of Onset  . Heart disease Father   . Colon cancer Father 28  . Diabetes Father   . Renal cancer Father   . Diabetes Mother   . Stroke Mother   . Sudden death Brother   . Heart attack Brother   . Esophageal cancer Neg Hx   . Rectal cancer Neg Hx   . Stomach cancer Neg Hx     Social History   Tobacco Use  . Smoking status: Former Smoker    Packs/day: 2.50    Years: 5.00    Pack years: 12.50    Types: Cigarettes    Quit date: 07/06/1978    Years since quitting: 41.7  . Smokeless tobacco: Never Used  . Tobacco comment: social drinker  Vaping Use  . Vaping Use: Never used  Substance Use Topics  . Alcohol use: Yes  Comment: OCC.  . Drug use: No    Home Medications Prior to Admission medications   Medication Sig Start Date End Date Taking? Authorizing Provider  acetaminophen (TYLENOL) 500 MG tablet Take 500 mg by mouth every 6 (six) hours as needed for mild pain or headache.    Yes [provider]  amLODipine (NORVASC) 5 MG tablet Take 1 tablet (5 mg total) by mouth daily. 07/18/19  Yes Hoyt Koch, MD  aspirin EC 81 MG tablet Take 81 mg by mouth daily.   Yes [provider]  atorvastatin (LIPITOR) 40 MG tablet Take 1 tablet (40 mg total) by mouth daily. 07/18/19  Yes Hoyt Koch, MD  cetirizine (ZYRTEC) 10 MG tablet Take 10 mg by mouth daily.   Yes [provider]  cholecalciferol (VITAMIN D) 25 MCG (1000 UNIT) tablet Take 1 tablet by mouth daily.    Yes [provider]  empagliflozin (JARDIANCE) 25 MG TABS tablet Take 1 tablet (25 mg total) by mouth daily before breakfast. 01/16/20  Yes Hoyt Koch, MD  fenofibrate (TRICOR) 145 MG tablet Take 1 tablet (145 mg total) by mouth daily. 07/18/19  Yes Hoyt Koch, MD  fluticasone (FLONASE) 50 MCG/ACT nasal spray USE 2 SPRAYS IN BOTH  NOSTRILS DAILY Patient taking differently: Place 2  sprays into both nostrils daily.  09/05/19  Yes Hoyt Koch, MD  metFORMIN (GLUCOPHAGE) 1000 MG tablet Take 1 tablet (1,000 mg total) by mouth 2 (two) times daily with a meal. 01/16/20  Yes Hoyt Koch, MD  metoprolol succinate (TOPROL-XL) 100 MG 24 hr tablet TAKE 1 TABLET BY MOUTH  DAILY OR IMMEDIATELY  FOLLOWING A MEAL Patient taking differently: Take 100 mg by mouth every evening. TAKE 1 TABLET BY MOUTH  DAILY OR IMMEDIATELY  FOLLOWING A MEAL 07/18/19  Yes Hoyt Koch, MD  ramipril (ALTACE) 10 MG capsule Take 1 capsule (10 mg total) by mouth daily. 07/18/19  Yes Hoyt Koch, MD  tamsulosin (FLOMAX) 0.4 MG CAPS capsule Take 1 capsule (0.4 mg total) by mouth daily. 07/18/19  Yes Hoyt Koch, MD    Allergies    Codeine, Dust mite extract, and Pollen extract  Review of Systems   Review of Systems  Constitutional: Negative for chills and fever.  HENT: Negative for ear pain and sore throat.   Eyes: Negative for visual disturbance.  Respiratory: Negative for cough and shortness of breath.   Cardiovascular: Positive for chest pain ("funny feeling" in chest). Negative for palpitations.  Gastrointestinal: Negative for abdominal pain, constipation, diarrhea, nausea and vomiting.  Genitourinary: Negative for dysuria and hematuria.  Musculoskeletal: Negative for back pain.  Skin: Negative for rash.  Neurological: Positive for dizziness and light-headedness. Negative for seizures.       Near syncope  All other systems reviewed and are negative.   Physical Exam Updated Vital Signs BP (!) 169/78   Pulse 65   Temp 98.1 F (36.7 C) (Oral)   Resp 15   SpO2 98%   Physical Exam Vitals and nursing note reviewed.  Constitutional:      Appearance: He is well-developed.  HENT:     Head: Normocephalic and atraumatic.  Eyes:     Conjunctiva/sclera: Conjunctivae normal.  Cardiovascular:     Rate and Rhythm: Normal rate and regular rhythm.     Heart  sounds: Normal heart sounds. No murmur heard.   Pulmonary:     Effort: Pulmonary effort is normal. No respiratory distress.  Breath sounds: Normal breath sounds. No wheezing, rhonchi or rales.  Abdominal:     General: Bowel sounds are normal.     Palpations: Abdomen is soft.     Tenderness: There is no abdominal tenderness. There is no guarding or rebound.  Musculoskeletal:     Cervical back: Neck supple.  Skin:    General: Skin is warm and dry.  Neurological:     Mental Status: He is alert.     Comments: Mental Status:  Alert, thought content appropriate, able to give a coherent history. Speech fluent without evidence of aphasia. Able to follow 2 step commands without difficulty.  Cranial Nerves:  II:  Peripheral visual fields grossly normal, pupils equal, round, reactive to light III,IV, VI: ptosis not present, extra-ocular motions intact bilaterally  V,VII: smile symmetric, facial light touch sensation equal VIII: hearing grossly normal to voice  X: uvula elevates symmetrically  XI: bilateral shoulder shrug symmetric and strong XII: midline tongue extension without fassiculations Motor:  Normal tone. 5/5 strength of BUE and BLE major muscle groups including strong and equal grip strength and dorsiflexion/plantar flexion Sensory: light touch normal in all extremities. Cerebellar: normal finger-to-nose with bilateral upper extremities, normal heel to shin Gait: normal gait and balance.       ED Results / Procedures / Treatments   Labs (all labs ordered are listed, but only abnormal results are displayed) Labs Reviewed  BASIC METABOLIC PANEL - Abnormal; Notable for the following components:      Result Value   Glucose, Bld 115 (*)    All other components within normal limits  URINALYSIS, ROUTINE W REFLEX MICROSCOPIC - Abnormal; Notable for the following components:   Specific Gravity, Urine 1.033 (*)    Glucose, UA >=500 (*)    All other components within normal limits   RESPIRATORY PANEL BY RT PCR (FLU A&B, COVID)  CBC  TROPONIN I (HIGH SENSITIVITY)  TROPONIN I (HIGH SENSITIVITY)    EKG EKG Interpretation  Date/Time:  Sunday March 31 2020 14:35:42 EDT Ventricular Rate:  69 PR Interval:  208 QRS Duration: 156 QT Interval:  478 QTC Calculation: 512 R Axis:   -69 Text Interpretation: Sinus rhythm with occasional Premature ventricular complexes Right bundle branch block Left anterior fascicular block Bifascicular block Inferior infarct , age undetermined Abnormal ECG No significant change since last tracing Confirmed by Gareth Morgan [phone]) on 03/31/2020 5:06:51 PM   Radiology CT Angio Head W or Wo Contrast  Result Date: 03/31/2020 CLINICAL DATA:  Initial evaluation for acute dizziness. History of cardiac arrest. EXAM: CT ANGIOGRAPHY HEAD AND NECK TECHNIQUE: Multidetector CT imaging of the head and neck was performed using the standard protocol during bolus administration of intravenous contrast. Multiplanar CT image reconstructions and MIPs were obtained to evaluate the vascular anatomy. Carotid stenosis measurements (when applicable) are obtained utilizing NASCET criteria, using the distal internal carotid diameter as the denominator. CONTRAST:  52mL OMNIPAQUE IOHEXOL 350 MG/ML SOLN COMPARISON:  Prior head CT from earlier the same day. FINDINGS: CTA NECK FINDINGS Aortic arch: Visualized aortic arch of normal caliber with normal branch pattern. Mild atheromatous change about the arch itself. No flow-limiting stenosis about the origin of the great vessels. Right carotid system: Right common and internal carotid arteries widely patent without stenosis, dissection or occlusion. Minimal plaque at the right bifurcation without stenosis. Left carotid system: Left common and internal carotid arteries widely patent without stenosis, dissection or occlusion. Vertebral arteries: Both vertebral arteries arise from the subclavian arteries. No proximal  subclavian  artery stenosis. Left vertebral artery slightly dominant. Vertebral arteries widely patent without stenosis, dissection or occlusion. Skeleton: No acute osseous abnormality. Asymmetric sclerosis involving the left aspect of the C5 vertebral body with extension into the posterior elements is stable from previous, likely benign given stability. No other discrete or worrisome osseous lesions. Mild-to-moderate multilevel cervical spondylosis. Other neck: No other acute soft tissue abnormality within the neck. 3 mm hypodense left thyroid nodule noted, felt to be of doubtful significance given size and patient age. No follow-up imaging recommended regarding this lesion. No other mass lesion or adenopathy. Upper chest: Few borderline prominent precarinal lymph nodes measure up to 1 cm. Scattered interlobular septal thickening noted within the visualized lungs, suggesting pulmonary interstitial congestion/edema. Left-sided pacemaker/AICD noted. Review of the MIP images confirms the above findings CTA HEAD FINDINGS Anterior circulation: Both internal carotid arteries widely patent to the termini without stenosis or other abnormality. A1 segments widely patent. Normal anterior communicating artery complex. Anterior cerebral arteries widely patent to their distal aspects without stenosis. No M1 stenosis or occlusion. Normal MCA bifurcations. Distal MCA branches well perfused and symmetric. Posterior circulation: Vertebral arteries widely patent to the vertebrobasilar junction without stenosis. Right PICA patent. Left PICA not seen. Basilar widely patent to its distal aspect without stenosis. Superior cerebral arteries patent bilaterally. Left PCA supplied via the basilar. Right PCA supplied via a hypoplastic right P1 segment and robust right posterior communicating artery. Both PCAs well perfused to their distal aspects. Venous sinuses: Grossly patent allowing for timing the contrast bolus. Anatomic variants: None significant.   No intracranial aneurysm. Review of the MIP images confirms the above findings IMPRESSION: 1. Negative CTA of the head and neck. No large vessel occlusion, hemodynamically significant stenosis, or other acute vascular abnormality. 2. Scattered interlobular septal thickening within the visualized lungs, suggesting pulmonary interstitial congestion/edema. Electronically Signed   By: Jeannine Boga M.D.   On: 03/31/2020 21:58   DG Chest 2 View  Result Date: 03/31/2020 CLINICAL DATA:  Chest pain EXAM: CHEST - 2 VIEW COMPARISON:  05/08/2017 FINDINGS: Cardiac shadow is mildly enlarged but stable. Defibrillator is again noted. The lungs are well aerated bilaterally. No focal infiltrate or effusion is seen. No bony abnormality is noted. IMPRESSION: No active cardiopulmonary disease. Electronically Signed   By: Inez Catalina M.D.   On: 03/31/2020 16:01   CT Head Wo Contrast  Result Date: 03/31/2020 CLINICAL DATA:  Nonspecific dizziness. EXAM: CT HEAD WITHOUT CONTRAST TECHNIQUE: Contiguous axial images were obtained from the base of the skull through the vertex without intravenous contrast. COMPARISON:  Head CT 05/05/2017 FINDINGS: Brain: Age related atrophy with minor chronic small vessel ischemia. No intracranial hemorrhage, mass effect, or midline shift. No hydrocephalus. The basilar cisterns are patent. No evidence of territorial infarct or acute ischemia. No extra-axial or intracranial fluid collection. Vascular: No hyperdense vessel. Skull: No fracture or focal lesion. Sinuses/Orbits: Significant improvement in the left mastoid air cell opacification from prior exam, few lower mastoid air cells remain opacified, otherwise well aerated. Right mastoid air cells are clear. Chronic mucosal thickening of scattered ethmoid air cells and left frontal sinus. Orbits are unremarkable. Other: None. IMPRESSION: 1. No acute intracranial abnormality. 2. Age related atrophy and minor chronic small vessel ischemia. 3.  Mild chronic paranasal sinus mucosal thickening. Electronically Signed   By: Keith Rake M.D.   On: 03/31/2020 18:45   CT Angio Neck W and/or Wo Contrast  Result Date: 03/31/2020 CLINICAL DATA:  Initial evaluation for acute  dizziness. History of cardiac arrest. EXAM: CT ANGIOGRAPHY HEAD AND NECK TECHNIQUE: Multidetector CT imaging of the head and neck was performed using the standard protocol during bolus administration of intravenous contrast. Multiplanar CT image reconstructions and MIPs were obtained to evaluate the vascular anatomy. Carotid stenosis measurements (when applicable) are obtained utilizing NASCET criteria, using the distal internal carotid diameter as the denominator. CONTRAST:  30mL OMNIPAQUE IOHEXOL 350 MG/ML SOLN COMPARISON:  Prior head CT from earlier the same day. FINDINGS: CTA NECK FINDINGS Aortic arch: Visualized aortic arch of normal caliber with normal branch pattern. Mild atheromatous change about the arch itself. No flow-limiting stenosis about the origin of the great vessels. Right carotid system: Right common and internal carotid arteries widely patent without stenosis, dissection or occlusion. Minimal plaque at the right bifurcation without stenosis. Left carotid system: Left common and internal carotid arteries widely patent without stenosis, dissection or occlusion. Vertebral arteries: Both vertebral arteries arise from the subclavian arteries. No proximal subclavian artery stenosis. Left vertebral artery slightly dominant. Vertebral arteries widely patent without stenosis, dissection or occlusion. Skeleton: No acute osseous abnormality. Asymmetric sclerosis involving the left aspect of the C5 vertebral body with extension into the posterior elements is stable from previous, likely benign given stability. No other discrete or worrisome osseous lesions. Mild-to-moderate multilevel cervical spondylosis. Other neck: No other acute soft tissue abnormality within the neck. 3 mm  hypodense left thyroid nodule noted, felt to be of doubtful significance given size and patient age. No follow-up imaging recommended regarding this lesion. No other mass lesion or adenopathy. Upper chest: Few borderline prominent precarinal lymph nodes measure up to 1 cm. Scattered interlobular septal thickening noted within the visualized lungs, suggesting pulmonary interstitial congestion/edema. Left-sided pacemaker/AICD noted. Review of the MIP images confirms the above findings CTA HEAD FINDINGS Anterior circulation: Both internal carotid arteries widely patent to the termini without stenosis or other abnormality. A1 segments widely patent. Normal anterior communicating artery complex. Anterior cerebral arteries widely patent to their distal aspects without stenosis. No M1 stenosis or occlusion. Normal MCA bifurcations. Distal MCA branches well perfused and symmetric. Posterior circulation: Vertebral arteries widely patent to the vertebrobasilar junction without stenosis. Right PICA patent. Left PICA not seen. Basilar widely patent to its distal aspect without stenosis. Superior cerebral arteries patent bilaterally. Left PCA supplied via the basilar. Right PCA supplied via a hypoplastic right P1 segment and robust right posterior communicating artery. Both PCAs well perfused to their distal aspects. Venous sinuses: Grossly patent allowing for timing the contrast bolus. Anatomic variants: None significant.  No intracranial aneurysm. Review of the MIP images confirms the above findings IMPRESSION: 1. Negative CTA of the head and neck. No large vessel occlusion, hemodynamically significant stenosis, or other acute vascular abnormality. 2. Scattered interlobular septal thickening within the visualized lungs, suggesting pulmonary interstitial congestion/edema. Electronically Signed   By: Jeannine Boga M.D.   On: 03/31/2020 21:58    Procedures Procedures (including critical care time)  Medications  Ordered in ED Medications  sodium chloride 0.9 % bolus 500 mL (0 mLs Intravenous Stopped 03/31/20 2109)  iohexol (OMNIPAQUE) 350 MG/ML injection 75 mL (75 mLs Intravenous Contrast Given 03/31/20 2117)    ED Course  I have reviewed the triage vital signs and the nursing notes.  Pertinent labs & imaging results that were available during my care of the patient were reviewed by me and considered in my medical decision making (see chart for details).    MDM Rules/Calculators/A&P  69 year old male presenting for evaluation of lightheadedness, dizziness, near syncope, blurred vision  Vs reassuring on eval. Orthostatics are neg  Reviewed/interpreted -  CBC nonacute BMP nonacute Trops neg UA neg  EKG with Sinus rhythm with occasional Premature ventricular complexes Right bundle branch block Left anterior fascicular block  Bifascicular block  Inferior infarct , age undetermined Abnormal ECG No significant change since last tracing   Imaging reviewed/interpreted CXR - No active cardiopulmonary disease CT head - 1. No acute intracranial abnormality. 2. Age related atrophy and minor chronic small vessel ischemia. 3. Mild chronic paranasal sinus mucosal thickening.  medtronic defib was interrogated and did not show any shocks or runs of VT/VF. There was some evidence of hypovolemia.  8:42 PM Dr. Billy Fischer, supervising physician, spoke with Dr. Cheral Marker with neurology regarding patient sxs. He recommends CTA head/neck and if no red flags, pt likely safe for d/c home  CTA head/neck - 1. Negative CTA of the head and neck. No large vessel occlusion, hemodynamically significant stenosis, or other acute vascular abnormality. 2. Scattered interlobular septal thickening within the visualized lungs, suggesting pulmonary interstitial congestion/edema   Imaging is reassuring. With regard to lung findings, COVID test added. Pt does not appear to be fluid overloaded clinically and  actually appear hypovolemic on his defibrillator interrogation. Not hypoxic and have low suspicion for chf exac. Doubt ACS. sxs today seem more consistent with presyncope rather than secondary to acute neurologic or cardiac cause. Feel he is appropriate for f/u with pcp and return to the ED for new or worsening sxs. He voices understanding of the plan and reasons to return. All questions answered, pt stable for discharge.   Final Clinical Impression(s) / ED Diagnoses Final diagnoses:  Near syncope  Dizziness    Rx / DC Orders ED Discharge Orders    None       Bishop Dublin 03/31/20 2233    Gareth Morgan, MD 04/01/20 0045

## 2020-03-31 NOTE — ED Triage Notes (Signed)
Pt to triage via [facility] from home.  Reports L sided chest discomfort x 1 week.  Today had sudden onset of dizziness, pale, and diaphoretic.  Denies pain at present. 22g L hand.  No neuro deficits noted.

## 2020-04-15 ENCOUNTER — Telehealth: Payer: Self-pay | Admitting: Internal Medicine

## 2020-04-15 NOTE — Progress Notes (Signed)
  Chronic Care Management   Note  04/15/2020 Name: BRENIN HEIDELBERGER MRN: [id number] DOB: 1951-01-09  DESHAN HEMMELGARN is a 69 y.o. year old male who is a primary care patient of Hoyt Koch, MD. I reached out to Juleen Starr by phone today in response to a referral sent by Mr. Ashir Kunz Worster's PCP, Hoyt Koch, MD.   Mr. Jasperson was given information about Chronic Care Management services today including:  1. CCM service includes personalized support from designated clinical staff supervised by his physician, including individualized plan of care and coordination with other care providers 2. 24/7 contact phone numbers for assistance for urgent and routine care needs. 3. Service will only be billed when office clinical staff spend 20 minutes or more in a month to coordinate care. 4. Only one practitioner may furnish and bill the service in a calendar month. 5. The patient may stop CCM services at any time (effective at the end of the month) by phone call to the office staff.   Patient agreed to services and verbal consent obtained.   Follow up plan:   Carley Perdue [employer] Scheduler

## 2020-04-16 ENCOUNTER — Ambulatory Visit: Payer: Medicare Other | Admitting: Internal Medicine

## 2020-04-16 DIAGNOSIS — E119 Type 2 diabetes mellitus without complications: Secondary | ICD-10-CM | POA: Diagnosis not present

## 2020-04-16 LAB — HM DIABETES EYE EXAM

## 2020-04-29 ENCOUNTER — Ambulatory Visit (INDEPENDENT_AMBULATORY_CARE_PROVIDER_SITE_OTHER): Payer: Medicare Other

## 2020-04-29 DIAGNOSIS — Z95810 Presence of automatic (implantable) cardiac defibrillator: Secondary | ICD-10-CM

## 2020-04-29 DIAGNOSIS — I5022 Chronic systolic (congestive) heart failure: Secondary | ICD-10-CM | POA: Diagnosis not present

## 2020-05-06 NOTE — Progress Notes (Signed)
EPIC Encounter for ICM Monitoring  Patient Name: Christopher [name] is a 69 y.o. male Date: 05/06/2020 Primary Care Physican: Hoyt Koch, MD Primary Cardiologist:Nelson Electrophysiologist:Allred 9/24/2021Weight: 227lbs  Transmission reviewed.  Optivol thoracic impedance normal.  No diuretic  Labs: 03/31/2020 Creatinine 1.00, BUN 15, Potassium 3.5, Sodium 139, GFR >60 01/03/2020 Creatinine 1.08, BUN 15, Potassium 3.9, Sodium 135, GFR 69-80 07/18/2019 Creatinine 1.02, BUN 15, Potassium 3.5, 137, GFR 78.22  Recommendations:  No changes.   Follow-up plan: ICM clinic phone appointment on11/29/2021. 91 day device clinic remote transmission11/15/2021.   EP/Cardiology Office Visits:Recalls for 01/22/2020 with Dr.Nelson, 04/02/2020 with Oda Kilts, PA and 12/28/2020 with Dr Rayann Heman.   Copy of ICM check sent to Dr.Allred.   3 month ICM trend: 04/29/2020    1 Year ICM trend:       Rosalene Billings, RN 05/06/2020 11:31 AM

## 2020-05-13 ENCOUNTER — Other Ambulatory Visit: Payer: Self-pay | Admitting: Internal Medicine

## 2020-05-16 ENCOUNTER — Other Ambulatory Visit: Payer: Self-pay

## 2020-05-16 ENCOUNTER — Encounter: Payer: Self-pay | Admitting: Internal Medicine

## 2020-05-16 ENCOUNTER — Ambulatory Visit (INDEPENDENT_AMBULATORY_CARE_PROVIDER_SITE_OTHER): Payer: Medicare Other | Admitting: Internal Medicine

## 2020-05-16 VITALS — BP 138/78 | HR 72 | Temp 98.1°F | Ht 70.0 in | Wt 229.0 lb

## 2020-05-16 DIAGNOSIS — E118 Type 2 diabetes mellitus with unspecified complications: Secondary | ICD-10-CM | POA: Diagnosis not present

## 2020-05-16 DIAGNOSIS — Z23 Encounter for immunization: Secondary | ICD-10-CM

## 2020-05-16 LAB — COMPREHENSIVE METABOLIC PANEL
ALT: 23 U/L (ref 0–53)
AST: 23 U/L (ref 0–37)
Albumin: 4.3 g/dL (ref 3.5–5.2)
Alkaline Phosphatase: 41 U/L (ref 39–117)
BUN: 20 mg/dL (ref 6–23)
CO2: 28 mEq/L (ref 19–32)
Calcium: 9.4 mg/dL (ref 8.4–10.5)
Chloride: 102 mEq/L (ref 96–112)
Creatinine, Ser: 1 mg/dL (ref 0.40–1.50)
GFR: 76.13 mL/min (ref >60.00)
Glucose, Bld: 129 mg/dL — ABNORMAL HIGH (ref 70–99)
Potassium: 3.3 mEq/L — ABNORMAL LOW (ref 3.5–5.1)
Sodium: 138 mEq/L (ref 135–145)
Total Bilirubin: 0.6 mg/dL (ref 0.2–1.2)
Total Protein: 6.9 g/dL (ref 6.0–8.3)

## 2020-05-16 LAB — HEMOGLOBIN A1C: Hgb A1c MFr Bld: 6.3 % (ref 4.6–6.5)

## 2020-05-16 NOTE — Patient Instructions (Addendum)
We will check the labs today and evaluate if you need the jardiance or not.

## 2020-05-16 NOTE — Progress Notes (Signed)
   Subjective:   Patient ID: Christopher [name], male    DOB: Nov 28, 1950, 69 y.o.   MRN: [id number]  HPI The patient is a 69 YO man coming in for follow up diabetes. Started on jardiance and increase in dosage to metformin about 4 months ago for moderate exacerbation of his diabetes. He is taking appropriately and denies side effects. Denies low sugars. Does not check readings at home. Denies new numbness or tingling. Vania Rea is expensive and he would like to switch if possible if there are alternatives.   Review of Systems  Constitutional: Negative.   HENT: Negative.   Eyes: Negative.   Respiratory: Negative for cough, chest tightness and shortness of breath.   Cardiovascular: Negative for chest pain, palpitations and leg swelling.  Gastrointestinal: Negative for abdominal distention, abdominal pain, constipation, diarrhea, nausea and vomiting.  Musculoskeletal: Negative.   Skin: Negative.   Neurological: Negative.   Psychiatric/Behavioral: Negative.     Objective:  Physical Exam Constitutional:      Appearance: He is well-developed.  HENT:     Head: Normocephalic and atraumatic.  Cardiovascular:     Rate and Rhythm: Normal rate and regular rhythm.  Pulmonary:     Effort: Pulmonary effort is normal. No respiratory distress.     Breath sounds: Normal breath sounds. No wheezing or rales.  Abdominal:     General: Bowel sounds are normal. There is no distension.     Palpations: Abdomen is soft.     Tenderness: There is no abdominal tenderness. There is no rebound.  Musculoskeletal:     Cervical back: Normal range of motion.  Skin:    General: Skin is warm and dry.  Neurological:     Mental Status: He is alert and oriented to person, place, and time.     Coordination: Coordination normal.     Vitals:   05/16/20 1507  BP: 138/78  Pulse: 72  Temp: 98.1 F (36.7 C)  TempSrc: Oral  SpO2: 96%  Weight: 229 lb (103.9 kg)  Height: 5\' 10"  (1.778 m)    This visit occurred  during the SARS-CoV-2 public health emergency.  Safety protocols were in place, including screening questions prior to the visit, additional usage of staff PPE, and extensive cleaning of exam room while observing appropriate contact time as indicated for disinfecting solutions.   Assessment & Plan:  Flu shot given at visit

## 2020-05-17 NOTE — Assessment & Plan Note (Signed)
Checking HgA1c and adjust metformin and jardiance if needed. Prior control not controlled with moderate exacerbation.

## 2020-05-18 ENCOUNTER — Ambulatory Visit: Payer: Medicare Other | Attending: Internal Medicine

## 2020-05-18 DIAGNOSIS — Z23 Encounter for immunization: Secondary | ICD-10-CM

## 2020-05-18 NOTE — Progress Notes (Signed)
   Covid-19 Vaccination Clinic  Name:  JOSHAUA EPPLE    MRN: [id number] DOB: May 21, 1951  05/18/2020  Mr. Mcnay was observed post Covid-19 immunization for 15 minutes without incident. He was provided with Vaccine Information Sheet and instruction to access the V-Safe system.   Mr. Sofranko was instructed to call 911 with any severe reactions post vaccine: Marland Kitchen Difficulty breathing  . Swelling of face and throat  . A fast heartbeat  . A bad rash all over body  . Dizziness and weakness   Immunizations Administered    Name Date Dose VIS Date Route   Pfizer COVID-19 Vaccine 05/18/2020  2:48 PM 0.3 mL 04/24/2020 Intramuscular   Manufacturer: [place]   Lot: [id number]   [place]: [address]-0

## 2020-05-20 ENCOUNTER — Ambulatory Visit (INDEPENDENT_AMBULATORY_CARE_PROVIDER_SITE_OTHER): Payer: Medicare Other

## 2020-05-20 DIAGNOSIS — I428 Other cardiomyopathies: Secondary | ICD-10-CM | POA: Diagnosis not present

## 2020-05-20 LAB — CUP PACEART REMOTE DEVICE CHECK
Battery Remaining Longevity: 102 mo
Battery Voltage: 2.98 V
Brady Statistic RV Percent Paced: 0.07 %
Date Time Interrogation Session: 20211115082505
HighPow Impedance: 74 Ohm
Implantable Lead Implant Date: 20181102
Implantable Lead Location: 753860
Implantable Pulse Generator Implant Date: 20181102
Lead Channel Impedance Value: 551 Ohm
Lead Channel Impedance Value: 627 Ohm
Lead Channel Pacing Threshold Amplitude: 0.5 V
Lead Channel Pacing Threshold Pulse Width: 0.4 ms
Lead Channel Sensing Intrinsic Amplitude: 6.125 mV
Lead Channel Sensing Intrinsic Amplitude: 6.125 mV
Lead Channel Setting Pacing Amplitude: 2 V
Lead Channel Setting Pacing Pulse Width: 0.4 ms
Lead Channel Setting Sensing Sensitivity: 0.3 mV

## 2020-05-21 NOTE — Progress Notes (Signed)
Remote ICD transmission.   

## 2020-06-03 ENCOUNTER — Ambulatory Visit (INDEPENDENT_AMBULATORY_CARE_PROVIDER_SITE_OTHER): Payer: Medicare Other

## 2020-06-03 DIAGNOSIS — I5022 Chronic systolic (congestive) heart failure: Secondary | ICD-10-CM | POA: Diagnosis not present

## 2020-06-03 DIAGNOSIS — Z95810 Presence of automatic (implantable) cardiac defibrillator: Secondary | ICD-10-CM

## 2020-06-07 NOTE — Progress Notes (Signed)
EPIC Encounter for ICM Monitoring  Patient Name: Christopher [name] is a 69 y.o. male Date: 06/07/2020 Primary Care Physican: Hoyt Koch, MD Primary Cardiologist:Nelson Electrophysiologist:Allred 12/3/2021Weight: 231lbs  Spoke with patient and reports feeling well at this time.  Denies fluid symptoms.  He has gained a couple of pounds after the Thanksgiving holiday.  Optivol thoracic impedance normal.  No diuretic  Labs: 03/31/2020 Creatinine 1.00, BUN 15, Potassium 3.5, Sodium 139, GFR >60 01/03/2020 Creatinine 1.08, BUN 15, Potassium 3.9, Sodium 135, GFR 69-80 07/18/2019 Creatinine 1.02, BUN 15, Potassium 3.5, 137, GFR 78.22  Recommendations:  No changes and encouraged to call if experiencing any fluid symptoms.  Follow-up plan: ICM clinic phone appointment on1/09/2020. 91 day device clinic remote transmission2/14/2022.   EP/Cardiology Office Visits:Advised he is due to schedule office appointment with Dr.Nelson and encouraged to call the office.  12/28/2020 with Dr Rayann Heman.   Copy of ICM check sent to Dr.Allred.    3 month ICM trend: 06/03/2020    1 Year ICM trend:       Rosalene Billings, RN 06/07/2020 12:35 PM

## 2020-06-19 ENCOUNTER — Other Ambulatory Visit: Payer: Self-pay | Admitting: Internal Medicine

## 2020-06-20 ENCOUNTER — Other Ambulatory Visit: Payer: Self-pay | Admitting: Internal Medicine

## 2020-06-25 ENCOUNTER — Telehealth: Payer: Self-pay | Admitting: Pharmacist

## 2020-06-25 NOTE — Progress Notes (Signed)
Chronic Care Management Pharmacy Assistant   Name: Christopher [name]  MRN: [id number] DOB: [birth year]  Reason for Encounter: Initial Questions    PCP : Myrlene Broker, MD  Allergies:   Allergies  Allergen Reactions  . Codeine     Causes bad constipation  . Dust Mite Extract Cough  . Pollen Extract Cough    Medications: Outpatient Encounter Medications as of 06/25/2020  Medication Sig  . acetaminophen (TYLENOL) 500 MG tablet Take 500 mg by mouth every 6 (six) hours as needed for mild pain or headache.   Marland Kitchen amLODipine (NORVASC) 5 MG tablet Take 1 tablet (5 mg total) by mouth daily.  Marland Kitchen aspirin EC 81 MG tablet Take 81 mg by mouth daily.  Marland Kitchen atorvastatin (LIPITOR) 40 MG tablet Take 1 tablet (40 mg total) by mouth daily.  . cetirizine (ZYRTEC) 10 MG tablet Take 10 mg by mouth daily.  . cholecalciferol (VITAMIN D) 25 MCG (1000 UNIT) tablet Take 1 tablet by mouth daily.   . fenofibrate (TRICOR) 145 MG tablet TAKE 1 TABLET BY MOUTH  DAILY  . fluticasone (FLONASE) 50 MCG/ACT nasal spray USE 2 SPRAYS IN BOTH  NOSTRILS DAILY  . JARDIANCE 25 MG TABS tablet TAKE 1 TABLET BY MOUTH  DAILY BEFORE BREAKFAST  . metFORMIN (GLUCOPHAGE) 1000 MG tablet Take 1 tablet (1,000 mg total) by mouth 2 (two) times daily with a meal.  . metoprolol succinate (TOPROL-XL) 100 MG 24 hr tablet TAKE 1 TABLET BY MOUTH  DAILY OR IMMEDIATELY  FOLLOWING A MEAL (Patient taking differently: Take 100 mg by mouth every evening. TAKE 1 TABLET BY MOUTH  DAILY OR IMMEDIATELY  FOLLOWING A MEAL)  . ramipril (ALTACE) 10 MG capsule Take 1 capsule (10 mg total) by mouth daily.  . tamsulosin (FLOMAX) 0.4 MG CAPS capsule Take 1 capsule (0.4 mg total) by mouth daily.   No facility-administered encounter medications on file as of 06/25/2020.    Current Diagnosis: Patient Active Problem List   Diagnosis Date Noted  . Ventricular tachycardia (HCC) 01/03/2020  . Nonischemic cardiomyopathy (HCC) 09/27/2017  . Heart valve  disease 09/27/2017  . History of cardiac arrest 09/27/2017  . Diabetes mellitus type 2 with complications (HCC) 05/14/2017  . Alcohol use 05/05/2017  . CAD (coronary artery disease) 05/05/2017  . OSA (obstructive sleep apnea) 05/05/2017  . History of repair of mitral valve 05/05/2017  . Benign prostatic hyperplasia 05/16/2015  . Routine general medical examination at a health care facility 11/13/2014  . Chronic systolic CHF (congestive heart failure) (HCC) 10/25/2012  . Atrial fibrillation (HCC) 10/25/2012  . Hyperlipidemia associated with type 2 diabetes mellitus (HCC) 09/04/2012  . Severe mitral regurgitation   . Coronary artery disease 08/12/2012  . Essential hypertension 09/27/2008  . SLEEP APNEA 09/27/2008    Goals Addressed   None     Follow-Up:  Pharmacist Review   Have you seen any other providers since your last visit? The patient states that he has not seen any other provider since he last saw Dr. Okey Dupre  Any changes in your medications or health? The patient states that he was taking Jardiance but is no longer taking because of the cost of medications to high over $400.  Any side effects from any medications? The patient states that he is not having any other side affects from medications  Do you have an symptoms or problems not managed by your medications? The patient states that a few months ago he got really dizzy and  had to call the ambulance not sur what caused the dizziness  Any concerns about your health right now? The patient states that he has no concerns about his health at this time  Has your provider asked that you check blood pressure, blood sugar, or follow special diet at home? The patient states that he gets his blood pressure and blood sugar check at the doctors office when he goes  Do you get any type of exercise on a regular basis? The patient stated that he does go [facility] at least four times a week  Can you think of a goal you would like to  reach for your health? The patient states that he does not have a goal for his health at this time  Do you have any problems getting your medications? The patient states that he has not problems with getting his medications from the pharmacy   Is there anything that you would like to discuss during the appointment? The patient states that there is nothing at this time he can think of to discuss  Please bring medications and supplements to appointment   Wendy Poet, [place]

## 2020-06-26 ENCOUNTER — Ambulatory Visit: Payer: Medicare Other | Admitting: Pharmacist

## 2020-06-26 ENCOUNTER — Other Ambulatory Visit: Payer: Self-pay

## 2020-06-26 DIAGNOSIS — E118 Type 2 diabetes mellitus with unspecified complications: Secondary | ICD-10-CM

## 2020-06-26 DIAGNOSIS — I10 Essential (primary) hypertension: Secondary | ICD-10-CM

## 2020-06-26 DIAGNOSIS — E785 Hyperlipidemia, unspecified: Secondary | ICD-10-CM

## 2020-06-26 NOTE — Chronic Care Management (AMB) (Signed)
Chronic Care Management Pharmacy  Name: Christopher [name]  MRN: [id number] DOB: [birth year]   Chief Complaint/ HPI  Christopher [name],  69 y.o. , male presents for his Initial CCM visit with the clinical pharmacist via telephone due to COVID-19 Pandemic.  PCP : Hoyt Koch, MD Patient Care Team: Hoyt Koch, MD as PCP - General (Internal Medicine) [name], MD as PCP - Cardiology (Cardiology) Thompson Grayer, MD as PCP - Electrophysiology (Cardiology) [name], MD as Attending Physician (Cardiology) [name], MD as Attending Physician (Cardiothoracic Surgery) Deneise Lever, MD as Attending Physician (Pulmonary Disease) Sherren Mocha, MD as Attending Physician (Cardiology) Juanita Craver, MD as Consulting Physician (Gastroenterology) Thompson Grayer, MD (Cardiology) Charlton Haws, [facility] as Pharmacist (Pharmacist)  Patient's chronic conditions include: Hypertension, Hyperlipidemia, Diabetes, Atrial Fibrillation, Heart Failure, Coronary Artery Disease and BPH, Hx cardiac arrest s/p ICD  Office Visits: 05/16/20 Dr Sharlet Salina OV: chronic f/u, jardiance expensive. A1c down to 6.3%, advised to stop Jardiance and monitor A1c  01/16/20 Dr Sharlet Salina OV: recent Vfib and ICD shock at home, advised not to drive for 6 months. A1c 11.2, increase metformin to 1000 mg BID and add Jardiance 25 mg, f/u 3 months.  Consult Visit: 03/31/20 ED visit: near syncope, suspect dehydration/orthostatic cause. CT negative for central cause.   02/20/20 Dr Rayann Heman (cardiology): f/u for CHF, ICD.  Subjective: Patient lives alone, girlfriend lives nearby.  Objective: Allergies  Allergen Reactions  . Codeine     Causes bad constipation  . Dust Mite Extract Cough  . Pollen Extract Cough   Medications: Outpatient Encounter Medications as of 06/26/2020  Medication Sig  . acetaminophen (TYLENOL) 500 MG tablet Take 500 mg by mouth every 6 (six) hours as needed for mild  pain or headache.   Marland Kitchen amLODipine (NORVASC) 5 MG tablet Take 1 tablet (5 mg total) by mouth daily.  Marland Kitchen aspirin EC 81 MG tablet Take 81 mg by mouth daily.  Marland Kitchen atorvastatin (LIPITOR) 40 MG tablet Take 1 tablet (40 mg total) by mouth daily.  . cetirizine (ZYRTEC) 10 MG tablet Take 10 mg by mouth daily.  . cholecalciferol (VITAMIN D) 25 MCG (1000 UNIT) tablet Take 1 tablet by mouth daily.   . fenofibrate (TRICOR) 145 MG tablet TAKE 1 TABLET BY MOUTH  DAILY  . fluticasone (FLONASE) 50 MCG/ACT nasal spray USE 2 SPRAYS IN BOTH  NOSTRILS DAILY  . metFORMIN (GLUCOPHAGE) 1000 MG tablet Take 1 tablet (1,000 mg total) by mouth 2 (two) times daily with a meal.  . metoprolol succinate (TOPROL-XL) 100 MG 24 hr tablet TAKE 1 TABLET BY MOUTH  DAILY OR IMMEDIATELY  FOLLOWING A MEAL (Patient taking differently: Take 100 mg by mouth every evening. TAKE 1 TABLET BY MOUTH  DAILY OR IMMEDIATELY  FOLLOWING A MEAL)  . ramipril (ALTACE) 10 MG capsule Take 1 capsule (10 mg total) by mouth daily.  . tamsulosin (FLOMAX) 0.4 MG CAPS capsule Take 1 capsule (0.4 mg total) by mouth daily.  . [DISCONTINUED] JARDIANCE 25 MG TABS tablet TAKE 1 TABLET BY MOUTH  DAILY BEFORE BREAKFAST (Patient not taking: Reported on 06/26/2020)   No facility-administered encounter medications on file as of 06/26/2020.    Wt Readings from Last 3 Encounters:  05/16/20 229 lb (103.9 kg)  01/16/20 228 lb (103.4 kg)  01/03/20 227 lb (103 kg)   Lab Results  Component Value Date   CREATININE 1.00 05/16/2020   BUN 20 05/16/2020   GFR 76.13 05/16/2020  GFRNONAA >60 03/31/2020   GFRAA >60 03/31/2020   NA 138 05/16/2020   K 3.3 (L) 05/16/2020   CALCIUM 9.4 05/16/2020   CO2 28 05/16/2020    Current Diagnosis/Assessment:  SDOH Interventions   Flowsheet Row Most Recent Value  SDOH Interventions   Financial Strain Interventions Intervention Not Indicated      Goals Addressed            This Visit's Progress   . Pharmacy Care Plan        CARE PLAN ENTRY (see longitudinal plan of care for additional care plan information)  Current Barriers:  . Chronic Disease Management support, education, and care coordination needs related to Hypertension, Hyperlipidemia, and Diabetes   Hypertension / Heart Failure BP Readings from Last 3 Encounters:  05/16/20 138/78  03/31/20 (!) 169/78  01/16/20 134/82 .  Pharmacist Clinical Goal(s): o Over the next 120 days, patient will work with PharmD and providers to maintain BP goal <130/80 . Current regimen:  o Amlodipine 5 mg daily o Metoprolol succinate 100 mg daily o Ramipril 10 mg daily . Interventions: o Discussed BP goals and benefits of medications for prevention of heart attack / stroke . Patient self care activities - Over the next 120 days, patient will: o Check BP weekly, document, and provide at future appointments o Ensure daily salt intake < 2300 mg/day  Hyperlipidemia / CAD Lab Results  Component Value Date/Time   LDLCALC 52 04/17/2019 07:53 AM   LDLDIRECT 52.0 07/18/2019 02:54 PM .  Pharmacist Clinical Goal(s): o Over the next 120 days, patient will work with PharmD and providers to maintain LDL goal < 70 . Current regimen:  o Atorvastatin 40 mg daily o Fenofibrate 145 mg daily . Interventions: o Discussed cholesterol goals and benefits of medications for prevention of heart attack / stroke o May consider stopping fenofibrate if triglycerides improve with improved diabetes control . Patient self care activities - Over the next 120 days, patient will: o Continue current medications  Diabetes Lab Results  Component Value Date/Time   HGBA1C 6.3 05/16/2020 03:53 PM   HGBA1C 11.3 (A) 01/16/2020 01:18 PM   HGBA1C 9.7 (H) 07/18/2019 02:54 PM .  Pharmacist Clinical Goal(s): o Over the next 120 days, patient will work with PharmD and providers to maintain A1c goal <7% . Current regimen:  o Metformin 1000 mg twice a day . Interventions: o Discussed importance of  maintaining sugars at goal to prevent complications of diabetes including kidney damage, retinal damage, and cardiovascular disease . Patient self care activities - Over the next 120 days, patient will: o Continue diet and exercise routine to maintain A1c at goal  Medication management . Pharmacist Clinical Goal(s): o Over the next 120 days, patient will work with PharmD and providers to maintain optimal medication adherence . Current pharmacy: [facility] . Interventions o Comprehensive medication review performed. o Continue current medication management strategy . Patient self care activities - Over the next 120 days, patient will: o Focus on medication adherence by pill box o Take medications as prescribed o Report any questions or concerns to PharmD and/or provider(s)  Initial goal documentation       Heart Failure   Type: Diastolic (grade II dysfunction), mild LVH  Last ejection fraction: 55-60% (01/26/2020) NYHA Class: I (no actitivty limitation)  BP goal is:  <130/80 BP Readings from Last 3 Encounters:  05/16/20 138/78  03/31/20 (!) 169/78  01/16/20 134/82   Patient checks BP at home infrequently  Patient home BP readings are ranging: 120-130s/80s  Patient has failed these meds in past: n/a  Patient is currently controlled on the following medications:  . Amlodipine 5 mg daily AM . Metoprolol succinate 100 mg daily AM . Ramipril 10 mg daily AM  We discussed diet and exercise extensively - pt goes to gym 3-4 days per week, walks >10-20 miles per week. Uses weight machines as well. -discussed indication and benefits of medications; pt reports episode of extreme dizziness a few months ago that caused him to go to ED, symptoms had resolved by the time he got there and etiology event was unclear - dehydration/orthastasis most likely per ED notes. Discussed importance of adequate hydration and changing position slowly to avoid orthostasis  Plan  Continue  current medications and control with diet and exercise   AFIB   Patient is currently rate controlled. Office heart rates are  Pulse Readings from Last 3 Encounters:  05/16/20 72  03/31/20 65  01/16/20 72   CHA2DS2-VASc Score = 5  The patient's score is based upon: CHF History: Yes HTN History: Yes Diabetes History: Yes Stroke History: No Vascular Disease History: Yes Age Score: 1 Gender Score: 0  Patient has failed these meds in past: Eliquis, Xarelto, warfarin Patient is currently controlled on the following medications:  Marland Kitchen Metoprolol succinate 100 mg daily AM . Aspirin 81 mg daily AM  We discussed:  Benefits of medications; importance of aspirin given cardiac history; he is not on anticoagulation d/t hx of thigh hematoma while on Xarelto  Plan  Continue current medications  Hyperlipidemia / CAD   LDL goal < 70 CAD, NICM  Last lipids Lab Results  Component Value Date   CHOL 119 07/18/2019   HDL 32.60 (L) 07/18/2019   LDLCALC 52 04/17/2019   LDLDIRECT 52.0 07/18/2019   TRIG 366.0 (H) 07/18/2019   CHOLHDL 4 07/18/2019   Hepatic Function Latest Ref Rng & Units 05/16/2020 07/18/2019 04/17/2019  Total Protein 6.0 - 8.3 g/dL 6.9 6.9 6.8  Albumin 3.5 - 5.2 g/dL 4.3 4.2 4.4  AST 0 - 37 U/L _0 ALT 0 - 53 U/L _1 Alk Phosphatase 39 - 117 U/L 41 65 86  Total Bilirubin 0.2 - 1.2 mg/dL 0.6 0.6 0.6  Bilirubin, Direct 0.00 - 0.40 mg/dL - - 0.19    The ASCVD Risk score (Cecil., et al., 2013) failed to calculate for the following reasons:   The valid total cholesterol range is 130 to 320 mg/dL   Patient has failed these meds in past: n/a Patient is currently controlled on the following medications:  . Atorvastatin 40 mg daily PM . Fenofibrate 145 mg daily AM  We discussed:  diet and exercise extensively; Cholesterol goals; benefits of statin for ASCVD risk reduction; indication for fenofibrate for triglycerides -of note TRIG were 274 when fenofibrate  was started in 12/2018. Recent trig elevations are likely due to uncontrolled DM and may be significantly improved with recent improvement in A1c; may consider d/c fenofibrate in future if triglycerides are improved  Plan  Continue current medications  Consider d/c fenofibrate if triglycerides improve with improved DM control  Diabetes   A1c goal <7%  Recent Relevant Labs: Lab Results  Component Value Date/Time   HGBA1C 6.3 05/16/2020 03:53 PM   HGBA1C 11.3 (A) 01/16/2020 01:18 PM   HGBA1C 9.7 (H) 07/18/2019 02:54 PM   FRUCTOSAMINE 246 01/13/2018 10:29 AM   GFR 76.13 05/16/2020 03:53 PM  GFR 72.22 07/18/2019 02:54 PM   MICROALBUR 1.8 07/18/2019 03:00 PM    Last diabetic Eye exam:  Lab Results  Component Value Date/Time   HMDIABEYEEXA No Retinopathy 04/16/2020 12:00 AM    Last diabetic Foot exam: No results found for: HMDIABFOOTEX   Checking BG: Never  Patient has failed these meds in past: Jardiance 25 mg (cost $400) Patient is currently controlled on the following medications: Marland Kitchen Metformin 1000 mg BID  We discussed: diet and exercise extensively - cut back on carbs on sweets; A1c and BG goals; most recent A1c was while pt was taking both metformin and Jardiance, expect some rise in A1c with d/c of Jardiance but with lifestyle modifications pt may be able to keep A1c below goal of 7%  Plan  Continue current medications and control with diet and exercise   BPH   Lab Results  Component Value Date/Time   PSA 0.99 05/03/2013 09:59 AM   Patient has failed these meds in past: n/a Patient is currently controlled on the following medications:  . Tamsulosin 0.4 mg daily PM  We discussed: indication for tamsulosin; pt denies urinary issues  Plan  Continue current medications  Allergic rhinitis   Patient has failed these meds in past: n/a Patient is currently controlled on the following medications:  . Cetirizine 10 mg daily . Fluticasone nasal spray daily  We  discussed:  Hx of recurrent sinus infections, since taking these medications he has not had sinus issues  Plan  Continue current medications  Vaccines   Reviewed and discussed patient's vaccination history.    Immunization History  Administered Date(s) Administered  . Fluad Quad(high Dose 65+) 03/04/2019, 05/16/2020  . Influenza, High Dose Seasonal PF 04/15/2017, 04/22/2018  . Influenza,inj,Quad PF,6+ Mos 04/09/2014, 05/11/2016  . Influenza-Unspecified 04/26/2013, 05/09/2015  . PFIZER SARS-COV-2 Vaccination 08/18/2019, 09/10/2019, 05/18/2020  . Pneumococcal Conjugate-13 11/13/2014  . Pneumococcal Polysaccharide-23 05/11/2016  . Pneumococcal-Unspecified 02/24/2010  . Tdap 08/20/2010  . Zoster 08/06/2009  . Zoster Recombinat (Shingrix) 04/26/2017    Plan  Vaccines up to date  Health Maintenance   Patient is currently controlled on the following medications:  Marland Kitchen Vitamin D 1000 IU daily . Tylenol 500 mg PRN . Restore eye supplement  We discussed:  Patient is satisfied with current regimen and denies issues  Plan  Continue current medications  Medication Management   Patient's preferred pharmacy is:  [facility], [address], [address] [place], [place] [address] Phone: [phone] Fax: [phone]  Uses pill box? Yes Pt endorses 100% compliance  We discussed: Current pharmacy is preferred with insurance plan and patient is satisfied with pharmacy services  Plan  Continue current medication management strategy    Follow up: 4 month phone visit  Charlene Brooke, PharmD, BCACP Clinical Pharmacist [facility] [phone]

## 2020-06-27 NOTE — Patient Instructions (Addendum)
Visit Information  Phone number for Pharmacist: [phone]  Thank you for meeting with me to discuss your medications! I look forward to working with you to achieve your health care goals. Below is a summary of what we talked about during the visit:  Goals Addressed            This Visit's Progress   . Pharmacy Care Plan       CARE PLAN ENTRY (see longitudinal plan of care for additional care plan information)  Current Barriers:  . Chronic Disease Management support, education, and care coordination needs related to Hypertension, Hyperlipidemia, and Diabetes   Hypertension / Heart Failure BP Readings from Last 3 Encounters:  05/16/20 138/78  03/31/20 (!) 169/78  01/16/20 134/82 .  Pharmacist Clinical Goal(s): o Over the next 120 days, patient will work with PharmD and providers to maintain BP goal <130/80 . Current regimen:  o Amlodipine 5 mg daily o Metoprolol succinate 100 mg daily o Ramipril 10 mg daily . Interventions: o Discussed BP goals and benefits of medications for prevention of heart attack / stroke . Patient self care activities - Over the next 120 days, patient will: o Check BP weekly, document, and provide at future appointments o Ensure daily salt intake < 2300 mg/day  Hyperlipidemia / CAD Lab Results  Component Value Date/Time   LDLCALC 52 04/17/2019 07:53 AM   LDLDIRECT 52.0 07/18/2019 02:54 PM .  Pharmacist Clinical Goal(s): o Over the next 120 days, patient will work with PharmD and providers to maintain LDL goal < 70 . Current regimen:  o Atorvastatin 40 mg daily o Fenofibrate 145 mg daily . Interventions: o Discussed cholesterol goals and benefits of medications for prevention of heart attack / stroke o May consider stopping fenofibrate if triglycerides improve with improved diabetes control . Patient self care activities - Over the next 120 days, patient will: o Continue current medications  Diabetes Lab Results  Component Value Date/Time    HGBA1C 6.3 05/16/2020 03:53 PM   HGBA1C 11.3 (A) 01/16/2020 01:18 PM   HGBA1C 9.7 (H) 07/18/2019 02:54 PM .  Pharmacist Clinical Goal(s): o Over the next 120 days, patient will work with PharmD and providers to maintain A1c goal <7% . Current regimen:  o Metformin 1000 mg twice a day . Interventions: o Discussed importance of maintaining sugars at goal to prevent complications of diabetes including kidney damage, retinal damage, and cardiovascular disease . Patient self care activities - Over the next 120 days, patient will: o Continue diet and exercise routine to maintain A1c at goal  Medication management . Pharmacist Clinical Goal(s): o Over the next 120 days, patient will work with PharmD and providers to maintain optimal medication adherence . Current pharmacy: [facility] . Interventions o Comprehensive medication review performed. o Continue current medication management strategy . Patient self care activities - Over the next 120 days, patient will: o Focus on medication adherence by pill box o Take medications as prescribed o Report any questions or concerns to PharmD and/or provider(s)  Initial goal documentation      Christopher [name] was given information about Chronic Care Management services today including:  1. CCM service includes personalized support from designated clinical staff supervised by his physician, including individualized plan of care and coordination with other care providers 2. 24/7 contact phone numbers for assistance for urgent and routine care needs. 3. Standard insurance, coinsurance, copays and deductibles apply for chronic care management only during months in which we provide  at least 20 minutes of these services. Most insurances cover these services at 100%, however patients may be responsible for any copay, coinsurance and/or deductible if applicable. This service may help you avoid the need for more expensive face-to-face services. 4. Only  one practitioner may furnish and bill the service in a calendar month. 5. The patient may stop CCM services at any time (effective at the end of the month) by phone call to the office staff.  Patient agreed to services and verbal consent obtained.   The patient verbalized understanding of instructions, educational materials, and care plan provided today and agreed to receive a mailed copy of patient instructions, educational materials, and care plan.  Telephone follow up appointment with pharmacy team member scheduled for: 4 months  Charlene Brooke, PharmD, BCACP Clinical Pharmacist [facility] [phone]  Carbohydrate Counting for Diabetes Mellitus, Adult  Carbohydrate counting is a method of keeping track of how many carbohydrates you eat. Eating carbohydrates naturally increases the amount of sugar (glucose) in the blood. Counting how many carbohydrates you eat helps keep your blood glucose within normal limits, which helps you manage your diabetes (diabetes mellitus). It is important to know how many carbohydrates you can safely have in each meal. This is different for every person. A diet and nutrition specialist (registered dietitian) can help you make a meal plan and calculate how many carbohydrates you should have at each meal and snack. Carbohydrates are found in the following foods:  Grains, such as breads and cereals.  Dried beans and soy products.  Starchy vegetables, such as potatoes, peas, and corn.  Fruit and fruit juices.  Milk and yogurt.  Sweets and snack foods, such as cake, cookies, candy, chips, and soft drinks. How do I count carbohydrates? There are two ways to count carbohydrates in food. You can use either of the methods or a combination of both. Reading "Nutrition Facts" on packaged food The "Nutrition Facts" list is included on the labels of almost all packaged foods and beverages in the [place] It includes:  The serving  size.  Information about nutrients in each serving, including the grams (g) of carbohydrate per serving. To use the "Nutrition Facts":  Decide how many servings you will have.  Multiply the number of servings by the number of carbohydrates per serving.  The resulting number is the total amount of carbohydrates that you will be having. Learning standard serving sizes of other foods When you eat carbohydrate foods that are not packaged or do not include "Nutrition Facts" on the label, you need to measure the servings in order to count the amount of carbohydrates:  Measure the foods that you will eat with a food scale or measuring cup, if needed.  Decide how many standard-size servings you will eat.  Multiply the number of servings by 15. Most carbohydrate-rich foods have about 15 g of carbohydrates per serving. ? For example, if you eat 8 oz (170 g) of strawberries, you will have eaten 2 servings and 30 g of carbohydrates (2 servings x 15 g = 30 g).  For foods that have more than one food mixed, such as soups and casseroles, you must count the carbohydrates in each food that is included. The following list contains standard serving sizes of common carbohydrate-rich foods. Each of these servings has about 15 g of carbohydrates:   hamburger bun or  English muffin.   oz (15 mL) syrup.   oz (14 g) jelly.  1 slice  of bread.  1 six-inch tortilla.  3 oz (85 g) cooked rice or pasta.  4 oz (113 g) cooked dried beans.  4 oz (113 g) starchy vegetable, such as peas, corn, or potatoes.  4 oz (113 g) hot cereal.  4 oz (113 g) mashed potatoes or  of a large baked potato.  4 oz (113 g) canned or frozen fruit.  4 oz (120 mL) fruit juice.  4-6 crackers.  6 chicken nuggets.  6 oz (170 g) unsweetened dry cereal.  6 oz (170 g) plain fat-free yogurt or yogurt sweetened with artificial sweeteners.  8 oz (240 mL) milk.  8 oz (170 g) fresh fruit or one small piece of fruit.  24  oz (680 g) popped popcorn. Example of carbohydrate counting Sample meal  3 oz (85 g) chicken breast.  6 oz (170 g) brown rice.  4 oz (113 g) corn.  8 oz (240 mL) milk.  8 oz (170 g) strawberries with sugar-free whipped topping. Carbohydrate calculation 1. Identify the foods that contain carbohydrates: ? Rice. ? Corn. ? Milk. ? Strawberries. 2. Calculate how many servings you have of each food: ? 2 servings rice. ? 1 serving corn. ? 1 serving milk. ? 1 serving strawberries. 3. Multiply each number of servings by 15 g: ? 2 servings rice x 15 g = 30 g. ? 1 serving corn x 15 g = 15 g. ? 1 serving milk x 15 g = 15 g. ? 1 serving strawberries x 15 g = 15 g. 4. Add together all of the amounts to find the total grams of carbohydrates eaten: ? 30 g + 15 g + 15 g + 15 g = 75 g of carbohydrates total. Summary  Carbohydrate counting is a method of keeping track of how many carbohydrates you eat.  Eating carbohydrates naturally increases the amount of sugar (glucose) in the blood.  Counting how many carbohydrates you eat helps keep your blood glucose within normal limits, which helps you manage your diabetes.  A diet and nutrition specialist (registered dietitian) can help you make a meal plan and calculate how many carbohydrates you should have at each meal and snack. This information is not intended to replace advice given to you by your health care provider. Make sure you discuss any questions you have with your health care provider. Document Revised: 01/14/2017 Document Reviewed: 12/04/2015 Elsevier Patient Education  [place].

## 2020-07-08 ENCOUNTER — Ambulatory Visit (INDEPENDENT_AMBULATORY_CARE_PROVIDER_SITE_OTHER): Payer: Medicare Other

## 2020-07-08 DIAGNOSIS — I5022 Chronic systolic (congestive) heart failure: Secondary | ICD-10-CM

## 2020-07-08 DIAGNOSIS — Z95810 Presence of automatic (implantable) cardiac defibrillator: Secondary | ICD-10-CM | POA: Diagnosis not present

## 2020-07-10 ENCOUNTER — Telehealth: Payer: Self-pay

## 2020-07-10 NOTE — Progress Notes (Signed)
EPIC Encounter for ICM Monitoring  Patient Name: Christopher [name] is a 70 y.o. male Date: 07/10/2020 Primary Care Physican: Myrlene Broker, MD Primary Cardiologist:Nelson Electrophysiologist:Allred 12/3/2021Weight: 231lbs  Attempted call to patient and unable to reach.  Left detailed message per DPR regarding transmission. Transmission reviewed.   Optivol thoracic impedance normal.  No diuretic  Labs: 03/31/2020 Creatinine 1.00, BUN 15, Potassium 3.5, Sodium 139, GFR >60 01/03/2020 Creatinine 1.08, BUN 15, Potassium 3.9, Sodium 135, GFR 69-80 07/18/2019 Creatinine 1.02, BUN 15, Potassium 3.5, 137, GFR 78.22  Recommendations:Left voice mail with ICM number and encouraged to call if experiencing any fluid symptoms.  Follow-up plan: ICM clinic phone appointment on2/02/2021. 91 day device clinic remote transmission2/14/2022.   EP/Cardiology Office Visits:Recall 12/28/2020 with Dr Johney Frame.   Patient aware to call Dr Lindaann Slough office for appointment (last visit 07/2019).   Copy of ICM check sent to Dr.Allred.  3 month ICM trend: 07/08/2020.    1 Year ICM trend:       Karie Soda, RN 07/10/2020 12:12 PM

## 2020-07-10 NOTE — Telephone Encounter (Signed)
Remote ICM transmission received.  Attempted call to patient regarding ICM remote transmission and left detailed message per DPR.  Advised to return call for any fluid symptoms or questions. Next ICM remote transmission scheduled 08/13/2020.     

## 2020-07-20 ENCOUNTER — Other Ambulatory Visit: Payer: Self-pay | Admitting: Internal Medicine

## 2020-08-13 ENCOUNTER — Ambulatory Visit (INDEPENDENT_AMBULATORY_CARE_PROVIDER_SITE_OTHER): Payer: Medicare Other

## 2020-08-13 DIAGNOSIS — I5022 Chronic systolic (congestive) heart failure: Secondary | ICD-10-CM

## 2020-08-13 DIAGNOSIS — Z95810 Presence of automatic (implantable) cardiac defibrillator: Secondary | ICD-10-CM | POA: Diagnosis not present

## 2020-08-19 ENCOUNTER — Ambulatory Visit (INDEPENDENT_AMBULATORY_CARE_PROVIDER_SITE_OTHER): Payer: Medicare Other

## 2020-08-19 DIAGNOSIS — I480 Paroxysmal atrial fibrillation: Secondary | ICD-10-CM | POA: Diagnosis not present

## 2020-08-19 LAB — CUP PACEART REMOTE DEVICE CHECK
Battery Remaining Longevity: 97 mo
Battery Voltage: 2.99 V
Brady Statistic RV Percent Paced: 0.08 %
Date Time Interrogation Session: 20220214022823
HighPow Impedance: 74 Ohm
Implantable Lead Implant Date: 20181102
Implantable Lead Location: 753860
Implantable Pulse Generator Implant Date: 20181102
Lead Channel Impedance Value: 437 Ohm
Lead Channel Impedance Value: 513 Ohm
Lead Channel Pacing Threshold Amplitude: 0.5 V
Lead Channel Pacing Threshold Pulse Width: 0.4 ms
Lead Channel Sensing Intrinsic Amplitude: 5.875 mV
Lead Channel Sensing Intrinsic Amplitude: 5.875 mV
Lead Channel Setting Pacing Amplitude: 2 V
Lead Channel Setting Pacing Pulse Width: 0.4 ms
Lead Channel Setting Sensing Sensitivity: 0.3 mV

## 2020-08-20 NOTE — Progress Notes (Signed)
EPIC Encounter for ICM Monitoring  Patient Name: Christopher [name] is a 70 y.o. male Date: 08/20/2020 Primary Care Physican: Hoyt Koch, MD Primary Cardiologist:Nelson Electrophysiologist:Allred 12/3/2021Weight: 231lbs  Transmission reviewed.   Optivol thoracic impedance normal.  No diuretic  Labs: 05/16/2020 Creatinine 1.00, BUN 20, Potassium 3.3, Sodium 138, GFR 7613 03/31/2020 Creatinine 1.00, BUN 15, Potassium 3.5, Sodium 139, GFR >60 01/03/2020 Creatinine 1.08, BUN 15, Potassium 3.9, Sodium 135, GFR 69-80 07/18/2019 Creatinine 1.02, BUN 15, Potassium 3.5, 137, GFR 78.22  Recommendations: No changes.  Follow-up plan: ICM clinic phone appointment on3/14/2022. 91 day device clinic remote transmission5/16/2022.   EP/Cardiology Office Visits:Recall6/25/2022 with Dr Rayann Heman.   Patient aware to call Dr Francesca Oman office for appointment (last visit 07/2019).   Copy of ICM check sent to Dr.Allred.   3 month ICM trend: 08/19/2020.    1 Year ICM trend:       Rosalene Billings, RN 08/20/2020 11:40 AM

## 2020-08-26 NOTE — Progress Notes (Signed)
Remote ICD transmission.   

## 2020-09-03 ENCOUNTER — Other Ambulatory Visit: Payer: Self-pay | Admitting: Internal Medicine

## 2020-09-03 DIAGNOSIS — I480 Paroxysmal atrial fibrillation: Secondary | ICD-10-CM

## 2020-09-05 ENCOUNTER — Ambulatory Visit (INDEPENDENT_AMBULATORY_CARE_PROVIDER_SITE_OTHER): Payer: Medicare Other

## 2020-09-05 DIAGNOSIS — Z0000 Encounter for general adult medical examination without abnormal findings: Secondary | ICD-10-CM

## 2020-09-05 NOTE — Progress Notes (Signed)
I connected with Christopher [name] today by telephone and verified that I am speaking with the correct person using two identifiers. Location patient: home Location provider: work Persons participating in the virtual visit: Christopher [name] and Lisette Abu, LPN.  I discussed the limitations, risks, security and privacy concerns of performing an evaluation and management service by telephone and the availability of in person appointments. I also discussed with the patient that there may be a patient responsible charge related to this service. The patient expressed understanding and verbally consented to this telephonic visit.    Interactive audio and video telecommunications were attempted between this provider and patient, however failed, due to patient having technical difficulties OR patient did not have access to video capability.  We continued and completed visit with audio only.  Some vital signs may be absent or patient reported.   Time Spent with patient on telephone encounter: 30 minutes  Subjective:   Christopher [name] is a 70 y.o. male who presents for Medicare Annual/Subsequent preventive examination.  Review of Systems    No ROS. Medicare Wellness Visit. Additional risk factors are reflected in social history. Cardiac Risk Factors include: advanced age (>26men, >72 women);diabetes mellitus;dyslipidemia;family history of premature cardiovascular disease;hypertension;male gender     Objective:    There were no vitals filed for this visit. There is no height or weight on file to calculate BMI.  Advanced Directives 09/05/2020 05/24/2019 05/07/2017 05/01/2016 03/31/2013 09/29/2012 09/23/2012  Does Patient Have a Medical Advance Directive? No No No - Patient does not have advance directive Patient does not have advance directive Patient does not have advance directive  Does patient want to make changes to medical advance directive? - - - No - Patient declined - - -  Would patient like  information on creating a medical advance directive? No - Patient declined Yes (ED - Information included in AVS) No - Patient declined - - - -  Pre-existing out of facility DNR order (yellow form or pink MOST form) - - - - No No -    Current Medications (verified) Outpatient Encounter Medications as of 09/05/2020  Medication Sig  . acetaminophen (TYLENOL) 500 MG tablet Take 500 mg by mouth every 6 (six) hours as needed for mild pain or headache.   Marland Kitchen amLODipine (NORVASC) 5 MG tablet TAKE 1 TABLET BY MOUTH  DAILY  . aspirin EC 81 MG tablet Take 81 mg by mouth daily.  Marland Kitchen atorvastatin (LIPITOR) 40 MG tablet TAKE 1 TABLET BY MOUTH  DAILY  . cetirizine (ZYRTEC) 10 MG tablet Take 10 mg by mouth daily.  . cholecalciferol (VITAMIN D) 25 MCG (1000 UNIT) tablet Take 1 tablet by mouth daily.   . fenofibrate (TRICOR) 145 MG tablet TAKE 1 TABLET BY MOUTH  DAILY  . fluticasone (FLONASE) 50 MCG/ACT nasal spray USE 2 SPRAYS IN BOTH  NOSTRILS DAILY  . metFORMIN (GLUCOPHAGE) 1000 MG tablet Take 1 tablet (1,000 mg total) by mouth 2 (two) times daily with a meal.  . metoprolol succinate (TOPROL-XL) 100 MG 24 hr tablet TAKE 1 TABLET BY MOUTH  DAILY OR IMMEDIATELY  FOLLOWING A MEAL (Patient taking differently: Take 100 mg by mouth every evening. TAKE 1 TABLET BY MOUTH  DAILY OR IMMEDIATELY  FOLLOWING A MEAL)  . ramipril (ALTACE) 10 MG capsule TAKE 1 CAPSULE BY MOUTH  DAILY  . tamsulosin (FLOMAX) 0.4 MG CAPS capsule TAKE 1 CAPSULE BY MOUTH  DAILY   No facility-administered encounter medications on file as of  09/05/2020.    Allergies (verified) Codeine, Dust mite extract, and Pollen extract   History: Past Medical History:  Diagnosis Date  . Allergy   . Arthritis   . Atrial fibrillation ([place])    post op, intol of anticoag  . Cardiac arrest [facility])    a. s/p MDT single chamber ICD; 11-10-18- pt denies having a heart attack  . Chronic kidney disease    kidney stone  . Coronary artery disease    a. 2/7 Cath: LM  nl, LAD min irregs, LCX min irregs, RI 40, RCA 42m, EF 55-60% basal to mid inf HK, 3-4+ MR;  b. 08/25/2012 PCI of RCA with 4.0x15 Vision BMS  . Diabetes mellitus without complication ([place])   . GERD (gastroesophageal reflux disease)    hx  . Heart murmur   . Hyperlipidemia    on statin  . Hypertension   . 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 Severe mitral regurgitation    a. Mitral valve prolapse with flail segment of posterior leaflet and severe MR by TEE, remote h/o bacterial endocarditis   . Sleep apnea    NPSG 01/21/06- AHI 40.7/hr cpap  . Subacute bacterial endocarditis 03/22/2008   Strep viridans  . Ventricular fibrillation ([place]) 11/07/2019   appropriate shock (36J) for VF delivered   Past Surgical History:  Procedure Laterality Date  . AMPUTATION  07/28/2012   Procedure: AMPUTATION DIGIT;  Surgeon: Christopher Rhein, MD;  Location: [facility];  Service: Orthopedics;  Laterality: Right;  2nd toe  . CARDIAC CATHETERIZATION    . COLONOSCOPY    . EP IMPLANTABLE DEVICE N/A 05/01/2016   Procedure: Loop Recorder Removal;  Surgeon: Thompson Grayer, MD;  Location: [place] CV LAB;  Service: Cardiovascular;  Laterality: N/A;  . FOOT SURGERY     BILATERAL TOES  . ICD IMPLANT N/A 05/07/2017    Medtronic Visia AF MRI VR SureScan implanted by Dr Curt Bears following VF arrest  . Implantable loop recorder placement  03/31/13   MDT Linq implanted by Dr Rayann Heman to evaluate for further afib  . INTRAOPERATIVE TRANSESOPHAGEAL ECHOCARDIOGRAM N/A 09/27/2012   Procedure: INTRAOPERATIVE TRANSESOPHAGEAL ECHOCARDIOGRAM;  Surgeon: [name], MD;  Location: [place];  Service: Open Heart Surgery;  Laterality: N/A;  . LEFT AND RIGHT HEART CATHETERIZATION WITH CORONARY ANGIOGRAM N/A 08/12/2012   Procedure: LEFT AND RIGHT HEART CATHETERIZATION WITH CORONARY  ANGIOGRAM;  Surgeon: [name], MD;  Location: [facility] CATH LAB;  Service: Cardiovascular;  Laterality: N/A;  . LEFT HEART CATH AND CORONARY ANGIOGRAPHY N/A 05/06/2017   Procedure: LEFT HEART CATH AND CORONARY ANGIOGRAPHY;  Surgeon: [name], MD;  Location: [place] CV LAB;  Service: Cardiovascular;  Laterality: N/A;  . LOOP RECORDER IMPLANT N/A 03/31/2013   Procedure: LOOP RECORDER IMPLANT;  Surgeon: [name], MD;  Location: [place] CATH LAB;  Service: Cardiovascular;  Laterality: N/A;  . MITRAL VALVE REPAIR Right 09/27/2012   Procedure: MINIMALLY INVASIVE MITRAL VALVE REPAIR (MVR);  Surgeon: [name], MD;  Location: [place];  Service: Open Heart Surgery;  Laterality: Right;  Ultrasound guided  . PERCUTANEOUS CORONARY STENT INTERVENTION (PCI-S) N/A 08/25/2012   Procedure: PERCUTANEOUS CORONARY STENT INTERVENTION (PCI-S);  Surgeon: Sherren Mocha, MD;  Location: [facility] CATH LAB;  Service: Cardiovascular;  Laterality: N/A;  . SEPTOPLASTY     Dr. [name]  . TEE  WITHOUT CARDIOVERSION N/A 08/12/2012   Procedure: TRANSESOPHAGEAL ECHOCARDIOGRAM (TEE);  Surgeon: [name], MD;  Location: [place];  Service: Cardiovascular;  Laterality: N/A;  . TONSILLECTOMY    . UVULOPALATOPHARYNGOPLASTY     Family History  Problem Relation Age of Onset  . Heart disease Father   . Colon cancer Father 66  . Diabetes Father   . Renal cancer Father   . Diabetes Mother   . Stroke Mother   . Sudden death Brother   . Heart attack Brother   . Esophageal cancer Neg Hx   . Rectal cancer Neg Hx   . Stomach cancer Neg Hx    Social History   Socioeconomic History  . Marital status: Divorced    Spouse name: Not on file  . Number of children: Not on file  . Years of education: Not on file  . Highest education level: Not on file  Occupational History  . Occupation: Works at [employer] Retired  Tobacco Use  . Smoking status: Former Smoker    Packs/day: 2.50    Years: 5.00    Pack years: 12.50     Types: Cigarettes    Quit date: 07/06/1978    Years since quitting: 42.1  . Smokeless tobacco: Never Used  . Tobacco comment: social drinker  Vaping Use  . Vaping Use: Never used  Substance and Sexual Activity  . Alcohol use: Yes    Comment: OCC.  . Drug use: No  . Sexual activity: Yes  Other Topics Concern  . Not on file  Social History Narrative   Works at [employer] - plans to retire 07/2015 after 36 years.   Divorced, lives alone   Supportive g-friend (shirlee moore)         Social Determinants of Health   Financial Resource Strain: Low Risk   . Difficulty of Paying Living Expenses: Not hard at all  Food Insecurity: No Food Insecurity  . Worried About Charity fundraiser in the Last Year: Never true  . Ran Out of Food in the Last Year: Never true  Transportation Needs: No Transportation Needs  . Lack of Transportation (Medical): No  . Lack of Transportation (Non-Medical): No  Physical Activity: Sufficiently Active  . Days of Exercise per Week: 5 days  . Minutes of Exercise per Session: 30 min  Stress: No Stress Concern Present  . Feeling of Stress : Not at all  Social Connections: Moderately Integrated  . Frequency of Communication with Friends and Family: More than three times a week  . Frequency of Social Gatherings with Friends and Family: More than three times a week  . Attends Religious Services: More than 4 times per year  . Active Member of Clubs or Organizations: No  . Attends Archivist Meetings: More than 4 times per year  . Marital Status: Divorced    Tobacco Counseling Counseling given: Not Answered Comment: social drinker   Clinical Intake:  Pre-visit preparation completed: Yes  Pain : No/denies pain     Nutritional Risks: None Diabetes: Yes CBG done?: No Did pt. bring in CBG monitor from home?: No  How often do you need to have someone help you when you read instructions, pamphlets, or other written materials from your doctor or  pharmacy?: 1 - Never What is the last grade level you completed in school?: HSG; community college classes  Diabetic? yes  Interpreter Needed?: No  Information entered by :: Lisette Abu, LPN   Activities of Daily Living  In your present state of health, do you have any difficulty performing the following activities: 09/05/2020  Hearing? N  Vision? N  Difficulty concentrating or making decisions? N  Walking or climbing stairs? N  Dressing or bathing? N  Doing errands, shopping? N  Preparing Food and eating ? N  Using the Toilet? N  In the past six months, have you accidently leaked urine? N  Do you have problems with loss of bowel control? N  Managing your Medications? N  Managing your Finances? N  Housekeeping or managing your Housekeeping? N  Some recent data might be hidden    Patient Care Team: Hoyt Koch, MD as PCP - General (Internal Medicine) [name], MD as PCP - Cardiology (Cardiology) Thompson Grayer, MD as PCP - Electrophysiology (Cardiology) [name], MD as Attending Physician (Cardiology) [name], MD as Attending Physician (Cardiothoracic Surgery) Deneise Lever, MD as Attending Physician (Pulmonary Disease) Sherren Mocha, MD as Attending Physician (Cardiology) Juanita Craver, MD as Consulting Physician (Gastroenterology) Thompson Grayer, MD (Cardiology) Charlton Haws, [facility] as Pharmacist (Pharmacist)  Indicate any recent Medical Services you may have received from other than [facility] providers in the past year (date may be approximate).     Assessment:   This is a routine wellness examination for Joshuan.  Hearing/Vision screen No exam data present  Dietary issues and exercise activities discussed: Current Exercise Habits: Home exercise routine, Type of exercise: walking;Other - see comments (stationary bike), Time (Minutes): 30, Frequency (Times/Week): 5, Weekly Exercise (Minutes/Week): 150, Exercise limited by:  cardiac condition(s)  Goals    . Pharmacy Care Plan     CARE PLAN ENTRY (see longitudinal plan of care for additional care plan information)  Current Barriers:  . Chronic Disease Management support, education, and care coordination needs related to Hypertension, Hyperlipidemia, and Diabetes   Hypertension / Heart Failure BP Readings from Last 3 Encounters:  05/16/20 138/78  03/31/20 (!) 169/78  01/16/20 134/82 .  Pharmacist Clinical Goal(s): o Over the next 120 days, patient will work with PharmD and providers to maintain BP goal <130/80 . Current regimen:  o Amlodipine 5 mg daily o Metoprolol succinate 100 mg daily o Ramipril 10 mg daily . Interventions: o Discussed BP goals and benefits of medications for prevention of heart attack / stroke . Patient self care activities - Over the next 120 days, patient will: o Check BP weekly, document, and provide at future appointments o Ensure daily salt intake < 2300 mg/day  Hyperlipidemia / CAD Lab Results  Component Value Date/Time   LDLCALC 52 04/17/2019 07:53 AM   LDLDIRECT 52.0 07/18/2019 02:54 PM .  Pharmacist Clinical Goal(s): o Over the next 120 days, patient will work with PharmD and providers to maintain LDL goal < 70 . Current regimen:  o Atorvastatin 40 mg daily o Fenofibrate 145 mg daily . Interventions: o Discussed cholesterol goals and benefits of medications for prevention of heart attack / stroke o May consider stopping fenofibrate if triglycerides improve with improved diabetes control . Patient self care activities - Over the next 120 days, patient will: o Continue current medications  Diabetes Lab Results  Component Value Date/Time   HGBA1C 6.3 05/16/2020 03:53 PM   HGBA1C 11.3 (A) 01/16/2020 01:18 PM   HGBA1C 9.7 (H) 07/18/2019 02:54 PM .  Pharmacist Clinical Goal(s): o Over the next 120 days, patient will work with PharmD and providers to maintain A1c goal <7% . Current regimen:  o Metformin 1000 mg  twice a day . Interventions: o Discussed importance of maintaining sugars at goal to prevent complications of diabetes including kidney damage, retinal damage, and cardiovascular disease . Patient self care activities - Over the next 120 days, patient will: o Continue diet and exercise routine to maintain A1c at goal  Medication management . Pharmacist Clinical Goal(s): o Over the next 120 days, patient will work with PharmD and providers to maintain optimal medication adherence . Current pharmacy: [facility] . Interventions o Comprehensive medication review performed. o Continue current medication management strategy . Patient self care activities - Over the next 120 days, patient will: o Focus on medication adherence by pill box o Take medications as prescribed o Report any questions or concerns to PharmD and/or provider(s)  Initial goal documentation      Depression Screen PHQ 2/9 Scores 09/05/2020 09/05/2020 05/24/2019 05/19/2018 05/14/2017 05/16/2015 03/01/2015  PHQ - 2 Score 0 0 0 0 0 0 0    Fall Risk Fall Risk  09/05/2020 05/24/2019 05/19/2018 05/16/2015  Falls in the past year? 0 1 0 No  Number falls in past yr: 0 1 - -  Injury with Fall? 0 0 - -  Risk for fall due to : No Fall Risks Impaired balance/gait;History of fall(s) - -  Follow up Falls evaluation completed Falls prevention discussed - -    FALL RISK PREVENTION PERTAINING TO THE HOME:  Any stairs in or around the home? No  If so, are there any without handrails? No  Home free of loose throw rugs in walkways, pet beds, electrical cords, etc? Yes  Adequate lighting in your home to reduce risk of falls? Yes   ASSISTIVE DEVICES UTILIZED TO PREVENT FALLS:  Life alert? No  Use of a cane, walker or w/c? No  Grab bars in the bathroom? No  Shower chair or bench in shower? No  Elevated toilet seat or a handicapped toilet? No   TIMED UP AND GO:  Was the test performed? No .  Length of time to ambulate 10  feet: 0 sec.   Gait steady and fast without use of assistive device  Cognitive Function: No flowsheet data found.         Immunizations Immunization History  Administered Date(s) Administered  . Fluad Quad(high Dose 65+) 03/04/2019, 05/16/2020  . Influenza, High Dose Seasonal PF 04/15/2017, 04/22/2018  . Influenza,inj,Quad PF,6+ Mos 04/09/2014, 05/11/2016  . Influenza-Unspecified 04/26/2013, 05/09/2015  . PFIZER(Purple Top)SARS-COV-2 Vaccination 08/18/2019, 09/10/2019, 05/18/2020  . Pneumococcal Conjugate-13 11/13/2014  . Pneumococcal Polysaccharide-23 05/11/2016  . Pneumococcal-Unspecified 02/24/2010  . Tdap 08/20/2010  . Zoster 08/06/2009  . Zoster Recombinat (Shingrix) 04/26/2017    TDAP status: Due, Education has been provided regarding the importance of this vaccine. Advised may receive this vaccine at local pharmacy or Health Dept. Aware to provide a copy of the vaccination record if obtained from local pharmacy or Health Dept. Verbalized acceptance and understanding.  Flu Vaccine status: Up to date  Pneumococcal vaccine status: Up to date  Covid-19 vaccine status: Completed vaccines  Qualifies for Shingles Vaccine? Yes   Zostavax completed Yes   Shingrix Completed?: Yes  Screening Tests Health Maintenance  Topic Date Due  . TETANUS/TDAP  08/20/2020  . HEMOGLOBIN A1C  11/13/2020  . FOOT EXAM  01/15/2021  . OPHTHALMOLOGY EXAM  04/16/2021  . COLONOSCOPY (Pts 45-61yrs Insurance coverage will need to be confirmed)  11/09/2021  . INFLUENZA VACCINE  Completed  . COVID-19 Vaccine  Completed  . Hepatitis C Screening  Completed  . PNA vac Low Risk Adult  Completed  . HPV VACCINES  Aged Out    Health Maintenance  Health Maintenance Due  Topic Date Due  . TETANUS/TDAP  08/20/2020    Colorectal cancer screening: Type of screening: Colonoscopy. Completed 11/10/2018. Repeat every 3 years  Lung Cancer Screening: (Low Dose CT Chest recommended if Age 60-80 years, 30  pack-year currently smoking OR have quit w/in 15years.) does qualify.   Lung Cancer Screening Referral: no  Additional Screening:  Hepatitis C Screening: does qualify; Completed yes  Vision Screening: Recommended annual ophthalmology exams for early detection of glaucoma and other disorders of the eye. Is the patient up to date with their annual eye exam?  Yes  Who is the provider or what is the name of the office in which the patient attends annual eye exams? [facility] If pt is not established with a provider, would they like to be referred to a provider to establish care? No .   Dental Screening: Recommended annual dental exams for proper oral hygiene  Community Resource Referral / Chronic Care Management: CRR required this visit?  No   CCM required this visit?  No      Plan:     I have personally reviewed and noted the following in the patient's chart:   . Medical and social history . Use of alcohol, tobacco or illicit drugs  . Current medications and supplements . Functional ability and status . Nutritional status . Physical activity . Advanced directives . List of other physicians . Hospitalizations, surgeries, and ER visits in previous 12 months . Vitals . Screenings to include cognitive, depression, and falls . Referrals and appointments  In addition, I have reviewed and discussed with patient certain preventive protocols, quality metrics, and best practice recommendations. A written personalized care plan for preventive services as well as general preventive health recommendations were provided to patient.     Sheral Flow, LPN   09/06/9177   Nurse Notes:  Patient is cogitatively intact. There were no vitals filed for this visit. There is no height or weight on file to calculate BMI. Medications reviewed with patient; no opioid use noted.

## 2020-09-05 NOTE — Patient Instructions (Signed)
Christopher [name] , Thank you for taking time to come for your Medicare Wellness Visit. I appreciate your ongoing commitment to your health goals. Please review the following plan we discussed and let me know if I can assist you in the future.   Screening recommendations/referrals: Colonoscopy: 11/10/2018; due every 3 years Recommended yearly ophthalmology/optometry visit for glaucoma screening and checkup Recommended yearly dental visit for hygiene and checkup  Vaccinations: Influenza vaccine: 05/16/2020 Pneumococcal vaccine: 11/13/2014, 05/11/2016 Tdap vaccine: 08/20/2010; overdue Shingles vaccine: 04/26/2017, 07/01/2017  Covid-19: 08/18/2019, 09/10/2019, 05/18/2020  Advanced directives: Advance directive discussed with you today. Even though you declined this today please call our office should you change your mind and we can give you the proper paperwork for you to fill out.  Conditions/risks identified: Yes; Reviewed health maintenance screenings with patient today and relevant education, vaccines, and/or referrals were provided. Please continue to do your personal lifestyle choices by: daily care of teeth and gums, regular physical activity (goal should be 5 days a week for 30 minutes), eat a healthy diet, avoid tobacco and drug use, limiting any alcohol intake, taking a low-dose aspirin (if not allergic or have been advised by your provider otherwise) and taking vitamins and minerals as recommended by your provider. Continue doing brain stimulating activities (puzzles, reading, adult coloring books, staying active) to keep memory sharp. Continue to eat heart healthy diet (full of fruits, vegetables, whole grains, lean protein, water--limit salt, fat, and sugar intake) and increase physical activity as tolerated.  Next appointment: Please schedule your next Medicare Wellness Visit with your Nurse Health Advisor in 1 year by calling [phone].  Preventive Care 43 Years and Older, Male Preventive care  refers to lifestyle choices and visits with your health care provider that can promote health and wellness. What does preventive care include?  A yearly physical exam. This is also called an annual well check.  Dental exams once or twice a year.  Routine eye exams. Ask your health care provider how often you should have your eyes checked.  Personal lifestyle choices, including:  Daily care of your teeth and gums.  Regular physical activity.  Eating a healthy diet.  Avoiding tobacco and drug use.  Limiting alcohol use.  Practicing safe sex.  Taking low doses of aspirin every day.  Taking vitamin and mineral supplements as recommended by your health care provider. What happens during an annual well check? The services and screenings done by your health care provider during your annual well check will depend on your age, overall health, lifestyle risk factors, and family history of disease. Counseling  Your health care provider may ask you questions about your:  Alcohol use.  Tobacco use.  Drug use.  Emotional well-being.  Home and relationship well-being.  Sexual activity.  Eating habits.  History of falls.  Memory and ability to understand (cognition).  Work and work Statistician. Screening  You may have the following tests or measurements:  Height, weight, and BMI.  Blood pressure.  Lipid and cholesterol levels. These may be checked every 5 years, or more frequently if you are over 59 years old.  Skin check.  Lung cancer screening. You may have this screening every year starting at age 61 if you have a 30-pack-year history of smoking and currently smoke or have quit within the past 15 years.  Fecal occult blood test (FOBT) of the stool. You may have this test every year starting at age 30.  Flexible sigmoidoscopy or colonoscopy. You may have a sigmoidoscopy  every 5 years or a colonoscopy every 10 years starting at age 7.  Prostate cancer screening.  Recommendations will vary depending on your family history and other risks.  Hepatitis C blood test.  Hepatitis B blood test.  Sexually transmitted disease (STD) testing.  Diabetes screening. This is done by checking your blood sugar (glucose) after you have not eaten for a while (fasting). You may have this done every 1-3 years.  Abdominal aortic aneurysm (AAA) screening. You may need this if you are a current or former smoker.  Osteoporosis. You may be screened starting at age 64 if you are at high risk. Talk with your health care provider about your test results, treatment options, and if necessary, the need for more tests. Vaccines  Your health care provider may recommend certain vaccines, such as:  Influenza vaccine. This is recommended every year.  Tetanus, diphtheria, and acellular pertussis (Tdap, Td) vaccine. You may need a Td booster every 10 years.  Zoster vaccine. You may need this after age 82.  Pneumococcal 13-valent conjugate (PCV13) vaccine. One dose is recommended after age 73.  Pneumococcal polysaccharide (PPSV23) vaccine. One dose is recommended after age 19. Talk to your health care provider about which screenings and vaccines you need and how often you need them. This information is not intended to replace advice given to you by your health care provider. Make sure you discuss any questions you have with your health care provider. Document Released: 07/19/2015 Document Revised: 03/11/2016 Document Reviewed: 04/23/2015 Elsevier Interactive Patient Education  2017 [employer] Prevention in the Home Falls can cause injuries. They can happen to people of all ages. There are many things you can do to make your home safe and to help prevent falls. What can I do on the outside of my home?  Regularly fix the edges of walkways and driveways and fix any cracks.  Remove anything that might make you trip as you walk through a door, such as a raised step or  threshold.  Trim any bushes or trees on the path to your home.  Use bright outdoor lighting.  Clear any walking paths of anything that might make someone trip, such as rocks or tools.  Regularly check to see if handrails are loose or broken. Make sure that both sides of any steps have handrails.  Any raised decks and porches should have guardrails on the edges.  Have any leaves, snow, or ice cleared regularly.  Use sand or salt on walking paths during winter.  Clean up any spills in your garage right away. This includes oil or grease spills. What can I do in the bathroom?  Use night lights.  Install grab bars by the toilet and in the tub and shower. Do not use towel bars as grab bars.  Use non-skid mats or decals in the tub or shower.  If you need to sit down in the shower, use a plastic, non-slip stool.  Keep the floor dry. Clean up any water that spills on the floor as soon as it happens.  Remove soap buildup in the tub or shower regularly.  Attach bath mats securely with double-sided non-slip rug tape.  Do not have throw rugs and other things on the floor that can make you trip. What can I do in the bedroom?  Use night lights.  Make sure that you have a light by your bed that is easy to reach.  Do not use any sheets or blankets that are too  big for your bed. They should not hang down onto the floor.  Have a firm chair that has side arms. You can use this for support while you get dressed.  Do not have throw rugs and other things on the floor that can make you trip. What can I do in the kitchen?  Clean up any spills right away.  Avoid walking on wet floors.  Keep items that you use a lot in easy-to-reach places.  If you need to reach something above you, use a strong step stool that has a grab bar.  Keep electrical cords out of the way.  Do not use floor polish or wax that makes floors slippery. If you must use wax, use non-skid floor wax.  Do not have  throw rugs and other things on the floor that can make you trip. What can I do with my stairs?  Do not leave any items on the stairs.  Make sure that there are handrails on both sides of the stairs and use them. Fix handrails that are broken or loose. Make sure that handrails are as long as the stairways.  Check any carpeting to make sure that it is firmly attached to the stairs. Fix any carpet that is loose or worn.  Avoid having throw rugs at the top or bottom of the stairs. If you do have throw rugs, attach them to the floor with carpet tape.  Make sure that you have a light switch at the top of the stairs and the bottom of the stairs. If you do not have them, ask someone to add them for you. What else can I do to help prevent falls?  Wear shoes that:  Do not have high heels.  Have rubber bottoms.  Are comfortable and fit you well.  Are closed at the toe. Do not wear sandals.  If you use a stepladder:  Make sure that it is fully opened. Do not climb a closed stepladder.  Make sure that both sides of the stepladder are locked into place.  Ask someone to hold it for you, if possible.  Clearly mark and make sure that you can see:  Any grab bars or handrails.  First and last steps.  Where the edge of each step is.  Use tools that help you move around (mobility aids) if they are needed. These include:  Canes.  Walkers.  Scooters.  Crutches.  Turn on the lights when you go into a dark area. Replace any light bulbs as soon as they burn out.  Set up your furniture so you have a clear path. Avoid moving your furniture around.  If any of your floors are uneven, fix them.  If there are any pets around you, be aware of where they are.  Review your medicines with your doctor. Some medicines can make you feel dizzy. This can increase your chance of falling. Ask your doctor what other things that you can do to help prevent falls. This information is not intended to  replace advice given to you by your health care provider. Make sure you discuss any questions you have with your health care provider. Document Released: 04/18/2009 Document Revised: 11/28/2015 Document Reviewed: 07/27/2014 Elsevier Interactive Patient Education  2017 [employer].

## 2020-09-07 ENCOUNTER — Other Ambulatory Visit: Payer: Self-pay | Admitting: Internal Medicine

## 2020-09-16 ENCOUNTER — Ambulatory Visit (INDEPENDENT_AMBULATORY_CARE_PROVIDER_SITE_OTHER): Payer: Medicare Other | Admitting: Internal Medicine

## 2020-09-16 ENCOUNTER — Encounter: Payer: Self-pay | Admitting: Internal Medicine

## 2020-09-16 ENCOUNTER — Other Ambulatory Visit: Payer: Self-pay

## 2020-09-16 VITALS — BP 126/80 | HR 66 | Temp 97.8°F | Resp 18 | Ht 70.0 in | Wt 235.2 lb

## 2020-09-16 DIAGNOSIS — E118 Type 2 diabetes mellitus with unspecified complications: Secondary | ICD-10-CM

## 2020-09-16 LAB — POCT GLYCOSYLATED HEMOGLOBIN (HGB A1C): Hemoglobin A1C: 6.8 % — AB (ref 4.0–5.6)

## 2020-09-16 NOTE — Progress Notes (Signed)
° °  Subjective:   Patient ID: Christopher [name], male    DOB: 1951-01-08, 70 y.o.   MRN: [id number]  HPI The patient is a 70 YO man coming in for diabetes follow up. We had stopped his jardiance after last visit. He has been maintaining diet changes. Exercising not much but has plans to do this soon with weather improving. Denies new numbness or tingling in feet or hands. Taking metformin without GI problems.   Review of Systems  Constitutional: Negative.   HENT: Negative.   Eyes: Negative.   Respiratory: Negative for cough, chest tightness and shortness of breath.   Cardiovascular: Negative for chest pain, palpitations and leg swelling.  Gastrointestinal: Negative for abdominal distention, abdominal pain, constipation, diarrhea, nausea and vomiting.  Musculoskeletal: Negative.   Skin: Negative.   Neurological: Negative.   Psychiatric/Behavioral: Negative.     Objective:  Physical Exam Constitutional:      Appearance: He is well-developed.  HENT:     Head: Normocephalic and atraumatic.  Cardiovascular:     Rate and Rhythm: Normal rate and regular rhythm.  Pulmonary:     Effort: Pulmonary effort is normal. No respiratory distress.     Breath sounds: Normal breath sounds. No wheezing or rales.  Abdominal:     General: Bowel sounds are normal. There is no distension.     Palpations: Abdomen is soft.     Tenderness: There is no abdominal tenderness. There is no rebound.  Musculoskeletal:     Cervical back: Normal range of motion.  Skin:    General: Skin is warm and dry.  Neurological:     Mental Status: He is alert and oriented to person, place, and time.     Coordination: Coordination normal.     Vitals:   09/16/20 1332  BP: 126/80  Pulse: 66  Resp: 18  Temp: 97.8 F (36.6 C)  TempSrc: Oral  SpO2: 98%  Weight: 235 lb 3.2 oz (106.7 kg)  Height: 5\' 10"  (1.778 m)    This visit occurred during the SARS-CoV-2 public health emergency.  Safety protocols were in place,  including screening questions prior to the visit, additional usage of staff PPE, and extensive cleaning of exam room while observing appropriate contact time as indicated for disinfecting solutions.   Assessment & Plan:

## 2020-09-16 NOTE — Patient Instructions (Addendum)
Your HgA1c is 6.8 which is at goal.   We will keep the medicines the same.

## 2020-09-17 ENCOUNTER — Ambulatory Visit: Payer: Medicare Other | Admitting: Internal Medicine

## 2020-09-17 NOTE — Assessment & Plan Note (Signed)
POC HgA1c 6.8 on metformin 1000 mg BID. Encouraged to continue with dietary changes and intentional weight loss as well as adding exercise to help. He is at goal and on ACE-I and statin.

## 2020-09-23 ENCOUNTER — Ambulatory Visit (INDEPENDENT_AMBULATORY_CARE_PROVIDER_SITE_OTHER): Payer: Medicare Other

## 2020-09-23 DIAGNOSIS — Z95810 Presence of automatic (implantable) cardiac defibrillator: Secondary | ICD-10-CM

## 2020-09-23 DIAGNOSIS — I5022 Chronic systolic (congestive) heart failure: Secondary | ICD-10-CM | POA: Diagnosis not present

## 2020-09-25 NOTE — Progress Notes (Signed)
EPIC Encounter for ICM Monitoring  Patient Name: Christopher [name] is a 70 y.o. male Date: 09/25/2020 Primary Care Physican: Hoyt Koch, MD Primary Cardiologist:Nelson Electrophysiologist:Allred 12/3/2021Weight: 231lbs  Spoke with patient and reports feeling well at this time.  Denies fluid symptoms.    Optivol thoracic impedance normal.  No diuretic  Labs: 05/16/2020 Creatinine 1.00, BUN 20, Potassium 3.3, Sodium 138, GFR 7613 03/31/2020 Creatinine 1.00, BUN 15, Potassium 3.5, Sodium 139, GFR >60 01/03/2020 Creatinine 1.08, BUN 15, Potassium 3.9, Sodium 135, GFR 69-80 07/18/2019 Creatinine 1.02, BUN 15, Potassium 3.5, 137, GFR 78.22  Recommendations: No changes and encouraged to call if experiencing any fluid symptoms.  Follow-up plan: ICM clinic phone appointment on4/26/2022. 91 day device clinic remote transmission5/16/2022.   EP/Cardiology Office Visits:Recall6/25/2022 with Dr Rayann Heman. Patient aware to call Dr Francesca Oman office for appointment (last visit 07/2019).   Copy of ICM check sent to Dr.Allred.   3 month ICM trend: 09/23/2020.    1 Year ICM trend:       Rosalene Billings, RN 09/25/2020 4:43 PM

## 2020-10-03 DIAGNOSIS — L570 Actinic keratosis: Secondary | ICD-10-CM | POA: Diagnosis not present

## 2020-10-15 DIAGNOSIS — H353132 Nonexudative age-related macular degeneration, bilateral, intermediate dry stage: Secondary | ICD-10-CM | POA: Diagnosis not present

## 2020-10-22 ENCOUNTER — Telehealth: Payer: Medicare Other

## 2020-10-22 NOTE — Progress Notes (Deleted)
Chronic Care Management Pharmacy Note  10/22/2020 Name:  Christopher [name] MRN:  [id number] DOB:  Oct 31, 1950  Subjective: Christopher [name] is an 70 y.o. year old male who is a primary patient of Hoyt Koch, MD.  The CCM team was consulted for assistance with disease management and care coordination needs.    Engaged with patient by telephone for follow up visit in response to provider referral for pharmacy case management and/or care coordination services.   Consent to Services:  The patient was given information about Chronic Care Management services, agreed to services, and gave verbal consent prior to initiation of services.  Please see initial visit note for detailed documentation.   Patient Care Team: Hoyt Koch, MD as PCP - General (Internal Medicine) [name], MD as PCP - Cardiology (Cardiology) Thompson Grayer, MD as PCP - Electrophysiology (Cardiology) [name], MD as Attending Physician (Cardiology) [name], MD as Attending Physician (Cardiothoracic Surgery) Deneise Lever, MD as Attending Physician (Pulmonary Disease) Sherren Mocha, MD as Attending Physician (Cardiology) Juanita Craver, MD as Consulting Physician (Gastroenterology) Thompson Grayer, MD (Cardiology) Charlton Haws, [facility] as Pharmacist (Pharmacist)  Recent office visits: 09/16/20 Dr Sharlet Salina OV: DM f/u; A1c 6.8 off of Jardiance, continue metformin.  Recent consult visits: [facility] visits: None in previous 6 months  Objective:  Lab Results  Component Value Date   CREATININE 1.00 05/16/2020   BUN 20 05/16/2020   GFR 76.13 05/16/2020   GFRNONAA >60 03/31/2020   GFRAA >60 03/31/2020   NA 138 05/16/2020   K 3.3 (L) 05/16/2020   CALCIUM 9.4 05/16/2020   CO2 28 05/16/2020   GLUCOSE 129 (H) 05/16/2020    Lab Results  Component Value Date/Time   HGBA1C 6.8 (A) 09/16/2020 01:42 PM   HGBA1C 6.3 05/16/2020 03:53 PM   HGBA1C 11.3 (A) 01/16/2020 01:18  PM   HGBA1C 9.7 (H) 07/18/2019 02:54 PM   FRUCTOSAMINE 246 01/13/2018 10:29 AM   GFR 76.13 05/16/2020 03:53 PM   GFR 72.22 07/18/2019 02:54 PM   MICROALBUR 1.8 07/18/2019 03:00 PM    Last diabetic Eye exam:  Lab Results  Component Value Date/Time   HMDIABEYEEXA No Retinopathy 04/16/2020 12:00 AM    Last diabetic Foot exam: No results found for: HMDIABFOOTEX   Lab Results  Component Value Date   CHOL 119 07/18/2019   HDL 32.60 (L) 07/18/2019   LDLCALC 52 04/17/2019   LDLDIRECT 52.0 07/18/2019   TRIG 366.0 (H) 07/18/2019   CHOLHDL 4 07/18/2019    Hepatic Function Latest Ref Rng & Units 05/16/2020 07/18/2019 04/17/2019  Total Protein 6.0 - 8.3 g/dL 6.9 6.9 6.8  Albumin 3.5 - 5.2 g/dL 4.3 4.2 4.4  AST 0 - 37 U/L $Remo'23 23 20  'gfdFL$ ALT 0 - 53 U/L $Remo'23 30 31  'rcebY$ Alk Phosphatase 39 - 117 U/L 41 65 86  Total Bilirubin 0.2 - 1.2 mg/dL 0.6 0.6 0.6  Bilirubin, Direct 0.00 - 0.40 mg/dL - - 0.19    Lab Results  Component Value Date/Time   TSH 3.780 01/03/2020 01:18 PM   TSH 1.966 05/05/2017 04:29 PM   TSH 2.75 11/01/2012 12:37 PM    CBC Latest Ref Rng & Units 03/31/2020 07/18/2019 05/19/2018  WBC 4.0 - 10.5 K/uL 7.5 6.8 6.4  Hemoglobin 13.0 - 17.0 g/dL 15.5 14.6 15.0  Hematocrit 39.0 - 52.0 % 46.5 43.2 44.6  Platelets 150 - 400 K/uL 201 162.0 156.0    No results found for:  VD25OH  Clinical ASCVD: Yes  The ASCVD Risk score Mikey Bussing DC Jr., et al., 2013) failed to calculate for the following reasons:   The valid total cholesterol range is 130 to 320 mg/dL    Depression screen [facility] 2/9 09/05/2020 09/05/2020 05/24/2019  Decreased Interest 0 0 0  Down, Depressed, Hopeless 0 0 0  PHQ - 2 Score 0 0 0  Some recent data might be hidden     Lab Results  Component Value Date/Time   PSA 0.99 05/03/2013 09:59 AM    Social History   Tobacco Use  Smoking Status Former Smoker  . Packs/day: 2.50  . Years: 5.00  . Pack years: 12.50  . Types: Cigarettes  . Quit date: 07/06/1978  . Years since  quitting: 42.3  Smokeless Tobacco Never Used  Tobacco Comment   social drinker   BP Readings from Last 3 Encounters:  09/16/20 126/80  05/16/20 138/78  03/31/20 (!) 169/78   Pulse Readings from Last 3 Encounters:  09/16/20 66  05/16/20 72  03/31/20 65   Wt Readings from Last 3 Encounters:  09/16/20 235 lb 3.2 oz (106.7 kg)  05/16/20 229 lb (103.9 kg)  01/16/20 228 lb (103.4 kg)   BMI Readings from Last 3 Encounters:  09/16/20 33.75 kg/m  05/16/20 32.86 kg/m  01/16/20 32.71 kg/m    Assessment/Interventions: Review of patient past medical history, allergies, medications, health status, including review of consultants reports, laboratory and other test data, was performed as part of comprehensive evaluation and provision of chronic care management services.   SDOH:  (Social Determinants of Health) assessments and interventions performed: Yes  SDOH Screenings   Alcohol Screen: Low Risk   . Last Alcohol Screening Score (AUDIT): 0  Depression (PHQ2-9): Low Risk   . PHQ-2 Score: 0  Financial Resource Strain: Low Risk   . Difficulty of Paying Living Expenses: Not hard at all  Food Insecurity: No Food Insecurity  . Worried About Charity fundraiser in the Last Year: Never true  . Ran Out of Food in the Last Year: Never true  Housing: Low Risk   . Last Housing Risk Score: 0  Physical Activity: Sufficiently Active  . Days of Exercise per Week: 5 days  . Minutes of Exercise per Session: 30 min  Social Connections: Moderately Integrated  . Frequency of Communication with Friends and Family: More than three times a week  . Frequency of Social Gatherings with Friends and Family: More than three times a week  . Attends Religious Services: More than 4 times per year  . Active Member of Clubs or Organizations: No  . Attends Archivist Meetings: More than 4 times per year  . Marital Status: Divorced  Stress: No Stress Concern Present  . Feeling of Stress : Not at all   Tobacco Use: Medium Risk  . Smoking Tobacco Use: Former Smoker  . Smokeless Tobacco Use: Never Used  Transportation Needs: No Transportation Needs  . Lack of Transportation (Medical): No  . Lack of Transportation (Non-Medical): No    CCM Care Plan  Allergies  Allergen Reactions  . Codeine     Causes bad constipation  . Dust Mite Extract Cough  . Pollen Extract Cough    Medications Reviewed Today    Reviewed by Hoyt Koch, MD (Physician) on 09/16/20 at 1341  Med List Status: <None>  Medication Order Taking? Sig Documenting Provider Last Dose Status Informant  acetaminophen (TYLENOL) 500 MG tablet [id number] Yes Take 500  mg by mouth every 6 (six) hours as needed for mild pain or headache.  [provider] Taking Active Self  amLODipine (NORVASC) 5 MG tablet [id number] Yes TAKE 1 TABLET BY MOUTH  DAILY Hoyt Koch, MD Taking Active   aspirin EC 81 MG tablet [id number] Yes Take 81 mg by mouth daily. [provider] Taking Active Self  atorvastatin (LIPITOR) 40 MG tablet [id number] Yes TAKE 1 TABLET BY MOUTH  DAILY Hoyt Koch, MD Taking Active   cetirizine (ZYRTEC) 10 MG tablet [id number] Yes Take 10 mg by mouth daily. [provider] Taking Active Self  cholecalciferol (VITAMIN D) 25 MCG (1000 UNIT) tablet [id number] Yes Take 1 tablet by mouth daily.  [provider] Taking Active Self  fenofibrate (TRICOR) 145 MG tablet [id number] Yes TAKE 1 TABLET BY MOUTH  DAILY Hoyt Koch, MD Taking Active   fluticasone [facility]) 50 MCG/ACT nasal spray [id number] Yes USE 2 SPRAYS IN BOTH  NOSTRILS DAILY Hoyt Koch, MD Taking Active   metFORMIN (GLUCOPHAGE) 1000 MG tablet [id number] Yes Take 1 tablet (1,000 mg total) by mouth 2 (two) times daily with a meal. Hoyt Koch, MD Taking Active Self  metoprolol succinate (TOPROL-XL) 100 MG 24 hr tablet [id number] Yes TAKE 1 TABLET BY MOUTH  DAILY OR IMMEDIATELY  FOLLOWING  A MEAL Hoyt Koch, MD Taking Active   ramipril (ALTACE) 10 MG capsule [id number] Yes TAKE 1 CAPSULE BY MOUTH  DAILY Hoyt Koch, MD Taking Active   tamsulosin (FLOMAX) 0.4 MG CAPS capsule [id number] Yes TAKE 1 CAPSULE BY MOUTH  DAILY Hoyt Koch, MD Taking Active           Patient Active Problem List   Diagnosis Date Noted  . Ventricular tachycardia ([place]) 01/03/2020  . Nonischemic cardiomyopathy ([place]) 09/27/2017  . Heart valve disease 09/27/2017  . History of cardiac arrest 09/27/2017  . Diabetes mellitus type 2 with complications ([place]) 38/17/7116  . Alcohol use 05/05/2017  . CAD (coronary artery disease) 05/05/2017  . OSA (obstructive sleep apnea) 05/05/2017  . History of repair of mitral valve 05/05/2017  . Benign prostatic hyperplasia 05/16/2015  . Routine general medical examination at a health care facility 11/13/2014  . Chronic systolic CHF (congestive heart failure) ([place]) 10/25/2012  . Atrial fibrillation ([place]) 10/25/2012  . Hyperlipidemia associated with type 2 diabetes mellitus ([place]) 09/04/2012  . Severe mitral regurgitation   . Coronary artery disease 08/12/2012  . Essential hypertension 09/27/2008  . SLEEP APNEA 09/27/2008    Immunization History  Administered Date(s) Administered  . Fluad Quad(high Dose 65+) 03/04/2019, 05/16/2020  . Influenza, High Dose Seasonal PF 05/03/2015, 04/15/2017, 04/22/2018  . Influenza,inj,Quad PF,6+ Mos 04/09/2014, 05/11/2016  . Influenza-Unspecified 04/26/2013, 05/09/2015  . PFIZER(Purple Top)SARS-COV-2 Vaccination 08/18/2019, 09/10/2019, 05/18/2020  . Pneumococcal Conjugate-13 11/13/2014  . Pneumococcal Polysaccharide-23 05/11/2016  . Pneumococcal-Unspecified 02/24/2010  . Tdap 08/20/2010  . Zoster 08/06/2009  . Zoster Recombinat (Shingrix) 04/26/2017, 07/01/2017    Conditions to be addressed/monitored:  Hypertension, Hyperlipidemia, Diabetes, Atrial Fibrillation, Heart Failure, Coronary Artery  Disease and BPH  There are no care plans that you recently modified to display for this patient.    Medication Assistance: None required.  Patient affirms current coverage meets needs.  Patient's preferred pharmacy is:  [facility], [address], [address] [place], [address] [place] [address] Phone: [phone] Fax: [phone]  Uses pill box? Yes Pt endorses 100% compliance  We discussed:  Current pharmacy is preferred with insurance plan and patient is satisfied with pharmacy services Patient decided to: Continue current medication management strategy  Care Plan and Follow Up Patient Decision:  Patient agrees to Care Plan and Follow-up.  Plan: {CM FOLLOW UP GTXM:46803}  Charlene Brooke, PharmD, Para March, CPP Clinical Pharmacist [facility] [phone]     Current Barriers:  . {pharmacybarriers:24917}  Pharmacist Clinical Goal(s):  Marland Kitchen Patient will {PHARMACYGOALCHOICES:24921} through collaboration with PharmD and provider.   Interventions: . 1:1 collaboration with Hoyt Koch, MD regarding development and update of comprehensive plan of care as evidenced by provider attestation and co-signature . Inter-disciplinary care team collaboration (see longitudinal plan of care) . Comprehensive medication review performed; medication list updated in electronic medical record  Heart Failure / Hypertension   Type: Diastolic (grade II dysfunction), mild LVH Last ejection fraction: 55-60% (01/26/2020) NYHA Class: I (no actitivty limitation) BP goal is:  <130/80  Patient checks BP at home infrequently Patient home BP readings are ranging: 120-130s/80s  Patient has failed these meds in past: n/a  Patient is currently controlled on the following medications:   Amlodipine 5 mg daily AM  Metoprolol succinate 100 mg daily AM  Ramipril 10 mg daily AM  We discussed diet and exercise  extensively - pt goes to gym 3-4 days per week, walks >10-20 miles per week. Uses weight machines as well. -discussed indication and benefits of medications; pt reports episode of extreme dizziness a few months ago that caused him to go to ED, symptoms had resolved by the time he got there and etiology event was unclear - dehydration/orthastasis most likely per ED notes. Discussed importance of adequate hydration and changing position slowly to avoid orthostasis  Plan: Continue current medications and control with diet and exercise   AFIB   Patient is currently rate controlled. CHADSVASC=5. No anticoagulation d/t hx of thigh hematoma on Xarelto  Patient has failed these meds in past: Eliquis, Xarelto, warfarin Patient is currently controlled on the following medications:   Metoprolol succinate 100 mg daily AM  Aspirin 81 mg daily AM  We discussed:  Benefits of medications; importance of aspirin given cardiac history; he is not on anticoagulation d/t hx of thigh hematoma while on Xarelto  Plan: Continue current medications  Hyperlipidemia / CAD   LDL goal < 70 CAD, NICM  Patient has failed these meds in past: n/a Patient is currently controlled on the following medications:   Atorvastatin 40 mg daily PM  Fenofibrate 145 mg daily AM  We discussed:  diet and exercise extensively; Cholesterol goals; benefits of statin for ASCVD risk reduction; indication for fenofibrate for triglycerides -of note TRIG were 274 when fenofibrate was started in 12/2018. Recent trig elevations are likely due to uncontrolled DM and may be significantly improved with recent improvement in A1c; may consider d/c fenofibrate in future if triglycerides are improved  Plan Continue current medications  Consider d/c fenofibrate if triglycerides improve with improved DM control  Diabetes   A1c goal <7% Checking BG: Never  Patient has failed these meds in past: Jardiance 25 mg (cost  $400) Patient is currently controlled on the following medications:  Metformin 1000 mg BID  We discussed: diet and exercise extensively - cut back on carbs on sweets; A1c and BG goals; most recent A1c was while pt was taking both metformin and Jardiance, expect some rise in A1c with d/c of Jardiance but with lifestyle modifications pt may be able to keep A1c below  goal of 7%  Plan: Continue current medications and control with diet and exercise  BPH   Patient has failed these meds in past: n/a Patient is currently controlled on the following medications:   Tamsulosin 0.4 mg daily PM  We discussed: indication for tamsulosin; pt denies urinary issues  Plan: Continue current medications   Patient Goals/Self-Care Activities . Patient will:  - {pharmacypatientgoals:24919}  Follow Up Plan: {CM FOLLOW UP HDQO:49252}

## 2020-10-23 ENCOUNTER — Telehealth: Payer: Self-pay | Admitting: Pharmacist

## 2020-10-23 NOTE — Progress Notes (Addendum)
Chronic Care Management Pharmacy Assistant   Name: Christopher [name]  MRN: [id number] DOB: 1951/03/10   Reason for Encounter: Hypertension Disease State Call   Conditions to be addressed/monitored: HTN   Recent office visits:  09/16/20 Dr. Sharlet Salina, no medication changes  Recent consult visits:  None ID  Hospital visits:  None in previous 6 months  Medications: Outpatient Encounter Medications as of 10/23/2020  Medication Sig   acetaminophen (TYLENOL) 500 MG tablet Take 500 mg by mouth every 6 (six) hours as needed for mild pain or headache.    amLODipine (NORVASC) 5 MG tablet TAKE 1 TABLET BY MOUTH  DAILY   aspirin EC 81 MG tablet Take 81 mg by mouth daily.   atorvastatin (LIPITOR) 40 MG tablet TAKE 1 TABLET BY MOUTH  DAILY   cetirizine (ZYRTEC) 10 MG tablet Take 10 mg by mouth daily.   cholecalciferol (VITAMIN D) 25 MCG (1000 UNIT) tablet Take 1 tablet by mouth daily.    fenofibrate (TRICOR) 145 MG tablet TAKE 1 TABLET BY MOUTH  DAILY   fluticasone (FLONASE) 50 MCG/ACT nasal spray USE 2 SPRAYS IN BOTH  NOSTRILS DAILY   metFORMIN (GLUCOPHAGE) 1000 MG tablet Take 1 tablet (1,000 mg total) by mouth 2 (two) times daily with a meal.   metoprolol succinate (TOPROL-XL) 100 MG 24 hr tablet TAKE 1 TABLET BY MOUTH  DAILY OR IMMEDIATELY  FOLLOWING A MEAL   ramipril (ALTACE) 10 MG capsule TAKE 1 CAPSULE BY MOUTH  DAILY   tamsulosin (FLOMAX) 0.4 MG CAPS capsule TAKE 1 CAPSULE BY MOUTH  DAILY   No facility-administered encounter medications on file as of 10/23/2020.    Reviewed chart prior to disease state call. Spoke with patient regarding BP  Recent Office Vitals: BP Readings from Last 3 Encounters:  09/16/20 126/80  05/16/20 138/78  03/31/20 (!) 169/78   Pulse Readings from Last 3 Encounters:  09/16/20 66  05/16/20 72  03/31/20 65    Wt Readings from Last 3 Encounters:  09/16/20 235 lb 3.2 oz (106.7 kg)  05/16/20 229 lb (103.9 kg)  01/16/20 228 lb (103.4 kg)     Kidney  Function Lab Results  Component Value Date/Time   CREATININE 1.00 05/16/2020 03:53 PM   CREATININE 1.00 03/31/2020 02:55 PM   CREATININE 1.07 05/03/2013 09:59 AM   GFR 76.13 05/16/2020 03:53 PM   GFRNONAA >60 03/31/2020 02:55 PM   GFRAA >60 03/31/2020 02:55 PM    BMP Latest Ref Rng & Units 05/16/2020 03/31/2020 01/03/2020  Glucose 70 - 99 mg/dL 129(H) 115(H) 319(H)  BUN 6 - 23 mg/dL 20 15 15   Creatinine 0.40 - 1.50 mg/dL 1.00 1.00 1.08  BUN/Creat Ratio 10 - 24 - - 14  Sodium 135 - 145 mEq/L 138 139 135  Potassium 3.5 - 5.1 mEq/L 3.3(L) 3.5 3.9  Chloride 96 - 112 mEq/L 102 103 94(L)  CO2 19 - 32 mEq/L 28 24 26   Calcium 8.4 - 10.5 mg/dL 9.4 9.2 9.3    Current antihypertensive regimen: Patient is taking amlodipine 5 mg daily, ramipril 10 mg daily, and metoprolol 100 mg 1 tab daily  How often are you checking your Blood Pressure? Patient states that he does not check blood pressure except when he has an appt at the doctor's office   Current home BP readings: Patient has not home readings  What recent interventions/DTPs have been made by any provider to improve Blood Pressure control since last CPP Visit: None ID  Any recent hospitalizations  or ED visits since last visit with CPP? Patient has not had any recent Hospital or ED visits  What diet changes have been made to improve Blood Pressure Control?  Patient states that he has not made any changes to diet  What exercise is being done to improve your Blood Pressure Control?  Patient states that he has not made any changes to exercise  Adherence Review: Is the patient currently on ACE/ARB medication?Yes, Ramipril Does the patient have >5 day gap between last estimated fill dates? No   Star Rating Drugs: Atorvastatin 08/15/20 90 ds Ramipril 08/15/20 90 ds  Ethelene Hal Clinical Pharmacist Assistant [phone]

## 2020-10-29 ENCOUNTER — Ambulatory Visit (INDEPENDENT_AMBULATORY_CARE_PROVIDER_SITE_OTHER): Payer: Medicare Other

## 2020-10-29 DIAGNOSIS — Z95810 Presence of automatic (implantable) cardiac defibrillator: Secondary | ICD-10-CM | POA: Diagnosis not present

## 2020-10-29 DIAGNOSIS — I5022 Chronic systolic (congestive) heart failure: Secondary | ICD-10-CM | POA: Diagnosis not present

## 2020-10-30 NOTE — Progress Notes (Signed)
EPIC Encounter for ICM Monitoring  Patient Name: JAH ALARID is a 70 y.o. male Date: 10/30/2020 Primary Care Physican: Hoyt Koch, MD Primary Cardiologist:Nelson Electrophysiologist:Allred 09/16/2020 OfficeWeight: 235lbs  Transmission reviewed.  Optivol thoracic impedance normal.  No diuretic  Labs: 05/16/2020 Creatinine 1.00, BUN 20, Potassium 3.3, Sodium 138, GFR 7613 03/31/2020 Creatinine 1.00, BUN 15, Potassium 3.5, Sodium 139, GFR >60 01/03/2020 Creatinine 1.08, BUN 15, Potassium 3.9, Sodium 135, GFR 69-80 07/18/2019 Creatinine 1.02, BUN 15, Potassium 3.5, 137, GFR 78.22  Recommendations: No changes.  Follow-up plan: ICM clinic phone appointment on6/12/2020. 91 day device clinic remote transmission5/16/2022.   EP/Cardiology Office Visits:Recall6/25/2022 with Dr Rayann Heman. Patient aware to call Dr Francesca Oman office for appointment (last visit 07/2019).   Copy of ICM check sent to Dr.Allred. .   3 month ICM trend: 10/29/2020.    1 Year ICM trend:       Rosalene Billings, RN 10/30/2020 12:06 PM

## 2020-11-12 DIAGNOSIS — L570 Actinic keratosis: Secondary | ICD-10-CM | POA: Diagnosis not present

## 2020-11-12 DIAGNOSIS — L578 Other skin changes due to chronic exposure to nonionizing radiation: Secondary | ICD-10-CM | POA: Diagnosis not present

## 2020-11-18 ENCOUNTER — Ambulatory Visit (INDEPENDENT_AMBULATORY_CARE_PROVIDER_SITE_OTHER): Payer: Medicare Other

## 2020-11-18 DIAGNOSIS — I428 Other cardiomyopathies: Secondary | ICD-10-CM | POA: Diagnosis not present

## 2020-11-19 LAB — CUP PACEART REMOTE DEVICE CHECK
Battery Remaining Longevity: 96 mo
Battery Voltage: 2.99 V
Brady Statistic RV Percent Paced: 0.04 %
Date Time Interrogation Session: 20220516063627
HighPow Impedance: 75 Ohm
Implantable Lead Implant Date: 20181102
Implantable Lead Location: 753860
Implantable Pulse Generator Implant Date: 20181102
Lead Channel Impedance Value: 437 Ohm
Lead Channel Impedance Value: 570 Ohm
Lead Channel Pacing Threshold Amplitude: 0.5 V
Lead Channel Pacing Threshold Pulse Width: 0.4 ms
Lead Channel Sensing Intrinsic Amplitude: 6.375 mV
Lead Channel Sensing Intrinsic Amplitude: 6.375 mV
Lead Channel Setting Pacing Amplitude: 2 V
Lead Channel Setting Pacing Pulse Width: 0.4 ms
Lead Channel Setting Sensing Sensitivity: 0.3 mV

## 2020-11-22 ENCOUNTER — Other Ambulatory Visit: Payer: Self-pay | Admitting: Internal Medicine

## 2020-12-09 ENCOUNTER — Telehealth: Payer: Self-pay | Admitting: Pharmacist

## 2020-12-09 ENCOUNTER — Ambulatory Visit (INDEPENDENT_AMBULATORY_CARE_PROVIDER_SITE_OTHER): Payer: Medicare Other

## 2020-12-09 DIAGNOSIS — I5022 Chronic systolic (congestive) heart failure: Secondary | ICD-10-CM | POA: Diagnosis not present

## 2020-12-09 DIAGNOSIS — Z95810 Presence of automatic (implantable) cardiac defibrillator: Secondary | ICD-10-CM

## 2020-12-09 NOTE — Progress Notes (Signed)
Chronic Care Management Pharmacy Assistant   Name: Christopher [name]  MRN: [id number] DOB: [birth year]   Reason for Encounter: Disease State Hypertension Call   Conditions to be addressed/monitored: HTN   Recent office visits:  None ID  Recent consult visits:  None ID  Hospital visits:  None in previous 6 months  Medications: Outpatient Encounter Medications as of 12/09/2020  Medication Sig  . acetaminophen (TYLENOL) 500 MG tablet Take 500 mg by mouth every 6 (six) hours as needed for mild pain or headache.   Marland Kitchen amLODipine (NORVASC) 5 MG tablet TAKE 1 TABLET BY MOUTH  DAILY  . aspirin EC 81 MG tablet Take 81 mg by mouth daily.  Marland Kitchen atorvastatin (LIPITOR) 40 MG tablet TAKE 1 TABLET BY MOUTH  DAILY  . cetirizine (ZYRTEC) 10 MG tablet Take 10 mg by mouth daily.  . cholecalciferol (VITAMIN D) 25 MCG (1000 UNIT) tablet Take 1 tablet by mouth daily.   . fenofibrate (TRICOR) 145 MG tablet TAKE 1 TABLET BY MOUTH  DAILY  . fluticasone (FLONASE) 50 MCG/ACT nasal spray USE 2 SPRAYS IN BOTH  NOSTRILS DAILY  . metFORMIN (GLUCOPHAGE) 1000 MG tablet TAKE 1 TABLET BY MOUTH  TWICE DAILY WITH A MEAL  . metoprolol succinate (TOPROL-XL) 100 MG 24 hr tablet TAKE 1 TABLET BY MOUTH  DAILY OR IMMEDIATELY  FOLLOWING A MEAL  . ramipril (ALTACE) 10 MG capsule TAKE 1 CAPSULE BY MOUTH  DAILY  . tamsulosin (FLOMAX) 0.4 MG CAPS capsule TAKE 1 CAPSULE BY MOUTH  DAILY   No facility-administered encounter medications on file as of 12/09/2020.   Pharmacist Review Reviewed chart prior to disease state call. Spoke with patient regarding BP  Recent Office Vitals: BP Readings from Last 3 Encounters:  09/16/20 126/80  05/16/20 138/78  03/31/20 (!) 169/78   Pulse Readings from Last 3 Encounters:  09/16/20 66  05/16/20 72  03/31/20 65    Wt Readings from Last 3 Encounters:  09/16/20 235 lb 3.2 oz (106.7 kg)  05/16/20 229 lb (103.9 kg)  01/16/20 228 lb (103.4 kg)     Kidney Function Lab Results  Component  Value Date/Time   CREATININE 1.00 05/16/2020 03:53 PM   CREATININE 1.00 03/31/2020 02:55 PM   CREATININE 1.07 05/03/2013 09:59 AM   GFR 76.13 05/16/2020 03:53 PM   GFRNONAA >60 03/31/2020 02:55 PM   GFRAA >60 03/31/2020 02:55 PM    BMP Latest Ref Rng & Units 05/16/2020 03/31/2020 01/03/2020  Glucose 70 - 99 mg/dL 129(H) 115(H) 319(H)  BUN 6 - 23 mg/dL 20 15 15   Creatinine 0.40 - 1.50 mg/dL 1.00 1.00 1.08  BUN/Creat Ratio 10 - 24 - - 14  Sodium 135 - 145 mEq/L 138 139 135  Potassium 3.5 - 5.1 mEq/L 3.3(L) 3.5 3.9  Chloride 96 - 112 mEq/L 102 103 94(L)  CO2 19 - 32 mEq/L 28 24 26   Calcium 8.4 - 10.5 mg/dL 9.4 9.2 9.3    . Current antihypertensive regimen:  Amlodipine 5 mg daily Metoprolol 100 mg 1 ab daily Ramipril 10 mg 1 tab daily  . How often are you checking your Blood Pressure? Patient states that he never checks blood pressure, Patient states he was never told to check at home   . Current home BP readings: Patient last blood pressure reading was 126/80 on 09/16/20  . What recent interventions/DTPs have been made by any provider to improve Blood Pressure control since last CPP Visit: None ID  . Any recent hospitalizations  or ED visits since last visit with CPP? No   . What diet changes have been made to improve Blood Pressure Control?  Patient states that he has not made any changes to diet  . What exercise is being done to improve your Blood Pressure Control?  Patient states that he is not exercising  Adherence Review: Is the patient currently on ACE/ARB medication? Yes Does the patient have >5 day gap between last estimated fill dates? No   Star Rating Drugs: Atorvastatin 11/08/20 90 d Ramipril 11/08/20 90 d  Ethelene Hal Clinical Pharmacist Assistant [phone]  Time spent:26

## 2020-12-11 NOTE — Progress Notes (Signed)
Remote ICD transmission.   

## 2020-12-11 NOTE — Progress Notes (Signed)
EPIC Encounter for ICM Monitoring  Patient Name: Christopher [name] is a 70 y.o. male Date: 12/11/2020 Primary Care Physican: Hoyt Koch, MD Primary Cardiologist:Nelson Electrophysiologist:Allred 09/16/2020 OfficeWeight: 235lbs (does not weigh at home)  Spoke with patient and reports feeling well at this time. Heart failure questions reviewed. Pt asymptomatic.  Optivol thoracic impedance normal.  No diuretic  Labs: 05/16/2020 Creatinine 1.00, BUN 20, Potassium 3.3, Sodium 138, GFR 7613 03/31/2020 Creatinine 1.00, BUN 15, Potassium 3.5, Sodium 139, GFR >60 01/03/2020 Creatinine 1.08, BUN 15, Potassium 3.9, Sodium 135, GFR 69-80 07/18/2019 Creatinine 1.02, BUN 15, Potassium 3.5, 137, GFR 78.22  Recommendations: No changes and encouraged to call if experiencing any fluid symptoms.  Follow-up plan: ICM clinic phone appointment on7/05/2021. 91 day device clinic remote transmission8/15/2022.   EP/Cardiology Office Visits:Recall6/25/2022 with Dr Rayann Heman. Patient aware to call Dr Francesca Oman office for appointment (last visit 07/2019).   Copy of ICM check sent to Dr.Allred.    3 month ICM trend: 12/09/2020.    1 Year ICM trend:       Rosalene Billings, RN 12/11/2020 1:26 PM

## 2021-01-13 ENCOUNTER — Ambulatory Visit (INDEPENDENT_AMBULATORY_CARE_PROVIDER_SITE_OTHER): Payer: Medicare Other

## 2021-01-13 DIAGNOSIS — I5022 Chronic systolic (congestive) heart failure: Secondary | ICD-10-CM | POA: Diagnosis not present

## 2021-01-13 DIAGNOSIS — Z95810 Presence of automatic (implantable) cardiac defibrillator: Secondary | ICD-10-CM | POA: Diagnosis not present

## 2021-01-15 NOTE — Progress Notes (Signed)
EPIC Encounter for ICM Monitoring  Patient Name: Christopher [name] is a 70 y.o. male Date: 01/15/2021 Primary Care Physican: Hoyt Koch, MD Primary Cardiologist: Meda Coffee Electrophysiologist: Allred 01/15/2021 Office Weight: 237 lbs                                Spoke with patient and heart failure questions reviewed.  Pt asymptomatic for fluid accumulation and feeing well.   Optivol thoracic impedance normal.    No diuretic   Labs: 05/16/2020 Creatinine 1.00, BUN 20, Potassium 3.3, Sodium 138, GFR 7613 03/31/2020 Creatinine 1.00, BUN 15, Potassium 3.5, Sodium 139, GFR >60 01/03/2020 Creatinine 1.08, BUN 15, Potassium 3.9, Sodium 135, GFR 69-80 07/18/2019 Creatinine 1.02, BUN 15, Potassium 3.5, 137, GFR 78.22   Recommendations:  No changes and encouraged to call if experiencing any fluid symptoms.   Follow-up plan: ICM clinic phone appointment on 02/18/2021.   91 day device clinic remote transmission 02/17/2021.      EP/Cardiology Office Visits: Recall 12/28/2020 with Dr Rayann Heman.   Patient aware to call Dr Francesca Oman office for appointment (last visit 07/2019).     Copy of ICM check sent to Dr. Rayann Heman.    3 month ICM trend: 01/13/2021.    1 Year ICM trend:       Rosalene Billings, RN 01/15/2021 2:16 PM

## 2021-01-23 ENCOUNTER — Encounter: Payer: Self-pay | Admitting: Internal Medicine

## 2021-02-14 ENCOUNTER — Encounter (HOSPITAL_BASED_OUTPATIENT_CLINIC_OR_DEPARTMENT_OTHER): Payer: Self-pay | Admitting: Emergency Medicine

## 2021-02-14 ENCOUNTER — Observation Stay (HOSPITAL_BASED_OUTPATIENT_CLINIC_OR_DEPARTMENT_OTHER)
Admission: EM | Admit: 2021-02-14 | Discharge: 2021-02-18 | Disposition: A | Discharge status: Home / Self Care | Payer: Medicare Other | Attending: Internal Medicine | Admitting: Internal Medicine

## 2021-02-14 ENCOUNTER — Emergency Department (HOSPITAL_BASED_OUTPATIENT_CLINIC_OR_DEPARTMENT_OTHER): Payer: Medicare Other

## 2021-02-14 DIAGNOSIS — K219 Gastro-esophageal reflux disease without esophagitis: Secondary | ICD-10-CM | POA: Diagnosis present

## 2021-02-14 DIAGNOSIS — Z79899 Other long term (current) drug therapy: Secondary | ICD-10-CM | POA: Insufficient documentation

## 2021-02-14 DIAGNOSIS — E1165 Type 2 diabetes mellitus with hyperglycemia: Secondary | ICD-10-CM | POA: Diagnosis present

## 2021-02-14 DIAGNOSIS — R41841 Cognitive communication deficit: Secondary | ICD-10-CM | POA: Diagnosis not present

## 2021-02-14 DIAGNOSIS — R2689 Other abnormalities of gait and mobility: Secondary | ICD-10-CM | POA: Diagnosis not present

## 2021-02-14 DIAGNOSIS — H532 Diplopia: Secondary | ICD-10-CM | POA: Diagnosis present

## 2021-02-14 DIAGNOSIS — I130 Hypertensive heart and chronic kidney disease with heart failure and stage 1 through stage 4 chronic kidney disease, or unspecified chronic kidney disease: Secondary | ICD-10-CM | POA: Diagnosis not present

## 2021-02-14 DIAGNOSIS — I10 Essential (primary) hypertension: Secondary | ICD-10-CM | POA: Diagnosis present

## 2021-02-14 DIAGNOSIS — I2510 Atherosclerotic heart disease of native coronary artery without angina pectoris: Secondary | ICD-10-CM | POA: Diagnosis not present

## 2021-02-14 DIAGNOSIS — R42 Dizziness and giddiness: Secondary | ICD-10-CM

## 2021-02-14 DIAGNOSIS — Z7982 Long term (current) use of aspirin: Secondary | ICD-10-CM | POA: Diagnosis not present

## 2021-02-14 DIAGNOSIS — I63219 Cerebral infarction due to unspecified occlusion or stenosis of unspecified vertebral arteries: Secondary | ICD-10-CM | POA: Diagnosis not present

## 2021-02-14 DIAGNOSIS — G4733 Obstructive sleep apnea (adult) (pediatric): Secondary | ICD-10-CM

## 2021-02-14 DIAGNOSIS — Z7984 Long term (current) use of oral hypoglycemic drugs: Secondary | ICD-10-CM | POA: Diagnosis not present

## 2021-02-14 DIAGNOSIS — E1122 Type 2 diabetes mellitus with diabetic chronic kidney disease: Secondary | ICD-10-CM | POA: Diagnosis not present

## 2021-02-14 DIAGNOSIS — Z20822 Contact with and (suspected) exposure to covid-19: Secondary | ICD-10-CM | POA: Insufficient documentation

## 2021-02-14 DIAGNOSIS — Z9861 Coronary angioplasty status: Secondary | ICD-10-CM | POA: Diagnosis not present

## 2021-02-14 DIAGNOSIS — I6521 Occlusion and stenosis of right carotid artery: Secondary | ICD-10-CM | POA: Diagnosis not present

## 2021-02-14 DIAGNOSIS — I69398 Other sequelae of cerebral infarction: Secondary | ICD-10-CM

## 2021-02-14 DIAGNOSIS — I480 Paroxysmal atrial fibrillation: Secondary | ICD-10-CM | POA: Diagnosis present

## 2021-02-14 DIAGNOSIS — Z87891 Personal history of nicotine dependence: Secondary | ICD-10-CM | POA: Insufficient documentation

## 2021-02-14 DIAGNOSIS — N189 Chronic kidney disease, unspecified: Secondary | ICD-10-CM | POA: Diagnosis not present

## 2021-02-14 DIAGNOSIS — N400 Enlarged prostate without lower urinary tract symptoms: Secondary | ICD-10-CM | POA: Diagnosis present

## 2021-02-14 DIAGNOSIS — E118 Type 2 diabetes mellitus with unspecified complications: Secondary | ICD-10-CM | POA: Diagnosis present

## 2021-02-14 DIAGNOSIS — I5022 Chronic systolic (congestive) heart failure: Secondary | ICD-10-CM | POA: Insufficient documentation

## 2021-02-14 LAB — CBC
HCT: 42.1 % (ref 39.0–52.0)
Hemoglobin: 14.6 g/dL (ref 13.0–17.0)
MCH: 29.3 pg (ref 26.0–34.0)
MCHC: 34.7 g/dL (ref 30.0–36.0)
MCV: 84.5 fL (ref 80.0–100.0)
Platelets: 168 10*3/uL (ref 150–400)
RBC: 4.98 MIL/uL (ref 4.22–5.81)
RDW: 13 % (ref 11.5–15.5)
WBC: 5.7 10*3/uL (ref 4.0–10.5)
nRBC: 0 % (ref 0.0–0.2)

## 2021-02-14 LAB — CBG MONITORING, ED: Glucose-Capillary: 396 mg/dL — ABNORMAL HIGH (ref 70–99)

## 2021-02-14 LAB — URINALYSIS, ROUTINE W REFLEX MICROSCOPIC
Bilirubin Urine: NEGATIVE
Glucose, UA: >1000 mg/dL — AB
Hgb urine dipstick: NEGATIVE
Ketones, ur: NEGATIVE mg/dL
Leukocytes,Ua: NEGATIVE
Nitrite: NEGATIVE
Protein, ur: NEGATIVE mg/dL
Specific Gravity, Urine: 1.027 (ref 1.005–1.030)
pH: 7 (ref 5.0–8.0)

## 2021-02-14 LAB — BASIC METABOLIC PANEL
Anion gap: 10 (ref 5–15)
BUN: 11 mg/dL (ref 8–23)
CO2: 27 mmol/L (ref 22–32)
Calcium: 9 mg/dL (ref 8.9–10.3)
Chloride: 96 mmol/L — ABNORMAL LOW (ref 98–111)
Creatinine, Ser: 0.94 mg/dL (ref 0.61–1.24)
GFR, Estimated: >60 mL/min (ref >60)
Glucose, Bld: 377 mg/dL — ABNORMAL HIGH (ref 70–99)
Potassium: 3.4 mmol/L — ABNORMAL LOW (ref 3.5–5.1)
Sodium: 133 mmol/L — ABNORMAL LOW (ref 135–145)

## 2021-02-14 LAB — RESP PANEL BY RT-PCR (FLU A&B, COVID) ARPGX2
Influenza A by PCR: NEGATIVE
Influenza B by PCR: NEGATIVE
SARS Coronavirus 2 by RT PCR: NEGATIVE

## 2021-02-14 MED ORDER — IOHEXOL 350 MG/ML SOLN
100.0000 mL | Freq: Once | INTRAVENOUS | Status: AC | PRN
  Administered 2021-02-14: 75 mL via INTRAVENOUS

## 2021-02-14 NOTE — ED Notes (Signed)
Patient returned from CT

## 2021-02-14 NOTE — ED Notes (Signed)
Called [facility] to transport patient to [facility] room 29

## 2021-02-14 NOTE — ED Notes (Signed)
Christopher [name], friend can be contacted at [phone].

## 2021-02-14 NOTE — ED Provider Notes (Signed)
[facility] EMERGENCY DEPT Provider Note   CSN: [id number] Arrival date & time: 02/14/21  1825     History Chief Complaint  Patient presents with   Dizziness    Christopher [name] is a 70 y.o. male.   Dizziness Associated symptoms: no chest pain, no palpitations, no shortness of breath and no vomiting    70 year old male with a history of CAD, atrial fibrillation not on Eliquis, HTN, HLD, DM2 who presents to the emergency department with concern for diplopia and dizziness.  The patient states that his symptoms have been going on for the past 24 hours.  He states that he has had significant double vision.  As a result of his double vision he has been unsteady on his feet.  He denies any facial droop, difficulty speaking, numbness or weakness anywhere.  Past Medical History:  Diagnosis Date   Allergy    Arthritis    Atrial fibrillation ([place])    post op, intol of anticoag   Cardiac arrest [facility])    a. s/p MDT single chamber ICD; 11-10-18- pt denies having a heart attack   Chronic kidney disease    kidney stone   Coronary artery disease    a. 2/7 Cath: LM nl, LAD min irregs, LCX min irregs, RI 40, RCA 63m EF 55-60% basal to mid inf HK, 3-4+ MR;  b. 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 Mitral valve prolapse with flail segment of posterior leaflet and severe MR by TEE, remote h/o bacterial endocarditis    Sleep apnea    NPSG 01/21/06- AHI 40.7/hr cpap   Subacute bacterial endocarditis 03/22/2008   Strep viridans   Ventricular fibrillation ([place] 11/07/2019   appropriate shock  (36J) for VF delivered    Patient Active Problem List   Diagnosis Date Noted   Diplopia 02/15/2021   GERD without esophagitis 02/15/2021   Uncontrolled type 2 diabetes mellitus with hyperglycemia, without long-term current use of insulin ([place] 02/15/2021   Vertigo 02/14/2021   Ventricular tachycardia ([place] 01/03/2020   Nonischemic cardiomyopathy ([facility] 09/27/2017   Heart valve disease 09/27/2017   History of cardiac arrest 09/27/2017   Alcohol use 05/05/2017   Coronary artery disease involving native coronary artery of native heart without angina pectoris 05/05/2017   OSA on CPAP 05/05/2017   History of repair of mitral valve 05/05/2017   Benign prostatic hyperplasia 05/16/2015   Routine general medical examination at a health care facility [id number]  Chronic systolic CHF (congestive heart failure) ([place] 10/25/2012   AF (paroxysmal atrial fibrillation) ([place] 10/25/2012   Hyperlipidemia associated with type 2 diabetes mellitus ([facility] 09/04/2012   Severe mitral regurgitation    Coronary artery disease 08/12/2012   Essential hypertension 09/27/2008   SLEEP APNEA 09/27/2008    Past Surgical History:  Procedure Laterality Date   AMPUTATION  07/28/2012   Procedure: AMPUTATION DIGIT;  Surgeon: PColin Rhein MD;  Location: [facility];  Service: Orthopedics;  Laterality: Right;  2nd toe   CARDIAC CATHETERIZATION     COLONOSCOPY     EP  IMPLANTABLE DEVICE N/A 05/01/2016   Procedure: Loop Recorder Removal;  Surgeon: Thompson Grayer, MD;  Location: [place] CV LAB;  Service: Cardiovascular;  Laterality: N/A;   FOOT SURGERY     BILATERAL TOES   ICD IMPLANT N/A 05/07/2017    Medtronic Visia AF MRI VR SureScan implanted by Dr Curt Bears following VF arrest   Implantable loop recorder placement  03/31/13   MDT Linq implanted by Dr Rayann Heman to evaluate for further afib   INTRAOPERATIVE TRANSESOPHAGEAL ECHOCARDIOGRAM N/A 09/27/2012   Procedure: INTRAOPERATIVE TRANSESOPHAGEAL ECHOCARDIOGRAM;  Surgeon:  [name], MD;  Location: [facility];  Service: Open Heart Surgery;  Laterality: N/A;   LEFT AND RIGHT HEART CATHETERIZATION WITH CORONARY ANGIOGRAM N/A 08/12/2012   Procedure: LEFT AND RIGHT HEART CATHETERIZATION WITH CORONARY ANGIOGRAM;  Surgeon: [name], MD;  Location: [facility] CATH LAB;  Service: Cardiovascular;  Laterality: N/A;   LEFT HEART CATH AND CORONARY ANGIOGRAPHY N/A 05/06/2017   Procedure: LEFT HEART CATH AND CORONARY ANGIOGRAPHY;  Surgeon: [name], MD;  Location: [place] CV LAB;  Service: Cardiovascular;  Laterality: N/A;   LOOP RECORDER IMPLANT N/A 03/31/2013   Procedure: LOOP RECORDER IMPLANT;  Surgeon: [name], MD;  Location: [place] CATH LAB;  Service: Cardiovascular;  Laterality: N/A;   MITRAL VALVE REPAIR Right 09/27/2012   Procedure: MINIMALLY INVASIVE MITRAL VALVE REPAIR (MVR);  Surgeon: [name], MD;  Location: [facility];  Service: Open Heart Surgery;  Laterality: Right;  Ultrasound guided   PERCUTANEOUS CORONARY STENT INTERVENTION (PCI-S) N/A 08/25/2012   Procedure: PERCUTANEOUS CORONARY STENT INTERVENTION (PCI-S);  Surgeon: Sherren Mocha, MD;  Location[facility] CATH LAB;  Service: Cardiovascular;  Laterality: N/A;   SEPTOPLASTY     Dr. [name]   TEE WITHOUT CARDIOVERSION N/A 08/12/2012   Procedure: TRANSESOPHAGEAL ECHOCARDIOGRAM (TEE);  Surgeon: [name], MD;  Location: [facility] ENDOSCOPY;  Service: Cardiovascular;  Laterality: N/A;   TONSILLECTOMY     UVULOPALATOPHARYNGOPLASTY         Family History  Problem Relation Age of Onset   Heart disease Father    Colon cancer Father 99   Diabetes Father    Renal cancer Father    Diabetes Mother    Stroke Mother    Sudden death Brother    Heart attack Brother    Esophageal cancer Neg Hx    Rectal cancer Neg Hx    Stomach cancer Neg Hx     Social History   Tobacco Use   Smoking status: Former    Packs/day: 2.50    Years: 5.00    Pack years: 12.50    Types: Cigarettes    Quit date: 07/06/1978     Years since quitting: 42.6   Smokeless tobacco: Never   Tobacco comments:    social drinker  Vaping Use   Vaping Use: Never used  Substance Use Topics   Alcohol use: Yes    Comment: OCC.   Drug use: No    Home Medications Prior to Admission medications   Medication Sig Start Date End Date Taking? Authorizing Provider  acetaminophen (TYLENOL) 500 MG tablet Take 500 mg by mouth every 6 (six) hours as needed for mild pain or headache.    Yes [provider]  amLODipine (NORVASC) 5 MG tablet TAKE 1 TABLET BY MOUTH  DAILY Patient taking differently: Take 5 mg by mouth daily. 09/04/20  Yes Hoyt Koch, MD  aspirin EC 81 MG tablet Take 81 mg by mouth daily.  Yes [provider]  atorvastatin (LIPITOR) 40 MG tablet TAKE 1 TABLET BY MOUTH  DAILY Patient taking differently: Take 40 mg by mouth daily. 07/22/20  Yes Hoyt Koch, MD  cetirizine (ZYRTEC) 10 MG tablet Take 10 mg by mouth daily.   Yes [provider]  cholecalciferol (VITAMIN D) 25 MCG (1000 UNIT) tablet Take 1 tablet by mouth daily.    Yes [provider]  fenofibrate (TRICOR) 145 MG tablet TAKE 1 TABLET BY MOUTH  DAILY Patient taking differently: Take 145 mg by mouth daily. 06/20/20  Yes Hoyt Koch, MD  fluticasone (FLONASE) 50 MCG/ACT nasal spray USE 2 SPRAYS IN BOTH  NOSTRILS DAILY Patient taking differently: Place 2 sprays into both nostrils daily. 11/22/20  Yes Hoyt Koch, MD  metFORMIN (GLUCOPHAGE) 1000 MG tablet TAKE 1 TABLET BY MOUTH  TWICE DAILY WITH A MEAL Patient taking differently: Take 1,000 mg by mouth 2 (two) times daily with a meal. 11/22/20  Yes Hoyt Koch, MD  metoprolol succinate (TOPROL-XL) 100 MG 24 hr tablet TAKE 1 TABLET BY MOUTH  DAILY OR IMMEDIATELY  FOLLOWING A MEAL Patient taking differently: Take 100 mg by mouth every evening. 09/10/20  Yes Hoyt Koch, MD  ramipril (ALTACE) 10 MG capsule TAKE 1 CAPSULE BY MOUTH   DAILY Patient taking differently: Take 10 mg by mouth daily. 07/22/20  Yes Hoyt Koch, MD  tamsulosin (FLOMAX) 0.4 MG CAPS capsule TAKE 1 CAPSULE BY MOUTH  DAILY Patient taking differently: Take 0.4 mg by mouth daily. 07/22/20  Yes Hoyt Koch, MD    Allergies    Codeine, Dust mite extract, and Pollen extract  Review of Systems   Review of Systems  Constitutional:  Negative for chills and fever.  HENT:  Negative for ear pain and sore throat.   Eyes:  Positive for visual disturbance. Negative for pain.  Respiratory:  Negative for cough and shortness of breath.   Cardiovascular:  Negative for chest pain and palpitations.  Gastrointestinal:  Negative for abdominal pain and vomiting.  Genitourinary:  Negative for dysuria and hematuria.  Musculoskeletal:  Negative for arthralgias and back pain.  Skin:  Negative for color change and rash.  Neurological:  Positive for dizziness. Negative for seizures and syncope.  All other systems reviewed and are negative.  Physical Exam Updated Vital Signs BP (!) 162/95 (BP Location: Left Arm)   Pulse 71   Temp 98.6 F (37 C) (Oral)   Resp 20   Ht '5\' 10"'$  (1.778 m)   Wt 104.2 kg   SpO2 96%   BMI 32.96 kg/m   Physical Exam Vitals and nursing note reviewed.  Constitutional:      General: He is not in acute distress. HENT:     Head: Normocephalic and atraumatic.  Eyes:     Conjunctiva/sclera: Conjunctivae normal.     Pupils: Pupils are equal, round, and reactive to light.  Cardiovascular:     Rate and Rhythm: Normal rate and regular rhythm.  Pulmonary:     Effort: Pulmonary effort is normal. No respiratory distress.  Abdominal:     General: There is no distension.     Tenderness: There is no guarding.  Musculoskeletal:        General: No deformity or signs of injury.     Cervical back: Normal range of motion and neck supple.  Skin:    Findings: No lesion or rash.  Neurological:     Mental Status: He is alert  and  oriented to person, place, and time.     GCS: GCS eye subscore is 4. GCS verbal subscore is 5. GCS motor subscore is 6.     Comments: Limited adduction of the left eye on rightward gaze, I will not cross midline.  No other cranial nerve deficit noted.  5 out of 5 strength all 4 extremities with intact sensation all 4 extremities.    ED Results / Procedures / Treatments   Labs (all labs ordered are listed, but only abnormal results are displayed) Labs Reviewed  BASIC METABOLIC PANEL - Abnormal; Notable for the following components:      Result Value   Sodium 133 (*)    Potassium 3.4 (*)    Chloride 96 (*)    Glucose, Bld 377 (*)    All other components within normal limits  URINALYSIS, ROUTINE W REFLEX MICROSCOPIC - Abnormal; Notable for the following components:   Glucose, UA >1,000 (*)    All other components within normal limits  GLUCOSE, CAPILLARY - Abnormal; Notable for the following components:   Glucose-Capillary 283 (*)    All other components within normal limits  LIPID PANEL - Abnormal; Notable for the following components:   Triglycerides 303 (*)    HDL 30 (*)    VLDL 61 (*)    All other components within normal limits  HEMOGLOBIN A1C - Abnormal; Notable for the following components:   Hgb A1c MFr Bld 11.5 (*)    All other components within normal limits  BASIC METABOLIC PANEL - Abnormal; Notable for the following components:   Sodium 134 (*)    Glucose, Bld 346 (*)    All other components within normal limits  GLUCOSE, CAPILLARY - Abnormal; Notable for the following components:   Glucose-Capillary 311 (*)    All other components within normal limits  GLUCOSE, CAPILLARY - Abnormal; Notable for the following components:   Glucose-Capillary 328 (*)    All other components within normal limits  CBG MONITORING, ED - Abnormal; Notable for the following components:   Glucose-Capillary 396 (*)    All other components within normal limits  RESP PANEL BY RT-PCR (FLU A&B,  COVID) ARPGX2  CBC  RAPID URINE DRUG SCREEN, HOSP PERFORMED    EKG EKG Interpretation  Date/Time:  Friday February 14 2021 18:37:21 EDT Ventricular Rate:  69 PR Interval:  208 QRS Duration: 152 QT Interval:  478 QTC Calculation: 512 R Axis:   -73 Text Interpretation: Sinus rhythm with occasional Premature ventricular complexes Right bundle branch block Left anterior fascicular block Bifasicular block Inferior infarct , age undetermined Abnormal ECG Confirmed by Regan Lemming ([id number]) on 02/15/2021 12:04:53 AM  Radiology ECHOCARDIOGRAM COMPLETE  Result Date: 02/15/2021    ECHOCARDIOGRAM REPORT   Patient Name:   SHAKI LADEWIG Date of Exam: 02/15/2021 Medical Rec #:  [id number]      Height:       70.0 in Accession #:    [id number]     Weight:       229.7 lb Date of Birth:  02-14-1951      BSA:          2.214 m Patient Age:    5 years       BP:           162/95 mmHg Patient Gender: M              HR:           69 bpm. Exam Location:  Inpatient Procedure: 2D Echo, Cardiac Doppler, Color Doppler and Intracardiac            Opacification Agent Indications:    Stroke I63.9  History:        Patient has prior history of Echocardiogram examinations, most                 recent 01/26/2020. CAD, Signs/Symptoms:Murmur; Risk Factors:Sleep                 Apnea, Hypertension, Dyslipidemia and Diabetes. Hx. of V. fib                 arrest. Chronic kidney disease.                  Mitral Valve: 30 mm prosthetic annuloplasty ring valve is                 present in the mitral position. Procedure Date: 09/27/2012.  Sonographer:    Darlina Sicilian RDCS Referring Phys: [id number] [place]  1. 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 The EF drop is new. The wall motion abnormalities are new. Findings could represent stress induced cardiomyopathy vs LAD infarction, clinical correlation is recommended. Left ventricular ejection fraction, by estimation, is 30 to 35%. The left ventricle has  moderately decreased function. The left ventricle demonstrates regional wall motion abnormalities (see scoring diagram/findings for description). The left ventricular internal cavity size was severely dilated. Left ventricular diastolic function could not be evaluated.  2. Right ventricular systolic function is normal. The right ventricular size is normal. Tricuspid regurgitation signal is inadequate for assessing PA pressure.  3. The mitral valve has been repaired/replaced. No evidence of mitral valve regurgitation. No evidence of mitral stenosis. There is a 30 mm prosthetic annuloplasty ring present in the mitral position. Procedure Date: 09/27/2012. Echo findings are consistent with normal structure and function of the mitral valve prosthesis.  4. The aortic valve is tricuspid. Aortic valve regurgitation is trivial. No aortic stenosis is present. Comparison(s): Changes from prior study are noted. EF is now reduced. New WMA described above. Conclusion(s)/Recommendation(s): No intracardiac source of embolism detected on this transthoracic study. A transesophageal echocardiogram is recommended to exclude cardiac source of embolism if clinically indicated. No left ventricular mural or apical thrombus/thrombi. 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 The EF drop is new. The wall motion abnormalities are new. Findings could represent stress induced cardiomyopathy vs LAD infarction, clinical correlation is recommended. Left ventricular ejection fraction, by estimation, is 30 to 35%. The left ventricle has moderately decreased function. The left ventricle demonstrates regional wall motion abnormalities. Definity contrast agent was given IV to delineate the left ventricular endocardial borders. The left ventricular internal cavity size was severely dilated. There is no left ventricular hypertrophy. Left ventricular diastolic function could not be evaluated due to mitral valve  repair. Left ventricular diastolic function could not be evaluated.  LV Wall Scoring: The entire apex is akinetic. Right Ventricle: The right ventricular size is normal. No increase in right ventricular wall thickness. Right ventricular systolic function is normal. Tricuspid regurgitation signal is inadequate for assessing PA pressure. Left Atrium: Left atrial size was normal in size. Right Atrium: Right atrial size was normal in size. Pericardium: Trivial pericardial effusion is present. Mitral Valve: The mitral valve has been repaired/replaced. No evidence of mitral valve regurgitation. There is a 30 mm prosthetic annuloplasty ring present in the mitral position. Procedure  Date: 09/27/2012. Echo findings are consistent with normal structure and function of the mitral valve prosthesis. No evidence of mitral valve stenosis. MV peak gradient, 9.4 mmHg. The mean mitral valve gradient is 4.5 mmHg. Tricuspid Valve: The tricuspid valve is grossly normal. Tricuspid valve regurgitation is not demonstrated. No evidence of tricuspid stenosis. Aortic Valve: The aortic valve is tricuspid. Aortic valve regurgitation is trivial. No aortic stenosis is present. Pulmonic Valve: The pulmonic valve was grossly normal. Pulmonic valve regurgitation is trivial. No evidence of pulmonic stenosis. Aorta: The aortic root and ascending aorta are structurally normal, with no evidence of dilitation. Venous: The inferior vena cava was not well visualized. IAS/Shunts: The atrial septum is grossly normal. Additional Comments: A device lead is visualized in the right atrium and right ventricle.  LEFT VENTRICLE PLAX 2D LVIDd:         6.80 cm      Diastology LVIDs:         5.40 cm      LV e' medial:    4.49 cm/s LV PW:         0.90 cm      LV E/e' medial:  19.6 LV IVS:        0.90 cm      LV e' lateral:   4.33 cm/s LVOT diam:     2.10 cm      LV E/e' lateral: 20.3 LV SV:         46 LV SV Index:   21 LVOT Area:     3.46 cm  LV Volumes (MOD) LV vol  d, MOD A2C: 169.0 ml LV vol d, MOD A4C: 259.0 ml LV vol s, MOD A2C: 110.0 ml LV vol s, MOD A4C: 143.0 ml LV SV MOD A2C:     59.0 ml LV SV MOD A4C:     259.0 ml LV SV MOD BP:      86.1 ml RIGHT VENTRICLE RV S prime:     14.10 cm/s TAPSE (M-mode): 2.2 cm LEFT ATRIUM             Index       RIGHT ATRIUM           Index LA diam:        4.20 cm 1.90 cm/m  RA Area:     13.00 cm LA Vol (A2C):   13.5 ml 6.10 ml/m  RA Volume:   27.10 ml  12.24 ml/m LA Vol (A4C):   25.5 ml 11.52 ml/m LA Biplane Vol: 20.0 ml 9.03 ml/m  AORTIC VALVE             PULMONIC VALVE LVOT Vmax:   84.70 cm/s  PV Vmax:          2.09 m/s LVOT Vmean:  57.600 cm/s PV Peak grad:     17.5 mmHg LVOT VTI:    0.133 m     PR End Diast Vel: 9.55 msec  AORTA Ao Root diam: 3.50 cm Ao Asc diam:  3.30 cm MITRAL VALVE MV Area (PHT): 1.51 cm     SHUNTS MV Area VTI:   0.97 cm     Systemic VTI:  0.13 m MV Peak grad:  9.4 mmHg     Systemic Diam: 2.10 cm MV Mean grad:  4.5 mmHg MV Vmax:       1.53 m/s MV Vmean:      99.8 cm/s MV Decel Time: 504 msec MV E velocity: 88.10 cm/s MV A velocity: 137.00 cm/s  MV E/A ratio:  0.64 Eleonore Chiquito MD Electronically signed by Eleonore Chiquito MD Signature Date/Time: 02/15/2021/3:17:36 PM    Final    CT ANGIO HEAD CODE STROKE  Result Date: 02/14/2021 CLINICAL DATA:  Initial evaluation for acute dizziness, double vision. EXAM: CT ANGIOGRAPHY HEAD AND NECK TECHNIQUE: Multidetector CT imaging of the head and neck was performed using the standard protocol during bolus administration of intravenous contrast. Multiplanar CT image reconstructions and MIPs were obtained to evaluate the vascular anatomy. Carotid stenosis measurements (when applicable) are obtained utilizing NASCET criteria, using the distal internal carotid diameter as the denominator. CONTRAST:  28m OMNIPAQUE IOHEXOL 350 MG/ML SOLN COMPARISON:  Prior CTA from 03/31/2020. FINDINGS: CT HEAD FINDINGS Brain: Cerebral volume within normal limits. Mild chronic microvascular  ischemic disease noted involving the supratentorial cerebral white matter. 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 No acute intracranial hemorrhage. No acute large vessel territory infarct. No mass lesion or mass effect. No hydrocephalus or extra-axial fluid collection. Vascular: No hyperdense vessel. Skull: Scalp soft tissues and calvarium within normal limits. Sinuses: Mild chronic right sphenoid sinusitis noted. Paranasal sinuses are otherwise clear. No mastoid effusion. Orbits: Unremarkable. Review of the MIP images confirms the above findings CTA NECK FINDINGS Aortic arch: Visualized aortic arch normal in caliber with normal branch pattern. Mild atheromatous change about the arch itself. No hemodynamically significant stenosis about the origin of the great vessels. Right carotid system: Right common and internal carotid arteries widely patent without stenosis, dissection or occlusion. Minimal plaque about the right carotid bulb without stenosis. Left carotid system: Left common and internal carotid arteries widely patent without stenosis, dissection or occlusion. Vertebral arteries: Both vertebral arteries arise from the subclavian arteries. No proximal subclavian artery stenosis. Both vertebral arteries widely patent without stenosis, dissection or occlusion. Skeleton: Mild-to-moderate multilevel cervical spondylosis without high-grade spinal stenosis. No worrisome osseous lesions. Other neck: No other acute soft tissue abnormality within the neck. No mass or adenopathy. 3 mm left thyroid nodule noted, of doubtful significance given size and patient age, no follow-up imaging recommended (ref: J Am Coll Radiol. 2015 Feb;12(2): 143-50). Upper chest: Left-sided pacemaker/AICD partially visualized. Scattered atelectatic changes with mild peribronchial thickening noted within the visualized lungs. Review of the MIP images confirms the above findings CTA HEAD FINDINGS Anterior  circulation: Both internal carotid arteries widely patent to the termini without stenosis. A1 segments widely patent. Normal anterior communicating artery complex. Both anterior cerebral arteries widely patent to their distal aspects without stenosis. No M1 stenosis or occlusion. Normal MCA bifurcations. Distal MCA branches well perfused and symmetric. Posterior circulation: Both vertebral arteries patent to the vertebrobasilar junction without stenosis. Neither PICA origin well visualized. Basilar widely patent to its distal aspect without stenosis. Superior cerebral arteries patent bilaterally. Left PCA supplied via the basilar. Right PCA supplied via a hypoplastic right P1 segment and robust right posterior communicating artery. Both PCAs perfused to their distal aspects without stenosis. Venous sinuses: Grossly patent allowing for timing the contrast bolus. Anatomic variants: None significant.  No aneurysm. Review of the MIP images confirms the above findings IMPRESSION: CT HEAD IMPRESSION: 1. No acute intracranial abnormality. 2. Mild chronic microvascular ischemic disease with small remote lacunar infarct at the genu of the right internal capsule. CTA HEAD AND NECK IMPRESSION: Normal CTA of the head and neck. No large vessel occlusion, hemodynamically significant stenosis, or other acute vascular abnormality. Electronically Signed   By: BJeannine BogaM.D.   On: 02/14/2021 20:34  CT ANGIO NECK CODE STROKE  Result Date: 02/14/2021 CLINICAL DATA:  Initial evaluation for acute dizziness, double vision. EXAM: CT ANGIOGRAPHY HEAD AND NECK TECHNIQUE: Multidetector CT imaging of the head and neck was performed using the standard protocol during bolus administration of intravenous contrast. Multiplanar CT image reconstructions and MIPs were obtained to evaluate the vascular anatomy. Carotid stenosis measurements (when applicable) are obtained utilizing NASCET criteria, using the distal internal carotid  diameter as the denominator. CONTRAST:  43m OMNIPAQUE IOHEXOL 350 MG/ML SOLN COMPARISON:  Prior CTA from 03/31/2020. FINDINGS: CT HEAD FINDINGS Brain: Cerebral volume within normal limits. Mild chronic microvascular ischemic disease noted involving the supratentorial cerebral white matter. 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 No acute intracranial hemorrhage. No acute large vessel territory infarct. No mass lesion or mass effect. No hydrocephalus or extra-axial fluid collection. Vascular: No hyperdense vessel. Skull: Scalp soft tissues and calvarium within normal limits. Sinuses: Mild chronic right sphenoid sinusitis noted. Paranasal sinuses are otherwise clear. No mastoid effusion. Orbits: Unremarkable. Review of the MIP images confirms the above findings CTA NECK FINDINGS Aortic arch: Visualized aortic arch normal in caliber with normal branch pattern. Mild atheromatous change about the arch itself. No hemodynamically significant stenosis about the origin of the great vessels. Right carotid system: Right common and internal carotid arteries widely patent without stenosis, dissection or occlusion. Minimal plaque about the right carotid bulb without stenosis. Left carotid system: Left common and internal carotid arteries widely patent without stenosis, dissection or occlusion. Vertebral arteries: Both vertebral arteries arise from the subclavian arteries. No proximal subclavian artery stenosis. Both vertebral arteries widely patent without stenosis, dissection or occlusion. Skeleton: Mild-to-moderate multilevel cervical spondylosis without high-grade spinal stenosis. No worrisome osseous lesions. Other neck: No other acute soft tissue abnormality within the neck. No mass or adenopathy. 3 mm left thyroid nodule noted, of doubtful significance given size and patient age, no follow-up imaging recommended (ref: J Am Coll Radiol. 2015 Feb;12(2): 143-50). Upper chest: Left-sided  pacemaker/AICD partially visualized. Scattered atelectatic changes with mild peribronchial thickening noted within the visualized lungs. Review of the MIP images confirms the above findings CTA HEAD FINDINGS Anterior circulation: Both internal carotid arteries widely patent to the termini without stenosis. A1 segments widely patent. Normal anterior communicating artery complex. Both anterior cerebral arteries widely patent to their distal aspects without stenosis. No M1 stenosis or occlusion. Normal MCA bifurcations. Distal MCA branches well perfused and symmetric. Posterior circulation: Both vertebral arteries patent to the vertebrobasilar junction without stenosis. Neither PICA origin well visualized. Basilar widely patent to its distal aspect without stenosis. Superior cerebral arteries patent bilaterally. Left PCA supplied via the basilar. Right PCA supplied via a hypoplastic right P1 segment and robust right posterior communicating artery. Both PCAs perfused to their distal aspects without stenosis. Venous sinuses: Grossly patent allowing for timing the contrast bolus. Anatomic variants: None significant.  No aneurysm. Review of the MIP images confirms the above findings IMPRESSION: CT HEAD IMPRESSION: 1. No acute intracranial abnormality. 2. Mild chronic microvascular ischemic disease with small remote lacunar infarct at the genu of the right internal capsule. CTA HEAD AND NECK IMPRESSION: Normal CTA of the head and neck. No large vessel occlusion, hemodynamically significant stenosis, or other acute vascular abnormality. Electronically Signed   By: BJeannine BogaM.D.   On: 02/14/2021 20:34    Procedures Procedures   Medications Ordered in ED Medications  aspirin EC tablet 81 mg (81 mg Oral Given 02/15/21 0859)  atorvastatin (  LIPITOR) tablet 40 mg (40 mg Oral Given 02/15/21 0859)  fenofibrate tablet 160 mg (160 mg Oral Given 02/15/21 0859)  metoprolol succinate (TOPROL-XL) 24 hr tablet 100 mg  (100 mg Oral Given 02/15/21 0859)  tamsulosin (FLOMAX) capsule 0.4 mg (0.4 mg Oral Given 02/15/21 0859)  loratadine (CLARITIN) tablet 10 mg (10 mg Oral Given 02/15/21 0859)  fluticasone (FLONASE) 50 MCG/ACT nasal spray 1 spray (1 spray Each Nare Patient Refused/Not Given 02/15/21 1003)  acetaminophen (TYLENOL) tablet 650 mg (has no administration in time range)    Or  acetaminophen (TYLENOL) 160 MG/5ML solution 650 mg (has no administration in time range)    Or  acetaminophen (TYLENOL) suppository 650 mg (has no administration in time range)  enoxaparin (LOVENOX) injection 40 mg (40 mg Subcutaneous Given 02/15/21 0901)  polyethylene glycol (MIRALAX / GLYCOLAX) packet 17 g (has no administration in time range)  ondansetron (ZOFRAN) injection 4 mg (has no administration in time range)  hydrALAZINE (APRESOLINE) injection 10 mg (has no administration in time range)  insulin aspart (novoLOG) injection 0-20 Units (15 Units Subcutaneous Given 02/15/21 1303)  insulin glargine-yfgn (SEMGLEE) injection 15 Units (15 Units Subcutaneous Given 02/15/21 0908)  insulin aspart (novoLOG) injection 4 Units (has no administration in time range)  perflutren lipid microspheres (DEFINITY) IV suspension (2.5 mLs Intravenous Given 02/15/21 1406)  iohexol (OMNIPAQUE) 350 MG/ML injection 100 mL (75 mLs Intravenous Contrast Given 02/14/21 1952)   stroke: mapping our early stages of recovery book ( Does not apply Given 02/15/21 0639)  potassium chloride SA (KLOR-CON) CR tablet 40 mEq (40 mEq Oral Given 02/15/21 0900)    ED Course  I have reviewed the triage vital signs and the nursing notes.  Pertinent labs & imaging results that were available during my care of the patient were reviewed by me and considered in my medical decision making (see chart for details).    MDM Rules/Calculators/A&P                            70 year old male with a history of CAD, atrial fibrillation not on Eliquis, HTN, HLD, DM2 who presents to  the emergency department with concern for diplopia and dizziness.  The patient states that his symptoms have been going on for the past 24 hours.  He states that he has had significant double vision.  As a result of his double vision he has been unsteady on his feet.  He denies any facial droop, difficulty speaking, numbness or weakness anywhere.  On exam, the patient appears to either have a cranial nerve palsy or internuclear ophthalmoplegia with limited abduction of the left eye on rightward lateral gaze.  No other cranial nerve deficits or other neurologic deficits identified.  Differential diagnosis includes acute CVA, late onset MS, diabetic cranial nerve palsy.  The patient was hyperglycemic to the 300s.  A CTA was performed which revealed no evidence of large vessel occlusion, no acute hemorrhage or evidence of subacute stroke.  Neurology was consulted in the emergency department due to concern for possible CVA.  Recommend admission to [facility] main campus for medical optimization and further work-up for possible CVA.  Hospitalist medicine was consulted for admission. Final Clinical Impression(s) / ED Diagnoses Final diagnoses:  Diplopia    Rx / DC Orders ED Discharge Orders     None        Regan Lemming, MD 02/15/21 1546

## 2021-02-14 NOTE — ED Triage Notes (Signed)
Dizziness and double vision since 10am yesterday. Denies weakness. Speech is clear.

## 2021-02-14 NOTE — ED Notes (Signed)
Patient transported to CT 

## 2021-02-15 ENCOUNTER — Encounter (HOSPITAL_COMMUNITY): Payer: Self-pay | Admitting: Internal Medicine

## 2021-02-15 ENCOUNTER — Other Ambulatory Visit: Payer: Self-pay

## 2021-02-15 ENCOUNTER — Observation Stay (HOSPITAL_BASED_OUTPATIENT_CLINIC_OR_DEPARTMENT_OTHER): Payer: Medicare Other

## 2021-02-15 DIAGNOSIS — N189 Chronic kidney disease, unspecified: Secondary | ICD-10-CM | POA: Diagnosis not present

## 2021-02-15 DIAGNOSIS — I63219 Cerebral infarction due to unspecified occlusion or stenosis of unspecified vertebral arteries: Secondary | ICD-10-CM | POA: Diagnosis not present

## 2021-02-15 DIAGNOSIS — I130 Hypertensive heart and chronic kidney disease with heart failure and stage 1 through stage 4 chronic kidney disease, or unspecified chronic kidney disease: Secondary | ICD-10-CM | POA: Diagnosis not present

## 2021-02-15 DIAGNOSIS — Z87891 Personal history of nicotine dependence: Secondary | ICD-10-CM | POA: Diagnosis not present

## 2021-02-15 DIAGNOSIS — I10 Essential (primary) hypertension: Secondary | ICD-10-CM

## 2021-02-15 DIAGNOSIS — E1165 Type 2 diabetes mellitus with hyperglycemia: Secondary | ICD-10-CM

## 2021-02-15 DIAGNOSIS — Z9989 Dependence on other enabling machines and devices: Secondary | ICD-10-CM

## 2021-02-15 DIAGNOSIS — Z9861 Coronary angioplasty status: Secondary | ICD-10-CM | POA: Diagnosis not present

## 2021-02-15 DIAGNOSIS — Z79899 Other long term (current) drug therapy: Secondary | ICD-10-CM | POA: Diagnosis not present

## 2021-02-15 DIAGNOSIS — E118 Type 2 diabetes mellitus with unspecified complications: Secondary | ICD-10-CM | POA: Diagnosis present

## 2021-02-15 DIAGNOSIS — Z7982 Long term (current) use of aspirin: Secondary | ICD-10-CM | POA: Diagnosis not present

## 2021-02-15 DIAGNOSIS — H532 Diplopia: Secondary | ICD-10-CM | POA: Diagnosis not present

## 2021-02-15 DIAGNOSIS — Z7984 Long term (current) use of oral hypoglycemic drugs: Secondary | ICD-10-CM | POA: Diagnosis not present

## 2021-02-15 DIAGNOSIS — R42 Dizziness and giddiness: Secondary | ICD-10-CM | POA: Diagnosis not present

## 2021-02-15 DIAGNOSIS — N400 Enlarged prostate without lower urinary tract symptoms: Secondary | ICD-10-CM

## 2021-02-15 DIAGNOSIS — G4733 Obstructive sleep apnea (adult) (pediatric): Secondary | ICD-10-CM | POA: Diagnosis not present

## 2021-02-15 DIAGNOSIS — E1122 Type 2 diabetes mellitus with diabetic chronic kidney disease: Secondary | ICD-10-CM | POA: Diagnosis not present

## 2021-02-15 DIAGNOSIS — I480 Paroxysmal atrial fibrillation: Secondary | ICD-10-CM | POA: Diagnosis not present

## 2021-02-15 DIAGNOSIS — K219 Gastro-esophageal reflux disease without esophagitis: Secondary | ICD-10-CM | POA: Diagnosis not present

## 2021-02-15 DIAGNOSIS — I2510 Atherosclerotic heart disease of native coronary artery without angina pectoris: Secondary | ICD-10-CM | POA: Diagnosis not present

## 2021-02-15 DIAGNOSIS — Z20822 Contact with and (suspected) exposure to covid-19: Secondary | ICD-10-CM | POA: Diagnosis not present

## 2021-02-15 DIAGNOSIS — I5022 Chronic systolic (congestive) heart failure: Secondary | ICD-10-CM | POA: Diagnosis not present

## 2021-02-15 LAB — ECHOCARDIOGRAM COMPLETE
Area-P 1/2: 1.51 cm2
Calc EF: 40.4 %
Height: 70 in
MV VTI: 0.97 cm2
S' Lateral: 5.4 cm
Single Plane A2C EF: 34.9 %
Single Plane A4C EF: 44.8 %
Weight: 3675.51 oz

## 2021-02-15 LAB — BASIC METABOLIC PANEL
Anion gap: 9 (ref 5–15)
BUN: 11 mg/dL (ref 8–23)
CO2: 24 mmol/L (ref 22–32)
Calcium: 8.9 mg/dL (ref 8.9–10.3)
Chloride: 101 mmol/L (ref 98–111)
Creatinine, Ser: 0.9 mg/dL (ref 0.61–1.24)
GFR, Estimated: >60 mL/min (ref >60)
Glucose, Bld: 346 mg/dL — ABNORMAL HIGH (ref 70–99)
Potassium: 3.5 mmol/L (ref 3.5–5.1)
Sodium: 134 mmol/L — ABNORMAL LOW (ref 135–145)

## 2021-02-15 LAB — LIPID PANEL
Cholesterol: 125 mg/dL (ref 0–200)
HDL: 30 mg/dL — ABNORMAL LOW (ref >40)
LDL Cholesterol: 34 mg/dL (ref 0–99)
Total CHOL/HDL Ratio: 4.2 RATIO
Triglycerides: 303 mg/dL — ABNORMAL HIGH (ref <150)
VLDL: 61 mg/dL — ABNORMAL HIGH (ref 0–40)

## 2021-02-15 LAB — GLUCOSE, CAPILLARY
Glucose-Capillary: 283 mg/dL — ABNORMAL HIGH (ref 70–99)
Glucose-Capillary: 311 mg/dL — ABNORMAL HIGH (ref 70–99)
Glucose-Capillary: 328 mg/dL — ABNORMAL HIGH (ref 70–99)
Glucose-Capillary: 160 mg/dL — ABNORMAL HIGH (ref 70–99)
Glucose-Capillary: 175 mg/dL — ABNORMAL HIGH (ref 70–99)

## 2021-02-15 LAB — HEMOGLOBIN A1C
Hgb A1c MFr Bld: 11.5 % — ABNORMAL HIGH (ref 4.8–5.6)
Mean Plasma Glucose: 283.35 mg/dL

## 2021-02-15 MED ORDER — LORATADINE 10 MG PO TABS
10.0000 mg | ORAL_TABLET | Freq: Every day | ORAL | Status: DC
  Administered 2021-02-15 – 2021-02-18 (×4): 10 mg via ORAL
  Filled 2021-02-15 (×4): qty 1

## 2021-02-15 MED ORDER — ATORVASTATIN CALCIUM 40 MG PO TABS
40.0000 mg | ORAL_TABLET | Freq: Every day | ORAL | Status: DC
  Administered 2021-02-15 – 2021-02-18 (×4): 40 mg via ORAL
  Filled 2021-02-15 (×4): qty 1

## 2021-02-15 MED ORDER — INSULIN ASPART 100 UNIT/ML IJ SOLN
4.0000 [IU] | Freq: Three times a day (TID) | INTRAMUSCULAR | Status: DC
  Administered 2021-02-15 – 2021-02-16 (×3): 4 [IU] via SUBCUTANEOUS

## 2021-02-15 MED ORDER — STROKE: EARLY STAGES OF RECOVERY BOOK
Freq: Once | Status: AC
  Filled 2021-02-15: qty 1

## 2021-02-15 MED ORDER — TAMSULOSIN HCL 0.4 MG PO CAPS
0.4000 mg | ORAL_CAPSULE | Freq: Every day | ORAL | Status: DC
  Administered 2021-02-15 – 2021-02-18 (×4): 0.4 mg via ORAL
  Filled 2021-02-15 (×4): qty 1

## 2021-02-15 MED ORDER — HYDRALAZINE HCL 20 MG/ML IJ SOLN: 10.0000 mg | Freq: Four times a day (QID) | INTRAMUSCULAR | Status: DC | PRN

## 2021-02-15 MED ORDER — ENOXAPARIN SODIUM 40 MG/0.4ML IJ SOSY
40.0000 mg | PREFILLED_SYRINGE | Freq: Every day | INTRAMUSCULAR | Status: DC
  Administered 2021-02-15 – 2021-02-18 (×4): 40 mg via SUBCUTANEOUS
  Filled 2021-02-15 (×4): qty 0.4

## 2021-02-15 MED ORDER — GEMFIBROZIL 600 MG PO TABS: 600.0000 mg | ORAL_TABLET | Freq: Two times a day (BID) | ORAL | Status: DC

## 2021-02-15 MED ORDER — INSULIN ASPART 100 UNIT/ML IJ SOLN
0.0000 [IU] | Freq: Three times a day (TID) | INTRAMUSCULAR | Status: DC
  Administered 2021-02-15 (×2): 15 [IU] via SUBCUTANEOUS
  Administered 2021-02-15: 4 [IU] via SUBCUTANEOUS
  Administered 2021-02-16: 15 [IU] via SUBCUTANEOUS
  Administered 2021-02-16: 7 [IU] via SUBCUTANEOUS
  Administered 2021-02-16: 15 [IU] via SUBCUTANEOUS
  Administered 2021-02-17: 4 [IU] via SUBCUTANEOUS
  Administered 2021-02-17: 7 [IU] via SUBCUTANEOUS
  Administered 2021-02-17: 11 [IU] via SUBCUTANEOUS

## 2021-02-15 MED ORDER — ACETAMINOPHEN 325 MG PO TABS: 650.0000 mg | ORAL_TABLET | ORAL | Status: DC | PRN

## 2021-02-15 MED ORDER — ACETAMINOPHEN 160 MG/5ML PO SOLN: 650.0000 mg | ORAL | Status: DC | PRN

## 2021-02-15 MED ORDER — METOPROLOL SUCCINATE ER 100 MG PO TB24
100.0000 mg | ORAL_TABLET | Freq: Every day | ORAL | Status: DC
  Administered 2021-02-15 – 2021-02-18 (×4): 100 mg via ORAL
  Filled 2021-02-15 (×4): qty 1

## 2021-02-15 MED ORDER — ASPIRIN EC 81 MG PO TBEC
81.0000 mg | DELAYED_RELEASE_TABLET | Freq: Every day | ORAL | Status: DC
  Administered 2021-02-15 – 2021-02-18 (×4): 81 mg via ORAL
  Filled 2021-02-15 (×4): qty 1

## 2021-02-15 MED ORDER — FLUTICASONE PROPIONATE 50 MCG/ACT NA SUSP
1.0000 | Freq: Every day | NASAL | Status: DC
  Administered 2021-02-16: 1 via NASAL
  Filled 2021-02-15 (×2): qty 16

## 2021-02-15 MED ORDER — POLYETHYLENE GLYCOL 3350 17 G PO PACK: 17.0000 g | PACK | Freq: Every day | ORAL | Status: DC | PRN

## 2021-02-15 MED ORDER — ONDANSETRON HCL 4 MG/2ML IJ SOLN: 4.0000 mg | Freq: Four times a day (QID) | INTRAMUSCULAR | Status: DC | PRN

## 2021-02-15 MED ORDER — PERFLUTREN LIPID MICROSPHERE
1.0000 mL | INTRAVENOUS | Status: AC | PRN
  Administered 2021-02-15: 2.5 mL via INTRAVENOUS
  Filled 2021-02-15: qty 10

## 2021-02-15 MED ORDER — FENOFIBRATE 160 MG PO TABS
160.0000 mg | ORAL_TABLET | Freq: Every day | ORAL | Status: DC
  Administered 2021-02-15 – 2021-02-18 (×4): 160 mg via ORAL
  Filled 2021-02-15 (×4): qty 1

## 2021-02-15 MED ORDER — ACETAMINOPHEN 650 MG RE SUPP: 650.0000 mg | RECTAL | Status: DC | PRN

## 2021-02-15 MED ORDER — INSULIN GLARGINE-YFGN 100 UNIT/ML ~~LOC~~ SOLN
15.0000 [IU] | Freq: Every day | SUBCUTANEOUS | Status: DC
  Administered 2021-02-15 – 2021-02-16 (×2): 15 [IU] via SUBCUTANEOUS
  Filled 2021-02-15 (×2): qty 0.15

## 2021-02-15 MED ORDER — POTASSIUM CHLORIDE CRYS ER 20 MEQ PO TBCR
40.0000 meq | EXTENDED_RELEASE_TABLET | Freq: Once | ORAL | Status: AC
  Administered 2021-02-15: 40 meq via ORAL
  Filled 2021-02-15: qty 2

## 2021-02-15 NOTE — Consult Note (Signed)
Neurology Consultation Reason for Consult: Diplopia Requesting Physician: Inda Merlin   CC: Diplopia  History is obtained from: Patient and chart review  HPI: Christopher [name] is a 70 y.o. 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  He was switched to Xarelto and developed a spontaneous thigh hematoma.  He has remained off of anticoagulation as his loop recorder and defibrillator have not detected any recurrent atrial fibrillation.   He reports that he was using his computer on Thursday morning when he felt dizzy and began to have trouble seeing with a double vision.  When he got up to stand he felt very unsteady when walking.  Otherwise he denies any focal neurological symptoms or other systemic symptoms.  He is fairly active and had worked out at [employer] over 5 miles on Wednesday  LKW: 10 AM 8/11 (Thursday) tPA given?: No, out of the window Premorbid modified rankin scale: 0-1     0 - No symptoms.     1 - No significant disability. Able to carry out all usual activities, despite some symptoms.  ROS: All other review of systems was negative except as noted in the HPI.   Past Medical History:  Diagnosis Date   Allergy    Arthritis    Atrial fibrillation ([place])    post op, intol of anticoag   Cardiac arrest [facility])    a. s/p MDT single chamber ICD; 11-10-18- pt denies having a heart attack   Chronic kidney disease    kidney stone   Coronary artery disease    a. 2/7 Cath: LM nl, LAD min irregs, LCX min irregs, RI 40, RCA 23m EF 55-60% basal to  mid inf HK, 3-4+ MR;  b. 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 Mitral valve prolapse with flail segment of posterior leaflet and severe MR by TEE, remote h/o bacterial endocarditis    Sleep apnea    NPSG 01/21/06- AHI 40.7/hr cpap   Subacute bacterial endocarditis 03/22/2008   Strep viridans   Ventricular fibrillation (HCC) 11/07/2019   appropriate shock (36J) for VF delivered   Current Outpatient Medications  Medication Instructions   acetaminophen (TYLENOL) 500 mg, Oral, Every 6 hours PRN   amLODipine (NORVASC) 5 MG tablet TAKE 1 TABLET BY MOUTH  DAILY   aspirin EC 81 mg, Daily   atorvastatin (LIPITOR) 40 MG tablet TAKE 1 TABLET BY MOUTH  DAILY   cetirizine (ZYRTEC) 10 mg, Oral, Daily   cholecalciferol (VITAMIN D) 25 MCG (1000 UNIT) tablet 1 tablet, Oral, Daily   fenofibrate (TRICOR) 145 MG tablet TAKE 1 TABLET BY MOUTH  DAILY   fluticasone (FLONASE) 50 MCG/ACT nasal spray USE 2 SPRAYS IN BOTH  NOSTRILS DAILY  metFORMIN (GLUCOPHAGE) 1000 MG tablet TAKE 1 TABLET BY MOUTH  TWICE DAILY WITH A MEAL   metoprolol succinate (TOPROL-XL) 100 MG 24 hr tablet TAKE 1 TABLET BY MOUTH  DAILY OR IMMEDIATELY  FOLLOWING A MEAL   ramipril (ALTACE) 10 MG capsule TAKE 1 CAPSULE BY MOUTH  DAILY   tamsulosin (FLOMAX) 0.4 MG CAPS capsule TAKE 1 CAPSULE BY MOUTH  DAILY     Family History  Problem Relation Age of Onset   Heart disease Father    Colon cancer Father 73   Diabetes Father    Renal cancer Father    Diabetes Mother    Stroke Mother    Sudden death Brother    Heart attack Brother    Esophageal  cancer Neg Hx    Rectal cancer Neg Hx    Stomach cancer Neg Hx     Social History:  reports that he quit smoking about 42 years ago. His smoking use included cigarettes. He has a 12.50 pack-year smoking history. He has never used smokeless tobacco. He reports current alcohol use. He reports that he does not use drugs.  Exam: Current vital signs: BP (!) 154/82 (BP Location: Right Arm)   Pulse 63   Temp 98.4 F (36.9 C) (Oral)   Resp 19   Ht '5\' 10"'$  (1.778 m)   Wt 104.2 kg   SpO2 93%   BMI 32.96 kg/m  Vital signs in last 24 hours: Temp:  [98.1 F (36.7 C)-98.9 F (37.2 C)] 98.4 F (36.9 C) (08/13 0332) Pulse Rate:  [62-68] 63 (08/13 0332) Resp:  [13-28] 19 (08/13 0332) BP: (134-174)/(78-126) 154/82 (08/13 0332) SpO2:  [93 %-99 %] 93 % (08/13 0332) Weight:  [104.2 kg-107 kg] 104.2 kg (08/13 0151)   Physical Exam  Constitutional: Appears well-developed and well-nourished.  Psych: Affect appropriate to situation, calm and cooperative Eyes: No scleral injection HENT: No oropharyngeal obstruction.  MSK: no joint deformities.  Cardiovascular: Normal rate and regular rhythm.  Respiratory: Effort normal, non-labored breathing GI: Soft.  No distension. There is no tenderness.  Skin: Warm dry and intact visible skin  Neuro: Mental Status: Patient is awake, alert, oriented to person, place, month, year, and situation. Patient is able to give a clear and coherent history No signs of aphasia or neglect Cranial Nerves: II: Visual Fields are full. Pupils are equal, round, and reactive to light.   III,IV, VI: The left eye has limited upgaze and cannot move medially (adduction).  The right eye has preserved EOM.  No monocular diplopia.  With binocular vision he has horizontal double vision worst on right gaze.  However there is no nystagmus of the right eye looking to the right and there is no preservation of convergence for adduction of the left eye V: Facial sensation is symmetric to  light touch VII: Facial movement is notable for subtle right facial droop at rest with delayed but ultimately symmetric activation, which she reports is baseline VIII: hearing is intact to voice X: Uvula elevates symmetrically XI: Shoulder shrug is symmetric. XII: tongue is midline without atrophy or fasciculations.  Motor: Tone is normal. Bulk is normal. 5/5 strength was present in all four extremities.  Sensory: Sensation is symmetric to light touch and temperature in the arms and legs. Deep Tendon Reflexes: 2+ and symmetric in the biceps and patellae.  Plantars: Toes are downgoing bilaterally.  Cerebellar: FNF and HKS are intact bilaterally Gait: Able to rise on heels and toes bilaterally, generally little more difficulty rising  on his heels.  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   Lab Results  Component Value Date   CHOL 125 02/15/2021   HDL 30 (L) 02/15/2021   LDLCALC 34 02/15/2021   LDLDIRECT 52.0 07/18/2019   TRIG 303 (H) 02/15/2021   CHOLHDL 4.2 02/15/2021   Lab Results  Component Value Date   HGBA1C 11.5 (H) 02/15/2021   ECHO 01/26/2020  1. Left ventricular ejection fraction, by estimation, is 55 to 60%. The  left ventricle has normal function. The left ventricle has no regional  wall motion abnormalities. There is mild concentric left ventricular  hypertrophy. Left ventricular diastolic  parameters are consistent with Grade II diastolic dysfunction   (pseudonormalization). Elevated left ventricular end-diastolic pressure.   2. Right ventricular systolic function is normal. The right ventricular  size is normal. Tricuspid regurgitation signal is inadequate for assessing  PA pressure.   3. The mitral valve has been repaired/replaced. Trivial mitral valve  regurgitation. Mild mitral stenosis. The mean mitral valve gradient is 4.0  mmHg.   4. The aortic valve was not well visualized. Aortic valve regurgitation  is mild. Mild aortic valve sclerosis is present, with no evidence of  aortic valve stenosis.   5. There is mild dilatation of the aortic root measuring 37 mm.   6. The inferior vena cava is normal in size with greater than 50%  respiratory variability, suggesting right atrial pressure of 3 mmHg.   Comparison(s): Changes from prior study are noted. Compared to prior  study, EF has slightly improved. Mean MV gradient has increased slightly  from 3 to 39mHg.   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  Labs are revealing for very poorly controlled diabetes although his LDL is within a good range for his cardiovascular disease.  He is fairly reluctant to try anticoagulation again, but may be willing to do so depending on MRI brain findings  Recommendations: #Possible posterior fossa stroke versus isolated peripheral partial 3rd nerve palsy - Stroke labs HgbA1c, fasting lipid panel - MRI brain w/o - Frequent neuro checks - Echocardiogram -  Continue home aspirin 81 mg daily - Hold off on Plavix pending remainder of work-up given we may try to talk to the patient again about anticoagulation - Risk factor modification - Telemetry monitoring; defibrillator interrogation for arrhythmias if possible - Blood pressure goal   -Long-term goal of normotension, normalize slowly - PT consult, OT consult, Speech consult, unless patient is back to baseline - Stroke  team to follow  Lesleigh Noe MD-PhD [facility] [phone] Available 7 PM to 7 AM, outside of these hours please call Neurologist on call as listed on Amion.

## 2021-02-15 NOTE — Progress Notes (Signed)
  Echocardiogram 2D Echocardiogram with definity has been performed.  Darlina Sicilian M 02/15/2021, 2:05 PM

## 2021-02-15 NOTE — Progress Notes (Signed)
PROGRESS NOTE    YOHANDRY SIXTO  [id number] DOB: 1950/12/24 DOA: 02/14/2021 PCP: Hoyt Koch, MD   Brief Narrative: 70 year old with past medical history significant for CAD status post DES to RCA 2014, paroxysmal A. fib not on anticoagulation due to a history of thigh  hematoma, V. 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  Evaluation in the ED CT head without contrast revealed no evidence of acute infarct.  CT angio head and neck revealed no evidence of large vessel occlusion.  COVID test negative.   Assessment & Plan:   Principal Problem:   Vertigo Active Problems:   Essential hypertension   AF (paroxysmal atrial fibrillation) (HCC)   Benign prostatic hyperplasia   Coronary artery disease involving native coronary artery of native heart without angina pectoris   OSA on CPAP   Diplopia   GERD without esophagitis   Uncontrolled type 2 diabetes mellitus with hyperglycemia, without long-term current use of insulin (HCC)   1-Vertigo and Diplopia: Concern for stroke, versus isolated 3rd neve palsy.  CT head negative for acute finding. CT head neck negative for large vessel occlusion. Plan is to get MRI, unable to get MRI until Monday due to ICD in place Appreciate neurology evaluation. Cardiology will help arrange interrogation of ICD Continue with aspirin for now. PT, OT, speech evaluation Continue with a statin, fenofibrate.  LDL 34. Triglyceride 303  2-Hypertension: Permissive  hypertension in the setting of concern for stroke  Paroxysmal A. fib: Continue with metoprolol. Her anticoagulation due to her history of thigh hematoma while on Xarelto.  CAD Continue aspirin, metoprolol, statin.  Chest pain-free.  Uncontrolled type 2 diabetes with  hyperglycemia Hemoglobin A1c: 11 Continue Gargline.  SS> add meal coverage.  Will need insulin at discharge  BPH: Continue with Flomax  OSA continue with CPAP       Estimated body mass index is 32.96 kg/m as calculated from the following:   Height as of this encounter: '5\' 10"'$  (1.778 m).   Weight as of this encounter: 104.2 kg.   DVT prophylaxis: Lovenox Code Status: Full code Family Communication: Care discussed with patient Disposition Plan:  Status is: Observation  The patient remains OBS appropriate and will d/c before 2 midnights.  Dispo: The patient is from: Home              Anticipated d/c is to: Home              Patient currently is not medically stable to d/c.   Difficult to place patient No        Consultants:  Neurology  Procedures:  ECHO  Antimicrobials:    Subjective: He is alert, denies pain.  Still having diplopia.  Vertigo improve some  Objective: Vitals:   02/15/21 0400 02/15/21 0600 02/15/21 0738 02/15/21 1204  BP: (!) 162/84 (!) 151/87 (!) 161/84 (!) 162/95  Pulse: 61 69 67 71  Resp: '19 19 20 20  '$ Temp:  98.6 F (37 C) 98.4 F (36.9 C) 98.6 F (37 C)  TempSrc:  Oral Oral Oral  SpO2: 95% 93% 95% 96%  Weight:      Height:        Intake/Output Summary (Last 24 hours) at 02/15/2021 1350 Last data filed at 02/15/2021 0859 Gross per 24 hour  Intake 240 ml  Output 450 ml  Net -210 ml   Filed Weights   02/14/21 1835 02/15/21 0151  Weight: 107 kg 104.2 kg    Examination:  General exam: Appears calm and comfortable  Respiratory system: Clear to auscultation. Respiratory effort normal. Cardiovascular system: S1 & S2 heard, RRR. No JVD, murmurs, rubs, gallops or clicks. No pedal edema. Gastrointestinal system: Abdomen is nondistended, soft and nontender. No organomegaly or masses felt. Normal bowel sounds heard. Central nervous system: Alert and oriented. Alert, follows command, motor strength 5/5  Extremities: Symmetric 5  x 5 power.   Data Reviewed: I have personally reviewed following labs and imaging studies  CBC: Recent Labs  Lab 02/14/21 1858  WBC 5.7  HGB 14.6  HCT 42.1  MCV 84.5  PLT [id number]   Basic Metabolic Panel: Recent Labs  Lab 02/14/21 1858 02/15/21 0811  NA 133* 134*  K 3.4* 3.5  CL 96* 101  CO2 27 24  GLUCOSE 377* 346*  BUN 11 11  CREATININE 0.94 0.90  CALCIUM 9.0 8.9   GFR: Estimated Creatinine Clearance: 91 mL/min (by C-G formula based on SCr of 0.9 mg/dL). Liver Function Tests: No results for input(s): AST, ALT, ALKPHOS, BILITOT, PROT, ALBUMIN in the last 168 hours. No results for input(s): LIPASE, AMYLASE in the last 168 hours. No results for input(s): AMMONIA in the last 168 hours. Coagulation Profile: No results for input(s): INR, PROTIME in the last 168 hours. Cardiac Enzymes: No results for input(s): CKTOTAL, CKMB, CKMBINDEX, TROPONINI in the last 168 hours. BNP (last 3 results) No results for input(s): PROBNP in the last 8760 hours. HbA1C: Recent Labs    02/15/21 0458  HGBA1C 11.5*   CBG: Recent Labs  Lab 02/14/21 1839 02/15/21 0144 02/15/21 0740 02/15/21 1203  GLUCAP 396* 283* 311* 328*   Lipid Profile: Recent Labs    02/15/21 0458  CHOL 125  HDL 30*  LDLCALC 34  TRIG 303*  CHOLHDL 4.2   Thyroid Function Tests: No results for input(s): TSH, T4TOTAL, FREET4, T3FREE, THYROIDAB in the last 72 hours. Anemia Panel: No results for input(s): VITAMINB12, FOLATE, FERRITIN, TIBC, IRON, RETICCTPCT in the last 72 hours. Sepsis Labs: No results for input(s): PROCALCITON, LATICACIDVEN in the last 168 hours.  Recent Results (from the past 240 hour(s))  Resp Panel by RT-PCR (Flu A&B, Covid) Nasopharyngeal Swab     Status: None   Collection Time: 02/14/21 10:16 PM   Specimen: Nasopharyngeal Swab; Nasopharyngeal(NP) swabs in vial transport medium  Result Value Ref Range Status   SARS Coronavirus 2 by RT PCR NEGATIVE NEGATIVE Final    Comment:  (NOTE) SARS-CoV-2 target nucleic acids are NOT DETECTED.  The SARS-CoV-2 RNA is generally detectable in upper respiratory specimens during the acute phase of infection. The lowest concentration of SARS-CoV-2 viral copies this assay can detect is 138 copies/mL. A negative result does not preclude SARS-Cov-2 infection and should not be used as the sole basis for treatment or other patient management decisions. A negative result may occur with  improper specimen collection/handling, submission of specimen other than nasopharyngeal swab, presence of viral mutation(s) within the areas targeted by this assay, and inadequate number of viral copies(<138 copies/mL). A negative result must be combined with clinical observations, patient history, and epidemiological information. The expected result is Negative.  Fact Sheet for Patients:  EntrepreneurPulse.com.au  Fact Sheet for Healthcare Providers:  IncredibleEmployment.be  This test is no t yet approved or cleared by the [place] FDA and  has been authorized for detection and/or diagnosis of  SARS-CoV-2 by FDA under an Emergency Use Authorization (EUA). This EUA will remain  in effect (meaning this test can be used) for the duration of the COVID-19 declaration under Section 564(b)(1) of the Act, 21 U.S.C.section 360bbb-3(b)(1), unless the authorization is terminated  or revoked sooner.       Influenza A by PCR NEGATIVE NEGATIVE Final   Influenza B by PCR NEGATIVE NEGATIVE Final    Comment: (NOTE) The Xpert Xpress SARS-CoV-2/FLU/RSV plus assay is intended as an aid in the diagnosis of influenza from Nasopharyngeal swab specimens and should not be used as a sole basis for treatment. Nasal washings and aspirates are unacceptable for Xpert Xpress SARS-CoV-2/FLU/RSV testing.  Fact Sheet for Patients: EntrepreneurPulse.com.au  Fact Sheet for Healthcare  Providers: IncredibleEmployment.be  This test is not yet approved or cleared by the [place] FDA and has been authorized for detection and/or diagnosis of SARS-CoV-2 by FDA under an Emergency Use Authorization (EUA). This EUA will remain in effect (meaning this test can be used) for the duration of the COVID-19 declaration under Section 564(b)(1) of the Act, 21 U.S.C. section 360bbb-3(b)(1), unless the authorization is terminated or revoked.  Performed at [facility], [address], [place], [place] [address]          Radiology Studies: CT ANGIO HEAD CODE STROKE  Result Date: 02/14/2021 CLINICAL DATA:  Initial evaluation for acute dizziness, double vision. EXAM: CT ANGIOGRAPHY HEAD AND NECK TECHNIQUE: Multidetector CT imaging of the head and neck was performed using the standard protocol during bolus administration of intravenous contrast. Multiplanar CT image reconstructions and MIPs were obtained to evaluate the vascular anatomy. Carotid stenosis measurements (when applicable) are obtained utilizing NASCET criteria, using the distal internal carotid diameter as the denominator. CONTRAST:  63m OMNIPAQUE IOHEXOL 350 MG/ML SOLN COMPARISON:  Prior CTA from 03/31/2020. FINDINGS: CT HEAD FINDINGS Brain: Cerebral volume within normal limits. Mild chronic microvascular ischemic disease noted involving the supratentorial cerebral white matter. 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 No acute intracranial hemorrhage. No acute large vessel territory infarct. No mass lesion or mass effect. No hydrocephalus or extra-axial fluid collection. Vascular: No hyperdense vessel. Skull: Scalp soft tissues and calvarium within normal limits. Sinuses: Mild chronic right sphenoid sinusitis noted. Paranasal sinuses are otherwise clear. No mastoid effusion. Orbits: Unremarkable. Review of the MIP images confirms the above findings CTA NECK  FINDINGS Aortic arch: Visualized aortic arch normal in caliber with normal branch pattern. Mild atheromatous change about the arch itself. No hemodynamically significant stenosis about the origin of the great vessels. Right carotid system: Right common and internal carotid arteries widely patent without stenosis, dissection or occlusion. Minimal plaque about the right carotid bulb without stenosis. Left carotid system: Left common and internal carotid arteries widely patent without stenosis, dissection or occlusion. Vertebral arteries: Both vertebral arteries arise from the subclavian arteries. No proximal subclavian artery stenosis. Both vertebral arteries widely patent without stenosis, dissection or occlusion. Skeleton: Mild-to-moderate multilevel cervical spondylosis without high-grade spinal stenosis. No worrisome osseous lesions. Other neck: No other acute soft tissue abnormality within the neck. No mass or adenopathy. 3 mm left thyroid nodule noted, of doubtful significance given size and patient age, no follow-up imaging recommended (ref: J Am Coll Radiol. 2015 Feb;12(2): 143-50). Upper chest: Left-sided pacemaker/AICD partially visualized. Scattered atelectatic changes with mild peribronchial thickening noted within the visualized lungs. Review of the MIP images confirms the above findings CTA HEAD FINDINGS Anterior circulation: Both internal carotid arteries widely  patent to the termini without stenosis. A1 segments widely patent. Normal anterior communicating artery complex. Both anterior cerebral arteries widely patent to their distal aspects without stenosis. No M1 stenosis or occlusion. Normal MCA bifurcations. Distal MCA branches well perfused and symmetric. Posterior circulation: Both vertebral arteries patent to the vertebrobasilar junction without stenosis. Neither PICA origin well visualized. Basilar widely patent to its distal aspect without stenosis. Superior cerebral arteries patent  bilaterally. Left PCA supplied via the basilar. Right PCA supplied via a hypoplastic right P1 segment and robust right posterior communicating artery. Both PCAs perfused to their distal aspects without stenosis. Venous sinuses: Grossly patent allowing for timing the contrast bolus. Anatomic variants: None significant.  No aneurysm. Review of the MIP images confirms the above findings IMPRESSION: CT HEAD IMPRESSION: 1. No acute intracranial abnormality. 2. Mild chronic microvascular ischemic disease with small remote lacunar infarct at the genu of the right internal capsule. CTA HEAD AND NECK IMPRESSION: Normal CTA of the head and neck. No large vessel occlusion, hemodynamically significant stenosis, or other acute vascular abnormality. Electronically Signed   By: Jeannine Boga M.D.   On: 02/14/2021 20:34   CT ANGIO NECK CODE STROKE  Result Date: 02/14/2021 CLINICAL DATA:  Initial evaluation for acute dizziness, double vision. EXAM: CT ANGIOGRAPHY HEAD AND NECK TECHNIQUE: Multidetector CT imaging of the head and neck was performed using the standard protocol during bolus administration of intravenous contrast. Multiplanar CT image reconstructions and MIPs were obtained to evaluate the vascular anatomy. Carotid stenosis measurements (when applicable) are obtained utilizing NASCET criteria, using the distal internal carotid diameter as the denominator. CONTRAST:  49m OMNIPAQUE IOHEXOL 350 MG/ML SOLN COMPARISON:  Prior CTA from 03/31/2020. FINDINGS: CT HEAD FINDINGS Brain: Cerebral volume within normal limits. Mild chronic microvascular ischemic disease noted involving the supratentorial cerebral white matter. 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 No acute intracranial hemorrhage. No acute large vessel territory infarct. No mass lesion or mass effect. No hydrocephalus or extra-axial fluid collection. Vascular: No hyperdense vessel. Skull: Scalp soft tissues and calvarium  within normal limits. Sinuses: Mild chronic right sphenoid sinusitis noted. Paranasal sinuses are otherwise clear. No mastoid effusion. Orbits: Unremarkable. Review of the MIP images confirms the above findings CTA NECK FINDINGS Aortic arch: Visualized aortic arch normal in caliber with normal branch pattern. Mild atheromatous change about the arch itself. No hemodynamically significant stenosis about the origin of the great vessels. Right carotid system: Right common and internal carotid arteries widely patent without stenosis, dissection or occlusion. Minimal plaque about the right carotid bulb without stenosis. Left carotid system: Left common and internal carotid arteries widely patent without stenosis, dissection or occlusion. Vertebral arteries: Both vertebral arteries arise from the subclavian arteries. No proximal subclavian artery stenosis. Both vertebral arteries widely patent without stenosis, dissection or occlusion. Skeleton: Mild-to-moderate multilevel cervical spondylosis without high-grade spinal stenosis. No worrisome osseous lesions. Other neck: No other acute soft tissue abnormality within the neck. No mass or adenopathy. 3 mm left thyroid nodule noted, of doubtful significance given size and patient age, no follow-up imaging recommended (ref: J Am Coll Radiol. 2015 Feb;12(2): 143-50). Upper chest: Left-sided pacemaker/AICD partially visualized. Scattered atelectatic changes with mild peribronchial thickening noted within the visualized lungs. Review of the MIP images confirms the above findings CTA HEAD FINDINGS Anterior circulation: Both internal carotid arteries widely patent to the termini without stenosis. A1 segments widely patent. Normal anterior communicating artery complex. Both anterior cerebral arteries widely patent to their distal  aspects without stenosis. No M1 stenosis or occlusion. Normal MCA bifurcations. Distal MCA branches well perfused and symmetric. Posterior circulation: Both  vertebral arteries patent to the vertebrobasilar junction without stenosis. Neither PICA origin well visualized. Basilar widely patent to its distal aspect without stenosis. Superior cerebral arteries patent bilaterally. Left PCA supplied via the basilar. Right PCA supplied via a hypoplastic right P1 segment and robust right posterior communicating artery. Both PCAs perfused to their distal aspects without stenosis. Venous sinuses: Grossly patent allowing for timing the contrast bolus. Anatomic variants: None significant.  No aneurysm. Review of the MIP images confirms the above findings IMPRESSION: CT HEAD IMPRESSION: 1. No acute intracranial abnormality. 2. Mild chronic microvascular ischemic disease with small remote lacunar infarct at the genu of the right internal capsule. CTA HEAD AND NECK IMPRESSION: Normal CTA of the head and neck. No large vessel occlusion, hemodynamically significant stenosis, or other acute vascular abnormality. Electronically Signed   By: Jeannine Boga M.D.   On: 02/14/2021 20:34        Scheduled Meds:  aspirin EC  81 mg Oral Daily   atorvastatin  40 mg Oral Daily   enoxaparin (LOVENOX) injection  40 mg Subcutaneous Daily   fenofibrate  160 mg Oral Daily   fluticasone  1 spray Each Nare Daily   insulin aspart  0-20 Units Subcutaneous TID WC   insulin glargine-yfgn  15 Units Subcutaneous Daily   loratadine  10 mg Oral Daily   metoprolol succinate  100 mg Oral Daily   tamsulosin  0.4 mg Oral Daily   Continuous Infusions:   LOS: 0 days    Time spent: 35 minutes.     Elmarie Shiley, MD [facility]   If 7PM-7AM, please contact night-coverage [email or web address]  02/15/2021, 1:50 PM

## 2021-02-15 NOTE — Plan of Care (Signed)

## 2021-02-15 NOTE — Evaluation (Signed)
Physical Therapy Evaluation Patient Details Name: Christopher [name] MRN: [id number] DOB: 1950/10/14 Today's Date: 02/15/2021   History of Present Illness  70 y.o. male with a past medical history significant for diabetes, hypertension, hyperlipidemia, atrial fibrillation, sleep apnea, severe mitral regurgitation s/p valvoplasty, coronary artery disease with ischemic cardiomyopathy s/p defibrillator. Admitted with diplopia and dizziness. Awaiting MRI - Possible posterior fossa stroke versus isolated peripheral partial 3rd nerve palsy  Clinical Impression  Pt admitted with above diagnosis. Demonstrates impaired balance and mild instability with gait. Pt educated on deficits noted, and is aware of change in his mobility. Denies dizziness today. Still with diplopia, notably when looking towards Rt side with palsy of Lt eye adduction. Educated on safety and recommended use of RW due to elevated fall risk. Pt currently with functional limitations due to the deficits listed below (see PT Problem List). Pt will benefit from skilled PT to increase their independence and safety with mobility to allow discharge to the venue listed below.       Follow Up Recommendations Outpatient PT (Neuro rehab for balance)    Equipment Recommendations  Rolling walker with 5" wheels    Recommendations for Other Services       Precautions / Restrictions Precautions Precautions: Fall Restrictions Weight Bearing Restrictions: No      Mobility  Bed Mobility Overal bed mobility: Independent                  Transfers Overall transfer level: Modified independent Equipment used: None             General transfer comment: Sit to stand from bed safely no assist.  Ambulation/Gait Ambulation/Gait assistance: Min assist Gait Distance (Feet): 110 Feet Assistive device: None Gait Pattern/deviations: Ataxic;Staggering right;Step-through pattern Gait velocity: wnl Gait velocity interpretation: >2.62  ft/sec, indicative of community ambulatory General Gait Details: Intermittent instability, worsened with head turns - Min assist for LOB towards right on one occasion, otherwise able to self correct for a few other instances of instability. Performed higher level dynamic challenges with some difficulty (See DGI.) Pt educated on safety and awareness of deficts, verb understanding.  Stairs            Wheelchair Mobility    Modified Rankin (Stroke Patients Only) Modified Rankin (Stroke Patients Only) Pre-Morbid Rankin Score: No symptoms Modified Rankin: Moderately severe disability (min A to ambulate)     Balance Overall balance assessment: Needs assistance Sitting-balance support: No upper extremity supported;Feet supported Sitting balance-Leahy Scale: Normal     Standing balance support: No upper extremity supported Standing balance-Leahy Scale: Good   Single Leg Stance - Right Leg: 0 (LOB min assist) Single Leg Stance - Left Leg: 3 (sec)     Rhomberg - Eyes Opened: 10 (sec) [place]omberg - Eyes Closed: 10 (sec) High level balance activites: Backward walking (WFL) High Level Balance Comments: See DGI Standardized Balance Assessment Standardized Balance Assessment : Dynamic Gait Index   Dynamic Gait Index Level Surface: Mild Impairment Change in Gait Speed: Mild Impairment Gait with Horizontal Head Turns: Moderate Impairment Gait with Vertical Head Turns: Mild Impairment Gait and Pivot Turn: Normal Step Over Obstacle: Moderate Impairment Step Around Obstacles: Mild Impairment Steps: Mild Impairment Total Score: 15       Pertinent Vitals/Pain Pain Assessment: No/denies pain    Home Living Family/patient expects to be discharged to:: Private residence Living Arrangements: Alone Available Help at Discharge: Other (Comment) (Has a girlfriend in the area but she works a lot.) Type of Home:  House (condo) Home Access: Level entry     Home Layout: One level Home  Equipment: None      Prior Function Level of Independence: Independent         Comments: Denies falls past 6 mo     Hand Dominance   Dominant Hand: Right    Extremity/Trunk Assessment   Upper Extremity Assessment Upper Extremity Assessment: Defer to OT evaluation    Lower Extremity Assessment Lower Extremity Assessment: Overall WFL for tasks assessed       Communication   Communication: No difficulties  Cognition Arousal/Alertness: Awake/alert Behavior During Therapy: WFL for tasks assessed/performed Overall Cognitive Status: Within Functional Limits for tasks assessed                                        General Comments General comments (skin integrity, edema, etc.): light touch WNL - RLE with greater instability than Lt when challenging balance.    Exercises     Assessment/Plan    PT Assessment Patient needs continued PT services  PT Problem List Decreased balance;Decreased mobility;Decreased coordination;Decreased knowledge of use of DME       PT Treatment Interventions DME instruction;Gait training;Functional mobility training;Therapeutic activities;Therapeutic exercise;Balance training;Neuromuscular re-education;Patient/family education    PT Goals (Current goals can be found in the Care Plan section)  Acute Rehab PT Goals Patient Stated Goal: Go home PT Goal Formulation: With patient Time For Goal Achievement: 03/01/21 Potential to Achieve Goals: Good    Frequency Min 4X/week   Barriers to discharge Decreased caregiver support lives alone    Co-evaluation               AM-PAC PT "6 Clicks" Mobility  Outcome Measure Help needed turning from your back to your side while in a flat bed without using bedrails?: None Help needed moving from lying on your back to sitting on the side of a flat bed without using bedrails?: None Help needed moving to and from a bed to a chair (including a wheelchair)?: None Help needed  standing up from a chair using your arms (e.g., wheelchair or bedside chair)?: None Help needed to walk in hospital room?: A Little Help needed climbing 3-5 steps with a railing? : A Little 6 Click Score: 22    End of Session Equipment Utilized During Treatment: Gait belt Activity Tolerance: Patient tolerated treatment well Patient left: in bed;with call bell/phone within reach;with bed alarm set   PT Visit Diagnosis: Unsteadiness on feet (R26.81);Other abnormalities of gait and mobility (R26.89);Ataxic gait (R26.0);Other symptoms and signs involving the nervous system (R29.898)    Time: [id number] PT Time Calculation (min) (ACUTE ONLY): 22 min   Charges:   PT Evaluation $PT Eval Low Complexity: [place], PT, DPT  Ellouise Newer 02/15/2021, 12:15 PM

## 2021-02-15 NOTE — Evaluation (Signed)
Occupational Therapy Evaluation Patient Details Name: Christopher [name] MRN: [id number] DOB: Jun 05, 1951 Today's Date: 02/15/2021    History of Present Illness 70 y.o. male with a past medical history significant for diabetes, hypertension, hyperlipidemia, atrial fibrillation, sleep apnea, severe mitral regurgitation s/p valvoplasty, coronary artery disease with ischemic cardiomyopathy s/p defibrillator. Admitted with diplopia and dizziness. Awaiting MRI - Possible posterior fossa stroke versus isolated peripheral partial 3rd nerve palsy.   Clinical Impression   Patient admitted with the above diagnosis.  PTA he was independent in all aspects of his care and mobility.  Barriers are dizziness and horizontal diplopia.  Currently he is needing generalized supervision, but no physical assist.  Fake readers and opaque tape applied to nasal side of L visual field with good effect.  Patient is no longer seeing double.  Education provided as to the purpose, and sheet provided as a reference.  OT will follow in the acute setting to ensure understanding and compliance.  MRI is scheduled for Monday.  Home with recommendations provided below.     Follow Up Recommendations  Other:  Follow up with eye doctor for further testing, and possible referral to neuro ophthalmologist.  Restrict driving until cleared by MD.   Equipment Recommendations  None recommended by OT    Recommendations for Other Services       Precautions / Restrictions Precautions Precautions: Fall Precaution Comments: Tape applied to L nasal side of vision/fake readers. Restrictions Weight Bearing Restrictions: No      Mobility Bed Mobility Overal bed mobility: Modified Independent             General bed mobility comments: use of SR and VC's to log roll slowly Patient Response: Cooperative  Transfers Overall transfer level: Needs assistance Equipment used: None Transfers: Sit to/from [facility] Sit to  Stand: Supervision Stand pivot transfers: Supervision       General transfer comment: cues to slow down and move slower    Balance Overall balance assessment: Needs assistance Sitting-balance support: No upper extremity supported;Feet supported Sitting balance-Leahy Scale: Normal     Standing balance support: No upper extremity supported Standing balance-Leahy Scale: Good                             ADL either performed or assessed with clinical judgement   ADL                                         General ADL Comments: Generalized supervision due to dizziness and double vision.     Vision Baseline Vision/History: No visual deficits Patient Visual Report: Diplopia Vision Assessment?: Yes Ocular Range of Motion: Within Functional Limits Alignment/Gaze Preference: Within Defined Limits Tracking/Visual Pursuits: Right eye does not track medially;Decreased smoothness of vertical tracking Diplopia Assessment: Objects split side to side;Present in near gaze;Present in far gaze;Present in primary gaze Depth Perception: Overshoots;Undershoots Additional Comments: Tape applied to nasal side of L visual field with good effect     Perception     Praxis      Pertinent Vitals/Pain Pain Assessment: No/denies pain     Hand Dominance Right   Extremity/Trunk Assessment Upper Extremity Assessment Upper Extremity Assessment: Overall WFL for tasks assessed   Lower Extremity Assessment Lower Extremity Assessment: Defer to PT evaluation   Cervical / Trunk Assessment Cervical / Trunk Assessment: Normal  Communication Communication Communication: No difficulties   Cognition Arousal/Alertness: Awake/alert Behavior During Therapy: WFL for tasks assessed/performed Overall Cognitive Status: Within Functional Limits for tasks assessed                                                      Home Living Family/patient expects to be  discharged to:: Private residence Living Arrangements: Alone Available Help at Discharge: Friend(s);Available PRN/intermittently Type of Home: House Home Access: Level entry     Home Layout: One level     Bathroom Shower/Tub: Occupational psychologist: Standard     Home Equipment: None          Prior Functioning/Environment Level of Independence: Independent                 OT Problem List: Impaired balance (sitting and/or standing);Impaired vision/perception      OT Treatment/Interventions: Self-care/ADL training;Balance training;Visual/perceptual remediation/compensation    OT Goals(Current goals can be found in the care plan section) Acute Rehab OT Goals Patient Stated Goal: Go home OT Goal Formulation: With patient Time For Goal Achievement: 03/01/21 Potential to Achieve Goals: Good ADL Goals Pt Will Perform Lower Body Dressing: Independently;sit to/from stand Pt Will Transfer to Toilet: Independently;ambulating Additional ADL Goal #1: Patient will demonstrate understanding/purpose of eye taping, and teach back how to remove small strips evey three days, and what to do if double vision persists, or improves.  OT Frequency: Min 2X/week   Barriers to D/C:    none noted       Co-evaluation              AM-PAC OT "6 Clicks" Daily Activity     Outcome Measure Help from another person eating meals?: None Help from another person taking care of personal grooming?: None Help from another person toileting, which includes using toliet, bedpan, or urinal?: None Help from another person bathing (including washing, rinsing, drying)?: None Help from another person to put on and taking off regular upper body clothing?: None Help from another person to put on and taking off regular lower body clothing?: None 6 Click Score: 24   End of Session Nurse Communication: Other (comment) (tape applied to glasses)  Activity Tolerance: Patient tolerated treatment  well Patient left: in bed;with call bell/phone within reach  OT Visit Diagnosis: Unsteadiness on feet (R26.81);Dizziness and giddiness (R42)                Time: [id number] OT Time Calculation (min): 25 min Charges:  OT General Charges $OT Visit: 1 Visit OT Evaluation $OT Eval Moderate Complexity: 1 Mod OT Treatments $Therapeutic Activity: 8-22 mins  02/15/2021  Christopher [name], OTR/L  Acute Rehabilitation Services  Office:  [phone]   Metta Clines 02/15/2021, 4:28 PM

## 2021-02-15 NOTE — Plan of Care (Signed)
  Problem: Education: Goal: Knowledge of General Education information will improve Description: Including pain rating scale, medication(s)/side effects and non-pharmacologic comfort measures Outcome: Progressing   Problem: Health Behavior/Discharge Planning: Goal: Ability to manage health-related needs will improve Outcome: Progressing   Problem: Clinical Measurements: Goal: Ability to maintain clinical measurements within normal limits will improve Outcome: Progressing Goal: Will remain free from infection Outcome: Progressing Goal: Diagnostic test results will improve Outcome: Progressing Goal: Respiratory complications will improve Outcome: Progressing Goal: Cardiovascular complication will be avoided Outcome: Progressing   Problem: Activity: Goal: Risk for activity intolerance will decrease Outcome: Progressing   Problem: Nutrition: Goal: Adequate nutrition will be maintained Outcome: Progressing   Problem: Coping: Goal: Level of anxiety will decrease Outcome: Progressing   Problem: Elimination: Goal: Will not experience complications related to bowel motility Outcome: Progressing Goal: Will not experience complications related to urinary retention Outcome: Progressing   Problem: Pain Managment: Goal: General experience of comfort will improve Outcome: Progressing   Problem: Safety: Goal: Ability to remain free from injury will improve Outcome: Progressing   Problem: Skin Integrity: Goal: Risk for impaired skin integrity will decrease Outcome: Progressing   Problem: Education: Goal: Knowledge of secondary prevention will improve Outcome: Progressing Goal: Knowledge of patient specific risk factors addressed and post discharge goals established will improve Outcome: Progressing Goal: Individualized Educational Video(s) Outcome: Progressing

## 2021-02-15 NOTE — H&P (Signed)
History and Physical    Christopher [name] [id number] DOB: [birth year] DOA: 02/14/2021  PCP: Hoyt Koch, MD  Patient coming from: [place]   Chief Complaint:  Chief Complaint  Patient presents with   Dizziness     HPI:    70 year old male with past medical history of coronary artery disease (S/P DES to RCA 2014, last cardiac cath 05/2017 with multivessel disease), paroxysmal atrial fibrillation (not on anticoagulation due to thigh hematoma), V. fib arrest (05/2017 now S/P ICD), obstructive sleep apnea on CPAP, gastroesophageal reflux disease, hypertension, hyperlipidemia, benign prostatic hyperplasia, diabetes mellitus type 2 and remote history of infective endocarditis complicated by mitral regurgitation status-post repair who presents to med center [facility] emergency department with complaints of vertigo and diplopia.  Patient explains that the morning of 8/12 at approximately 10 AM he was using his computer when suddenly "I could not read anything."  Patient complained of sudden onset of double vision and also felt an intense sense of vertigo "like the room was spinning" at the same time.  As the morning progressed patient noticed that he also exhibited an unsteady gait and had significant difficulty with ambulation.  Patient denies any associated headache, focal weakness, slurring of speech.  Of note, patient explains that he had similar symptoms of vertigo a year or so ago and was admitted to a different facility.  During that hospitalization patient had a negative work-up for stroke.  I do not believe that this occurred at a [place] facility.  Patient states that his symptoms persisted and at approximately 6 PM after his girlfriend got off work he asked her to bring him to [facility] emergency department for evaluation.  Upon evaluation in the emergency department, CT imaging of the head without contrast revealed no evidence of acute disease.  CT  angiogram of the head and neck revealed no evidence of large vessel occlusion.  COVID-19 testing was found to be negative.  Patient was found to be quite hyperglycemic with blood sugars as high as 377.  Review of Systems:   Review of Systems  Eyes:  Positive for blurred vision and double vision.  Neurological:  Positive for dizziness.  All other systems reviewed and are negative.  Past Medical History:  Diagnosis Date   Allergy    Arthritis    Atrial fibrillation ([place])    post op, intol of anticoag   Cardiac arrest [facility])    a. s/p MDT single chamber ICD; 11-10-18- pt denies having a heart attack   Chronic kidney disease    kidney stone   Coronary artery disease    a. 2/7 Cath: LM nl, LAD min irregs, LCX min irregs, RI 40, RCA 53m EF 55-60% basal to mid inf HK, 3-4+ MR;  b. 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 Mitral valve prolapse with flail segment of posterior leaflet and severe MR by TEE, remote h/o bacterial endocarditis    Sleep apnea    NPSG 01/21/06- AHI 40.7/hr cpap   Subacute bacterial endocarditis 03/22/2008   Strep  viridans   Ventricular fibrillation ([place]) 11/07/2019   appropriate shock (36J) for VF delivered    Past Surgical History:  Procedure Laterality Date   AMPUTATION  07/28/2012   Procedure: AMPUTATION DIGIT;  Surgeon: Colin Rhein, MD;  Location: [facility];  Service: Orthopedics;  Laterality: Right;  2nd toe   CARDIAC CATHETERIZATION     COLONOSCOPY     EP IMPLANTABLE DEVICE N/A 05/01/2016   Procedure: Loop Recorder Removal;  Surgeon: Thompson Grayer, MD;  Location: [place] CV LAB;  Service: Cardiovascular;  Laterality: N/A;   FOOT SURGERY     BILATERAL TOES   ICD IMPLANT N/A 05/07/2017    Medtronic Visia AF MRI VR SureScan implanted by Dr Curt Bears following VF arrest   Implantable loop recorder placement  03/31/13   MDT Linq implanted by Dr Rayann Heman to evaluate for further afib   INTRAOPERATIVE TRANSESOPHAGEAL ECHOCARDIOGRAM N/A 09/27/2012   Procedure: INTRAOPERATIVE TRANSESOPHAGEAL ECHOCARDIOGRAM;  Surgeon: [name], MD;  Location: [place];  Service: Open Heart Surgery;  Laterality: N/A;   LEFT AND RIGHT HEART CATHETERIZATION WITH CORONARY ANGIOGRAM N/A 08/12/2012   Procedure: LEFT AND RIGHT HEART CATHETERIZATION WITH CORONARY ANGIOGRAM;  Surgeon: [name], MD;  Location: [facility] CATH LAB;  Service: Cardiovascular;  Laterality: N/A;   LEFT HEART CATH AND CORONARY ANGIOGRAPHY N/A 05/06/2017   Procedure: LEFT HEART CATH AND CORONARY ANGIOGRAPHY;  Surgeon: [name], MD;  Location: [place] CV LAB;  Service: Cardiovascular;  Laterality: N/A;   LOOP RECORDER IMPLANT N/A 03/31/2013   Procedure: LOOP RECORDER IMPLANT;  Surgeon: [name], MD;  Location: [place] CATH LAB;  Service: Cardiovascular;  Laterality: N/A;   MITRAL VALVE REPAIR Right 09/27/2012   Procedure: MINIMALLY INVASIVE MITRAL VALVE REPAIR (MVR);  Surgeon: [name], MD;  Location: [place];  Service: Open Heart Surgery;  Laterality: Right;  Ultrasound guided   PERCUTANEOUS CORONARY STENT INTERVENTION (PCI-S) N/A 08/25/2012   Procedure: PERCUTANEOUS CORONARY STENT INTERVENTION (PCI-S);  Surgeon: Sherren Mocha, MD;  Location: [facility] C[facility] LAB;  Service: Cardiovascular;  Laterality: N/A;   SEPTOPLASTY     Dr. [name]   TEE WITHOUT CARDIOVERSION N/A 08/12/2012   Procedure: TRANSESOPHAGEAL ECHOCARDIOGRAM (TEE);  Surgeon: [name], MD;  Location: [place];  Service: Cardiovascular;  Laterality: N/A;   TONSILLECTOMY     UVULOPALATOPHARYNGOPLASTY       reports that he quit smoking  about 42 years ago. His smoking use included cigarettes. He has a 12.50 pack-year smoking history. He has never used smokeless tobacco. He reports current alcohol use. He reports that he does not use drugs.  Allergies  Allergen Reactions   Codeine     Causes bad constipation   Dust Mite Extract Cough   Pollen Extract Cough    Family History  Problem Relation Age of Onset   Heart disease Father    Colon cancer Father 22   Diabetes Father    Renal cancer Father    Diabetes Mother    Stroke Mother    Sudden death Brother    Heart attack Brother    Esophageal cancer Neg Hx    Rectal cancer Neg Hx    Stomach cancer Neg Hx      Prior to Admission medications   Medication Sig Start Date End Date Taking? Authorizing Provider  acetaminophen (TYLENOL) 500 MG tablet Take 500 mg by mouth every 6 (six) hours as needed for mild pain or headache.     [provider]  amLODipine (NORVASC) 5 MG tablet TAKE 1 TABLET BY MOUTH  DAILY 09/04/20   Hoyt Koch, MD  aspirin EC 81 MG tablet Take 81 mg by mouth daily.    [provider]  atorvastatin (LIPITOR) 40 MG tablet TAKE 1 TABLET BY MOUTH  DAILY 07/22/20   Hoyt Koch, MD  cetirizine (ZYRTEC) 10 MG tablet Take 10 mg by mouth daily.    [provider]  cholecalciferol (VITAMIN D) 25 MCG (1000 UNIT) tablet Take 1 tablet by mouth daily.     [provider]  fenofibrate (TRICOR) 145 MG tablet TAKE 1 TABLET BY MOUTH  DAILY 06/20/20   Hoyt Koch, MD  fluticasone [facility]) 50 MCG/ACT nasal spray USE 2 SPRAYS IN BOTH  NOSTRILS DAILY 11/22/20   Hoyt Koch, MD  metFORMIN (GLUCOPHAGE) 1000 MG tablet TAKE 1 TABLET BY MOUTH  TWICE DAILY WITH A MEAL 11/22/20   Hoyt Koch, MD  metoprolol succinate (TOPROL-XL) 100 MG 24 hr tablet TAKE 1 TABLET BY MOUTH  DAILY OR IMMEDIATELY  FOLLOWING A MEAL 09/10/20   Hoyt Koch, MD  ramipril (ALTACE) 10 MG capsule TAKE 1 CAPSULE BY MOUTH   DAILY 07/22/20   Hoyt Koch, MD  tamsulosin (FLOMAX) 0.4 MG CAPS capsule TAKE 1 CAPSULE BY MOUTH  DAILY 07/22/20   Hoyt Koch, MD    Physical Exam: Vitals:   02/14/21 2351 02/15/21 0045 02/15/21 0151 02/15/21 0332  BP: (!) 138/123   (!) 154/82  Pulse: 63 62  63  Resp: 16   19  Temp:   98.1 F (36.7 C) 98.4 F (36.9 C)  TempSrc:   Oral Oral  SpO2: 98% 98%  93%  Weight:   104.2 kg   Height:   '5\' 10"'$  (1.778 m)     Constitutional: Awake alert and oriented x3, no associated distress.   Skin: no rashes, no lesions, good skin turgor noted. Eyes: Pupils are equally reactive to light.  No evidence of scleral icterus or conjunctival pallor.  ENMT: Moist mucous membranes noted.  Posterior pharynx clear of any exudate or lesions.   Neck: normal, supple, no masses, no thyromegaly.  No evidence of jugular venous distension.   Respiratory: clear to auscultation bilaterally, no wheezing, no crackles. Normal respiratory effort. No accessory muscle use.  Cardiovascular: Regular rate and rhythm, no murmurs / rubs / gallops. No extremity edema. 2+ pedal pulses. No carotid bruits.  Chest:   Nontender without crepitus or deformity.   Back:   Nontender without crepitus or deformity. Abdomen: Abdomen is soft and nontender.  No evidence of intra-abdominal masses.  Positive bowel sounds noted in all quadrants.   Musculoskeletal: No joint deformity upper and lower extremities. Good ROM, no contractures. Normal muscle tone.  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  Otherwise, cranial nerves seem to be intact.  Sensation intact.  Patient moving all 4 extremities spontaneously with 5 out of 5 strength in all muscle groups.  Patient is following all commands.  Patient is responsive to verbal stimuli.   Psychiatric: Patient exhibits normal mood with appropriate affect.  Patient seems to possess insight as to their current situation.     Labs on Admission: I  have personally reviewed following labs and imaging studies -   CBC: Recent Labs  Lab 02/14/21 1858  WBC 5.7  HGB 14.6  HCT 42.1  MCV 84.5  PLT [id number]   Basic Metabolic Panel: Recent Labs  Lab 02/14/21 1858  NA 133*  K 3.4*  CL 96*  CO2 27  GLUCOSE 377*  BUN 11  CREATININE 0.94  CALCIUM 9.0   GFR: Estimated Creatinine Clearance: 87.2 mL/min (by C-G formula based on SCr of 0.94 mg/dL). Liver Function Tests: No results for input(s): AST, ALT, ALKPHOS, BILITOT, PROT, ALBUMIN in the last 168 hours. No results for input(s): LIPASE, AMYLASE in the last 168 hours. No results for input(s): AMMONIA in the last 168 hours. Coagulation Profile: No results for input(s): INR, PROTIME in the last 168 hours. Cardiac Enzymes: No results for input(s): CKTOTAL, CKMB, CKMBINDEX, TROPONINI in the last 168 hours. BNP (last 3 results) No results for input(s): PROBNP in the last 8760 hours. HbA1C: No results for input(s): HGBA1C in the last 72 hours. CBG: Recent Labs  Lab 02/14/21 1839 02/15/21 0144  GLUCAP 396* 283*   Lipid Profile: No results for input(s): CHOL, HDL, LDLCALC, TRIG, CHOLHDL, LDLDIRECT in the last 72 hours. Thyroid Function Tests: No results for input(s): TSH, T4TOTAL, FREET4, T3FREE, THYROIDAB in the last 72 hours. Anemia Panel: No results for input(s): VITAMINB12, FOLATE, FERRITIN, TIBC, IRON, RETICCTPCT in the last 72 hours. Urine analysis:    Component Value Date/Time   COLORURINE YELLOW 02/14/2021 1858   APPEARANCEUR CLEAR 02/14/2021 1858   LABSPEC 1.027 02/14/2021 1858   PHURINE 7.0 02/14/2021 1858   GLUCOSEU >1,000 (A) 02/14/2021 1858   HGBUR NEGATIVE 02/14/2021 1858   BILIRUBINUR NEGATIVE 02/14/2021 1858   BILIRUBINUR small 05/03/2013 0845   KETONESUR NEGATIVE 02/14/2021 1858   PROTEINUR NEGATIVE 02/14/2021 1858   UROBILINOGEN 0.2 05/03/2013 0845   UROBILINOGEN 1.0 09/23/2012 1515   NITRITE NEGATIVE 02/14/2021 1858   LEUKOCYTESUR NEGATIVE 02/14/2021  1858    Radiological Exams on Admission - Personally Reviewed: CT ANGIO HEAD CODE STROKE  Result Date: 02/14/2021 CLINICAL DATA:  Initial evaluation for acute dizziness, double vision. EXAM: CT ANGIOGRAPHY HEAD AND NECK TECHNIQUE: Multidetector CT imaging of the head and neck was performed using the standard protocol during bolus administration of intravenous contrast. Multiplanar CT image reconstructions and MIPs were obtained to evaluate the vascular anatomy. Carotid stenosis measurements (when applicable) are obtained utilizing NASCET criteria, using the distal internal carotid diameter as the denominator. CONTRAST:  88m OMNIPAQUE IOHEXOL 350 MG/ML SOLN COMPARISON:  Prior CTA from 03/31/2020. FINDINGS: CT HEAD FINDINGS Brain: Cerebral volume within normal limits. Mild chronic microvascular ischemic disease noted involving the supratentorial cerebral white matter. 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 No acute intracranial hemorrhage. No acute large vessel territory infarct. No mass lesion or mass effect. No hydrocephalus or extra-axial fluid collection. Vascular: No hyperdense vessel. Skull: Scalp soft tissues and calvarium within normal limits. Sinuses: Mild chronic right sphenoid sinusitis noted. Paranasal sinuses are otherwise clear. No mastoid effusion. Orbits: Unremarkable. Review of the MIP images confirms the above findings CTA NECK FINDINGS Aortic arch: Visualized aortic arch normal in caliber with normal branch pattern. Mild atheromatous change about the arch itself. No hemodynamically significant stenosis about the origin of the great vessels. Right carotid system: Right common and internal carotid arteries widely patent without stenosis, dissection or occlusion. Minimal plaque about the right carotid bulb without stenosis. Left carotid system: Left common and internal carotid arteries widely patent without stenosis, dissection or occlusion. Vertebral arteries:  Both vertebral arteries arise from the subclavian arteries. No proximal subclavian artery stenosis. Both vertebral arteries widely patent without stenosis, dissection or occlusion. Skeleton: Mild-to-moderate multilevel cervical spondylosis without high-grade  spinal stenosis. No worrisome osseous lesions. Other neck: No other acute soft tissue abnormality within the neck. No mass or adenopathy. 3 mm left thyroid nodule noted, of doubtful significance given size and patient age, no follow-up imaging recommended (ref: J Am Coll Radiol. 2015 Feb;12(2): 143-50). Upper chest: Left-sided pacemaker/AICD partially visualized. Scattered atelectatic changes with mild peribronchial thickening noted within the visualized lungs. Review of the MIP images confirms the above findings CTA HEAD FINDINGS Anterior circulation: Both internal carotid arteries widely patent to the termini without stenosis. A1 segments widely patent. Normal anterior communicating artery complex. Both anterior cerebral arteries widely patent to their distal aspects without stenosis. No M1 stenosis or occlusion. Normal MCA bifurcations. Distal MCA branches well perfused and symmetric. Posterior circulation: Both vertebral arteries patent to the vertebrobasilar junction without stenosis. Neither PICA origin well visualized. Basilar widely patent to its distal aspect without stenosis. Superior cerebral arteries patent bilaterally. Left PCA supplied via the basilar. Right PCA supplied via a hypoplastic right P1 segment and robust right posterior communicating artery. Both PCAs perfused to their distal aspects without stenosis. Venous sinuses: Grossly patent allowing for timing the contrast bolus. Anatomic variants: None significant.  No aneurysm. Review of the MIP images confirms the above findings IMPRESSION: CT HEAD IMPRESSION: 1. No acute intracranial abnormality. 2. Mild chronic microvascular ischemic disease with small remote lacunar infarct at the genu of  the right internal capsule. CTA HEAD AND NECK IMPRESSION: Normal CTA of the head and neck. No large vessel occlusion, hemodynamically significant stenosis, or other acute vascular abnormality. Electronically Signed   By: Jeannine Boga M.D.   On: 02/14/2021 20:34   CT ANGIO NECK CODE STROKE  Result Date: 02/14/2021 CLINICAL DATA:  Initial evaluation for acute dizziness, double vision. EXAM: CT ANGIOGRAPHY HEAD AND NECK TECHNIQUE: Multidetector CT imaging of the head and neck was performed using the standard protocol during bolus administration of intravenous contrast. Multiplanar CT image reconstructions and MIPs were obtained to evaluate the vascular anatomy. Carotid stenosis measurements (when applicable) are obtained utilizing NASCET criteria, using the distal internal carotid diameter as the denominator. CONTRAST:  89m OMNIPAQUE IOHEXOL 350 MG/ML SOLN COMPARISON:  Prior CTA from 03/31/2020. FINDINGS: CT HEAD FINDINGS Brain: Cerebral volume within normal limits. Mild chronic microvascular ischemic disease noted involving the supratentorial cerebral white matter. 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 No acute intracranial hemorrhage. No acute large vessel territory infarct. No mass lesion or mass effect. No hydrocephalus or extra-axial fluid collection. Vascular: No hyperdense vessel. Skull: Scalp soft tissues and calvarium within normal limits. Sinuses: Mild chronic right sphenoid sinusitis noted. Paranasal sinuses are otherwise clear. No mastoid effusion. Orbits: Unremarkable. Review of the MIP images confirms the above findings CTA NECK FINDINGS Aortic arch: Visualized aortic arch normal in caliber with normal branch pattern. Mild atheromatous change about the arch itself. No hemodynamically significant stenosis about the origin of the great vessels. Right carotid system: Right common and internal carotid arteries widely patent without stenosis, dissection or  occlusion. Minimal plaque about the right carotid bulb without stenosis. Left carotid system: Left common and internal carotid arteries widely patent without stenosis, dissection or occlusion. Vertebral arteries: Both vertebral arteries arise from the subclavian arteries. No proximal subclavian artery stenosis. Both vertebral arteries widely patent without stenosis, dissection or occlusion. Skeleton: Mild-to-moderate multilevel cervical spondylosis without high-grade spinal stenosis. No worrisome osseous lesions. Other neck: No other acute soft tissue abnormality within the neck. No mass or adenopathy. 3 mm left  thyroid nodule noted, of doubtful significance given size and patient age, no follow-up imaging recommended (ref: J Am Coll Radiol. 2015 Feb;12(2): 143-50). Upper chest: Left-sided pacemaker/AICD partially visualized. Scattered atelectatic changes with mild peribronchial thickening noted within the visualized lungs. Review of the MIP images confirms the above findings CTA HEAD FINDINGS Anterior circulation: Both internal carotid arteries widely patent to the termini without stenosis. A1 segments widely patent. Normal anterior communicating artery complex. Both anterior cerebral arteries widely patent to their distal aspects without stenosis. No M1 stenosis or occlusion. Normal MCA bifurcations. Distal MCA branches well perfused and symmetric. Posterior circulation: Both vertebral arteries patent to the vertebrobasilar junction without stenosis. Neither PICA origin well visualized. Basilar widely patent to its distal aspect without stenosis. Superior cerebral arteries patent bilaterally. Left PCA supplied via the basilar. Right PCA supplied via a hypoplastic right P1 segment and robust right posterior communicating artery. Both PCAs perfused to their distal aspects without stenosis. Venous sinuses: Grossly patent allowing for timing the contrast bolus. Anatomic variants: None significant.  No aneurysm. Review  of the MIP images confirms the above findings IMPRESSION: CT HEAD IMPRESSION: 1. No acute intracranial abnormality. 2. Mild chronic microvascular ischemic disease with small remote lacunar infarct at the genu of the right internal capsule. CTA HEAD AND NECK IMPRESSION: Normal CTA of the head and neck. No large vessel occlusion, hemodynamically significant stenosis, or other acute vascular abnormality. Electronically Signed   By: Jeannine Boga M.D.   On: 02/14/2021 20:34    EKG: Personally reviewed.  Rhythm is normal sinus rhythm with heart rate of 69 bpm.  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  No dynamic ST segment changes appreciated.  Assessment/Plan Principal Problem:   Vertigo and diplopia  Patient presenting with sudden onset of vertigo and diplopia Despite negative work-up via noncontrast CT and CTA head and neck, ongoing symptoms were discussed with neurology who recommended transfer to [facility] for continued work-up. On exam patient seems to continue to experience blurry vision with inability to adduct the left eye concerning for abducens nerve palsy and possible acute stroke. Continuing home regimen of aspirin, will discuss with neurology the possibility of initiating dual antiplatelet therapy versus full anticoagulation considering history of atrial fibrillation MRI brain without contrast ordered, will need to proceed during the day per protocol considering presence of MRI conditional device. Serial neurologic checks PT, OT, SLP evaluation Echocardiogram Monitoring patient on telemetry Interrogating ICD Continuing statin therapy, will titrate based on upcoming lipid panel  Active Problems:    Essential hypertension  With holding all antihypertensives for permissive hypertension with exception of metoprolol due to history of atrial fibrillation As needed intravenous interventions as for markedly elevated blood pressure    AF  (paroxysmal atrial fibrillation) ([place])  Patient used to be on Xarelto in the past but this was discontinued due to a large thigh hematoma Will need to decide as to whether or not full anticoagulation will need to be restarted for this patient Monitoring patient on telemetry Continue metoprolol    Coronary artery disease involving native coronary artery of native heart without angina pectoris  Currently chest pain-free Continuing aspirin Continuing metoprolol Continuing statin Monitoring patient on telemetry    Uncontrolled type 2 diabetes mellitus with hyperglycemia, without long-term current use of insulin ([place])  Patient exhibiting substantial hyperglycemia on arrival Holding oral hypoglycemics Initiating high intensity sliding scale insulin with Accu-Cheks before every meal and nightly Hemoglobin A1c pending Patient will likely need  to be transitioned to basal bolus insulin therapy    Benign prostatic hyperplasia  Continue Flomax    OSA on CPAP  CPAP nightly     Code Status:  Full code Family Communication: deferred   Status is: Observation  The patient remains OBS appropriate and will d/c before 2 midnights.  Dispo: The patient is from: Home              Anticipated d/c is to: Home              Patient currently is not medically stable to d/c.   Difficult to place patient No        Vernelle Emerald MD [facility] Pager [phone]  If 7PM-7AM, please contact night-coverage [email or web address] Use universal [place] password for that web site. If you do not have the password, please call the hospital operator.  02/15/2021, 4:45 AM

## 2021-02-16 ENCOUNTER — Encounter (HOSPITAL_COMMUNITY): Payer: Self-pay | Admitting: Internal Medicine

## 2021-02-16 DIAGNOSIS — Z9861 Coronary angioplasty status: Secondary | ICD-10-CM | POA: Diagnosis not present

## 2021-02-16 DIAGNOSIS — I2510 Atherosclerotic heart disease of native coronary artery without angina pectoris: Secondary | ICD-10-CM | POA: Diagnosis not present

## 2021-02-16 DIAGNOSIS — I5021 Acute systolic (congestive) heart failure: Secondary | ICD-10-CM

## 2021-02-16 DIAGNOSIS — Z20822 Contact with and (suspected) exposure to covid-19: Secondary | ICD-10-CM | POA: Diagnosis not present

## 2021-02-16 DIAGNOSIS — E1122 Type 2 diabetes mellitus with diabetic chronic kidney disease: Secondary | ICD-10-CM | POA: Diagnosis not present

## 2021-02-16 DIAGNOSIS — I5022 Chronic systolic (congestive) heart failure: Secondary | ICD-10-CM | POA: Diagnosis not present

## 2021-02-16 DIAGNOSIS — I480 Paroxysmal atrial fibrillation: Secondary | ICD-10-CM

## 2021-02-16 DIAGNOSIS — Z87891 Personal history of nicotine dependence: Secondary | ICD-10-CM | POA: Diagnosis not present

## 2021-02-16 DIAGNOSIS — N189 Chronic kidney disease, unspecified: Secondary | ICD-10-CM | POA: Diagnosis not present

## 2021-02-16 DIAGNOSIS — Z7982 Long term (current) use of aspirin: Secondary | ICD-10-CM | POA: Diagnosis not present

## 2021-02-16 DIAGNOSIS — R42 Dizziness and giddiness: Secondary | ICD-10-CM | POA: Diagnosis not present

## 2021-02-16 DIAGNOSIS — H532 Diplopia: Secondary | ICD-10-CM

## 2021-02-16 DIAGNOSIS — I130 Hypertensive heart and chronic kidney disease with heart failure and stage 1 through stage 4 chronic kidney disease, or unspecified chronic kidney disease: Secondary | ICD-10-CM | POA: Diagnosis not present

## 2021-02-16 DIAGNOSIS — I63219 Cerebral infarction due to unspecified occlusion or stenosis of unspecified vertebral arteries: Secondary | ICD-10-CM | POA: Diagnosis not present

## 2021-02-16 LAB — BASIC METABOLIC PANEL
Anion gap: 9 (ref 5–15)
BUN: 16 mg/dL (ref 8–23)
CO2: 27 mmol/L (ref 22–32)
Calcium: 9.5 mg/dL (ref 8.9–10.3)
Chloride: 99 mmol/L (ref 98–111)
Creatinine, Ser: 1.09 mg/dL (ref 0.61–1.24)
GFR, Estimated: >60 mL/min (ref >60)
Glucose, Bld: 242 mg/dL — ABNORMAL HIGH (ref 70–99)
Potassium: 3.9 mmol/L (ref 3.5–5.1)
Sodium: 135 mmol/L (ref 135–145)

## 2021-02-16 LAB — CBC
HCT: 41.3 % (ref 39.0–52.0)
Hemoglobin: 14.4 g/dL (ref 13.0–17.0)
MCH: 29.9 pg (ref 26.0–34.0)
MCHC: 34.9 g/dL (ref 30.0–36.0)
MCV: 85.7 fL (ref 80.0–100.0)
Platelets: 147 10*3/uL — ABNORMAL LOW (ref 150–400)
RBC: 4.82 MIL/uL (ref 4.22–5.81)
RDW: 12.9 % (ref 11.5–15.5)
WBC: 6.3 10*3/uL (ref 4.0–10.5)
nRBC: 0 % (ref 0.0–0.2)

## 2021-02-16 LAB — TROPONIN I (HIGH SENSITIVITY)
Troponin I (High Sensitivity): 19 ng/L — ABNORMAL HIGH (ref <18)
Troponin I (High Sensitivity): 17 ng/L (ref <18)

## 2021-02-16 LAB — GLUCOSE, CAPILLARY
Glucose-Capillary: 359 mg/dL — ABNORMAL HIGH (ref 70–99)
Glucose-Capillary: 214 mg/dL — ABNORMAL HIGH (ref 70–99)
Glucose-Capillary: 180 mg/dL — ABNORMAL HIGH (ref 70–99)

## 2021-02-16 MED ORDER — INSULIN GLARGINE-YFGN 100 UNIT/ML ~~LOC~~ SOLN
30.0000 [IU] | Freq: Every day | SUBCUTANEOUS | Status: DC
  Administered 2021-02-17 – 2021-02-18 (×2): 30 [IU] via SUBCUTANEOUS
  Filled 2021-02-16 (×2): qty 0.3

## 2021-02-16 MED ORDER — INSULIN GLARGINE-YFGN 100 UNIT/ML ~~LOC~~ SOLN: 20.0000 [IU] | Freq: Every day | SUBCUTANEOUS | Status: DC

## 2021-02-16 MED ORDER — INSULIN ASPART 100 UNIT/ML IJ SOLN
8.0000 [IU] | Freq: Three times a day (TID) | INTRAMUSCULAR | Status: DC
  Administered 2021-02-16 – 2021-02-18 (×6): 8 [IU] via SUBCUTANEOUS

## 2021-02-16 NOTE — Care Management Obs Status (Signed)
[facility] NOTIFICATION   Patient Details  Name: MOODY ISAAC MRN: [id number] Date of Birth: Sep 23, 1950   Medicare Observation Status Notification Given:  Yes    Bartholomew Crews, RN 02/16/2021, 12:11 PM

## 2021-02-16 NOTE — Plan of Care (Signed)
  Problem: Education: Goal: Knowledge of General Education information will improve Description: Including pain rating scale, medication(s)/side effects and non-pharmacologic comfort measures Outcome: Progressing   Problem: Health Behavior/Discharge Planning: Goal: Ability to manage health-related needs will improve Outcome: Progressing   Problem: Clinical Measurements: Goal: Ability to maintain clinical measurements within normal limits will improve Outcome: Progressing Goal: Will remain free from infection Outcome: Progressing Goal: Diagnostic test results will improve Outcome: Progressing Goal: Respiratory complications will improve Outcome: Progressing Goal: Cardiovascular complication will be avoided Outcome: Progressing   Problem: Activity: Goal: Risk for activity intolerance will decrease Outcome: Progressing   Problem: Nutrition: Goal: Adequate nutrition will be maintained Outcome: Progressing   Problem: Coping: Goal: Level of anxiety will decrease Outcome: Progressing   Problem: Elimination: Goal: Will not experience complications related to bowel motility Outcome: Progressing Goal: Will not experience complications related to urinary retention Outcome: Progressing   Problem: Pain Managment: Goal: General experience of comfort will improve Outcome: Progressing   Problem: Safety: Goal: Ability to remain free from injury will improve Outcome: Progressing   Problem: Skin Integrity: Goal: Risk for impaired skin integrity will decrease Outcome: Progressing   Problem: Education: Goal: Knowledge of secondary prevention will improve Outcome: Progressing Goal: Knowledge of patient specific risk factors addressed and post discharge goals established will improve Outcome: Progressing Goal: Individualized Educational Video(s) Outcome: Progressing

## 2021-02-16 NOTE — Consult Note (Addendum)
Cardiology Consultation:   Patient ID: LONIE FOLAND MRN: [id number]; DOB: May 25, 1951  Admit date: 02/14/2021 Date of Consult: 02/16/2021  PCP:  Hoyt Koch, MD   [facility] Providers Cardiologist:  Ena Dawley, MD (Inactive)  Electrophysiologist:  Thompson Grayer, MD       Patient Profile:   MARKEES HORNBOSTEL is a 70 y.o. male with a hx of PCI for CAD to RCA in 2014, subacute bacterial endocarditis and severe MR with repair 2014, cardiac s/p ICD, non ischemic CM with improvement to 50-55%, post op a fib, HTN, hyperlipidemia and OSA on CPAP,  last seen by Dr. Meda Coffee 07/2019 and Dr. Rayann Heman 12/2019 who is being seen 02/16/2021 for the evaluation of decreased EF on admit with CVA at the request of Dr. Tyrell Antonio.  History of Present Illness:   Mr. Lemmen with above hx and had been doing well.  In 11/2019 with VF while sitting on his couch with appropriate ICD shock. He did have syncope.   Since then no shocks and stable battery status. Hx of xarelto in past for post op a fib, but with large thigh hematoma so with no further a fib xarelto stopped.   Hx as above with cardiac cath 2018 EF 35-45%, lat ramus 50% stenosis, ramus 35% stenosis, mRCA lesions 25% . In 2019 her EF improved to 50-55%. Mild LVH, trivial AR, MV s/p MV annuloplasty.  Stable.    And in 2021 G2DD with EF 55-60% trivial MR. Aortic root 37 mm   Admitted 02/14/21 with vertigo and diplopia, unsteady gait. Glucose 377.  CT head neg acute issue.  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   EKG:  The EKG was personally reviewed and demonstrates:  SR with RBBB LAFB and PVCs  Telemetry:  Telemetry was personally reviewed and demonstrates:  SR with freq PVCs occ short NSVT burst. SR with NSVT short bursts  Na 135, k+ 3.9, glucose 242 Cr 1.09 HS troponin 19 A1c 11.5 Hgb 14.4 WBC 6.3 plts 147 LDL 34, TG 303, HDL 30   Now echo with EF 30-35%,  could represent stress induced cardiomyopathy vs LAD infarction LV demonstrates RWMA, MV with no regurg. And annuloplasty ring stable.     No chest pain, no SOB except with work out in gym and not severe enough to stop.  No shocks, no syncope, no bleeding.  BP 160/70 at 0500 and now 124/87  R 18, afebrile   Past Medical History:  Diagnosis Date   Allergy    Arthritis    Atrial fibrillation ([place])    post op, intol of anticoag   Cardiac arrest [facility])    a. s/p MDT single chamber ICD; 11-10-18- pt denies having a heart attack   Chronic kidney disease    kidney stone   Coronary artery disease    a. 2/7 Cath: LM nl, LAD min irregs, LCX min irregs, RI 40, RCA 80m EF 55-60% basal to mid inf HK, 3-4+ MR;  b. 08/25/2012 PCI of RCA with 4.0x15 Vision BMS   Diabetes mellitus without complication (HCC)    GERD (gastroesophageal reflux disease)    hx   Heart murmur    Hyperlipidemia    on statin   Hypertension    S/P mitral valve repair 09/27/2012   Complex valvuloplasty including triangular resection of flail posterior leaflet with 30 mm Sorin Memo 3D ring annuloplasty via right mini thoracotomy approach   Severe mitral  regurgitation    a. Mitral valve prolapse with flail segment of posterior leaflet and severe MR by TEE, remote h/o bacterial endocarditis    Sleep apnea    NPSG 01/21/06- AHI 40.7/hr cpap   Subacute bacterial endocarditis 03/22/2008   Strep viridans   Ventricular fibrillation ([place]) 11/07/2019   appropriate shock (36J) for VF delivered    Past Surgical History:  Procedure Laterality Date   AMPUTATION  07/28/2012   Procedure: AMPUTATION DIGIT;  Surgeon: Colin Rhein, MD;  Location: [facility];  Service: Orthopedics;  Laterality: Right;  2nd toe   CARDIAC CATHETERIZATION     COLONOSCOPY     EP IMPLANTABLE DEVICE N/A 05/01/2016   Procedure: Loop Recorder Removal;  Surgeon: Thompson Grayer, MD;  Location: [place] CV [facility];  Service: Cardiovascular;  Laterality: N/A;   FOOT SURGERY      BILATERAL TOES   ICD IMPLANT N/A 05/07/2017    Medtronic Visia AF MRI VR SureScan implanted by Dr Curt Bears following VF arrest   Implantable loop recorder placement  03/31/13   MDT Linq implanted by Dr Rayann Heman to evaluate for further afib   INTRAOPERATIVE TRANSESOPHAGEAL ECHOCARDIOGRAM N/A 09/27/2012   Procedure: INTRAOPERATIVE TRANSESOPHAGEAL ECHOCARDIOGRAM;  Surgeon: [name], MD;  Location: [place];  Service: Open Heart Surgery;  Laterality: N/A;   LEFT AND RIGHT HEART CATHETERIZATION WITH CORONARY ANGIOGRAM N/A 08/12/2012   Procedure: LEFT AND RIGHT HEART CATHETERIZATION WITH CORONARY ANGIOGRAM;  Surgeon: [name], MD;  Location: [facility] CATH LAB;  Service: Cardiovascular;  Laterality: N/A;   LEFT HEART CATH AND CORONARY ANGIOGRAPHY N/A 05/06/2017   Procedure: LEFT HEART CATH AND CORONARY ANGIOGRAPHY;  Surgeon: [name], MD;  Location: [place] [facility];  Service: Cardiovascular;  Laterality: N/A;   LOOP RECORDER IMPLANT N/A 03/31/2013   Procedure: LOOP RECORDER IMPLANT;  Surgeon: [name], MD;  Location: [place] CATH LAB;  Service: Cardiovascular;  Laterality: N/A;   MITRAL VALVE REPAIR Right 09/27/2012   Procedure: MINIMALLY INVASIVE MITRAL VALVE REPAIR (MVR);  Surgeon: [name], MD;  Location: [place];  Service: Open Heart Surgery;  Laterality: Right;  Ultrasound guided   PERCUTANEOUS CORONARY STENT INTERVENTION (PCI-S) N/A 08/25/2012   Procedure: PERCUTANEOUS CORONARY STENT INTERVENTION (PCI-S);  Surgeon: Sherren Mocha, MD;  Location: [facility] CATH LAB;  Service: Cardiovascular;  Laterality: N/A;   SEPTOPLASTY     Dr. [name]   TEE WITHOUT CARDIOVERSION N/A 08/12/2012   Procedure: TRANSESOPHAGEAL ECHOCARDIOGRAM (TEE);  Surgeon: [name], MD;  Location: [facility] ENDOSCOPY;  Service: Cardiovascular;  Laterality: N/A;   TONSILLECTOMY     UVULOPALATOPHARYNGOPLASTY       Home Medications:  Prior to Admission medications   Medication Sig Start Date End Date Taking? Authorizing  Provider  acetaminophen (TYLENOL) 500 MG tablet Take 500 mg by mouth every 6 (six) hours as needed for mild pain or headache.    Yes [provider]  amLODipine (NORVASC) 5 MG tablet TAKE 1 TABLET BY MOUTH  DAILY Patient taking differently: Take 5 mg by mouth daily. 09/04/20  Yes Hoyt Koch, MD  aspirin EC 81 MG tablet Take 81 mg by mouth daily.   Yes [provider]  atorvastatin (LIPITOR) 40 MG tablet TAKE 1 TABLET BY MOUTH  DAILY Patient taking differently: Take 40 mg by mouth daily. 07/22/20  Yes Hoyt Koch, MD  cetirizine (ZYRTEC) 10 MG tablet Take 10 mg by mouth daily.   Yes [provider]  cholecalciferol (VITAMIN D)  25 MCG (1000 UNIT) tablet Take 1 tablet by mouth daily.    Yes [provider]  fenofibrate (TRICOR) 145 MG tablet TAKE 1 TABLET BY MOUTH  DAILY Patient taking differently: Take 145 mg by mouth daily. 06/20/20  Yes Hoyt Koch, MD  fluticasone (FLONASE) 50 MCG/ACT nasal spray USE 2 SPRAYS IN BOTH  NOSTRILS DAILY Patient taking differently: Place 2 sprays into both nostrils daily. 11/22/20  Yes Hoyt Koch, MD  metFORMIN (GLUCOPHAGE) 1000 MG tablet TAKE 1 TABLET BY MOUTH  TWICE DAILY WITH A MEAL Patient taking differently: Take 1,000 mg by mouth 2 (two) times daily with a meal. 11/22/20  Yes Hoyt Koch, MD  metoprolol succinate (TOPROL-XL) 100 MG 24 hr tablet TAKE 1 TABLET BY MOUTH  DAILY OR IMMEDIATELY  FOLLOWING A MEAL Patient taking differently: Take 100 mg by mouth every evening. 09/10/20  Yes Hoyt Koch, MD  ramipril (ALTACE) 10 MG capsule TAKE 1 CAPSULE BY MOUTH  DAILY Patient taking differently: Take 10 mg by mouth daily. 07/22/20  Yes Hoyt Koch, MD  tamsulosin (FLOMAX) 0.4 MG CAPS capsule TAKE 1 CAPSULE BY MOUTH  DAILY Patient taking differently: Take 0.4 mg by mouth daily. 07/22/20  Yes Hoyt Koch, MD    Inpatient Medications: Scheduled Meds:  aspirin  EC  81 mg Oral Daily   atorvastatin  40 mg Oral Daily   enoxaparin (LOVENOX) injection  40 mg Subcutaneous Daily   fenofibrate  160 mg Oral Daily   fluticasone  1 spray Each Nare Daily   insulin aspart  0-20 Units Subcutaneous TID WC   insulin aspart  4 Units Subcutaneous TID WC   insulin glargine-yfgn  15 Units Subcutaneous Daily   loratadine  10 mg Oral Daily   metoprolol succinate  100 mg Oral Daily   tamsulosin  0.4 mg Oral Daily   Continuous Infusions:  PRN Meds: acetaminophen **OR** acetaminophen (TYLENOL) oral liquid 160 mg/5 mL **OR** acetaminophen, hydrALAZINE, ondansetron (ZOFRAN) IV, polyethylene glycol  Allergies:    Allergies  Allergen Reactions   Codeine Other (See Comments)    Causes bad constipation   Dust Mite Extract Cough   Pollen Extract Cough    Social History:   Social History   Socioeconomic History   Marital status: Divorced    Spouse name: Not on file   Number of children: Not on file   Years of education: Not on file   Highest education level: Not on file  Occupational History   Occupation: Works at [employer] Retired  Tobacco Use   Smoking status: Former    Packs/day: 2.50    Years: 5.00    Pack years: 12.50    Types: Cigarettes    Quit date: 07/06/1978    Years since quitting: 42.6   Smokeless tobacco: Never   Tobacco comments:    social drinker  Vaping Use   Vaping Use: Never used  Substance and Sexual Activity   Alcohol use: Yes    Comment: OCC.   Drug use: No   Sexual activity: Yes  Other Topics Concern   Not on file  Social History Narrative   Works at [employer] - plans to retire 07/2015 after 36 years.   Divorced, lives alone   Supportive g-friend (shirlee moore)         Social Determinants of Health   Financial Resource Strain: Low Risk    Difficulty of Paying Living Expenses: Not hard at all  Food Insecurity: No Food  Insecurity   Worried About Charity fundraiser in the Last Year: Never true   [place] in the  Last Year: Never true  Transportation Needs: No Transportation Needs   Lack of Transportation (Medical): No   Lack of Transportation (Non-Medical): No  Physical Activity: Sufficiently Active   Days of Exercise per Week: 5 days   Minutes of Exercise per Session: 30 min  Stress: No Stress Concern Present   Feeling of Stress : Not at all  Social Connections: Moderately Integrated   Frequency of Communication with Friends and Family: More than three times a week   Frequency of Social Gatherings with Friends and Family: More than three times a week   Attends Religious Services: More than 4 times per year   Active Member of [employer] or Organizations: No   Attends Music therapist: More than 4 times per year   Marital Status: Divorced  Human resources officer Violence: Not At Risk   Fear of Current or Ex-Partner: No   Emotionally Abused: No   Physically Abused: No   Sexually Abused: No    Family History:    Family History  Problem Relation Age of Onset   Heart disease Father    Colon cancer Father 63   Diabetes Father    Renal cancer Father    Diabetes Mother    Stroke Mother    Sudden death Brother    Heart attack Brother    Esophageal cancer Neg Hx    Rectal cancer Neg Hx    Stomach cancer Neg Hx      ROS:  Please see the history of present illness.  General:no colds or fevers, no weight changes Skin:no rashes or ulcers HEENT:no blurred vision, no congestion CV:see HPI PUL:see HPI GI:no diarrhea constipation or melena, no indigestion GU:no hematuria, no dysuria MS:no joint pain, no claudication Neuro:no syncope, no lightheadedness Endo:+ diabetes, no thyroid disease  All other ROS reviewed and negative.     Physical Exam/Data:   Vitals:   02/15/21 2008 02/15/21 2339 02/16/21 0504 02/16/21 0802  BP: 126/75 (!) 155/76 (!) 160/70 124/87  Pulse: 62 62 60 71  Resp: '17 17 17 18  '$ Temp: 98.8 F (37.1 C) 98.3 F (36.8 C) 98.9 F (37.2 C) 98.1 F (36.7 C)   TempSrc: Oral Oral Axillary Oral  SpO2: 94% 96% 92% 95%  Weight:   104 kg   Height:        Intake/Output Summary (Last 24 hours) at 02/16/2021 1000 Last data filed at 02/16/2021 0802 Gross per 24 hour  Intake 240 ml  Output 350 ml  Net -110 ml   Last 3 Weights 02/16/2021 02/15/2021 02/14/2021  Weight (lbs) 229 lb 4.5 oz 229 lb 11.5 oz 236 lb  Weight (kg) 104 kg 104.2 kg 107.049 kg     Body mass index is 32.9 kg/m.  General:  Well nourished, well developed, in no acute distress HEENT: normal Lymph: no adenopathy Neck: no JVD Endocrine:  No thryomegaly Vascular: No carotid bruits; pedal pulses 1+ bilaterally   Cardiac:  normal S1, S2; RRR; no murmur gallup or rub  Lungs:  clear to auscultation bilaterally, no wheezing, rhonchi or rales  Abd: soft, nontender, no hepatomegaly  Ext: no edema Musculoskeletal:  No deformities, BUE and BLE strength normal and equal Skin: warm and dry  Neuro:  alert and oriented X 3 MAE + double vision,  Psych:  Normal affect   Relevant CV Studies: ECHO 02/15/21 IMPRESSIONS  1. 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 The EF drop is  new. The wall motion abnormalities are new. Findings could represent  stress induced cardiomyopathy vs LAD infarction, clinical correlation is  recommended. Left ventricular  ejection fraction, by estimation, is 30 to 35%. The left ventricle has  moderately decreased function. The left ventricle demonstrates regional  wall motion abnormalities (see scoring diagram/findings for description).  The left ventricular internal cavity  size was severely dilated. Left ventricular diastolic function could not  be evaluated.   2. Right ventricular systolic function is normal. The right ventricular  size is normal. Tricuspid regurgitation signal is inadequate for assessing  PA pressure.   3. The mitral valve has been repaired/replaced. No evidence of mitral  valve regurgitation. No evidence of mitral  stenosis. There is a 30 mm  prosthetic annuloplasty ring present in the mitral position. Procedure  Date: 09/27/2012. Echo findings are  consistent with normal structure and function of the mitral valve  prosthesis.   4. The aortic valve is tricuspid. Aortic valve regurgitation is trivial.  No aortic stenosis is present.   Comparison(s): Changes from prior study are noted. EF is now reduced. New  WMA described above.   Conclusion(s)/Recommendation(s): No intracardiac source of embolism  detected on this transthoracic study. A transesophageal echocardiogram is  recommended to exclude cardiac source of embolism if clinically indicated.  No left ventricular mural or apical  thrombus/thrombi.   FINDINGS   Left Ventricle: LVEF is moderately reduced the entire apex is akinetic.  The EF drop is new. The wall motion abnormalities are new. Findings could  represent stress induced cardiomyopathy vs LAD infarction, clinical  correlation is recommended. Left  ventricular ejection fraction, by estimation, is 30 to 35%. The left  ventricle has moderately decreased function. The left ventricle  demonstrates regional wall motion abnormalities. Definity contrast agent  was given IV to delineate the left ventricular  endocardial borders. The left ventricular internal cavity size was  severely dilated. There is no left ventricular hypertrophy. Left  ventricular diastolic function could not be evaluated due to mitral valve  repair. Left ventricular diastolic function  could not be evaluated.      LV Wall Scoring:  The entire apex is akinetic.   Right Ventricle: The right ventricular size is normal. No increase in  right ventricular wall thickness. Right ventricular systolic function is  normal. Tricuspid regurgitation signal is inadequate for assessing PA  pressure.   Left Atrium: Left atrial size was normal in size.   Right Atrium: Right atrial size was normal in size.   Pericardium:  Trivial pericardial effusion is present.   Mitral Valve: The mitral valve has been repaired/replaced. No evidence of  mitral valve regurgitation. There is a 30 mm prosthetic annuloplasty ring  present in the mitral position. Procedure Date: 09/27/2012. Echo findings  are consistent with normal  structure and function of the mitral valve prosthesis. No evidence of  mitral valve stenosis. MV peak gradient, 9.4 mmHg. The mean mitral valve  gradient is 4.5 mmHg.   Tricuspid Valve: The tricuspid valve is grossly normal. Tricuspid valve  regurgitation is not demonstrated. No evidence of tricuspid stenosis.   Aortic Valve: The aortic valve is tricuspid. Aortic valve regurgitation is  trivial. No aortic stenosis is present.   Pulmonic Valve: The pulmonic valve was grossly normal. Pulmonic valve  regurgitation is trivial. No evidence of pulmonic stenosis.   Aorta: The aortic root and ascending aorta are structurally normal, with  no evidence of dilitation.   Venous: The inferior vena cava was not well visualized.   IAS/Shunts: The atrial septum is grossly normal.   Additional Comments: A device lead is visualized in the right atrium and  right ventricle.   ECHO 01/25/21 IMPRESSIONS     1. Left ventricular ejection fraction, by estimation, is 55 to 60%. The  left ventricle has normal function. The left ventricle has no regional  wall motion abnormalities. There is mild concentric left ventricular  hypertrophy. Left ventricular diastolic  parameters are consistent with Grade II diastolic dysfunction  (pseudonormalization). Elevated left ventricular end-diastolic pressure.   2. Right ventricular systolic function is normal. The right ventricular  size is normal. Tricuspid regurgitation signal is inadequate for assessing  PA pressure.   3. The mitral valve has been repaired/replaced. Trivial mitral valve  regurgitation. Mild mitral stenosis. The mean mitral valve gradient is 4.0  mmHg.    4. The aortic valve was not well visualized. Aortic valve regurgitation  is mild. Mild aortic valve sclerosis is present, with no evidence of  aortic valve stenosis.   5. There is mild dilatation of the aortic root measuring 37 mm.   6. The inferior vena cava is normal in size with greater than 50%  respiratory variability, suggesting right atrial pressure of 3 mmHg.   Comparison(s): Changes from prior study are noted. Compared to prior  study, EF has slightly improved. Mean MV gradient has increased slightly  from 3 to 80mHg.   FINDINGS   Left Ventricle: Left ventricular ejection fraction, by estimation, is 55  to 60%. The left ventricle has normal function. The left ventricle has no  regional wall motion abnormalities. The left ventricular internal cavity  size was normal in size. There is   mild concentric left ventricular hypertrophy. Left ventricular diastolic  parameters are consistent with Grade II diastolic dysfunction  (pseudonormalization). Elevated left ventricular end-diastolic pressure.   Right Ventricle: The right ventricular size is normal. No increase in  right ventricular wall thickness. Right ventricular systolic function is  normal. Tricuspid regurgitation signal is inadequate for assessing PA  pressure.   Left Atrium: Left atrial size was normal in size.   Right Atrium: Right atrial size was normal in size.   Pericardium: There is no evidence of pericardial effusion.   Mitral Valve: The mitral valve has been repaired/replaced. Normal mobility  of the mitral valve leaflets. Trivial mitral valve regurgitation. There is  a 30 mm Sorin Mem 3D prosthetic annuloplasty ring present in the mitral  position. Mild mitral valve  stenosis. MV peak gradient, 7.3 mmHg. The mean mitral valve gradient is  4.0 mmHg.   Tricuspid Valve: The tricuspid valve is normal in structure. Tricuspid  valve regurgitation is not demonstrated. No evidence of tricuspid  stenosis.    Aortic Valve: The aortic valve was not well visualized. Aortic valve  regurgitation is mild. Mild aortic valve sclerosis is present, with no  evidence of aortic valve stenosis.   Pulmonic Valve: The pulmonic valve was normal in structure. Pulmonic valve  regurgitation is trivial. No evidence of pulmonic stenosis.   Aorta: The aortic root is normal in size and structure. There is mild  dilatation of the aortic root measuring 37 mm.    Cardiac cath 05/06/17 There is moderate left ventricular systolic dysfunction. LV end diastolic pressure is severely elevated. The left ventricular ejection fraction is 35-45% by visual estimate. Lat Ramus lesion, 50 %stenosed. Ramus lesion, 35 %stenosed. Mid RCA lesion, 25 %  stenosed. Mid RCA to Dist RCA lesion, 0 %stenosed.  Flowsheet Row Most Recent Value  AO Systolic Pressure 0000000 mmHg  AO Diastolic Pressure 79 mmHg  AO Mean [id number] mmHg  LV Systolic Pressure [id number] mmHg  LV Diastolic Pressure 13 mmHg  LV EDP 30 mmHg  AOp Systolic Pressure 123456 mmHg  AOp Diastolic Pressure 78 mmHg  AOp Mean Pressure 123456 mmHg  LVp Systolic Pressure [id number] mmHg  LVp Diastolic Pressure 9 mmHg  LVp EDP Pressure 20 mmHg   Diagnostic Dominance: Right   Laboratory Data:  High Sensitivity Troponin:   Recent Labs  Lab 02/16/21 0736  TROPONINIHS 19*     Chemistry Recent Labs  Lab 02/14/21 1858 02/15/21 0811 02/16/21 0033  NA 133* 134* 135  K 3.4* 3.5 3.9  CL 96* 101 99  CO2 '27 24 27  '$ GLUCOSE 377* 346* 242*  BUN '11 11 16  '$ CREATININE 0.94 0.90 1.09  CALCIUM 9.0 8.9 9.5  GFRNONAA >60 >60 >60  ANIONGAP '10 9 9    '$ No results for input(s): PROT, ALBUMIN, AST, ALT, ALKPHOS, BILITOT in the last 168 hours. Hematology Recent Labs  Lab 02/14/21 1858 02/16/21 0033  WBC 5.7 6.3  RBC 4.98 4.82  HGB 14.6 14.4  HCT 42.1 41.3  MCV 84.5 85.7  MCH 29.3 29.9  MCHC 34.7 34.9  RDW 13.0 12.9  PLT 168 147*   BNPNo results for input(s): BNP, PROBNP in the last 168  hours.  DDimer No results for input(s): DDIMER in the last 168 hours.   Radiology/Studies:  ECHOCARDIOGRAM COMPLETE  Result Date: 02/15/2021    ECHOCARDIOGRAM REPORT   Patient Name:   DONYEL KRAHL Date of Exam: 02/15/2021 Medical Rec #:  [id number]      Height:       70.0 in Accession #:    [id number]     Weight:       229.7 lb Date of Birth:  [birth year]      BSA:          2.214 m Patient Age:    49 years       BP:           162/95 mmHg Patient Gender: M              HR:           69 bpm. Exam Location:  Inpatient Procedure: 2D Echo, Cardiac Doppler, Color Doppler and Intracardiac            Opacification Agent Indications:    Stroke I63.9  History:        Patient has prior history of Echocardiogram examinations, most                 recent 01/26/2020. CAD, Signs/Symptoms:Murmur; Risk Factors:Sleep                 Apnea, Hypertension, Dyslipidemia and Diabetes. Hx. of V. fib                 arrest. Chronic kidney disease.                  Mitral Valve: 30 mm prosthetic annuloplasty ring valve is                 present in the mitral position. Procedure Date: 09/27/2012.  Sonographer:    Darlina Sicilian RDCS Referring Phys: [id number] [place]  1. 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 The EF  drop is new. The wall motion abnormalities are new. Findings could represent stress induced cardiomyopathy vs LAD infarction, clinical correlation is recommended. Left ventricular ejection fraction, by estimation, is 30 to 35%. The left ventricle has moderately decreased function. The left ventricle demonstrates regional wall motion abnormalities (see scoring diagram/findings for description). The left ventricular internal cavity size was severely dilated. Left ventricular diastolic function could not be evaluated.  2. Right ventricular systolic function is normal. The right ventricular size is normal. Tricuspid regurgitation signal is inadequate for assessing PA pressure.  3. The mitral  valve has been repaired/replaced. No evidence of mitral valve regurgitation. No evidence of mitral stenosis. There is a 30 mm prosthetic annuloplasty ring present in the mitral position. Procedure Date: 09/27/2012. Echo findings are consistent with normal structure and function of the mitral valve prosthesis.  4. The aortic valve is tricuspid. Aortic valve regurgitation is trivial. No aortic stenosis is present. Comparison(s): Changes from prior study are noted. EF is now reduced. New WMA described above. Conclusion(s)/Recommendation(s): No intracardiac source of embolism detected on this transthoracic study. A transesophageal echocardiogram is recommended to exclude cardiac source of embolism if clinically indicated. No left ventricular mural or apical thrombus/thrombi. 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 The EF drop is new. The wall motion abnormalities are new. Findings could represent stress induced cardiomyopathy vs LAD infarction, clinical correlation is recommended. Left ventricular ejection fraction, by estimation, is 30 to 35%. The left ventricle has moderately decreased function. The left ventricle demonstrates regional wall motion abnormalities. Definity contrast agent was given IV to delineate the left ventricular endocardial borders. The left ventricular internal cavity size was severely dilated. There is no left ventricular hypertrophy. Left ventricular diastolic function could not be evaluated due to mitral valve repair. Left ventricular diastolic function could not be evaluated.  LV Wall Scoring: The entire apex is akinetic. Right Ventricle: The right ventricular size is normal. No increase in right ventricular wall thickness. Right ventricular systolic function is normal. Tricuspid regurgitation signal is inadequate for assessing PA pressure. Left Atrium: Left atrial size was normal in size. Right Atrium: Right atrial size was normal in size.  Pericardium: Trivial pericardial effusion is present. Mitral Valve: The mitral valve has been repaired/replaced. No evidence of mitral valve regurgitation. There is a 30 mm prosthetic annuloplasty ring present in the mitral position. Procedure Date: 09/27/2012. Echo findings are consistent with normal structure and function of the mitral valve prosthesis. No evidence of mitral valve stenosis. MV peak gradient, 9.4 mmHg. The mean mitral valve gradient is 4.5 mmHg. Tricuspid Valve: The tricuspid valve is grossly normal. Tricuspid valve regurgitation is not demonstrated. No evidence of tricuspid stenosis. Aortic Valve: The aortic valve is tricuspid. Aortic valve regurgitation is trivial. No aortic stenosis is present. Pulmonic Valve: The pulmonic valve was grossly normal. Pulmonic valve regurgitation is trivial. No evidence of pulmonic stenosis. Aorta: The aortic root and ascending aorta are structurally normal, with no evidence of dilitation. Venous: The inferior vena cava was not well visualized. IAS/Shunts: The atrial septum is grossly normal. Additional Comments: A device lead is visualized in the right atrium and right ventricle.  LEFT VENTRICLE PLAX 2D LVIDd:         6.80 cm      Diastology LVIDs:         5.40 cm      LV e' medial:    4.49 cm/s LV PW:         0.90  cm      LV E/e' medial:  19.6 LV IVS:        0.90 cm      LV e' lateral:   4.33 cm/s LVOT diam:     2.10 cm      LV E/e' lateral: 20.3 LV SV:         46 LV SV Index:   21 LVOT Area:     3.46 cm  LV Volumes (MOD) LV vol d, MOD A2C: 169.0 ml LV vol d, MOD A4C: 259.0 ml LV vol s, MOD A2C: 110.0 ml LV vol s, MOD A4C: 143.0 ml LV SV MOD A2C:     59.0 ml LV SV MOD A4C:     259.0 ml LV SV MOD BP:      86.1 ml RIGHT VENTRICLE RV S prime:     14.10 cm/s TAPSE (M-mode): 2.2 cm LEFT ATRIUM             Index       RIGHT ATRIUM           Index LA diam:        4.20 cm 1.90 cm/m  RA Area:     13.00 cm LA Vol (A2C):   13.5 ml 6.10 ml/m  RA Volume:   27.10 ml  12.24  ml/m LA Vol (A4C):   25.5 ml 11.52 ml/m LA Biplane Vol: 20.0 ml 9.03 ml/m  AORTIC VALVE             PULMONIC VALVE LVOT Vmax:   84.70 cm/s  PV Vmax:          2.09 m/s LVOT Vmean:  57.600 cm/s PV Peak grad:     17.5 mmHg LVOT VTI:    0.133 m     PR End Diast Vel: 9.55 msec  AORTA Ao Root diam: 3.50 cm Ao Asc diam:  3.30 cm MITRAL VALVE MV Area (PHT): 1.51 cm     SHUNTS MV Area VTI:   0.97 cm     Systemic VTI:  0.13 m MV Peak grad:  9.4 mmHg     Systemic Diam: 2.10 cm MV Mean grad:  4.5 mmHg MV Vmax:       1.53 m/s MV Vmean:      99.8 cm/s MV Decel Time: 504 msec MV E velocity: 88.10 cm/s MV A velocity: 137.00 cm/s MV E/A ratio:  0.64 Eleonore Chiquito MD Electronically signed by Eleonore Chiquito MD Signature Date/Time: 02/15/2021/3:17:36 PM    Final    CT ANGIO HEAD CODE STROKE  Result Date: 02/14/2021 CLINICAL DATA:  Initial evaluation for acute dizziness, double vision. EXAM: CT ANGIOGRAPHY HEAD AND NECK TECHNIQUE: Multidetector CT imaging of the head and neck was performed using the standard protocol during bolus administration of intravenous contrast. Multiplanar CT image reconstructions and MIPs were obtained to evaluate the vascular anatomy. Carotid stenosis measurements (when applicable) are obtained utilizing NASCET criteria, using the distal internal carotid diameter as the denominator. CONTRAST:  28m OMNIPAQUE IOHEXOL 350 MG/ML SOLN COMPARISON:  Prior CTA from 03/31/2020. FINDINGS: CT HEAD FINDINGS Brain: Cerebral volume within normal limits. Mild chronic microvascular ischemic disease noted involving the supratentorial cerebral white matter. 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 No acute intracranial hemorrhage. No acute large vessel territory infarct. No mass lesion or mass effect. No hydrocephalus or extra-axial fluid collection. Vascular: No hyperdense vessel. Skull: Scalp soft tissues and calvarium within normal limits. Sinuses: Mild chronic right sphenoid  sinusitis noted. Paranasal sinuses are otherwise clear. No mastoid effusion. Orbits: Unremarkable. Review of the MIP images confirms the above findings CTA NECK FINDINGS Aortic arch: Visualized aortic arch normal in caliber with normal branch pattern. Mild atheromatous change about the arch itself. No hemodynamically significant stenosis about the origin of the great vessels. Right carotid system: Right common and internal carotid arteries widely patent without stenosis, dissection or occlusion. Minimal plaque about the right carotid bulb without stenosis. Left carotid system: Left common and internal carotid arteries widely patent without stenosis, dissection or occlusion. Vertebral arteries: Both vertebral arteries arise from the subclavian arteries. No proximal subclavian artery stenosis. Both vertebral arteries widely patent without stenosis, dissection or occlusion. Skeleton: Mild-to-moderate multilevel cervical spondylosis without high-grade spinal stenosis. No worrisome osseous lesions. Other neck: No other acute soft tissue abnormality within the neck. No mass or adenopathy. 3 mm left thyroid nodule noted, of doubtful significance given size and patient age, no follow-up imaging recommended (ref: J Am Coll Radiol. 2015 Feb;12(2): 143-50). Upper chest: Left-sided pacemaker/AICD partially visualized. Scattered atelectatic changes with mild peribronchial thickening noted within the visualized lungs. Review of the MIP images confirms the above findings CTA HEAD FINDINGS Anterior circulation: Both internal carotid arteries widely patent to the termini without stenosis. A1 segments widely patent. Normal anterior communicating artery complex. Both anterior cerebral arteries widely patent to their distal aspects without stenosis. No M1 stenosis or occlusion. Normal MCA bifurcations. Distal MCA branches well perfused and symmetric. Posterior circulation: Both vertebral arteries patent to the vertebrobasilar junction  without stenosis. Neither PICA origin well visualized. Basilar widely patent to its distal aspect without stenosis. Superior cerebral arteries patent bilaterally. Left PCA supplied via the basilar. Right PCA supplied via a hypoplastic right P1 segment and robust right posterior communicating artery. Both PCAs perfused to their distal aspects without stenosis. Venous sinuses: Grossly patent allowing for timing the contrast bolus. Anatomic variants: None significant.  No aneurysm. Review of the MIP images confirms the above findings IMPRESSION: CT HEAD IMPRESSION: 1. No acute intracranial abnormality. 2. Mild chronic microvascular ischemic disease with small remote lacunar infarct at the genu of the right internal capsule. CTA HEAD AND NECK IMPRESSION: Normal CTA of the head and neck. No large vessel occlusion, hemodynamically significant stenosis, or other acute vascular abnormality. Electronically Signed   By: Jeannine Boga M.D.   On: 02/14/2021 20:34   CT ANGIO NECK CODE STROKE  Result Date: 02/14/2021 CLINICAL DATA:  Initial evaluation for acute dizziness, double vision. EXAM: CT ANGIOGRAPHY HEAD AND NECK TECHNIQUE: Multidetector CT imaging of the head and neck was performed using the standard protocol during bolus administration of intravenous contrast. Multiplanar CT image reconstructions and MIPs were obtained to evaluate the vascular anatomy. Carotid stenosis measurements (when applicable) are obtained utilizing NASCET criteria, using the distal internal carotid diameter as the denominator. CONTRAST:  51m OMNIPAQUE IOHEXOL 350 MG/ML SOLN COMPARISON:  Prior CTA from 03/31/2020. FINDINGS: CT HEAD FINDINGS Brain: Cerebral volume within normal limits. Mild chronic microvascular ischemic disease noted involving the supratentorial cerebral white matter. 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 No acute intracranial hemorrhage. No acute large vessel territory infarct. No  mass lesion or mass effect. No hydrocephalus or extra-axial fluid collection. Vascular: No hyperdense vessel. Skull: Scalp soft tissues and calvarium within normal limits. Sinuses: Mild chronic right sphenoid sinusitis noted. Paranasal sinuses are otherwise clear. No mastoid effusion. Orbits: Unremarkable. Review of the MIP images confirms the above findings CTA NECK FINDINGS  Aortic arch: Visualized aortic arch normal in caliber with normal branch pattern. Mild atheromatous change about the arch itself. No hemodynamically significant stenosis about the origin of the great vessels. Right carotid system: Right common and internal carotid arteries widely patent without stenosis, dissection or occlusion. Minimal plaque about the right carotid bulb without stenosis. Left carotid system: Left common and internal carotid arteries widely patent without stenosis, dissection or occlusion. Vertebral arteries: Both vertebral arteries arise from the subclavian arteries. No proximal subclavian artery stenosis. Both vertebral arteries widely patent without stenosis, dissection or occlusion. Skeleton: Mild-to-moderate multilevel cervical spondylosis without high-grade spinal stenosis. No worrisome osseous lesions. Other neck: No other acute soft tissue abnormality within the neck. No mass or adenopathy. 3 mm left thyroid nodule noted, of doubtful significance given size and patient age, no follow-up imaging recommended (ref: J Am Coll Radiol. 2015 Feb;12(2): 143-50). Upper chest: Left-sided pacemaker/AICD partially visualized. Scattered atelectatic changes with mild peribronchial thickening noted within the visualized lungs. Review of the MIP images confirms the above findings CTA HEAD FINDINGS Anterior circulation: Both internal carotid arteries widely patent to the termini without stenosis. A1 segments widely patent. Normal anterior communicating artery complex. Both anterior cerebral arteries widely patent to their distal  aspects without stenosis. No M1 stenosis or occlusion. Normal MCA bifurcations. Distal MCA branches well perfused and symmetric. Posterior circulation: Both vertebral arteries patent to the vertebrobasilar junction without stenosis. Neither PICA origin well visualized. Basilar widely patent to its distal aspect without stenosis. Superior cerebral arteries patent bilaterally. Left PCA supplied via the basilar. Right PCA supplied via a hypoplastic right P1 segment and robust right posterior communicating artery. Both PCAs perfused to their distal aspects without stenosis. Venous sinuses: Grossly patent allowing for timing the contrast bolus. Anatomic variants: None significant.  No aneurysm. Review of the MIP images confirms the above findings IMPRESSION: CT HEAD IMPRESSION: 1. No acute intracranial abnormality. 2. Mild chronic microvascular ischemic disease with small remote lacunar infarct at the genu of the right internal capsule. CTA HEAD AND NECK IMPRESSION: Normal CTA of the head and neck. No large vessel occlusion, hemodynamically significant stenosis, or other acute vascular abnormality. Electronically Signed   By: Jeannine Boga M.D.   On: 02/14/2021 20:34     Assessment and Plan:   Admitted with possible CVA awaiting MRI on Monday with [employer] rep.   NICM- EF had improved to normal in 2019 but has now decreased. To 30-35%.  Home meds  altace 10, toprol XL 100 mg , amlodipine 5, ASA.currently not on altace and amlodipine.  May need to let altace washout and then begin entresto. Also with WMA on echo ? Need for cardiac cath.  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  v fib arrest had MDT ICD shock in 11/2019 for V fib.  Pt did have syncope then.  Not aware of any recent shocks.  PAF no recent episodes and anticoagulations was stopped for thigh hematoma.  Will interrogate device ( I do not see that it was done in ER with their remote device) plan is to eval tomorrow with MRI.  No a fib on tele,  would hold anticoagulation unless a fib occurs until interrogation. HLD on lipitor and tricor LDL is 34 Trig is elevated 300s HTN here labile. With possible CVA permissive hypertension.    Risk Assessment/Risk Scores:                For questions or updates, please contact [place] Please consult [email or web address]  for contact info under    Signed, Cecilie Kicks, NP  02/16/2021 10:00 AM

## 2021-02-16 NOTE — Progress Notes (Signed)
PROGRESS NOTE    Christopher [name]  [id number] DOB: 06-01-1951 DOA: 02/14/2021 PCP: Hoyt Koch, MD   Brief Narrative: 70 year old with past medical history significant for CAD status post DES to RCA 2014, paroxysmal A. fib not on anticoagulation due to a history of thigh  hematoma, V. 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  Evaluation in the ED CT head without contrast revealed no evidence of acute infarct.  CT angio head and neck revealed no evidence of large vessel occlusion.  COVID test negative.   Assessment & Plan:   Principal Problem:   Vertigo Active Problems:   Essential hypertension   AF (paroxysmal atrial fibrillation) (HCC)   Benign prostatic hyperplasia   Coronary artery disease involving native coronary artery of native heart without angina pectoris   OSA on CPAP   Diplopia   GERD without esophagitis   Uncontrolled type 2 diabetes mellitus with hyperglycemia, without long-term current use of insulin (HCC)   1-Vertigo and Diplopia: Concern for stroke, versus isolated 3rd neve palsy.  CT head negative for acute finding. CT head neck negative for large vessel occlusion. Plan is to get MRI, unable to get MRI until Monday due to ICD in place Appreciate neurology evaluation. Cardiology will help arrange interrogation of ICD on Monday  Continue with aspirin for now. PT, OT, speech evaluation Continue with a statin, fenofibrate.  LDL 34. Triglyceride 303  2-Hypertension: Permissive  hypertension in the setting of concern for stroke  Cardiomyopathy,  Newly Reduce EF, 35 % Cardiology consulted.   Paroxysmal A. fib: Continue with metoprolol. Not on anticoagulation due to her history of thigh hematoma while on Xarelto.  CAD Continue aspirin, metoprolol,  statin.  Chest pain-free.  Uncontrolled type 2 diabetes with hyperglycemia Hemoglobin A1c: 11 Continue Gargline. Increase dose to 30 units.  SS> change meal coverage to 8 units.  Will need insulin at discharge  BPH: Continue with Flomax  OSA continue with CPAP       Estimated body mass index is 32.9 kg/m as calculated from the following:   Height as of this encounter: '5\' 10"'$  (1.778 m).   Weight as of this encounter: 104 kg.   DVT prophylaxis: Lovenox Code Status: Full code Family Communication: Care discussed with patient Disposition Plan:  Status is: Observation  The patient remains OBS appropriate and will d/c before 2 midnights.  Dispo: The patient is from: Home              Anticipated d/c is to: Home              Patient currently is not medically stable to d/c.   Difficult to place patient No        Consultants:  Neurology  Procedures:  ECHO  Antimicrobials:    Subjective: Dizziness improving, diplopia persist.   Objective: Vitals:   02/15/21 2339 02/16/21 0504 02/16/21 0802 02/16/21 1208  BP: (!) 155/76 (!) 160/70 124/87 (!) 147/70  Pulse: 62 60 71 65  Resp: '17 17 18 19  '$ Temp: 98.3 F (36.8 C) 98.9 F (37.2 C) 98.1 F (36.7 C) 97.7 F (36.5 C)  TempSrc: Oral Axillary Oral Axillary  SpO2: 96% 92% 95% 96%  Weight:  104 kg    Height:        Intake/Output Summary (Last 24 hours) at 02/16/2021 1306 Last  data filed at 02/16/2021 0802 Gross per 24 hour  Intake 240 ml  Output 350 ml  Net -110 ml    Filed Weights   02/14/21 1835 02/15/21 0151 02/16/21 0504  Weight: 107 kg 104.2 kg 104 kg    Examination:  General exam: NAD Respiratory system: CTA Cardiovascular system: S 1, S 2 RRR Gastrointestinal system: BS present, soft, nt Central nervous system: alert Extremities: no edema   Data Reviewed: I have personally reviewed following labs and imaging studies  CBC: Recent Labs  Lab 02/14/21 1858 02/16/21 0033  WBC 5.7 6.3   HGB 14.6 14.4  HCT 42.1 41.3  MCV 84.5 85.7  PLT 168 147*    Basic Metabolic Panel: Recent Labs  Lab 02/14/21 1858 02/15/21 0811 02/16/21 0033  NA 133* 134* 135  K 3.4* 3.5 3.9  CL 96* 101 99  CO2 '27 24 27  '$ GLUCOSE 377* 346* 242*  BUN '11 11 16  '$ CREATININE 0.94 0.90 1.09  CALCIUM 9.0 8.9 9.5    GFR: Estimated Creatinine Clearance: 75.1 mL/min (by C-G formula based on SCr of 1.09 mg/dL). Liver Function Tests: No results for input(s): AST, ALT, ALKPHOS, BILITOT, PROT, ALBUMIN in the last 168 hours. No results for input(s): LIPASE, AMYLASE in the last 168 hours. No results for input(s): AMMONIA in the last 168 hours. Coagulation Profile: No results for input(s): INR, PROTIME in the last 168 hours. Cardiac Enzymes: No results for input(s): CKTOTAL, CKMB, CKMBINDEX, TROPONINI in the last 168 hours. BNP (last 3 results) No results for input(s): PROBNP in the last 8760 hours. HbA1C: Recent Labs    02/15/21 0458  HGBA1C 11.5*    CBG: Recent Labs  Lab 02/15/21 0740 02/15/21 1203 02/15/21 1656 02/15/21 2055 02/16/21 1206  GLUCAP 311* 328* 160* 175* 359*    Lipid Profile: Recent Labs    02/15/21 0458  CHOL 125  HDL 30*  LDLCALC 34  TRIG 303*  CHOLHDL 4.2    Thyroid Function Tests: No results for input(s): TSH, T4TOTAL, FREET4, T3FREE, THYROIDAB in the last 72 hours. Anemia Panel: No results for input(s): VITAMINB12, FOLATE, FERRITIN, TIBC, IRON, RETICCTPCT in the last 72 hours. Sepsis Labs: No results for input(s): PROCALCITON, LATICACIDVEN in the last 168 hours.  Recent Results (from the past 240 hour(s))  Resp Panel by RT-PCR (Flu A&B, Covid) Nasopharyngeal Swab     Status: None   Collection Time: 02/14/21 10:16 PM   Specimen: Nasopharyngeal Swab; Nasopharyngeal(NP) swabs in vial transport medium  Result Value Ref Range Status   SARS Coronavirus 2 by RT PCR NEGATIVE NEGATIVE Final    Comment: (NOTE) SARS-CoV-2 target nucleic acids are NOT  DETECTED.  The SARS-CoV-2 RNA is generally detectable in upper respiratory specimens during the acute phase of infection. The lowest concentration of SARS-CoV-2 viral copies this assay can detect is 138 copies/mL. A negative result does not preclude SARS-Cov-2 infection and should not be used as the sole basis for treatment or other patient management decisions. A negative result may occur with  improper specimen collection/handling, submission of specimen other than nasopharyngeal swab, presence of viral mutation(s) within the areas targeted by this assay, and inadequate number of viral copies(<138 copies/mL). A negative result must be combined with clinical observations, patient history, and epidemiological information. The expected result is Negative.  Fact Sheet for Patients:  EntrepreneurPulse.com.au  Fact Sheet for Healthcare Providers:  IncredibleEmployment.be  This test is no t yet approved or cleared by the [place] FDA and  has been  authorized for detection and/or diagnosis of SARS-CoV-2 by FDA under an Emergency Use Authorization (EUA). This EUA will remain  in effect (meaning this test can be used) for the duration of the COVID-19 declaration under Section 564(b)(1) of the Act, 21 U.S.C.section 360bbb-3(b)(1), unless the authorization is terminated  or revoked sooner.       Influenza A by PCR NEGATIVE NEGATIVE Final   Influenza B by PCR NEGATIVE NEGATIVE Final    Comment: (NOTE) The Xpert Xpress SARS-CoV-2/FLU/RSV plus assay is intended as an aid in the diagnosis of influenza from Nasopharyngeal swab specimens and should not be used as a sole basis for treatment. Nasal washings and aspirates are unacceptable for Xpert Xpress SARS-CoV-2/FLU/RSV testing.  Fact Sheet for Patients: EntrepreneurPulse.com.au  Fact Sheet for Healthcare Providers: IncredibleEmployment.be  This test is not yet  approved or cleared by the [place] FDA and has been authorized for detection and/or diagnosis of SARS-CoV-2 by FDA under an Emergency Use Authorization (EUA). This EUA will remain in effect (meaning this test can be used) for the duration of the COVID-19 declaration under Section 564(b)(1) of the Act, 21 U.S.C. section 360bbb-3(b)(1), unless the authorization is terminated or revoked.  Performed at [facility], [address] [place], [place] [address]           Radiology Studies: ECHOCARDIOGRAM COMPLETE  Result Date: 02/15/2021    ECHOCARDIOGRAM REPORT   Patient Name:   ELAZAR BETHKE Date of Exam: 02/15/2021 Medical Rec #:  [id number]      Height:       70.0 in Accession #:    [id number]     Weight:       229.7 lb Date of Birth:  [birth year]      BSA:          2.214 m Patient Age:    26 years       BP:           162/95 mmHg Patient Gender: M              HR:           69 bpm. Exam Location:  Inpatient Procedure: 2D Echo, Cardiac Doppler, Color Doppler and Intracardiac            Opacification Agent Indications:    Stroke I63.9  History:        Patient has prior history of Echocardiogram examinations, most                 recent 01/26/2020. CAD, Signs/Symptoms:Murmur; Risk Factors:Sleep                 Apnea, Hypertension, Dyslipidemia and Diabetes. Hx. of V. fib                 arrest. Chronic kidney disease.                  Mitral Valve: 30 mm prosthetic annuloplasty ring valve is                 present in the mitral position. Procedure Date: 09/27/2012.  Sonographer:    Darlina Sicilian RDCS Referring Phys: [id number] [place]  1. 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 The EF drop is new. The wall motion abnormalities are new. Findings could represent stress induced cardiomyopathy vs LAD infarction, clinical correlation is recommended. Left ventricular ejection fraction, by estimation, is 30 to 35%. The left ventricle has moderately  decreased function. The left ventricle demonstrates regional wall motion abnormalities (see scoring diagram/findings for description). The left ventricular internal cavity size was severely dilated. Left ventricular diastolic function could not be evaluated.  2. Right ventricular systolic function is normal. The right ventricular size is normal. Tricuspid regurgitation signal is inadequate for assessing PA pressure.  3. The mitral valve has been repaired/replaced. No evidence of mitral valve regurgitation. No evidence of mitral stenosis. There is a 30 mm prosthetic annuloplasty ring present in the mitral position. Procedure Date: 09/27/2012. Echo findings are consistent with normal structure and function of the mitral valve prosthesis.  4. The aortic valve is tricuspid. Aortic valve regurgitation is trivial. No aortic stenosis is present. Comparison(s): Changes from prior study are noted. EF is now reduced. New WMA described above. Conclusion(s)/Recommendation(s): No intracardiac source of embolism detected on this transthoracic study. A transesophageal echocardiogram is recommended to exclude cardiac source of embolism if clinically indicated. No left ventricular mural or apical thrombus/thrombi. 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 The EF drop is new. The wall motion abnormalities are new. Findings could represent stress induced cardiomyopathy vs LAD infarction, clinical correlation is recommended. Left ventricular ejection fraction, by estimation, is 30 to 35%. The left ventricle has moderately decreased function. The left ventricle demonstrates regional wall motion abnormalities. Definity contrast agent was given IV to delineate the left ventricular endocardial borders. The left ventricular internal cavity size was severely dilated. There is no left ventricular hypertrophy. Left ventricular diastolic function could not be evaluated due to mitral valve repair. Left  ventricular diastolic function could not be evaluated.  LV Wall Scoring: The entire apex is akinetic. Right Ventricle: The right ventricular size is normal. No increase in right ventricular wall thickness. Right ventricular systolic function is normal. Tricuspid regurgitation signal is inadequate for assessing PA pressure. Left Atrium: Left atrial size was normal in size. Right Atrium: Right atrial size was normal in size. Pericardium: Trivial pericardial effusion is present. Mitral Valve: The mitral valve has been repaired/replaced. No evidence of mitral valve regurgitation. There is a 30 mm prosthetic annuloplasty ring present in the mitral position. Procedure Date: 09/27/2012. Echo findings are consistent with normal structure and function of the mitral valve prosthesis. No evidence of mitral valve stenosis. MV peak gradient, 9.4 mmHg. The mean mitral valve gradient is 4.5 mmHg. Tricuspid Valve: The tricuspid valve is grossly normal. Tricuspid valve regurgitation is not demonstrated. No evidence of tricuspid stenosis. Aortic Valve: The aortic valve is tricuspid. Aortic valve regurgitation is trivial. No aortic stenosis is present. Pulmonic Valve: The pulmonic valve was grossly normal. Pulmonic valve regurgitation is trivial. No evidence of pulmonic stenosis. Aorta: The aortic root and ascending aorta are structurally normal, with no evidence of dilitation. Venous: The inferior vena cava was not well visualized. IAS/Shunts: The atrial septum is grossly normal. Additional Comments: A device lead is visualized in the right atrium and right ventricle.  LEFT VENTRICLE PLAX 2D LVIDd:         6.80 cm      Diastology LVIDs:         5.40 cm      LV e' medial:    4.49 cm/s LV PW:         0.90 cm      LV E/e' medial:  19.6 LV IVS:        0.90 cm      LV e' lateral:   4.33 cm/s LVOT diam:  2.10 cm      LV E/e' lateral: 20.3 LV SV:         46 LV SV Index:   21 LVOT Area:     3.46 cm  LV Volumes (MOD) LV vol d, MOD A2C:  169.0 ml LV vol d, MOD A4C: 259.0 ml LV vol s, MOD A2C: 110.0 ml LV vol s, MOD A4C: 143.0 ml LV SV MOD A2C:     59.0 ml LV SV MOD A4C:     259.0 ml LV SV MOD BP:      86.1 ml RIGHT VENTRICLE RV S prime:     14.10 cm/s TAPSE (M-mode): 2.2 cm LEFT ATRIUM             Index       RIGHT ATRIUM           Index LA diam:        4.20 cm 1.90 cm/m  RA Area:     13.00 cm LA Vol (A2C):   13.5 ml 6.10 ml/m  RA Volume:   27.10 ml  12.24 ml/m LA Vol (A4C):   25.5 ml 11.52 ml/m LA Biplane Vol: 20.0 ml 9.03 ml/m  AORTIC VALVE             PULMONIC VALVE LVOT Vmax:   84.70 cm/s  PV Vmax:          2.09 m/s LVOT Vmean:  57.600 cm/s PV Peak grad:     17.5 mmHg LVOT VTI:    0.133 m     PR End Diast Vel: 9.55 msec  AORTA Ao Root diam: 3.50 cm Ao Asc diam:  3.30 cm MITRAL VALVE MV Area (PHT): 1.51 cm     SHUNTS MV Area VTI:   0.97 cm     Systemic VTI:  0.13 m MV Peak grad:  9.4 mmHg     Systemic Diam: 2.10 cm MV Mean grad:  4.5 mmHg MV Vmax:       1.53 m/s MV Vmean:      99.8 cm/s MV Decel Time: 504 msec MV E velocity: 88.10 cm/s MV A velocity: 137.00 cm/s MV E/A ratio:  0.64 Eleonore Chiquito MD Electronically signed by Eleonore Chiquito MD Signature Date/Time: 02/15/2021/3:17:36 PM    Final    CT ANGIO HEAD CODE STROKE  Result Date: 02/14/2021 CLINICAL DATA:  Initial evaluation for acute dizziness, double vision. EXAM: CT ANGIOGRAPHY HEAD AND NECK TECHNIQUE: Multidetector CT imaging of the head and neck was performed using the standard protocol during bolus administration of intravenous contrast. Multiplanar CT image reconstructions and MIPs were obtained to evaluate the vascular anatomy. Carotid stenosis measurements (when applicable) are obtained utilizing NASCET criteria, using the distal internal carotid diameter as the denominator. CONTRAST:  8m OMNIPAQUE IOHEXOL 350 MG/ML SOLN COMPARISON:  Prior CTA from 03/31/2020. FINDINGS: CT HEAD FINDINGS Brain: Cerebral volume within normal limits. Mild chronic microvascular ischemic  disease noted involving the supratentorial cerebral white matter. 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 No acute intracranial hemorrhage. No acute large vessel territory infarct. No mass lesion or mass effect. No hydrocephalus or extra-axial fluid collection. Vascular: No hyperdense vessel. Skull: Scalp soft tissues and calvarium within normal limits. Sinuses: Mild chronic right sphenoid sinusitis noted. Paranasal sinuses are otherwise clear. No mastoid effusion. Orbits: Unremarkable. Review of the MIP images confirms the above findings CTA NECK FINDINGS Aortic arch: Visualized aortic arch normal in caliber with normal branch pattern. Mild atheromatous change  about the arch itself. No hemodynamically significant stenosis about the origin of the great vessels. Right carotid system: Right common and internal carotid arteries widely patent without stenosis, dissection or occlusion. Minimal plaque about the right carotid bulb without stenosis. Left carotid system: Left common and internal carotid arteries widely patent without stenosis, dissection or occlusion. Vertebral arteries: Both vertebral arteries arise from the subclavian arteries. No proximal subclavian artery stenosis. Both vertebral arteries widely patent without stenosis, dissection or occlusion. Skeleton: Mild-to-moderate multilevel cervical spondylosis without high-grade spinal stenosis. No worrisome osseous lesions. Other neck: No other acute soft tissue abnormality within the neck. No mass or adenopathy. 3 mm left thyroid nodule noted, of doubtful significance given size and patient age, no follow-up imaging recommended (ref: J Am Coll Radiol. 2015 Feb;12(2): 143-50). Upper chest: Left-sided pacemaker/AICD partially visualized. Scattered atelectatic changes with mild peribronchial thickening noted within the visualized lungs. Review of the MIP images confirms the above findings CTA HEAD FINDINGS Anterior circulation:  Both internal carotid arteries widely patent to the termini without stenosis. A1 segments widely patent. Normal anterior communicating artery complex. Both anterior cerebral arteries widely patent to their distal aspects without stenosis. No M1 stenosis or occlusion. Normal MCA bifurcations. Distal MCA branches well perfused and symmetric. Posterior circulation: Both vertebral arteries patent to the vertebrobasilar junction without stenosis. Neither PICA origin well visualized. Basilar widely patent to its distal aspect without stenosis. Superior cerebral arteries patent bilaterally. Left PCA supplied via the basilar. Right PCA supplied via a hypoplastic right P1 segment and robust right posterior communicating artery. Both PCAs perfused to their distal aspects without stenosis. Venous sinuses: Grossly patent allowing for timing the contrast bolus. Anatomic variants: None significant.  No aneurysm. Review of the MIP images confirms the above findings IMPRESSION: CT HEAD IMPRESSION: 1. No acute intracranial abnormality. 2. Mild chronic microvascular ischemic disease with small remote lacunar infarct at the genu of the right internal capsule. CTA HEAD AND NECK IMPRESSION: Normal CTA of the head and neck. No large vessel occlusion, hemodynamically significant stenosis, or other acute vascular abnormality. Electronically Signed   By: Jeannine Boga M.D.   On: 02/14/2021 20:34   CT ANGIO NECK CODE STROKE  Result Date: 02/14/2021 CLINICAL DATA:  Initial evaluation for acute dizziness, double vision. EXAM: CT ANGIOGRAPHY HEAD AND NECK TECHNIQUE: Multidetector CT imaging of the head and neck was performed using the standard protocol during bolus administration of intravenous contrast. Multiplanar CT image reconstructions and MIPs were obtained to evaluate the vascular anatomy. Carotid stenosis measurements (when applicable) are obtained utilizing NASCET criteria, using the distal internal carotid diameter as the  denominator. CONTRAST:  22m OMNIPAQUE IOHEXOL 350 MG/ML SOLN COMPARISON:  Prior CTA from 03/31/2020. FINDINGS: CT HEAD FINDINGS Brain: Cerebral volume within normal limits. Mild chronic microvascular ischemic disease noted involving the supratentorial cerebral white matter. 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 No acute intracranial hemorrhage. No acute large vessel territory infarct. No mass lesion or mass effect. No hydrocephalus or extra-axial fluid collection. Vascular: No hyperdense vessel. Skull: Scalp soft tissues and calvarium within normal limits. Sinuses: Mild chronic right sphenoid sinusitis noted. Paranasal sinuses are otherwise clear. No mastoid effusion. Orbits: Unremarkable. Review of the MIP images confirms the above findings CTA NECK FINDINGS Aortic arch: Visualized aortic arch normal in caliber with normal branch pattern. Mild atheromatous change about the arch itself. No hemodynamically significant stenosis about the origin of the great vessels. Right carotid system: Right common and internal carotid arteries  widely patent without stenosis, dissection or occlusion. Minimal plaque about the right carotid bulb without stenosis. Left carotid system: Left common and internal carotid arteries widely patent without stenosis, dissection or occlusion. Vertebral arteries: Both vertebral arteries arise from the subclavian arteries. No proximal subclavian artery stenosis. Both vertebral arteries widely patent without stenosis, dissection or occlusion. Skeleton: Mild-to-moderate multilevel cervical spondylosis without high-grade spinal stenosis. No worrisome osseous lesions. Other neck: No other acute soft tissue abnormality within the neck. No mass or adenopathy. 3 mm left thyroid nodule noted, of doubtful significance given size and patient age, no follow-up imaging recommended (ref: J Am Coll Radiol. 2015 Feb;12(2): 143-50). Upper chest: Left-sided pacemaker/AICD  partially visualized. Scattered atelectatic changes with mild peribronchial thickening noted within the visualized lungs. Review of the MIP images confirms the above findings CTA HEAD FINDINGS Anterior circulation: Both internal carotid arteries widely patent to the termini without stenosis. A1 segments widely patent. Normal anterior communicating artery complex. Both anterior cerebral arteries widely patent to their distal aspects without stenosis. No M1 stenosis or occlusion. Normal MCA bifurcations. Distal MCA branches well perfused and symmetric. Posterior circulation: Both vertebral arteries patent to the vertebrobasilar junction without stenosis. Neither PICA origin well visualized. Basilar widely patent to its distal aspect without stenosis. Superior cerebral arteries patent bilaterally. Left PCA supplied via the basilar. Right PCA supplied via a hypoplastic right P1 segment and robust right posterior communicating artery. Both PCAs perfused to their distal aspects without stenosis. Venous sinuses: Grossly patent allowing for timing the contrast bolus. Anatomic variants: None significant.  No aneurysm. Review of the MIP images confirms the above findings IMPRESSION: CT HEAD IMPRESSION: 1. No acute intracranial abnormality. 2. Mild chronic microvascular ischemic disease with small remote lacunar infarct at the genu of the right internal capsule. CTA HEAD AND NECK IMPRESSION: Normal CTA of the head and neck. No large vessel occlusion, hemodynamically significant stenosis, or other acute vascular abnormality. Electronically Signed   By: Jeannine Boga M.D.   On: 02/14/2021 20:34        Scheduled Meds:  aspirin EC  81 mg Oral Daily   atorvastatin  40 mg Oral Daily   enoxaparin (LOVENOX) injection  40 mg Subcutaneous Daily   fenofibrate  160 mg Oral Daily   fluticasone  1 spray Each Nare Daily   insulin aspart  0-20 Units Subcutaneous TID WC   insulin aspart  4 Units Subcutaneous TID WC    [START ON 02/17/2021] insulin glargine-yfgn  20 Units Subcutaneous Daily   loratadine  10 mg Oral Daily   metoprolol succinate  100 mg Oral Daily   tamsulosin  0.4 mg Oral Daily   Continuous Infusions:   LOS: 0 days    Time spent: 35 minutes.     Elmarie Shiley, MD [facility]   If 7PM-7AM, please contact night-coverage [email or web address]  02/16/2021, 1:06 PM

## 2021-02-17 ENCOUNTER — Observation Stay (HOSPITAL_COMMUNITY): Payer: Medicare Other

## 2021-02-17 ENCOUNTER — Ambulatory Visit (INDEPENDENT_AMBULATORY_CARE_PROVIDER_SITE_OTHER): Payer: Medicare Other

## 2021-02-17 DIAGNOSIS — I255 Ischemic cardiomyopathy: Secondary | ICD-10-CM | POA: Diagnosis not present

## 2021-02-17 DIAGNOSIS — I428 Other cardiomyopathies: Secondary | ICD-10-CM | POA: Diagnosis not present

## 2021-02-17 DIAGNOSIS — R42 Dizziness and giddiness: Secondary | ICD-10-CM | POA: Diagnosis not present

## 2021-02-17 DIAGNOSIS — I639 Cerebral infarction, unspecified: Secondary | ICD-10-CM | POA: Diagnosis not present

## 2021-02-17 LAB — GLUCOSE, CAPILLARY
Glucose-Capillary: 282 mg/dL — ABNORMAL HIGH (ref 70–99)
Glucose-Capillary: 211 mg/dL — ABNORMAL HIGH (ref 70–99)
Glucose-Capillary: 190 mg/dL — ABNORMAL HIGH (ref 70–99)
Glucose-Capillary: 274 mg/dL — ABNORMAL HIGH (ref 70–99)

## 2021-02-17 LAB — BASIC METABOLIC PANEL
Anion gap: 9 (ref 5–15)
BUN: 14 mg/dL (ref 8–23)
CO2: 26 mmol/L (ref 22–32)
Calcium: 9 mg/dL (ref 8.9–10.3)
Chloride: 101 mmol/L (ref 98–111)
Creatinine, Ser: 0.95 mg/dL (ref 0.61–1.24)
GFR, Estimated: >60 mL/min (ref >60)
Glucose, Bld: 234 mg/dL — ABNORMAL HIGH (ref 70–99)
Potassium: 3.2 mmol/L — ABNORMAL LOW (ref 3.5–5.1)
Sodium: 136 mmol/L (ref 135–145)

## 2021-02-17 MED ORDER — SACUBITRIL-VALSARTAN 24-26 MG PO TABS
1.0000 | ORAL_TABLET | Freq: Two times a day (BID) | ORAL | Status: DC
  Administered 2021-02-17 – 2021-02-18 (×3): 1 via ORAL
  Filled 2021-02-17 (×4): qty 1

## 2021-02-17 MED ORDER — INSULIN STARTER KIT- PEN NEEDLES (ENGLISH)
1.0000 | Freq: Once | Status: AC
  Administered 2021-02-17: 1
  Filled 2021-02-17: qty 1

## 2021-02-17 MED ORDER — INSULIN ASPART 100 UNIT/ML IJ SOLN
0.0000 [IU] | Freq: Three times a day (TID) | INTRAMUSCULAR | Status: DC
Start: 2021-02-18 — End: 2021-02-19
  Administered 2021-02-18: 11 [IU] via SUBCUTANEOUS
  Administered 2021-02-18: 7 [IU] via SUBCUTANEOUS

## 2021-02-17 MED ORDER — INSULIN ASPART 100 UNIT/ML IJ SOLN
0.0000 [IU] | Freq: Every day | INTRAMUSCULAR | Status: DC
Start: 2021-02-17 — End: 2021-02-19
  Administered 2021-02-17: 3 [IU] via SUBCUTANEOUS

## 2021-02-17 MED ORDER — CLOPIDOGREL BISULFATE 75 MG PO TABS
75.0000 mg | ORAL_TABLET | Freq: Every day | ORAL | Status: DC
  Administered 2021-02-17 – 2021-02-18 (×2): 75 mg via ORAL
  Filled 2021-02-17 (×2): qty 1

## 2021-02-17 MED ORDER — POTASSIUM CHLORIDE CRYS ER 20 MEQ PO TBCR
20.0000 meq | EXTENDED_RELEASE_TABLET | Freq: Once | ORAL | Status: AC
  Administered 2021-02-17: 20 meq via ORAL
  Filled 2021-02-17: qty 1

## 2021-02-17 NOTE — Progress Notes (Addendum)
STROKE TEAM PROGRESS NOTE   INTERVAL HISTORY Christopher [name] is laying in the bed, in NAD. Is using eye patch. No family at bedside. Christopher [name] does not voice any concerns, states his vision is slightly improved.  Christopher [name] denies any prior history of stroke or TIA.  MRI scan of the brain shows paramedian left pontine/midbrain lacunar infarct.  Christopher [name] does have a history of A. fib in the past but has refused anticoagulation due to thigh hematoma Vitals:   02/17/21 0440 02/17/21 0752 02/17/21 0944 02/17/21 1203  BP: (!) 159/85 135/87  (!) 156/92  Pulse: 64 71  69  Resp: '15 16  20  '$ Temp: 98.1 F (36.7 C) 98.4 F (36.9 C)  98.2 F (36.8 C)  TempSrc: Oral Oral Oral Oral  SpO2: 94% 95%    Weight: 104 kg     Height:       CBC:  Recent Labs  Lab 02/14/21 1858 02/16/21 0033  WBC 5.7 6.3  HGB 14.6 14.4  HCT 42.1 41.3  MCV 84.5 85.7  PLT 168 [id number]*   Basic Metabolic Panel:  Recent Labs  Lab 02/16/21 0033 02/17/21 1155  NA 135 136  K 3.9 3.2*  CL 99 101  CO2 27 26  GLUCOSE 242* 234*  BUN 16 14  CREATININE 1.09 0.95  CALCIUM 9.5 9.0   Lipid Panel:  Recent Labs  Lab 02/15/21 0458  CHOL 125  TRIG 303*  HDL 30*  CHOLHDL 4.2  VLDL 61*  LDLCALC 34   HgbA1c:  Recent Labs  Lab 02/15/21 0458  HGBA1C 11.5*     IMAGING past 24 hours MR BRAIN WO CONTRAST  Result Date: 02/17/2021 CLINICAL DATA:  Neuro deficit, acute, stroke suspected EXAM: MRI HEAD WITHOUT CONTRAST TECHNIQUE: Multiplanar, multiecho pulse sequences of the brain and surrounding structures were obtained without intravenous contrast. COMPARISON:  None. FINDINGS: Brain: 7 mm focus of reduced diffusion is present at the parasagittal left pontomesencephalic junction. There is no intracranial hemorrhage. There is no intracranial mass, mass effect, or edema. There is no hydrocephalus or extra-axial fluid collection. Prominence of the ventricles and sulci reflects generalized parenchymal volume loss. Patchy T2 hyperintensity in the supratentorial  white matter is nonspecific may reflect minor chronic microvascular ischemic changes. Chronic small vessel infarct near the genu of the right internal capsule. Chronic small vessel infarct of the right caudate body. Vascular: Major vessel flow voids at the skull base are preserved. Skull and upper cervical spine: Normal marrow signal is preserved. Sinuses/Orbits: Minor mucosal thickening.  Orbits are unremarkable. Other: Sella is unremarkable. Patchy left mastoid fluid opacification. IMPRESSION: Small acute infarct at the junction of the pons and midbrain on the left, which could potentially account for reported visual symptoms. Electronically Signed   By: Macy Mis M.D.   On: 02/17/2021 14:30    PHYSICAL EXAM  Physical Exam  Constitutional: Appears well-developed and well-nourished.  Elderly Caucasian male not in distress Psych: Affect appropriate to situation Eyes: No scleral injection HENT: No OP obstrucion MSK: no joint deformities.  Cardiovascular: Normal rate and regular rhythm.  Respiratory: Effort normal, non-labored breathing GI: Soft.  No distension. There is no tenderness.  Skin: WDI  Neuro: Mental Status: [name] is awake, alert, oriented to person, place, month, year, and situation. [name] is able to give a clear and coherent history. No signs of aphasia or neglect Cranial Nerves: II: Visual Fields are full. Pupils are equal, round, and reactive to light.   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  No nystagmus on right lateral gaze V: Facial sensation is symmetric to temperature VII: Facial movement is symmetric.  VIII: hearing is intact to voice X: Uvula elevates symmetrically XI: Shoulder shrug is symmetric. XII: tongue is midline without atrophy or fasciculations.  Motor: Tone is normal. Bulk is normal. 5/5 strength was present in all four extremities.   Sensory: Sensation is symmetric to light touch and temperature in the arms and legs. Cerebellar: FNF and HKS are intact bilaterally   ASSESSMENT/PLAN Christopher [name] is a 70 y.o. male with history of CAD status post DES to RCA 2014, paroxysmal A. fib not on anticoagulation due to a history of thigh  hematoma, V. 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 Christopher [name] reports that Christopher [name] was using his computer on Thursday morning when Christopher [name] felt dizzy and began to have trouble seeing with a double vision.  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 2D Echo  1. 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 The EF drop is new. The wall motion abnormalities are new. Findings could represent stress induced cardiomyopathy vs LAD infarction, clinical correlation is  recommended. Left ventricular ejection fraction, by estimation, is 30 to 35%. The left ventricle has moderately decreased function. The left ventricle demonstrates regional  wall motion abnormalities (see scoring diagram/findings for description). The left ventricular internal cavity size was severely dilated. Left ventricular diastolic function could not  be evaluated.   2. Right ventricular systolic function is normal. The right ventricular size is normal. Tricuspid regurgitation signal is inadequate for assessing PA pressure.   3. The mitral valve has been repaired/replaced. No evidence of mitral valve regurgitation. No evidence of mitral stenosis. There  is a 30 mm prosthetic annuloplasty ring present in the mitral position. Procedure Date: 09/27/2012. Echo findings are consistent with normal structure and function of the mitral valve prosthesis.   4. The aortic valve is tricuspid. Aortic valve regurgitation is trivial. No aortic stenosis is present.  - cardiology consulted  LDL 34 HgbA1c 11. VTE prophylaxis -SCD's, Lovenox    Diet   Diet heart healthy/carb modified Room service appropriate? Yes; Fluid consistency: Thin   aspirin 81 mg daily prior to admission, now on aspirin 81 mg daily and clopidogrel 75 mg daily. For 3 weeks then Plavix alone Therapy recommendations:  pending Disposition:  pending therapy recs  Hypertension Home meds:  Norvasc '5mg'$ , metoprolol XL '100mg'$  daily and ramipril '10mg'$  daily Stable Permissive hypertension (OK if < 220/120) but gradually normalize in 5-7 days Long-term BP goal normotensive  Atrial Fibrillation  Not on AC  Continue metoprolol  Hyperlipidemia Home meds:  lipitor '40mg'$  , resumed in hospital LDL 34, goal < 70 Continue statin at discharge  Diabetes type II UnControlled Home meds:  metformin HgbA1c 11.5, goal < 7.0 CBGs Recent Labs    02/16/21 2101 02/17/21 0755 02/17/21 1206  GLUCAP 180* 282* 211*    SSI  Other Stroke Risk Factors Advanced Age >/= 68  Obesity, Body mass index is 32.9 kg/m., BMI >/= 30 associated with increased stroke risk, recommend weight loss, diet and exercise as appropriate  Coronary artery disease Obstructive sleep apnea, on CPAP at home Congestive heart failure  Other Active Problems BPH on Flomax   Hospital day # 0  Beulah Gandy, NP   I have personally obtained history,examined Christopher [name], reviewed notes, independently viewed imaging studies, participated in medical decision making and plan of care.ROS completed by me personally and pertinent positives fully documented  I have made any additions or clarifications directly to the above note. Agree  with note above.  [name] presented with sudden onset of diplopia as well as gait ataxia and dizziness secondary to paramedian left pontine/midbrain lacunar infarct likely from small vessel disease.  Recommend dual antiplatelet therapy of aspirin and Plavix for 3 weeks followed by Plavix alone and aggressive risk factor modification.  Cardiology consult for low EF and cardiomyopathy.  Continue CPAP for his sleep apnea.  Continue taping his glasses to help with his diplopia and if it does not improve in 2 to 3 months may consider prisms.  Greater than 50% time during Christopher 35-minute visit was spent on counseling and coordination of care about his stroke and discussion about prevention and treatment and answering questions.  Antony Contras, MD Medical Director [place] Pager: [phone] 02/17/2021 6:02 PM   To contact Stroke Continuity provider, please refer to [email or web address] After hours, contact General Neurology

## 2021-02-17 NOTE — Progress Notes (Signed)
Changed device settings for MRI to   OVO  MRI mode/Tachy-therapies to off  Will reProgram device back to pre-MRI settings after completion of exam, and send transmission.

## 2021-02-17 NOTE — Progress Notes (Signed)
PROGRESS NOTE    ESCHER HARR  LNL:[id number] DOB: [birth year] DOA: 02/14/2021 PCP: Hoyt Koch, MD   Brief Narrative: 70 year old with past medical history significant for CAD status post DES to RCA 2014, paroxysmal A. fib not on anticoagulation due to a history of thigh  hematoma, V. 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  Evaluation in the ED CT head without contrast revealed no evidence of acute infarct.  CT angio head and neck revealed no evidence of large vessel occlusion.  COVID test negative.   Assessment & Plan:   Principal Problem:   Vertigo Active Problems:   Essential hypertension   AF (paroxysmal atrial fibrillation) (HCC)   Benign prostatic hyperplasia   Coronary artery disease involving native coronary artery of native heart without angina pectoris   OSA on CPAP   Diplopia   GERD without esophagitis   Uncontrolled type 2 diabetes mellitus with hyperglycemia, without long-term current use of insulin (HCC)   1-Acute small infarct pons and midbrain on the left:  Vertigo and Diplopia MRI brain: Small acute infarct at the junction of the pons and midbrain on the left, which could potentially account for reported visual symptoms. CT head negative for acute finding. CT head neck negative for large vessel occlusion. Appreciate neurology evaluation. Cardiology will help arrange interrogation of ICD on Monday  Continue with aspirin. Awaiting neurology recommendation for anticoagulation.  PT, OT, speech evaluation Continue with a statin, fenofibrate.  LDL 34. Triglyceride 303  2-Hypertension: Permissive  hypertension in the setting of concern for stroke Started on [place].   Cardiomyopathy,  Newly Reduce EF, 35 % Cardiology consulted.  Started on [place].   Plan for out patient follow up. No cath this admission due to acute stroke.   Paroxysmal A. fib: Continue with metoprolol. Not on anticoagulation due to her history of thigh hematoma while on Xarelto. Awaiting neurology, cardiology recommendations.   CAD Continue aspirin, metoprolol, statin.  Chest pain-free.  Uncontrolled type 2 diabetes with hyperglycemia Hemoglobin A1c: 11 Continue Gargline. Increase dose to 30 units.  SS> change meal coverage to 8 units.  Will need insulin at discharge. Needs education.  Diabetic educator.   BPH: Continue with Flomax  OSA continue with CPAP     Estimated body mass index is 32.9 kg/m as calculated from the following:   Height as of this encounter: $RemoveBeforeD'5\' 10"'iustWPpLAdxfVk$  (1.778 m).   Weight as of this encounter: 104 kg.   DVT prophylaxis: Lovenox Code Status: Full code Family Communication: Care discussed with patient, sister at bedside.  Disposition Plan:  Status is: Observation  The patient remains OBS appropriate and will d/c before 2 midnights.  Dispo: The patient is from: Home              Anticipated d/c is to: Home              Patient currently is not medically stable to d/c.   Difficult to place patient No        Consultants:  Neurology  Procedures:  ECHO  Antimicrobials:    Subjective: He report diplopia , notice improvement.  Dizziness is improving.  He relates he is not able to inject himself insulin.   Objective: Vitals:   02/17/21 0440 02/17/21 0752 02/17/21 0944 02/17/21 1203  BP: (!) 159/85 135/87  (!) 156/92  Pulse:  64 71  69  Resp: $Remo'15 16  20  'pozxZ$ Temp: 98.1 F (36.7 C) 98.4 F (36.9 C)  98.2 F (36.8 C)  TempSrc: Oral Oral Oral Oral  SpO2: 94% 95%    Weight: 104 kg     Height:        Intake/Output Summary (Last 24 hours) at 02/17/2021 1600 Last data filed at 02/17/2021 0944 Gross per 24 hour  Intake 480 ml  Output 350 ml  Net 130 ml    Filed Weights   02/15/21 0151 02/16/21 0504 02/17/21 0440   Weight: 104.2 kg 104 kg 104 kg    Examination:  General exam: NAD Respiratory system: CTA Cardiovascular system: S 1, S 2 RRR Gastrointestinal system: BS present, soft, nt Central nervous system: Alert Extremities: No edema   Data Reviewed: I have personally reviewed following labs and imaging studies  CBC: Recent Labs  Lab 02/14/21 1858 02/16/21 0033  WBC 5.7 6.3  HGB 14.6 14.4  HCT 42.1 41.3  MCV 84.5 85.7  PLT 168 147*    Basic Metabolic Panel: Recent Labs  Lab 02/14/21 1858 02/15/21 0811 02/16/21 0033 02/17/21 1155  NA 133* 134* 135 136  K 3.4* 3.5 3.9 3.2*  CL 96* 101 99 101  CO2 $Re'27 24 27 26  'Xbb$ GLUCOSE 377* 346* 242* 234*  BUN $Re'11 11 16 14  'RUl$ CREATININE 0.94 0.90 1.09 0.95  CALCIUM 9.0 8.9 9.5 9.0    GFR: Estimated Creatinine Clearance: 86.1 mL/min (by C-G formula based on SCr of 0.95 mg/dL). Liver Function Tests: No results for input(s): AST, ALT, ALKPHOS, BILITOT, PROT, ALBUMIN in the last 168 hours. No results for input(s): LIPASE, AMYLASE in the last 168 hours. No results for input(s): AMMONIA in the last 168 hours. Coagulation Profile: No results for input(s): INR, PROTIME in the last 168 hours. Cardiac Enzymes: No results for input(s): CKTOTAL, CKMB, CKMBINDEX, TROPONINI in the last 168 hours. BNP (last 3 results) No results for input(s): PROBNP in the last 8760 hours. HbA1C: Recent Labs    02/15/21 0458  HGBA1C 11.5*    CBG: Recent Labs  Lab 02/16/21 1206 02/16/21 1620 02/16/21 2101 02/17/21 0755 02/17/21 1206  GLUCAP 359* 214* 180* 282* 211*    Lipid Profile: Recent Labs    02/15/21 0458  CHOL 125  HDL 30*  LDLCALC 34  TRIG 303*  CHOLHDL 4.2    Thyroid Function Tests: No results for input(s): TSH, T4TOTAL, FREET4, T3FREE, THYROIDAB in the last 72 hours. Anemia Panel: No results for input(s): VITAMINB12, FOLATE, FERRITIN, TIBC, IRON, RETICCTPCT in the last 72 hours. Sepsis Labs: No results for input(s): PROCALCITON,  LATICACIDVEN in the last 168 hours.  Recent Results (from the past 240 hour(s))  Resp Panel by RT-PCR (Flu A&B, Covid) Nasopharyngeal Swab     Status: None   Collection Time: 02/14/21 10:16 PM   Specimen: Nasopharyngeal Swab; Nasopharyngeal(NP) swabs in vial transport medium  Result Value Ref Range Status   SARS Coronavirus 2 by RT PCR NEGATIVE NEGATIVE Final    Comment: (NOTE) SARS-CoV-2 target nucleic acids are NOT DETECTED.  The SARS-CoV-2 RNA is generally detectable in upper respiratory specimens during the acute phase of infection. The lowest concentration of SARS-CoV-2 viral copies this assay can detect is 138 copies/mL. A negative result does not preclude SARS-Cov-2 infection and should not be used as the sole basis for treatment or other patient management decisions. A negative result may occur with  improper specimen collection/handling, submission of specimen other than nasopharyngeal  swab, presence of viral mutation(s) within the areas targeted by this assay, and inadequate number of viral copies(<138 copies/mL). A negative result must be combined with clinical observations, patient history, and epidemiological information. The expected result is Negative.  Fact Sheet for Patients:  EntrepreneurPulse.com.au  Fact Sheet for Healthcare Providers:  IncredibleEmployment.be  This test is no t yet approved or cleared by the [place] FDA and  has been authorized for detection and/or diagnosis of SARS-CoV-2 by FDA under an Emergency Use Authorization (EUA). This EUA will remain  in effect (meaning this test can be used) for the duration of the COVID-19 declaration under Section 564(b)(1) of the Act, 21 U.S.C.section 360bbb-3(b)(1), unless the authorization is terminated  or revoked sooner.       Influenza A by PCR NEGATIVE NEGATIVE Final   Influenza B by PCR NEGATIVE NEGATIVE Final    Comment: (NOTE) The Xpert Xpress  SARS-CoV-2/FLU/RSV plus assay is intended as an aid in the diagnosis of influenza from Nasopharyngeal swab specimens and should not be used as a sole basis for treatment. Nasal washings and aspirates are unacceptable for Xpert Xpress SARS-CoV-2/FLU/RSV testing.  Fact Sheet for Patients: EntrepreneurPulse.com.au  Fact Sheet for Healthcare Providers: IncredibleEmployment.be  This test is not yet approved or cleared by the [place] FDA and has been authorized for detection and/or diagnosis of SARS-CoV-2 by FDA under an Emergency Use Authorization (EUA). This EUA will remain in effect (meaning this test can be used) for the duration of the COVID-19 declaration under Section 564(b)(1) of the Act, 21 U.S.C. section 360bbb-3(b)(1), unless the authorization is terminated or revoked.  Performed at [facility], [address] [place], [place] [address]           Radiology Studies: MR BRAIN WO CONTRAST  Result Date: 02/17/2021 CLINICAL DATA:  Neuro deficit, acute, stroke suspected EXAM: MRI HEAD WITHOUT CONTRAST TECHNIQUE: Multiplanar, multiecho pulse sequences of the brain and surrounding structures were obtained without intravenous contrast. COMPARISON:  None. FINDINGS: Brain: 7 mm focus of reduced diffusion is present at the parasagittal left pontomesencephalic junction. There is no intracranial hemorrhage. There is no intracranial mass, mass effect, or edema. There is no hydrocephalus or extra-axial fluid collection. Prominence of the ventricles and sulci reflects generalized parenchymal volume loss. Patchy T2 hyperintensity in the supratentorial white matter is nonspecific may reflect minor chronic microvascular ischemic changes. Chronic small vessel infarct near the genu of the right internal capsule. Chronic small vessel infarct of the right caudate body. Vascular: Major vessel flow voids at the skull base are preserved. Skull  and upper cervical spine: Normal marrow signal is preserved. Sinuses/Orbits: Minor mucosal thickening.  Orbits are unremarkable. Other: Sella is unremarkable. Patchy left mastoid fluid opacification. IMPRESSION: Small acute infarct at the junction of the pons and midbrain on the left, which could potentially account for reported visual symptoms. Electronically Signed   By: Macy Mis M.D.   On: 02/17/2021 14:30        Scheduled Meds:  aspirin EC  81 mg Oral Daily   atorvastatin  40 mg Oral Daily   clopidogrel  75 mg Oral Daily   enoxaparin (LOVENOX) injection  40 mg Subcutaneous Daily   fenofibrate  160 mg Oral Daily   fluticasone  1 spray Each Nare Daily   insulin aspart  0-20 Units Subcutaneous TID WC   insulin aspart  8 Units Subcutaneous TID WC   insulin glargine-yfgn  30 Units Subcutaneous Daily   insulin starter kit- pen needles  1 kit Other Once   loratadine  10 mg Oral Daily   metoprolol succinate  100 mg Oral Daily   sacubitril-valsartan  1 tablet Oral BID   tamsulosin  0.4 mg Oral Daily   Continuous Infusions:   LOS: 0 days    Time spent: 35 minutes.     Elmarie Shiley, MD [facility]   If 7PM-7AM, please contact night-coverage [email or web address]  02/17/2021, 4:00 PM

## 2021-02-17 NOTE — Plan of Care (Signed)
  RD consulted for nutrition education regarding diabetes.   Lab Results  Component Value Date   HGBA1C 11.5 (H) 02/15/2021   Spoke with pt and sister at bedside. Pt shares that he was diagnosed with DM in 2018 and has had period of poor control and good control. Pt shares that there have been periods where he has worked out at [employer] regularly and prepared healthy meals for himself, however, he "fell off the wagon" secondary to the [place] pandemic. He shares that he consumes a lot of snack foods, such as oatmeal cookies, and has been consuming a lot of sugary cereals and frozen dinners because "I just didn't feel like cooking".   Per pt, he is very hesitant to start insulin. RD discussed pathophysiology of DM and the role of insulin in helping improve glycemic control, especially with an elevated Hgb A1c. RD spent a long time discussing importance of DM self-management to help prevent further complications and enhance quality of life. Discussed consequences of continued poorly controlled DM. Pt shares that he feels better about taking insulin, especially if he is able to practice on himself. Pt shared with this RD a story about how he was apprehensive about using his CPAP machine initially, however, is able to see the benefit of this treatment after using it and finding a mask that better fits him; RD encouraged pt to view insulin and diabetes management in the same way. Used reflective listening and motivational interviewing to educate and empower pt.   RD provided "Carbohydrate Counting for People with Diabetes" handout from the Academy of Nutrition and Dietetics. Discussed different food groups and their effects on blood sugar, emphasizing carbohydrate-containing foods. Provided list of carbohydrates and recommended serving sizes of common foods.  Discussed importance of controlled and consistent carbohydrate intake throughout the day. Provided examples of ways to balance meals/snacks and encouraged  intake of high-fiber, whole grain complex carbohydrates. Teach back method used.  Expect fair compliance.  Body mass index is 32.9 kg/m. Pt meets criteria for obesity, class I based on current BMI.  Current diet order is heart healthy/ carb modified, patient is consuming approximately 100% of meals at this time. Labs and medications reviewed. No further nutrition interventions warranted at this time. RD contact information provided. If additional nutrition issues arise, please re-consult RD.  Loistine Chance, RD, LDN, [place] Registered Dietitian II Certified Diabetes Care and Education Specialist Please refer to [facility] for RD and/or RD on-call/weekend/after hours pager

## 2021-02-17 NOTE — Progress Notes (Signed)
PT Cancellation Note  Patient Details Name: Christopher [name] MRN: [id number] DOB: 04/04/1951   Cancelled Treatment:    Reason Eval/Treat Not Completed: Patient at procedure or test/unavailable. Will re-attempt later as time allows.    Oslo Huntsman 02/17/2021, 10:24 AM

## 2021-02-17 NOTE — Progress Notes (Signed)
PT Cancellation Note  Patient Details Name: BRITT BORST MRN: [id number] DOB: 02/25/1951   Cancelled Treatment:    Reason Eval/Treat Not Completed: Patient at procedure or test/unavailable. Patient now at MRI. Will follow up with PT when able.     Glada Wickstrom 02/17/2021, 3:14 PM

## 2021-02-17 NOTE — Progress Notes (Signed)
Progress Note  Patient Name: Christopher [name] Date of Encounter: 02/17/2021  [facility] Cardiologist: Ena Dawley, MD (Inactive) currently Dr. Debara Pickett who saw in consultation  Subjective   Overall doing well.  He was sleeping but easily arousable.  He thinks that vision is slightly improving.  Awaiting MRI.  Inpatient Medications    Scheduled Meds:  aspirin EC  81 mg Oral Daily   atorvastatin  40 mg Oral Daily   enoxaparin (LOVENOX) injection  40 mg Subcutaneous Daily   fenofibrate  160 mg Oral Daily   fluticasone  1 spray Each Nare Daily   insulin aspart  0-20 Units Subcutaneous TID WC   insulin aspart  8 Units Subcutaneous TID WC   insulin glargine-yfgn  30 Units Subcutaneous Daily   loratadine  10 mg Oral Daily   metoprolol succinate  100 mg Oral Daily   tamsulosin  0.4 mg Oral Daily   Continuous Infusions:  PRN Meds: acetaminophen **OR** acetaminophen (TYLENOL) oral liquid 160 mg/5 mL **OR** acetaminophen, hydrALAZINE, ondansetron (ZOFRAN) IV, polyethylene glycol   Vital Signs    Vitals:   02/16/21 1952 02/17/21 0029 02/17/21 0440 02/17/21 0752  BP: (!) 153/95 (!) 150/83 (!) 159/85 135/87  Pulse: 73 73 64 71  Resp: '18 18 15 16  '$ Temp: 98.1 F (36.7 C) 97.8 F (36.6 C) 98.1 F (36.7 C) 98.4 F (36.9 C)  TempSrc: Oral Oral Oral Oral  SpO2: 96% 96% 94% 95%  Weight:   104 kg   Height:        Intake/Output Summary (Last 24 hours) at 02/17/2021 0858 Last data filed at 02/16/2021 2000 Gross per 24 hour  Intake 240 ml  Output 350 ml  Net -110 ml   Last 3 Weights 02/17/2021 02/16/2021 02/15/2021  Weight (lbs) 229 lb 4.5 oz 229 lb 4.5 oz 229 lb 11.5 oz  Weight (kg) 104 kg 104 kg 104.2 kg      Telemetry    Sinus rhythm, occasional paced beat- Personally Reviewed  ECG    02/16/2021-sinus rhythm right bundle branch block- Personally Reviewed  Physical Exam   GEN: No acute distress.   Neck: No JVD Cardiac: RRR, no murmurs, rubs, or gallops.  Occasional  ectopy Respiratory: Clear to auscultation bilaterally. GI: Soft, nontender, non-distended  MS: No edema; No deformity. 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 Psych: Normal affect   Labs    High Sensitivity Troponin:   Recent Labs  Lab 02/16/21 0736 02/16/21 0949  TROPONINIHS 19* 17      Chemistry Recent Labs  Lab 02/14/21 1858 02/15/21 0811 02/16/21 0033  NA 133* 134* 135  K 3.4* 3.5 3.9  CL 96* 101 99  CO2 '27 24 27  '$ GLUCOSE 377* 346* 242*  BUN '11 11 16  '$ CREATININE 0.94 0.90 1.09  CALCIUM 9.0 8.9 9.5  GFRNONAA >60 >60 >60  ANIONGAP '10 9 9     '$ Hematology Recent Labs  Lab 02/14/21 1858 02/16/21 0033  WBC 5.7 6.3  RBC 4.98 4.82  HGB 14.6 14.4  HCT 42.1 41.3  MCV 84.5 85.7  MCH 29.3 29.9  MCHC 34.7 34.9  RDW 13.0 12.9  PLT 168 147*    BNPNo results for input(s): BNP, PROBNP in the last 168 hours.   DDimer No results for input(s): DDIMER in the last 168 hours.   Radiology    ECHOCARDIOGRAM COMPLETE  Result Date: 02/15/2021    ECHOCARDIOGRAM REPORT   Patient Name:   Christopher [name] Date of Exam:  02/15/2021 Medical Rec #:  [id number]      Height:       70.0 in Accession #:    [id number]     Weight:       229.7 lb Date of Birth:  Apr 21, 1951      BSA:          2.214 m Patient Age:    13 years       BP:           162/95 mmHg Patient Gender: M              HR:           69 bpm. Exam Location:  Inpatient Procedure: 2D Echo, Cardiac Doppler, Color Doppler and Intracardiac            Opacification Agent Indications:    Stroke I63.9  History:        Patient has prior history of Echocardiogram examinations, most                 recent 01/26/2020. CAD, Signs/Symptoms:Murmur; Risk Factors:Sleep                 Apnea, Hypertension, Dyslipidemia and Diabetes. Hx. of V. fib                 arrest. Chronic kidney disease.                  Mitral Valve: 30 mm prosthetic annuloplasty ring valve is                 present in the mitral position. Procedure Date: 09/27/2012.  Sonographer:     Darlina Sicilian RDCS Referring Phys: [id number] [place]  1. 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 The EF drop is new. The wall motion abnormalities are new. Findings could represent stress induced cardiomyopathy vs LAD infarction, clinical correlation is recommended. Left ventricular ejection fraction, by estimation, is 30 to 35%. The left ventricle has moderately decreased function. The left ventricle demonstrates regional wall motion abnormalities (see scoring diagram/findings for description). The left ventricular internal cavity size was severely dilated. Left ventricular diastolic function could not be evaluated.  2. Right ventricular systolic function is normal. The right ventricular size is normal. Tricuspid regurgitation signal is inadequate for assessing PA pressure.  3. The mitral valve has been repaired/replaced. No evidence of mitral valve regurgitation. No evidence of mitral stenosis. There is a 30 mm prosthetic annuloplasty ring present in the mitral position. Procedure Date: 09/27/2012. Echo findings are consistent with normal structure and function of the mitral valve prosthesis.  4. The aortic valve is tricuspid. Aortic valve regurgitation is trivial. No aortic stenosis is present. Comparison(s): Changes from prior study are noted. EF is now reduced. New WMA described above. Conclusion(s)/Recommendation(s): No intracardiac source of embolism detected on this transthoracic study. A transesophageal echocardiogram is recommended to exclude cardiac source of embolism if clinically indicated. No left ventricular mural or apical thrombus/thrombi. 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 The EF drop is new. The wall motion abnormalities are new. Findings could represent stress induced cardiomyopathy vs LAD infarction, clinical correlation is recommended. Left ventricular ejection fraction, by estimation, is 30 to 35%. The left  ventricle has moderately decreased function. The left ventricle demonstrates regional wall motion abnormalities. Definity contrast agent was given IV to delineate the left ventricular endocardial borders. The left ventricular internal cavity size was severely  dilated. There is no left ventricular hypertrophy. Left ventricular diastolic function could not be evaluated due to mitral valve repair. Left ventricular diastolic function could not be evaluated.  LV Wall Scoring: The entire apex is akinetic. Right Ventricle: The right ventricular size is normal. No increase in right ventricular wall thickness. Right ventricular systolic function is normal. Tricuspid regurgitation signal is inadequate for assessing PA pressure. Left Atrium: Left atrial size was normal in size. Right Atrium: Right atrial size was normal in size. Pericardium: Trivial pericardial effusion is present. Mitral Valve: The mitral valve has been repaired/replaced. No evidence of mitral valve regurgitation. There is a 30 mm prosthetic annuloplasty ring present in the mitral position. Procedure Date: 09/27/2012. Echo findings are consistent with normal structure and function of the mitral valve prosthesis. No evidence of mitral valve stenosis. MV peak gradient, 9.4 mmHg. The mean mitral valve gradient is 4.5 mmHg. Tricuspid Valve: The tricuspid valve is grossly normal. Tricuspid valve regurgitation is not demonstrated. No evidence of tricuspid stenosis. Aortic Valve: The aortic valve is tricuspid. Aortic valve regurgitation is trivial. No aortic stenosis is present. Pulmonic Valve: The pulmonic valve was grossly normal. Pulmonic valve regurgitation is trivial. No evidence of pulmonic stenosis. Aorta: The aortic root and ascending aorta are structurally normal, with no evidence of dilitation. Venous: The inferior vena cava was not well visualized. IAS/Shunts: The atrial septum is grossly normal. Additional Comments: A device lead is visualized in the  right atrium and right ventricle.  LEFT VENTRICLE PLAX 2D LVIDd:         6.80 cm      Diastology LVIDs:         5.40 cm      LV e' medial:    4.49 cm/s LV PW:         0.90 cm      LV E/e' medial:  19.6 LV IVS:        0.90 cm      LV e' lateral:   4.33 cm/s LVOT diam:     2.10 cm      LV E/e' lateral: 20.3 LV SV:         46 LV SV Index:   21 LVOT Area:     3.46 cm  LV Volumes (MOD) LV vol d, MOD A2C: 169.0 ml LV vol d, MOD A4C: 259.0 ml LV vol s, MOD A2C: 110.0 ml LV vol s, MOD A4C: 143.0 ml LV SV MOD A2C:     59.0 ml LV SV MOD A4C:     259.0 ml LV SV MOD BP:      86.1 ml RIGHT VENTRICLE RV S prime:     14.10 cm/s TAPSE (M-mode): 2.2 cm LEFT ATRIUM             Index       RIGHT ATRIUM           Index LA diam:        4.20 cm 1.90 cm/m  RA Area:     13.00 cm LA Vol (A2C):   13.5 ml 6.10 ml/m  RA Volume:   27.10 ml  12.24 ml/m LA Vol (A4C):   25.5 ml 11.52 ml/m LA Biplane Vol: 20.0 ml 9.03 ml/m  AORTIC VALVE             PULMONIC VALVE LVOT Vmax:   84.70 cm/s  PV Vmax:          2.09 m/s LVOT Vmean:  57.600 cm/s PV Peak  grad:     17.5 mmHg LVOT VTI:    0.133 m     PR End Diast Vel: 9.55 msec  AORTA Ao Root diam: 3.50 cm Ao Asc diam:  3.30 cm MITRAL VALVE MV Area (PHT): 1.51 cm     SHUNTS MV Area VTI:   0.97 cm     Systemic VTI:  0.13 m MV Peak grad:  9.4 mmHg     Systemic Diam: 2.10 cm MV Mean grad:  4.5 mmHg MV Vmax:       1.53 m/s MV Vmean:      99.8 cm/s MV Decel Time: 504 msec MV E velocity: 88.10 cm/s MV A velocity: 137.00 cm/s MV E/A ratio:  0.64 Eleonore Chiquito MD Electronically signed by Eleonore Chiquito MD Signature Date/Time: 02/15/2021/3:17:36 PM    Final     Cardiac Studies     1. 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 The EF drop is  new. The wall motion abnormalities are new. Findings could represent  stress induced cardiomyopathy vs LAD infarction, clinical correlation is  recommended. Left ventricular  ejection fraction, by estimation, is 30 to 35%. The left ventricle has   moderately decreased function. The left ventricle demonstrates regional  wall motion abnormalities (see scoring diagram/findings for description).  The left ventricular internal cavity  size was severely dilated. Left ventricular diastolic function could not  be evaluated.   2. Right ventricular systolic function is normal. The right ventricular  size is normal. Tricuspid regurgitation signal is inadequate for assessing  PA pressure.   3. The mitral valve has been repaired/replaced. No evidence of mitral  valve regurgitation. No evidence of mitral stenosis. There is a 30 mm  prosthetic annuloplasty ring present in the mitral position. Procedure  Date: 09/27/2012. Echo findings are  consistent with normal structure and function of the mitral valve  prosthesis.   4. The aortic valve is tricuspid. Aortic valve regurgitation is trivial.  No aortic stenosis is present.   Comparison(s): Changes from prior study are noted. EF is now reduced. New  WMA described above.   Patient Profile     70 y.o. 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  Assessment & Plan    Ischemic cardiomyopathy - Ejection fraction currently 35%.  EF previously had improved to 55% on coronary catheterization in 2018. - Agree with plan to utilize [facility] after full 36-hour washout of ACE inhibitor. Starting today. BP good.  - Continue with metoprolol succinate 100  Vertigo diplopia concerning for stroke - Awaiting MRI - High intensity statin, fibrate.  Consider transition over to Vascepa from fenofibrate. -No evidence of thrombus on echocardiogram.      For questions or updates, please contact [place] Please consult [email or web address] for contact info under        Signed, Candee Furbish, MD  02/17/2021, 8:58 AM

## 2021-02-17 NOTE — Progress Notes (Signed)
Inpatient Diabetes Program Recommendations  AACE/ADA: New Consensus Statement on Inpatient Glycemic Control (2015)  Target Ranges:  Prepandial:   less than 140 mg/dL      Peak postprandial:   less than 180 mg/dL (1-2 hours)      Critically ill patients:  140 - 180 mg/dL   Lab Results  Component Value Date   GLUCAP 211 (H) 02/17/2021   HGBA1C 11.5 (H) 02/15/2021    Review of Glycemic Control Results for [name], Christopher S "Christopher [name]" (MRN [id number]) as of 02/17/2021 13:02  Ref. Range 02/16/2021 16:20 02/16/2021 21:01 02/17/2021 07:55 02/17/2021 12:06  Glucose-Capillary Latest Ref Range: 70 - 99 mg/dL 214 (H) 180 (H) 282 (H) 211 (H)   Diabetes history: type 2 DM Outpatient Diabetes medications: Metformin 1000 mg BID Current orders for Inpatient glycemic control: Semglee 30 units QD, Novolog 8 units TID, Novolog 0-20 units TID  Inpatient Diabetes Program Recommendations:    Spoke with patient and patient's sister at length regarding outpatient diabetes management. Per patient's nonverbal communication appears frustrated with self injections and angry with need for changes. Lives alone. Reviewed patient's current A1c of 11.5% up from 6.7% in March 2022. Explained what a A1c is and what it measures. Also reviewed goal A1c with patient, importance of good glucose control @ home, and blood sugar goals. Discussed patho of DM, need for insulin, role of pancreas, impact of consuming large amounts of carbohydrates, survival skills, interventions, vascular changes and commorbidities. Patient will need a meter at discharge. Blood glucose meter kit (includes strips and lancets) (#[id number]). Discussed frequency of recommended times to check. Patient is currently not checking CBgs in outpatient setting. Reviewed differences between hyper vs hypo glycemia and interventions and when to call MD. Admits to consuming large amounts of cakes, cookies and TV dinners recently because of ease. Reviewed nutritional labels,  importance of increasing protein, discussed alternatives and encouraged mindfulness.  Educated patient and spouse on insulin pen use at home. Reviewed contents of insulin flexpen starter kit. Reviewed all steps if insulin pen including attachment of needle, 2-unit air shot, dialing up dose, giving injection, removing needle, disposal of sharps, storage of unused insulin, disposal of insulin etc. Patient unable to provide successful return demonstration. Will need further reinforcement. Discussed with RN and will review and allow patient to perform, if willing. Also reviewed troubleshooting with insulin pen. MD to give patient Rxs for insulin pens and insulin pen needles.  Thanks, Lauren McDaniel, MSN, RNC-OB Diabetes Coordinator [phone] (8a-5p)      

## 2021-02-18 ENCOUNTER — Ambulatory Visit (INDEPENDENT_AMBULATORY_CARE_PROVIDER_SITE_OTHER): Payer: Medicare Other

## 2021-02-18 DIAGNOSIS — I5022 Chronic systolic (congestive) heart failure: Secondary | ICD-10-CM | POA: Diagnosis not present

## 2021-02-18 DIAGNOSIS — R531 Weakness: Secondary | ICD-10-CM | POA: Diagnosis not present

## 2021-02-18 DIAGNOSIS — Z95810 Presence of automatic (implantable) cardiac defibrillator: Secondary | ICD-10-CM

## 2021-02-18 DIAGNOSIS — R42 Dizziness and giddiness: Secondary | ICD-10-CM | POA: Diagnosis not present

## 2021-02-18 DIAGNOSIS — H532 Diplopia: Secondary | ICD-10-CM | POA: Diagnosis not present

## 2021-02-18 DIAGNOSIS — I255 Ischemic cardiomyopathy: Secondary | ICD-10-CM | POA: Diagnosis not present

## 2021-02-18 LAB — GLUCOSE, CAPILLARY
Glucose-Capillary: 305 mg/dL — ABNORMAL HIGH (ref 70–99)
Glucose-Capillary: 268 mg/dL — ABNORMAL HIGH (ref 70–99)
Glucose-Capillary: 243 mg/dL — ABNORMAL HIGH (ref 70–99)

## 2021-02-18 LAB — BASIC METABOLIC PANEL
Anion gap: 10 (ref 5–15)
BUN: 13 mg/dL (ref 8–23)
CO2: 22 mmol/L (ref 22–32)
Calcium: 8.8 mg/dL — ABNORMAL LOW (ref 8.9–10.3)
Chloride: 105 mmol/L (ref 98–111)
Creatinine, Ser: 1.04 mg/dL (ref 0.61–1.24)
GFR, Estimated: >60 mL/min (ref >60)
Glucose, Bld: 172 mg/dL — ABNORMAL HIGH (ref 70–99)
Potassium: 3.4 mmol/L — ABNORMAL LOW (ref 3.5–5.1)
Sodium: 137 mmol/L (ref 135–145)

## 2021-02-18 MED ORDER — SACUBITRIL-VALSARTAN 24-26 MG PO TABS: 1.0000 | ORAL_TABLET | Freq: Two times a day (BID) | ORAL | 3 refills | Status: DC

## 2021-02-18 MED ORDER — ASPIRIN EC 81 MG PO TBEC: 81.0000 mg | DELAYED_RELEASE_TABLET | Freq: Every day | ORAL | 0 refills | Status: AC

## 2021-02-18 MED ORDER — CLOPIDOGREL BISULFATE 75 MG PO TABS: 75.0000 mg | ORAL_TABLET | Freq: Every day | ORAL | 3 refills | Status: DC

## 2021-02-18 MED ORDER — INSULIN ASPART PROT & ASPART (70-30 MIX) 100 UNIT/ML PEN: 22.0000 [IU] | PEN_INJECTOR | Freq: Two times a day (BID) | SUBCUTANEOUS | 11 refills | Status: DC

## 2021-02-18 MED ORDER — INSULIN ASPART 100 UNIT/ML FLEXPEN: 10.0000 [IU] | PEN_INJECTOR | Freq: Three times a day (TID) | SUBCUTANEOUS | 11 refills | Status: DC

## 2021-02-18 MED ORDER — SPIRONOLACTONE 25 MG PO TABS: 12.5000 mg | ORAL_TABLET | Freq: Every day | ORAL | 3 refills | Status: DC

## 2021-02-18 MED ORDER — INSULIN GLARGINE 100 UNIT/ML SOLOSTAR PEN: 30.0000 [IU] | PEN_INJECTOR | Freq: Every day | SUBCUTANEOUS | 11 refills | Status: DC

## 2021-02-18 MED ORDER — BLOOD GLUCOSE MONITOR KIT: PACK | 0 refills | Status: DC

## 2021-02-18 MED ORDER — SPIRONOLACTONE 12.5 MG HALF TABLET
12.5000 mg | ORAL_TABLET | Freq: Every day | ORAL | Status: DC
  Administered 2021-02-18: 12.5 mg via ORAL
  Filled 2021-02-18 (×2): qty 1

## 2021-02-18 MED ORDER — POTASSIUM CHLORIDE CRYS ER 20 MEQ PO TBCR
20.0000 meq | EXTENDED_RELEASE_TABLET | Freq: Once | ORAL | Status: AC
  Administered 2021-02-18: 20 meq via ORAL
  Filled 2021-02-18: qty 1

## 2021-02-18 MED ORDER — ASPIRIN EC 81 MG PO TBEC: 81.0000 mg | DELAYED_RELEASE_TABLET | Freq: Every day | ORAL | 0 refills | Status: DC

## 2021-02-18 NOTE — Progress Notes (Signed)
Progress Note  Patient Name: Christopher [name] Date of Encounter: 02/18/2021  [facility] Cardiologist: Pixie Casino, MD   Subjective   Feeling better.  Still has diplopia but improving.  In speaking with his sister, he has had many intolerances with different medications.  He has had problems with cost of insulin in the past.  This morning he took Plavix and he thinks he may be having a problem with this because he is having some lower abdominal discomfort and subtle dizziness.    Inpatient Medications    Scheduled Meds:  aspirin EC  81 mg Oral Daily   atorvastatin  40 mg Oral Daily   clopidogrel  75 mg Oral Daily   enoxaparin (LOVENOX) injection  40 mg Subcutaneous Daily   fenofibrate  160 mg Oral Daily   fluticasone  1 spray Each Nare Daily   insulin aspart  0-20 Units Subcutaneous TID WC   insulin aspart  0-5 Units Subcutaneous QHS   insulin aspart  8 Units Subcutaneous TID WC   insulin glargine-yfgn  30 Units Subcutaneous Daily   loratadine  10 mg Oral Daily   metoprolol succinate  100 mg Oral Daily   potassium chloride  20 mEq Oral Once   sacubitril-valsartan  1 tablet Oral BID   tamsulosin  0.4 mg Oral Daily   Continuous Infusions:  PRN Meds: acetaminophen **OR** acetaminophen (TYLENOL) oral liquid 160 mg/5 mL **OR** acetaminophen, hydrALAZINE, ondansetron (ZOFRAN) IV, polyethylene glycol   Vital Signs    Vitals:   02/17/21 2002 02/17/21 2310 02/18/21 0417 02/18/21 0733  BP: (!) 150/89 123/75 (!) 162/75 (!) 158/94  Pulse:  65  67  Resp: '15 20 18 20  '$ Temp: 98.2 F (36.8 C) 98 F (36.7 C) 98.3 F (36.8 C) 98 F (36.7 C)  TempSrc: Axillary Axillary Axillary Oral  SpO2: 96% 95% 96%   Weight:      Height:        Intake/Output Summary (Last 24 hours) at 02/18/2021 1109 Last data filed at 02/18/2021 1026 Gross per 24 hour  Intake 240 ml  Output 350 ml  Net -110 ml   Last 3 Weights 02/17/2021 02/16/2021 02/15/2021  Weight (lbs) 229 lb 4.5 oz 229 lb 4.5  oz 229 lb 11.5 oz  Weight (kg) 104 kg 104 kg 104.2 kg      Telemetry    Sinus rhythm with occasional paced beat- Personally Reviewed  ECG    Prior shows sinus rhythm right bundle branch block- Personally Reviewed  Physical Exam   GEN: No acute distress.   Neck: No JVD Cardiac: RRR, no murmurs, rubs, or gallops.  Respiratory: Clear to auscultation bilaterally. GI: Soft, nontender, non-distended  MS: No edema; No deformity. Neuro: 3rd nerve palsy Psych: Normal affect   Labs    High Sensitivity Troponin:   Recent Labs  Lab 02/16/21 0736 02/16/21 0949  TROPONINIHS 19* 17      Chemistry Recent Labs  Lab 02/16/21 0033 02/17/21 1155 02/18/21 0100  NA 135 136 137  K 3.9 3.2* 3.4*  CL 99 101 105  CO2 '27 26 22  '$ GLUCOSE 242* 234* 172*  BUN '16 14 13  '$ CREATININE 1.09 0.95 1.04  CALCIUM 9.5 9.0 8.8*  GFRNONAA >60 >60 >60  ANIONGAP '9 9 10     '$ Hematology Recent Labs  Lab 02/14/21 1858 02/16/21 0033  WBC 5.7 6.3  RBC 4.98 4.82  HGB 14.6 14.4  HCT 42.1 41.3  MCV 84.5 85.7  MCH  29.3 29.9  MCHC 34.7 34.9  RDW 13.0 12.9  PLT 168 147*    BNPNo results for input(s): BNP, PROBNP in the last 168 hours.   DDimer No results for input(s): DDIMER in the last 168 hours.   Radiology    MR BRAIN WO CONTRAST  Result Date: 02/17/2021 CLINICAL DATA:  Neuro deficit, acute, stroke suspected EXAM: MRI HEAD WITHOUT CONTRAST TECHNIQUE: Multiplanar, multiecho pulse sequences of the brain and surrounding structures were obtained without intravenous contrast. COMPARISON:  None. FINDINGS: Brain: 7 mm focus of reduced diffusion is present at the parasagittal left pontomesencephalic junction. There is no intracranial hemorrhage. There is no intracranial mass, mass effect, or edema. There is no hydrocephalus or extra-axial fluid collection. Prominence of the ventricles and sulci reflects generalized parenchymal volume loss. Patchy T2 hyperintensity in the supratentorial white matter is  nonspecific may reflect minor chronic microvascular ischemic changes. Chronic small vessel infarct near the genu of the right internal capsule. Chronic small vessel infarct of the right caudate body. Vascular: Major vessel flow voids at the skull base are preserved. Skull and upper cervical spine: Normal marrow signal is preserved. Sinuses/Orbits: Minor mucosal thickening.  Orbits are unremarkable. Other: Sella is unremarkable. Patchy left mastoid fluid opacification. IMPRESSION: Small acute infarct at the junction of the pons and midbrain on the left, which could potentially account for reported visual symptoms. Electronically Signed   By: Macy Mis M.D.   On: 02/17/2021 14:30    Cardiac Studies   EF 35  Patient Profile     70 y.o. 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  Assessment & Plan    Ischemic cardiomyopathy - EF 35% previously 86 on cath 2018.  New start Entresto, BP stable, continue with metoprolol succinate 100 mg a day. -I will add spironolactone 12.5 mg once a day. -Continue current medications, consider SGLT2 inhibitor such as Jardiance or [place] as outpatient. -Would increase Entresto as blood pressure tolerates as outpatient.  I would hold off on reinitiating Norvasc as I would rather him be on higher doses of Entresto.  Acute pontine stroke - Patient does not wish to proceed with anticoagulation.  He is currently on aspirin and Plavix.  After 3 weeks we will transition to Plavix monotherapy.  Appreciate neurology recommendations.  ICD OptiVol interrogation shows normal volume status.  Diabetes - Per primary team.  Medication  intolerances - I am concerned that he may not likely take [facility] as outpatient or Plavix.  He said that he took every anticoagulation, 3 different types and 1 because the headache, 1 caused thigh bruising etc.  He is well aware that if atrial fibrillation were to be present that being on anticoagulation is important and could help protect him against a second stroke which could prove to be fatal.  I am going ahead and signing off.  We will establish follow-up for him with Dr. Debara Pickett or APP.   For questions or updates, please contact [place] Please consult [email or web address] for contact info under        Signed, Candee Furbish, MD  02/18/2021, 11:09 AM

## 2021-02-18 NOTE — Progress Notes (Signed)
Occupational Therapy Treatment Note  Educated pt/sister on use of partial occlusion to improve functional vision. Handout provided. Strongly recommend follow up with HHOT.   02/18/21 1311  OT Visit Information  Last OT Received On 02/18/21  Assistance Needed +1  History of Present Illness 70 y.o. male with a past medical history significant for diabetes, hypertension, hyperlipidemia, atrial fibrillation, sleep apnea, severe mitral regurgitation s/p valvoplasty, coronary artery disease with ischemic cardiomyopathy s/p defibrillator. Admitted with diplopia and dizziness. Awaiting MRI - Possible posterior fossa stroke versus isolated peripheral partial 3rd nerve palsy.  Precautions  Precautions Fall  Precaution Comments impulsive  Pain Assessment  Pain Assessment No/denies pain  Cognition  Arousal/Alertness Awake/alert  Behavior During Therapy Impulsive  Overall Cognitive Status Impaired/Different from baseline  Area of Impairment Attention;Memory;Safety/judgement;Awareness  Current Attention Level Selective  Memory Decreased recall of precautions;Decreased short-term memory  Safety/Judgement Decreased awareness of safety;Decreased awareness of deficits  Awareness Emergent  General Comments poor insight into deficits andhow they affect him functionally  ADL  General ADL Comments Educated on strategies to reduce risk of falls; Educated on need for supervision with medication management  Bed Mobility  Overal bed mobility Modified Independent  Balance  Sitting balance-Leahy Scale Normal  Standing balance-Leahy Scale Fair  Vision- Assessment  Additional Comments Pt wearing glasses with nasal field of L lens occluded. Tape is diminishing double image and improving functional vision. Educated pt on technique adn how to progress use of partial occlusion. Sister present for education. Pt/sister verbalized understanding. Handout provided  Transfers  Stand pivot transfers Supervision  General  Comments  General comments (skin integrity, edema, etc.) PT previously worked with pt and recommended pt take his time and "slow down" to reduce risk of falls; pt impulsively looking for tape in his bag and lost balance toward R. Decreased awareness of balance deficits  OT - End of Session  Activity Tolerance Patient tolerated treatment well  Patient left in chair;with call bell/phone within reach;with family/visitor present  Nurse Communication  (DC needs)  OT Assessment/Plan  OT Plan Discharge plan remains appropriate  OT Visit Diagnosis Unsteadiness on feet (R26.81);Dizziness and giddiness (R42);Low vision, both eyes (H54.2)  OT Frequency (ACUTE ONLY) Min 2X/week  Follow Up Recommendations Home health OT;Other (comment) (follow up with eye doctor)  OT Equipment None recommended by OT  AM-PAC OT "6 Clicks" Daily Activity Outcome Measure (Version 2)  Help from another person eating meals? 4  Help from another person taking care of personal grooming? 4  Help from another person toileting, which includes using toliet, bedpan, or urinal? 4  Help from another person bathing (including washing, rinsing, drying)? 4  Help from another person to put on and taking off regular upper body clothing? 4  Help from another person to put on and taking off regular lower body clothing? 4  6 Click Score 24  Progressive Mobility  What is the highest level of mobility based on the progressive mobility assessment? Level 5 (Walks with assist in room/hall) - Balance while stepping forward/back and can walk in room with assist - Complete  OT Goal Progression  Progress towards OT goals Progressing toward goals  Acute Rehab OT Goals  Patient Stated Goal Go home  OT Goal Formulation With patient  Time For Goal Achievement 03/01/21  Potential to Achieve Goals Good  ADL Goals  Pt Will Perform Lower Body Dressing Independently;sit to/from stand  Pt Will Transfer to Toilet Independently;ambulating  Additional ADL  Goal #1 Patient will demonstrate understanding/purpose of  eye taping, and teach back how to remove small strips evey three days, and what to do if double vision persists, or improves.  OT Time Calculation  OT Start Time (ACUTE ONLY) 1305  OT Stop Time (ACUTE ONLY) 1329  OT Time Calculation (min) 24 min  OT General Charges  $OT Visit 1 Visit  OT Treatments  $Self Care/Home Management  8-22 mins  $Therapeutic Activity 8-22 mins  Maurie Boettcher, OT/L   Acute OT Clinical Specialist [place] Pager [phone] Office [phone]

## 2021-02-18 NOTE — Progress Notes (Signed)
Physical Therapy Treatment Patient Details Name: Christopher [name] MRN: [id number] DOB: 1951/06/27 Today's Date: 02/18/2021    History of Present Illness 70 y.o. male with a past medical history significant for diabetes, hypertension, hyperlipidemia, atrial fibrillation, sleep apnea, severe mitral regurgitation s/p valvoplasty, coronary artery disease with ischemic cardiomyopathy s/p defibrillator. Admitted with diplopia and dizziness. Awaiting MRI - Possible posterior fossa stroke versus isolated peripheral partial 3rd nerve palsy.    PT Comments    Patient received in bed, pleasant and continuing to have diploplia. Encouraged use of fake readers, which improved vision but he tells me it is still not 100%. Impulsive and tends to move faster than is safe given vision impairment- needed VC during session to slow down, which improved safety greatly. Moving a bit better than at eval at least.  Refusing to have anyone stay with him at home- in this case would definitely recommend use of RW for safety. Left sitting at EOB with OT present and attending. Will continue to follow.     Follow Up Recommendations  Outpatient PT (neuro rehab)     Equipment Recommendations  Rolling walker with 5" wheels    Recommendations for Other Services       Precautions / Restrictions Precautions Precautions: Fall Precaution Comments: Tape applied to L nasal side of vision/fake readers. Restrictions Weight Bearing Restrictions: No    Mobility  Bed Mobility Overal bed mobility: Independent                  Transfers Overall transfer level: Needs assistance Equipment used: None Transfers: Sit to/from Stand Sit to Stand: Supervision         General transfer comment: cues to slow down and move slower  Ambulation/Gait Ambulation/Gait assistance: Min guard Gait Distance (Feet): 200 Feet Assistive device: None Gait Pattern/deviations: Step-through pattern;Drifts right/left;Trunk flexed Gait  velocity: wnl but faster than is safe   General Gait Details: more stable than at eval, but did need VC for safety and to slow down especially- poor judge of what he is able to perform safety in face of new normal. Continue to recommend RW.   Stairs             Wheelchair Mobility    Modified Rankin (Stroke Patients Only)       Balance Overall balance assessment: Needs assistance Sitting-balance support: No upper extremity supported;Feet supported Sitting balance-Leahy Scale: Normal     Standing balance support: No upper extremity supported Standing balance-Leahy Scale: Fair                              Cognition Arousal/Alertness: Awake/alert Behavior During Therapy: WFL for tasks assessed/performed Overall Cognitive Status: Impaired/Different from baseline Area of Impairment: Safety/judgement;Awareness;Problem solving                         Safety/Judgement: Decreased awareness of safety;Decreased awareness of deficits Awareness: Emergent Problem Solving: Requires verbal cues General Comments: impulsive with poor insight into deficits- tends to walk faster than is safe given vision deficits. Needed VC for safety throughout session      Exercises      General Comments        Pertinent Vitals/Pain Pain Assessment: No/denies pain    Home Living     Available Help at Discharge: Family;Friend(s);Available PRN/intermittently Type of Home: House (condo)              Prior  Function            PT Goals (current goals can now be found in the care plan section) Acute Rehab PT Goals Patient Stated Goal: Go home PT Goal Formulation: With patient Time For Goal Achievement: 03/01/21 Potential to Achieve Goals: Good Progress towards PT goals: Progressing toward goals    Frequency    Min 4X/week      PT Plan Current plan remains appropriate    Co-evaluation              AM-PAC PT "6 Clicks" Mobility   Outcome  Measure  Help needed turning from your back to your side while in a flat bed without using bedrails?: None Help needed moving from lying on your back to sitting on the side of a flat bed without using bedrails?: None Help needed moving to and from a bed to a chair (including a wheelchair)?: None Help needed standing up from a chair using your arms (e.g., wheelchair or bedside chair)?: None Help needed to walk in hospital room?: A Little Help needed climbing 3-5 steps with a railing? : A Little 6 Click Score: 22    End of Session   Activity Tolerance: Patient tolerated treatment well Patient left: in bed;with call bell/phone within reach;Other (comment) (OT present and attending) Nurse Communication: Mobility status PT Visit Diagnosis: Unsteadiness on feet (R26.81);Other abnormalities of gait and mobility (R26.89);Ataxic gait (R26.0);Other symptoms and signs involving the nervous system (R29.898)     Time: 1255-1305 PT Time Calculation (min) (ACUTE ONLY): 10 min  Charges:  $Gait Training: 8-22 mins                    Windell Norfolk, DPT, PN2   Supplemental Physical Therapist [place]    Pager [phone] Acute Rehab Office [phone]

## 2021-02-18 NOTE — Evaluation (Signed)
Speech Language Pathology Evaluation Patient Details Name: SHAIKH ZOBELL MRN: [id number] DOB: 1950/10/29 Today's Date: 02/18/2021 Time: 1030-1050 SLP Time Calculation (min) (ACUTE ONLY): 20 min  Problem List:  Patient Active Problem List   Diagnosis Date Noted   Diplopia 02/15/2021   GERD without esophagitis 02/15/2021   Uncontrolled type 2 diabetes mellitus with hyperglycemia, without long-term current use of insulin ([place]) 02/15/2021   Vertigo 02/14/2021   Ventricular tachycardia ([place]) 01/03/2020   Nonischemic cardiomyopathy ([place]) 09/27/2017   Heart valve disease 09/27/2017   History of cardiac arrest 09/27/2017   Alcohol use 05/05/2017   Coronary artery disease involving native coronary artery of native heart without angina pectoris 05/05/2017   OSA on CPAP 05/05/2017   History of repair of mitral valve 05/05/2017   Benign prostatic hyperplasia 05/16/2015   Routine general medical examination at a health care facility [id number]   Chronic systolic CHF (congestive heart failure) ([place]) 10/25/2012   AF (paroxysmal atrial fibrillation) ([place]) 10/25/2012   Hyperlipidemia associated with type 2 diabetes mellitus ([place]) 09/04/2012   Severe mitral regurgitation    Coronary artery disease 08/12/2012   Essential hypertension 09/27/2008   SLEEP APNEA 09/27/2008   Past Medical History:  Past Medical History:  Diagnosis Date   Allergy    Arthritis    Atrial fibrillation ([place])    post op, intol of anticoag   Cardiac arrest ([place])    a. s/p MDT single chamber ICD; 11-10-18- pt denies having a heart attack   Chronic kidney disease    kidney stone   Coronary artery disease    a. 2/7 Cath: LM nl, LAD min irregs, LCX min irregs, RI 40, RCA 3m EF 55-60% basal to mid inf HK, 3-4+ MR;  b. 08/25/2012 PCI of RCA with 4.0x15 Vision BMS   Diabetes mellitus without complication (HCC)    GERD (gastroesophageal reflux disease)    hx   Heart murmur    Hyperlipidemia    on statin   Hypertension     S/P mitral valve repair 09/27/2012   Complex valvuloplasty including triangular resection of flail posterior leaflet with 30 mm Sorin Memo 3D ring annuloplasty via right mini thoracotomy approach   Severe mitral regurgitation    a. Mitral valve prolapse with flail segment of posterior leaflet and severe MR by TEE, remote h/o bacterial endocarditis    Sleep apnea    NPSG 01/21/06- AHI 40.7/hr cpap   Subacute bacterial endocarditis 03/22/2008   Strep viridans   Ventricular fibrillation ([place] 11/07/2019   appropriate shock (36J) for VF delivered   Past Surgical History:  Past Surgical History:  Procedure Laterality Date   AMPUTATION  07/28/2012   Procedure: AMPUTATION DIGIT;  Surgeon: PColin Rhein MD;  Location: [facility];  Service: Orthopedics;  Laterality: Right;  2nd toe   CARDIAC CATHETERIZATION     COLONOSCOPY     EP IMPLANTABLE DEVICE N/A 05/01/2016   Procedure: Loop Recorder Removal;  Surgeon: JThompson Grayer MD;  [facility];  Service: Cardiovascular;  Laterality: N/A;   FOOT SURGERY     BILATERAL TOES   ICD IMPLANT N/A 05/07/2017    Medtronic Visia AF MRI VR SureScan implanted by Dr CCurt Bearsfollowing VF arrest   Implantable loop recorder placement  03/31/13   MDT Linq implanted by Dr ARayann Hemanto evaluate for further afib   INTRAOPERATIVE TRANSESOPHAGEAL ECHOCARDIOGRAM N/A 09/27/2012   Procedure: INTRAOPERATIVE TRANSESOPHAGEAL ECHOCARDIOGRAM;  Surgeon: CRexene Alberts MD;  [place] [facility]  Service: Open  Heart Surgery;  Laterality: N/A;   LEFT AND RIGHT HEART CATHETERIZATION WITH CORONARY ANGIOGRAM N/A 08/12/2012   Procedure: LEFT AND RIGHT HEART CATHETERIZATION WITH CORONARY ANGIOGRAM;  Surgeon: [name], MD;  Location[facility] C[facility] LAB;  Service: Cardiovascular;  Laterality: N/A;   LEFT HEART CATH AND CORONARY ANGIOGRAPHY N/A 05/06/2017   Procedure: LEFT HEART CATH AND CORONARY ANGIOGRAPHY;  Surgeon: [name], MD;  Location: [place] [facility];  Service:  Cardiovascular;  Laterality: N/A;   LOOP RECORDER IMPLANT N/A 03/31/2013   Procedure: LOOP RECORDER IMPLANT;  Surgeon: [name], MD;  Location: [place] CATH LAB;  Service: Cardiovascular;  Laterality: N/A;   MITRAL VALVE REPAIR Right 09/27/2012   Procedure: MINIMALLY INVASIVE MITRAL VALVE REPAIR (MVR);  Surgeon: [name], MD;  Location: [place];  Service: Open Heart Surgery;  Laterality: Right;  Ultrasound guided   PERCUTANEOUS CORONARY STENT INTERVENTION (PCI-S) N/A 08/25/2012   Procedure: PERCUTANEOUS CORONARY STENT INTERVENTION (PCI-S);  Surgeon: Sherren Mocha, MD;  [facility] LAB;  Service: Cardiovascular;  Laterality: N/A;   SEPTOPLASTY     Dr. [name]   TEE WITHOUT CARDIOVERSION N/A 08/12/2012   Procedure: TRANSESOPHAGEAL ECHOCARDIOGRAM (TEE);  Surgeon: [name], MD;  Location: [facility] ENDOSCOPY;  Service: Cardiovascular;  Laterality: N/A;   TONSILLECTOMY     UVULOPALATOPHARYNGOPLASTY     HPI:  70 year old with past medical history significant for CAD status post DES to RCA 2014, paroxysmal A. fib not on anticoagulation due to a history of thigh  hematoma, V. 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 MRI of brain revealed paramedian left pontine/midbrain lacunar infarct.   Assessment / Plan / Recommendation Clinical Impression  Pt presents with mild cognitive deficits post acute CVA. Pt self reports changes in thinking skills since admission. He states waking up this morning and thinking he was at a [employer] base. He also states novel recall changes. [place] Mental Status (SLUMS) administered (18/30) with deficits exhibited in executive function skills, recall, and mental manipulation. Pt oriented x4 during interaction with SLP. Receptive and expressive language skills  as well as motor speech skills were intact. Pt continues to have some diplopia, working with OT and was able to complete clock drawing task with fake readers in place. PLOF pt was fully independent with all ADLs. Continued ST services indicated to maximize cognitive recovery.    SLP Assessment  SLP Recommendation/Assessment: Patient needs continued Speech Lanaguage Pathology Services SLP Visit Diagnosis: Cognitive communication deficit (R41.841)    Follow Up Recommendations  None    Frequency and Duration min 1 x/week  1 week      SLP Evaluation Cognition  Overall Cognitive Status: Impaired/Different from baseline Arousal/Alertness: Awake/alert Orientation Level: Oriented X4 Attention: Alternating;Sustained Sustained Attention: Impaired Sustained Attention Impairment: Verbal complex;Functional complex Alternating Attention: Impaired Alternating Attention Impairment: Verbal complex;Functional complex Memory: Impaired Memory Impairment: Decreased recall of new information;Decreased short term memory Awareness: Appears intact Executive Function: Organizing;Self Monitoring Organizing: Impaired Organizing Impairment: Verbal complex;Functional complex Self Monitoring: Impaired Self Monitoring Impairment: Verbal complex;Functional complex Safety/Judgment: Appears intact       Comprehension  Auditory Comprehension Overall Auditory Comprehension: Appears within functional limits for tasks assessed    Expression Expression Primary Mode of Expression: Verbal Verbal Expression Overall Verbal Expression: Appears within functional limits for tasks assessed Written Expression Dominant Hand:  Right   Oral / Motor  Oral Motor/Sensory Function Overall Oral Motor/Sensory Function: Within functional limits Motor Speech Overall Motor Speech: Appears within functional limits for tasks assessed   GO                    Hayden Rasmussen MA, CCC-SLP Acute Rehabilitation Services   02/18/2021,  11:14 AM

## 2021-02-18 NOTE — Discharge Summary (Addendum)
Physician Discharge Summary  CATHERINE OAK CQY:[id number] DOB: February 23, 1951 DOA: 02/14/2021  PCP: Myrlene Broker, MD  Admit date: 02/14/2021 Discharge date: 02/18/2021  Admitted From: Home  Disposition: Home   Recommendations for Outpatient Follow-up:  Follow up with PCP in 1-2 weeks Please obtain BMP/CBC in one week   Home Health: HH OT, and PT. He will benefit from Nurse as well, to assist with medications.   Discharge Condition: Stable.  CODE STATUS: Full Code Diet recommendation: Heart Healthy / Carb Modified   Brief/Interim Summary: 70 year old with past medical history significant for CAD status post DES to RCA 2014, paroxysmal A. fib not on anticoagulation due to a history of thigh  hematoma, V. 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   Evaluation in the ED CT head without contrast revealed no evidence of acute infarct.  CT angio head and neck revealed no evidence of large vessel occlusion.  COVID test negative.    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 CT head negative for acute finding. CT head neck negative for large vessel occlusion. Appreciate neurology evaluation. PT, OT, speech evaluation, will arrange HH.  Continue with a statin, fenofibrate.  LDL 34. Triglyceride 303 Plan for aspirin and plavix for 3 week, then plavix alone.   2-Hypertension: Permissive  hypertension in the setting of concern for stroke Started on [place].    Cardiomyopathy,  Newly Reduce EF, 35 % Cardiology consulted.  Started on [place]. Plan to start spironolactone.  Plan for out patient follow up. No  cath this admission due to acute stroke.    Paroxysmal A. fib: Continue with metoprolol. Not on anticoagulation due to her history of thigh hematoma while on Xarelto. Current stroke was small vessels diseases, Dr Pearlean Brownie recommend aspirin and plavix for 3 week, then plavix alone there after.    CAD Continue aspirin, metoprolol, statin.  Chest pain-free.   Uncontrolled type 2 diabetes with hyperglycemia Hemoglobin A1c: 11 Plan to resume Metformin at discharge.  Discussed with Diabetes Educator, plan to discharge him on 70/30 insulin 22 units BID Follow up with PCP for further adjustment of medications.  Consider  Jardiance or Marcelline Deist as ou patient.    BPH: Continue with Flomax   OSA continue with CPAP   Obesity; needs life style modifications.       Estimated body mass index is 32.9 kg/m as calculated from the following:   Height as of this encounter: 5\' 10"  (1.778 m).   Weight as of this encounter: 104 kg.     Discharge Diagnoses:  Principal Problem:   Vertigo Active Problems:   Essential hypertension   AF (paroxysmal atrial fibrillation) (HCC)   Benign prostatic hyperplasia   Coronary artery disease involving native coronary artery of native heart without angina pectoris   OSA on CPAP   Diplopia   GERD without esophagitis   Uncontrolled type 2 diabetes mellitus with hyperglycemia, without long-term current use of insulin [facility])    Discharge Instructions  Discharge Instructions     Diet - low sodium heart healthy   Complete by: As directed    Increase activity slowly   Complete by: As directed  Allergies as of 02/18/2021       Reactions   Codeine Other (See Comments)   Causes bad constipation   Dust Mite Extract Cough   Pollen Extract Cough        Medication List     STOP taking these medications    amLODipine 5 MG tablet Commonly known as: NORVASC   ramipril 10 MG capsule Commonly known as: ALTACE       TAKE these medications     acetaminophen 500 MG tablet Commonly known as: TYLENOL Take 500 mg by mouth every 6 (six) hours as needed for mild pain or headache.   aspirin EC 81 MG tablet Take 1 tablet (81 mg total) by mouth daily for 19 days. Stop aspirin after 9/4 What changed: additional instructions Notes to patient: Take for 3 weeks then stop **LAST DAY 9/4**    atorvastatin 40 MG tablet Commonly known as: LIPITOR TAKE 1 TABLET BY MOUTH  DAILY   blood glucose meter kit and supplies Kit Dispense based on patient and insurance preference. Use up to four times daily as directed.   cetirizine 10 MG tablet Commonly known as: ZYRTEC Take 10 mg by mouth daily.   cholecalciferol 25 MCG (1000 UNIT) tablet Commonly known as: VITAMIN D Take 1 tablet by mouth daily.   clopidogrel 75 MG tablet Commonly known as: PLAVIX Take 1 tablet (75 mg total) by mouth daily. Start taking on: February 19, 2021   fenofibrate 145 MG tablet Commonly known as: TRICOR TAKE 1 TABLET BY MOUTH  DAILY   fluticasone 50 MCG/ACT nasal spray Commonly known as: FLONASE USE 2 SPRAYS IN BOTH  NOSTRILS DAILY What changed: See the new instructions.   insulin aspart protamine - aspart (70-30) 100 UNIT/ML FlexPen Commonly known as: NOVOLOG 70/30 MIX Inject 22 Units into the skin 2 (two) times daily.   metFORMIN 1000 MG tablet Commonly known as: GLUCOPHAGE TAKE 1 TABLET BY MOUTH  TWICE DAILY WITH A MEAL   metoprolol succinate 100 MG 24 hr tablet Commonly known as: TOPROL-XL TAKE 1 TABLET BY MOUTH  DAILY OR IMMEDIATELY  FOLLOWING A MEAL What changed: See the new instructions.   sacubitril-valsartan 24-26 MG Commonly known as: ENTRESTO Take 1 tablet by mouth 2 (two) times daily.   spironolactone 25 MG tablet Commonly known as: ALDACTONE Take 0.5 tablets (12.5 mg total) by mouth daily.   tamsulosin 0.4 MG Caps capsule Commonly known as: FLOMAX TAKE 1 CAPSULE BY MOUTH  DAILY               Durable Medical Equipment   (From admission, onward)           Start     Ordered   02/18/21 1128  For home use only DME Walker rolling  Once       Question Answer Comment  Walker: With [place] Wheels   Patient needs a walker to treat with the following condition Weakness      02/18/21 1127            Follow-up Information     [facility] Follow up.   Specialty: Home Health Services Why: For home health PT OT and RN. They will call you in the next 1-2 days to set up a time to come out to your home Contact information: [address] [place] [address] [phone]         Garvin Fila, MD. Call in 1 week(s).   Specialties:  Neurology, Radiology Contact information: [address] [place] [address] [phone]         Hoyt Koch, MD Follow up in 1 week(s).   Specialty: Internal Medicine Contact information: [place] [address] [phone]         Almyra Deforest, [place] Follow up.   Specialties: Cardiology, Radiology Why: Hospital follow-up with Cardiology scheduled for 03/13/2021 at 8:45am. Please arrive 15 minutes early for check-in. If this date/time does not work for you, please call our office to reschedule. Contact information: [address] [place] [address] [phone]                Allergies  Allergen Reactions   Codeine Other (See Comments)    Causes bad constipation   Dust Mite Extract Cough   Pollen Extract Cough    Consultations: Neurology Cardiology   Procedures/Studies: MR BRAIN WO CONTRAST  Result Date: 02/17/2021 CLINICAL DATA:  Neuro deficit, acute, stroke suspected EXAM: MRI HEAD WITHOUT CONTRAST TECHNIQUE: Multiplanar, multiecho pulse sequences of the brain and surrounding structures were obtained without intravenous contrast. COMPARISON:  None. FINDINGS: Brain: 7 mm focus of reduced diffusion is present at the parasagittal left pontomesencephalic  junction. There is no intracranial hemorrhage. There is no intracranial mass, mass effect, or edema. There is no hydrocephalus or extra-axial fluid collection. Prominence of the ventricles and sulci reflects generalized parenchymal volume loss. Patchy T2 hyperintensity in the supratentorial white matter is nonspecific may reflect minor chronic microvascular ischemic changes. Chronic small vessel infarct near the genu of the right internal capsule. Chronic small vessel infarct of the right caudate body. Vascular: Major vessel flow voids at the skull base are preserved. Skull and upper cervical spine: Normal marrow signal is preserved. Sinuses/Orbits: Minor mucosal thickening.  Orbits are unremarkable. Other: Sella is unremarkable. Patchy left mastoid fluid opacification. IMPRESSION: Small acute infarct at the junction of the pons and midbrain on the left, which could potentially account for reported visual symptoms. Electronically Signed   By: Macy Mis M.D.   On: 02/17/2021 14:30   ECHOCARDIOGRAM COMPLETE  Result Date: 02/15/2021    ECHOCARDIOGRAM REPORT   Patient Name:   KEMPER HEUPEL Date of Exam: 02/15/2021 Medical Rec #:  [id number]      Height:       70.0 in Accession #:    [id number]     Weight:       229.7 lb Date of Birth:  04/10/1951      BSA:          2.214 m Patient Age:    60 years       BP:           162/95 mmHg Patient Gender: M              HR:           69 bpm. Exam Location:  Inpatient Procedure: 2D Echo, Cardiac Doppler, Color Doppler and Intracardiac            Opacification Agent Indications:    Stroke I63.9  History:        Patient has prior history of Echocardiogram examinations, most                 recent 01/26/2020. CAD, Signs/Symptoms:Murmur; Risk Factors:Sleep                 Apnea, Hypertension, Dyslipidemia and Diabetes. Hx. of V. fib  arrest. Chronic kidney disease.                  Mitral Valve: 30 mm prosthetic annuloplasty ring valve is                 present  in the mitral position. Procedure Date: 09/27/2012.  Sonographer:    Darlina Sicilian RDCS Referring Phys: [id number] [place]  1. 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 The EF drop is new. The wall motion abnormalities are new. Findings could represent stress induced cardiomyopathy vs LAD infarction, clinical correlation is recommended. Left ventricular ejection fraction, by estimation, is 30 to 35%. The left ventricle has moderately decreased function. The left ventricle demonstrates regional wall motion abnormalities (see scoring diagram/findings for description). The left ventricular internal cavity size was severely dilated. Left ventricular diastolic function could not be evaluated.  2. Right ventricular systolic function is normal. The right ventricular size is normal. Tricuspid regurgitation signal is inadequate for assessing PA pressure.  3. The mitral valve has been repaired/replaced. No evidence of mitral valve regurgitation. No evidence of mitral stenosis. There is a 30 mm prosthetic annuloplasty ring present in the mitral position. Procedure Date: 09/27/2012. Echo findings are consistent with normal structure and function of the mitral valve prosthesis.  4. The aortic valve is tricuspid. Aortic valve regurgitation is trivial. No aortic stenosis is present. Comparison(s): Changes from prior study are noted. EF is now reduced. New WMA described above. Conclusion(s)/Recommendation(s): No intracardiac source of embolism detected on this transthoracic study. A transesophageal echocardiogram is recommended to exclude cardiac source of embolism if clinically indicated. No left ventricular mural or apical thrombus/thrombi. 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 The EF drop is new. The wall motion abnormalities are new. Findings could represent stress induced cardiomyopathy vs LAD infarction, clinical correlation is recommended. Left  ventricular ejection fraction, by estimation, is 30 to 35%. The left ventricle has moderately decreased function. The left ventricle demonstrates regional wall motion abnormalities. Definity contrast agent was given IV to delineate the left ventricular endocardial borders. The left ventricular internal cavity size was severely dilated. There is no left ventricular hypertrophy. Left ventricular diastolic function could not be evaluated due to mitral valve repair. Left ventricular diastolic function could not be evaluated.  LV Wall Scoring: The entire apex is akinetic. Right Ventricle: The right ventricular size is normal. No increase in right ventricular wall thickness. Right ventricular systolic function is normal. Tricuspid regurgitation signal is inadequate for assessing PA pressure. Left Atrium: Left atrial size was normal in size. Right Atrium: Right atrial size was normal in size. Pericardium: Trivial pericardial effusion is present. Mitral Valve: The mitral valve has been repaired/replaced. No evidence of mitral valve regurgitation. There is a 30 mm prosthetic annuloplasty ring present in the mitral position. Procedure Date: 09/27/2012. Echo findings are consistent with normal structure and function of the mitral valve prosthesis. No evidence of mitral valve stenosis. MV peak gradient, 9.4 mmHg. The mean mitral valve gradient is 4.5 mmHg. Tricuspid Valve: The tricuspid valve is grossly normal. Tricuspid valve regurgitation is not demonstrated. No evidence of tricuspid stenosis. Aortic Valve: The aortic valve is tricuspid. Aortic valve regurgitation is trivial. No aortic stenosis is present. Pulmonic Valve: The pulmonic valve was grossly normal. Pulmonic valve regurgitation is trivial. No evidence of pulmonic stenosis. Aorta: The aortic root and ascending aorta are structurally normal, with no evidence of dilitation. Venous: The inferior vena cava  was not well visualized. IAS/Shunts: The atrial septum is  grossly normal. Additional Comments: A device lead is visualized in the right atrium and right ventricle.  LEFT VENTRICLE PLAX 2D LVIDd:         6.80 cm      Diastology LVIDs:         5.40 cm      LV e' medial:    4.49 cm/s LV PW:         0.90 cm      LV E/e' medial:  19.6 LV IVS:        0.90 cm      LV e' lateral:   4.33 cm/s LVOT diam:     2.10 cm      LV E/e' lateral: 20.3 LV SV:         46 LV SV Index:   21 LVOT Area:     3.46 cm  LV Volumes (MOD) LV vol d, MOD A2C: 169.0 ml LV vol d, MOD A4C: 259.0 ml LV vol s, MOD A2C: 110.0 ml LV vol s, MOD A4C: 143.0 ml LV SV MOD A2C:     59.0 ml LV SV MOD A4C:     259.0 ml LV SV MOD BP:      86.1 ml RIGHT VENTRICLE RV S prime:     14.10 cm/s TAPSE (M-mode): 2.2 cm LEFT ATRIUM             Index       RIGHT ATRIUM           Index LA diam:        4.20 cm 1.90 cm/m  RA Area:     13.00 cm LA Vol (A2C):   13.5 ml 6.10 ml/m  RA Volume:   27.10 ml  12.24 ml/m LA Vol (A4C):   25.5 ml 11.52 ml/m LA Biplane Vol: 20.0 ml 9.03 ml/m  AORTIC VALVE             PULMONIC VALVE LVOT Vmax:   84.70 cm/s  PV Vmax:          2.09 m/s LVOT Vmean:  57.600 cm/s PV Peak grad:     17.5 mmHg LVOT VTI:    0.133 m     PR End Diast Vel: 9.55 msec  AORTA Ao Root diam: 3.50 cm Ao Asc diam:  3.30 cm MITRAL VALVE MV Area (PHT): 1.51 cm     SHUNTS MV Area VTI:   0.97 cm     Systemic VTI:  0.13 m MV Peak grad:  9.4 mmHg     Systemic Diam: 2.10 cm MV Mean grad:  4.5 mmHg MV Vmax:       1.53 m/s MV Vmean:      99.8 cm/s MV Decel Time: 504 msec MV E velocity: 88.10 cm/s MV A velocity: 137.00 cm/s MV E/A ratio:  0.64 Eleonore Chiquito MD Electronically signed by Eleonore Chiquito MD Signature Date/Time: 02/15/2021/3:17:36 PM    Final    CT ANGIO HEAD CODE STROKE  Result Date: 02/14/2021 CLINICAL DATA:  Initial evaluation for acute dizziness, double vision. EXAM: CT ANGIOGRAPHY HEAD AND NECK TECHNIQUE: Multidetector CT imaging of the head and neck was performed using the standard protocol during bolus  administration of intravenous contrast. Multiplanar CT image reconstructions and MIPs were obtained to evaluate the vascular anatomy. Carotid stenosis measurements (when applicable) are obtained utilizing NASCET criteria, using the distal internal carotid diameter as the denominator. CONTRAST:  27mL OMNIPAQUE IOHEXOL  350 MG/ML SOLN COMPARISON:  Prior CTA from 03/31/2020. FINDINGS: CT HEAD FINDINGS Brain: Cerebral volume within normal limits. Mild chronic microvascular ischemic disease noted involving the supratentorial cerebral white matter. 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 No acute intracranial hemorrhage. No acute large vessel territory infarct. No mass lesion or mass effect. No hydrocephalus or extra-axial fluid collection. Vascular: No hyperdense vessel. Skull: Scalp soft tissues and calvarium within normal limits. Sinuses: Mild chronic right sphenoid sinusitis noted. Paranasal sinuses are otherwise clear. No mastoid effusion. Orbits: Unremarkable. Review of the MIP images confirms the above findings CTA NECK FINDINGS Aortic arch: Visualized aortic arch normal in caliber with normal branch pattern. Mild atheromatous change about the arch itself. No hemodynamically significant stenosis about the origin of the great vessels. Right carotid system: Right common and internal carotid arteries widely patent without stenosis, dissection or occlusion. Minimal plaque about the right carotid bulb without stenosis. Left carotid system: Left common and internal carotid arteries widely patent without stenosis, dissection or occlusion. Vertebral arteries: Both vertebral arteries arise from the subclavian arteries. No proximal subclavian artery stenosis. Both vertebral arteries widely patent without stenosis, dissection or occlusion. Skeleton: Mild-to-moderate multilevel cervical spondylosis without high-grade spinal stenosis. No worrisome osseous lesions. Other neck: No other acute soft  tissue abnormality within the neck. No mass or adenopathy. 3 mm left thyroid nodule noted, of doubtful significance given size and patient age, no follow-up imaging recommended (ref: J Am Coll Radiol. 2015 Feb;12(2): 143-50). Upper chest: Left-sided pacemaker/AICD partially visualized. Scattered atelectatic changes with mild peribronchial thickening noted within the visualized lungs. Review of the MIP images confirms the above findings CTA HEAD FINDINGS Anterior circulation: Both internal carotid arteries widely patent to the termini without stenosis. A1 segments widely patent. Normal anterior communicating artery complex. Both anterior cerebral arteries widely patent to their distal aspects without stenosis. No M1 stenosis or occlusion. Normal MCA bifurcations. Distal MCA branches well perfused and symmetric. Posterior circulation: Both vertebral arteries patent to the vertebrobasilar junction without stenosis. Neither PICA origin well visualized. Basilar widely patent to its distal aspect without stenosis. Superior cerebral arteries patent bilaterally. Left PCA supplied via the basilar. Right PCA supplied via a hypoplastic right P1 segment and robust right posterior communicating artery. Both PCAs perfused to their distal aspects without stenosis. Venous sinuses: Grossly patent allowing for timing the contrast bolus. Anatomic variants: None significant.  No aneurysm. Review of the MIP images confirms the above findings IMPRESSION: CT HEAD IMPRESSION: 1. No acute intracranial abnormality. 2. Mild chronic microvascular ischemic disease with small remote lacunar infarct at the genu of the right internal capsule. CTA HEAD AND NECK IMPRESSION: Normal CTA of the head and neck. No large vessel occlusion, hemodynamically significant stenosis, or other acute vascular abnormality. Electronically Signed   By: Jeannine Boga M.D.   On: 02/14/2021 20:34   CT ANGIO NECK CODE STROKE  Result Date: 02/14/2021 CLINICAL  DATA:  Initial evaluation for acute dizziness, double vision. EXAM: CT ANGIOGRAPHY HEAD AND NECK TECHNIQUE: Multidetector CT imaging of the head and neck was performed using the standard protocol during bolus administration of intravenous contrast. Multiplanar CT image reconstructions and MIPs were obtained to evaluate the vascular anatomy. Carotid stenosis measurements (when applicable) are obtained utilizing NASCET criteria, using the distal internal carotid diameter as the denominator. CONTRAST:  4mL OMNIPAQUE IOHEXOL 350 MG/ML SOLN COMPARISON:  Prior CTA from 03/31/2020. FINDINGS: CT HEAD FINDINGS Brain: Cerebral volume within normal limits. Mild chronic microvascular ischemic disease  noted involving the supratentorial cerebral white matter. 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 No acute intracranial hemorrhage. No acute large vessel territory infarct. No mass lesion or mass effect. No hydrocephalus or extra-axial fluid collection. Vascular: No hyperdense vessel. Skull: Scalp soft tissues and calvarium within normal limits. Sinuses: Mild chronic right sphenoid sinusitis noted. Paranasal sinuses are otherwise clear. No mastoid effusion. Orbits: Unremarkable. Review of the MIP images confirms the above findings CTA NECK FINDINGS Aortic arch: Visualized aortic arch normal in caliber with normal branch pattern. Mild atheromatous change about the arch itself. No hemodynamically significant stenosis about the origin of the great vessels. Right carotid system: Right common and internal carotid arteries widely patent without stenosis, dissection or occlusion. Minimal plaque about the right carotid bulb without stenosis. Left carotid system: Left common and internal carotid arteries widely patent without stenosis, dissection or occlusion. Vertebral arteries: Both vertebral arteries arise from the subclavian arteries. No proximal subclavian artery stenosis. Both vertebral arteries  widely patent without stenosis, dissection or occlusion. Skeleton: Mild-to-moderate multilevel cervical spondylosis without high-grade spinal stenosis. No worrisome osseous lesions. Other neck: No other acute soft tissue abnormality within the neck. No mass or adenopathy. 3 mm left thyroid nodule noted, of doubtful significance given size and patient age, no follow-up imaging recommended (ref: J Am Coll Radiol. 2015 Feb;12(2): 143-50). Upper chest: Left-sided pacemaker/AICD partially visualized. Scattered atelectatic changes with mild peribronchial thickening noted within the visualized lungs. Review of the MIP images confirms the above findings CTA HEAD FINDINGS Anterior circulation: Both internal carotid arteries widely patent to the termini without stenosis. A1 segments widely patent. Normal anterior communicating artery complex. Both anterior cerebral arteries widely patent to their distal aspects without stenosis. No M1 stenosis or occlusion. Normal MCA bifurcations. Distal MCA branches well perfused and symmetric. Posterior circulation: Both vertebral arteries patent to the vertebrobasilar junction without stenosis. Neither PICA origin well visualized. Basilar widely patent to its distal aspect without stenosis. Superior cerebral arteries patent bilaterally. Left PCA supplied via the basilar. Right PCA supplied via a hypoplastic right P1 segment and robust right posterior communicating artery. Both PCAs perfused to their distal aspects without stenosis. Venous sinuses: Grossly patent allowing for timing the contrast bolus. Anatomic variants: None significant.  No aneurysm. Review of the MIP images confirms the above findings IMPRESSION: CT HEAD IMPRESSION: 1. No acute intracranial abnormality. 2. Mild chronic microvascular ischemic disease with small remote lacunar infarct at the genu of the right internal capsule. CTA HEAD AND NECK IMPRESSION: Normal CTA of the head and neck. No large vessel occlusion,  hemodynamically significant stenosis, or other acute vascular abnormality. Electronically Signed   By: Jeannine Boga M.D.   On: 02/14/2021 20:34     Subjective: He think plavix might cause dizziness , stomach upset. Dr [name], think unlikely plavix is causing this symptoms. Plan to monitor. He can contact Dr [name], if he think he cant tolerate plavix.   Diplopia stable.   Discharge Exam: Vitals:   02/18/21 0733 02/18/21 1155  BP: (!) 158/94 (!) 136/99  Pulse: 67 71  Resp: 20 19  Temp: 98 F (36.7 C) 97.8 F (36.6 C)  SpO2:  100%     General: Pt is alert, awake, not in acute distress Cardiovascular: RRR, S1/S2 +, no rubs, no gallops Respiratory: CTA bilaterally, no wheezing, no rhonchi Abdominal: Soft, NT, ND, bowel sounds + Extremities: no edema, no cyanosis    The results of significant diagnostics from this hospitalization (including  imaging, microbiology, ancillary and laboratory) are listed below for reference.     Microbiology: Recent Results (from the past 240 hour(s))  Resp Panel by RT-PCR (Flu A&B, Covid) Nasopharyngeal Swab     Status: None   Collection Time: 02/14/21 10:16 PM   Specimen: Nasopharyngeal Swab; Nasopharyngeal(NP) swabs in vial transport medium  Result Value Ref Range Status   SARS Coronavirus 2 by RT PCR NEGATIVE NEGATIVE Final    Comment: (NOTE) SARS-CoV-2 target nucleic acids are NOT DETECTED.  The SARS-CoV-2 RNA is generally detectable in upper respiratory specimens during the acute phase of infection. The lowest concentration of SARS-CoV-2 viral copies this assay can detect is 138 copies/mL. A negative result does not preclude SARS-Cov-2 infection and should not be used as the sole basis for treatment or other patient management decisions. A negative result may occur with  improper specimen collection/handling, submission of specimen other than nasopharyngeal swab, presence of viral mutation(s) within the areas targeted by this  assay, and inadequate number of viral copies(<138 copies/mL). A negative result must be combined with clinical observations, patient history, and epidemiological information. The expected result is Negative.  Fact Sheet for Patients:  EntrepreneurPulse.com.au  Fact Sheet for Healthcare Providers:  IncredibleEmployment.be  This test is no t yet approved or cleared by the [place] FDA and  has been authorized for detection and/or diagnosis of SARS-CoV-2 by FDA under an Emergency Use Authorization (EUA). This EUA will remain  in effect (meaning this test can be used) for the duration of the COVID-19 declaration under Section 564(b)(1) of the Act, 21 U.S.C.section 360bbb-3(b)(1), unless the authorization is terminated  or revoked sooner.       Influenza A by PCR NEGATIVE NEGATIVE Final   Influenza B by PCR NEGATIVE NEGATIVE Final    Comment: (NOTE) The Xpert Xpress SARS-CoV-2/FLU/RSV plus assay is intended as an aid in the diagnosis of influenza from Nasopharyngeal swab specimens and should not be used as a sole basis for treatment. Nasal washings and aspirates are unacceptable for Xpert Xpress SARS-CoV-2/FLU/RSV testing.  Fact Sheet for Patients: EntrepreneurPulse.com.au  Fact Sheet for Healthcare Providers: IncredibleEmployment.be  This test is not yet approved or cleared by the [place] FDA and has been authorized for detection and/or diagnosis of SARS-CoV-2 by FDA under an Emergency Use Authorization (EUA). This EUA will remain in effect (meaning this test can be used) for the duration of the COVID-19 declaration under Section 564(b)(1) of the Act, 21 U.S.C. section 360bbb-3(b)(1), unless the authorization is terminated or revoked.  Performed at [facility], [address] [place], [place] [address]      Labs: BNP (last 3 results) No results for input(s): BNP in the  last 8760 hours. Basic Metabolic Panel: Recent Labs  Lab 02/14/21 1858 02/15/21 0811 02/16/21 0033 02/17/21 1155 02/18/21 0100  NA 133* 134* 135 136 137  K 3.4* 3.5 3.9 3.2* 3.4*  CL 96* 101 99 101 105  CO2 $Re'27 24 27 26 22  'WOE$ GLUCOSE 377* 346* 242* 234* 172*  BUN $Re'11 11 16 14 13  'mHt$ CREATININE 0.94 0.90 1.09 0.95 1.04  CALCIUM 9.0 8.9 9.5 9.0 8.8*   Liver Function Tests: No results for input(s): AST, ALT, ALKPHOS, BILITOT, PROT, ALBUMIN in the last 168 hours. No results for input(s): LIPASE, AMYLASE in the last 168 hours. No results for input(s): AMMONIA in the last 168 hours. CBC: Recent Labs  Lab 02/14/21 1858 02/16/21 0033  WBC 5.7 6.3  HGB 14.6 14.4  HCT 42.1  41.3  MCV 84.5 85.7  PLT 168 147*   Cardiac Enzymes: No results for input(s): CKTOTAL, CKMB, CKMBINDEX, TROPONINI in the last 168 hours. BNP: Invalid input(s): POCBNP CBG: Recent Labs  Lab 02/17/21 1206 02/17/21 1732 02/17/21 2008 02/18/21 0758 02/18/21 1154  GLUCAP 211* 190* 274* 268* 243*   D-Dimer No results for input(s): DDIMER in the last 72 hours. Hgb A1c No results for input(s): HGBA1C in the last 72 hours. Lipid Profile No results for input(s): CHOL, HDL, LDLCALC, TRIG, CHOLHDL, LDLDIRECT in the last 72 hours. Thyroid function studies No results for input(s): TSH, T4TOTAL, T3FREE, THYROIDAB in the last 72 hours.  Invalid input(s): FREET3 Anemia work up No results for input(s): VITAMINB12, FOLATE, FERRITIN, TIBC, IRON, RETICCTPCT in the last 72 hours. Urinalysis    Component Value Date/Time   COLORURINE YELLOW 02/14/2021 1858   APPEARANCEUR CLEAR 02/14/2021 1858   LABSPEC 1.027 02/14/2021 1858   PHURINE 7.0 02/14/2021 1858   GLUCOSEU >1,000 (A) 02/14/2021 1858   HGBUR NEGATIVE 02/14/2021 1858   BILIRUBINUR NEGATIVE 02/14/2021 1858   BILIRUBINUR small 05/03/2013 0845   KETONESUR NEGATIVE 02/14/2021 1858   PROTEINUR NEGATIVE 02/14/2021 1858   UROBILINOGEN 0.2 05/03/2013 0845    UROBILINOGEN 1.0 09/23/2012 1515   NITRITE NEGATIVE 02/14/2021 1858   LEUKOCYTESUR NEGATIVE 02/14/2021 1858   Sepsis Labs Invalid input(s): PROCALCITONIN,  WBC,  LACTICIDVEN Microbiology Recent Results (from the past 240 hour(s))  Resp Panel by RT-PCR (Flu A&B, Covid) Nasopharyngeal Swab     Status: None   Collection Time: 02/14/21 10:16 PM   Specimen: Nasopharyngeal Swab; Nasopharyngeal(NP) swabs in vial transport medium  Result Value Ref Range Status   SARS Coronavirus 2 by RT PCR NEGATIVE NEGATIVE Final    Comment: (NOTE) SARS-CoV-2 target nucleic acids are NOT DETECTED.  The SARS-CoV-2 RNA is generally detectable in upper respiratory specimens during the acute phase of infection. The lowest concentration of SARS-CoV-2 viral copies this assay can detect is 138 copies/mL. A negative result does not preclude SARS-Cov-2 infection and should not be used as the sole basis for treatment or other patient management decisions. A negative result may occur with  improper specimen collection/handling, submission of specimen other than nasopharyngeal swab, presence of viral mutation(s) within the areas targeted by this assay, and inadequate number of viral copies(<138 copies/mL). A negative result must be combined with clinical observations, patient history, and epidemiological information. The expected result is Negative.  Fact Sheet for Patients:  EntrepreneurPulse.com.au  Fact Sheet for Healthcare Providers:  IncredibleEmployment.be  This test is no t yet approved or cleared by the [place] FDA and  has been authorized for detection and/or diagnosis of SARS-CoV-2 by FDA under an Emergency Use Authorization (EUA). This EUA will remain  in effect (meaning this test can be used) for the duration of the COVID-19 declaration under Section 564(b)(1) of the Act, 21 U.S.C.section 360bbb-3(b)(1), unless the authorization is terminated  or revoked  sooner.       Influenza A by PCR NEGATIVE NEGATIVE Final   Influenza B by PCR NEGATIVE NEGATIVE Final    Comment: (NOTE) The Xpert Xpress SARS-CoV-2/FLU/RSV plus assay is intended as an aid in the diagnosis of influenza from Nasopharyngeal swab specimens and should not be used as a sole basis for treatment. Nasal washings and aspirates are unacceptable for Xpert Xpress SARS-CoV-2/FLU/RSV testing.  Fact Sheet for Patients: EntrepreneurPulse.com.au  Fact Sheet for Healthcare Providers: IncredibleEmployment.be  This test is not yet approved or cleared by the [place] FDA  and has been authorized for detection and/or diagnosis of SARS-CoV-2 by FDA under an Emergency Use Authorization (EUA). This EUA will remain in effect (meaning this test can be used) for the duration of the COVID-19 declaration under Section 564(b)(1) of the Act, 21 U.S.C. section 360bbb-3(b)(1), unless the authorization is terminated or revoked.  Performed at [facility], [address], [place], [place] [address]      Time coordinating discharge: 40 minutes  SIGNED:   Elmarie Shiley, MD  [facility]

## 2021-02-18 NOTE — Progress Notes (Signed)
EPIC Encounter for ICM Monitoring  Patient Name: Christopher [name] is a 70 y.o. male Date: 02/18/2021 Primary Care Physican: Hoyt Koch, MD Primary Cardiologist: Meda Coffee Electrophysiologist: Allred 01/15/2021 Office Weight: 237 lbs                                Pt currently hospitalized.   Optivol thoracic impedance normal.    No diuretic   Labs: 02/18/2021 Creatinine 1.04, BUN 13, Potassium 3.4, Sodium 137, GFR >60 02/17/2021 Creatinine 0.95, BUN 14, Potassium 3.2, Sodium 136, GFR >60  02/16/2021 Creatinine 1.09, BUN 16, Potassium 3.9, Sodium 135, GFR >60  02/15/2021 Creatinine 0.90, BUN 11, Potassium 3.5, Sodium 134, GFR >60  02/14/2021 Creatinine 0.94, BUN 11, Potassium 3.4, Sodium 133, GFR >60  A complete set of results can be found in Results Review.   Recommendations:  No changes.   Follow-up plan: ICM clinic phone appointment on 03/21/2021.   91 day device clinic remote transmission 05/19/2021.      EP/Cardiology Office Visits: Recall 12/28/2020 with Dr Rayann Heman.   Patient aware to call Dr Francesca Oman office for appointment (last visit 07/2019).     Copy of ICM check sent to Dr. Rayann Heman.     3 month ICM trend: 02/17/2021.    1 Year ICM trend:       Rosalene Billings, RN 02/18/2021 10:20 AM

## 2021-02-18 NOTE — Progress Notes (Signed)
STROKE TEAM PROGRESS NOTE   INTERVAL HISTORY He is laying in the bed, in NAD. Is using eye patch.  His wife is at bedside. He does not voice any concerns, states his diplopia is slightly improved.  Echocardiogram shows diminished ejection fraction of 30 to 35%.  Cardiology has been consulted Vitals:   02/17/21 2310 02/18/21 0417 02/18/21 0733 02/18/21 1155  BP: 123/75 (!) 162/75 (!) 158/94 (!) 136/99  Pulse: 65  67 71  Resp: '20 18 20 19  '$ Temp: 98 F (36.7 C) 98.3 F (36.8 C) 98 F (36.7 C) 97.8 F (36.6 C)  TempSrc: Axillary Axillary Oral Oral  SpO2: 95% 96%  100%  Weight:      Height:       CBC:  Recent Labs  Lab 02/14/21 1858 02/16/21 0033  WBC 5.7 6.3  HGB 14.6 14.4  HCT 42.1 41.3  MCV 84.5 85.7  PLT 168 [id number]*   Basic Metabolic Panel:  Recent Labs  Lab 02/17/21 1155 02/18/21 0100  NA 136 137  K 3.2* 3.4*  CL 101 105  CO2 26 22  GLUCOSE 234* 172*  BUN 14 13  CREATININE 0.95 1.04  CALCIUM 9.0 8.8*   Lipid Panel:  Recent Labs  Lab 02/15/21 0458  CHOL 125  TRIG 303*  HDL 30*  CHOLHDL 4.2  VLDL 61*  LDLCALC 34   HgbA1c:  Recent Labs  Lab 02/15/21 0458  HGBA1C 11.5*     IMAGING past 24 hours MR BRAIN WO CONTRAST  Result Date: 02/17/2021 CLINICAL DATA:  Neuro deficit, acute, stroke suspected EXAM: MRI HEAD WITHOUT CONTRAST TECHNIQUE: Multiplanar, multiecho pulse sequences of the brain and surrounding structures were obtained without intravenous contrast. COMPARISON:  None. FINDINGS: Brain: 7 mm focus of reduced diffusion is present at the parasagittal left pontomesencephalic junction. There is no intracranial hemorrhage. There is no intracranial mass, mass effect, or edema. There is no hydrocephalus or extra-axial fluid collection. Prominence of the ventricles and sulci reflects generalized parenchymal volume loss. Patchy T2 hyperintensity in the supratentorial white matter is nonspecific may reflect minor chronic microvascular ischemic changes. Chronic  small vessel infarct near the genu of the right internal capsule. Chronic small vessel infarct of the right caudate body. Vascular: Major vessel flow voids at the skull base are preserved. Skull and upper cervical spine: Normal marrow signal is preserved. Sinuses/Orbits: Minor mucosal thickening.  Orbits are unremarkable. Other: Sella is unremarkable. Patchy left mastoid fluid opacification. IMPRESSION: Small acute infarct at the junction of the pons and midbrain on the left, which could potentially account for reported visual symptoms. Electronically Signed   By: Macy Mis M.D.   On: 02/17/2021 14:30    PHYSICAL EXAM  Physical Exam  Constitutional: Appears well-developed and well-nourished.  Elderly Caucasian male not in distress Psych: Affect appropriate to situation Eyes: No scleral injection HENT: No OP obstrucion MSK: no joint deformities.  Cardiovascular: Normal rate and regular rhythm.  Respiratory: Effort normal, non-labored breathing GI: Soft.  No distension. There is no tenderness.  Skin: WDI  Neuro: Mental Status: Patient is awake, alert, oriented to person, place, month, year, and situation. Patient is able to give a clear and coherent history. No signs of aphasia or neglect Cranial Nerves: II: Visual Fields are full. Pupils are equal, round, and reactive to light.   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  No nystagmus on right lateral gaze V: Facial sensation is symmetric to temperature VII: Facial movement is symmetric.  VIII: hearing is intact to voice X: Uvula elevates symmetrically XI: Shoulder shrug is symmetric. XII: tongue is midline without atrophy or fasciculations.  Motor: Tone is normal. Bulk is normal. 5/5 strength was present in all four extremities.  Sensory: Sensation is symmetric to light touch and temperature in the arms and  legs. Cerebellar: FNF and HKS are intact bilaterally   ASSESSMENT/PLAN [name] is a 70 y.o. male with history of CAD status post DES to RCA 2014, paroxysmal A. fib not on anticoagulation due to a history of thigh  hematoma, V. 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 He reports that he was using his computer on Thursday morning when he felt dizzy and began to have trouble seeing with a double vision.  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 2D Echo  1. 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 The EF drop is new. The wall motion abnormalities are new. Findings could represent stress induced cardiomyopathy vs LAD infarction, clinical correlation is  recommended. Left ventricular ejection fraction, by estimation, is 30 to 35%. The left ventricle has moderately decreased function. The left ventricle demonstrates regional  wall motion abnormalities (see scoring diagram/findings for description). The left ventricular internal cavity size was severely dilated. Left ventricular diastolic function could not  be evaluated.   2. Right ventricular systolic function is normal. The right ventricular size is normal. Tricuspid regurgitation signal is inadequate for assessing PA pressure.   3. The mitral valve has been repaired/replaced. No evidence of mitral valve regurgitation. No evidence of mitral stenosis. There is a 30 mm prosthetic annuloplasty ring present in the mitral position. Procedure  Date: 09/27/2012. Echo findings are consistent with normal structure and function of the mitral valve prosthesis.   4. The aortic valve is tricuspid. Aortic valve regurgitation is trivial. No aortic stenosis is present.  - cardiology consulted  LDL 34 HgbA1c 11. VTE prophylaxis -SCD's, Lovenox    Diet   Diet heart healthy/carb modified Room service appropriate? Yes; Fluid consistency: Thin   aspirin 81 mg daily prior to admission, now on aspirin 81 mg daily and clopidogrel 75 mg daily. For 3 weeks then Plavix alone Therapy recommendations:  pending Disposition:  pending therapy recs  Hypertension Home meds:  Norvasc '5mg'$ , metoprolol XL '100mg'$  daily and ramipril '10mg'$  daily Stable Permissive hypertension (OK if < 220/120) but gradually normalize in 5-7 days Long-term BP goal normotensive  Atrial Fibrillation  Not on AC  Continue metoprolol  Hyperlipidemia Home meds:  lipitor '40mg'$  , resumed in hospital LDL 34, goal < 70 Continue statin at discharge  Diabetes type II UnControlled Home meds:  metformin HgbA1c 11.5, goal < 7.0 CBGs Recent Labs    02/17/21 2008 02/18/21 0758 02/18/21 1154  GLUCAP 274* 268* 243*    SSI  Other Stroke Risk Factors Advanced Age >/= 65  Obesity, Body mass index is 32.9 kg/m., BMI >/=  30 associated with increased stroke risk, recommend weight loss, diet and exercise as appropriate  Coronary artery disease Obstructive sleep apnea, on CPAP at home Congestive heart failure  Other Active Problems BPH on Flomax   Hospital day # 0    Patient presented with sudden onset of diplopia as well as gait ataxia and dizziness secondary to paramedian left pontine/midbrain lacunar infarct likely from small vessel disease.  Recommend dual antiplatelet therapy of aspirin and Plavix for 3 weeks followed by Plavix alone and aggressive risk factor modification.    Continue CPAP for his sleep apnea.  Continue taping his glasses to help with his diplopia and if it  does not improve in 2 to 3 months may consider prisms.  Greater than 50% time during this 25-minute visit was spent on counseling and coordination of care about his stroke and discussion about prevention and treatment and answering questions.  Discussed with patient, wife and Dr. Jerald Kief.  Stroke team will sign off.  Kindly call for questions.  Follow-up as an outpatient stroke clinic in 6 to 8 weeks  Antony Contras, [place] Pager: [phone] 02/18/2021 12:55 PM   To contact Stroke Continuity provider, please refer to [email or web address] After hours, contact General Neurology

## 2021-02-18 NOTE — TOC Transition Note (Signed)
Transition of Care [facility]) - CM/SW Discharge Note   Patient Details  Name: Christopher [name] MRN: [id number] Date of Birth: [birth year]  [facility] CM/SW Contact:  Carles Collet, RN Phone Number: 02/18/2021, 11:35 AM   Clinical Narrative:    [name] w patient. He is agreeable to [facility] services. HH RN for medication management and PT OT set up with [facility].  Patient also consented to [facility] CVA referral through [facility]. No other CM needs identified.     Final next level of care: [place] Barriers to Discharge: No Barriers Identified   Patient Goals and CMS Choice Patient states their goals for this hospitalization and ongoing recovery are:: to go home CMS Medicare.gov Compare Post Acute Care list provided to:: Patient Choice offered to / list presented to : Patient  Discharge Placement                       Discharge Plan and Services                DME Arranged: [place] rolling DME Agency: [employer] Date DME Agency Contacted: 02/18/21 Time DME Agency Contacted: [id number] Representative spoke with at DME Agency: Jodell Cipro HH Arranged: PT, RN, OT [place] Agency: [facility] [place] Date [place]: 02/18/21 Time [place]: [id number] Representative spoke with at [place]: [place] ([place]) Interventions     Readmission Risk Interventions No flowsheet data found.

## 2021-02-18 NOTE — Plan of Care (Signed)
Patient and sister education done.

## 2021-02-18 NOTE — Progress Notes (Signed)
Inpatient Diabetes Program Recommendations  AACE/ADA: New Consensus Statement on Inpatient Glycemic Control (2015)  Target Ranges:  Prepandial:   less than 140 mg/dL      Peak postprandial:   less than 180 mg/dL (1-2 hours)      Critically ill patients:  140 - 180 mg/dL   Lab Results  Component Value Date   GLUCAP 243 (H) 02/18/2021   HGBA1C 11.5 (H) 02/15/2021    Review of Glycemic Control Results for AAMIR, MCLINDEN" (MRN [id number]) as of 02/18/2021 15:58  Ref. Range 02/17/2021 20:08 02/18/2021 07:58 02/18/2021 11:54  Glucose-Capillary Latest Ref Range: 70 - 99 mg/dL 274 (H) 268 (H) 243 (H)   Diabetes history: type 2 DM Outpatient Diabetes medications: Metformin 1000 mg BID Current orders for Inpatient glycemic control: Semglee 30 units QD, Novolog 8 units TID, Novolog 0-20 units TID     Patient will need a meter at discharge. Blood glucose meter kit (includes strips and lancets) (#[id number]).   Spoke with patient and patient's sister again to further reinforce concepts discussed yesterday. Sister explains that patient's baseline is similar to normal, however depth perception off. Reviewed patient's current A1c of 11.5%. Explained what a A1c is and what it measures. Also reviewed goal A1c with patient, importance of good glucose control @ home, and blood sugar goals. Reviewed at length survival skills, principles of 70/30, [employer] options, interventions, and commorbidities.  When asked using teach back questions on interventions patient nonverbally frustrated and making inappropriate jokes. Seems confused by questions; then becomes frustrated. Initiated conversation of concern with responses and incorrect answers. Sister feels that instructions on refrigerator will be sufficient along with verbal cues by phone. Has been compliant in the past.  Has participated in self injection with nursing staff today and yesterday successfully.  Educated patient and sister on insulin pen use at  home. Reviewed contents of insulin flexpen starter kit. Reviewed all steps if insulin pen including attachment of needle, 2-unit air shot, dialing up dose, giving injection, removing needle, disposal of sharps, storage of unused insulin, disposal of insulin etc. Patient able to provide successful return demonstration. Also reviewed troubleshooting with insulin pen. MD to give patient Rxs for insulin pens and insulin pen needles. Discussed plan with Dr Tyrell Antonio to include Novolog 70/30 22 units BID at discharge.   Thanks, Bronson Curb, MSN, RNC-OB Diabetes Coordinator [phone] (8a-5p)

## 2021-02-18 NOTE — Discharge Instructions (Addendum)
Take aspirin for 3 weeks total then stop. **Last day 9/4**  Check Blood sugar Take insulin- Novolin or Novolog 70/30 22 units with Breakfast & Dinner EAT Report any blood sugars that exceed 200's to doctor OR less than 70 mg/dL Work on diet as discussed  If blood sugar less than 70 mg/dL, you may feel silly, sweaty, shaky, hungry.  Take 1/2 cup of juice OR regular soda Recheck blood sugar in 15 minutes to ensure returns to target goal

## 2021-02-19 ENCOUNTER — Telehealth: Payer: Medicare Other

## 2021-02-19 DIAGNOSIS — R42 Dizziness and giddiness: Secondary | ICD-10-CM | POA: Diagnosis not present

## 2021-02-19 DIAGNOSIS — E1122 Type 2 diabetes mellitus with diabetic chronic kidney disease: Secondary | ICD-10-CM | POA: Diagnosis not present

## 2021-02-19 DIAGNOSIS — I69398 Other sequelae of cerebral infarction: Secondary | ICD-10-CM | POA: Diagnosis not present

## 2021-02-19 DIAGNOSIS — I1310 Hypertensive heart and chronic kidney disease without heart failure, with stage 1 through stage 4 chronic kidney disease, or unspecified chronic kidney disease: Secondary | ICD-10-CM | POA: Diagnosis not present

## 2021-02-19 DIAGNOSIS — H538 Other visual disturbances: Secondary | ICD-10-CM | POA: Diagnosis not present

## 2021-02-19 LAB — CUP PACEART REMOTE DEVICE CHECK
Battery Remaining Longevity: 93 mo
Battery Voltage: 2.99 V
Brady Statistic RV Percent Paced: 0.01 %
Date Time Interrogation Session: 20220817003527
HighPow Impedance: 82 Ohm
Implantable Lead Implant Date: 20181102
Implantable Lead Location: 753860
Implantable Pulse Generator Implant Date: 20181102
Lead Channel Impedance Value: 475 Ohm
Lead Channel Impedance Value: 570 Ohm
Lead Channel Pacing Threshold Amplitude: 0.625 V
Lead Channel Pacing Threshold Pulse Width: 0.4 ms
Lead Channel Sensing Intrinsic Amplitude: 5.25 mV
Lead Channel Sensing Intrinsic Amplitude: 5.25 mV
Lead Channel Setting Pacing Amplitude: 2 V
Lead Channel Setting Pacing Pulse Width: 0.4 ms
Lead Channel Setting Sensing Sensitivity: 0.3 mV

## 2021-02-20 ENCOUNTER — Telehealth: Payer: Self-pay | Admitting: Internal Medicine

## 2021-02-20 DIAGNOSIS — E1122 Type 2 diabetes mellitus with diabetic chronic kidney disease: Secondary | ICD-10-CM | POA: Diagnosis not present

## 2021-02-20 DIAGNOSIS — R42 Dizziness and giddiness: Secondary | ICD-10-CM | POA: Diagnosis not present

## 2021-02-20 DIAGNOSIS — I1310 Hypertensive heart and chronic kidney disease without heart failure, with stage 1 through stage 4 chronic kidney disease, or unspecified chronic kidney disease: Secondary | ICD-10-CM | POA: Diagnosis not present

## 2021-02-20 DIAGNOSIS — H538 Other visual disturbances: Secondary | ICD-10-CM | POA: Diagnosis not present

## 2021-02-20 DIAGNOSIS — I69398 Other sequelae of cerebral infarction: Secondary | ICD-10-CM | POA: Diagnosis not present

## 2021-02-20 NOTE — Telephone Encounter (Signed)
   Almyra Free from [place] calling to request parameters for blood pressure and blood sugar  [place]ern[place] Name: [place] Name: Santina Evans Phone #: [phone] Service Requested: NURSING Frequency of Visits: [phone]

## 2021-02-21 ENCOUNTER — Telehealth: Payer: Self-pay | Admitting: Internal Medicine

## 2021-02-21 DIAGNOSIS — I1310 Hypertensive heart and chronic kidney disease without heart failure, with stage 1 through stage 4 chronic kidney disease, or unspecified chronic kidney disease: Secondary | ICD-10-CM | POA: Diagnosis not present

## 2021-02-21 DIAGNOSIS — E1122 Type 2 diabetes mellitus with diabetic chronic kidney disease: Secondary | ICD-10-CM | POA: Diagnosis not present

## 2021-02-21 DIAGNOSIS — I69398 Other sequelae of cerebral infarction: Secondary | ICD-10-CM | POA: Diagnosis not present

## 2021-02-21 DIAGNOSIS — R42 Dizziness and giddiness: Secondary | ICD-10-CM | POA: Diagnosis not present

## 2021-02-21 DIAGNOSIS — H538 Other visual disturbances: Secondary | ICD-10-CM | POA: Diagnosis not present

## 2021-02-21 NOTE — Telephone Encounter (Signed)
[place] Name: [facility] Agency Name: Santina Evans Phone #: [phone] Service Requested: OT Frequency of Visits: 1 week 5

## 2021-02-24 ENCOUNTER — Other Ambulatory Visit: Payer: Self-pay

## 2021-02-24 ENCOUNTER — Ambulatory Visit (INDEPENDENT_AMBULATORY_CARE_PROVIDER_SITE_OTHER): Payer: Medicare Other | Admitting: Pharmacist

## 2021-02-24 DIAGNOSIS — E118 Type 2 diabetes mellitus with unspecified complications: Secondary | ICD-10-CM

## 2021-02-24 DIAGNOSIS — I10 Essential (primary) hypertension: Secondary | ICD-10-CM

## 2021-02-24 DIAGNOSIS — E785 Hyperlipidemia, unspecified: Secondary | ICD-10-CM

## 2021-02-24 DIAGNOSIS — I480 Paroxysmal atrial fibrillation: Secondary | ICD-10-CM

## 2021-02-24 DIAGNOSIS — E1169 Type 2 diabetes mellitus with other specified complication: Secondary | ICD-10-CM

## 2021-02-24 DIAGNOSIS — Z8673 Personal history of transient ischemic attack (TIA), and cerebral infarction without residual deficits: Secondary | ICD-10-CM

## 2021-02-24 DIAGNOSIS — I2510 Atherosclerotic heart disease of native coronary artery without angina pectoris: Secondary | ICD-10-CM

## 2021-02-24 NOTE — Telephone Encounter (Signed)
See below

## 2021-02-24 NOTE — Telephone Encounter (Signed)
Noted  

## 2021-02-24 NOTE — Patient Instructions (Signed)
Visit Information  Phone number for Pharmacist: [phone]   Goals Addressed             This Visit's Progress    Manage My Medicine       Timeframe:  Long-Range Goal Priority:  Medium Start Date:    02/24/21                         Expected End Date:   02/24/22                    Follow Up Date Sept 2022   - call for medicine refill 2 or 3 days before it runs out - call if I am sick and can't take my medicine - keep a list of all the medicines I take; vitamins and herbals too - use a pillbox to sort medicine    Why is this important?   These steps will help you keep on track with your medicines.   Notes:      Monitor and Manage My Blood Sugar-Diabetes Type 2       Timeframe:  Long-Range Goal Priority:  High Start Date:      02/24/21                       Expected End Date:  07/05/21                     Follow Up Date Sept 2022   - check blood sugar at prescribed times - check blood sugar if I feel it is too high or too low - take the blood sugar meter to all doctor visits -Blood sugar < 70 to too low, if this occurs treat with 1/2 glass juice, regular soda or sugary snack then re-check sugar in 15 minutes    Why is this important?   Checking your blood sugar at home helps to keep it from getting very high or very low.  Writing the results in a diary or log helps the doctor know how to care for you.  Your blood sugar log should have the time, date and the results.  Also, write down the amount of insulin or other medicine that you take.  Other information, like what you ate, exercise done and how you were feeling, will also be helpful.     Notes:         Care Plan : [place]  Updates made by Charlton Haws, RPH since 02/24/2021 12:00 AM     Problem: Hypertension, Hyperlipidemia, Diabetes, Atrial Fibrillation, Heart Failure, and Coronary Artery Disease   Priority: High     Long-Range Goal: Disease management   Start Date: 02/24/2021   Expected End Date: 02/24/2022  This Visit's Progress: On track  Priority: High  Note:   Current Barriers:  Unable to independently monitor therapeutic efficacy Suboptimal therapeutic regimen for diabetes  Pharmacist Clinical Goal(s):  Patient will achieve adherence to monitoring guidelines and medication adherence to achieve therapeutic efficacy adhere to plan to optimize therapeutic regimen for diabetes as evidenced by report of adherence to recommended medication management changes through collaboration with PharmD and provider.   Interventions: 1:1 collaboration with Hoyt Koch, MD regarding development and update of comprehensive plan of care as evidenced by provider attestation and co-signature Inter-disciplinary care team collaboration (see longitudinal plan of care) Comprehensive medication review performed; medication list updated in electronic medical record  Heart Failure / Hypertension    HF Type: Diastolic (grade II dysfunction), mild LVH Last ejection fraction: 55-60% (01/26/2020) NYHA Class: I (no actitivty limitation)   BP goal is:  <130/80 Patient checks BP at home infrequently Patient home BP readings are ranging: 138/80   Patient has failed these meds in past: ramipril, amlodipine Patient is currently controlled on the following medications:  Metoprolol succinate 100 mg daily PM Entresto 24-26 mg BID Spironolactone 25 mg - 1/2 tab daily   We discussed: medication changes from hospital 8/16 - pt endorses compliance with changes (stopped amlodipine, ramipril and started Entresto, spironolactone as prescribed); he is not checking his own BP but [facility] nurse is coming a few times a week and checking    Plan: Continue current medications and control with diet and exercise   AFIB    Patient is currently rate controlled Patient has failed these meds in past: Eliquis, Xarelto, warfarin Patient is currently controlled on the following medications:   Metoprolol succinate 100 mg daily AM Aspirin 81 mg daily AM   We discussed: Pt is not on anticoagulation d/t hx of thigh hematoma while on Xarelto   Plan: Continue current medications   Hyperlipidemia / CAD    LDL goal < 70 CAD, NICM; stroke 02/2021  Patient has failed these meds in past: n/a Patient is currently controlled on the following medications:  Atorvastatin 40 mg daily PM Fenofibrate 145 mg daily AM Aspirin 81 mg daily Clopidogrel 75 mg daily   We discussed:  pt reports compliance with new clopidogrel; he voiced understanding of plan to stop aspirin after 3 weeks (Sept 4th); counseled on indication for clopidogrel and risk for bleeding   Plan: Continue current medications   Diabetes    A1c goal <7% Fasting BG goal 80-130 Post-prandial BG goal < 180  Recent home BG: AM - 131, 156, 112; before dinner - 180   Patient has failed these meds in past: Jardiance 25 mg (cost $400) Patient is currently controlled on the following medications: Metformin 1000 mg BID Novolog 70/30 22 units BID Testing supplies (Accu-Chek)   We discussed: A1c increased drastically between March and August (6.8 to 11.5%), pt cannot point to significant change in lifestyle; he reports he has started insulin as directed and is checking BG with new glucometer; he is struggling with lancets, unable to get enough blood sometimes; counseled on lancing technique and holding test strip to finger to siphon blood -counseled on A1c and blood sugar goals; how A1c is related to average BG; -counseled on benefits/risks of insulin use; discussed hypoglycemia at length; counseled on hypoglycemia prevention and treatment -may consider SGLT-2 or GLP-1 - pt assistance available. For now continue current therapy as BG stabilizes   Plan: Continue current medications  Patient Goals/Self-Care Activities Patient will:  - take medications as prescribed focus on medication adherence by routine check glucose daily,  document, and provide at future appointments check blood pressure 3x a week, document, and provide at future appointments      Patient verbalizes understanding of instructions provided today and agrees to view in [place].  Telephone follow up appointment with pharmacy team member scheduled for: 1 month  Charlene Brooke, PharmD, [place], CPP Clinical Pharmacist [facility] [phone]

## 2021-02-24 NOTE — Telephone Encounter (Signed)
We can't sign off on hh orders until face to face.

## 2021-02-24 NOTE — Telephone Encounter (Signed)
Cannot sign off until face to face

## 2021-02-24 NOTE — Progress Notes (Signed)
Chronic Care Management Pharmacy Note  02/24/2021 Name:  Christopher [name] MRN:  [id number] DOB:  12-26-1950  Summary: -Pt endorses compliance with medication changes from hospital discharge 8/16 (see below) -Pt reports BG 112-180 since being home from hospital (A1c was 11.5% in hospital) -Home health nurse has checked BP ~138/80  Recommendations/Changes made from today's visit: -Continue current medications -Counseled extensively on hypoglycemia prevention and treatment -Scheduled PCP hospital f/u. Advised pt to call Neurology to schedule f/u as well.   Subjective: Christopher [name] is an 70 y.o. year old male who is a primary patient of Hoyt Koch, MD.  The CCM team was consulted for assistance with disease management and care coordination needs.    Engaged with patient by telephone for follow up visit in response to provider referral for pharmacy case management and/or care coordination services.   Consent to Services:  The patient was given information about Chronic Care Management services, agreed to services, and gave verbal consent prior to initiation of services.  Please see initial visit note for detailed documentation.   Patient Care Team: Hoyt Koch, MD as PCP - General (Internal Medicine) Thompson Grayer, MD as PCP - Electrophysiology (Cardiology) Debara Pickett Nadean Corwin, MD as PCP - Cardiology (Cardiology) [name], MD as Attending Physician (Cardiology) [name], MD (Inactive) as Attending Physician (Cardiothoracic Surgery) Deneise Lever, MD as Attending Physician (Pulmonary Disease) Sherren Mocha, MD as Attending Physician (Cardiology) Juanita Craver, MD as Consulting Physician (Gastroenterology) Thompson Grayer, MD (Cardiology) Charlton Haws, [facility] as Pharmacist (Pharmacist)  Recent office visits: 09/16/20 Dr Sharlet Salina OV: DM f/u. A1c 6.8%. no changes.  Recent consult visits: None  Hospital visits: Medication Reconciliation was  completed by comparing discharge summary, patient's EMR and Pharmacy list, and upon discussion with patient.  Admitted to the hospital on 02/14/21 due to Stroke. Discharge date was 02/18/21. Discharged from [facility]?Medications Started at [facility] Discharge:?? -started Clopidogrel, aspirin x 3 weeks, then clopidogrel alone -started Entresto 24-26 mg BID -started spironolactone 12.5 mg daily -Started Novolog 70/30 22 units BID (A1c 11.5%)  Medications Discontinued at Hospital Discharge: -Stopped amlodipine, ramipril due to starting Entresto  Medications that remain the same after Hospital Discharge:??  -All other medications will remain the same.     Objective:  Lab Results  Component Value Date   CREATININE 1.04 02/18/2021   BUN 13 02/18/2021   GFR 76.13 05/16/2020   GFRNONAA >60 02/18/2021   GFRAA >60 03/31/2020   NA 137 02/18/2021   K 3.4 (L) 02/18/2021   CALCIUM 8.8 (L) 02/18/2021   CO2 22 02/18/2021   GLUCOSE 172 (H) 02/18/2021    Lab Results  Component Value Date/Time   HGBA1C 11.5 (H) 02/15/2021 04:58 AM   HGBA1C 6.8 (A) 09/16/2020 01:42 PM   HGBA1C 6.3 05/16/2020 03:53 PM   FRUCTOSAMINE 246 01/13/2018 10:29 AM   GFR 76.13 05/16/2020 03:53 PM   GFR 72.22 07/18/2019 02:54 PM   MICROALBUR 1.8 07/18/2019 03:00 PM    Last diabetic Eye exam:  Lab Results  Component Value Date/Time   HMDIABEYEEXA No Retinopathy 04/16/2020 12:00 AM    Last diabetic Foot exam: No results found for: HMDIABFOOTEX   Lab Results  Component Value Date   CHOL 125 02/15/2021   HDL 30 (L) 02/15/2021   LDLCALC 34 02/15/2021   LDLDIRECT 52.0 07/18/2019   TRIG 303 (H) 02/15/2021   CHOLHDL 4.2 02/15/2021    Hepatic Function Latest Ref Rng &  Units 05/16/2020 07/18/2019 04/17/2019  Total Protein 6.0 - 8.3 g/dL 6.9 6.9 6.8  Albumin 3.5 - 5.2 g/dL 4.3 4.2 4.4  AST 0 - 37 U/L _0 ALT 0 - 53 U/L _1 Alk Phosphatase 39 - 117 U/L 41 65 86  Total Bilirubin 0.2 -  1.2 mg/dL 0.6 0.6 0.6  Bilirubin, Direct 0.00 - 0.40 mg/dL - - 0.19    Lab Results  Component Value Date/Time   TSH 3.780 01/03/2020 01:18 PM   TSH 1.966 05/05/2017 04:29 PM   TSH 2.75 11/01/2012 12:37 PM    CBC Latest Ref Rng & Units 02/16/2021 02/14/2021 03/31/2020  WBC 4.0 - 10.5 K/uL 6.3 5.7 7.5  Hemoglobin 13.0 - 17.0 g/dL 14.4 14.6 15.5  Hematocrit 39.0 - 52.0 % 41.3 42.1 46.5  Platelets 150 - 400 K/uL 147(L) 168 201    No results found for: VD25OH  Clinical ASCVD: Yes  The ASCVD Risk score Mikey Bussing DC Jr., et al., 2013) failed to calculate for the following reasons:   The valid total cholesterol range is 130 to 320 mg/dL    Depression screen [facility] 2/9 09/05/2020 09/05/2020 05/24/2019  Decreased Interest 0 0 0  Down, Depressed, Hopeless 0 0 0  PHQ - 2 Score 0 0 0  Some recent data might be hidden     Social History   Tobacco Use  Smoking Status Former   Packs/day: 2.50   Years: 5.00   Pack years: 12.50   Types: Cigarettes   Quit date: 07/06/1978   Years since quitting: 42.6  Smokeless Tobacco Never  Tobacco Comments   social drinker   BP Readings from Last 3 Encounters:  02/18/21 (!) 136/99  09/16/20 126/80  05/16/20 138/78   Pulse Readings from Last 3 Encounters:  02/18/21 71  09/16/20 66  05/16/20 72   Wt Readings from Last 3 Encounters:  02/17/21 229 lb 4.5 oz (104 kg)  09/16/20 235 lb 3.2 oz (106.7 kg)  05/16/20 229 lb (103.9 kg)   BMI Readings from Last 3 Encounters:  02/17/21 32.90 kg/m  09/16/20 33.75 kg/m  05/16/20 32.86 kg/m    Assessment/Interventions: Review of patient past medical history, allergies, medications, health status, including review of consultants reports, laboratory and other test data, was performed as part of comprehensive evaluation and provision of chronic care management services.   SDOH:  (Social Determinants of Health) assessments and interventions performed: Yes  SDOH Screenings   Alcohol Screen: Low Risk    Last  Alcohol Screening Score (AUDIT): 0  Depression (PHQ2-9): Low Risk    PHQ-2 Score: 0  Financial Resource Strain: Low Risk    Difficulty of Paying Living Expenses: Not hard at all  Food Insecurity: No Food Insecurity   Worried About Charity fundraiser in the Last Year: Never true   Ran Out of Food in the Last Year: Never true  Housing: Low Risk    Last Housing Risk Score: 0  Physical Activity: Sufficiently Active   Days of Exercise per Week: 5 days   Minutes of Exercise per Session: 30 min  Social Connections: Moderately Integrated   Frequency of Communication with Friends and Family: More than three times a week   Frequency of Social Gatherings with Friends and Family: More than three times a week   Attends Religious Services: More than 4 times per year   Active Member of [employer] or Organizations: No   Attends Music therapist: More than 4  times per year   Marital Status: Divorced  Stress: No Stress Concern Present   Feeling of Stress : Not at all  Tobacco Use: Medium Risk   Smoking Tobacco Use: Former   Smokeless Tobacco Use: Never  Transportation Needs: No Data processing manager (Medical): No   Lack of Transportation (Non-Medical): No    CCM Care Plan  Allergies  Allergen Reactions   Codeine Other (See Comments)    Causes bad constipation   Dust Mite Extract Cough   Pollen Extract Cough    Medications Reviewed Today     Reviewed by Charlton Haws, [facility] (Pharmacist) on 02/24/21 at 1054  Med List Status: <None>   Medication Order Taking? Sig Documenting Provider Last Dose Status Informant  acetaminophen (TYLENOL) 500 MG tablet [id number] Yes Take 500 mg by mouth every 6 (six) hours as needed for mild pain or headache.  [provider] Taking Active Self  aspirin EC 81 MG tablet [id number] Yes Take 1 tablet (81 mg total) by mouth daily for 19 days. Stop aspirin after 9/4 Regalado, Belkys A, MD Taking Active   atorvastatin  (LIPITOR) 40 MG tablet [id number] Yes TAKE 1 TABLET BY MOUTH  DAILY  Patient taking differently: Take 40 mg by mouth daily.   Hoyt Koch, MD Taking Active Self  blood glucose meter kit and supplies KIT [id number] Yes Dispense based on patient and insurance preference. Use up to four times daily as directed. Elmarie Shiley, MD Taking Active            Med Note Luna Glasgow Feb 24, 2021 10:53 AM) Accu Chek  cetirizine (ZYRTEC) 10 MG tablet [id number] Yes Take 10 mg by mouth daily. [provider] Taking Active Self  cholecalciferol (VITAMIN D) 25 MCG (1000 UNIT) tablet [id number] Yes Take 1 tablet by mouth daily.  [provider] Taking Active Self  clopidogrel (PLAVIX) 75 MG tablet [id number] Yes Take 1 tablet (75 mg total) by mouth daily. Regalado, Belkys A, MD Taking Active   fenofibrate (TRICOR) 145 MG tablet [id number] Yes TAKE 1 TABLET BY MOUTH  DAILY  Patient taking differently: Take 145 mg by mouth daily.   Hoyt Koch, MD Taking Active Self  fluticasone Asencion Islam) 50 MCG/ACT nasal spray [id number] Yes USE 2 SPRAYS IN BOTH  NOSTRILS DAILY  Patient taking differently: Place 2 sprays into both nostrils daily.   Hoyt Koch, MD Taking Active Self  insulin aspart protamine - aspart (NOVOLOG 70/30 MIX) (70-30) 100 UNIT/ML FlexPen [id number] Yes Inject 22 Units into the skin 2 (two) times daily. Regalado, Cassie Freer, MD Taking Active   metFORMIN (GLUCOPHAGE) 1000 MG tablet [id number] Yes TAKE 1 TABLET BY MOUTH  TWICE DAILY WITH A MEAL  Patient taking differently: Take 1,000 mg by mouth 2 (two) times daily with a meal.   Hoyt Koch, MD Taking Active Self  metoprolol succinate (TOPROL-XL) 100 MG 24 hr tablet [id number] Yes TAKE 1 TABLET BY MOUTH  DAILY OR IMMEDIATELY  FOLLOWING A MEAL  Patient taking differently: Take 100 mg by mouth every evening.   Hoyt Koch, MD Taking Active   sacubitril-valsartan [facility]) 24-26 [place]  [id number] Yes Take 1 tablet by mouth 2 (two) times daily. Regalado, Belkys A, MD Taking Active   spironolactone (ALDACTONE) 25 MG tablet [id number] Yes Take 0.5 tablets (12.5 mg total) by mouth daily. Regalado, Belkys A, MD Taking Active   tamsulosin (FLOMAX) 0.4 MG CAPS capsule  [id number] Yes TAKE 1 CAPSULE BY MOUTH  DAILY  Patient taking differently: Take 0.4 mg by mouth daily.   Hoyt Koch, MD Taking Active Self            Patient Active Problem List   Diagnosis Date Noted   Diplopia 02/15/2021   GERD without esophagitis 02/15/2021   Uncontrolled type 2 diabetes mellitus with hyperglycemia, without long-term current use of insulin ([place]) 02/15/2021   Vertigo 02/14/2021   Ventricular tachycardia ([place]) 01/03/2020   Nonischemic cardiomyopathy ([place]) 09/27/2017   Heart valve disease 09/27/2017   History of cardiac arrest 09/27/2017   Alcohol use 05/05/2017   Coronary artery disease involving native coronary artery of native heart without angina pectoris 05/05/2017   OSA on CPAP 05/05/2017   History of repair of mitral valve 05/05/2017   Benign prostatic hyperplasia 05/16/2015   Routine general medical examination at a health care facility 81/44/8185   Chronic systolic CHF (congestive heart failure) ([place]) 10/25/2012   AF (paroxysmal atrial fibrillation) ([place]) 10/25/2012   Hyperlipidemia associated with type 2 diabetes mellitus ([place]) 09/04/2012   Severe mitral regurgitation    Coronary artery disease 08/12/2012   Essential hypertension 09/27/2008   SLEEP APNEA 09/27/2008    Immunization History  Administered Date(s) Administered   Fluad Quad(high Dose 65+) 03/04/2019, 05/16/2020   Influenza, High Dose Seasonal PF 05/03/2015, 04/15/2017, 04/22/2018   Influenza,inj,Quad PF,6+ Mos 04/09/2014, 05/11/2016   Influenza-Unspecified 04/26/2013, 05/09/2015   PFIZER(Purple Top)SARS-COV-2 Vaccination 08/18/2019, 09/10/2019, 05/18/2020   Pneumococcal Conjugate-13 11/13/2014    Pneumococcal Polysaccharide-23 05/11/2016   Pneumococcal-Unspecified 02/24/2010   Tdap 08/20/2010   Zoster Recombinat (Shingrix) 04/26/2017, 07/01/2017   Zoster, Live 08/06/2009    Conditions to be addressed/monitored:  Hypertension, Hyperlipidemia, Diabetes, Atrial Fibrillation, Heart Failure, and Coronary Artery Disease  Care Plan : [place]  Updates made by Charlton Haws, [place] since 02/24/2021 12:00 AM     Problem: Hypertension, Hyperlipidemia, Diabetes, Atrial Fibrillation, Heart Failure, and Coronary Artery Disease   Priority: High     Long-Range Goal: Disease management   Start Date: 02/24/2021  Expected End Date: 02/24/2022  This Visit's Progress: On track  Priority: High  Note:   Current Barriers:  Unable to independently monitor therapeutic efficacy Suboptimal therapeutic regimen for diabetes  Pharmacist Clinical Goal(s):  Patient will achieve adherence to monitoring guidelines and medication adherence to achieve therapeutic efficacy adhere to plan to optimize therapeutic regimen for diabetes as evidenced by report of adherence to recommended medication management changes through collaboration with PharmD and provider.   Interventions: 1:1 collaboration with Hoyt Koch, MD regarding development and update of comprehensive plan of care as evidenced by provider attestation and co-signature Inter-disciplinary care team collaboration (see longitudinal plan of care) Comprehensive medication review performed; medication list updated in electronic medical record    Heart Failure / Hypertension    HF Type: Diastolic (grade II dysfunction), mild LVH Last ejection fraction: 55-60% (01/26/2020) NYHA Class: I (no actitivty limitation)   BP goal is:  <130/80 Patient checks BP at home infrequently Patient home BP readings are ranging: 138/80   Patient has failed these meds in past: ramipril, amlodipine Patient is currently controlled on the  following medications:  Metoprolol succinate 100 mg daily PM Entresto 24-26 mg BID Spironolactone 25 mg - 1/2 tab daily   We discussed: medication changes from hospital 8/16 - pt endorses compliance with changes (stopped amlodipine, ramipril and started Entresto, spironolactone as prescribed); he is not checking his own BP  but [facility] nurse is coming a few times a week and checking    Plan: Continue current medications and control with diet and exercise   AFIB    Patient is currently rate controlled Patient has failed these meds in past: Eliquis, Xarelto, warfarin Patient is currently controlled on the following medications:  Metoprolol succinate 100 mg daily AM Aspirin 81 mg daily AM   We discussed: Pt is not on anticoagulation d/t hx of thigh hematoma while on Xarelto   Plan: Continue current medications   Hyperlipidemia / CAD    LDL goal < 70 CAD, NICM; stroke 02/2021  Patient has failed these meds in past: n/a Patient is currently controlled on the following medications:  Atorvastatin 40 mg daily PM Fenofibrate 145 mg daily AM Aspirin 81 mg daily Clopidogrel 75 mg daily   We discussed:  pt reports compliance with new clopidogrel; he voiced understanding of plan to stop aspirin after 3 weeks (Sept 4th); counseled on indication for clopidogrel and risk for bleeding   Plan: Continue current medications   Diabetes    A1c goal <7% Fasting BG goal 80-130 Post-prandial BG goal < 180  Recent home BG: AM - 131, 156, 112; before dinner - 180   Patient has failed these meds in past: Jardiance 25 mg (cost $400) Patient is currently controlled on the following medications: Metformin 1000 mg BID Novolog 70/30 22 units BID Testing supplies (Accu-Chek)   We discussed: A1c increased drastically between March and August (6.8 to 11.5%), pt cannot point to significant change in lifestyle; he reports he has started insulin as directed and is checking BG with new glucometer; he is  struggling with lancets, unable to get enough blood sometimes; counseled on lancing technique and holding test strip to finger to siphon blood -counseled on A1c and blood sugar goals; how A1c is related to average BG; -counseled on benefits/risks of insulin use; discussed hypoglycemia at length; counseled on hypoglycemia prevention and treatment -may consider SGLT-2 or GLP-1 - pt assistance available. For now continue current therapy as BG stabilizes   Plan: Continue current medications  Patient Goals/Self-Care Activities Patient will:  - take medications as prescribed focus on medication adherence by routine check glucose daily, document, and provide at future appointments check blood pressure 3x a week, document, and provide at future appointments      Medication Assistance: None required.  Patient affirms current coverage meets needs.  Compliance/Adherence/Medication fill history: Care Gaps: Foot exam (due 01/15/21) Vaccines - covid booster, TDAP  Star-Rating Drugs: Atorvastatin - LF 01/10/21 x 90 ds Entresto - LF 02/18/21 x 30 ds Metformin - LF 01/14/21 x 90 ds  Patient's preferred pharmacy is:  [facility]  ([place] - [place], [address] [place] [address] Phone: [phone] Fax: [place], [address] [place] [address] Phone: [phone] Fax: [phone]  Uses pill box? Yes Pt endorses 100% compliance  We discussed: Current pharmacy is preferred with insurance plan and patient is satisfied with pharmacy services Patient decided to: Continue current medication management strategy  Care Plan and Follow Up Patient Decision:  Patient agrees to Care Plan and Follow-up.  Plan: Telephone follow up appointment with care management team member scheduled for:  1 month  Charlene Brooke, PharmD, [place],  CPP Clinical Pharmacist [facility] [phone]

## 2021-02-25 NOTE — Telephone Encounter (Signed)
Noted  

## 2021-02-26 ENCOUNTER — Telehealth: Payer: Self-pay | Admitting: Internal Medicine

## 2021-02-26 DIAGNOSIS — R42 Dizziness and giddiness: Secondary | ICD-10-CM | POA: Diagnosis not present

## 2021-02-26 DIAGNOSIS — E1122 Type 2 diabetes mellitus with diabetic chronic kidney disease: Secondary | ICD-10-CM | POA: Diagnosis not present

## 2021-02-26 DIAGNOSIS — I69398 Other sequelae of cerebral infarction: Secondary | ICD-10-CM | POA: Diagnosis not present

## 2021-02-26 DIAGNOSIS — H538 Other visual disturbances: Secondary | ICD-10-CM | POA: Diagnosis not present

## 2021-02-26 DIAGNOSIS — I1310 Hypertensive heart and chronic kidney disease without heart failure, with stage 1 through stage 4 chronic kidney disease, or unspecified chronic kidney disease: Secondary | ICD-10-CM | POA: Diagnosis not present

## 2021-02-26 NOTE — Telephone Encounter (Signed)
Christopher [name] [phone] (has secure voicemail)  Needs Order for a social worker  Patient needs transportation assistance, glad to take a verbal

## 2021-02-27 ENCOUNTER — Encounter: Payer: Self-pay | Admitting: Internal Medicine

## 2021-02-27 ENCOUNTER — Other Ambulatory Visit: Payer: Self-pay

## 2021-02-27 ENCOUNTER — Ambulatory Visit (INDEPENDENT_AMBULATORY_CARE_PROVIDER_SITE_OTHER): Payer: Medicare Other | Admitting: Internal Medicine

## 2021-02-27 VITALS — BP 130/80 | HR 74 | Temp 98.3°F | Resp 18 | Ht 70.0 in | Wt 230.2 lb

## 2021-02-27 DIAGNOSIS — E1122 Type 2 diabetes mellitus with diabetic chronic kidney disease: Secondary | ICD-10-CM | POA: Diagnosis not present

## 2021-02-27 DIAGNOSIS — H532 Diplopia: Secondary | ICD-10-CM | POA: Diagnosis not present

## 2021-02-27 DIAGNOSIS — I5022 Chronic systolic (congestive) heart failure: Secondary | ICD-10-CM

## 2021-02-27 DIAGNOSIS — R42 Dizziness and giddiness: Secondary | ICD-10-CM

## 2021-02-27 DIAGNOSIS — I1310 Hypertensive heart and chronic kidney disease without heart failure, with stage 1 through stage 4 chronic kidney disease, or unspecified chronic kidney disease: Secondary | ICD-10-CM | POA: Diagnosis not present

## 2021-02-27 DIAGNOSIS — E118 Type 2 diabetes mellitus with unspecified complications: Secondary | ICD-10-CM

## 2021-02-27 DIAGNOSIS — I69398 Other sequelae of cerebral infarction: Secondary | ICD-10-CM | POA: Diagnosis not present

## 2021-02-27 DIAGNOSIS — H538 Other visual disturbances: Secondary | ICD-10-CM | POA: Diagnosis not present

## 2021-02-27 LAB — COMPREHENSIVE METABOLIC PANEL
ALT: 22 U/L (ref 0–53)
AST: 21 U/L (ref 0–37)
Albumin: 4.2 g/dL (ref 3.5–5.2)
Alkaline Phosphatase: 44 U/L (ref 39–117)
BUN: 20 mg/dL (ref 6–23)
CO2: 29 mEq/L (ref 19–32)
Calcium: 9.6 mg/dL (ref 8.4–10.5)
Chloride: 102 mEq/L (ref 96–112)
Creatinine, Ser: 1.27 mg/dL (ref 0.40–1.50)
GFR: 56.84 mL/min — ABNORMAL LOW (ref >60.00)
Glucose, Bld: 139 mg/dL — ABNORMAL HIGH (ref 70–99)
Potassium: 3.6 mEq/L (ref 3.5–5.1)
Sodium: 140 mEq/L (ref 135–145)
Total Bilirubin: 0.8 mg/dL (ref 0.2–1.2)
Total Protein: 6.9 g/dL (ref 6.0–8.3)

## 2021-02-27 LAB — CBC
HCT: 43.1 % (ref 39.0–52.0)
Hemoglobin: 14.5 g/dL (ref 13.0–17.0)
MCHC: 33.7 g/dL (ref 30.0–36.0)
MCV: 88 fl (ref 78.0–100.0)
Platelets: 159 10*3/uL (ref 150.0–400.0)
RBC: 4.9 Mil/uL (ref 4.22–5.81)
RDW: 13.8 % (ref 11.5–15.5)
WBC: 6.1 10*3/uL (ref 4.0–10.5)

## 2021-02-27 MED ORDER — CLOPIDOGREL BISULFATE 75 MG PO TABS: 75.0000 mg | ORAL_TABLET | Freq: Every day | ORAL | 3 refills | Status: DC

## 2021-02-27 MED ORDER — INSULIN LISPRO PROT & LISPRO (75-25 MIX) 100 UNIT/ML KWIKPEN: 22.0000 [IU] | PEN_INJECTOR | Freq: Two times a day (BID) | SUBCUTANEOUS | 11 refills | Status: DC

## 2021-02-27 MED ORDER — SACUBITRIL-VALSARTAN 24-26 MG PO TABS: 1.0000 | ORAL_TABLET | Freq: Two times a day (BID) | ORAL | 3 refills | Status: DC

## 2021-02-27 NOTE — Patient Instructions (Addendum)
We have sent in the refills.  Call us back with how the sugars are doing in 3-4 weeks.   We will see you back in the office in [place]

## 2021-02-27 NOTE — Progress Notes (Signed)
   Subjective:   Patient ID: Christopher [name], male    DOB: 24-Jun-1951, 70 y.o.   MRN: [id number]  HPI The patient is a 70 YO man coming in for hospital follow up and was in for stroke. With double vision persistent and balance problems. The balance is gradually improving. Working with PT at home. Denies appetite or sleep problems. Denies chest pains or breathing problems. Some medication changes in the hospital and he needs refills on those. Is taking insulin and monitoring morning sugars mostly around 130.   PMH, [facility], social history reviewed and updated  Review of Systems  Constitutional: Negative.   HENT: Negative.    Eyes:  Positive for visual disturbance.  Respiratory:  Negative for cough, chest tightness and shortness of breath.   Cardiovascular:  Negative for chest pain, palpitations and leg swelling.  Gastrointestinal:  Negative for abdominal distention, abdominal pain, constipation, diarrhea, nausea and vomiting.  Musculoskeletal: Negative.   Skin: Negative.   Neurological:  Positive for dizziness.  Psychiatric/Behavioral: Negative.     Objective:  Physical Exam Constitutional:      Appearance: He is well-developed.  HENT:     Head: Normocephalic and atraumatic.  Cardiovascular:     Rate and Rhythm: Normal rate and regular rhythm.  Pulmonary:     Effort: Pulmonary effort is normal. No respiratory distress.     Breath sounds: Normal breath sounds. No wheezing or rales.  Abdominal:     General: Bowel sounds are normal. There is no distension.     Palpations: Abdomen is soft.     Tenderness: There is no abdominal tenderness. There is no rebound.  Musculoskeletal:     Cervical back: Normal range of motion.  Skin:    General: Skin is warm and dry.  Neurological:     Mental Status: He is alert and oriented to person, place, and time.     Coordination: Coordination normal.     Comments: Double vision, some balance problems    Vitals:   02/27/21 0840  BP: 130/80  Pulse:  74  Resp: 18  Temp: 98.3 F (36.8 C)  TempSrc: Oral  SpO2: 98%  Weight: 230 lb 3.2 oz (104.4 kg)  Height: '5\' 10"'$  (1.778 m)    This visit occurred during the SARS-CoV-2 public health emergency.  Safety protocols were in place, including screening questions prior to the visit, additional usage of staff PPE, and extensive cleaning of exam room while observing appropriate contact time as indicated for disinfecting solutions.   Assessment & Plan:

## 2021-02-28 NOTE — Telephone Encounter (Signed)
Okay for verbal 

## 2021-02-28 NOTE — Telephone Encounter (Signed)
Verbal orders given to [facility]. No other questions or concerns at this time.

## 2021-02-28 NOTE — Assessment & Plan Note (Signed)
Taking aspirin and plavix until early September and then will continue on plavix alone. Refilled plavix. No sign of progression or new stroke.

## 2021-02-28 NOTE — Assessment & Plan Note (Addendum)
Started on entresto in the hospital due to decrease in EF. This is refilled but they are unsure the cost. Advised to address with upcoming cardiology visit. Checking CBC and CMP due to medication changes.

## 2021-02-28 NOTE — Telephone Encounter (Signed)
See below

## 2021-02-28 NOTE — Assessment & Plan Note (Signed)
Due to stroke and we discussed timeline in which he could still expect improvement.

## 2021-02-28 NOTE — Assessment & Plan Note (Signed)
Surprising poor control in recent admission with HgA1c>10 and started on insulin which is refilled with a type with better coverage 75/25 keep 22 units BID. Continue metformin 1000 mg BID. Recheck HgA1c in 3 months. Morning sugars are appropriate.

## 2021-03-04 DIAGNOSIS — I1310 Hypertensive heart and chronic kidney disease without heart failure, with stage 1 through stage 4 chronic kidney disease, or unspecified chronic kidney disease: Secondary | ICD-10-CM | POA: Diagnosis not present

## 2021-03-04 DIAGNOSIS — R42 Dizziness and giddiness: Secondary | ICD-10-CM | POA: Diagnosis not present

## 2021-03-04 DIAGNOSIS — E1122 Type 2 diabetes mellitus with diabetic chronic kidney disease: Secondary | ICD-10-CM | POA: Diagnosis not present

## 2021-03-04 DIAGNOSIS — H538 Other visual disturbances: Secondary | ICD-10-CM | POA: Diagnosis not present

## 2021-03-04 DIAGNOSIS — I69398 Other sequelae of cerebral infarction: Secondary | ICD-10-CM | POA: Diagnosis not present

## 2021-03-05 DIAGNOSIS — I1310 Hypertensive heart and chronic kidney disease without heart failure, with stage 1 through stage 4 chronic kidney disease, or unspecified chronic kidney disease: Secondary | ICD-10-CM | POA: Diagnosis not present

## 2021-03-05 DIAGNOSIS — E1122 Type 2 diabetes mellitus with diabetic chronic kidney disease: Secondary | ICD-10-CM | POA: Diagnosis not present

## 2021-03-05 DIAGNOSIS — R42 Dizziness and giddiness: Secondary | ICD-10-CM | POA: Diagnosis not present

## 2021-03-05 DIAGNOSIS — I69398 Other sequelae of cerebral infarction: Secondary | ICD-10-CM | POA: Diagnosis not present

## 2021-03-05 DIAGNOSIS — H538 Other visual disturbances: Secondary | ICD-10-CM | POA: Diagnosis not present

## 2021-03-07 NOTE — Progress Notes (Signed)
Remote ICD transmission.   

## 2021-03-11 DIAGNOSIS — I69398 Other sequelae of cerebral infarction: Secondary | ICD-10-CM | POA: Diagnosis not present

## 2021-03-11 DIAGNOSIS — H538 Other visual disturbances: Secondary | ICD-10-CM | POA: Diagnosis not present

## 2021-03-11 DIAGNOSIS — I1310 Hypertensive heart and chronic kidney disease without heart failure, with stage 1 through stage 4 chronic kidney disease, or unspecified chronic kidney disease: Secondary | ICD-10-CM | POA: Diagnosis not present

## 2021-03-11 DIAGNOSIS — R42 Dizziness and giddiness: Secondary | ICD-10-CM | POA: Diagnosis not present

## 2021-03-11 DIAGNOSIS — E1122 Type 2 diabetes mellitus with diabetic chronic kidney disease: Secondary | ICD-10-CM | POA: Diagnosis not present

## 2021-03-12 DIAGNOSIS — H538 Other visual disturbances: Secondary | ICD-10-CM | POA: Diagnosis not present

## 2021-03-12 DIAGNOSIS — I69398 Other sequelae of cerebral infarction: Secondary | ICD-10-CM | POA: Diagnosis not present

## 2021-03-12 DIAGNOSIS — R42 Dizziness and giddiness: Secondary | ICD-10-CM | POA: Diagnosis not present

## 2021-03-12 DIAGNOSIS — E1122 Type 2 diabetes mellitus with diabetic chronic kidney disease: Secondary | ICD-10-CM | POA: Diagnosis not present

## 2021-03-12 DIAGNOSIS — I1310 Hypertensive heart and chronic kidney disease without heart failure, with stage 1 through stage 4 chronic kidney disease, or unspecified chronic kidney disease: Secondary | ICD-10-CM | POA: Diagnosis not present

## 2021-03-13 ENCOUNTER — Other Ambulatory Visit: Payer: Self-pay

## 2021-03-13 ENCOUNTER — Ambulatory Visit: Payer: Medicare Other | Admitting: Physician Assistant

## 2021-03-13 ENCOUNTER — Encounter: Payer: Self-pay | Admitting: Physician Assistant

## 2021-03-13 VITALS — BP 120/76 | HR 78 | Ht 70.0 in | Wt 234.4 lb

## 2021-03-13 DIAGNOSIS — I10 Essential (primary) hypertension: Secondary | ICD-10-CM | POA: Diagnosis not present

## 2021-03-13 DIAGNOSIS — I480 Paroxysmal atrial fibrillation: Secondary | ICD-10-CM

## 2021-03-13 DIAGNOSIS — Z79899 Other long term (current) drug therapy: Secondary | ICD-10-CM

## 2021-03-13 DIAGNOSIS — Z95810 Presence of automatic (implantable) cardiac defibrillator: Secondary | ICD-10-CM

## 2021-03-13 DIAGNOSIS — E1122 Type 2 diabetes mellitus with diabetic chronic kidney disease: Secondary | ICD-10-CM | POA: Diagnosis not present

## 2021-03-13 DIAGNOSIS — Z9889 Other specified postprocedural states: Secondary | ICD-10-CM

## 2021-03-13 DIAGNOSIS — I5022 Chronic systolic (congestive) heart failure: Secondary | ICD-10-CM | POA: Diagnosis not present

## 2021-03-13 DIAGNOSIS — Z8673 Personal history of transient ischemic attack (TIA), and cerebral infarction without residual deficits: Secondary | ICD-10-CM | POA: Diagnosis not present

## 2021-03-13 DIAGNOSIS — E785 Hyperlipidemia, unspecified: Secondary | ICD-10-CM | POA: Diagnosis not present

## 2021-03-13 DIAGNOSIS — I2510 Atherosclerotic heart disease of native coronary artery without angina pectoris: Secondary | ICD-10-CM

## 2021-03-13 DIAGNOSIS — I69398 Other sequelae of cerebral infarction: Secondary | ICD-10-CM | POA: Diagnosis not present

## 2021-03-13 DIAGNOSIS — R42 Dizziness and giddiness: Secondary | ICD-10-CM | POA: Diagnosis not present

## 2021-03-13 DIAGNOSIS — H538 Other visual disturbances: Secondary | ICD-10-CM | POA: Diagnosis not present

## 2021-03-13 DIAGNOSIS — I1310 Hypertensive heart and chronic kidney disease without heart failure, with stage 1 through stage 4 chronic kidney disease, or unspecified chronic kidney disease: Secondary | ICD-10-CM | POA: Diagnosis not present

## 2021-03-13 MED ORDER — DAPAGLIFLOZIN PROPANEDIOL 10 MG PO TABS: 10.0000 mg | ORAL_TABLET | Freq: Every day | ORAL | 11 refills | Status: DC

## 2021-03-13 MED ORDER — ENTRESTO 49-51 MG PO TABS: 1.0000 | ORAL_TABLET | Freq: Two times a day (BID) | ORAL | 11 refills | Status: DC

## 2021-03-13 NOTE — Patient Instructions (Signed)
Medication Instructions:  INCREASE FARXIGA '10MG'$  DAILY  INCREASE ENTRESTO 49/'51MG'$  DAILY  *If you need a refill on your cardiac medications before your next appointment, please call your pharmacy*  Lab Work: BMET BEFORE FOLLOW UP APPOINTMENT-THIS IS NOT FASTING If you have labs (blood work) drawn today and your tests are completely normal, you will receive your results only by:  [place] (if you have MyChart) OR A paper copy in the mail.  If you have any lab test that is abnormal or we need to change your treatment, we will call you to review the results. You may go to any [facility] that is convenient for you however, we do have a lab in our office that is able to assist you. You DO NOT need an appointment for our lab. The lab is open 8:00am and closes at 4:00pm. Lunch 12:45 - 1:45pm.  Testing/Procedures: NONE  Follow-Up: Your next appointment:  3-4 week(s) In Person with  HAO MENG, PA-C  AND 3 MONTHS WITH DR HILTY   At [facility], you and your health needs are our priority.  As part of our continuing mission to provide you with exceptional heart care, we have created designated Provider Care Teams.  These Care Teams include your primary Cardiologist (physician) and Advanced Practice Providers (APPs -  Physician Assistants and Nurse Practitioners) who all work together to provide you with the care you need, when you need it.

## 2021-03-13 NOTE — Progress Notes (Signed)
Cardiology Office Note:    Date:  03/15/2021   ID:  Christopher [name], DOB 08/02/1950, MRN [id number]  PCP:  Christopher Koch, MD   [facility] Providers Cardiologist:  Pixie Casino, MD Electrophysiologist:  Thompson Grayer, MD     Referring MD: Christopher [name], *   Chief Complaint  Patient presents with   Follow-up    Seen for Dr. Debara Pickett    History of Present Illness:    Christopher [name] is a 70 y.o. male with a hx of CAD s/p PCI to RCA 2014, SBE, severe MR s/p repair 2014, cardiac arrest s/p Medtronic single chamber ICD 2018, NICM, postop afib, HTN, HLD, and OSA.  Patient was previously followed by Dr. Aundra Dubin in the office.  He was admitted to [facility] on 05/05/2017 after a syncopal event while at [employer].  He was successfully defibrillated with ROSC and underwent ICD implantation by Dr. Rayann Heman.  Left heart cath during that admission showed nonobstructive CAD with moderately reduced LVEF.  Echocardiogram obtained on 05/07/2017 showed EF 40 to 45%.  After the event, patient was followed by Dr. Saunders Revel. By April 2019, ejection fraction has improved to 50 to 55% on echo.  Last echocardiogram obtained on 01/26/2020 showed EF 55 to 60%, grade 2 DD, elevated LVEDP, mild mitral stenosis, trivial MR, mild AI.  Patient was most recently seen in the hospital in August 2022 for diplopia.  Work-up with MRI revealed acute pontine stroke.  He was placed on aspirin and Plavix with plan to transition to Plavix monotherapy after 3 weeks.  EKG showed sinus rhythm with bifascicular block and PVCs.  Echocardiogram was performed which showed newly reduced EF of 30 to 35% with anterior apical and inferior apical wall motion abnormality suggestive of Takotsubo versus LAD territory wall motion abnormality.  Given the concern of acute stroke, no ischemic work-up was performed.  Patient was started on Entresto after ACE inhibitor washout.  Spironolactone was added.  Since discharge, repeat blood work  obtained on 02/27/2021 showed stable renal function and electrolyte.  He has since came off of aspirin and started Plavix monotherapy.  Patient presents today for post hospital follow-up.  Initially after discharge, he did have a little bit dizziness, however has since been doing much better.  His blood pressure on manual recheck was 134/70.  I will increase Entresto to 49-51 mg twice a day.  He says he never received a 30-day free coupon card on Entresto from the hospital, we will give him a 30-day free coupon with a higher dose of Entresto.  Otherwise he is getting his medication through [facility].  We will also add Farxiga 10 mg daily.  With the increased dose of Entresto, he will need a basic metabolic panel in 2 to 3 weeks.  I plan to bring the patient back in 3 to 4 weeks for the final heart failure medication titration.  I will arrange for the patient to see Dr. Debara Pickett in December.  Once I completed the heart failure medication titration during the next visit, I will arrange a echocardiogram either in late November or early December to reassess ejection fraction.  Otherwise patient denies any recent chest discomfort prior to or since the hospitalization.    Past Medical History:  Diagnosis Date   Allergy    Arthritis    Atrial fibrillation ([place])    post op, intol of anticoag   Cardiac arrest ([place])    a. s/p MDT single  chamber ICD; 11-10-18- pt denies having a heart attack   Chronic kidney disease    kidney stone   Coronary artery disease    a. 2/7 Cath: LM nl, LAD min irregs, LCX min irregs, RI 40, RCA 52m, EF 55-60% basal to mid inf HK, 3-4+ MR;  b. 08/25/2012 PCI of RCA with 4.0x15 Vision BMS   Diabetes mellitus without complication (HCC)    GERD (gastroesophageal reflux disease)    hx   Heart murmur    Hyperlipidemia    on statin   Hypertension    S/P mitral valve repair 09/27/2012   Complex valvuloplasty including triangular resection of flail posterior leaflet with 30 mm Sorin Memo  3D ring annuloplasty via right mini thoracotomy approach   Severe mitral regurgitation    a. Mitral valve prolapse with flail segment of posterior leaflet and severe MR by TEE, remote h/o bacterial endocarditis    Sleep apnea    NPSG 01/21/06- AHI 40.7/hr cpap   Subacute bacterial endocarditis 03/22/2008   Strep viridans   Ventricular fibrillation ([place]) 11/07/2019   appropriate shock (36J) for VF delivered    Past Surgical History:  Procedure Laterality Date   AMPUTATION  07/28/2012   Procedure: AMPUTATION DIGIT;  Surgeon: Colin Rhein, MD;  Location: [facility];  Service: Orthopedics;  Laterality: Right;  2nd toe   CARDIAC CATHETERIZATION     COLONOSCOPY     EP IMPLANTABLE DEVICE N/A 05/01/2016   Procedure: Loop Recorder Removal;  Surgeon: Thompson Grayer, MD;  Location: [place] CV LAB;  Service: Cardiovascular;  Laterality: N/A;   FOOT SURGERY     BILATERAL TOES   ICD IMPLANT N/A 05/07/2017    Medtronic Visia AF MRI VR SureScan implanted by Dr Curt Bears following VF arrest   Implantable loop recorder placement  03/31/13   MDT Linq implanted by Dr Rayann Heman to evaluate for further afib   INTRAOPERATIVE TRANSESOPHAGEAL ECHOCARDIOGRAM N/A 09/27/2012   Procedure: INTRAOPERATIVE TRANSESOPHAGEAL ECHOCARDIOGRAM;  Surgeon: [name], MD;  Location: [place];  Service: Open Heart Surgery;  Laterality: N/A;   LEFT AND RIGHT HEART CATHETERIZATION WITH CORONARY ANGIOGRAM N/A 08/12/2012   Procedure: LEFT AND RIGHT HEART CATHETERIZATION WITH CORONARY ANGIOGRAM;  Surgeon: [name], MD;  Location: [facility] CATH LAB;  Service: Cardiovascular;  Laterality: N/A;   LEFT HEART CATH AND CORONARY ANGIOGRAPHY N/A 05/06/2017   Procedure: LEFT HEART CATH AND CORONARY ANGIOGRAPHY;  Surgeon: [name], MD;  Location: [place] CV LAB;  Service: Cardiovascular;  Laterality: N/A;   LOOP RECORDER IMPLANT N/A 03/31/2013   Procedure: LOOP RECORDER IMPLANT;  Surgeon: [name], MD;  Location: [place] CATH LAB;   Service: Cardiovascular;  Laterality: N/A;   MITRAL VALVE REPAIR Right 09/27/2012   Procedure: MINIMALLY INVASIVE MITRAL VALVE REPAIR (MVR);  Surgeon: [name], MD;  Location: [place];  Service: Open Heart Surgery;  Laterality: Right;  Ultrasound guided   PERCUTANEOUS CORONARY STENT INTERVENTION (PCI-S) N/A 08/25/2012   Procedure: PERCUTANEOUS CORONARY STENT INTERVENTION (PCI-S);  Surgeon: Sherren Mocha, MD;  Location: [facility] CATH LAB;  Service: Cardiovascular;  Laterality: N/A;   SEPTOPLASTY     Dr. [name]   TEE WITHOUT CARDIOVERSION N/A 08/12/2012   Procedure: TRANSESOPHAGEAL ECHOCARDIOGRAM (TEE);  Surgeon: [name], MD;  Location: [facility] ENDOSCOPY;  Service: Cardiovascular;  Laterality: N/A;   TONSILLECTOMY     UVULOPALATOPHARYNGOPLASTY      Current Medications: Current Meds  Medication Sig   acetaminophen (TYLENOL) 500 MG tablet Take  500 mg by mouth every 6 (six) hours as needed for mild pain or headache.    atorvastatin (LIPITOR) 40 MG tablet TAKE 1 TABLET BY MOUTH  DAILY   blood glucose meter kit and supplies KIT Dispense based on patient and insurance preference. Use up to four times daily as directed.   cetirizine (ZYRTEC) 10 MG tablet Take 10 mg by mouth daily.   cholecalciferol (VITAMIN D) 25 MCG (1000 UNIT) tablet Take 1 tablet by mouth daily.    clopidogrel (PLAVIX) 75 MG tablet Take 1 tablet (75 mg total) by mouth daily.   sacubitril-valsartan (ENTRESTO) 49-51 MG Take 1 tablet by mouth 2 (two) times daily.   [DISCONTINUED] dapagliflozin propanediol (FARXIGA) 10 MG TABS tablet Take 1 tablet (10 mg total) by mouth daily before breakfast.     Allergies:   Codeine, Dust mite extract, and Pollen extract   Social History   Socioeconomic History   Marital status: Divorced    Spouse name: Not on file   Number of children: Not on file   Years of education: Not on file   Highest education level: Not on file  Occupational History   Occupation: Works at [employer] Retired   Tobacco Use   Smoking status: Former    Packs/day: 2.50    Years: 5.00    Pack years: 12.50    Types: Cigarettes    Quit date: 07/06/1978    Years since quitting: 42.7   Smokeless tobacco: Never   Tobacco comments:    social drinker  Vaping Use   Vaping Use: Never used  Substance and Sexual Activity   Alcohol use: Yes    Comment: OCC.   Drug use: No   Sexual activity: Yes  Other Topics Concern   Not on file  Social History Narrative   Works at [employer] - plans to retire 07/2015 after 36 years.   Divorced, lives alone   Supportive g-friend (shirlee moore)         Social Determinants of Health   Financial Resource Strain: Low Risk    Difficulty of Paying Living Expenses: Not hard at all  Food Insecurity: No Food Insecurity   Worried About Charity fundraiser in the Last Year: Never true   Arboriculturist in the Last Year: Never true  Transportation Needs: No Transportation Needs   Lack of Transportation (Medical): No   Lack of Transportation (Non-Medical): No  Physical Activity: Sufficiently Active   Days of Exercise per Week: 5 days   Minutes of Exercise per Session: 30 min  Stress: No Stress Concern Present   Feeling of Stress : Not at all  Social Connections: Moderately Integrated   Frequency of Communication with Friends and Family: More than three times a week   Frequency of Social Gatherings with Friends and Family: More than three times a week   Attends Religious Services: More than 4 times per year   Active Member of [employer] or Organizations: No   Attends Music therapist: More than 4 times per year   Marital Status: Divorced     Family History: The patient's family history includes Colon cancer (age of onset: 47) in his father; Diabetes in his father and mother; Heart attack in his brother; Heart disease in his father; Renal cancer in his father; Stroke in his mother; Sudden death in his brother. There is no history of Esophageal cancer, Rectal  cancer, or Stomach cancer.  ROS:   Please see the history of  present illness.     All other systems reviewed and are negative.  EKGs/Labs/Other Studies Reviewed:    The following studies were reviewed today:  Echo 02/15/2021 1. 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 The EF drop is  new. The wall motion abnormalities are new. Findings could represent  stress induced cardiomyopathy vs LAD infarction, clinical correlation is  recommended. Left ventricular  ejection fraction, by estimation, is 30 to 35%. The left ventricle has  moderately decreased function. The left ventricle demonstrates regional  wall motion abnormalities (see scoring diagram/findings for description).  The left ventricular internal cavity  size was severely dilated. Left ventricular diastolic function could not  be evaluated.   2. Right ventricular systolic function is normal. The right ventricular  size is normal. Tricuspid regurgitation signal is inadequate for assessing  PA pressure.   3. The mitral valve has been repaired/replaced. No evidence of mitral  valve regurgitation. No evidence of mitral stenosis. There is a 30 mm  prosthetic annuloplasty ring present in the mitral position. Procedure  Date: 09/27/2012. Echo findings are  consistent with normal structure and function of the mitral valve  prosthesis.   4. The aortic valve is tricuspid. Aortic valve regurgitation is trivial.  No aortic stenosis is present.   Comparison(s): Changes from prior study are noted. EF is now reduced. New  WMA described above.   EKG:  EKG is not ordered today.    Recent Labs: 02/27/2021: ALT 22; BUN 20; Creatinine, Ser 1.27; Hemoglobin 14.5; Platelets 159.0; Potassium 3.6; Sodium 140  Recent Lipid Panel    Component Value Date/Time   CHOL 125 02/15/2021 0458   CHOL 124 04/17/2019 0753   TRIG 303 (H) 02/15/2021 0458   HDL 30 (L) 02/15/2021 0458   HDL 33 (L) 04/17/2019 0753   CHOLHDL 4.2 02/15/2021 0458    VLDL 61 (H) 02/15/2021 0458   LDLCALC 34 02/15/2021 0458   LDLCALC 52 04/17/2019 0753   LDLDIRECT 52.0 07/18/2019 1454     Risk Assessment/Calculations:    CHA2DS2-VASc Score = 5   This indicates a 7.2% annual risk of stroke. The patient's score is based upon: CHF History: 1 HTN History: 1 Diabetes History: 1 Stroke History: 0 Vascular Disease History: 1 Age Score: 1 Gender Score: 0          Physical Exam:    VS:  BP 120/76   Pulse 78   Ht $R'5\' 10"'GQ$  (1.778 m)   Wt 234 lb 6.4 oz (106.3 kg)   SpO2 96%   BMI 33.63 kg/m     Wt Readings from Last 3 Encounters:  03/13/21 234 lb 6.4 oz (106.3 kg)  02/27/21 230 lb 3.2 oz (104.4 kg)  02/17/21 229 lb 4.5 oz (104 kg)     GEN:  Well nourished, well developed in no acute distress HEENT: Normal NECK: No JVD; No carotid bruits LYMPHATICS: No lymphadenopathy CARDIAC: RRR, no murmurs, rubs, gallops RESPIRATORY:  Clear to auscultation without rales, wheezing or rhonchi  ABDOMEN: Soft, non-tender, non-distended MUSCULOSKELETAL:  No edema; No deformity  SKIN: Warm and dry NEUROLOGIC:  Alert and oriented x 3 PSYCHIATRIC:  Normal affect   ASSESSMENT:    1. Coronary artery disease involving native coronary artery of native heart without angina pectoris   2. Chronic systolic CHF (congestive heart failure) ([place])   3. Medication management   4. ICD (implantable cardioverter-defibrillator) in place   5. Paroxysmal atrial fibrillation (HCC)   6. Essential hypertension   7.  Hyperlipidemia LDL goal <70   8. History of repair of mitral valve   9. H/O: CVA (cerebrovascular accident)    PLAN:    In order of problems listed above:  CAD: Currently on Plavix monotherapy.  Denies any recent chest discomfort  Chronic systolic heart failure: Patient carries a prior diagnosis of nonischemic cardiomyopathy.  Ejection fraction was later normalized, however recent echocardiogram shows his EF has dropped down to the 35% again.  He this  occurred in the setting of acute CVA.  We will continue titrate heart failure therapy.  Increase Entresto to 49-51 mg twice a day.  Add Farxiga 10 mg daily.  I plan to bring the patient back in 1 month for final medication titration.  He will need repeat echocardiogram in late November or early December.  History of cardiac arrest s/p Medtronic single-chamber ICD 2018: Followed by EP service.  History of postop atrial fibrillation: No recent recurrence  Hypertension: Blood pressure normal on current therapy, will continue to uptitrate heart failure therapy.  Increase Entresto to 49-51 mg twice a day.  Hyperlipidemia: Continue Lipitor  History of mitral valve repair: Stable on last echocardiogram  History of CVA: Recent CVA with acute pontine infarction.  Seen by neurology service.  Recommended aspirin and Plavix for 3 weeks before dropping the aspirin.  He is currently on Plavix monotherapy.        Medication Adjustments/Labs and Tests Ordered: Current medicines are reviewed at length with the patient today.  Concerns regarding medicines are outlined above.  Orders Placed This Encounter  Procedures   Basic metabolic panel   Meds ordered this encounter  Medications   DISCONTD: dapagliflozin propanediol (FARXIGA) 10 MG TABS tablet    Sig: Take 1 tablet (10 mg total) by mouth daily before breakfast.    Dispense:  30 tablet    Refill:  11   sacubitril-valsartan (ENTRESTO) 49-51 MG    Sig: Take 1 tablet by mouth 2 (two) times daily.    Dispense:  60 tablet    Refill:  11   dapagliflozin propanediol (FARXIGA) 10 MG TABS tablet    Sig: Take 1 tablet (10 mg total) by mouth daily before breakfast.    Dispense:  30 tablet    Refill:  11    Patient Instructions  Medication Instructions:  INCREASE FARXIGA 10MG  DAILY  INCREASE ENTRESTO 49/51MG  DAILY  *If you need a refill on your cardiac medications before your next appointment, please call your pharmacy*  Lab Work: BMET BEFORE  FOLLOW UP APPOINTMENT-THIS IS NOT FASTING If you have labs (blood work) drawn today and your tests are completely normal, you will receive your results only by:  [place] (if you have MyChart) OR A paper copy in the mail.  If you have any lab test that is abnormal or we need to change your treatment, we will call you to review the results. You may go to any [facility] that is convenient for you however, we do have a lab in our office that is able to assist you. You DO NOT need an appointment for our lab. The lab is open 8:00am and closes at 4:00pm. Lunch 12:45 - 1:45pm.  Testing/Procedures: NONE  Follow-Up: Your next appointment:  3-4 week(s) In Person with  Christopher Lave, PA-C  AND 3 MONTHS WITH DR HILTY   At [facility], you and your health needs are our priority.  As part of our continuing mission to provide you with exceptional heart care, we have created  designated Provider Care Teams.  These Care Teams include your primary Cardiologist (physician) and Advanced Practice Providers (APPs -  Physician Assistants and Nurse Practitioners) who all work together to provide you with the care you need, when you need it.           Hilbert Corrigan, [place]  03/15/2021 9:52 PM     [facility]

## 2021-03-14 ENCOUNTER — Telehealth: Payer: Self-pay

## 2021-03-14 NOTE — Telephone Encounter (Signed)
Called patient and asked if he could come to the office to fill out the application for the assistance for Entresto. Christopher [name] stated that he will come into the office within the next 20-30 minutes.

## 2021-03-14 NOTE — Telephone Encounter (Signed)
Patient come into the office to fill out the application for patient assistance for his Entresto. I have faxed the completed application to the foundation.

## 2021-03-14 NOTE — Telephone Encounter (Signed)
I have called and spoke with Christopher [name], [place], please start medication assistance program for the [facility]. Christopher [name] is still trying to figure out the cost of farxiga at this time. If he is not covered by medication assistance program for entresto, I may decide to switch him to valsartan by itself later.

## 2021-03-15 ENCOUNTER — Encounter: Payer: Self-pay | Admitting: Physician Assistant

## 2021-03-16 ENCOUNTER — Other Ambulatory Visit: Payer: Self-pay | Admitting: Internal Medicine

## 2021-03-17 ENCOUNTER — Ambulatory Visit: Payer: Medicare Other | Admitting: Internal Medicine

## 2021-03-17 DIAGNOSIS — E1122 Type 2 diabetes mellitus with diabetic chronic kidney disease: Secondary | ICD-10-CM | POA: Diagnosis not present

## 2021-03-17 DIAGNOSIS — I1310 Hypertensive heart and chronic kidney disease without heart failure, with stage 1 through stage 4 chronic kidney disease, or unspecified chronic kidney disease: Secondary | ICD-10-CM | POA: Diagnosis not present

## 2021-03-17 DIAGNOSIS — I69398 Other sequelae of cerebral infarction: Secondary | ICD-10-CM | POA: Diagnosis not present

## 2021-03-17 DIAGNOSIS — H538 Other visual disturbances: Secondary | ICD-10-CM | POA: Diagnosis not present

## 2021-03-17 DIAGNOSIS — R42 Dizziness and giddiness: Secondary | ICD-10-CM | POA: Diagnosis not present

## 2021-03-18 DIAGNOSIS — R42 Dizziness and giddiness: Secondary | ICD-10-CM | POA: Diagnosis not present

## 2021-03-18 DIAGNOSIS — I69398 Other sequelae of cerebral infarction: Secondary | ICD-10-CM | POA: Diagnosis not present

## 2021-03-18 DIAGNOSIS — H538 Other visual disturbances: Secondary | ICD-10-CM | POA: Diagnosis not present

## 2021-03-18 DIAGNOSIS — E1122 Type 2 diabetes mellitus with diabetic chronic kidney disease: Secondary | ICD-10-CM | POA: Diagnosis not present

## 2021-03-18 DIAGNOSIS — I1310 Hypertensive heart and chronic kidney disease without heart failure, with stage 1 through stage 4 chronic kidney disease, or unspecified chronic kidney disease: Secondary | ICD-10-CM | POA: Diagnosis not present

## 2021-03-21 ENCOUNTER — Ambulatory Visit (INDEPENDENT_AMBULATORY_CARE_PROVIDER_SITE_OTHER): Payer: Medicare Other

## 2021-03-21 DIAGNOSIS — I5022 Chronic systolic (congestive) heart failure: Secondary | ICD-10-CM | POA: Diagnosis not present

## 2021-03-21 DIAGNOSIS — Z95810 Presence of automatic (implantable) cardiac defibrillator: Secondary | ICD-10-CM | POA: Diagnosis not present

## 2021-03-21 NOTE — Progress Notes (Signed)
EPIC Encounter for ICM Monitoring  Patient Name: Christopher [name] is a 70 y.o. male Date: 03/21/2021 Primary Care Physican: Hoyt Koch, MD Primary Cardiologist: [facility] Electrophysiologist: Allred 03/21/2021 Weight: 229 lbs                             Spoke with patient and heart failure questions reviewed.  Pt asymptomatic for fluid accumulation and feeling well.  Discussed limiting salt and fluid intake.  He has been eating packages of beer nuts and label has 210 mg per oz.  He said he eats a lot of the snack and advised to avoid if possible.    Optivol thoracic impedance suggesting possible fluid accumulation correlating with hospital admission on 8/12.    Spironolactone 25 mg take 0.5 tablet (12.5 total) daily   Labs: 02/18/2021 Creatinine 1.04, BUN 13, Potassium 3.4, Sodium 137, GFR >60 02/17/2021 Creatinine 0.95, BUN 14, Potassium 3.2, Sodium 136, GFR >60  02/16/2021 Creatinine 1.09, BUN 16, Potassium 3.9, Sodium 135, GFR >60  02/15/2021 Creatinine 0.90, BUN 11, Potassium 3.5, Sodium 134, GFR >60  02/14/2021 Creatinine 0.94, BUN 11, Potassium 3.4, Sodium 133, GFR >60  A complete set of results can be found in Results Review.   Recommendations:  Recommendation to limit salt intake to 2000 mg daily and fluid intake to 64 oz daily.  Encouraged to call if experiencing any fluid symptoms.    Follow-up plan: ICM clinic phone appointment on 03/31/2021 to recheck fluid levels.   91 day device clinic remote transmission 05/19/2021.      EP/Cardiology Office Visits: Recall 12/28/2020 with Dr Rayann Heman.  04/01/2021 with Almyra Deforest, PA   Copy of ICM check sent to Dr. Rayann Heman.    Copy sent to Almyra Deforest, [place] for review since he had 9/8 OV with patient.   3 month ICM trend: 03/21/2021.    1 Year ICM trend:       Rosalene Billings, RN 03/21/2021 11:41 AM

## 2021-03-25 DIAGNOSIS — H538 Other visual disturbances: Secondary | ICD-10-CM | POA: Diagnosis not present

## 2021-03-25 DIAGNOSIS — I1310 Hypertensive heart and chronic kidney disease without heart failure, with stage 1 through stage 4 chronic kidney disease, or unspecified chronic kidney disease: Secondary | ICD-10-CM | POA: Diagnosis not present

## 2021-03-25 DIAGNOSIS — R42 Dizziness and giddiness: Secondary | ICD-10-CM | POA: Diagnosis not present

## 2021-03-25 DIAGNOSIS — I69398 Other sequelae of cerebral infarction: Secondary | ICD-10-CM | POA: Diagnosis not present

## 2021-03-25 DIAGNOSIS — E1122 Type 2 diabetes mellitus with diabetic chronic kidney disease: Secondary | ICD-10-CM | POA: Diagnosis not present

## 2021-03-28 DIAGNOSIS — I5022 Chronic systolic (congestive) heart failure: Secondary | ICD-10-CM | POA: Diagnosis not present

## 2021-03-28 DIAGNOSIS — Z79899 Other long term (current) drug therapy: Secondary | ICD-10-CM | POA: Diagnosis not present

## 2021-03-28 LAB — BASIC METABOLIC PANEL
BUN/Creatinine Ratio: 16 (ref 10–24)
BUN: 21 mg/dL (ref 8–27)
CO2: 23 mmol/L (ref 20–29)
Calcium: 9.5 mg/dL (ref 8.6–10.2)
Chloride: 100 mmol/L (ref 96–106)
Creatinine, Ser: 1.28 mg/dL — ABNORMAL HIGH (ref 0.76–1.27)
Glucose: 79 mg/dL (ref 65–99)
Potassium: 4 mmol/L (ref 3.5–5.2)
Sodium: 140 mmol/L (ref 134–144)
eGFR: 60 mL/min/{1.73_m2} (ref >59)

## 2021-03-31 ENCOUNTER — Other Ambulatory Visit: Payer: Self-pay

## 2021-03-31 ENCOUNTER — Ambulatory Visit (INDEPENDENT_AMBULATORY_CARE_PROVIDER_SITE_OTHER): Payer: Medicare Other | Admitting: Pharmacist

## 2021-03-31 ENCOUNTER — Telehealth: Payer: Self-pay | Admitting: Pharmacist

## 2021-03-31 ENCOUNTER — Ambulatory Visit (INDEPENDENT_AMBULATORY_CARE_PROVIDER_SITE_OTHER): Payer: Medicare Other

## 2021-03-31 DIAGNOSIS — E118 Type 2 diabetes mellitus with unspecified complications: Secondary | ICD-10-CM

## 2021-03-31 DIAGNOSIS — I480 Paroxysmal atrial fibrillation: Secondary | ICD-10-CM

## 2021-03-31 DIAGNOSIS — I2510 Atherosclerotic heart disease of native coronary artery without angina pectoris: Secondary | ICD-10-CM

## 2021-03-31 DIAGNOSIS — I10 Essential (primary) hypertension: Secondary | ICD-10-CM

## 2021-03-31 DIAGNOSIS — Z95810 Presence of automatic (implantable) cardiac defibrillator: Secondary | ICD-10-CM

## 2021-03-31 DIAGNOSIS — E785 Hyperlipidemia, unspecified: Secondary | ICD-10-CM

## 2021-03-31 DIAGNOSIS — I5022 Chronic systolic (congestive) heart failure: Secondary | ICD-10-CM

## 2021-03-31 DIAGNOSIS — Z8673 Personal history of transient ischemic attack (TIA), and cerebral infarction without residual deficits: Secondary | ICD-10-CM

## 2021-03-31 DIAGNOSIS — E1169 Type 2 diabetes mellitus with other specified complication: Secondary | ICD-10-CM

## 2021-03-31 NOTE — Progress Notes (Signed)
Stable kidney function and electrolyte. Patient is scheduled to see me tomorrow.

## 2021-03-31 NOTE — Patient Instructions (Signed)
Visit Information  Phone number for Pharmacist: [phone]   Goals Addressed             This Visit's Progress    Manage My Medicine       Timeframe:  Long-Range Goal Priority:  Medium Start Date:    02/24/21                         Expected End Date:   02/24/22                    Follow Up Date Dec 2022   - call for medicine refill 2 or 3 days before it runs out - call if I am sick and can't take my medicine - keep a list of all the medicines I take; vitamins and herbals too - use a pillbox to sort medicine  -Use [employer] grant for Lisabeth Register refills at [place]   Why is this important?   These steps will help you keep on track with your medicines.   Notes:      Monitor and Manage My Blood Sugar-Diabetes Type 2       Timeframe:  Long-Range Goal Priority:  High Start Date:      02/24/21                       Expected End Date:  07/05/21                     Follow Up Date Dec 2022   - check blood sugar at prescribed times - check blood sugar if I feel it is too high or too low - take the blood sugar meter to all doctor visits -Blood sugar < 70 is too low, if this occurs treat with 1/2 glass juice, regular soda or sugary snack then re-check sugar in 15 minutes    Why is this important?   Checking your blood sugar at home helps to keep it from getting very high or very low.  Writing the results in a diary or log helps the doctor know how to care for you.  Your blood sugar log should have the time, date and the results.  Also, write down the amount of insulin or other medicine that you take.  Other information, like what you ate, exercise done and how you were feeling, will also be helpful.     Notes:         Care Plan : [place]  Updates made by Charlton Haws, [place] since 03/31/2021 12:00 AM     Problem: Hypertension, Hyperlipidemia, Diabetes, Atrial Fibrillation, Heart Failure, and Coronary Artery Disease   Priority: High      Long-Range Goal: Disease management   Start Date: 02/24/2021  Expected End Date: 02/24/2022  Recent Progress: On track  Priority: High  Note:   Current Barriers:  Unable to independently afford treatment regimen Unable to independently monitor therapeutic efficacy  Pharmacist Clinical Goal(s):  Patient will verbalize ability to afford treatment regimen achieve adherence to monitoring guidelines and medication adherence to achieve therapeutic efficacy through collaboration with PharmD and provider.   Interventions: 1:1 collaboration with Hoyt Koch, MD regarding development and update of comprehensive plan of care as evidenced by provider attestation and co-signature Inter-disciplinary care team collaboration (see longitudinal plan of care) Comprehensive medication review performed; medication list updated in electronic medical record  Heart Failure / Hypertension  HF Type: Diastolic (grade II dysfunction), mild LVH Last ejection fraction: 55-60% (01/26/2020) NYHA Class: I (no actitivty limitation)   BP goal is:  <130/80 Patient checks BP at home infrequently Patient home BP readings are ranging: 138/80   Patient has failed these meds in past: ramipril, amlodipine Patient is currently controlled on the following medications:  Metoprolol succinate 100 mg daily PM Entresto 49-51 mg BID Spironolactone 25 mg - 1/2 tab daily Farxiga 10 mg daily   We discussed: pt reports [place] and Delene Loll are too expensive to continue long term; he applied for [facility] pt assistance through cardiology office and has not heard anything in several weeks; pt reports income < $40,000 per year; in the interest of simplifying medication access, enrolled pt in [place] for cardiomyopathy so he can continue to receive meds through his pharmacy   Plan: Continue current medications  [employer] grant approved for Lisabeth Register thru 02/28/2022   AFIB    Patient is currently rate  controlled Patient has failed these meds in past: Eliquis, Xarelto, warfarin Patient is currently controlled on the following medications:  Metoprolol succinate 100 mg daily AM Aspirin 81 mg daily AM   We discussed: Pt is not on anticoagulation d/t hx of thigh hematoma while on Xarelto   Plan: Continue current medications   Hyperlipidemia / CAD    LDL goal < 70 CAD, NICM; stroke 02/2021  Patient has failed these meds in past: n/a Patient is currently controlled on the following medications:  Atorvastatin 40 mg daily PM Fenofibrate 145 mg daily AM Clopidogrel 75 mg daily   We discussed:  pt has completed 3-week course of DAPT and continues clopidogrel monotherapy for recent stroke; counseled on indication for clopidogrel and risk for bleeding   Plan: Continue current medications   Diabetes    A1c goal <7% Fasting BG goal 80-130 Post-prandial BG goal < 180  Recent home BG: AM - 95-105   Patient has failed these meds in past: Jardiance 25 mg (cost $400) Patient is currently controlled on the following medications: Metformin 1000 mg BID Humalog 75/25 mix - 22 units BID Testing supplies (Accu-Chek)   We discussed: A1c increased drastically between March and August (6.8 to 11.5%), pt reports 1 episode of weakness/clamminess that may have been hypoglycemia but pt did not check sugar at the time because he did not want to "waste his strips", he drank orange juice and felt better -Pt reports he ordered a year supply of testing supplies "online", on further questioning this was not through his insurance/mail order pharmacy but through [employer]; advised pt diabetes supplies are covered under insurance and he can get them through his pharmacy in the future -counseled on hypoglycemia prevention, rule of 15s; advised to always check BG if experiencing s/sx of hypoglycemia   Plan: Continue current medications  Patient Goals/Self-Care Activities Patient will:  - take medications  as prescribed -focus on medication adherence by routine -check glucose daily, document, and provide at future appointments -check blood pressure 3x a week, document, and provide at future appointments      Patient verbalizes understanding of instructions provided today and agrees to view in [place].  Telephone follow up appointment with pharmacy team member scheduled for: 3 months  Charlene Brooke, PharmD, [place], CPP Clinical Pharmacist [facility] [place] [phone]

## 2021-03-31 NOTE — Telephone Encounter (Signed)
Enrolled patient in [place], which will cover both [place] and Entresto. Contacted [facility] pharmacy with card information so refills should be $0 for both meds.  [employer] grant info: Active dates: 03/01/2021 - 02/28/2022 BIN: [id number] PCN: [id number] GRP: [id number] ID: [id number]

## 2021-03-31 NOTE — Progress Notes (Signed)
EPIC Encounter for ICM Monitoring  Patient Name: Christopher [name] is a 70 y.o. male Date: 03/31/2021 Primary Care Physican: Hoyt Koch, MD Primary Cardiologist: [facility] Electrophysiologist: Allred 03/21/2021 Weight: 229 lbs                             Spoke with patient and heart failure questions reviewed.  Pt asymptomatic for fluid accumulation and feeling well.  He stopped eating packaged beer nuts that were high in salt.    Optivol thoracic impedance suggesting fluid levels have returned close to normal. Fluid index remains above normal threshold.   Spironolactone 25 mg take 0.5 tablet (12.5 total) daily   Labs: 02/18/2021 Creatinine 1.04, BUN 13, Potassium 3.4, Sodium 137, GFR >60 02/17/2021 Creatinine 0.95, BUN 14, Potassium 3.2, Sodium 136, GFR >60  02/16/2021 Creatinine 1.09, BUN 16, Potassium 3.9, Sodium 135, GFR >60  02/15/2021 Creatinine 0.90, BUN 11, Potassium 3.5, Sodium 134, GFR >60  02/14/2021 Creatinine 0.94, BUN 11, Potassium 3.4, Sodium 133, GFR >60  A complete set of results can be found in Results Review.   Recommendations:  Recommendation to continue to limit salt intake.  Encouraged to call if experiencing any fluid symptoms.    Follow-up plan: ICM clinic phone appointment on 04/28/2021 to recheck fluid levels.   91 day device clinic remote transmission 05/19/2021.      EP/Cardiology Office Visits: Recall 12/28/2020 with Dr Rayann Heman.  04/01/2021 with Almyra Deforest, PA   Copy of ICM check sent to Dr. Rayann Heman.   3 month ICM trend: 03/31/2021.    1 Year ICM trend:       Rosalene Billings, RN 03/31/2021 2:36 PM

## 2021-03-31 NOTE — Progress Notes (Cosign Needed Addendum)
Chronic Care Management Pharmacy Note  04/01/2021 Name:  Christopher [name] MRN:  [id number] DOB:  31-Jul-1950  Summary: -Pt was started on Farxiga 10 mg and Sherryll Burger was increased last month, he cannot afford copays for either -Pt reports fasting BG 90-110, he endorses one episode of hypoglycemic symptoms (clamminess, weakness) but did not check BG at the time  Recommendations/Changes made from today's visit: -Enrolled pt in [employer] grant (cardiomyopathy) for Sherryll Burger and Farxiga -Counseled on hypoglycemia rule of 15s   Subjective: Christopher [name] is an 70 y.o. year old male who is a primary patient of Myrlene Broker, MD.  The CCM team was consulted for assistance with disease management and care coordination needs.    Engaged with patient by telephone for follow up visit in response to provider referral for pharmacy case management and/or care coordination services.   Consent to Services:  The patient was given information about Chronic Care Management services, agreed to services, and gave verbal consent prior to initiation of services.  Please see initial visit note for detailed documentation.   Patient Care Team: Myrlene Broker, MD as PCP - General (Internal Medicine) [name] Range, MD as PCP - Electrophysiology (Cardiology) Rennis Golden Lisette Abu, MD as PCP - Cardiology (Cardiology) [name], MD as Attending Physician (Cardiology) [name], MD (Inactive) as Attending Physician (Cardiothoracic Surgery) Waymon Budge, MD as Attending Physician (Pulmonary Disease) Tonny Bollman, MD as Attending Physician (Cardiology) Charna Elizabeth, MD as Consulting Physician (Gastroenterology) [name] Range, MD (Cardiology) Kathyrn Sheriff, [facility] as Pharmacist (Pharmacist)  Recent office visits: 02/27/21 Dr Okey Dupre OV: hospital f/u (post-stroke); changing Novolog to Humalog mix 75/25  09/16/20 Dr Okey Dupre OV: DM f/u. A1c 6.8%. no changes.  Recent consult  visits: 03/13/21 PA Lisabeth Devoid (cardiology): f/u CAD, HF. Increased Entresto to 49-50 mg BID. Given 30-day free coupon. Add Farxiga 10 mg daily. Started PAP for Entresto.  Hospital visits: Medication Reconciliation was completed by comparing discharge summary, patient's EMR and Pharmacy list, and upon discussion with patient.  Admitted to the hospital on 02/14/21 due to Stroke. Discharge date was 02/18/21. Discharged from [facility].    New?Medications Started at [facility] Discharge:?? -started Clopidogrel, aspirin x 3 weeks, then clopidogrel alone -started Entresto 24-26 mg BID -started spironolactone 12.5 mg daily -Started Novolog 70/30 22 units BID (A1c 11.5%)  Medications Discontinued at Hospital Discharge: -Stopped amlodipine, ramipril due to starting Entresto  Medications that remain the same after Hospital Discharge:??  -All other medications will remain the same.     Objective:  Lab Results  Component Value Date   CREATININE 1.28 (H) 03/28/2021   BUN 21 03/28/2021   GFR 56.84 (L) 02/27/2021   GFRNONAA >60 02/18/2021   GFRAA >60 03/31/2020   NA 140 03/28/2021   K 4.0 03/28/2021   CALCIUM 9.5 03/28/2021   CO2 23 03/28/2021   GLUCOSE 79 03/28/2021    Lab Results  Component Value Date/Time   HGBA1C 11.5 (H) 02/15/2021 04:58 AM   HGBA1C 6.8 (A) 09/16/2020 01:42 PM   HGBA1C 6.3 05/16/2020 03:53 PM   FRUCTOSAMINE 246 01/13/2018 10:29 AM   GFR 56.84 (L) 02/27/2021 09:23 AM   GFR 76.13 05/16/2020 03:53 PM   MICROALBUR 1.8 07/18/2019 03:00 PM    Last diabetic Eye exam:  Lab Results  Component Value Date/Time   HMDIABEYEEXA No Retinopathy 04/16/2020 12:00 AM    Last diabetic Foot exam: No results found for: HMDIABFOOTEX   Lab Results  Component Value Date  CHOL 125 02/15/2021   HDL 30 (L) 02/15/2021   LDLCALC 34 02/15/2021   LDLDIRECT 52.0 07/18/2019   TRIG 303 (H) 02/15/2021   CHOLHDL 4.2 02/15/2021    Hepatic Function Latest Ref Rng & Units 02/27/2021  05/16/2020 07/18/2019  Total Protein 6.0 - 8.3 g/dL 6.9 6.9 6.9  Albumin 3.5 - 5.2 g/dL 4.2 4.3 4.2  AST 0 - 37 U/L $Remo'21 23 23  'KWrRl$ ALT 0 - 53 U/L $Remo'22 23 30  'ubUJd$ Alk Phosphatase 39 - 117 U/L 44 41 65  Total Bilirubin 0.2 - 1.2 mg/dL 0.8 0.6 0.6  Bilirubin, Direct 0.00 - 0.40 mg/dL - - -    Lab Results  Component Value Date/Time   TSH 3.780 01/03/2020 01:18 PM   TSH 1.966 05/05/2017 04:29 PM   TSH 2.75 11/01/2012 12:37 PM    CBC Latest Ref Rng & Units 02/27/2021 02/16/2021 02/14/2021  WBC 4.0 - 10.5 K/uL 6.1 6.3 5.7  Hemoglobin 13.0 - 17.0 g/dL 14.5 14.4 14.6  Hematocrit 39.0 - 52.0 % 43.1 41.3 42.1  Platelets 150.0 - 400.0 K/uL 159.0 147(L) 168    No results found for: VD25OH  Clinical ASCVD: Yes  The ASCVD Risk score (Arnett DK, et al., 2019) failed to calculate for the following reasons:   The valid total cholesterol range is 130 to 320 mg/dL    Depression screen [facility] 2/9 09/05/2020 09/05/2020 05/24/2019  Decreased Interest 0 0 0  Down, Depressed, Hopeless 0 0 0  PHQ - 2 Score 0 0 0  Some recent data might be hidden     Social History   Tobacco Use  Smoking Status Former   Packs/day: 2.50   Years: 5.00   Pack years: 12.50   Types: Cigarettes   Quit date: 07/06/1978   Years since quitting: 42.7  Smokeless Tobacco Never  Tobacco Comments   social drinker   BP Readings from Last 3 Encounters:  03/13/21 120/76  02/27/21 130/80  02/18/21 (!) 136/99   Pulse Readings from Last 3 Encounters:  03/13/21 78  02/27/21 74  02/18/21 71   Wt Readings from Last 3 Encounters:  03/13/21 234 lb 6.4 oz (106.3 kg)  02/27/21 230 lb 3.2 oz (104.4 kg)  02/17/21 229 lb 4.5 oz (104 kg)   BMI Readings from Last 3 Encounters:  03/13/21 33.63 kg/m  02/27/21 33.03 kg/m  02/17/21 32.90 kg/m    Assessment/Interventions: Review of patient past medical history, allergies, medications, health status, including review of consultants reports, laboratory and other test data, was performed as part of  comprehensive evaluation and provision of chronic care management services.   SDOH:  (Social Determinants of Health) assessments and interventions performed: Yes  SDOH Screenings   Alcohol Screen: Low Risk    Last Alcohol Screening Score (AUDIT): 0  Depression (PHQ2-9): Low Risk    PHQ-2 Score: 0  Financial Resource Strain: Low Risk    Difficulty of Paying Living Expenses: Not hard at all  Food Insecurity: No Food Insecurity   Worried About Charity fundraiser in the Last Year: Never true   Ran Out of Food in the Last Year: Never true  Housing: Low Risk    Last Housing Risk Score: 0  Physical Activity: Sufficiently Active   Days of Exercise per Week: 5 days   Minutes of Exercise per Session: 30 min  Social Connections: Moderately Integrated   Frequency of Communication with Friends and Family: More than three times a week   Frequency of Social Gatherings  with Friends and Family: More than three times a week   Attends Religious Services: More than 4 times per year   Active Member of Clubs or Organizations: No   Attends Music therapist: More than 4 times per year   Marital Status: Divorced  Stress: No Stress Concern Present   Feeling of Stress : Not at all  Tobacco Use: Medium Risk   Smoking Tobacco Use: Former   Smokeless Tobacco Use: Never  Transportation Needs: No Data processing manager (Medical): No   Lack of Transportation (Non-Medical): No    CCM Care Plan  Allergies  Allergen Reactions   Codeine Other (See Comments)    Causes bad constipation   Dust Mite Extract Cough   Pollen Extract Cough    Medications Reviewed Today     Reviewed by Almyra Deforest, PA (Physician Assistant) on 03/15/21 at 2145  Med List Status: <None>   Medication Order Taking? Sig Documenting Provider Last Dose Status Informant  acetaminophen (TYLENOL) 500 MG tablet [id number] Yes Take 500 mg by mouth every 6 (six) hours as needed for mild pain or headache.   [provider] Taking Active Self  atorvastatin (LIPITOR) 40 MG tablet [id number] Yes TAKE 1 TABLET BY MOUTH  DAILY Hoyt Koch, MD Taking Active Self  blood glucose meter kit and supplies KIT [id number] Yes Dispense based on patient and insurance preference. Use up to four times daily as directed. Elmarie Shiley, MD Taking Active            Med Note Richardson Landry, Sherrie Sport Mar 13, 2021  8:36 AM)    cetirizine (ZYRTEC) 10 MG tablet [id number] Yes Take 10 mg by mouth daily. [provider] Taking Active Self  cholecalciferol (VITAMIN D) 25 MCG (1000 UNIT) tablet [id number] Yes Take 1 tablet by mouth daily.  [provider] Taking Active Self  clopidogrel (PLAVIX) 75 MG tablet [id number] Yes Take 1 tablet (75 mg total) by mouth daily. Hoyt Koch, MD Taking Active   dapagliflozin propanediol (FARXIGA) 10 MG TABS tablet [id number]  Take 1 tablet (10 mg total) by mouth daily before breakfast. Almyra Deforest, PA  Active   fenofibrate (TRICOR) 145 MG tablet [id number]  TAKE 1 TABLET BY MOUTH  DAILY Hoyt Koch, MD  Active Self  fluticasone [facility]) 50 MCG/ACT nasal spray [id number]  USE 2 SPRAYS IN BOTH  NOSTRILS DAILY Hoyt Koch, MD  Active Self  Insulin Lispro Prot & Lispro (HUMALOG MIX 75/25 KWIKPEN) (75-25) 100 UNIT/ML Claiborne Rigg [id number]  Inject 22 Units into the skin in the morning and at bedtime. Hoyt Koch, MD  Active   metFORMIN (GLUCOPHAGE) 1000 MG tablet [id number]  TAKE 1 TABLET BY MOUTH  TWICE DAILY WITH A MEAL Hoyt Koch, MD  Active Self  metoprolol succinate (TOPROL-XL) 100 MG 24 hr tablet [id number]  TAKE 1 TABLET BY MOUTH  DAILY OR IMMEDIATELY  FOLLOWING A MEAL  Patient taking differently: 100 mg.   Hoyt Koch, MD  Active   sacubitril-valsartan [facility]) 49-51 MG [id number] Yes Take 1 tablet by mouth 2 (two) times daily. Almyra Deforest, [place]  Active   spironolactone (ALDACTONE) 25 MG tablet [id number]   Take 0.5 tablets (12.5 mg total) by mouth daily. Niel Hummer A, MD  Active   tamsulosin (FLOMAX) 0.4 MG CAPS capsule [id number]  TAKE 1 CAPSULE BY MOUTH  DAILY Hoyt Koch, MD  Active Self  Patient Active Problem List   Diagnosis Date Noted   Diplopia 02/15/2021   GERD without esophagitis 02/15/2021   Diabetes mellitus type 2 with complications ([place]) 31/49/7026   Vertigo as late effect of stroke 02/14/2021   Ventricular tachycardia ([place]) 01/03/2020   Nonischemic cardiomyopathy ([place]) 09/27/2017   Heart valve disease 09/27/2017   History of cardiac arrest 09/27/2017   Alcohol use 05/05/2017   Coronary artery disease involving native coronary artery of native heart without angina pectoris 05/05/2017   OSA on CPAP 05/05/2017   History of repair of mitral valve 05/05/2017   Benign prostatic hyperplasia 05/16/2015   Routine general medical examination at a health care facility 37/85/8850   Chronic systolic CHF (congestive heart failure) ([place]) 10/25/2012   AF (paroxysmal atrial fibrillation) ([place]) 10/25/2012   Hyperlipidemia associated with type 2 diabetes mellitus ([place]) 09/04/2012   Severe mitral regurgitation    Coronary artery disease 08/12/2012   Essential hypertension 09/27/2008   SLEEP APNEA 09/27/2008    Immunization History  Administered Date(s) Administered   Fluad Quad(high Dose 65+) 03/04/2019, 05/16/2020   Influenza, High Dose Seasonal PF 05/03/2015, 04/15/2017, 04/22/2018   Influenza,inj,Quad PF,6+ Mos 04/09/2014, 05/11/2016   Influenza-Unspecified 04/26/2013, 05/09/2015   PFIZER(Purple Top)SARS-COV-2 Vaccination 08/18/2019, 09/10/2019, 05/18/2020   Pneumococcal Conjugate-13 11/13/2014   Pneumococcal Polysaccharide-23 05/11/2016   Pneumococcal-Unspecified 02/24/2010   Tdap 08/20/2010   Zoster Recombinat (Shingrix) 04/26/2017, 07/01/2017   Zoster, Live 08/06/2009    Conditions to be addressed/monitored:  Hypertension, Hyperlipidemia,  Diabetes, Atrial Fibrillation, Heart Failure, and Coronary Artery Disease  Patient Care Plan: CCM Pharmacy Care Plan     Problem Identified: Hypertension, Hyperlipidemia, Diabetes, Atrial Fibrillation, Heart Failure, and Coronary Artery Disease   Priority: High     Long-Range Goal: Disease management   Start Date: 02/24/2021  Expected End Date: 02/24/2022  Recent Progress: On track  Priority: High  Note:   Current Barriers:  Unable to independently afford treatment regimen Unable to independently monitor therapeutic efficacy  Pharmacist Clinical Goal(s):  Patient will verbalize ability to afford treatment regimen achieve adherence to monitoring guidelines and medication adherence to achieve therapeutic efficacy through collaboration with PharmD and provider.   Interventions: 1:1 collaboration with Hoyt Koch, MD regarding development and update of comprehensive plan of care as evidenced by provider attestation and co-signature Inter-disciplinary care team collaboration (see longitudinal plan of care) Comprehensive medication review performed; medication list updated in electronic medical record  Heart Failure / Hypertension    HF Type: Diastolic (grade II dysfunction), mild LVH Last ejection fraction: 55-60% (01/26/2020) NYHA Class: I (no actitivty limitation)   BP goal is:  <130/80 Patient checks BP at home infrequently Patient home BP readings are ranging: 138/80   Patient has failed these meds in past: ramipril, amlodipine Patient is currently controlled on the following medications:  Metoprolol succinate 100 mg daily PM Entresto 49-51 mg BID Spironolactone 25 mg - 1/2 tab daily Farxiga 10 mg daily   We discussed: pt reports [place] and Delene Loll are too expensive to continue long term; he applied for [facility] pt assistance through cardiology office and has not heard anything in several weeks; pt reports income < $40,000 per year; in the interest of simplifying  medication access, enrolled pt in [place] for cardiomyopathy so he can continue to receive meds through his pharmacy   Plan: Continue current medications  [place] approved for Lisabeth Register thru 02/28/2022   AFIB    Patient is currently rate controlled Patient has failed these meds  in past: Eliquis, Xarelto, warfarin Patient is currently controlled on the following medications:  Metoprolol succinate 100 mg daily AM Aspirin 81 mg daily AM   We discussed: Pt is not on anticoagulation d/t hx of thigh hematoma while on Xarelto   Plan: Continue current medications   Hyperlipidemia / CAD    LDL goal < 70 CAD, NICM; stroke 02/2021  Patient has failed these meds in past: n/a Patient is currently controlled on the following medications:  Atorvastatin 40 mg daily PM Fenofibrate 145 mg daily AM Clopidogrel 75 mg daily   We discussed:  pt has completed 3-week course of DAPT and continues clopidogrel monotherapy for recent stroke; counseled on indication for clopidogrel and risk for bleeding   Plan: Continue current medications   Diabetes    A1c goal <7% Fasting BG goal 80-130 Post-prandial BG goal < 180  Recent home BG: AM - 95-105   Patient has failed these meds in past: Jardiance 25 mg (cost $400) Patient is currently controlled on the following medications: Metformin 1000 mg BID Humalog 75/25 mix - 22 units BID Testing supplies (Accu-Chek)   We discussed: A1c increased drastically between March and August (6.8 to 11.5%), pt reports 1 episode of weakness/clamminess that may have been hypoglycemia but pt did not check sugar at the time because he did not want to "waste his strips", he drank orange juice and felt better -Pt reports he ordered a year supply of testing supplies "online", on further questioning this was not through his insurance/mail order pharmacy but through [employer]; advised pt diabetes supplies are covered under insurance and he can get  them through his pharmacy in the future -counseled on hypoglycemia prevention, rule of 15s; advised to always check BG if experiencing s/sx of hypoglycemia   Plan: Continue current medications  Patient Goals/Self-Care Activities Patient will:  - take medications as prescribed -focus on medication adherence by routine -check glucose daily, document, and provide at future appointments -check blood pressure 3x a week, document, and provide at future appointments     Medication Assistance:  [place] (cardiomyopathy) - Lisabeth Register --enrolled 03/31/2021  Compliance/Adherence/Medication fill history: Care Gaps: Foot exam (due 01/15/21) Vaccines - covid booster, TDAP  Star-Rating Drugs: Atorvastatin - LF 01/10/21 x 90 ds Entresto - LF 02/18/21 x 30 ds Metformin - LF 01/14/21 x 90 ds  Patient's preferred pharmacy is:  [facility] - [place], [place] - [address] [place] [address] Phone: [phone] Fax: [phone]  [id number] [address] [place] [address] Phone: [phone] Fax: [phone]  Uses pill box? Yes Pt endorses 100% compliance  We discussed: Current pharmacy is preferred with insurance plan and patient is satisfied with pharmacy services Patient decided to: Continue current medication management strategy  Care Plan and Follow Up Patient Decision:  Patient agrees to Care Plan and Follow-up.  Plan: Telephone follow up appointment with care management team member scheduled for:  3 month  Charlene Brooke, PharmD, [place], CPP Clinical Pharmacist [facility] [phone]

## 2021-04-01 ENCOUNTER — Ambulatory Visit: Payer: Medicare Other | Admitting: Physician Assistant

## 2021-04-01 ENCOUNTER — Encounter: Payer: Self-pay | Admitting: Physician Assistant

## 2021-04-01 ENCOUNTER — Other Ambulatory Visit: Payer: Self-pay | Admitting: Physician Assistant

## 2021-04-01 VITALS — BP 134/72 | HR 72 | Ht 70.0 in | Wt 238.0 lb

## 2021-04-01 DIAGNOSIS — I2510 Atherosclerotic heart disease of native coronary artery without angina pectoris: Secondary | ICD-10-CM

## 2021-04-01 DIAGNOSIS — Z8673 Personal history of transient ischemic attack (TIA), and cerebral infarction without residual deficits: Secondary | ICD-10-CM | POA: Diagnosis not present

## 2021-04-01 DIAGNOSIS — E785 Hyperlipidemia, unspecified: Secondary | ICD-10-CM | POA: Diagnosis not present

## 2021-04-01 DIAGNOSIS — I10 Essential (primary) hypertension: Secondary | ICD-10-CM | POA: Diagnosis not present

## 2021-04-01 DIAGNOSIS — Z9889 Other specified postprocedural states: Secondary | ICD-10-CM

## 2021-04-01 DIAGNOSIS — Z95810 Presence of automatic (implantable) cardiac defibrillator: Secondary | ICD-10-CM

## 2021-04-01 DIAGNOSIS — I5022 Chronic systolic (congestive) heart failure: Secondary | ICD-10-CM | POA: Diagnosis not present

## 2021-04-01 MED ORDER — SACUBITRIL-VALSARTAN 97-103 MG PO TABS: 1.0000 | ORAL_TABLET | Freq: Two times a day (BID) | ORAL | 3 refills | Status: DC

## 2021-04-01 NOTE — Patient Instructions (Addendum)
Medication Instructions:  INCREASE Entresto to 97 mg-103 mg 2 times a day   *If you need a refill on your cardiac medications before your next appointment, please call your pharmacy*  Lab Work: Your physician recommends that you return for lab work in 2 weeks:  BMET  If you have labs (blood work) drawn today and your tests are completely normal, you will receive your results only by: [facility] (if you have MyChart) OR A paper copy in the mail If you have any lab test that is abnormal or we need to change your treatment, we will call you to review the results.  Testing/Procedures: Your physician has requested that you have an echocardiogram. Echocardiography is a painless test that uses sound waves to create images of your heart. It provides your doctor with information about the size and shape of your heart and how well your heart's chambers and valves are working. This procedure takes approximately one hour. There are no restrictions for this procedure.  Please schedule for end of November or early December   Follow-Up: At [facility], you and your health needs are our priority.  As part of our continuing mission to provide you with exceptional heart care, we have created designated Provider Care Teams.  These Care Teams include your primary Cardiologist (physician) and Advanced Practice Providers (APPs -  Physician Assistants and Nurse Practitioners) who all work together to provide you with the care you need, when you need it.   Your next appointment:   As scheduled   The format for your next appointment:   In Person  Provider:   K. Mali Hilty, MD  Other Instructions

## 2021-04-01 NOTE — Progress Notes (Signed)
Cardiology Office Note:    Date:  04/03/2021   ID:  Christopher [name], DOB 01/04/1951, MRN [id number]  PCP:  Crawford, Elizabeth A, MD   [facility] Providers Cardiologist:  Kenneth C Hilty, MD Electrophysiologist:  Kymir Allred, MD     Referring MD: Crawford, Elizabeth A, *   No chief complaint on file.   History of Present Illness:    Christopher [name] is a Christopher [name] with a hx of CAD s/p PCI to RCA 2014, SBE, severe MR s/p repair 2014, cardiac arrest s/p Medtronic single chamber ICD 2018, NICM, postop afib, HTN, HLD, and OSA.  Patient was previously followed by Dr. McLean in the office.  He was admitted to [facility] on 05/05/2017 after a syncopal event while at [employer].  He was successfully defibrillated with ROSC and underwent ICD implantation by Dr. Allred.  Left heart cath during that admission showed nonobstructive CAD with moderately reduced LVEF.  Echocardiogram obtained on 05/07/2017 showed EF 40 to 45%.  After the event, patient was followed by Dr. End. By April 2019, ejection fraction has improved to 50 to 55% on echo.  Last echocardiogram obtained on 01/26/2020 showed EF 55 to 60%, grade 2 DD, elevated LVEDP, mild mitral stenosis, trivial MR, mild AI.   Patient was most recently seen in the hospital in August 2022 for diplopia.  Work-up with MRI revealed acute pontine stroke.  He was placed on aspirin and Plavix with plan to transition to Plavix monotherapy after 3 weeks.  EKG showed sinus rhythm with bifascicular block and PVCs.  Echocardiogram was performed which showed newly reduced EF of 30 to 35% with anterior apical and inferior apical wall motion abnormality suggestive of Takotsubo versus LAD territory wall motion abnormality.  Given the concern of acute stroke, no ischemic work-up was performed.  Patient was started on Entresto after ACE inhibitor washout.  Spironolactone was added.  Since discharge, repeat blood work obtained on 02/27/2021 showed stable renal  function and electrolyte.  He has since came off of aspirin and started Plavix monotherapy.  I last saw the patient on 03/13/2021, initially after discharge, he had dizziness however symptom has improved.  I increase his Entresto to 49-51 mg twice a day.  We also added Farxiga 10 mg daily.  He denies any chest pain or worsening dyspnea.  We have reviewed the recent blood work from last Friday.  Blood pressure is still in the 130s, I will increase his Entresto to 97-103 mg twice a day.  He is due for a repeat echocardiogram in late November or early December prior to follow-up with Dr. Hilty.   Past Medical History:  Diagnosis Date   Allergy    Arthritis    Atrial fibrillation (HCC)    post op, intol of anticoag   Cardiac arrest (HCC)    a. s/p MDT single chamber ICD; 11-10-18- pt denies having a heart attack   Chronic kidney disease    kidney stone   Coronary artery disease    a. 2/7 Cath: LM nl, LAD min irregs, LCX min irregs, RI 40, RCA 95m, EF 55-60% basal to mid inf HK, 3-4+ MR;  b. 08/25/2012 PCI of RCA with 4.0x15 Vision BMS   Diabetes mellitus without complication (HCC)    GERD (gastroesophageal reflux disease)    hx   Heart murmur    Hyperlipidemia    on statin   Hypertension    S/P mitral valve repair 09/27/2012   Complex   valvuloplasty including triangular resection of flail posterior leaflet with 30 mm Sorin Memo 3D ring annuloplasty via right mini thoracotomy approach   Severe mitral regurgitation    a. Mitral valve prolapse with flail segment of posterior leaflet and severe MR by TEE, remote h/o bacterial endocarditis    Sleep apnea    NPSG 01/21/06- AHI 40.7/hr cpap   Subacute bacterial endocarditis 03/22/2008   Strep viridans   Ventricular fibrillation ([place]) 11/07/2019   appropriate shock (36J) for VF delivered    Past Surgical History:  Procedure Laterality Date   AMPUTATION  07/28/2012   Procedure: AMPUTATION DIGIT;  Surgeon: Colin Rhein, MD;  Location: [facility];   Service: Orthopedics;  Laterality: Right;  2nd toe   CARDIAC CATHETERIZATION     COLONOSCOPY     EP IMPLANTABLE DEVICE N/A 05/01/2016   Procedure: Loop Recorder Removal;  Surgeon: Thompson Grayer, MD;  Location: [place] [facility];  Service: Cardiovascular;  Laterality: N/A;   FOOT SURGERY     BILATERAL TOES   ICD IMPLANT N/A 05/07/2017    Medtronic Visia AF MRI VR SureScan implanted by Dr Curt Bears following VF arrest   Implantable loop recorder placement  03/31/13   MDT Linq implanted by Dr Rayann Heman to evaluate for further afib   INTRAOPERATIVE TRANSESOPHAGEAL ECHOCARDIOGRAM N/A 09/27/2012   Procedure: INTRAOPERATIVE TRANSESOPHAGEAL ECHOCARDIOGRAM;  Surgeon: [name], MD;  Location: [place];  Service: Open Heart Surgery;  Laterality: N/A;   LEFT AND RIGHT HEART CATHETERIZATION WITH CORONARY ANGIOGRAM N/A 08/12/2012   Procedure: LEFT AND RIGHT HEART CATHETERIZATION WITH CORONARY ANGIOGRAM;  Surgeon: [name], MD;  Location: [facility] CATH LAB;  Service: Cardiovascular;  Laterality: N/A;   LEFT HEART CATH AND CORONARY ANGIOGRAPHY N/A 05/06/2017   Procedure: LEFT HEART CATH AND CORONARY ANGIOGRAPHY;  Surgeon: [name], MD;  Location: [place] [facility];  Service: Cardiovascular;  Laterality: N/A;   LOOP RECORDER IMPLANT N/A 03/31/2013   Procedure: LOOP RECORDER IMPLANT;  Surgeon: [name], MD;  Location: [place] CATH LAB;  Service: Cardiovascular;  Laterality: N/A;   MITRAL VALVE REPAIR Right 09/27/2012   Procedure: MINIMALLY INVASIVE MITRAL VALVE REPAIR (MVR);  Surgeon: [name], MD;  Location: [place];  Service: Open Heart Surgery;  Laterality: Right;  Ultrasound guided   PERCUTANEOUS CORONARY STENT INTERVENTION (PCI-S) N/A 08/25/2012   Procedure: PERCUTANEOUS CORONARY STENT INTERVENTION (PCI-S);  Surgeon: Sherren Mocha, MD;  Location[facility] CATH LAB;  Service: Cardiovascular;  Laterality: N/A;   SEPTOPLASTY     Dr. [name]   TEE WITHOUT CARDIOVERSION N/A 08/12/2012   Procedure:  TRANSESOPHAGEAL ECHOCARDIOGRAM (TEE);  Surgeon: [name], MD;  Location: [facility] ENDOSCOPY;  Service: Cardiovascular;  Laterality: N/A;   TONSILLECTOMY     UVULOPALATOPHARYNGOPLASTY      Current Medications: Current Meds  Medication Sig   acetaminophen (TYLENOL) 500 MG tablet Take 500 mg by mouth every 6 (six) hours as needed for mild pain or headache.    atorvastatin (LIPITOR) 40 MG tablet TAKE 1 TABLET BY MOUTH  DAILY   blood glucose meter kit and supplies KIT Dispense based on patient and insurance preference. Use up to four times daily as directed.   cetirizine (ZYRTEC) 10 MG tablet Take 10 mg by mouth daily.   cholecalciferol (VITAMIN D) 25 MCG (1000 UNIT) tablet Take 1 tablet by mouth daily.    clopidogrel (PLAVIX) 75 MG tablet Take 1 tablet (75 mg total) by mouth daily.   dapagliflozin propanediol (FARXIGA) 10  MG TABS tablet Take 1 tablet (10 mg total) by mouth daily before breakfast.   fenofibrate (TRICOR) 145 MG tablet TAKE 1 TABLET BY MOUTH  DAILY   fluticasone (FLONASE) 50 MCG/ACT nasal spray USE 2 SPRAYS IN BOTH  NOSTRILS DAILY   Insulin Lispro Prot & Lispro (HUMALOG MIX 75/25 KWIKPEN) (75-25) 100 UNIT/ML Kwikpen Inject 22 Units into the skin in the morning and at bedtime.   metFORMIN (GLUCOPHAGE) 1000 MG tablet TAKE 1 TABLET BY MOUTH  TWICE DAILY WITH A MEAL   metoprolol succinate (TOPROL-XL) 100 MG 24 hr tablet TAKE 1 TABLET BY MOUTH  DAILY OR IMMEDIATELY  FOLLOWING A MEAL (Patient taking differently: 100 mg.)   spironolactone (ALDACTONE) 25 MG tablet Take 0.5 tablets (12.5 mg total) by mouth daily.   tamsulosin (FLOMAX) 0.4 MG CAPS capsule TAKE 1 CAPSULE BY MOUTH  DAILY   [DISCONTINUED] sacubitril-valsartan (ENTRESTO) 49-51 MG Take 1 tablet by mouth 2 (two) times daily.   [DISCONTINUED] sacubitril-valsartan (ENTRESTO) 97-103 MG Take 1 tablet by mouth 2 (two) times daily.     Allergies:   Codeine, Dust mite extract, and Pollen extract   Social History   Socioeconomic  History   Marital status: Divorced    Spouse name: Not on file   Number of children: Not on file   Years of education: Not on file   Highest education level: Not on file  Occupational History   Occupation: Works at [name] Retired  Tobacco Use   Smoking status: Former    Packs/day: 2.50    Years: 5.00    Pack years: 12.50    Types: Cigarettes    Quit date: 07/06/1978    Years since quitting: 42.7   Smokeless tobacco: Never   Tobacco comments:    social drinker  Vaping Use   Vaping Use: Never used  Substance and Sexual Activity   Alcohol use: Yes    Comment: OCC.   Drug use: No   Sexual activity: Yes  Other Topics Concern   Not on file  Social History Narrative   Works at [name] - plans to retire 07/2015 after 36 years.   Divorced, lives alone   Supportive g-friend (shirlee moore)         Social Determinants of Health   Financial Resource Strain: Low Risk    Difficulty of Paying Living Expenses: Not hard at all  Food Insecurity: No Food Insecurity   Worried About Running Out of Food in the Last Year: Never true   Ran Out of Food in the Last Year: Never true  Transportation Needs: No Transportation Needs   Lack of Transportation (Medical): No   Lack of Transportation (Non-Medical): No  Physical Activity: Sufficiently Active   Days of Exercise per Week: 5 days   Minutes of Exercise per Session: 30 min  Stress: No Stress Concern Present   Feeling of Stress : Not at all  Social Connections: Moderately Integrated   Frequency of Communication with Friends and Family: More than three times a week   Frequency of Social Gatherings with Friends and Family: More than three times a week   Attends Religious Services: More than 4 times per year   Active Member of Clubs or Organizations: No   Attends Club or Organization Meetings: More than 4 times per year   Marital Status: Divorced     Family History: The patient's family history includes Colon cancer (age of onset: 65)  in his father; Diabetes in his father and mother; Heart   attack in his brother; Heart disease in his father; Renal cancer in his father; Stroke in his mother; Sudden death in his brother. There is no history of Esophageal cancer, Rectal cancer, or Stomach cancer.  ROS:   Please see the history of present illness.     All other systems reviewed and are negative.  EKGs/Labs/Other Studies Reviewed:    The following studies were reviewed today:  Cath 05/06/2017 There is moderate left ventricular systolic dysfunction. LV end diastolic pressure is severely elevated. The left ventricular ejection fraction is 35-45% by visual estimate. Lat Ramus lesion, 50 %stenosed. Ramus lesion, 35 %stenosed. Mid RCA lesion, 25 %stenosed. Mid RCA to Dist RCA lesion, 0 %stenosed.  Echo 02/15/2021  1. 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 The EF drop is  new. The wall motion abnormalities are new. Findings could represent  stress induced cardiomyopathy vs LAD infarction, clinical correlation is  recommended. Left ventricular  ejection fraction, by estimation, is 30 to 35%. The left ventricle has  moderately decreased function. The left ventricle demonstrates regional  wall motion abnormalities (see scoring diagram/findings for description).  The left ventricular internal cavity  size was severely dilated. Left ventricular diastolic function could not  be evaluated.   2. Right ventricular systolic function is normal. The right ventricular  size is normal. Tricuspid regurgitation signal is inadequate for assessing  PA pressure.   3. The mitral valve has been repaired/replaced. No evidence of mitral  valve regurgitation. No evidence of mitral stenosis. There is a 30 mm  prosthetic annuloplasty ring present in the mitral position. Procedure  Date: 09/27/2012. Echo findings are  consistent with normal structure and function of the mitral valve  prosthesis.   4. The aortic valve is tricuspid.  Aortic valve regurgitation is trivial.  No aortic stenosis is present.   Comparison(s): Changes from prior study are noted. EF is now reduced. New  WMA described above.   EKG:  EKG is not ordered today.   Recent Labs: 02/27/2021: ALT 22; Hemoglobin 14.5; Platelets 159.0 03/28/2021: BUN 21; Creatinine, Ser 1.28; Potassium 4.0; Sodium 140  Recent Lipid Panel    Component Value Date/Time   CHOL 125 02/15/2021 0458   CHOL 124 04/17/2019 0753   TRIG 303 (H) 02/15/2021 0458   HDL 30 (L) 02/15/2021 0458   HDL 33 (L) 04/17/2019 0753   CHOLHDL 4.2 02/15/2021 0458   VLDL 61 (H) 02/15/2021 0458   LDLCALC 34 02/15/2021 0458   LDLCALC 52 04/17/2019 0753   LDLDIRECT 52.0 07/18/2019 1454     Risk Assessment/Calculations:    CHA2DS2-VASc Score = 5   This indicates a 7.2% annual risk of stroke. The patient's score is based upon: CHF History: 1 HTN History: 1 Diabetes History: 1 Stroke History: 0 Vascular Disease History: 1 Age Score: 1 Gender Score: 0          Physical Exam:    VS:  BP 134/72   Pulse 72   Ht 5' 10" (1.778 m)   Wt 238 lb (108 kg)   SpO2 97%   BMI 34.15 kg/m     Wt Readings from Last 3 Encounters:  04/01/21 238 lb (108 kg)  03/13/21 234 lb 6.4 oz (106.3 kg)  02/27/21 230 lb 3.2 oz (104.4 kg)     GEN:  Well nourished, well developed in no acute distress HEENT: Normal NECK: No JVD; No carotid bruits LYMPHATICS: No lymphadenopathy CARDIAC: RRR, no murmurs, rubs, gallops RESPIRATORY:  Clear to auscultation   without rales, wheezing or rhonchi  ABDOMEN: Soft, non-tender, non-distended MUSCULOSKELETAL:  No edema; No deformity  SKIN: Warm and dry NEUROLOGIC:  Alert and oriented x 3 PSYCHIATRIC:  Normal affect   ASSESSMENT:    1. Chronic systolic CHF (congestive heart failure) (HCC)   2. Coronary artery disease involving native coronary artery of native heart without angina pectoris   3. History of repair of mitral valve   4. ICD (implantable  cardioverter-defibrillator) in place   5. Essential hypertension   6. Hyperlipidemia LDL goal <70    PLAN:    In order of problems listed above:  Chronic systolic heart failure: Patient has a history of nonischemic cardiomyopathy in 2018, cardiac catheterization at the time showed mild disease.  Ejection fraction later normalized.  Recently, patient was admitted to the hospital with acute CVA and noted to have a drop in the ejection fraction down to 35%.  He was not considered cath candidate given the recent CVA.  He was placed on Plavix for the stroke.  We will continue to uptitrate heart failure medication.  Add Farxiga.  Uptitrate Entresto to 93-103 mg twice a day.  Plan for repeat echocardiogram prior to the next office visit.  CAD: Mild CAD noted on previous cardiac catheterization in November 2018.  Denies any recent chest pain  Mitral valve repair: Stable on last echocardiogram  History of ICD: Followed by EP service  Hypertension: Blood pressure stable  Hyperlipidemia: On Lipitor  History of CVA: Continue Plavix monotherapy.      Medication Adjustments/Labs and Tests Ordered: Current medicines are reviewed at length with the patient today.  Concerns regarding medicines are outlined above.  Orders Placed This Encounter  Procedures   Basic metabolic panel   ECHOCARDIOGRAM COMPLETE   Meds ordered this encounter  Medications   DISCONTD: sacubitril-valsartan (ENTRESTO) 97-103 MG    Sig: Take 1 tablet by mouth 2 (two) times daily.    Dispense:  180 tablet    Refill:  3   DISCONTD: sacubitril-valsartan (ENTRESTO) 97-103 MG    Sig: Take 1 tablet by mouth 2 (two) times daily.    Dispense:  180 tablet    Refill:  3    Patient Instructions  Medication Instructions:  INCREASE Entresto to 97 mg-103 mg 2 times a day   *If you need a refill on your cardiac medications before your next appointment, please call your pharmacy*  Lab Work: Your physician recommends that you  return for lab work in 2 weeks:  BMET  If you have labs (blood work) drawn today and your tests are completely normal, you will receive your results only by: MyChart Message (if you have MyChart) OR A paper copy in the mail If you have any lab test that is abnormal or we need to change your treatment, we will call you to review the results.  Testing/Procedures: Your physician has requested that you have an echocardiogram. Echocardiography is a painless test that uses sound waves to create images of your heart. It provides your doctor with information about the size and shape of your heart and how well your heart's chambers and valves are working. This procedure takes approximately one hour. There are no restrictions for this procedure.  Please schedule for end of November or early December   Follow-Up: At [facility], you and your health needs are our priority.  As part of our continuing mission to provide you with exceptional heart care, we have created designated Provider Care Teams.  These   Care Teams include your primary Cardiologist (physician) and Advanced Practice Providers (APPs -  Physician Assistants and Nurse Practitioners) who all work together to provide you with the care you need, when you need it.   Your next appointment:   As scheduled   The format for your next appointment:   In Person  Provider:   K. Chad Hilty, MD  Other Instructions    Signed, Hao Meng, PA  04/03/2021 11:41 PM    [facility]  

## 2021-04-03 ENCOUNTER — Other Ambulatory Visit: Payer: Self-pay

## 2021-04-03 ENCOUNTER — Encounter: Payer: Self-pay | Admitting: Physician Assistant

## 2021-04-03 MED ORDER — SACUBITRIL-VALSARTAN 97-103 MG PO TABS: 1.0000 | ORAL_TABLET | Freq: Two times a day (BID) | ORAL | 3 refills | Status: DC

## 2021-04-04 DIAGNOSIS — E1169 Type 2 diabetes mellitus with other specified complication: Secondary | ICD-10-CM

## 2021-04-04 DIAGNOSIS — I10 Essential (primary) hypertension: Secondary | ICD-10-CM | POA: Diagnosis not present

## 2021-04-04 DIAGNOSIS — I2510 Atherosclerotic heart disease of native coronary artery without angina pectoris: Secondary | ICD-10-CM | POA: Diagnosis not present

## 2021-04-04 DIAGNOSIS — I480 Paroxysmal atrial fibrillation: Secondary | ICD-10-CM | POA: Diagnosis not present

## 2021-04-04 DIAGNOSIS — E785 Hyperlipidemia, unspecified: Secondary | ICD-10-CM

## 2021-04-04 DIAGNOSIS — I5022 Chronic systolic (congestive) heart failure: Secondary | ICD-10-CM | POA: Diagnosis not present

## 2021-04-04 DIAGNOSIS — E118 Type 2 diabetes mellitus with unspecified complications: Secondary | ICD-10-CM

## 2021-04-08 ENCOUNTER — Other Ambulatory Visit: Payer: Self-pay | Admitting: Internal Medicine

## 2021-04-09 ENCOUNTER — Ambulatory Visit: Payer: Medicare Other | Admitting: Adult Health

## 2021-04-09 ENCOUNTER — Encounter: Payer: Self-pay | Admitting: Adult Health

## 2021-04-09 ENCOUNTER — Encounter: Payer: Self-pay | Admitting: Internal Medicine

## 2021-04-09 VITALS — BP 112/78 | HR 75 | Ht 70.0 in | Wt 235.0 lb

## 2021-04-09 DIAGNOSIS — E785 Hyperlipidemia, unspecified: Secondary | ICD-10-CM | POA: Diagnosis not present

## 2021-04-09 DIAGNOSIS — E1165 Type 2 diabetes mellitus with hyperglycemia: Secondary | ICD-10-CM

## 2021-04-09 DIAGNOSIS — G4733 Obstructive sleep apnea (adult) (pediatric): Secondary | ICD-10-CM

## 2021-04-09 DIAGNOSIS — Z9989 Dependence on other enabling machines and devices: Secondary | ICD-10-CM | POA: Diagnosis not present

## 2021-04-09 DIAGNOSIS — I10 Essential (primary) hypertension: Secondary | ICD-10-CM | POA: Diagnosis not present

## 2021-04-09 DIAGNOSIS — I639 Cerebral infarction, unspecified: Secondary | ICD-10-CM

## 2021-04-09 NOTE — Progress Notes (Signed)
[name] [address] [place]. [place] [address] [phone]       HOSPITAL FOLLOW UP NOTE  [name] Date of Birth:  07-07-1950 Medical Record Number:  [id number]   Reason for Referral:  hospital stroke follow up    SUBJECTIVE:   CHIEF COMPLAINT:  Chief Complaint  Patient presents with   Follow-up    Rm 2 alone here for hospital follow up: reports since d/c he has been doing well reports his vision has improved since being d/c    HPI:   [name] is a 70 y.o. male with history of CAD status post DES to RCA 2014, paroxysmal A. fib not on anticoagulation due to a history of thigh hematoma, V. fib arrest status post ICD, obstructive sleep apnea on CPAP, GERD, hypertension, BPH, diabetes type 2, remote history of infective endocarditis complicated by mitral regurgitation s/p repair who presented on 02/14/2021 complaining of vertigo and diplopia.  Personally reviewed hospitalization pertinent progress notes, lab work and imaging.  Evaluated by Dr. 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  CTA head/neck negative LVO.  EF 30 to 35% (prior EF 55 to 60% 01/2020) with anterior apical and inferior apical wall motion abnormality suggestive of Takotsubo vs LAD territory wall motion abnormality.  Evaluated by cardiology  - started on Entresto and ACE inhibitor washout.  LDL 34.  A1c 11.5.  Recommended DAPT for 3 weeks and Plavix alone.  No prior stroke history.  Evaluated by therapies recommended home health PT/OT and discharged home in stable condition.  Today, 04/24/2021, Christopher [name] is being seen for hospital follow-up unaccompanied.  Overall doing well from stroke standpoint.  Denies residual diplopia or vertigo.  Denies new stroke/TIA symptoms.  Completed 3 weeks DAPT -remains on Plavix alone as well as atorvastatin without side effects.  Blood pressure today 112/78. Glucose levels monitored which have been good - this AM  reading 105.  Reports nightly use of CPAP for OSA management.  Has had follow-up with cardiology with medication adjustments and plans on repeat echo end of November.  No concerns at this time.     PERTINENT IMAGING  MR BRAIN 02/17/2021 IMPRESSION: Small acute infarct at the junction of the pons and midbrain on the left, which could potentially account for reported visual symptoms  CTA HEAD/NECK 02/14/2021 IMPRESSION: CT HEAD IMPRESSION: 1. No acute intracranial abnormality. 2. Mild chronic microvascular ischemic disease with small remote lacunar infarct at the genu of the right internal capsule.   CTA HEAD AND NECK IMPRESSION: Normal CTA of the head and neck. No large vessel occlusion, hemodynamically significant stenosis, or other acute vascular abnormality.  2D ECHO 02/15/2021 IMPRESSIONS   1. 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 The EF drop is  new. The wall motion abnormalities are new. Findings could represent  stress induced cardiomyopathy vs LAD infarction, clinical correlation is  recommended. Left ventricular  ejection fraction, by estimation, is 30 to 35%. The left ventricle has  moderately decreased function. The left ventricle demonstrates regional  wall motion abnormalities (see scoring diagram/findings for description).  The left ventricular internal cavity  size was severely dilated. Left ventricular diastolic function could not  be evaluated.   2. Right ventricular systolic function is normal. The right ventricular  size is normal. Tricuspid regurgitation signal is inadequate for assessing  PA pressure.   3. The mitral valve has been repaired/replaced. No evidence of mitral  valve regurgitation. No  evidence of mitral stenosis. There is a 30 mm  prosthetic annuloplasty ring present in the mitral position. Procedure  Date: 09/27/2012. Echo findings are  consistent with normal structure and function of the mitral valve  prosthesis.   4. The  aortic valve is tricuspid. Aortic valve regurgitation is trivial.  No aortic stenosis is present.      ROS:   14 system review of systems performed and negative with exception of no complaints  PMH:  Past Medical History:  Diagnosis Date   Allergy    Arthritis    Atrial fibrillation ([place])    post op, intol of anticoag   Cardiac arrest ([place])    a. s/p MDT single chamber ICD; 11-10-18- pt denies having a heart attack   Chronic kidney disease    kidney stone   Coronary artery disease    a. 2/7 Cath: LM nl, LAD min irregs, LCX min irregs, RI 40, RCA 78m, EF 55-60% basal to mid inf HK, 3-4+ MR;  b. 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 Mitral valve prolapse with flail segment of posterior leaflet and severe MR by TEE, remote h/o bacterial endocarditis    Sleep apnea    NPSG 01/21/06- AHI 40.7/hr cpap   Subacute bacterial endocarditis 03/22/2008   Strep viridans   Ventricular fibrillation ([place]) 11/07/2019   appropriate shock (36J) for VF delivered    PSH:  Past Surgical History:  Procedure Laterality Date   AMPUTATION  07/28/2012   Procedure: AMPUTATION DIGIT;  Surgeon: Colin Rhein, MD;  Location: [facility];  Service: Orthopedics;  Laterality: Right;  2nd toe   CARDIAC CATHETERIZATION     COLONOSCOPY     EP IMPLANTABLE DEVICE N/A 05/01/2016   Procedure: Loop Recorder Removal;  Surgeon: Thompson Grayer, MD;  Location: [place] [facility];  Service: Cardiovascular;  Laterality: N/A;   FOOT SURGERY     BILATERAL TOES   ICD IMPLANT N/A 05/07/2017    Medtronic Visia AF MRI VR SureScan implanted  by Dr Curt Bears following VF arrest   Implantable loop recorder placement  03/31/13   MDT Linq implanted by Dr Rayann Heman to evaluate for further afib   INTRAOPERATIVE TRANSESOPHAGEAL ECHOCARDIOGRAM N/A 09/27/2012   Procedure: INTRAOPERATIVE TRANSESOPHAGEAL ECHOCARDIOGRAM;  Surgeon: [name], MD;  Location: [place];  Service: Open Heart Surgery;  Laterality: N/A;   LEFT AND RIGHT HEART CATHETERIZATION WITH CORONARY ANGIOGRAM N/A 08/12/2012   Procedure: LEFT AND RIGHT HEART CATHETERIZATION WITH CORONARY ANGIOGRAM;  Surgeon: [name], MD;  [facility];  Service: Cardiovascular;  Laterality: N/A;   LEFT HEART CATH AND CORONARY ANGIOGRAPHY N/A 05/06/2017   Procedure: LEFT HEART CATH AND CORONARY ANGIOGRAPHY;  Surgeon: [name], MD;  Location: [facility];  Service: Cardiovascular;  Laterality: N/A;   LOOP RECORDER IMPLANT N/A 03/31/2013   Procedure: LOOP RECORDER IMPLANT;  Surgeon: [name], MD;  Location: [facility] CATH LAB;  Service: Cardiovascular;  Laterality: N/A;   MITRAL VALVE REPAIR Right 09/27/2012   Procedure: MINIMALLY INVASIVE MITRAL VALVE  REPAIR (MVR);  Surgeon: [name], MD;  Location: [place];  Service: Open Heart Surgery;  Laterality: Right;  Ultrasound guided   PERCUTANEOUS CORONARY STENT INTERVENTION (PCI-S) N/A 08/25/2012   Procedure: PERCUTANEOUS CORONARY STENT INTERVENTION (PCI-S);  Surgeon: Sherren Mocha, MD;  Location: [facility] CATH LAB;  Service: Cardiovascular;  Laterality: N/A;   SEPTOPLASTY     Dr. [name]   TEE WITHOUT CARDIOVERSION N/A 08/12/2012   Procedure: TRANSESOPHAGEAL ECHOCARDIOGRAM (TEE);  Surgeon: [name], MD;  Location: [facility] ENDOSCOPY;  Service: Cardiovascular;  Laterality: N/A;   TONSILLECTOMY     UVULOPALATOPHARYNGOPLASTY      Social History:  Social History   Socioeconomic History   Marital status: Divorced    Spouse name: Not on file   Number of children: Not on file   Years of education: Not on file   Highest education  level: Not on file  Occupational History   Occupation: Works at [employer] Retired  Tobacco Use   Smoking status: Former    Packs/day: 2.50    Years: 5.00    Pack years: 12.50    Types: Cigarettes    Quit date: 07/06/1978    Years since quitting: 42.7   Smokeless tobacco: Never   Tobacco comments:    social drinker  Vaping Use   Vaping Use: Never used  Substance and Sexual Activity   Alcohol use: Yes    Comment: OCC.   Drug use: No   Sexual activity: Yes  Other Topics Concern   Not on file  Social History Narrative   Works at [employer] - plans to retire 07/2015 after 36 years.   Divorced, lives alone   Supportive g-friend (shirlee moore)         Social Determinants of Health   Financial Resource Strain: Low Risk    Difficulty of Paying Living Expenses: Not hard at all  Food Insecurity: No Food Insecurity   Worried About Charity fundraiser in the Last Year: Never true   Arboriculturist in the Last Year: Never true  Transportation Needs: No Transportation Needs   Lack of Transportation (Medical): No   Lack of Transportation (Non-Medical): No  Physical Activity: Sufficiently Active   Days of Exercise per Week: 5 days   Minutes of Exercise per Session: 30 min  Stress: No Stress Concern Present   Feeling of Stress : Not at all  Social Connections: Moderately Integrated   Frequency of Communication with Friends and Family: More than three times a week   Frequency of Social Gatherings with Friends and Family: More than three times a week   Attends Religious Services: More than 4 times per year   Active Member of [employer] or Organizations: No   Attends Music therapist: More than 4 times per year   Marital Status: Divorced  Human resources officer Violence: Not At Risk   Fear of Current or Ex-Partner: No   Emotionally Abused: No   Physically Abused: No   Sexually Abused: No    Family History:  Family History  Problem Relation Age of Onset   Heart disease Father     Colon cancer Father 37   Diabetes Father    Renal cancer Father    Diabetes Mother    Stroke Mother    Sudden death Brother    Heart attack Brother    Esophageal cancer Neg Hx    Rectal cancer Neg Hx    Stomach cancer Neg Hx  Medications:   Current Outpatient Medications on File Prior to Visit  Medication Sig Dispense Refill   acetaminophen (TYLENOL) 500 MG tablet Take 500 mg by mouth every 6 (six) hours as needed for mild pain or headache.      atorvastatin (LIPITOR) 40 MG tablet TAKE 1 TABLET BY MOUTH  DAILY 90 tablet 3   blood glucose meter kit and supplies KIT Dispense based on patient and insurance preference. Use up to four times daily as directed. 1 each 0   cetirizine (ZYRTEC) 10 MG tablet Take 10 mg by mouth daily.     cholecalciferol (VITAMIN D) 25 MCG (1000 UNIT) tablet Take 1 tablet by mouth daily.      clopidogrel (PLAVIX) 75 MG tablet Take 1 tablet (75 mg total) by mouth daily. 90 tablet 3   dapagliflozin propanediol (FARXIGA) 10 MG TABS tablet Take 1 tablet (10 mg total) by mouth daily before breakfast. 30 tablet 11   fenofibrate (TRICOR) 145 MG tablet TAKE 1 TABLET BY MOUTH  DAILY 90 tablet 3   fluticasone (FLONASE) 50 MCG/ACT nasal spray USE 2 SPRAYS IN BOTH  NOSTRILS DAILY 48 g 1   Insulin Lispro Prot & Lispro (HUMALOG MIX 75/25 KWIKPEN) (75-25) 100 UNIT/ML Kwikpen Inject 22 Units into the skin in the morning and at bedtime. 15 mL 11   metFORMIN (GLUCOPHAGE) 1000 MG tablet TAKE 1 TABLET BY MOUTH  TWICE DAILY WITH A MEAL 180 tablet 3   metoprolol succinate (TOPROL-XL) 100 MG 24 hr tablet TAKE 1 TABLET BY MOUTH  DAILY OR IMMEDIATELY  FOLLOWING A MEAL (Patient taking differently: 100 mg.) 90 tablet 3   sacubitril-valsartan (ENTRESTO) 97-103 MG Take 1 tablet by mouth 2 (two) times daily. 180 tablet 3   spironolactone (ALDACTONE) 25 MG tablet Take 0.5 tablets (12.5 mg total) by mouth daily. 30 tablet 3   tamsulosin (FLOMAX) 0.4 MG CAPS capsule TAKE 1 CAPSULE BY MOUTH   DAILY 90 capsule 3   No current facility-administered medications on file prior to visit.    Allergies:   Allergies  Allergen Reactions   Codeine Other (See Comments)    Causes bad constipation   Dust Mite Extract Cough   Pollen Extract Cough      OBJECTIVE:  Physical Exam  Vitals:   04/09/21 1414  BP: 112/78  Pulse: 75  SpO2: 98%  Weight: 235 lb (106.6 kg)  Height: $Remove'5\' 10"'RCkyEcQ$  (1.778 m)   Body mass index is 33.72 kg/m. No results found.  Post stroke PHQ 2/9 Depression screen PHQ 2/9 04/09/2021  Decreased Interest 0  Down, Depressed, Hopeless 0  PHQ - 2 Score 0  Some recent data might be hidden     General: well developed, well nourished, very pleasant elderly Caucasian male, seated, in no evident distress Head: head normocephalic and atraumatic.   Neck: supple with no carotid or supraclavicular bruits Cardiovascular: regular rate and rhythm, no murmurs Musculoskeletal: no deformity Skin:  no rash/petichiae Vascular:  Normal pulses all extremities   Neurologic Exam Mental Status: Awake and fully alert.  Fluent speech and language.  Oriented to place and time. Recent and remote memory intact. Attention span, concentration and fund of knowledge appropriate. Mood and affect appropriate.  Cranial Nerves: Fundoscopic exam reveals sharp disc margins. Pupils equal, briskly reactive to light. Extraocular movements full without nystagmus. Visual fields full to confrontation. Hearing intact. Facial sensation intact. Face, tongue, palate moves normally and symmetrically.  Motor: Normal bulk and tone. Normal strength in all tested extremity  muscles Sensory.: intact to touch , pinprick , position and vibratory sensation.  Coordination: Rapid alternating movements normal in all extremities. Finger-to-nose and heel-to-shin performed accurately bilaterally. Gait and Station: Arises from chair without difficulty. Stance is normal. Gait demonstrates normal stride length and balance  without use of assistive device. Tandem walk and heel toe mild difficulty.  Reflexes: 1+ and symmetric. Toes downgoing.     NIHSS  0 Modified Rankin  0      ASSESSMENT: Christopher [name] is a 70 y.o. 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 Vascular risk factors include HTN, HLD, DM, postop AFib, CAD s/p PCI to RCA 2014, SBE, severe MR s/p repair 2014, cardiac arrest s/p Medtronic single-chamber ICD 2018, NICM, CHF and OSA on CPAP.      PLAN:  L pons/midbrain stroke :  Recovered well without residual deficit.   Continue clopidogrel 75 mg daily  and atorvastatin 40 mg daily for secondary stroke prevention.   Discussed secondary stroke prevention measures and importance of close PCP follow up for aggressive stroke risk factor management. I have gone over the pathophysiology of stroke, warning signs and symptoms, risk factors and their management in some detail with instructions to go to the closest emergency room for symptoms of concern. HTN: BP goal <130/90.  Stable on current regimen per PCP HLD: LDL goal <70. Recent LDL 34 on atorvastatin 40 mg daily.  DMII: A1c goal<7.0. Recent A1c 11.5.  Has remained on metformin as well as Humalog and Farxiga with great improvement of glucose levels.  Plans on repeat A1c with PCP next month CHF: Routinely followed by cardiology with plans on repeat echo next month OSA on CPAP: Continue nightly use for secondary stroke prevention and cardiovascular comorbidities    Follow up in 6 months or call earlier if needed   CC:  [place] provider: Dr. [name] PCP: Hoyt Koch, MD    I spent 53 minutes of face-to-face and non-face-to-face time with patient.  This included previsit chart review including review of recent hospitalization, lab review, study review, electronic health record documentation, patient education regarding recent stroke including  etiology, secondary stroke prevention measures and importance of managing stroke risk factors, brief discussion regarding cardiac comorbidities and answered all other questions to patient satisfaction  Frann Rider, AGNP-BC  [facility] [address] [place], [place] [address]  Phone [phone] Fax [phone] Note: This document was prepared with digital dictation and possible smart phrase technology. Any transcriptional errors that result from this process are unintentional.

## 2021-04-18 DIAGNOSIS — I5022 Chronic systolic (congestive) heart failure: Secondary | ICD-10-CM | POA: Diagnosis not present

## 2021-04-19 LAB — BASIC METABOLIC PANEL
BUN/Creatinine Ratio: 15 (ref 10–24)
BUN: 19 mg/dL (ref 8–27)
CO2: 24 mmol/L (ref 20–29)
Calcium: 9.5 mg/dL (ref 8.6–10.2)
Chloride: 105 mmol/L (ref 96–106)
Creatinine, Ser: 1.25 mg/dL (ref 0.76–1.27)
Glucose: 99 mg/dL (ref 70–99)
Potassium: 3.9 mmol/L (ref 3.5–5.2)
Sodium: 144 mmol/L (ref 134–144)
eGFR: 62 mL/min/{1.73_m2} (ref >59)

## 2021-04-21 DIAGNOSIS — E119 Type 2 diabetes mellitus without complications: Secondary | ICD-10-CM | POA: Diagnosis not present

## 2021-04-21 LAB — HM DIABETES EYE EXAM

## 2021-04-23 ENCOUNTER — Encounter: Payer: Self-pay | Admitting: Internal Medicine

## 2021-04-24 ENCOUNTER — Telehealth: Payer: Self-pay

## 2021-04-24 NOTE — Progress Notes (Signed)
I agree with the above plan 

## 2021-04-24 NOTE — Progress Notes (Signed)
Chronic Care Management Pharmacy Assistant   Name: Christopher [name]  MRN: [id number] DOB: 29-Jun-1951   Reason for Encounter: Disease State   Conditions to be addressed/monitored: DMII   Recent office visits:  None ID  Recent consult visits:  04/09/21 Ihor Austin, NP-Neurology (Ischemic stroke) no med changes 04/01/21 Azalee Course, PA-Cardiology (Chronic systolic CHF) med changes: INCREASE Entresto to 97 mg-103 mg 2 times a day   Hospital visits:  None since last coordination call  Medications: Outpatient Encounter Medications as of 04/24/2021  Medication Sig Note   acetaminophen (TYLENOL) 500 MG tablet Take 500 mg by mouth every 6 (six) hours as needed for mild pain or headache.     atorvastatin (LIPITOR) 40 MG tablet TAKE 1 TABLET BY MOUTH  DAILY    blood glucose meter kit and supplies KIT Dispense based on patient and insurance preference. Use up to four times daily as directed.    cetirizine (ZYRTEC) 10 MG tablet Take 10 mg by mouth daily.    cholecalciferol (VITAMIN D) 25 MCG (1000 UNIT) tablet Take 1 tablet by mouth daily.     clopidogrel (PLAVIX) 75 MG tablet Take 1 tablet (75 mg total) by mouth daily.    dapagliflozin propanediol (FARXIGA) 10 MG TABS tablet Take 1 tablet (10 mg total) by mouth daily before breakfast. 03/31/2021: [employer] grant thru 02/28/2022   fenofibrate (TRICOR) 145 MG tablet TAKE 1 TABLET BY MOUTH  DAILY    fluticasone (FLONASE) 50 MCG/ACT nasal spray USE 2 SPRAYS IN BOTH  NOSTRILS DAILY    Insulin Lispro Prot & Lispro (HUMALOG MIX 75/25 KWIKPEN) (75-25) 100 UNIT/ML Kwikpen Inject 22 Units into the skin in the morning and at bedtime.    metFORMIN (GLUCOPHAGE) 1000 MG tablet TAKE 1 TABLET BY MOUTH  TWICE DAILY WITH A MEAL    metoprolol succinate (TOPROL-XL) 100 MG 24 hr tablet TAKE 1 TABLET BY MOUTH  DAILY OR IMMEDIATELY  FOLLOWING A MEAL (Patient taking differently: 100 mg.)    sacubitril-valsartan (ENTRESTO) 97-103 MG Take 1 tablet by mouth 2 (two)  times daily.    spironolactone (ALDACTONE) 25 MG tablet Take 0.5 tablets (12.5 mg total) by mouth daily.    tamsulosin (FLOMAX) 0.4 MG CAPS capsule TAKE 1 CAPSULE BY MOUTH  DAILY    No facility-administered encounter medications on file as of 04/24/2021.   Recent Relevant Labs: Lab Results  Component Value Date/Time   HGBA1C 11.5 (H) 02/15/2021 04:58 AM   HGBA1C 6.8 (A) 09/16/2020 01:42 PM   HGBA1C 6.3 05/16/2020 03:53 PM   MICROALBUR 1.8 07/18/2019 03:00 PM    Kidney Function Lab Results  Component Value Date/Time   CREATININE 1.25 04/18/2021 03:42 PM   CREATININE 1.28 (H) 03/28/2021 10:56 AM   CREATININE 1.07 05/03/2013 09:59 AM   GFR 56.84 (L) 02/27/2021 09:23 AM   GFRNONAA >60 02/18/2021 01:00 AM   GFRAA >60 03/31/2020 02:55 PM    Current antihyperglycemic regimen:  Metformin 1000 mg BID Testing supplies (Accu-Chek) Humalog 75/25 mix - 22 units BID Farxiga 10 mg 1 tab daily What recent interventions/DTPs have been made to improve glycemic control:  None noted  Have there been any recent hospitalizations or ED visits since last visit with CPP? No, not since last call  Patient denies hypoglycemic symptoms, including None Patient denies hyperglycemic symptoms, including none  How often are you checking your blood sugar? twice daily, in the morning and night  What are your blood sugars ranging? Patient states blood sugar ranges  between 100-134 Fasting: 118 am Bedtime: 130 last night  During the week, how often does your blood glucose drop below 70? Never  Are you checking your feet daily/regularly? Patient states he has not issues with feet  Adherence Review: Is the patient currently on a STATIN medication? Yes Is the patient currently on ACE/ARB medication? Yes Does the patient have >5 day gap between last estimated fill dates? No   Care Gaps: Colonoscopy-11/10/18 Diabetic Foot Exam-01/16/20 Mammogram-NA Ophthalmology-04/21/21 Dexa Scan -NA  Annual Well Visit  -  Micro albumin-NA Hemoglobin A1c- 02/15/21  Star Rating Drugs: Atorvastatin 40 mg last fill 04/09/21 Entresto 97-103 mg last fill 04/03/21 Farxiga 10 mg last fill 04/12/21 Metformin 1000 mg last fill 01/14/21   Ethelene Hal Clinical Pharmacist Assistant [phone]

## 2021-04-28 ENCOUNTER — Ambulatory Visit (INDEPENDENT_AMBULATORY_CARE_PROVIDER_SITE_OTHER): Payer: Medicare Other

## 2021-04-28 DIAGNOSIS — Z95810 Presence of automatic (implantable) cardiac defibrillator: Secondary | ICD-10-CM

## 2021-04-28 DIAGNOSIS — I5022 Chronic systolic (congestive) heart failure: Secondary | ICD-10-CM

## 2021-04-30 ENCOUNTER — Telehealth: Payer: Self-pay

## 2021-04-30 NOTE — Progress Notes (Signed)
EPIC Encounter for ICM Monitoring  Patient Name: Christopher [name] is a 70 y.o. male Date: 04/30/2021 Primary Care Physican: Hoyt Koch, MD Primary Cardiologist: [facility] Electrophysiologist: [facility] 03/21/2021 Weight: 229 lbs                             Attempted call to patient and unable to reach. Transmission reviewed.    Optivol thoracic impedance suggesting fluid levels have returned to baseline. Fluid index remains returned to normal threshold.   Spironolactone 25 mg take 0.5 tablet (12.5 total) daily   Labs: 04/18/2021 Creatinine 1.25, BUN 19, Potassium 3.9, Sodium 144, GFR 62 03/28/2021 Creatinine 1.28, BUN 21, Potassium 4.0, Sodium 140, GFR 60  02/18/2021 Creatinine 1.04, BUN 13, Potassium 3.4, Sodium 137, GFR >60 02/17/2021 Creatinine 0.95, BUN 14, Potassium 3.2, Sodium 136, GFR >60  02/16/2021 Creatinine 1.09, BUN 16, Potassium 3.9, Sodium 135, GFR >60  02/15/2021 Creatinine 0.90, BUN 11, Potassium 3.5, Sodium 134, GFR >60  02/14/2021 Creatinine 0.94, BUN 11, Potassium 3.4, Sodium 133, GFR >60  A complete set of results can be found in Results Review.   Recommendations: Unable to reach.     Follow-up plan: ICM clinic phone appointment on 06/09/2021.   91 day device clinic remote transmission 05/19/2021.      EP/Cardiology Office Visits:  07/02/2021 with Dr Debara Pickett.  Recall 12/28/2020 with Dr Rayann Heman.     Copy of ICM check sent to Dr. Rayann Heman.    3 month ICM trend: 04/28/2021.    1 Year ICM trend:       Rosalene Billings, RN 04/30/2021 2:38 PM

## 2021-04-30 NOTE — Telephone Encounter (Signed)
Remote ICM transmission received.  Attempted call to patient regarding ICM remote transmission and line busy.

## 2021-05-19 ENCOUNTER — Ambulatory Visit (INDEPENDENT_AMBULATORY_CARE_PROVIDER_SITE_OTHER): Payer: Medicare Other

## 2021-05-19 DIAGNOSIS — Z95810 Presence of automatic (implantable) cardiac defibrillator: Secondary | ICD-10-CM | POA: Diagnosis not present

## 2021-05-20 ENCOUNTER — Other Ambulatory Visit: Payer: Self-pay

## 2021-05-20 ENCOUNTER — Ambulatory Visit (INDEPENDENT_AMBULATORY_CARE_PROVIDER_SITE_OTHER): Payer: Medicare Other | Admitting: Internal Medicine

## 2021-05-20 ENCOUNTER — Encounter: Payer: Self-pay | Admitting: Internal Medicine

## 2021-05-20 VITALS — BP 130/70 | HR 58 | Resp 18 | Ht 70.0 in | Wt 235.8 lb

## 2021-05-20 DIAGNOSIS — E118 Type 2 diabetes mellitus with unspecified complications: Secondary | ICD-10-CM

## 2021-05-20 DIAGNOSIS — Z23 Encounter for immunization: Secondary | ICD-10-CM

## 2021-05-20 LAB — CUP PACEART REMOTE DEVICE CHECK
Battery Remaining Longevity: 88 mo
Battery Voltage: 2.99 V
Brady Statistic RV Percent Paced: 0.11 %
Date Time Interrogation Session: 20221115074227
HighPow Impedance: 71 Ohm
Implantable Lead Implant Date: 20181102
Implantable Lead Location: 753860
Implantable Pulse Generator Implant Date: 20181102
Lead Channel Impedance Value: 418 Ohm
Lead Channel Impedance Value: 513 Ohm
Lead Channel Pacing Threshold Amplitude: 0.625 V
Lead Channel Pacing Threshold Pulse Width: 0.4 ms
Lead Channel Sensing Intrinsic Amplitude: 5.5 mV
Lead Channel Sensing Intrinsic Amplitude: 5.5 mV
Lead Channel Setting Pacing Amplitude: 2 V
Lead Channel Setting Pacing Pulse Width: 0.4 ms
Lead Channel Setting Sensing Sensitivity: 0.3 mV

## 2021-05-20 LAB — POCT GLYCOSYLATED HEMOGLOBIN (HGB A1C): Hemoglobin A1C: 5.6 % (ref 4.0–5.6)

## 2021-05-20 NOTE — Patient Instructions (Signed)
We will have you cut back to 20 units twice a day for the insulin.

## 2021-05-20 NOTE — Progress Notes (Signed)
   Subjective:   Patient ID: Christopher [name], male    DOB: [birth year], 70 y.o.   MRN: [id number]  HPI The patient is a 70 YO man coming in for follow up.   Review of Systems  Constitutional: Negative.   HENT: Negative.    Eyes: Negative.   Respiratory:  Negative for cough, chest tightness and shortness of breath.   Cardiovascular:  Negative for chest pain, palpitations and leg swelling.  Gastrointestinal:  Negative for abdominal distention, abdominal pain, constipation, diarrhea, nausea and vomiting.  Musculoskeletal: Negative.   Skin: Negative.   Neurological: Negative.   Psychiatric/Behavioral: Negative.     Objective:  Physical Exam Constitutional:      Appearance: He is well-developed.  HENT:     Head: Normocephalic and atraumatic.  Cardiovascular:     Rate and Rhythm: Normal rate and regular rhythm.  Pulmonary:     Effort: Pulmonary effort is normal. No respiratory distress.     Breath sounds: Normal breath sounds. No wheezing or rales.  Abdominal:     General: Bowel sounds are normal. There is no distension.     Palpations: Abdomen is soft.     Tenderness: There is no abdominal tenderness. There is no rebound.  Musculoskeletal:     Cervical back: Normal range of motion.  Skin:    General: Skin is warm and dry.  Neurological:     Mental Status: He is alert and oriented to person, place, and time.     Coordination: Coordination normal.    Vitals:   05/20/21 1338  BP: 130/70  Pulse: (!) 58  Resp: 18  SpO2: 96%  Weight: 235 lb 12.8 oz (107 kg)  Height: 5\' 10"  (1.778 m)    This visit occurred during the SARS-CoV-2 public health emergency.  Safety protocols were in place, including screening questions prior to the visit, additional usage of staff PPE, and extensive cleaning of exam room while observing appropriate contact time as indicated for disinfecting solutions.   Assessment & Plan:  Flu shot given at visit

## 2021-05-24 NOTE — Assessment & Plan Note (Addendum)
HgA1c 5.6 today done in office which is improved significantly. No low sugar readings. We will continue farxiga 10 mg daily and insulin 75/25 decrease to 20 units BID and keep metformin 1000 mg BID. Follow up 6 months.

## 2021-05-26 ENCOUNTER — Ambulatory Visit (HOSPITAL_COMMUNITY): Payer: Medicare Other | Attending: Cardiovascular Disease

## 2021-05-26 ENCOUNTER — Other Ambulatory Visit: Payer: Self-pay

## 2021-05-26 DIAGNOSIS — I5022 Chronic systolic (congestive) heart failure: Secondary | ICD-10-CM | POA: Diagnosis not present

## 2021-05-26 DIAGNOSIS — Z006 Encounter for examination for normal comparison and control in clinical research program: Secondary | ICD-10-CM

## 2021-05-26 LAB — ECHOCARDIOGRAM COMPLETE
Area-P 1/2: 2.73 cm2
MV VTI: 1.45 cm2
P 1/2 time: 387 msec
S' Lateral: 4.7 cm

## 2021-05-27 NOTE — Progress Notes (Signed)
Remote ICD transmission.   

## 2021-06-09 ENCOUNTER — Ambulatory Visit (INDEPENDENT_AMBULATORY_CARE_PROVIDER_SITE_OTHER): Payer: Medicare Other

## 2021-06-09 DIAGNOSIS — Z95810 Presence of automatic (implantable) cardiac defibrillator: Secondary | ICD-10-CM

## 2021-06-09 DIAGNOSIS — I5022 Chronic systolic (congestive) heart failure: Secondary | ICD-10-CM

## 2021-06-11 NOTE — Progress Notes (Signed)
EPIC Encounter for ICM Monitoring  Patient Name: Christopher [name] is a 70 y.o. male Date: 06/11/2021 Primary Care Physican: Hoyt Koch, MD Primary Cardiologist: [facility] Electrophysiologist: [place] 03/21/2021 Weight: 229 lbs                             Transmission reviewed.    Optivol thoracic impedance suggesting normal fluid levels.   Spironolactone 25 mg take 0.5 tablet (12.5 total) daily   Labs: 04/18/2021 Creatinine 1.25, BUN 19, Potassium 3.9, Sodium 144, GFR 62 03/28/2021 Creatinine 1.28, BUN 21, Potassium 4.0, Sodium 140, GFR 60  02/18/2021 Creatinine 1.04, BUN 13, Potassium 3.4, Sodium 137, GFR >60 02/17/2021 Creatinine 0.95, BUN 14, Potassium 3.2, Sodium 136, GFR >60  02/16/2021 Creatinine 1.09, BUN 16, Potassium 3.9, Sodium 135, GFR >60  02/15/2021 Creatinine 0.90, BUN 11, Potassium 3.5, Sodium 134, GFR >60  02/14/2021 Creatinine 0.94, BUN 11, Potassium 3.4, Sodium 133, GFR >60  A complete set of results can be found in Results Review.   Recommendations:  No changes.   Follow-up plan: ICM clinic phone appointment on 07/14/2021.   91 day device clinic remote transmission 08/18/2021.      EP/Cardiology Office Visits:  07/02/2021 with Dr Debara Pickett.  Recall 12/28/2020 with Dr Rayann Heman.     Copy of ICM check sent to Dr. Rayann Heman.    3 month ICM trend: 06/09/2021.    12-14 Month ICM trend:       Rosalene Billings, RN 06/11/2021 12:51 PM

## 2021-06-13 ENCOUNTER — Other Ambulatory Visit: Payer: Self-pay | Admitting: Internal Medicine

## 2021-06-17 ENCOUNTER — Other Ambulatory Visit: Payer: Self-pay

## 2021-06-17 ENCOUNTER — Ambulatory Visit: Payer: Medicare Other

## 2021-06-17 DIAGNOSIS — I10 Essential (primary) hypertension: Secondary | ICD-10-CM

## 2021-06-17 DIAGNOSIS — I5022 Chronic systolic (congestive) heart failure: Secondary | ICD-10-CM

## 2021-06-17 DIAGNOSIS — E118 Type 2 diabetes mellitus with unspecified complications: Secondary | ICD-10-CM

## 2021-06-17 NOTE — Patient Instructions (Signed)
Visit Information  Following are the goals we discussed today:   Manage My Medications   Timeframe:  Long-Range Goal Priority:  Medium Start Date:    02/24/21                         Expected End Date:   02/24/22                    Follow Up Date March 2023   - call for medicine refill 2 or 3 days before it runs out - call if I am sick and can't take my medicine - keep a list of all the medicines I take; vitamins and herbals too - use a pillbox to sort medicine  -Use [employer] grant for Lisabeth Register refills at [place]   Why is this important?   These steps will help you keep on track with your medicines.  Track and Manage My Blood Sugars - DM   Timeframe:  Long-Range Goal Priority:  High Start Date:      02/24/21                       Expected End Date:  07/05/22                    Follow Up Date 09/2021   - check blood sugar at prescribed times - check blood sugar if I feel it is too high or too low - take the blood sugar meter to all doctor visits -Blood sugar < 70 is too low, if this occurs treat with 1/2 glass juice, regular soda or sugary snack then re-check sugar in 15 minutes    Why is this important?   Checking your blood sugar at home helps to keep it from getting very high or very low.  Writing the results in a diary or log helps the doctor know how to care for you.  Your blood sugar log should have the time, date and the results.  Also, write down the amount of insulin or other medicine that you take.  Other information, like what you ate, exercise done and how you were feeling, will also be helpful.    Plan: Telephone follow up appointment with care management team member scheduled for:  3 months The patient has been provided with contact information for the care management team and has been advised to call with any health related questions or concerns.   Tomasa Blase, PharmD Clinical Pharmacist, [facility]   Please call the care guide  team at [phone] if you need to cancel or reschedule your appointment.   Patient verbalizes understanding of instructions provided today and agrees to view in [place].

## 2021-06-17 NOTE — Progress Notes (Signed)
Chronic Care Management Pharmacy Note  06/17/2021 Name:  Christopher [name] MRN:  [id number] DOB:  [birth year]  Summary: -Patient reports that he has decreased his insulin to 20 units BID and BG remains well controlled - averaging 98-110 in the AM and about the same / slightly higher in the PM  - reports to 1 recent occasion of hypoglycemic symptoms - corrected by juice and candy  -Cardiology with last visit increased entresto to 97-184m BID - denies any issues since increasing - BP in office well controlled, not currently checking at home  -Confirmed that patient remains on clopidogrel 758mdaily - patient confirms he is not taking ASA any longer   Recommendations/Changes made from today's visit: -Recommending no changes to medications at this time - receiving farxiga and entresto through healthwell grant until 02/2022 - will renew when required -Recommended for patient to continue to monitor blood sugars 2 times a day, to reach out should symptoms of hypoglycemia become more frequent so that insulin can be adjusted  Subjective: Christopher BATHGATEs an [age].o. year old male who is a primary patient of CrHoyt KochMD.  The CCM team was consulted for assistance with disease management and care coordination needs.    Engaged with patient by telephone for follow up visit in response to provider referral for pharmacy case management and/or care coordination services.   Consent to Services:  The patient was given information about Chronic Care Management services, agreed to services, and gave verbal consent prior to initiation of services.  Please see initial visit note for detailed documentation.   Patient Care Team: CrHoyt KochMD as PCP - General (Internal Medicine) AlThompson GrayerMD as PCP - Electrophysiology (Cardiology) HiDebara PicketteNadean CorwinMD as PCP - Cardiology (Cardiology) McLarey DresserMD as Attending Physician (Cardiology) OwRexene AlbertsMD (Inactive) as  Attending Physician (Cardiothoracic Surgery) YoDeneise LeverMD as Attending Physician (Pulmonary Disease) CoSherren MochaMD as Attending Physician (Cardiology) MaJuanita CraverMD as Consulting Physician (Gastroenterology) AlThompson GrayerMD (Cardiology) FoCharlton HawsRPJohn C [facility]s Pharmacist (Pharmacist)  Recent office visits: 05/20/2021 - Dr. CrSharlet [place]ina insulin 75/25 decreased to 20 units BID   Recent consult visits: 04/09/2021 - JeFrann RiderP - Neurology - stroke follow up - no changes to medications - follow up in 6 months  04/01/2021 - HaAlmyra DeforestA- Cardiology - entresto increased to 97-10341mID  - repeat echo in November/ December f/u [facility]: Medication Reconciliation was completed by comparing discharge summary, patients EMR and Pharmacy list, and upon discussion with patient.  Admitted to the hospital on 02/14/21 due to Stroke. Discharge date was 02/18/21. [facility] -started Clopidogrel, aspirin x 3 weeks, then clopidogrel alone -started Entresto 24-26 mg BID -started spironolactone 12.5 mg daily -Started Novolog 70/30 22 units BID (A1c 11.5%)  Medications Discontinued at Hospital Discharge: -Stopped amlodipine, ramipril due to starting Entresto  Medications that remain the same after Hospital Discharge:??  -All other medications will remain the same.     Objective:  Lab Results  Component Value Date   CREATININE 1.25 04/18/2021   BUN 19 04/18/2021   GFR 56.84 (L) 02/27/2021   GFRNONAA >60 02/18/2021   GFRAA >60 03/31/2020   NA 144 04/18/2021   K 3.9 04/18/2021   CALCIUM 9.5 04/18/2021   CO2 24 04/18/2021   GLUCOSE 99 04/18/2021    Lab  Results  Component Value Date/Time   HGBA1C 5.6 05/20/2021 01:50 PM   HGBA1C 11.5 (H) 02/15/2021 04:58 AM   HGBA1C 6.8 (A) 09/16/2020 01:42 PM   HGBA1C 6.3 05/16/2020 03:53 PM   FRUCTOSAMINE 246 01/13/2018 10:29 AM    GFR 56.84 (L) 02/27/2021 09:23 AM   GFR 76.13 05/16/2020 03:53 PM   MICROALBUR 1.8 07/18/2019 03:00 PM    Last diabetic Eye exam:  Lab Results  Component Value Date/Time   HMDIABEYEEXA No Retinopathy 04/21/2021 12:00 AM    Last diabetic Foot exam: No results found for: HMDIABFOOTEX   Lab Results  Component Value Date   CHOL 125 02/15/2021   HDL 30 (L) 02/15/2021   LDLCALC 34 02/15/2021   LDLDIRECT 52.0 07/18/2019   TRIG 303 (H) 02/15/2021   CHOLHDL 4.2 02/15/2021    Hepatic Function Latest Ref Rng & Units 02/27/2021 05/16/2020 07/18/2019  Total Protein 6.0 - 8.3 g/dL 6.9 6.9 6.9  Albumin 3.5 - 5.2 g/dL 4.2 4.3 4.2  AST 0 - 37 U/L _0 ALT 0 - 53 U/L _1 Alk Phosphatase 39 - 117 U/L 44 41 65  Total Bilirubin 0.2 - 1.2 mg/dL 0.8 0.6 0.6  Bilirubin, Direct 0.00 - 0.40 mg/dL - - -    Lab Results  Component Value Date/Time   TSH 3.780 01/03/2020 01:18 PM   TSH 1.966 05/05/2017 04:29 PM   TSH 2.75 11/01/2012 12:37 PM    CBC Latest Ref Rng & Units 02/27/2021 02/16/2021 02/14/2021  WBC 4.0 - 10.5 K/uL 6.1 6.3 5.7  Hemoglobin 13.0 - 17.0 g/dL 14.5 14.4 14.6  Hematocrit 39.0 - 52.0 % 43.1 41.3 42.1  Platelets 150.0 - 400.0 K/uL 159.0 147(L) 168    No results found for: VD25OH  Clinical ASCVD: Yes  The ASCVD Risk score (Arnett DK, et al., 2019) failed to calculate for the following reasons:   The patient has a prior MI or stroke diagnosis    Depression screen [facility] 2/9 04/09/2021 09/05/2020 09/05/2020  Decreased Interest 0 0 0  Down, Depressed, Hopeless 0 0 0  PHQ - 2 Score 0 0 0  Some recent data might be hidden     Social History   Tobacco Use  Smoking Status Former   Packs/day: 2.50   Years: 5.00   Pack years: 12.50   Types: Cigarettes   Quit date: 07/06/1978   Years since quitting: 42.9  Smokeless Tobacco Never  Tobacco Comments   social drinker   BP Readings from Last 3 Encounters:  05/20/21 130/70  04/09/21 112/78  04/01/21 134/72   Pulse Readings  from Last 3 Encounters:  05/20/21 (!) 58  04/09/21 75  04/01/21 72   Wt Readings from Last 3 Encounters:  05/20/21 235 lb 12.8 oz (107 kg)  04/09/21 235 lb (106.6 kg)  04/01/21 238 lb (108 kg)   BMI Readings from Last 3 Encounters:  05/20/21 33.83 kg/m  04/09/21 33.72 kg/m  04/01/21 34.15 kg/m    Assessment/Interventions: Review of patient past medical history, allergies, medications, health status, including review of consultants reports, laboratory and other test data, was performed as part of comprehensive evaluation and provision of chronic care management services.   SDOH:  (Social Determinants of Health) assessments and interventions performed: Yes  SDOH Screenings   Alcohol Screen: Low Risk    Last Alcohol Screening Score (AUDIT): 0  Depression (PHQ2-9): Low Risk    PHQ-2 Score: 0  Financial Resource Strain: Low Risk  Difficulty of Paying Living Expenses: Not hard at all  Food Insecurity: No Food Insecurity   Worried About Charity fundraiser in the Last Year: Never true   Ran Out of Food in the Last Year: Never true  Housing: Low Risk    Last Housing Risk Score: 0  Physical Activity: Sufficiently Active   Days of Exercise per Week: 5 days   Minutes of Exercise per Session: 30 min  Social Connections: Moderately Integrated   Frequency of Communication with Friends and Family: More than three times a week   Frequency of Social Gatherings with Friends and Family: More than three times a week   Attends Religious Services: More than 4 times per year   Active Member of [employer] or Organizations: No   Attends Music therapist: More than 4 times per year   Marital Status: Divorced  Stress: No Stress Concern Present   Feeling of Stress : Not at all  Tobacco Use: Medium Risk   Smoking Tobacco Use: Former   Smokeless Tobacco Use: Never   Passive Exposure: Not on Pensions consultant Needs: No Transportation Needs   Lack of Transportation (Medical): No    Lack of Transportation (Non-Medical): No    CCM Care Plan  Allergies  Allergen Reactions   Codeine Other (See Comments)    Causes bad constipation   Dust Mite Extract Cough   Pollen Extract Cough    Medications Reviewed Today     Reviewed by Hoyt Koch, MD (Physician) on 05/20/21 at 1350  Med List Status: <None>   Medication Order Taking? Sig Documenting Provider Last Dose Status Informant  acetaminophen (TYLENOL) 500 MG tablet [id number] Yes Take 500 mg by mouth every 6 (six) hours as needed for mild pain or headache.  [provider] Taking Active Self  atorvastatin (LIPITOR) 40 MG tablet [id number] Yes TAKE 1 TABLET BY MOUTH  DAILY Hoyt Koch, MD Taking Active   blood glucose meter kit and supplies KIT [id number] Yes Dispense based on patient and insurance preference. Use up to four times daily as directed. Elmarie Shiley, MD Taking Active            Med Note Richardson Landry, Sherrie Sport Mar 13, 2021  8:36 AM)    cetirizine (ZYRTEC) 10 MG tablet [id number] Yes Take 10 mg by mouth daily. [provider] Taking Active Self  cholecalciferol (VITAMIN D) 25 MCG (1000 UNIT) tablet [id number] Yes Take 1 tablet by mouth daily.  [provider] Taking Active Self  clopidogrel (PLAVIX) 75 MG tablet [id number] Yes Take 1 tablet (75 mg total) by mouth daily. Hoyt Koch, MD Taking Active   dapagliflozin propanediol (FARXIGA) 10 MG TABS tablet [id number] Yes Take 1 tablet (10 mg total) by mouth daily before breakfast. Almyra Deforest, Ali Chukson Taking Active            Med Note Luna Glasgow Mar 31, 2021 10:15 AM) [employer] grant thru 02/28/2022  fenofibrate (TRICOR) 145 MG tablet [id number] Yes TAKE 1 TABLET BY MOUTH  DAILY Hoyt Koch, MD Taking Active   fluticasone [facility]) 50 MCG/ACT nasal spray [id number] Yes USE 2 SPRAYS IN BOTH  NOSTRILS DAILY Hoyt Koch, MD Taking Active   Insulin Lispro Prot & Lispro (HUMALOG MIX  75/25 KWIKPEN) (75-25) 100 UNIT/ML Claiborne Rigg [id number] Yes Inject 22 Units into the skin in the morning and at bedtime. Hoyt Koch, MD Taking Active   metFORMIN (GLUCOPHAGE)  1000 MG tablet [id number] Yes TAKE 1 TABLET BY MOUTH  TWICE DAILY WITH A MEAL Hoyt Koch, MD Taking Active Self  metoprolol succinate (TOPROL-XL) 100 MG 24 hr tablet [id number] Yes TAKE 1 TABLET BY MOUTH  DAILY OR IMMEDIATELY  FOLLOWING A MEAL  Patient taking differently: 100 mg.   Hoyt Koch, MD Taking Active   sacubitril-valsartan [facility]) 97-103 MG [id number] Yes Take 1 tablet by mouth 2 (two) times daily. Pixie Casino, MD Taking Active   spironolactone (ALDACTONE) 25 MG tablet [id number] Yes Take 0.5 tablets (12.5 mg total) by mouth daily. Regalado, Cassie Freer, MD Taking Active   tamsulosin (FLOMAX) 0.4 MG CAPS capsule [id number] Yes TAKE 1 CAPSULE BY MOUTH  DAILY Hoyt Koch, MD Taking Active             Patient Active Problem List   Diagnosis Date Noted   Diplopia 02/15/2021   GERD without esophagitis 02/15/2021   Diabetes mellitus type 2 with complications ([place]) 62/83/6629   Vertigo as late effect of stroke 02/14/2021   Ventricular tachycardia 01/03/2020   Nonischemic cardiomyopathy ([place]) 09/27/2017   Heart valve disease 09/27/2017   History of cardiac arrest 09/27/2017   Alcohol use 05/05/2017   Coronary artery disease involving native coronary artery of native heart without angina pectoris 05/05/2017   OSA on CPAP 05/05/2017   History of repair of mitral valve 05/05/2017   Benign prostatic hyperplasia 05/16/2015   Routine general medical examination at a health care facility 47/65/4650   Chronic systolic CHF (congestive heart failure) ([place]) 10/25/2012   AF (paroxysmal atrial fibrillation) ([place]) 10/25/2012   Hyperlipidemia associated with type 2 diabetes mellitus ([place]) 09/04/2012   Severe mitral regurgitation    Coronary artery disease 08/12/2012   Essential  hypertension 09/27/2008   SLEEP APNEA 09/27/2008    Immunization History  Administered Date(s) Administered   Fluad Quad(high Dose 65+) 03/04/2019, 05/16/2020, 05/20/2021   Influenza, High Dose Seasonal PF 05/03/2015, 04/15/2017, 04/22/2018   Influenza,inj,Quad PF,6+ Mos 04/09/2014, 05/11/2016   Influenza-Unspecified 04/26/2013, 05/09/2015   PFIZER(Purple Top)SARS-COV-2 Vaccination 08/18/2019, 09/10/2019, 05/18/2020, 01/24/2021   Pneumococcal Conjugate-13 11/13/2014   Pneumococcal Polysaccharide-23 05/11/2016   Pneumococcal-Unspecified 02/24/2010   Tdap 08/20/2010   Zoster Recombinat (Shingrix) 04/26/2017, 07/01/2017   Zoster, Live 08/06/2009    Conditions to be addressed/monitored:  Hypertension, Hyperlipidemia, Diabetes, Atrial Fibrillation, Heart Failure, and Coronary Artery Disease  Patient Care Plan: CCM Pharmacy Care Plan     Problem Identified: Hypertension, Hyperlipidemia, Diabetes, Atrial Fibrillation, Heart Failure, and Coronary Artery Disease   Priority: High     Long-Range Goal: Disease management   Start Date: 02/24/2021  Expected End Date: 02/24/2022  Recent Progress: On track  Priority: High  Note:   Current Barriers:  Unable to independently afford treatment regimen Unable to independently monitor therapeutic efficacy  Pharmacist Clinical Goal(s):  Patient will verbalize ability to afford treatment regimen achieve adherence to monitoring guidelines and medication adherence to achieve therapeutic efficacy through collaboration with PharmD and provider.   Interventions: 1:1 collaboration with Hoyt Koch, MD regarding development and update of comprehensive plan of care as evidenced by provider attestation and co-signature Inter-disciplinary care team collaboration (see longitudinal plan of care) Comprehensive medication review performed; medication list updated in electronic medical record  Heart Failure / Hypertension    HF Type: Diastolic  (grade II dysfunction), mild LVH Last ejection fraction: 55-60% (01/26/2020) NYHA Class: I (no actitivty limitation)   BP goal is:  <130/80 Patient checks BP at home  infrequently Patient home BP readings are ranging: 138/80   Patient has failed these meds in past: ramipril, amlodipine Patient is currently controlled on the following medications:  Metoprolol succinate 100 mg daily PM Entresto 49-51 mg BID Spironolactone 25 mg - 1/2 tab daily Farxiga 10 mg daily   We discussed: pt reports [place] and Delene Loll are too expensive to continue long term; he applied for [facility] pt assistance through cardiology office and has not heard anything in several weeks; pt reports income < $40,000 per year; in the interest of simplifying medication access, enrolled pt in [place] for cardiomyopathy so he can continue to receive meds through his pharmacy   Plan: Continue current medications  [employer] grant approved for Lisabeth Register thru 02/28/2022   AFIB    Patient is currently rate controlled Patient has failed these meds in past: Eliquis, Xarelto, warfarin Patient is currently controlled on the following medications:  Metoprolol succinate 100 mg daily AM Aspirin 81 mg daily AM   We discussed: Pt is not on anticoagulation d/t hx of thigh hematoma while on Xarelto   Plan: Continue current medications   Hyperlipidemia / CAD    LDL goal < 70 CAD, NICM; stroke 02/2021  Patient has failed these meds in past: n/a Patient is currently controlled on the following medications:  Atorvastatin 40 mg daily PM Fenofibrate 145 mg daily AM Clopidogrel 75 mg daily   We discussed:  pt has completed 3-week course of DAPT and continues clopidogrel monotherapy for recent stroke; counseled on indication for clopidogrel and risk for bleeding   Plan: Continue current medications   Diabetes    A1c goal <7% Fasting BG goal 80-130 Post-prandial BG goal < 180  Recent home BG: AM - 95-105    Patient has failed these meds in past: Jardiance 25 mg (cost $400) Patient is currently controlled on the following medications: Metformin 1000 mg BID Humalog 75/25 mix - 22 units BID Testing supplies (Accu-Chek)   We discussed: A1c increased drastically between March and August (6.8 to 11.5%), pt reports 1 episode of weakness/clamminess that may have been hypoglycemia but pt did not check sugar at the time because he did not want to "waste his strips", he drank orange juice and felt better -Pt reports he ordered a year supply of testing supplies "online", on further questioning this was not through his insurance/mail order pharmacy but through [employer]; advised pt diabetes supplies are covered under insurance and he can get them through his pharmacy in the future -counseled on hypoglycemia prevention, rule of 15s; advised to always check BG if experiencing s/sx of hypoglycemia   Plan: Continue current medications  Patient Goals/Self-Care Activities Patient will:  - take medications as prescribed -focus on medication adherence by routine -check glucose daily, document, and provide at future appointments -check blood pressure 3x a week, document, and provide at future appointments     Medication Assistance:  [place] (cardiomyopathy) - Lisabeth Register --enrolled 03/31/2021  Compliance/Adherence/Medication fill history: Care Gaps: Foot exam (due 01/15/21) Vaccines - covid booster, TDAP  Star-Rating Drugs: Atorvastatin - LF 01/10/21 x 90 ds Entresto - LF 02/18/21 x 30 ds Metformin - LF 01/14/21 x 90 ds  Patient's preferred pharmacy is:  [id number] [place] [place]Phone: [phone]: [phone] [facility] [place]Phone:  [phone] Fax: [phone]  Uses pill box? Yes Pt  endorses 100% compliance  We discussed: Current pharmacy is preferred with insurance plan and patient is satisfied with pharmacy services Patient decided to: Continue current medication management strategy  Care Plan and Follow Up Patient Decision:  Patient agrees to Care Plan and Follow-up.  Plan: Telephone follow up appointment with care management team member scheduled for:  3 months  Tomasa Blase, PharmD Clinical Pharmacist, [place]

## 2021-06-20 ENCOUNTER — Other Ambulatory Visit: Payer: Self-pay

## 2021-06-20 ENCOUNTER — Ambulatory Visit (INDEPENDENT_AMBULATORY_CARE_PROVIDER_SITE_OTHER): Payer: Medicare Other | Admitting: Internal Medicine

## 2021-06-20 ENCOUNTER — Encounter (HOSPITAL_BASED_OUTPATIENT_CLINIC_OR_DEPARTMENT_OTHER): Payer: Self-pay | Admitting: Internal Medicine

## 2021-06-20 VITALS — BP 130/68 | HR 95 | Ht 70.0 in | Wt 240.0 lb

## 2021-06-20 DIAGNOSIS — I10 Essential (primary) hypertension: Secondary | ICD-10-CM | POA: Diagnosis not present

## 2021-06-20 DIAGNOSIS — I255 Ischemic cardiomyopathy: Secondary | ICD-10-CM

## 2021-06-20 DIAGNOSIS — I469 Cardiac arrest, cause unspecified: Secondary | ICD-10-CM

## 2021-06-20 DIAGNOSIS — I2510 Atherosclerotic heart disease of native coronary artery without angina pectoris: Secondary | ICD-10-CM

## 2021-06-20 DIAGNOSIS — I4901 Ventricular fibrillation: Secondary | ICD-10-CM

## 2021-06-20 NOTE — Patient Instructions (Addendum)
Medication Instructions:  Your physician recommends that you continue on your current medications as directed. Please refer to the Current Medication list given to you today. *If you need a refill on your cardiac medications before your next appointment, please call your pharmacy*  Lab Work: None. If you have labs (blood work) drawn today and your tests are completely normal, you will receive your results only by: [place] (if you have MyChart) OR A paper copy in the mail If you have any lab test that is abnormal or we need to change your treatment, we will call you to review the results.  Testing/Procedures: None.  Follow-Up: At [facility], you and your health needs are our priority.  As part of our continuing mission to provide you with exceptional heart care, we have created designated Provider Care Teams.  These Care Teams include your primary Cardiologist (physician) and Advanced Practice Providers (APPs -  Physician Assistants and Nurse Practitioners) who all work together to provide you with the care you need, when you need it.  Your physician wants you to follow-up in: 12 months with  one of the following Advanced Practice Providers on your designated Care Team:    Legrand Como "Jonni Sanger" Chalmers Cater, [place]   You will receive a reminder letter in the mail two months in advance. If you don't receive a letter, please call our office to schedule the follow-up appointment.  Remote monitoring is used to monitor your ICD from home. This monitoring reduces the number of office visits required to check your device to one time per year. It allows [place] to keep an eye on the functioning of your device to ensure it is working properly. You are scheduled for a device check from home on 07/14/21. You may send your transmission at any time that day. If you have a wireless device, the transmission will be sent automatically. After your physician reviews your transmission, you will receive a postcard with  your next transmission date.  We recommend signing up for the patient portal called "MyChart".  Sign up information is provided on this After Visit Summary.  MyChart is used to connect with patients for Virtual Visits (Telemedicine).  Patients are able to view lab/test results, encounter notes, upcoming appointments, etc.  Non-urgent messages can be sent to your provider as well.   To learn more about what you can do with MyChart, go to [email or web address].    Any Other Special Instructions Will Be Listed Below (If Applicable).

## 2021-06-20 NOTE — Progress Notes (Signed)
PCP: Myrlene Broker, MD Primary Cardiologist: Dr Rennis Golden Primary EP: Dr Johney Frame  Christopher [name] is a 70 y.o. male who presents today for routine electrophysiology followup.  Since last being seen in our clinic, the patient reports doing very well.  Today, he denies symptoms of palpitations, chest pain, shortness of breath,  lower extremity edema, dizziness, presyncope, syncope, or ICD shocks.  The patient is otherwise without complaint today.   Past Medical History:  Diagnosis Date   Allergy    Arthritis    Atrial fibrillation (HCC)    post op, intol of anticoag   Cardiac arrest [facility])    a. s/p MDT single chamber ICD; 11-10-18- pt denies having a heart attack   Chronic kidney disease    kidney stone   Coronary artery disease    a. 2/7 Cath: LM nl, LAD min irregs, LCX min irregs, RI 40, RCA 32m, EF 55-60% basal to mid inf HK, 3-4+ MR;  b. 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 Mitral valve prolapse with flail segment of posterior leaflet and severe MR by TEE, remote h/o bacterial endocarditis    Sleep apnea    NPSG 01/21/06- AHI 40.7/hr cpap   Subacute bacterial endocarditis 03/22/2008   Strep viridans   Ventricular fibrillation (HCC) 11/07/2019   appropriate shock (36J) for VF delivered   Past Surgical History:  Procedure Laterality Date   AMPUTATION  07/28/2012   Procedure: AMPUTATION DIGIT;  Surgeon: [name], MD;  Location: [facility] ORS;  Service: Orthopedics;  Laterality: Right;  2nd toe   CARDIAC CATHETERIZATION     COLONOSCOPY     EP IMPLANTABLE DEVICE N/A 05/01/2016    Procedure: Loop Recorder Removal;  Surgeon: [name] Range, MD;  [facility]: [facility] INVASIVE CV LAB;  Service: Cardiovascular;  Laterality: N/A;   FOOT SURGERY     BILATERAL TOES   ICD IMPLANT N/A 05/07/2017    Medtronic Visia AF MRI VR SureScan implanted by Dr Elberta Fortis following VF arrest   Implantable loop recorder placement  03/31/13   MDT Linq implanted by Dr Johney Frame to evaluate for further afib   INTRAOPERATIVE TRANSESOPHAGEAL ECHOCARDIOGRAM N/A 09/27/2012   Procedure: INTRAOPERATIVE TRANSESOPHAGEAL ECHOCARDIOGRAM;  Surgeon: [name], MD;  Location: [facility] OR;  Service: Open Heart Surgery;  Laterality: N/A;   LEFT AND RIGHT HEART CATHETERIZATION WITH CORONARY ANGIOGRAM N/A 08/12/2012   Procedure: LEFT AND RIGHT HEART CATHETERIZATION WITH CORONARY ANGIOGRAM;  Surgeon: [name], MD;  Location: [facility] CATH LAB;  Service: Cardiovascular;  Laterality: N/A;   LEFT HEART CATH AND CORONARY ANGIOGRAPHY N/A 05/06/2017   Procedure: LEFT HEART CATH AND CORONARY ANGIOGRAPHY;  Surgeon: Marykay Lex, MD;  Location: [facility] INVASIVE CV LAB;  Service: Cardiovascular;  Laterality: N/A;   LOOP RECORDER IMPLANT N/A 03/31/2013   Procedure: LOOP RECORDER IMPLANT;  Surgeon: Gardiner Rhyme, MD;  [facility]: [facility] CATH LAB;  Service: Cardiovascular;  Laterality: N/A;   MITRAL VALVE REPAIR Right 09/27/2012   Procedure: MINIMALLY INVASIVE MITRAL VALVE REPAIR (MVR);  Surgeon: [name], MD;  Location: [facility] OR;  Service: Open Heart Surgery;  Laterality: Right;  Ultrasound guided   PERCUTANEOUS CORONARY STENT INTERVENTION (PCI-S) N/A 08/25/2012   Procedure: PERCUTANEOUS CORONARY STENT INTERVENTION (PCI-S);  Surgeon: Sherren Mocha, MD;  Location: [facility] CATH LAB;  Service: Cardiovascular;  Laterality: N/A;   SEPTOPLASTY     Dr. [name]   TEE WITHOUT CARDIOVERSION N/A 08/12/2012   Procedure: TRANSESOPHAGEAL ECHOCARDIOGRAM (TEE);  Surgeon: [name], MD;  Location: [place];  Service: Cardiovascular;  Laterality: N/A;    TONSILLECTOMY     UVULOPALATOPHARYNGOPLASTY      ROS- all systems are reviewed and negative except as per HPI above  Current Outpatient Medications  Medication Sig Dispense Refill   acetaminophen (TYLENOL) 500 MG tablet Take 500 mg by mouth every 6 (six) hours as needed for mild pain or headache.      atorvastatin (LIPITOR) 40 MG tablet TAKE 1 TABLET BY MOUTH  DAILY 90 tablet 3   blood glucose meter kit and supplies KIT Dispense based on patient and insurance preference. Use up to four times daily as directed. 1 each 0   cetirizine (ZYRTEC) 10 MG tablet Take 10 mg by mouth daily.     cholecalciferol (VITAMIN D) 25 MCG (1000 UNIT) tablet Take 1 tablet by mouth daily.      clopidogrel (PLAVIX) 75 MG tablet Take 1 tablet (75 mg total) by mouth daily. 90 tablet 3   dapagliflozin propanediol (FARXIGA) 10 MG TABS tablet Take 1 tablet (10 mg total) by mouth daily before breakfast. 30 tablet 11   fenofibrate (TRICOR) 145 MG tablet TAKE 1 TABLET BY MOUTH  DAILY 90 tablet 3   fluticasone (FLONASE) 50 MCG/ACT nasal spray USE 2 SPRAYS IN BOTH  NOSTRILS DAILY 48 g 1   Insulin Lispro Prot & Lispro (HUMALOG MIX 75/25 KWIKPEN) (75-25) 100 UNIT/ML Kwikpen Inject 22 Units into the skin in the morning and at bedtime. (Patient taking differently: Inject 20 Units into the skin in the morning and at bedtime.) 15 mL 11   metFORMIN (GLUCOPHAGE) 1000 MG tablet TAKE 1 TABLET BY MOUTH  TWICE DAILY WITH A MEAL 180 tablet 3   metoprolol succinate (TOPROL-XL) 100 MG 24 hr tablet TAKE 1 TABLET BY MOUTH  DAILY IMMEDIATELY FOLLOWING A MEAL 90 tablet 3   OVER THE COUNTER MEDICATION 1 tablet in the morning and at bedtime. [facility] - Restore multivitamin     sacubitril-valsartan (ENTRESTO) 97-103 MG Take 1 tablet by mouth 2 (two) times daily. 180 tablet 3   spironolactone (ALDACTONE) 25 MG tablet Take 0.5 tablets (12.5 mg total) by mouth daily. 30 tablet 3   tamsulosin (FLOMAX) 0.4 MG CAPS capsule TAKE 1 CAPSULE BY MOUTH   DAILY 90 capsule 3   No current facility-administered medications for this visit.    Physical Exam: Vitals:   06/20/21 1526  BP: 130/68  Pulse: 95  SpO2: 97%  Weight: 240 lb (108.9 kg)  Height: $Remove'5\' 10"'VIiYwnO$  (1.778 m)    GEN- The patient is well appearing, alert and oriented x 3 today.   Head- normocephalic, atraumatic Eyes-  Sclera clear, conjunctiva pink Ears- hearing intact Oropharynx- clear Lungs- Clear to ausculation bilaterally, normal work of breathing Chest- ICD pocket is well healed Heart- Regular rate and rhythm, no murmurs, rubs or gallops, PMI not laterally displaced GI- soft, NT, ND, + BS Extremities- no clubbing, cyanosis, or edema  ICD interrogation- reviewed in detail today,  See PACEART report  ekg tracing ordered today is personally reviewed  and shows sinus rhythm 95 bpm, with PVCs, PR 208 msec, RBBB  Wt Readings from Last 3 Encounters:  06/20/21 240 lb (108.9 kg)  05/20/21 235 lb 12.8 oz (107 kg)  04/09/21 235 lb (106.6 kg)    Assessment and Plan:  1.  Chronic systolic dysfunction/ CAD/ ischemic CM euvolemic today Stable on an appropriate medical regimen Normal ICD function See [facility] report No changes today he is not device dependant today followed in ICM device clinic  2. VF S/p prior appropriate ICD therapy Currently stable  3. Valvular heart disease S/p MV repair for endocarditis Stable No change required today  4. HTN Stable No change required today  5. Prior stroke No afib by ICD  Return in a year He has scheduled follow-up with general cardiology  Thompson Grayer MD, [facility] 06/20/2021 3:40 PM'

## 2021-07-02 ENCOUNTER — Encounter: Payer: Self-pay | Admitting: Internal Medicine

## 2021-07-02 ENCOUNTER — Ambulatory Visit: Payer: Medicare Other | Admitting: Internal Medicine

## 2021-07-02 ENCOUNTER — Other Ambulatory Visit: Payer: Self-pay

## 2021-07-02 VITALS — BP 144/78 | HR 71 | Ht 70.0 in | Wt 237.4 lb

## 2021-07-02 DIAGNOSIS — I428 Other cardiomyopathies: Secondary | ICD-10-CM | POA: Diagnosis not present

## 2021-07-02 DIAGNOSIS — Z95810 Presence of automatic (implantable) cardiac defibrillator: Secondary | ICD-10-CM

## 2021-07-02 DIAGNOSIS — Z8673 Personal history of transient ischemic attack (TIA), and cerebral infarction without residual deficits: Secondary | ICD-10-CM

## 2021-07-02 DIAGNOSIS — E785 Hyperlipidemia, unspecified: Secondary | ICD-10-CM

## 2021-07-02 DIAGNOSIS — Z9889 Other specified postprocedural states: Secondary | ICD-10-CM | POA: Diagnosis not present

## 2021-07-02 NOTE — Progress Notes (Signed)
OFFICE NOTE  Chief Complaint:  Follow-up heart failure  Primary Care Physician: Hoyt Koch, MD  HPI:  Christopher [name] is a 70 y.o. 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  Subsequently he had cardiac arrest and had a single-chamber ICD placed in 2018 due to nonischemic cardiomyopathy.  He has had a history of atrial fibrillation, hypertension dyslipidemia and sleep apnea.  He is followed by Dr. Rayann Heman for his ICD.  At one point his LVEF had improved up to 55 to 60% on medical therapy however this past summer he had a pontine stroke associated diplopia.  Repeat echocardiogram showed a newly reduced EF 30 to 35% with anterior apical and inferoapical wall motion abnormality suggestive of Tak[place]ubo versus LAD territory ischemia.  Given recent stroke ischemic evaluation was not performed.  He had not been complaining of chest pain.  Subsequently followed up with Almyra Deforest, PA-C, who has been increasing his heart failure medicine regimen, titrating his Entresto up to the highest dose and adding Farxiga in addition to spironolactone, beta-blocker and other heart failure therapies.  Repeat echocardiogram was performed in November which showed improvement in LVEF up to 40 to 45% however his EF is not normalized.  He denies any anginal symptoms but does get some mild shortness of breath with exertion.  PMHx:  Past Medical History:  Diagnosis Date   Allergy    Arthritis    Atrial fibrillation ([place])    post op, intol of anticoag   Cardiac arrest [facility])    a. s/p MDT single chamber ICD; 11-10-18- pt denies having a heart attack   Chronic kidney disease    kidney stone   Coronary artery disease    a. 2/7 Cath: LM nl, LAD min irregs, LCX min irregs, RI 40, RCA 58m, EF 55-60% basal to mid inf HK, 3-4+ MR;  b. 08/25/2012 PCI of RCA with 4.0x15 Vision  BMS   Diabetes mellitus without complication (HCC)    GERD (gastroesophageal reflux disease)    hx   Heart murmur    Hyperlipidemia    on statin   Hypertension    S/P mitral valve repair 09/27/2012   Complex valvuloplasty including triangular resection of flail posterior leaflet with 30 mm Sorin Memo 3D ring annuloplasty via right mini thoracotomy approach   Severe mitral regurgitation    a. Mitral valve prolapse with flail segment of posterior leaflet and severe MR by TEE, remote h/o bacterial endocarditis    Sleep apnea    NPSG 01/21/06- AHI 40.7/hr cpap   Subacute bacterial endocarditis 03/22/2008   Strep viridans   Ventricular fibrillation ([place]) 11/07/2019   appropriate shock (36J) for VF delivered    Past Surgical History:  Procedure Laterality Date   AMPUTATION  07/28/2012   Procedure: AMPUTATION DIGIT;  Surgeon: Colin Rhein, MD;  [facility]: [facility] ORS;  Service: Orthopedics;  Laterality: Right;  2nd toe   CARDIAC CATHETERIZATION     COLONOSCOPY     EP IMPLANTABLE DEVICE N/A 05/01/2016   Procedure: Loop Recorder Removal;  Surgeon: Thompson Grayer, MD;  Location: [place] CV LAB;  Service: Cardiovascular;  Laterality: N/A;   FOOT SURGERY     BILATERAL TOES   ICD IMPLANT N/A 05/07/2017    Medtronic Visia AF MRI VR SureScan implanted by Dr Curt Bears following VF arrest   Implantable loop recorder  placement  03/31/13   MDT Linq implanted by Dr Rayann Heman to evaluate for further afib   INTRAOPERATIVE TRANSESOPHAGEAL ECHOCARDIOGRAM N/A 09/27/2012   Procedure: INTRAOPERATIVE TRANSESOPHAGEAL ECHOCARDIOGRAM;  Surgeon: [name], MD;  Location: [place];  Service: Open Heart Surgery;  Laterality: N/A;   LEFT AND RIGHT HEART CATHETERIZATION WITH CORONARY ANGIOGRAM N/A 08/12/2012   Procedure: LEFT AND RIGHT HEART CATHETERIZATION WITH CORONARY ANGIOGRAM;  Surgeon: [name], MD;  Location: [facility] CATH LAB;  Service: Cardiovascular;  Laterality: N/A;   LEFT HEART CATH AND CORONARY ANGIOGRAPHY N/A  05/06/2017   Procedure: LEFT HEART CATH AND CORONARY ANGIOGRAPHY;  Surgeon: [name], MD;  Location: [place] CV LAB;  Service: Cardiovascular;  Laterality: N/A;   LOOP RECORDER IMPLANT N/A 03/31/2013   Procedure: LOOP RECORDER IMPLANT;  Surgeon: [name], MD;  Location: [place] CATH LAB;  Service: Cardiovascular;  Laterality: N/A;   MITRAL VALVE REPAIR Right 09/27/2012   Procedure: MINIMALLY INVASIVE MITRAL VALVE REPAIR (MVR);  Surgeon: [name], MD;  Location: [place];  Service: Open Heart Surgery;  Laterality: Right;  Ultrasound guided   PERCUTANEOUS CORONARY STENT INTERVENTION (PCI-S) N/A 08/25/2012   Procedure: PERCUTANEOUS CORONARY STENT INTERVENTION (PCI-S);  Surgeon: Sherren Mocha, MD;  Location: [facility] CATH LAB;  Service: Cardiovascular;  Laterality: N/A;   SEPTOPLASTY     Dr. [name]   TEE WITHOUT CARDIOVERSION N/A 08/12/2012   Procedure: TRANSESOPHAGEAL ECHOCARDIOGRAM (TEE);  Surgeon: [name], MD;  Location: [facility] ENDOSCOPY;  Service: Cardiovascular;  Laterality: N/A;   TONSILLECTOMY     UVULOPALATOPHARYNGOPLASTY      FAMHx:  Family History  Problem Relation Age of Onset   Heart disease Father    Colon cancer Father 75   Diabetes Father    Renal cancer Father    Diabetes Mother    Stroke Mother    Sudden death Brother    Heart attack Brother    Esophageal cancer Neg Hx    Rectal cancer Neg Hx    Stomach cancer Neg Hx     SOCHx:   reports that he quit smoking about 43 years ago. His smoking use included cigarettes. He has a 12.50 pack-year smoking history. He has never used smokeless tobacco. He reports current alcohol use. He reports that he does not use drugs.  ALLERGIES:  Allergies  Allergen Reactions   Codeine Other (See Comments)    Causes bad constipation   Dust Mite Extract Cough   Pollen Extract Cough    ROS: Pertinent items noted in HPI and remainder of comprehensive ROS otherwise negative.  HOME MEDS: Current Outpatient Medications on  File Prior to Visit  Medication Sig Dispense Refill   acetaminophen (TYLENOL) 500 MG tablet Take 500 mg by mouth every 6 (six) hours as needed for mild pain or headache.      atorvastatin (LIPITOR) 40 MG tablet TAKE 1 TABLET BY MOUTH  DAILY 90 tablet 3   blood glucose meter kit and supplies KIT Dispense based on patient and insurance preference. Use up to four times daily as directed. 1 each 0   cetirizine (ZYRTEC) 10 MG tablet Take 10 mg by mouth daily.     cholecalciferol (VITAMIN D) 25 MCG (1000 UNIT) tablet Take 1 tablet by mouth daily.      clopidogrel (PLAVIX) 75 MG tablet Take 1 tablet (75 mg total) by mouth daily. 90 tablet 3   dapagliflozin propanediol (FARXIGA) 10 MG TABS tablet Take 1 tablet (10 mg total) by  mouth daily before breakfast. 30 tablet 11   fenofibrate (TRICOR) 145 MG tablet TAKE 1 TABLET BY MOUTH  DAILY 90 tablet 3   fluticasone (FLONASE) 50 MCG/ACT nasal spray USE 2 SPRAYS IN BOTH  NOSTRILS DAILY 48 g 1   Insulin Lispro Prot & Lispro (HUMALOG MIX 75/25 KWIKPEN) (75-25) 100 UNIT/ML Kwikpen Inject 22 Units into the skin in the morning and at bedtime. (Patient taking differently: Inject 20 Units into the skin in the morning and at bedtime.) 15 mL 11   metFORMIN (GLUCOPHAGE) 1000 MG tablet TAKE 1 TABLET BY MOUTH  TWICE DAILY WITH A MEAL 180 tablet 3   metoprolol succinate (TOPROL-XL) 100 MG 24 hr tablet TAKE 1 TABLET BY MOUTH  DAILY IMMEDIATELY FOLLOWING A MEAL 90 tablet 3   OVER THE COUNTER MEDICATION 1 tablet in the morning and at bedtime. [facility] - Restore multivitamin     sacubitril-valsartan (ENTRESTO) 97-103 MG Take 1 tablet by mouth 2 (two) times daily. 180 tablet 3   spironolactone (ALDACTONE) 25 MG tablet Take 0.5 tablets (12.5 mg total) by mouth daily. 30 tablet 3   tamsulosin (FLOMAX) 0.4 MG CAPS capsule TAKE 1 CAPSULE BY MOUTH  DAILY 90 capsule 3   No current facility-administered medications on file prior to visit.    LABS/IMAGING: No results found for  this or any previous visit (from the past 48 hour(s)). No results found.  LIPID PANEL:    Component Value Date/Time   CHOL 125 02/15/2021 0458   CHOL 124 04/17/2019 0753   TRIG 303 (H) 02/15/2021 0458   HDL 30 (L) 02/15/2021 0458   HDL 33 (L) 04/17/2019 0753   CHOLHDL 4.2 02/15/2021 0458   VLDL 61 (H) 02/15/2021 0458   LDLCALC 34 02/15/2021 0458   LDLCALC 52 04/17/2019 0753   LDLDIRECT 52.0 07/18/2019 1454     WEIGHTS: Wt Readings from Last 3 Encounters:  07/02/21 237 lb 6.4 oz (107.7 kg)  06/20/21 240 lb (108.9 kg)  05/20/21 235 lb 12.8 oz (107 kg)    VITALS: BP (!) 144/78    Pulse 71    Ht $R'5\' 10"'Lh$  (1.778 m)    Wt 237 lb 6.4 oz (107.7 kg)    SpO2 97%    BMI 34.06 kg/m   EXAM: General appearance: alert and no distress Neck: no carotid bruit, no JVD, and thyroid not enlarged, symmetric, no tenderness/mass/nodules Lungs: clear to auscultation bilaterally Heart: regular rate and rhythm, S1, S2 normal, no murmur, click, rub or gallop Abdomen: soft, non-tender; bowel sounds normal; no masses,  no organomegaly Extremities: extremities normal, atraumatic, no cyanosis or edema Pulses: 2+ and symmetric Skin: Skin color, texture, turgor normal. No rashes or lesions Neurologic: Grossly normal Psych: Pleasant  EKG: Deferred  ASSESSMENT: CAD status post PCI to the RCA in 2014 Cardiac arrest in 2018 due to nonischemic cardiomyopathy status post AICD History of subacute bacterial endocarditis status post mitral valve repair History of stroke in 02/2021 Hypertension Dyslipidemia OSA on CPAP  PLAN: 1.   Mr. Keys says that he feels like he is doing fairly well although does get short of breath with exertion.  His last echo showed some improvement in LVEF up to 40 to 45% from as low as 30 to 35% this past summer.  At that time he had stroke.  Ischemic evaluation was not pursued due to decreased LV function and he has not had any coronary symptoms noted since then.  He only had mild  coronary disease on  prior cath in 2018.  I think at this point this is recurrent nonischemic cardiomyopathy and optimization of his medicines as is being undertaken is the right course of therapy.  We will likely need to reassess his LVEF in about 6 months.   Follow-up with me afterwards.  Pixie Casino, MD, [facility], [place] Director of the Advanced Lipid Disorders &  Cardiovascular Risk Reduction Clinic Diplomate of the American Board of Clinical Lipidology Attending Cardiologist  Direct Dial: [phone]   Fax: [phone]  Website:  [email or web address] 07/02/2021, 1:46 PM

## 2021-07-02 NOTE — Patient Instructions (Signed)
Medication Instructions:  The current medical regimen is effective;  continue present plan and medications.  *If you need a refill on your cardiac medications before your next appointment, please call your pharmacy*   Follow-Up: At [facility], you and your health needs are our priority.  As part of our continuing mission to provide you with exceptional heart care, we have created designated Provider Care Teams.  These Care Teams include your primary Cardiologist (physician) and Advanced Practice Providers (APPs -  Physician Assistants and Nurse Practitioners) who all work together to provide you with the care you need, when you need it.  We recommend signing up for the patient portal called "MyChart".  Sign up information is provided on this After Visit Summary.  MyChart is used to connect with patients for Virtual Visits (Telemedicine).  Patients are able to view lab/test results, encounter notes, upcoming appointments, etc.  Non-urgent messages can be sent to your provider as well.   To learn more about what you can do with MyChart, go to [email or web address].    Your next appointment:   6 month(s)  The format for your next appointment:   In Person  Provider:   Pixie Casino, MD

## 2021-07-14 ENCOUNTER — Ambulatory Visit (INDEPENDENT_AMBULATORY_CARE_PROVIDER_SITE_OTHER): Payer: Medicare Other

## 2021-07-14 DIAGNOSIS — I5022 Chronic systolic (congestive) heart failure: Secondary | ICD-10-CM

## 2021-07-14 DIAGNOSIS — Z95810 Presence of automatic (implantable) cardiac defibrillator: Secondary | ICD-10-CM | POA: Diagnosis not present

## 2021-07-17 ENCOUNTER — Telehealth: Payer: Self-pay

## 2021-07-17 NOTE — Progress Notes (Signed)
Chronic Care Management Pharmacy Assistant   Name: Christopher [name]  MRN: [id number] DOB: 10/20/1950   Reason for Encounter: Disease State   Conditions to be addressed/monitored: HTN   Recent office visits:  None ID  Recent consult visits:  07/02/21 Pixie Casino, MD-Cardiology (Cardiomyopathy) No orders or med changes  06/20/21 Allred, Jeneen Rinks, MD-Cardiology (Cardiac arrest with ventricular fibrillation) EKG ordered, no med changes  Hospital visits:  None in previous 6 months  Medications: Outpatient Encounter Medications as of 07/17/2021  Medication Sig Note   acetaminophen (TYLENOL) 500 MG tablet Take 500 mg by mouth every 6 (six) hours as needed for mild pain or headache.     atorvastatin (LIPITOR) 40 MG tablet TAKE 1 TABLET BY MOUTH  DAILY    blood glucose meter kit and supplies KIT Dispense based on patient and insurance preference. Use up to four times daily as directed.    cetirizine (ZYRTEC) 10 MG tablet Take 10 mg by mouth daily.    cholecalciferol (VITAMIN D) 25 MCG (1000 UNIT) tablet Take 1 tablet by mouth daily.     clopidogrel (PLAVIX) 75 MG tablet Take 1 tablet (75 mg total) by mouth daily.    dapagliflozin propanediol (FARXIGA) 10 MG TABS tablet Take 1 tablet (10 mg total) by mouth daily before breakfast. 03/31/2021: [employer] grant thru 02/28/2022   fenofibrate (TRICOR) 145 MG tablet TAKE 1 TABLET BY MOUTH  DAILY    fluticasone (FLONASE) 50 MCG/ACT nasal spray USE 2 SPRAYS IN BOTH  NOSTRILS DAILY    Insulin Lispro Prot & Lispro (HUMALOG MIX 75/25 KWIKPEN) (75-25) 100 UNIT/ML Kwikpen Inject 22 Units into the skin in the morning and at bedtime. (Patient taking differently: Inject 20 Units into the skin in the morning and at bedtime.)    metFORMIN (GLUCOPHAGE) 1000 MG tablet TAKE 1 TABLET BY MOUTH  TWICE DAILY WITH A MEAL    metoprolol succinate (TOPROL-XL) 100 MG 24 hr tablet TAKE 1 TABLET BY MOUTH  DAILY IMMEDIATELY FOLLOWING A MEAL    OVER THE COUNTER  MEDICATION 1 tablet in the morning and at bedtime. [facility] - Restore multivitamin    sacubitril-valsartan (ENTRESTO) 97-103 MG Take 1 tablet by mouth 2 (two) times daily.    spironolactone (ALDACTONE) 25 MG tablet Take 0.5 tablets (12.5 mg total) by mouth daily.    tamsulosin (FLOMAX) 0.4 MG CAPS capsule TAKE 1 CAPSULE BY MOUTH  DAILY    No facility-administered encounter medications on file as of 07/17/2021.    Recent Office Vitals: BP Readings from Last 3 Encounters:  07/02/21 (!) 144/78  06/20/21 130/68  05/20/21 130/70   Pulse Readings from Last 3 Encounters:  07/02/21 71  06/20/21 95  05/20/21 (!) 58    Wt Readings from Last 3 Encounters:  07/02/21 237 lb 6.4 oz (107.7 kg)  06/20/21 240 lb (108.9 kg)  05/20/21 235 lb 12.8 oz (107 kg)     Kidney Function Lab Results  Component Value Date/Time   CREATININE 1.25 04/18/2021 03:42 PM   CREATININE 1.28 (H) 03/28/2021 10:56 AM   CREATININE 1.07 05/03/2013 09:59 AM   GFR 56.84 (L) 02/27/2021 09:23 AM   GFRNONAA >60 02/18/2021 01:00 AM   GFRAA >60 03/31/2020 02:55 PM    BMP Latest Ref Rng & Units 04/18/2021 03/28/2021 02/27/2021  Glucose 70 - 99 mg/dL 99 79 139(H)  BUN 8 - 27 mg/dL $Remove'19 21 20  'eAsSWxU$ Creatinine 0.76 - 1.27 mg/dL 1.25 1.28(H) 1.27  BUN/Creat Ratio 10 - 24  15 16 -  Sodium 134 - 144 mmol/L 144 140 140  Potassium 3.5 - 5.2 mmol/L 3.9 4.0 3.6  Chloride 96 - 106 mmol/L 105 100 102  CO2 20 - 29 mmol/L $RemoveB'24 23 29  'qXHTpyfs$ Calcium 8.6 - 10.2 mg/dL 9.5 9.5 9.6   Reviewed chart prior to disease state call. Made 3 attempts to reach patient regarding BP. Unable to reach patient.  Current antihypertensive regimen:  Entresto 97-103 mg Metoprolol succ 100 mg   What recent interventions/DTPs have been made by any provider to improve Blood Pressure control since last CPP Visit: None noted  Any recent hospitalizations or ED visits since last visit with CPP? No   Adherence Review: Is the patient currently on ACE/ARB medication?  Yes Does the patient have >5 day gap between last estimated fill dates? No   Care Gaps: Colonoscopy-11/10/18 Diabetic Foot Exam-05/20/21 Ophthalmology-04/21/21 Dexa Scan - NA Annual Well Visit - NA Micro albumin-NA Hemoglobin A1c- 05/20/21  Star Rating Drugs: Atorvastatin 40 mg-last fill 06/07/21 90 ds Metformin 1000 mg-last fill 06/22/21 90 ds Entresto 97-103 mg-last fill 06/30/21 90 ds  Ethelene Hal Clinical Pharmacist Assistant [phone]

## 2021-07-18 NOTE — Progress Notes (Signed)
EPIC Encounter for ICM Monitoring  Patient Name: Christopher [name] is a 71 y.o. male Date: 07/18/2021 Primary Care Physican: Hoyt Koch, MD Primary Cardiologist: [facility] Electrophysiologist: Allred 07/18/2021 Weight: 232 lbs                             Spoke with patient and heart failure questions reviewed.  Pt asymptomatic for fluid accumulation.  Reports feeling well at this time and voices no complaints.    Optivol thoracic impedance suggesting normal fluid levels.   Spironolactone 25 mg take 0.5 tablet (12.5 total) daily   Labs: 04/18/2021 Creatinine 1.25, BUN 19, Potassium 3.9, Sodium 144, GFR 62 03/28/2021 Creatinine 1.28, BUN 21, Potassium 4.0, Sodium 140, GFR 60  02/18/2021 Creatinine 1.04, BUN 13, Potassium 3.4, Sodium 137, GFR >60 02/17/2021 Creatinine 0.95, BUN 14, Potassium 3.2, Sodium 136, GFR >60  02/16/2021 Creatinine 1.09, BUN 16, Potassium 3.9, Sodium 135, GFR >60  02/15/2021 Creatinine 0.90, BUN 11, Potassium 3.5, Sodium 134, GFR >60  02/14/2021 Creatinine 0.94, BUN 11, Potassium 3.4, Sodium 133, GFR >60  A complete set of results can be found in Results Review.   Recommendations: No changes and encouraged to call if experiencing any fluid symptoms.   Follow-up plan: ICM clinic phone appointment on 08/19/2021.   91 day device clinic remote transmission 08/18/2021.      EP/Cardiology Office Visits:  12/29/2021 with Dr Debara Pickett.  Recall 12/28/2020 with Dr Rayann Heman.     Copy of ICM check sent to Dr. Rayann Heman.    3 month ICM trend: 07/14/2021.    12-14 Month ICM trend:     Rosalene Billings, RN 07/18/2021 4:52 PM

## 2021-08-08 ENCOUNTER — Telehealth: Payer: Self-pay

## 2021-08-08 NOTE — Progress Notes (Signed)
Chronic Care Management Pharmacy Assistant   Name: Christopher [name]  MRN: [id number] DOB: Dec 16, 1950   Reason for Encounter: Disease State   Conditions to be addressed/monitored: HTN  Recent office visits:  None ID  Recent consult visits:  07/02/21 Pixie Casino, MD-Cardiology (Cardiomyopathy) No orders or med changes   06/20/21 Allred, Jeneen Rinks, MD-Cardiology (Cardiac arrest with ventricular fibrillation) EKG ordered, no med changes    Hospital visits:  None in previous 6 months  Medications: Outpatient Encounter Medications as of 08/08/2021  Medication Sig Note   acetaminophen (TYLENOL) 500 MG tablet Take 500 mg by mouth every 6 (six) hours as needed for mild pain or headache.     atorvastatin (LIPITOR) 40 MG tablet TAKE 1 TABLET BY MOUTH  DAILY    blood glucose meter kit and supplies KIT Dispense based on patient and insurance preference. Use up to four times daily as directed.    cetirizine (ZYRTEC) 10 MG tablet Take 10 mg by mouth daily.    cholecalciferol (VITAMIN D) 25 MCG (1000 UNIT) tablet Take 1 tablet by mouth daily.     clopidogrel (PLAVIX) 75 MG tablet Take 1 tablet (75 mg total) by mouth daily.    dapagliflozin propanediol (FARXIGA) 10 MG TABS tablet Take 1 tablet (10 mg total) by mouth daily before breakfast. 03/31/2021: [employer] grant thru 02/28/2022   fenofibrate (TRICOR) 145 MG tablet TAKE 1 TABLET BY MOUTH  DAILY    fluticasone (FLONASE) 50 MCG/ACT nasal spray USE 2 SPRAYS IN BOTH  NOSTRILS DAILY    Insulin Lispro Prot & Lispro (HUMALOG MIX 75/25 KWIKPEN) (75-25) 100 UNIT/ML Kwikpen Inject 22 Units into the skin in the morning and at bedtime. (Patient taking differently: Inject 20 Units into the skin in the morning and at bedtime.)    metFORMIN (GLUCOPHAGE) 1000 MG tablet TAKE 1 TABLET BY MOUTH  TWICE DAILY WITH A MEAL    metoprolol succinate (TOPROL-XL) 100 MG 24 hr tablet TAKE 1 TABLET BY MOUTH  DAILY IMMEDIATELY FOLLOWING A MEAL    OVER THE COUNTER  MEDICATION 1 tablet in the morning and at bedtime. [facility] - Restore multivitamin    sacubitril-valsartan (ENTRESTO) 97-103 MG Take 1 tablet by mouth 2 (two) times daily.    spironolactone (ALDACTONE) 25 MG tablet Take 0.5 tablets (12.5 mg total) by mouth daily.    tamsulosin (FLOMAX) 0.4 MG CAPS capsule TAKE 1 CAPSULE BY MOUTH  DAILY    No facility-administered encounter medications on file as of 08/08/2021.   Reviewed chart prior to disease state call. Spoke with patient regarding BP  Recent Office Vitals: BP Readings from Last 3 Encounters:  07/02/21 (!) 144/78  06/20/21 130/68  05/20/21 130/70   Pulse Readings from Last 3 Encounters:  07/02/21 71  06/20/21 95  05/20/21 (!) 58    Wt Readings from Last 3 Encounters:  07/02/21 237 lb 6.4 oz (107.7 kg)  06/20/21 240 lb (108.9 kg)  05/20/21 235 lb 12.8 oz (107 kg)     Kidney Function Lab Results  Component Value Date/Time   CREATININE 1.25 04/18/2021 03:42 PM   CREATININE 1.28 (H) 03/28/2021 10:56 AM   CREATININE 1.07 05/03/2013 09:59 AM   GFR 56.84 (L) 02/27/2021 09:23 AM   GFRNONAA >60 02/18/2021 01:00 AM   GFRAA >60 03/31/2020 02:55 PM    BMP Latest Ref Rng & Units 04/18/2021 03/28/2021 02/27/2021  Glucose 70 - 99 mg/dL 99 79 139(H)  BUN 8 - 27 mg/dL 19 21 20  Creatinine 0.76 - 1.27 mg/dL 1.25 1.28(H) 1.27  BUN/Creat Ratio 10 - $Re'24 15 16 'Zca$ -  Sodium 134 - 144 mmol/L 144 140 140  Potassium 3.5 - 5.2 mmol/L 3.9 4.0 3.6  Chloride 96 - 106 mmol/L 105 100 102  CO2 20 - 29 mmol/L $RemoveB'24 23 29  'ylrGPFlz$ Calcium 8.6 - 10.2 mg/dL 9.5 9.5 9.6    Recent Relevant Labs: Lab Results  Component Value Date/Time   HGBA1C 5.6 05/20/2021 01:50 PM   HGBA1C 11.5 (H) 02/15/2021 04:58 AM   HGBA1C 6.8 (A) 09/16/2020 01:42 PM   HGBA1C 6.3 05/16/2020 03:53 PM   MICROALBUR 1.8 07/18/2019 03:00 PM    Kidney Function Lab Results  Component Value Date/Time   CREATININE 1.25 04/18/2021 03:42 PM   CREATININE 1.28 (H) 03/28/2021 10:56 AM   CREATININE  1.07 05/03/2013 09:59 AM   GFR 56.84 (L) 02/27/2021 09:23 AM   GFRNONAA >60 02/18/2021 01:00 AM   GFRAA >60 03/31/2020 02:55 PM    Current antihyperglycemic regimen:  Farxiga 10 mg Humalog 75/25 mix - 22 units BID Metformin 1000 mg  What recent interventions/DTPs have been made to improve glycemic control:  Continue current medications  Have there been any recent hospitalizations or ED visits since last visit with CPP? No  Patient denies hypoglycemic symptoms, including None Patient reports hyperglycemic symptoms, including weakness, fatigue, dizziness  How often are you checking your blood sugar? once daily  What are your blood sugars ranging? 90-145 Fasting: 99 this morning  During the week, how often does your blood glucose drop below 70?  Patient states the lowest it has been is about 32, and he felt a little light headed  Are you checking your feet daily/regularly? Patient states that he has not problems at this time with his feet  Adherence Review: Is the patient currently on a STATIN medication? Yes Is the patient currently on ACE/ARB medication? Yes Does the patient have >5 day gap between last estimated fill dates? No    Care Gaps: Colonoscopy-11/10/18 Diabetic Foot Exam-05/20/21 Ophthalmology-04/21/21 Dexa Scan - NA Annual Well Visit - NA Micro albumin-NA Hemoglobin A1c- 05/20/21  Star Rating Drugs: Atorvastatin 40 mg-last fill 06/07/21 90 ds Metformin 1000 mg-last fill 06/22/21 90 ds Entresto 97-103 mg-last fill 06/30/21 90 ds  Ethelene Hal Clinical Pharmacist Assistant [phone]

## 2021-08-18 ENCOUNTER — Ambulatory Visit (INDEPENDENT_AMBULATORY_CARE_PROVIDER_SITE_OTHER): Payer: Medicare Other

## 2021-08-18 DIAGNOSIS — I255 Ischemic cardiomyopathy: Secondary | ICD-10-CM

## 2021-08-19 ENCOUNTER — Ambulatory Visit (INDEPENDENT_AMBULATORY_CARE_PROVIDER_SITE_OTHER): Payer: Medicare Other

## 2021-08-19 DIAGNOSIS — I5022 Chronic systolic (congestive) heart failure: Secondary | ICD-10-CM

## 2021-08-19 DIAGNOSIS — Z95810 Presence of automatic (implantable) cardiac defibrillator: Secondary | ICD-10-CM | POA: Diagnosis not present

## 2021-08-19 LAB — CUP PACEART REMOTE DEVICE CHECK
Battery Remaining Longevity: 84 mo
Battery Voltage: 2.99 V
Brady Statistic RV Percent Paced: 0.27 %
Date Time Interrogation Session: 20230214022605
HighPow Impedance: 75 Ohm
Implantable Lead Implant Date: 20181102
Implantable Lead Location: 753860
Implantable Pulse Generator Implant Date: 20181102
Lead Channel Impedance Value: 437 Ohm
Lead Channel Impedance Value: 551 Ohm
Lead Channel Pacing Threshold Amplitude: 0.5 V
Lead Channel Pacing Threshold Pulse Width: 0.4 ms
Lead Channel Sensing Intrinsic Amplitude: 5.75 mV
Lead Channel Sensing Intrinsic Amplitude: 5.75 mV
Lead Channel Setting Pacing Amplitude: 2 V
Lead Channel Setting Pacing Pulse Width: 0.4 ms
Lead Channel Setting Sensing Sensitivity: 0.3 mV

## 2021-08-21 NOTE — Progress Notes (Signed)
Remote ICD transmission.   

## 2021-08-21 NOTE — Progress Notes (Signed)
EPIC Encounter for ICM Monitoring  Patient Name: Christopher [name] is a 71 y.o. male Date: 08/21/2021 Primary Care Physican: Hoyt Koch, MD Primary Cardiologist: [facility] Electrophysiologist: Allred 08/21/2021 Weight: 232 lbs                             Spoke with patient and heart failure questions reviewed.  Pt asymptomatic for fluid accumulation.  Reports feeling well at this time and voices no complaints.    Optivol thoracic impedance suggesting normal fluid levels.   Spironolactone 25 mg take 0.5 tablet (12.5 total) daily   Labs: 04/18/2021 Creatinine 1.25, BUN 19, Potassium 3.9, Sodium 144, GFR 62 03/28/2021 Creatinine 1.28, BUN 21, Potassium 4.0, Sodium 140, GFR 60  A complete set of results can be found in Results Review.   Recommendations:  No changes and encouraged to call if experiencing any fluid symptoms.   Follow-up plan: ICM clinic phone appointment on 09/22/2021.   91 day device clinic remote transmission 11/17/2021.      EP/Cardiology Office Visits:  12/29/2021 with Dr Debara Pickett.  Recall 12/28/2020 with Dr Rayann Heman.     Copy of ICM check sent to Dr. Rayann Heman.    3 month ICM trend: 08/19/2021.    12-14 Month ICM trend:     Rosalene Billings, RN 08/21/2021 12:42 PM

## 2021-08-25 ENCOUNTER — Telehealth: Payer: Self-pay | Admitting: Internal Medicine

## 2021-08-25 NOTE — Telephone Encounter (Signed)
LVM for pot to rtn my call to schedule AWV with [facility]. Please schedule appt if pt calls the office.

## 2021-09-04 ENCOUNTER — Encounter: Payer: Self-pay | Admitting: Internal Medicine

## 2021-09-04 DIAGNOSIS — D1801 Hemangioma of skin and subcutaneous tissue: Secondary | ICD-10-CM | POA: Diagnosis not present

## 2021-09-04 DIAGNOSIS — D2272 Melanocytic nevi of left lower limb, including hip: Secondary | ICD-10-CM | POA: Diagnosis not present

## 2021-09-04 DIAGNOSIS — D0359 Melanoma in situ of other part of trunk: Secondary | ICD-10-CM | POA: Diagnosis not present

## 2021-09-04 DIAGNOSIS — D2271 Melanocytic nevi of right lower limb, including hip: Secondary | ICD-10-CM | POA: Diagnosis not present

## 2021-09-04 DIAGNOSIS — D2372 Other benign neoplasm of skin of left lower limb, including hip: Secondary | ICD-10-CM | POA: Diagnosis not present

## 2021-09-04 DIAGNOSIS — D225 Melanocytic nevi of trunk: Secondary | ICD-10-CM | POA: Diagnosis not present

## 2021-09-04 DIAGNOSIS — L570 Actinic keratosis: Secondary | ICD-10-CM | POA: Diagnosis not present

## 2021-09-04 DIAGNOSIS — L821 Other seborrheic keratosis: Secondary | ICD-10-CM | POA: Diagnosis not present

## 2021-09-08 MED ORDER — SPIRONOLACTONE 25 MG PO TABS: 12.5000 mg | ORAL_TABLET | Freq: Every day | ORAL | 1 refills | Status: DC

## 2021-09-16 ENCOUNTER — Ambulatory Visit (INDEPENDENT_AMBULATORY_CARE_PROVIDER_SITE_OTHER): Payer: Medicare Other

## 2021-09-16 DIAGNOSIS — I2510 Atherosclerotic heart disease of native coronary artery without angina pectoris: Secondary | ICD-10-CM

## 2021-09-16 DIAGNOSIS — I5022 Chronic systolic (congestive) heart failure: Secondary | ICD-10-CM

## 2021-09-16 DIAGNOSIS — I10 Essential (primary) hypertension: Secondary | ICD-10-CM

## 2021-09-16 DIAGNOSIS — E118 Type 2 diabetes mellitus with unspecified complications: Secondary | ICD-10-CM

## 2021-09-16 NOTE — Patient Instructions (Signed)
Visit Information ? ?Following are the goals we discussed today:  ? ?Manage My Medicine  ? ?Timeframe:  Long-Range Goal ?Priority:  Medium ?Start Date:    02/24/21                         ?Expected End Date:   09/16/2021                  ? ?Follow Up Date August 2023 ?  ?- call for medicine refill 2 or 3 days before it runs out ?- call if I am sick and can't take my medicine ?- keep a list of all the medicines I take; vitamins and herbals too ?- use a pillbox to sort medicine  ?-Use [employer] grant for Lisabeth Register refills at [facility] ?  ?Why is this important?   ?These steps will help you keep on track with your medicines. ? ?Track and Manage My Blood Sugars  ? ?Timeframe:  Long-Range Goal ?Priority:  High ?Start Date:      02/24/21                       ?Expected End Date:  09/17/2022                  ? ?Follow Up Date  August 2023 ?  ?- check blood sugar at prescribed times ?- check blood sugar if I feel it is too high or too low ?- take the blood sugar meter to all doctor visits ?-Blood sugar < 70 is too low, if this occurs treat with 1/2 glass juice, regular soda or sugary snack then re-check sugar in 15 minutes  ?  ?Why is this important?   ?Checking your blood sugar at home helps to keep it from getting very high or very low.  ?Writing the results in a diary or log helps the doctor know how to care for you.  ?Your blood sugar log should have the time, date and the results.  ?Also, write down the amount of insulin or other medicine that you take.  ?Other information, like what you ate, exercise done and how you were feeling, will also be helpful.   ? ?Plan: Telephone follow up appointment with care management team member scheduled for:  6 months  ?The patient has been provided with contact information for the care management team and has been advised to call with any health related questions or concerns.  ? ?Tomasa Blase, PharmD ?Clinical Pharmacist, [facility]  ? ?Please call the care guide  team at [phone] if you need to cancel or reschedule your appointment.  ? ?Patient verbalizes understanding of instructions and care plan provided today and agrees to view in [place]. Active MyChart status confirmed with patient.   ? ?

## 2021-09-16 NOTE — Progress Notes (Signed)
? ?Chronic Care Management ?Pharmacy Note ? ?09/16/2021 ?Name:  Christopher [name] MRN:  [id number] DOB:  [birth year] ? ?Summary: ?-Patient reports that he has been doing well, denies any issues or concerns at this time ?-Continues to monitor BG at home, well controlled averaging 108-110's - reports rare occasion of hypoglycemic symptoms which are resolved by drinking juice  ?-Has ECHO scheduled in June and he will follow up with cardiology after this is completed  ? ?Recommendations/Changes made from today's visit: ?-Recommending no changes to medications at this time - receiving farxiga and entresto through [place] until 02/2022 - will renew when required ?-Patient to continue monitoring blood sugars daily - to reach out with any issues of uncontrolled BG  ? ?Subjective: ?Christopher [name] is an 71 y.o. year old male who is a primary patient of Hoyt Koch, MD.  The CCM team was consulted for assistance with disease management and care coordination needs.   ? ?Engaged with patient by telephone for follow up visit in response to provider referral for pharmacy case management and/or care coordination services.  ? ?Consent to Services:  ?The patient was given information about Chronic Care Management services, agreed to services, and gave verbal consent prior to initiation of services.  Please see initial visit note for detailed documentation.  ? ?Patient Care Team: ?Hoyt Koch, MD as PCP - General (Internal Medicine) ?Thompson Grayer, MD as PCP - Electrophysiology (Cardiology) ?Pixie Casino, MD as PCP - Cardiology (Cardiology) ?[name], MD as Attending Physician (Cardiology) ?[name], MD (Inactive) as Attending Physician (Cardiothoracic Surgery) ?Deneise Lever, MD as Attending Physician (Pulmonary Disease) ?Sherren Mocha, MD as Attending Physician (Cardiology) ?Juanita Craver, MD as Consulting Physician (Gastroenterology) ?Thompson Grayer, MD (Cardiology) ?Foltanski, Cleaster Corin, [facility] as Pharmacist (Pharmacist) ? ?Recent office visits: ?05/20/2021 - Dr. Sharlet Salina - insulin 75/25 decreased to 20 units BID  ? ?Recent consult visits: ?07/02/2021 - Dr. Debara Pickett - Cardiology - no changes to medications  - f/u in 6 months  ?06/20/2021 - Dr. Rayann Heman  - cardiology - no changes to medications - f/u in 1 year  ? ?Hospital visits: ?None since last visit  ? ? ?Objective: ? ?Lab Results  ?Component Value Date  ? CREATININE 1.25 04/18/2021  ? BUN 19 04/18/2021  ? GFR 56.84 (L) 02/27/2021  ? GFRNONAA >60 02/18/2021  ? GFRAA >60 03/31/2020  ? NA 144 04/18/2021  ? K 3.9 04/18/2021  ? CALCIUM 9.5 04/18/2021  ? CO2 24 04/18/2021  ? GLUCOSE 99 04/18/2021  ? ? ?Lab Results  ?Component Value Date/Time  ? HGBA1C 5.6 05/20/2021 01:50 PM  ? HGBA1C 11.5 (H) 02/15/2021 04:58 AM  ? HGBA1C 6.8 (A) 09/16/2020 01:42 PM  ? HGBA1C 6.3 05/16/2020 03:53 PM  ? FRUCTOSAMINE 246 01/13/2018 10:29 AM  ? GFR 56.84 (L) 02/27/2021 09:23 AM  ? GFR 76.13 05/16/2020 03:53 PM  ? MICROALBUR 1.8 07/18/2019 03:00 PM  ?  ?Last diabetic Eye exam:  ?Lab Results  ?Component Value Date/Time  ? HMDIABEYEEXA No Retinopathy 04/21/2021 12:00 AM  ?  ?Last diabetic Foot exam: No results found for: HMDIABFOOTEX  ? ?Lab Results  ?Component Value Date  ? CHOL 125 02/15/2021  ? HDL 30 (L) 02/15/2021  ? [place] 34 02/15/2021  ? LDLDIRECT 52.0 07/18/2019  ? TRIG 303 (H) 02/15/2021  ? CHOLHDL 4.2 02/15/2021  ? ? ?Hepatic Function Latest Ref Rng & Units 02/27/2021 05/16/2020 07/18/2019  ?Total Protein 6.0 - 8.3 g/dL 6.9 6.9 6.9  ?  Albumin 3.5 - 5.2 g/dL 4.2 4.3 4.2  ?AST 0 - 37 U/L _0 ?ALT 0 - 53 U/L _1 ?Alk Phosphatase 39 - 117 U/L 44 41 65  ?Total Bilirubin 0.2 - 1.2 mg/dL 0.8 0.6 0.6  ?Bilirubin, Direct 0.00 - 0.40 mg/dL - - -  ? ? ?Lab Results  ?Component Value Date/Time  ? TSH 3.780 01/03/2020 01:18 PM  ? TSH 1.966 05/05/2017 04:29 PM  ? TSH 2.75 11/01/2012 12:37 PM  ? ? ?CBC Latest Ref Rng & Units 02/27/2021 02/16/2021 02/14/2021  ?WBC 4.0 - 10.5  K/uL 6.1 6.3 5.7  ?Hemoglobin 13.0 - 17.0 g/dL 14.5 14.4 14.6  ?Hematocrit 39.0 - 52.0 % 43.1 41.3 42.1  ?Platelets 150.0 - 400.0 K/uL 159.0 147(L) 168  ? ? ?No results found for: VD25OH ? ?Clinical ASCVD: Yes  ?The ASCVD Risk score (Arnett DK, et al., 2019) failed to calculate for the following reasons: ?  The patient has a prior MI or stroke diagnosis   ? ?Depression screen [facility] 2/9 04/09/2021 09/05/2020 09/05/2020  ?Decreased Interest 0 0 0  ?Down, Depressed, Hopeless 0 0 0  ?PHQ - 2 Score 0 0 0  ?Some recent data might be hidden  ?  ? ?Social History  ? ?Tobacco Use  ?Smoking Status Former  ? Packs/day: 2.50  ? Years: 5.00  ? Pack years: 12.50  ? Types: Cigarettes  ? Quit date: 07/06/1978  ? Years since quitting: 43.2  ?Smokeless Tobacco Never  ?Tobacco Comments  ? social drinker  ? ?BP Readings from Last 3 Encounters:  ?07/02/21 (!) 144/78  ?06/20/21 130/68  ?05/20/21 130/70  ? ?Pulse Readings from Last 3 Encounters:  ?07/02/21 71  ?06/20/21 95  ?05/20/21 (!) 58  ? ?Wt Readings from Last 3 Encounters:  ?07/02/21 237 lb 6.4 oz (107.7 kg)  ?06/20/21 240 lb (108.9 kg)  ?05/20/21 235 lb 12.8 oz (107 kg)  ? ?BMI Readings from Last 3 Encounters:  ?07/02/21 34.06 kg/m?  ?06/20/21 34.44 kg/m?  ?05/20/21 33.83 kg/m?  ? ? ?Assessment/Interventions: Review of patient past medical history, allergies, medications, health status, including review of consultants reports, laboratory and other test data, was performed as part of comprehensive evaluation and provision of chronic care management services.  ? ?SDOH:  (Social Determinants of Health) assessments and interventions performed: Yes ? ?SDOH Screenings  ? ?Alcohol Screen: Not on file  ?Depression (PHQ2-9): Low Risk   ? PHQ-2 Score: 0  ?Financial Resource Strain: Not on file  ?Food Insecurity: Not on file  ?Housing: Not on file  ?Physical Activity: Not on file  ?Social Connections: Not on file  ?Stress: Not on file  ?Tobacco Use: Medium Risk  ? Smoking Tobacco Use: Former  ?  Smokeless Tobacco Use: Never  ? Passive Exposure: Not on file  ?Transportation Needs: Not on file  ? ? ?[place] ? ?Allergies  ?Allergen Reactions  ? Codeine Other (See Comments)  ?  Causes bad constipation  ? Dust Mite Extract Cough  ? Pollen Extract Cough  ? ? ?Medications Reviewed Today   ? ? Reviewed by Tomasa Blase, [facility] (Pharmacist) on 09/16/21 at [facility] List Status: <None>  ? ?Medication Order Taking? Sig Documenting Provider Last Dose Status Informant  ?acetaminophen (TYLENOL) 500 MG tablet [id number] Yes Take 500 mg by mouth every 6 (six) hours as needed for mild pain or headache.  [provider] Taking Active Self  ?atorvastatin (LIPITOR) 40 MG tablet [id number]  Yes TAKE 1 TABLET BY MOUTH  DAILY Hoyt Koch, MD Taking Active   ?blood glucose meter kit and supplies KIT [id number] Yes Dispense based on patient and insurance preference. Use up to four times daily as directed. Elmarie Shiley, MD Taking Active   ?         ?Med Note Richardson Landry, JASMINE N   Thu Mar 13, 2021  8:36 AM)    ?cetirizine (ZYRTEC) 10 MG tablet [id number] Yes Take 10 mg by mouth daily. [provider] Taking Active Self  ?cholecalciferol (VITAMIN D) 25 MCG (1000 UNIT) tablet [id number] Yes Take 1 tablet by mouth daily.  [provider] Taking Active Self  ?clopidogrel (PLAVIX) 75 MG tablet [id number] Yes Take 1 tablet (75 mg total) by mouth daily. Hoyt Koch, MD Taking Active   ?dapagliflozin propanediol (FARXIGA) 10 MG TABS tablet [id number] Yes Take 1 tablet (10 mg total) by mouth daily before breakfast. Almyra Deforest, PA Taking Active   ?         ?Med Note Luna Glasgow Mar 31, 2021 10:15 AM) [employer] grant thru 02/28/2022  ?fenofibrate (TRICOR) 145 MG tablet [id number] Yes TAKE 1 TABLET BY MOUTH  DAILY Hoyt Koch, MD Taking Active   ?fluticasone [facility]) 50 MCG/ACT nasal spray [id number] Yes USE 2 SPRAYS IN BOTH  NOSTRILS DAILY Hoyt Koch, MD  Taking Active   ?Insulin Lispro Prot & Lispro (HUMALOG MIX 75/25 KWIKPEN) (75-25) 100 UNIT/ML Claiborne Rigg [id number] Yes Inject 22 Units into the skin in the morning and at bedtime.  ?Patient taking differently: [place]

## 2021-09-22 ENCOUNTER — Ambulatory Visit (INDEPENDENT_AMBULATORY_CARE_PROVIDER_SITE_OTHER): Payer: Medicare Other

## 2021-09-22 DIAGNOSIS — I5022 Chronic systolic (congestive) heart failure: Secondary | ICD-10-CM | POA: Diagnosis not present

## 2021-09-22 DIAGNOSIS — Z95810 Presence of automatic (implantable) cardiac defibrillator: Secondary | ICD-10-CM | POA: Diagnosis not present

## 2021-09-26 NOTE — Progress Notes (Signed)
EPIC Encounter for ICM Monitoring ? ?Patient Name: Christopher [name] is a 71 y.o. male ?Date: 09/26/2021 ?Primary Care Physican: Hoyt Koch, MD ?Primary Cardiologist: Hilty ?Electrophysiologist: Allred ?08/21/2021 Weight: 232 lbs                           ?  ?Transmission reviewed.  ?  ?Optivol thoracic impedance suggesting normal fluid levels. ?  ?Spironolactone 25 mg take 0.5 tablet (12.5 total) daily ?  ?Labs: ?04/18/2021 Creatinine 1.25, BUN 19, Potassium 3.9, Sodium 144, GFR 62 ?03/28/2021 Creatinine 1.28, BUN 21, Potassium 4.0, Sodium 140, GFR 60  ?A complete set of results can be found in Results Review. ?  ?Recommendations:  No changes. ?  ?Follow-up plan: ICM clinic phone appointment on 10/27/2021.   91 day device clinic remote transmission 11/17/2021.    ?  ?EP/Cardiology Office Visits:  12/29/2021 with Dr Debara Pickett.  Recall 12/28/2020 with Dr Rayann Heman.   ?  ?Copy of ICM check sent to Dr. Rayann Heman.   ? ?3 month ICM trend: 09/22/2021. ? ? ? ?12-14 Month ICM trend:  ? ? ? ?Rosalene Billings, RN ?09/26/2021 ?2:16 PM ? ?

## 2021-10-03 DIAGNOSIS — I2510 Atherosclerotic heart disease of native coronary artery without angina pectoris: Secondary | ICD-10-CM

## 2021-10-03 DIAGNOSIS — E118 Type 2 diabetes mellitus with unspecified complications: Secondary | ICD-10-CM

## 2021-10-03 DIAGNOSIS — I5022 Chronic systolic (congestive) heart failure: Secondary | ICD-10-CM | POA: Diagnosis not present

## 2021-10-03 DIAGNOSIS — I10 Essential (primary) hypertension: Secondary | ICD-10-CM | POA: Diagnosis not present

## 2021-10-09 ENCOUNTER — Encounter: Payer: Self-pay | Admitting: Adult Health

## 2021-10-09 ENCOUNTER — Ambulatory Visit: Payer: Medicare Other | Admitting: Adult Health

## 2021-10-09 VITALS — BP 126/76 | HR 70 | Ht 70.0 in | Wt 239.0 lb

## 2021-10-09 DIAGNOSIS — I639 Cerebral infarction, unspecified: Secondary | ICD-10-CM | POA: Diagnosis not present

## 2021-10-09 NOTE — Progress Notes (Signed)
?[name] ?[address] ?[place]. [place] [address] ?[phone] ? ?     STROKE FOLLOW UP NOTE ? ?[name] ?Date of Birth:  12/02/1950 ?Medical Record Number:  [id number]  ? ?Reason for Referral: stroke follow up ? ? ? ?SUBJECTIVE: ? ? ?CHIEF COMPLAINT:  ?Chief Complaint  ?Patient presents with  ? Follow-up  ?  Rm 3 alone ?Pt is well and stable, has been doing good. no new concerns   ? ? ?HPI:  ? ?Update 10/09/2021 JM: Patient returns for 50-monthstroke follow-up unaccompanied.  Overall stable without new or reoccurring stroke/TIA symptoms. [facility]. Reports improvement of his stamina and less DOE. [place] [facility] [place]  Compliant on Plavix and atorvastatin, denies side effects.  Blood pressure today 126/76.  Glucose levels stable with prior A1c 5.6 down from 11.5, denies any low BG readings.  Followed/managed by PCP with follow-up visit scheduled next month.  Closely followed by cardiology with repeat echo EF 40 to 45% showing improvement from prior EF 30 to 35%.  No new concerns at this time. ? ? ? ?History provided for reference purposes only ?Initial visit 04/24/2021 JM: Christopher [name] being seen for hospital follow-up unaccompanied.  Overall doing well from stroke standpoint.  Denies residual diplopia or vertigo.  Denies new stroke/TIA symptoms.  Completed 3 weeks DAPT -remains on Plavix alone as well as atorvastatin without side effects.  Blood pressure today 112/78. Glucose levels monitored which have been good - this AM reading 105.  Reports nightly use of CPAP for OSA management.  Has had follow-up with cardiology with medication adjustments and plans on repeat echo end of November.  No concerns at this time. ? ?Stroke admission 02/14/2021 ?Mr. Christopher SEBRINGis a 71y.o. male with history of CAD status post DES to RCA 2014, paroxysmal A. fib not on anticoagulation due to a history  of thigh hematoma, V. fib arrest status post ICD, obstructive sleep apnea on CPAP, GERD, hypertension, BPH, diabetes type 2, remote history of infective endocarditis complicated by mitral regurgitation s/p repair who presented on 02/14/2021 complaining of vertigo and diplopia.  Personally reviewed hospitalization pertinent progress notes, lab work and imaging.  Evaluated by Dr. 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  CTA head/neck negative LVO.  EF 30 to 35% (prior EF 55 to 60% 01/2020) with anterior apical and inferior apical wall motion abnormality suggestive of Takotsubo vs LAD territory wall motion abnormality.  Evaluated by cardiology  - started on Entresto and ACE inhibitor washout.  LDL 34.  A1c 11.5.  Recommended DAPT for 3 weeks and Plavix alone.  No prior stroke history.  Evaluated by therapies recommended home health PT/OT and discharged home in stable condition. ? ? ? ? ?PERTINENT IMAGING ? ?MR BRAIN 02/17/2021 ?IMPRESSION: ?Small acute infarct at the junction of the pons and midbrain on the ?left, which could potentially account for reported visual symptoms ? ?CTA HEAD/NECK 02/14/2021 ?IMPRESSION: ?CT HEAD IMPRESSION: ?1. No acute intracranial abnormality. ?2. Mild chronic microvascular ischemic disease with small remote ?lacunar infarct at the genu of the right internal capsule. ?  ?CTA HEAD AND NECK IMPRESSION: ?Normal CTA of the head and neck. No large vessel occlusion, ?hemodynamically significant stenosis, or other acute vascular ?abnormality. ? ?2D ECHO 02/15/2021 ?IMPRESSIONS  ? 1. 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 The EF  drop is  ?new. The wall motion abnormalities are new. Findings could represent  ?stress induced cardiomyopathy vs LAD infarction, clinical correlation is  ?recommended. Left ventricular  ?ejection fraction, by estimation, is 30 to 35%. The left ventricle has  ?moderately decreased function. The left ventricle demonstrates  regional  ?wall motion abnormalities (see scoring diagram/findings for description).  ?The left ventricular internal cavity  ?size was severely dilated. Left ventricular diastolic function could not  ?be evaluated.  ? 2. Right ventricular systolic function is normal. The right ventricular  ?size is normal. Tricuspid regurgitation signal is inadequate for assessing  ?PA pressure.  ? 3. The mitral valve has been repaired/replaced. No evidence of mitral  ?valve regurgitation. No evidence of mitral stenosis. There is a 30 mm  ?prosthetic annuloplasty ring present in the mitral position. Procedure  ?Date: 09/27/2012. Echo findings are  ?consistent with normal structure and function of the mitral valve  ?prosthesis.  ? 4. The aortic valve is tricuspid. Aortic valve regurgitation is trivial.  ?No aortic stenosis is present.  ? ? ? ? ?ROS:   ?14 system review of systems performed and negative with exception of no complaints ? ?PMH:  ?Past Medical History:  ?Diagnosis Date  ? Allergy   ? Arthritis   ? Atrial fibrillation ([place])   ? post op, intol of anticoag  ? Cardiac arrest [facility])   ? a. s/p MDT single chamber ICD; 11-10-18- pt denies having a heart attack  ? Chronic kidney disease   ? kidney stone  ? Coronary artery disease   ? a. 2/7 Cath: LM nl, LAD min irregs, LCX min irregs, RI 40, RCA 16m EF 55-60% basal to mid inf HK, 3-4+ MR;  b. 08/25/2012 PCI of RCA with 4.0x15 Vision BMS  ? Diabetes mellitus without complication ([place]   ? GERD (gastroesophageal reflux disease)   ? hx  ? Heart murmur   ? Hyperlipidemia   ? on statin  ? Hypertension   ? S/P mitral valve repair 09/27/2012  ? Complex valvuloplasty including triangular resection of flail posterior leaflet with 30 mm Sorin Memo 3D ring annuloplasty via right mini thoracotomy approach  ? Severe mitral regurgitation   ? a. Mitral valve prolapse with flail segment of posterior leaflet and severe MR by TEE, remote h/o bacterial endocarditis   ? Sleep apnea   ? NPSG 01/21/06-  AHI 40.7/hr cpap  ? Subacute bacterial endocarditis 03/22/2008  ? Strep viridans  ? Ventricular fibrillation ([place] 11/07/2019  ? appropriate shock (36J) for VF delivered  ? ? ?PSH:  ?Past Surgical History:  ?Procedure Laterality Date  ? AMPUTATION  07/28/2012  ? Procedure: AMPUTATION DIGIT;  Surgeon: PColin Rhein MD;  Location: [facility]S;  Service: Orthopedics;  Laterality: Right;  2nd toe  ? CARDIAC CATHETERIZATION    ? COLONOSCOPY    ? EP IMPLANTABLE DEVICE N/A 05/01/2016  ? Procedure: Loop Recorder Removal;  Surgeon: JThompson Grayer MD;  [facility];  Service: Cardiovascular;  Laterality: N/A;  ? FOOT SURGERY    ? BILATERAL TOES  ? ICD IMPLANT N/A 05/07/2017  ?  Medtronic Visia AF MRI VR SureScan implanted by Dr CCurt Bearsfollowing VF arrest  ? Implantable loop recorder placement  03/31/13  ? MDT Linq implanted by Dr ARayann Hemanto evaluate for further afib  ? INTRAOPERATIVE TRANSESOPHAGEAL ECHOCARDIOGRAM N/A 09/27/2012  ? Procedure: INTRAOPERATIVE TRANSESOPHAGEAL ECHOCARDIOGRAM;  Surgeon: CRexene Alberts MD;  [place] [facility]  Service: Open Heart Surgery;  Laterality: N/A;  ?  LEFT AND RIGHT HEART CATHETERIZATION WITH CORONARY ANGIOGRAM N/A 08/12/2012  ? Procedure: LEFT AND RIGHT HEART CATHETERIZATION WITH CORONARY ANGIOGRAM;  Surgeon: [name], MD;  Location: [facility] CATH LAB;  Service: Cardiovascular;  Laterality: N/A;  ? LEFT HEART CATH AND CORONARY ANGIOGRAPHY N/A 05/06/2017  ? Procedure: LEFT HEART CATH AND CORONARY ANGIOGRAPHY;  Surgeon: [name], MD;  Location: [facility] LAB;  Service: Cardiovascular;  Laterality: N/A;  ? LOOP RECORDER IMPLANT N/A 03/31/2013  ? Procedure: LOOP RECORDER IMPLANT;  Surgeon: [name], MD;  Location: [place] CATH LAB;  Service: Cardiovascular;  Laterality: N/A;  ? MITRAL VALVE REPAIR Right 09/27/2012  ? Procedure: MINIMALLY INVASIVE MITRAL VALVE REPAIR (MVR);  Surgeon: [name], MD;  Location: [place];  Service: Open Heart Surgery;  Laterality: Right;   Ultrasound guided  ? PERCUTANEOUS CORONARY STENT INTERVENTION (PCI-S) N/A 08/25/2012  ? Procedure: PERCUTANEOUS CORONARY STENT INTERVENTION (PCI-S);  Surgeon: Sherren Mocha, MD;  Location: [facility] CATH LAB;  Serv

## 2021-10-09 NOTE — Patient Instructions (Addendum)
Continue clopidogrel 75 mg daily  and atorvastatin for secondary stroke prevention ? ?Continue to follow with cardiology as advised ? ?Continue nightly use of CPAP for OSA management ? ?Continue to follow up with PCP regarding cholesterol, blood pressure and diabetes management  ?Maintain strict control of hypertension with blood pressure goal below 130/90, diabetes with hemoglobin A1c goal below 7.0 % and cholesterol with LDL cholesterol (bad cholesterol) goal below 70 mg/dL.  ? ?Signs of a Stroke? Follow the BEFAST method:  ?Balance Watch for a sudden loss of balance, trouble with coordination or vertigo ?Eyes Is there a sudden loss of vision in one or both eyes? Or double vision?  ?Face: Ask the person to smile. Does one side of the face droop or is it numb?  ?Arms: Ask the person to raise both arms. Does one arm drift downward? Is there weakness or numbness of a leg? ?Speech: Ask the person to repeat a simple phrase. Does the speech sound slurred/strange? Is the person confused ? ?Time: If you observe any of these signs, call 911. ? ? ? ? ? ? ? ?Thank you for coming to see [place] at [facility]. I hope we have been able to provide you high quality care today. ? ?You may receive a patient satisfaction survey over the next few weeks. We would appreciate your feedback and comments so that we may continue to improve ourselves and the health of our patients. ? ?

## 2021-10-14 ENCOUNTER — Telehealth: Payer: Self-pay

## 2021-10-14 ENCOUNTER — Other Ambulatory Visit: Payer: Self-pay | Admitting: Internal Medicine

## 2021-10-14 NOTE — Progress Notes (Signed)
? ? ?Chronic Care Management ?Pharmacy Assistant  ? ?Name: Christopher [name]  MRN: [id number] DOB: 1951-05-08 ? ? ?Reason for Encounter: Disease State ?  ?Conditions to be addressed/monitored: ?DMII ? ? ?Recent office visits:  ?None ID ? ?Recent consult visits:  ?10/09/21 Frann Rider, NP-Neurology (Ischemic stroke) F/u, no orders or med changes ? ?Hospital visits:  ?None since last coordination call ? ?Medications: ?Outpatient Encounter Medications as of 10/14/2021  ?Medication Sig Note  ? acetaminophen (TYLENOL) 500 MG tablet Take 500 mg by mouth every 6 (six) hours as needed for mild pain or headache.    ? atorvastatin (LIPITOR) 40 MG tablet TAKE 1 TABLET BY MOUTH  DAILY   ? blood glucose meter kit and supplies KIT Dispense based on patient and insurance preference. Use up to four times daily as directed.   ? cetirizine (ZYRTEC) 10 MG tablet Take 10 mg by mouth daily.   ? cholecalciferol (VITAMIN D) 25 MCG (1000 UNIT) tablet Take 1 tablet by mouth daily.    ? clopidogrel (PLAVIX) 75 MG tablet Take 1 tablet (75 mg total) by mouth daily.   ? dapagliflozin propanediol (FARXIGA) 10 MG TABS tablet Take 1 tablet (10 mg total) by mouth daily before breakfast. 03/31/2021: [employer] grant thru 02/28/2022  ? fenofibrate (TRICOR) 145 MG tablet TAKE 1 TABLET BY MOUTH  DAILY   ? fluticasone (FLONASE) 50 MCG/ACT nasal spray USE 2 SPRAYS IN BOTH  NOSTRILS DAILY   ? Insulin Lispro Prot & Lispro (HUMALOG MIX 75/25 KWIKPEN) (75-25) 100 UNIT/ML Kwikpen Inject 22 Units into the skin in the morning and at bedtime. (Patient taking differently: Inject 20 Units into the skin in the morning and at bedtime.)   ? metFORMIN (GLUCOPHAGE) 1000 MG tablet TAKE 1 TABLET BY MOUTH  TWICE DAILY WITH A MEAL   ? metoprolol succinate (TOPROL-XL) 100 MG 24 hr tablet TAKE 1 TABLET BY MOUTH  DAILY IMMEDIATELY FOLLOWING A MEAL   ? OVER THE COUNTER MEDICATION 1 tablet in the morning and at bedtime. [facility] - Restore multivitamin   ? sacubitril-valsartan  (ENTRESTO) 97-103 MG Take 1 tablet by mouth 2 (two) times daily.   ? spironolactone (ALDACTONE) 25 MG tablet Take 0.5 tablets (12.5 mg total) by mouth daily.   ? tamsulosin (FLOMAX) 0.4 MG CAPS capsule TAKE 1 CAPSULE BY MOUTH  DAILY   ? ?No facility-administered encounter medications on file as of 10/14/2021.  ? ?Recent Relevant Labs: ?Lab Results  ?Component Value Date/Time  ? HGBA1C 5.6 05/20/2021 01:50 PM  ? HGBA1C 11.5 (H) 02/15/2021 04:58 AM  ? HGBA1C 6.8 (A) 09/16/2020 01:42 PM  ? HGBA1C 6.3 05/16/2020 03:53 PM  ? MICROALBUR 1.8 07/18/2019 03:00 PM  ?  ?Kidney Function ?Lab Results  ?Component Value Date/Time  ? CREATININE 1.25 04/18/2021 03:42 PM  ? CREATININE 1.28 (H) 03/28/2021 10:56 AM  ? CREATININE 1.07 05/03/2013 09:59 AM  ? GFR 56.84 (L) 02/27/2021 09:23 AM  ? GFRNONAA >60 02/18/2021 01:00 AM  ? GFRAA >60 03/31/2020 02:55 PM  ? ? ?Current antihyperglycemic regimen:  ?Farxiga 10 mg ?Humalog 75/25 mix - 20 units BID ?Metformin 1000 mg ? ?What recent interventions/DTPs have been made to improve glycemic control:  ?None noted since last coordination call ? ?Have there been any recent hospitalizations or ED visits since last visit with CPP? No, not since last coordination call ? ?Patient denies hypoglycemic symptoms, including None ? ?Patient denies hyperglycemic symptoms, including none ? ?How often are you checking your blood sugar? once  daily ? ?What are your blood sugars ranging? Patient states that his blood sugars range between 110-130 ?Fasting: 84 this morning but yesterday it was 134 ? ?During the week, how often does your blood glucose drop below 70? Never ? ?Are you checking your feet daily/regularly? Patient states that he is not having any problems with feet ? ?Adherence Review: ?Is the patient currently on a STATIN medication? Yes ?Is the patient currently on ACE/ARB medication? Yes ?Does the patient have >5 day gap between last estimated fill dates? No ? ?Care Gaps: ?Colonoscopy-11/10/18 ?Diabetic  Foot Exam-05/20/21 ?Ophthalmology-04/21/21 ?Dexa Scan - NA ?Annual Well Visit - NA ?Micro albumin-NA ?Hemoglobin A1c- 05/20/21 ?  ?Star Rating Drugs: ?Atorvastatin 40 mg-last fill 08/25/21 90 ds ?Metformin 1000 mg-last fill 09/13/21 90 ds ?Entresto 97-103 mg-last fill 09/27/21 90 ds ?  ?Ethelene Hal ?Clinical Pharmacist Assistant ?[phone]  ?  ?

## 2021-10-20 DIAGNOSIS — D0359 Melanoma in situ of other part of trunk: Secondary | ICD-10-CM | POA: Diagnosis not present

## 2021-10-20 DIAGNOSIS — L988 Other specified disorders of the skin and subcutaneous tissue: Secondary | ICD-10-CM | POA: Diagnosis not present

## 2021-10-27 ENCOUNTER — Ambulatory Visit (INDEPENDENT_AMBULATORY_CARE_PROVIDER_SITE_OTHER): Payer: Medicare Other

## 2021-10-27 DIAGNOSIS — Z95810 Presence of automatic (implantable) cardiac defibrillator: Secondary | ICD-10-CM | POA: Diagnosis not present

## 2021-10-27 DIAGNOSIS — I5022 Chronic systolic (congestive) heart failure: Secondary | ICD-10-CM

## 2021-10-30 NOTE — Progress Notes (Signed)
EPIC Encounter for ICM Monitoring ? ?Patient Name: Christopher [name] is a 71 y.o. male ?Date: 10/30/2021 ?Primary Care Physican: Hoyt Koch, MD ?Primary Cardiologist: Hilty ?Electrophysiologist: Allred ?10/30/2021 Weight: 238 lbs (not weighing at home)                          ?  ?Spoke with patient and heart failure questions reviewed.  Pt asymptomatic for fluid accumulation.  [facility]ues [facility] at [facility]. ?  ?Optivol thoracic impedance suggesting normal fluid levels. ?  ?Spironolactone 25 mg take 0.5 tablet (12.5 total) daily ?  ?Labs: ?04/18/2021 Creatinine 1.25, BUN 19, Potassium 3.9, Sodium 144, GFR 62 ?03/28/2021 Creatinine 1.28, BUN 21, Potassium 4.0, Sodium 140, GFR 60  ?A complete set of results can be found in Results Review. ?  ?Recommendations:  No changes and encouraged to call if experiencing any fluid symptoms. ?  ?Follow-up plan: ICM clinic phone appointment on 12/02/2021.   91 day device clinic remote transmission 11/17/2021.    ?  ?EP/Cardiology Office Visits:  12/29/2021 with Dr Debara Pickett.  Recall 12/28/2020 with Dr Rayann Heman.   ?  ?Copy of ICM check sent to Dr. Rayann Heman.   ? ?3 month ICM trend: 10/27/2021. ? ? ? ?12-14 Month ICM trend:  ? ? ? ?Rosalene Billings, RN ?10/30/2021 ?12:48 PM ? ?

## 2021-11-03 DIAGNOSIS — L578 Other skin changes due to chronic exposure to nonionizing radiation: Secondary | ICD-10-CM | POA: Diagnosis not present

## 2021-11-03 DIAGNOSIS — L570 Actinic keratosis: Secondary | ICD-10-CM | POA: Diagnosis not present

## 2021-11-03 DIAGNOSIS — Z85820 Personal history of malignant melanoma of skin: Secondary | ICD-10-CM | POA: Diagnosis not present

## 2021-11-11 ENCOUNTER — Telehealth: Payer: Self-pay

## 2021-11-11 NOTE — Progress Notes (Signed)
? ? ?Chronic Care Management ?Pharmacy Assistant  ? ?Name: Christopher [name]  MRN: [id number] DOB: [birth year] ? ? ?Reason for Encounter: Disease State-Adherence  ?  ? ?Recent office visits:  ?None since the last coordination call 10/14/21 ? ?Recent consult visits:  ?None since the last coordination call 10/14/21 ? ?Hospital visits:  ?None since the last coordination call 10/14/21 ? ?Medications: ?Outpatient Encounter Medications as of 11/11/2021  ?Medication Sig Note  ? acetaminophen (TYLENOL) 500 MG tablet Take 500 mg by mouth every 6 (six) hours as needed for mild pain or headache.    ? atorvastatin (LIPITOR) 40 MG tablet TAKE 1 TABLET BY MOUTH  DAILY   ? blood glucose meter kit and supplies KIT Dispense based on patient and insurance preference. Use up to four times daily as directed.   ? cetirizine (ZYRTEC) 10 MG tablet Take 10 mg by mouth daily.   ? cholecalciferol (VITAMIN D) 25 MCG (1000 UNIT) tablet Take 1 tablet by mouth daily.    ? clopidogrel (PLAVIX) 75 MG tablet TAKE 1 TABLET BY MOUTH  DAILY   ? dapagliflozin propanediol (FARXIGA) 10 MG TABS tablet Take 1 tablet (10 mg total) by mouth daily before breakfast. 03/31/2021: [employer] grant thru 02/28/2022  ? fenofibrate (TRICOR) 145 MG tablet TAKE 1 TABLET BY MOUTH  DAILY   ? fluticasone (FLONASE) 50 MCG/ACT nasal spray USE 2 SPRAYS IN BOTH  NOSTRILS DAILY   ? Insulin Lispro Prot & Lispro (HUMALOG MIX 75/25 KWIKPEN) (75-25) 100 UNIT/ML Kwikpen Inject 22 Units into the skin in the morning and at bedtime. (Patient taking differently: Inject 20 Units into the skin in the morning and at bedtime.)   ? metFORMIN (GLUCOPHAGE) 1000 MG tablet TAKE 1 TABLET BY MOUTH  TWICE DAILY WITH A MEAL   ? metoprolol succinate (TOPROL-XL) 100 MG 24 hr tablet TAKE 1 TABLET BY MOUTH  DAILY IMMEDIATELY FOLLOWING A MEAL   ? OVER THE COUNTER MEDICATION 1 tablet in the morning and at bedtime. [facility] - Restore multivitamin   ? sacubitril-valsartan (ENTRESTO) 97-103 MG Take 1 tablet by mouth  2 (two) times daily.   ? spironolactone (ALDACTONE) 25 MG tablet Take 0.5 tablets (12.5 mg total) by mouth daily.   ? tamsulosin (FLOMAX) 0.4 MG CAPS capsule TAKE 1 CAPSULE BY MOUTH  DAILY   ? ?No facility-administered encounter medications on file as of 11/11/2021.  ? ?[place] for General Review Call ? ? ?Chart Review: ? ?Have there been any documented new, changed, or discontinued medications since last visit? No (If yes, include name, dose, frequency, date) ?Has there been any documented recent hospitalizations or ED visits since last visit with Clinical Pharmacist? No ?Brief Summary (including medication and/or Diagnosis changes): ? ? ?Adherence Review: ? ?Does the Clinical Pharmacist Assistant have access to adherence rates? Yes ?Adherence rates for STAR metric medications (List medication(s)/day supply/ last 2 fill dates). ?Adherence rates for medications indicated for disease state being reviewed (List medication(s)/day supply/ last 2 fill dates). ?Does the patient have >5 day gap between last estimated fill dates for any of the above medications or other medication gaps? No ?Reason for medication gaps. ? ? ?Disease State Questions: ? ?Able to connect with Patient? Yes ?Did patient have any problems with their health recently? No ?Note problems and Concerns: ?Have you had any admissions or emergency room visits or worsening of your condition(s) since last visit? No ?Details of ED visit, hospital visit and/or worsening condition(s): ?Have you had any visits  with new specialists or providers since your last visit? No ?Explain: ?Have you had any new health care problem(s) since your last visit? No ?New problem(s) reported: ?Have you run out of any of your medications since you last spoke with clinical pharmacist? No, but patient believes it is time for him to renew his farxiga and entresto from the manufacturer ?What caused you to run out of your medications? ?Are there any medications you are not  taking as prescribed? No ?What kept you from taking your medications as prescribed? ?Are you having any issues or side effects with your medications? No ?Note of issues or side effects: ?Do you have any other health concerns or questions you want to discuss with your Clinical Pharmacist before your next visit? No ?Note additional concerns and questions from Patient. ?Are there any health concerns that you feel we can do a better job addressing? No ?Note Patient's response. ?Are you having any problems with any of the following since the last visit: (select all that apply) ? None ? Details: ?12. Any falls since last visit? No ? Details: ?13. Any increased or uncontrolled pain since last visit? No ? Details: ?14. Next visit Type: telephone ?      Visit with:Clinical Pharmacist Dan ?       Date:02/19/22 ?       Time:1 pm ? ?15. Additional Details? No ? ?Care Gaps: ?Colonoscopy-11/10/18 ?Diabetic Foot Exam-05/20/21 ?Ophthalmology-04/21/21 ?Dexa Scan - NA ?Annual Well Visit - NA ?Micro albumin-NA ?Hemoglobin A1c- 05/20/21 ?  ?Star Rating Drugs: ?Atorvastatin 40 mg-last fill 08/25/21 90 ds ?Metformin 1000 mg-last fill 09/13/21 90 ds ?Entresto 97-103 mg-last fill 09/27/21 90 ds ?  ?Ethelene Hal ?Clinical Pharmacist Assistant ?[phone]   ?

## 2021-11-14 ENCOUNTER — Encounter: Payer: Self-pay | Admitting: *Deleted

## 2021-11-14 DIAGNOSIS — Z006 Encounter for examination for normal comparison and control in clinical research program: Secondary | ICD-10-CM

## 2021-11-14 NOTE — Patient Instructions (Signed)
Spoke with Christopher [name] he states he will coe in for an appointment. Scheduled appointment for May 24 at 1100 per his request.  ?

## 2021-11-17 ENCOUNTER — Ambulatory Visit (INDEPENDENT_AMBULATORY_CARE_PROVIDER_SITE_OTHER): Payer: Medicare Other

## 2021-11-17 DIAGNOSIS — I255 Ischemic cardiomyopathy: Secondary | ICD-10-CM

## 2021-11-18 ENCOUNTER — Encounter: Payer: Self-pay | Admitting: Internal Medicine

## 2021-11-18 ENCOUNTER — Ambulatory Visit (INDEPENDENT_AMBULATORY_CARE_PROVIDER_SITE_OTHER): Payer: Medicare Other | Admitting: Internal Medicine

## 2021-11-18 VITALS — BP 124/70 | HR 83 | Resp 18 | Ht 70.0 in | Wt 234.0 lb

## 2021-11-18 DIAGNOSIS — I5022 Chronic systolic (congestive) heart failure: Secondary | ICD-10-CM

## 2021-11-18 DIAGNOSIS — E118 Type 2 diabetes mellitus with unspecified complications: Secondary | ICD-10-CM | POA: Diagnosis not present

## 2021-11-18 DIAGNOSIS — I480 Paroxysmal atrial fibrillation: Secondary | ICD-10-CM | POA: Diagnosis not present

## 2021-11-18 DIAGNOSIS — I10 Essential (primary) hypertension: Secondary | ICD-10-CM | POA: Diagnosis not present

## 2021-11-18 DIAGNOSIS — Z0000 Encounter for general adult medical examination without abnormal findings: Secondary | ICD-10-CM

## 2021-11-18 LAB — CBC
HCT: 47.5 % (ref 39.0–52.0)
Hemoglobin: 15.8 g/dL (ref 13.0–17.0)
MCHC: 33.2 g/dL (ref 30.0–36.0)
MCV: 88.2 fl (ref 78.0–100.0)
Platelets: 187 10*3/uL (ref 150.0–400.0)
RBC: 5.39 Mil/uL (ref 4.22–5.81)
RDW: 15.3 % (ref 11.5–15.5)
WBC: 6.2 10*3/uL (ref 4.0–10.5)

## 2021-11-18 LAB — CUP PACEART REMOTE DEVICE CHECK
Battery Remaining Longevity: 81 mo
Battery Voltage: 2.98 V
Brady Statistic RV Percent Paced: 0.22 %
Date Time Interrogation Session: 20230516033325
HighPow Impedance: 73 Ohm
Implantable Lead Implant Date: 20181102
Implantable Lead Location: 753860
Implantable Pulse Generator Implant Date: 20181102
Lead Channel Impedance Value: 437 Ohm
Lead Channel Impedance Value: 551 Ohm
Lead Channel Pacing Threshold Amplitude: 0.5 V
Lead Channel Pacing Threshold Pulse Width: 0.4 ms
Lead Channel Sensing Intrinsic Amplitude: 5.75 mV
Lead Channel Sensing Intrinsic Amplitude: 5.75 mV
Lead Channel Setting Pacing Amplitude: 2 V
Lead Channel Setting Pacing Pulse Width: 0.4 ms
Lead Channel Setting Sensing Sensitivity: 0.3 mV

## 2021-11-18 LAB — COMPREHENSIVE METABOLIC PANEL
ALT: 19 U/L (ref 0–53)
AST: 22 U/L (ref 0–37)
Albumin: 4.4 g/dL (ref 3.5–5.2)
Alkaline Phosphatase: 42 U/L (ref 39–117)
BUN: 21 mg/dL (ref 6–23)
CO2: 29 mEq/L (ref 19–32)
Calcium: 9.6 mg/dL (ref 8.4–10.5)
Chloride: 101 mEq/L (ref 96–112)
Creatinine, Ser: 1.33 mg/dL (ref 0.40–1.50)
GFR: 53.5 mL/min — ABNORMAL LOW (ref >60.00)
Glucose, Bld: 95 mg/dL (ref 70–99)
Potassium: 3.8 mEq/L (ref 3.5–5.1)
Sodium: 138 mEq/L (ref 135–145)
Total Bilirubin: 0.6 mg/dL (ref 0.2–1.2)
Total Protein: 7.2 g/dL (ref 6.0–8.3)

## 2021-11-18 LAB — LIPID PANEL
Cholesterol: 122 mg/dL (ref 0–200)
HDL: 37.9 mg/dL — ABNORMAL LOW (ref >39.00)
LDL Cholesterol: 51 mg/dL (ref 0–99)
NonHDL: 83.88
Total CHOL/HDL Ratio: 3
Triglycerides: 165 mg/dL — ABNORMAL HIGH (ref 0.0–149.0)
VLDL: 33 mg/dL (ref 0.0–40.0)

## 2021-11-18 LAB — HEMOGLOBIN A1C: Hgb A1c MFr Bld: 5.7 % (ref 4.6–6.5)

## 2021-11-18 LAB — MICROALBUMIN / CREATININE URINE RATIO
Creatinine,U: 75.7 mg/dL
Microalb Creat Ratio: 4.9 mg/g (ref 0.0–30.0)
Microalb, Ur: 3.7 mg/dL — ABNORMAL HIGH (ref 0.0–1.9)

## 2021-11-18 NOTE — Progress Notes (Signed)
Subjective:   Patient ID: Christopher [name], male    DOB: 1951-01-01, 71 y.o.   MRN: [id number]  HPI Here for medicare wellness and physical, no new complaints. Please see A/P for status and treatment of chronic medical problems.   Diet: DM since diabetic Physical activity: sedentary Depression/mood screen: negative Hearing: intact to whispered voice, mild loss bilaterally Visual acuity: grossly normal, performs annual eye exam  ADLs: capable Fall risk: none Home safety: good Cognitive evaluation: intact to orientation, naming, recall and repetition EOL planning: adv directives discussed  [place] Office Visit from 11/18/2021 in [place] at [place] Office Visit from 11/18/2021 in [place] at [facility]  PHQ-9 Total Score 3         02/16/2021    8:00 PM 02/17/2021    8:30 AM 02/17/2021    9:15 PM 02/18/2021    8:00 AM 11/18/2021    1:06 PM  [place] in the past year?     0  Was there an injury with Fall?     0  Fall Risk Category Calculator     0  Fall Risk Category     Low  Patient Fall Risk Level Moderate fall risk Moderate fall risk Moderate fall risk Moderate fall risk     I have personally reviewed and have noted 1. The patient's medical and social history - reviewed today no changes 2. Their use of alcohol, tobacco or illicit drugs 3. Their current medications and supplements 4. The patient's functional ability including ADL's, fall risks, home safety risks and hearing or visual impairment. 5. Diet and physical activities 6. Evidence for depression or mood disorders 7. Care team reviewed and updated 8.  The patient is not on an opioid pain medication.  Patient Care Team: Hoyt Koch, MD as PCP - General (Internal Medicine) Thompson Grayer, MD as PCP - Electrophysiology (Cardiology) Debara Pickett Nadean Corwin, MD as PCP - Cardiology (Cardiology) [name], MD as Attending  Physician (Cardiology) [name], MD (Inactive) as Attending Physician (Cardiothoracic Surgery) Deneise Lever, MD as Attending Physician (Pulmonary Disease) Sherren Mocha, MD as Attending Physician (Cardiology) Juanita Craver, MD as Consulting Physician (Gastroenterology) Thompson Grayer, MD (Cardiology) Charlton Haws, [facility] as Pharmacist (Pharmacist) Past Medical History:  Diagnosis Date   Allergy    Arthritis    Atrial fibrillation [facility])    post op, intol of anticoag   Cardiac arrest [facility])    a. s/p MDT single chamber ICD; 11-10-18- pt denies having a heart attack   Chronic kidney disease    kidney stone   Coronary artery disease    a. 2/7 Cath: LM nl, LAD min irregs, LCX min irregs, RI 40, RCA 3m EF 55-60% basal to mid inf HK, 3-4+ MR;  b. 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 Mitral valve prolapse with flail segment of posterior leaflet and severe MR by TEE, remote h/o bacterial endocarditis    Sleep  apnea    NPSG 01/21/06- AHI 40.7/hr cpap   Subacute bacterial endocarditis 03/22/2008   Strep viridans   Ventricular fibrillation ([name]) 11/07/2019   appropriate shock (36J) for VF delivered   Past Surgical History:  Procedure Laterality Date   AMPUTATION  07/28/2012   Procedure: AMPUTATION DIGIT;  Surgeon: Colin Rhein, MD;  Location: [facility];  Service: Orthopedics;  Laterality: Right;  2nd toe   CARDIAC CATHETERIZATION     COLONOSCOPY     EP IMPLANTABLE DEVICE N/A 05/01/2016   Procedure: Loop Recorder Removal;  Surgeon: Thompson Grayer, MD;  Location: [place] CV LAB;   Service: Cardiovascular;  Laterality: N/A;   FOOT SURGERY     BILATERAL TOES   ICD IMPLANT N/A 05/07/2017    Medtronic Visia AF MRI VR SureScan implanted by Dr Curt Bears following VF arrest   Implantable loop recorder placement  03/31/13   MDT Linq implanted by Dr Rayann Heman to evaluate for further afib   INTRAOPERATIVE TRANSESOPHAGEAL ECHOCARDIOGRAM N/A 09/27/2012   Procedure: INTRAOPERATIVE TRANSESOPHAGEAL ECHOCARDIOGRAM;  Surgeon: [name], MD;  Location: [place];  Service: Open Heart Surgery;  Laterality: N/A;   LEFT AND RIGHT HEART CATHETERIZATION WITH CORONARY ANGIOGRAM N/A 08/12/2012   Procedure: LEFT AND RIGHT HEART CATHETERIZATION WITH CORONARY ANGIOGRAM;  Surgeon: [name], MD;  Location: [facility] CATH LAB;  Service: Cardiovascular;  Laterality: N/A;   LEFT HEART CATH AND CORONARY ANGIOGRAPHY N/A 05/06/2017   Procedure: LEFT HEART CATH AND CORONARY ANGIOGRAPHY;  Surgeon: [name], MD;  Location: [place] CV LAB;  Service: Cardiovascular;  Laterality: N/A;   LOOP RECORDER IMPLANT N/A 03/31/2013   Procedure: LOOP RECORDER IMPLANT;  Surgeon: [name], MD;  Location: [place] CATH LAB;  Service: Cardiovascular;  Laterality: N/A;   MITRAL VALVE REPAIR Right 09/27/2012   Procedure: MINIMALLY INVASIVE MITRAL VALVE REPAIR (MVR);  Surgeon: [name], MD;  Location: [place];  Service: Open Heart Surgery;  Laterality: Right;  Ultrasound guided   PERCUTANEOUS CORONARY STENT INTERVENTION (PCI-S) N/A 08/25/2012   Procedure: PERCUTANEOUS CORONARY STENT INTERVENTION (PCI-S);  Surgeon: Sherren Mocha, MD;  Location: [facility] CATH LAB;  Service: Cardiovascular;  Laterality: N/A;   SEPTOPLASTY     Dr. [name]   TEE WITHOUT CARDIOVERSION N/A 08/12/2012   Procedure: TRANSESOPHAGEAL ECHOCARDIOGRAM (TEE);  Surgeon: [name], MD;  Location: [facility] ENDOSCOPY;  Service: Cardiovascular;  Laterality: N/A;   TONSILLECTOMY     UVULOPALATOPHARYNGOPLASTY     Family History  Problem Relation Age of Onset    Heart disease Father    Colon cancer Father 31   Diabetes Father    Renal cancer Father    Diabetes Mother    Stroke Mother    Sudden death Brother    Heart attack Brother    Esophageal cancer Neg Hx    Rectal cancer Neg Hx    Stomach cancer Neg Hx    Review of Systems  Constitutional: Negative.   HENT: Negative.    Eyes: Negative.   Respiratory:  Negative for cough, chest tightness and shortness of breath.   Cardiovascular:  Negative for chest pain, palpitations and leg swelling.  Gastrointestinal:  Negative for abdominal distention, abdominal pain, constipation, diarrhea, nausea and vomiting.  Musculoskeletal: Negative.   Skin: Negative.   Neurological: Negative.   Psychiatric/Behavioral: Negative.     Objective:  Physical Exam Constitutional:      Appearance: He is well-developed.  HENT:     Head: Normocephalic and atraumatic.  Cardiovascular:  Rate and Rhythm: Normal rate and regular rhythm.  Pulmonary:     Effort: Pulmonary effort is normal. No respiratory distress.     Breath sounds: Normal breath sounds. No wheezing or rales.  Abdominal:     General: Bowel sounds are normal. There is no distension.     Palpations: Abdomen is soft.     Tenderness: There is no abdominal tenderness. There is no rebound.  Musculoskeletal:     Cervical back: Normal range of motion.  Skin:    General: Skin is warm and dry.  Neurological:     Mental Status: He is alert and oriented to person, place, and time.     Coordination: Coordination normal.    Vitals:   11/18/21 1304  BP: 124/70  Pulse: 83  Resp: 18  SpO2: 98%  Weight: 234 lb (106.1 kg)  Height: '5\' 10"'$  (1.778 m)    Assessment & Plan:

## 2021-11-18 NOTE — Patient Instructions (Signed)
We will check the labs today. 

## 2021-11-20 NOTE — Assessment & Plan Note (Signed)
BP at goal on metoprolol 100 mg daily and entresto 97/103 mg BID and spironolactone 12.5 mg daily. Checking CMP and adjust as needed.

## 2021-11-20 NOTE — Assessment & Plan Note (Signed)
Taking entresto 97/103 mg BID and spironolactone 12.5 mg daily. No flare today and weight is stable. Continue current medications.

## 2021-11-20 NOTE — Assessment & Plan Note (Signed)
Flu shot yearly. Covid-19 counseled. Pneumonia complete. Shingrix complete. Tetanus due advised to get at pharmacy. Colonoscopy due scheduled. Counseled about sun safety and mole surveillance. Counseled about the dangers of distracted driving. Given 10 year screening recommendations.

## 2021-11-20 NOTE — Assessment & Plan Note (Signed)
Sounds regular today but rate controlled. Taking metoprolol 100 mg daily and plavix for anticoagulation.

## 2021-11-20 NOTE — Assessment & Plan Note (Signed)
Foot exam done and checking HgA1c and lipid panel and microalbumin to creatinine ratio. Adjust as needed for goal 7.5%. Taking farxiga 10 mg daily and humalog 75/25 22 units BID and metformin 1000 mg BID.

## 2021-11-25 ENCOUNTER — Encounter: Payer: Self-pay | Admitting: *Deleted

## 2021-11-25 DIAGNOSIS — Z006 Encounter for examination for normal comparison and control in clinical research program: Secondary | ICD-10-CM

## 2021-11-25 NOTE — Research (Signed)
Message left for [name] to remind him of his appointment tomorrow at 1100. 

## 2021-11-26 ENCOUNTER — Other Ambulatory Visit: Payer: Self-pay

## 2021-11-26 ENCOUNTER — Encounter: Payer: Medicare Other | Admitting: *Deleted

## 2021-11-26 VITALS — BP 103/85 | HR 66 | Temp 97.3°F | Resp 16 | Ht 70.0 in | Wt 233.0 lb

## 2021-11-26 DIAGNOSIS — Z006 Encounter for examination for normal comparison and control in clinical research program: Secondary | ICD-10-CM

## 2021-11-26 NOTE — Research (Signed)
[facility]ence Consent    Subject Name: Christopher "Richardson Landry" [facility]  Subject met inclusion and exclusion criteria.  The informed consent form, study requirements and expectations were reviewed with the subject and questions and concerns were addressed prior to the signing of the consent form.  The subject verbalized understanding of the trial requirements.  The subject agreed to participate in the Core  trial and signed the informed consent at 1043 on 26-Nov-2021.  The informed consent was obtained prior to performance of any protocol-specific procedures for the subject.  A copy of the signed informed consent was given to the subject and a copy was placed in the subject's medical record.   Christopher [name]  Protocol number 1 Consent version  2   [facility]  [id number]    Site [id number]  SUBJECT ID:  [id number]                         DATE:    26-Nov-2021      [x]  MALE                            []  MALE AGE:71 ETHINICITY:   []  HISPANIC/LATINO      [x]  NON- HISPANIC/LATINO RACE:           [x]   WHITE             []  BLACK/AFRICAN AMERICAN                         []   ASIAN              []  AMERICAN INDIAN/[place] NATIVE                         []   NATIVE [place]/OTHER PACIFIC ISLANDER                         []   OTHER  FUTURE RESEARCH [x]  USE OF SAMPLES FOR FUTURE RESEARCH [x]  Consented for Sub study CTA  INCLUSION CRITERIA Consent      [x]    Pregnancy authorization []   AGE 72 or greater [x]   Triglycerides fasting 150 or greater with either: [x]   Dx of ASCVD (CAD, CVA, PAD) OR [x]   Increased risk for ASCVD as below []   Type 2 DM OR 2 or more below [x]   Men 1 or greater Woman 33 or greater [x]  []    Woman with Hx of preeclampsia or premature menopause (before 41) []    Family Hx of premature ASCDD (Before 46 for males, or before 24 for females  []    Current Tobacco use  []    Metabolic syndrome []    Hypertension with Treatment  [x]    CKD stage 3 Or (GFR 30-59)  []    LDL-C 160 or greater LDL-C  100 or greater on therapy to lower  []    Elevated high-sensitivity C Reactive protein (>2.0)  []   Elevated lipoprotein (a) (>50mg /dL or 124nmol/L) OR  []   Triglycerides fasting 500 or greater  []   Lipid-lowering med (for at least 4 weeks) [place] to comply with diet and lifestyle recommendations  []   Females must be non-pregnant and non-lactating and EITHER  []   Surgically sterile, post-menopausal, abstinent OR Use highly effective contraceptive at time of consent until at least 30 weeks after last dose of study drug  []   Males must be  surgical sterile, abstinent or using a highly effective contraceptive at time of consent until at least 30 weeks after the last dose of study drug   '[]'$     EXCLUSION CRITERIA           N/A                                            '[x]'$  Major surgery, peripheral revascularization, or non-urgent PCI within 3 months prior to screening, or planned major surgery or major procedure during the study $RemoveB'[]'ydetRvSe$   Active pancreatitis within 4 weeks prior to screening $RemoveBefo'[]'syWWAonnjvG$   Acute coronary syndrome or CVA/TIA within 3 months of screening $RemoveBefo'[]'YoaPGRbcuou$   Screening labs: ALT or AST >3.0 x ULN Total bilirubin >1.5 ULN unless due to Gilbert's syndrome GFR >30 Urine Protein/creatine ratio >500 Uncontrolled HTN (BP>180/100 despite Treatment Uncontrolled hypothyroidism TSH>1.5 and T4 < LLN, or Hormone therapy not stable for 4 weeks or greater  $Remove'[]'FYVAQeK$'[]'$'[]'$'[]'$'[]'$'[]'$   DM newly dx within 12 weeks of screening A1c > 9.5 at screening  $RemoveBe'[]'ASsqrIMou$'[]'$   Change in basal insulin >20% within 3 months prior to screening  $RemoveBe'[]'NzmqbVUmG$   Type 1 Dm: episode of DKA or > 3 episodes of severe hypo glycerides with on 6 months prior to screening   Active infections, HIV, Hep C, Hep B $Rem'[]'mlli$   Active infection requiring systemic antiviral or antimicrobial tx that will not be complete prior to study day 1 or active Covid 19 infection not resolved by study day 1 $Rem'[]'UkDx$   Malignancy within 5 years (except for non-melanoma skin ca, cervical  in situ ca, breast ductal ca in situ or stage 1 prostate Ca that has been tx $Remov'[]'bwQJBp$   Hypersensitivity to the active substance (olezarsen or placebo) $RemoveBef'[]'OBDiYtqGbi$   Tx with another investigational drug or devise within 1 month or screening  $RemoveBe'[]'TEdSyMtBO$   Previous tx with an oligonucleotide within 4 months of screening $RemoveBefo'[]'BVesSCUGRhP$   Con meds/ procedure restrictions:   Systemic corticosteroids of anabolic steroids within 6 weeks prior to screening and during the study unless approved   '[]'$   Use of bile acids resins (colestipol or Colesevelam) within 4 weeks prior to screening or planned during the study  $Remo'[]'jWWEs$   Plasma apheresis within 4 weeks prior to screening or planned during the study  $Remo'[]'HuTzQ$   Change in meds known to exacerbate hypertriglyceridemia (beta blockers, thiazides, isotretinoin, oral antidiabetic meds, tamoxifen, estrogens or progestins within 4 weeks prior to screening  $RemoveBe'[]'aWWlDsJFc$    Change or expected need for significant change in titration of therapies known to significantly reduce TG (GLP-1 agonists, other incretin mimetics, Phentermine/topiramate, naltrexone/bupropion, Xenical, or bariatric surgery within 3 months prior to screening  $RemoveBe'[]'AVhWKfUBW$    Change in antipsychotic meds within 30 days of screening or >473ml within 60 days of Screening  $RemoveBe'[]'bZaBykJnM$    Blood or plasma donation of 50-420ml within 30 days of screening or>499 within 60 days of screening $RemoveBefo'[]'knsQxxGwRQA$   Unwilling to comply with procedures, following up, or unwillingness to cooperate fully with the investigator $RemoveBeforeDE'[]'mHSviSVLtmtJjhd$   ETOH abuse or recent (<1 year) or other substance abuse $RemoveBeforeD'[]'GhWkeisxEOWGQt$    Christopher [name] here for run in visit for [facility] He was given time to ask questions and discuss the consent and the study before signing the consent. Signed at 1043. Dr Lia Foyer in to complete exam. EKG complete at 1114, VS taken at 1050, blood drawn at 1133, and urine obtained at  1139. He reports no abd pain,or any other pain, no visits to the ED or Urgent care.  Reviewed medications no changes noted.   Current  Outpatient Medications:    acetaminophen (TYLENOL) 500 MG tablet, Take 500 mg by mouth every 6 (six) hours as needed for mild pain or headache. , Disp: , Rfl:    atorvastatin (LIPITOR) 40 MG tablet, TAKE 1 TABLET BY MOUTH  DAILY, Disp: 90 tablet, Rfl: 3   blood glucose meter kit and supplies KIT, Dispense based on patient and insurance preference. Use up to four times daily as directed., Disp: 1 each, Rfl: 0   cetirizine (ZYRTEC) 10 MG tablet, Take 10 mg by mouth daily., Disp: , Rfl:    cholecalciferol (VITAMIN D) 25 MCG (1000 UNIT) tablet, Take 1 tablet by mouth daily. , Disp: , Rfl:    clopidogrel (PLAVIX) 75 MG tablet, TAKE 1 TABLET BY MOUTH  DAILY, Disp: 100 tablet, Rfl: 2   dapagliflozin propanediol (FARXIGA) 10 MG TABS tablet, Take 1 tablet (10 mg total) by mouth daily before breakfast., Disp: 30 tablet, Rfl: 11   fenofibrate (TRICOR) 145 MG tablet, TAKE 1 TABLET BY MOUTH  DAILY, Disp: 90 tablet, Rfl: 3   fluticasone (FLONASE) 50 MCG/ACT nasal spray, USE 2 SPRAYS IN BOTH  NOSTRILS DAILY, Disp: 48 g, Rfl: 1   Insulin Lispro Prot & Lispro (HUMALOG MIX 75/25 KWIKPEN) (75-25) 100 UNIT/ML Kwikpen, Inject 22 Units into the skin in the morning and at bedtime. (Patient taking differently: Inject 20 Units into the skin in the morning and at bedtime.), Disp: 15 mL, Rfl: 11   metFORMIN (GLUCOPHAGE) 1000 MG tablet, TAKE 1 TABLET BY MOUTH  TWICE DAILY WITH A MEAL, Disp: 180 tablet, Rfl: 3   metoprolol succinate (TOPROL-XL) 100 MG 24 hr tablet, TAKE 1 TABLET BY MOUTH  DAILY IMMEDIATELY FOLLOWING A MEAL, Disp: 90 tablet, Rfl: 3   OVER THE COUNTER MEDICATION, 1 tablet in the morning and at bedtime. [facility] - Restore multivitamin, Disp: , Rfl:    sacubitril-valsartan (ENTRESTO) 97-103 MG, Take 1 tablet by mouth 2 (two) times daily., Disp: 180 tablet, Rfl: 3   spironolactone (ALDACTONE) 25 MG tablet, Take 0.5 tablets (12.5 mg total) by mouth daily., Disp: 45 tablet, Rfl: 1   tamsulosin (FLOMAX) 0.4 MG CAPS  capsule, TAKE 1 CAPSULE BY MOUTH  DAILY, Disp: 90 capsule, Rfl: 3

## 2021-11-26 NOTE — Progress Notes (Signed)
Patient seen and evaluated for possible enrollment in [facility].  Followed by Dr. Debara Pickett. Prior RCA stenting with persistent elevation of triglycerides.  No active chest pain.  Protocol reviewed in detail with patient, including nature of drug, randomization, possible risks, volunteer status for trial.  Triglycerides have varied over time, possibly related to diabetes.  Recent A1C less than 6.   Has known prior AICD, sees Dr. Rayann Heman, and Dr. Debara Pickett.   VS as recorded. Lungs clear.  Minimal SEM. No edema.  Abdomen soft.  Ext no edema  ECG NSR 1 AVB.  Occasional VPBs.  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 No acute changes.   Patient wishes to participate and will begin roll in phase.   Addison Lank, MD, [facility], [place] Director, [facility]

## 2021-11-28 ENCOUNTER — Encounter: Payer: Self-pay | Admitting: *Deleted

## 2021-11-28 DIAGNOSIS — Z006 Encounter for examination for normal comparison and control in clinical research program: Secondary | ICD-10-CM

## 2021-11-28 NOTE — Research (Signed)
Spoke with Christopher [name], scheduled his next essence research appointment  (Qualification) for June 6 at 1100

## 2021-11-28 NOTE — Research (Addendum)
[place] screening run in 26-Nov-2021          ESS[facility] Abnormal Lab report 26-Nov-2021  Chemistry: Alkaline Phosphatase 37 U/L     '[]'$ Clinically Significant  '[x]'$ Not Clinically Significant   Hematology: RDW  14.9%                       '[]'$ Clinically Significant  '[x]'$ Not Clinically Significant   Urinalysis: Glucose  >1000 mg/dL     mg/dL        '[]'$ Clinically Significant  '[x]'$ Not Clinically Significant  Urine Chemistry: Albumin  Creatinine Ratio  22 mg/g   '[]'$ Clinically Significant  '[x]'$ Not Clinically Significant  Lipids:  Triglyceride     160  mg/dL              '[]'$ Clinically Significant  '[x]'$ Not Clinically Significant   Any further action needed to be taken per the PI? No   Pixie Casino, MD, [facility], [place] Director of the Advanced Lipid Disorders &  Cardiovascular Risk Reduction Clinic Diplomate of the American Board of Clinical Lipidology Attending Cardiologist  Direct Dial: [phone]  Fax: [phone]  Website:  [email or web address]

## 2021-11-28 NOTE — Research (Signed)
Christopher [name]  He states he takes Plavix for blood thinner started 08-23-12 Metformin for Type 2 DM started 11-16-17 Spironolactone for CHF started 02-14-21 Toprol-xl for Hypertension started 12-14-12 Flomax for Benign prostatic Hyperplasia started 01-28-15 Lipitor for Hyperlipidemia started 08-22-12 Entresto for CHF started 02-14-21 Flonase for allergy started 11-25-11 Tricor for hyperlipoidemia started 11-25-11 Farxiga for Type 2 Dm started 03-13-21 Humalog 75/25 for type 2 DM started 02-27-21 Tylenol as needed for pain started 10-10-12 Vit D for supplement started 07-18-12 Zyrtec for allergy started 12-10-10

## 2021-11-28 NOTE — Progress Notes (Unsigned)
Spoke with Mr Zelek scheduled his next visit with [facility] research for June 6 at 1100.

## 2021-12-02 ENCOUNTER — Encounter (HOSPITAL_COMMUNITY): Payer: Self-pay

## 2021-12-02 ENCOUNTER — Other Ambulatory Visit (HOSPITAL_COMMUNITY): Payer: Self-pay | Admitting: Emergency Medicine

## 2021-12-02 ENCOUNTER — Ambulatory Visit (INDEPENDENT_AMBULATORY_CARE_PROVIDER_SITE_OTHER): Payer: Medicare Other

## 2021-12-02 DIAGNOSIS — Z95810 Presence of automatic (implantable) cardiac defibrillator: Secondary | ICD-10-CM

## 2021-12-02 DIAGNOSIS — I5022 Chronic systolic (congestive) heart failure: Secondary | ICD-10-CM | POA: Diagnosis not present

## 2021-12-02 DIAGNOSIS — I2510 Atherosclerotic heart disease of native coronary artery without angina pectoris: Secondary | ICD-10-CM

## 2021-12-02 MED ORDER — METOPROLOL TARTRATE 100 MG PO TABS
100.0000 mg | ORAL_TABLET | Freq: Once | ORAL | 0 refills | Status: DC
Start: 2021-12-02 — End: 2022-11-20

## 2021-12-02 NOTE — Addendum Note (Signed)
Addended by: [name] on: 12/02/2021 01:24 PM   Modules accepted: Orders

## 2021-12-02 NOTE — Research (Signed)
[facility] St[facility] Screening run in visit wit [facility]ence

## 2021-12-04 ENCOUNTER — Telehealth (HOSPITAL_COMMUNITY): Payer: Self-pay | Admitting: *Deleted

## 2021-12-04 NOTE — Telephone Encounter (Signed)
Reaching out to patient to offer assistance regarding upcoming cardiac imaging study; pt verbalizes understanding of appt date/time, parking situation and where to check in, pre-test NPO status and medications ordered, and verified current allergies; name and call back number provided for further questions should they arise  Gordy Clement RN Navigator Cardiac Imaging [facility] [phone] office [phone] cell  Patient to take '100mg'$  metoprolol tartrate two hours prior to his cardiac CT scan.  He is aware to arrive at 12pm.

## 2021-12-05 ENCOUNTER — Ambulatory Visit (HOSPITAL_COMMUNITY)
Admission: RE | Admit: 2021-12-05 | Discharge: 2021-12-05 | Disposition: A | Discharge status: Home / Self Care | Payer: Self-pay | Source: Ambulatory Visit | Attending: Internal Medicine | Admitting: Internal Medicine

## 2021-12-05 ENCOUNTER — Other Ambulatory Visit: Payer: Self-pay | Admitting: Cardiovascular Disease

## 2021-12-05 DIAGNOSIS — R931 Abnormal findings on diagnostic imaging of heart and coronary circulation: Secondary | ICD-10-CM | POA: Diagnosis not present

## 2021-12-05 DIAGNOSIS — Z006 Encounter for examination for normal comparison and control in clinical research program: Secondary | ICD-10-CM

## 2021-12-05 DIAGNOSIS — I2510 Atherosclerotic heart disease of native coronary artery without angina pectoris: Secondary | ICD-10-CM | POA: Diagnosis not present

## 2021-12-05 MED ORDER — NITROGLYCERIN 0.4 MG SL SUBL
SUBLINGUAL_TABLET | SUBLINGUAL | Status: AC
  Filled 2021-12-05: qty 2

## 2021-12-05 MED ORDER — IOHEXOL 350 MG/ML SOLN
100.0000 mL | Freq: Once | INTRAVENOUS | Status: AC | PRN
  Administered 2021-12-05: 100 mL via INTRAVENOUS

## 2021-12-05 NOTE — Progress Notes (Signed)
EPIC Encounter for ICM Monitoring  Patient Name: Christopher [name] is a 71 y.o. male Date: 12/05/2021 Primary Care Physican: Hoyt Koch, MD Primary Cardiologist: [facility] Electrophysiologist: [facility] 10/30/2021 Weight: 238 lbs (not weighing at home)                            Spoke with patient and heart failure questions reviewed.  Pt asymptomatic for fluid accumulation.  Continues exercising at the [facility].   Optivol thoracic impedance suggesting normal fluid levels.   Spironolactone 25 mg take 0.5 tablet (12.5 total) daily   Labs: 04/18/2021 Creatinine 1.25, BUN 19, Potassium 3.9, Sodium 144, GFR 62 03/28/2021 Creatinine 1.28, BUN 21, Potassium 4.0, Sodium 140, GFR 60  A complete set of results can be found in Results Review.   Recommendations:  No changes and encouraged to call if experiencing any fluid symptoms.   Follow-up plan: ICM clinic phone appointment on 01/05/2022.   91 day device clinic remote transmission 02/16/2022.      EP/Cardiology Office Visits:  12/29/2021 with Dr Debara Pickett.  Recall 12/28/2020 with Dr Rayann Heman.     Copy of ICM check sent to Dr. Rayann Heman.     3 month ICM trend: 12/02/2021.    12-14 Month ICM trend:     Rosalene Billings, RN 12/05/2021 3:33 PM

## 2021-12-05 NOTE — Progress Notes (Signed)
Remote ICD transmission.   

## 2021-12-06 ENCOUNTER — Ambulatory Visit (HOSPITAL_COMMUNITY)
Admission: RE | Admit: 2021-12-06 | Discharge: 2021-12-06 | Disposition: A | Discharge status: Home / Self Care | Payer: Self-pay | Source: Ambulatory Visit | Attending: Cardiovascular Disease | Admitting: Cardiovascular Disease

## 2021-12-06 DIAGNOSIS — R931 Abnormal findings on diagnostic imaging of heart and coronary circulation: Secondary | ICD-10-CM

## 2021-12-08 ENCOUNTER — Encounter: Payer: Self-pay | Admitting: *Deleted

## 2021-12-08 DIAGNOSIS — Z006 Encounter for examination for normal comparison and control in clinical research program: Secondary | ICD-10-CM

## 2021-12-08 NOTE — Research (Signed)
[place] run in

## 2021-12-08 NOTE — Research (Signed)
Spoke with Christopher [name] to remind him of his appointment tomorrow at 1100. Also nothing to eat or drink until after blood is drawn, and given the parking code.  He voices understanding.

## 2021-12-09 ENCOUNTER — Other Ambulatory Visit: Payer: Self-pay

## 2021-12-09 ENCOUNTER — Encounter: Payer: Medicare Other | Admitting: *Deleted

## 2021-12-09 VITALS — BP 120/75 | HR 71 | Temp 97.2°F | Resp 18

## 2021-12-09 DIAGNOSIS — Z006 Encounter for examination for normal comparison and control in clinical research program: Secondary | ICD-10-CM

## 2021-12-09 NOTE — Research (Cosign Needed Addendum)
[facility] Starr Qualification visit -[facility]  09-December-2021        [facility] Abnormal Lab report Month Day, Year    Hematology: RDW 14.9 %                         '[]'$ Clinically Significant  '[x]'$ Not Clinically Significant     Lipids:  Triglyceride 162 mg/uL                   '[]'$ Clinically Significant  '[x]'$ Not Clinically Significant   Any further action needed to be taken per the PI?  No  Screening Qualification Visit [facility]   Subject Number: [id number]                        MBWG:[id number]    '[x]'$ Inclusion/Exclusion Criteria   '[x]'$ Collection of labs per protocol   '[x]'$ Assessment of ER Visits, Hospitalization and Inpatient Days  '[x]'$ Adverse Events and Concomitant Medications  Mr Wold here for Qualification visit for [facility]ence He reports no chest pain, no abd pain, or any other pain. He states that he feels good. No changes in his medications, no visits to the Ed, or urgent care. He has an appointment to see Dr Debara Pickett the end of this month. Dr Lia Foyer in to speak with pt, to go over CTa results.  VS taken at 1111 Bp-120/75  HR 71 Resp 18 O2sat 97% Temp 97.2 Scheduled next visit for June 20 at 1100.  Pixie Casino, MD, [facility], [place] Director of the Advanced Lipid Disorders &  Cardiovascular Risk Reduction Clinic Diplomate of the American Board of Clinical Lipidology Attending Cardiologist  Direct Dial: [phone]  Fax: [phone]  Website:  [email or web address]

## 2021-12-12 ENCOUNTER — Other Ambulatory Visit: Payer: Self-pay | Admitting: *Deleted

## 2021-12-12 ENCOUNTER — Encounter: Payer: Self-pay | Admitting: Gastroenterology

## 2021-12-12 ENCOUNTER — Encounter: Payer: Self-pay | Admitting: Internal Medicine

## 2021-12-12 DIAGNOSIS — E785 Hyperlipidemia, unspecified: Secondary | ICD-10-CM

## 2021-12-12 NOTE — Research (Signed)
Juleen Starr Essence Qualification 09-Dec-2021

## 2021-12-19 NOTE — Research (Addendum)
Juleen Starr Essence Qualification 09-December-2021    Apolipoprotein B48 2.82 mg/dL         '[]'$ Clinically Significant  '[]'$ Not Clinically Significant          '[]'$ Clinically Significant  '[x]'$ Not Clinically Significant  Pixie Casino, MD, [facility], [place] Director of the Advanced Lipid Disorders &  Cardiovascular Risk Reduction Clinic Diplomate of the American Board of Clinical Lipidology Attending Cardiologist  Direct Dial: [phone]  Fax: [phone]  Website:  [email or web address]

## 2021-12-22 ENCOUNTER — Encounter: Payer: Self-pay | Admitting: *Deleted

## 2021-12-22 ENCOUNTER — Ambulatory Visit (HOSPITAL_COMMUNITY): Payer: Medicare Other | Attending: Internal Medicine

## 2021-12-22 DIAGNOSIS — I428 Other cardiomyopathies: Secondary | ICD-10-CM | POA: Insufficient documentation

## 2021-12-22 DIAGNOSIS — Z006 Encounter for examination for normal comparison and control in clinical research program: Secondary | ICD-10-CM

## 2021-12-22 NOTE — Research (Signed)
Spoke with Christopher [name] to remind him of his appointment tomorrow at 1100. Voices understanding.

## 2021-12-23 ENCOUNTER — Other Ambulatory Visit: Payer: Self-pay

## 2021-12-23 ENCOUNTER — Encounter: Payer: Medicare Other | Admitting: *Deleted

## 2021-12-23 VITALS — BP 146/80 | HR 63 | Temp 97.3°F | Resp 18 | Wt 235.0 lb

## 2021-12-23 DIAGNOSIS — E785 Hyperlipidemia, unspecified: Secondary | ICD-10-CM | POA: Diagnosis not present

## 2021-12-23 DIAGNOSIS — Z006 Encounter for examination for normal comparison and control in clinical research program: Secondary | ICD-10-CM

## 2021-12-23 LAB — ECHOCARDIOGRAM COMPLETE
Area-P 1/2: 2.2 cm2
MV VTI: 2.05 cm2
P 1/2 time: 740 msec
S' Lateral: 4.6 cm

## 2021-12-23 MED ORDER — STUDY - ESSENCE - OLEZARSEN 50 MG, 80 MG OR PLACEBO SQ INJECTION (PI-HILTY)
80.0000 mg | INJECTION | Freq: Once | SUBCUTANEOUS | Status: AC
  Administered 2021-12-23: 80 mg via SUBCUTANEOUS
  Filled 2021-12-23: qty 0.8

## 2021-12-23 NOTE — Progress Notes (Signed)
Patient seen today for randomization and first injection.  Stable.  Reviewed his echo results as he is anxious about results.  I also spoke with Dr.Hilty regarding patient's triglycerides who favored inclusion in the trial.  Reviewed with patient in detail.   No JVD.  Lungs clear to A and P Cardiac regular without murmur. No carotid bruits.  No extremity edema. No focal neuro findings.   ECG prior IMI. IVCD. LVH. LAD.   Plan is to proceed today with randomization and injection of study agent.   Long discussion with patient regarding his case.   Loretha Brasil. Lia Foyer, MD, [facility], [place] Director, [facility]

## 2021-12-23 NOTE — Research (Addendum)
Essence Week 1Day 1 ALOYS HUPFER 23-December-2021       Essence Chemistry: Uric Acid 3.8 mg/dL                             _0 Clinically Significant  _1 Not Clinically Significant  Hs-C- Reactive Protein  3.3 mg/L        _2 Clinically Significant  _3 Not Clinically Significant   Hematology:  RDW 15.2 %                                      _4 Clinically Significant  _5 Not Clinically Significant   Urinalysis: Glucose   >1000    mg/dL                     _6 Clinically Significant  _7 Not Clinically Significant  Urine Chemistry: Albumin Creatinine Ratio  37 mg/g          _8 Clinically Significant  _9 Not Clinically Significant   Lipids:  Non-HDL Cholesterol (calc) 75 mg/dL          _10 Clinically Significant  _11 Not Clinically Significant   Any further action needed to be taken per the PI?   Essence   TREATMENT DAY 1 - STUDY WEEK 1    Subject Number: [id number]            Randomization [id number]:[id number]           Date:23-December-2021      _12 Vital Signs Collected - Blood Pressure: 146/80 - Weight: 235.0 - Heart Rate:63 - Respiratory Rate:18 - Temperature:97.3 - Oxygen Saturation:98%  _13  Physical Exam Completed by PI or SUB-I  _14  12-lead ECG  _15  Extended Urinalysis   _16  Lab collection per protocol  _17  Assessment of ER Visits, Hospitalizations, and Inpatient Days  _18  Adverse Events and Concomitant Medications  _19  Diet, Lifestyle, and Alcohol Counseling   _20  Study Drug: [place] Injection    Mr RUBE SANCHEZ here for Essence day 1 week 1 visit. He reports no abd pain, or any other pain, no visits to the ED or Urgent care. No changes with his meds. He reports he had an echo and is waiting for the results. VS taken at 1110, Blood drawn at 1127, and urine obtained at 1132. Dr Lia Foyer did exam and reviewed EKG. Injection was given in left lower quad at 1158. Kit number [id number].

## 2021-12-24 LAB — LIPID PANEL
Chol/HDL Ratio: 3.2 ratio (ref 0.0–5.0)
Cholesterol, Total: 120 mg/dL (ref 100–199)
HDL: 38 mg/dL — ABNORMAL LOW (ref >39)
LDL Chol Calc (NIH): 60 mg/dL (ref 0–99)
Triglycerides: 122 mg/dL (ref 0–149)
VLDL Cholesterol Cal: 22 mg/dL (ref 5–40)

## 2021-12-24 LAB — COMPREHENSIVE METABOLIC PANEL
ALT: 13 IU/L (ref 0–44)
AST: 21 IU/L (ref 0–40)
Albumin/Globulin Ratio: 1.9 (ref 1.2–2.2)
Albumin: 4.5 g/dL (ref 3.7–4.7)
Alkaline Phosphatase: 52 IU/L (ref 44–121)
BUN/Creatinine Ratio: 14 (ref 10–24)
BUN: 17 mg/dL (ref 8–27)
Bilirubin Total: 0.5 mg/dL (ref 0.0–1.2)
CO2: 19 mmol/L — ABNORMAL LOW (ref 20–29)
Calcium: 9.1 mg/dL (ref 8.6–10.2)
Chloride: 104 mmol/L (ref 96–106)
Creatinine, Ser: 1.19 mg/dL (ref 0.76–1.27)
Globulin, Total: 2.4 g/dL (ref 1.5–4.5)
Glucose: 69 mg/dL — ABNORMAL LOW (ref 70–99)
Potassium: 4 mmol/L (ref 3.5–5.2)
Sodium: 142 mmol/L (ref 134–144)
Total Protein: 6.9 g/dL (ref 6.0–8.5)
eGFR: 65 mL/min/{1.73_m2} (ref >59)

## 2021-12-25 ENCOUNTER — Encounter: Payer: Self-pay | Admitting: Gastroenterology

## 2021-12-25 ENCOUNTER — Telehealth: Payer: Self-pay | Admitting: *Deleted

## 2021-12-25 NOTE — Telephone Encounter (Signed)
Plavix clearance letter has been sent to Dr. Sharlet Salina. Staff reminder sent to self to ensure clearance is received prior to procedure date.

## 2021-12-25 NOTE — Telephone Encounter (Signed)
Need clearance for PLAVIX for this patient . Thank you!

## 2021-12-27 ENCOUNTER — Other Ambulatory Visit: Payer: Self-pay | Admitting: Internal Medicine

## 2021-12-29 ENCOUNTER — Ambulatory Visit: Payer: Medicare Other | Admitting: Internal Medicine

## 2021-12-29 ENCOUNTER — Encounter: Payer: Self-pay | Admitting: Internal Medicine

## 2021-12-29 VITALS — BP 128/74 | HR 56 | Ht 70.0 in | Wt 233.8 lb

## 2021-12-29 DIAGNOSIS — I2510 Atherosclerotic heart disease of native coronary artery without angina pectoris: Secondary | ICD-10-CM

## 2021-12-29 DIAGNOSIS — N528 Other male erectile dysfunction: Secondary | ICD-10-CM | POA: Diagnosis not present

## 2021-12-29 DIAGNOSIS — Z95810 Presence of automatic (implantable) cardiac defibrillator: Secondary | ICD-10-CM | POA: Diagnosis not present

## 2021-12-29 DIAGNOSIS — I255 Ischemic cardiomyopathy: Secondary | ICD-10-CM | POA: Diagnosis not present

## 2021-12-29 MED ORDER — SILDENAFIL CITRATE 20 MG PO TABS: 20.0000 mg | ORAL_TABLET | Freq: Every day | ORAL | 0 refills | Status: DC | PRN

## 2021-12-31 ENCOUNTER — Telehealth: Payer: Self-pay

## 2021-12-31 NOTE — Telephone Encounter (Signed)
Clearance letter has been sent again to PCP & faxed.

## 2022-01-01 NOTE — Research (Addendum)
Dr Debara Pickett to review labs  [place] Week 1 Day 1 23-December-2021   Apolipoprotein B48  0.84 mg/dL        [] Clinically Significant  [x] Not Clinically Significant     Any further action needed to be taken per the PI?  No  Pixie Casino, MD, [facility], [place] Director of the Advanced Lipid Disorders &  Cardiovascular Risk Reduction Clinic Diplomate of the American Board of Clinical Lipidology Attending Cardiologist  Direct Dial: [phone]  Fax: [phone]  Website:  [email or web address]    Labs not clinically significant.  Pixie Casino, MD, [facility], [place] Director of the Advanced Lipid Disorders &  Cardiovascular Risk Reduction Clinic Diplomate of the American Board of Clinical Lipidology Attending Cardiologist  Direct Dial: [phone]  Fax: [phone]  Website:  [email or web address]

## 2022-01-02 ENCOUNTER — Other Ambulatory Visit: Payer: Self-pay | Admitting: Internal Medicine

## 2022-01-05 ENCOUNTER — Ambulatory Visit (INDEPENDENT_AMBULATORY_CARE_PROVIDER_SITE_OTHER): Payer: Medicare Other

## 2022-01-05 DIAGNOSIS — Z95810 Presence of automatic (implantable) cardiac defibrillator: Secondary | ICD-10-CM | POA: Diagnosis not present

## 2022-01-05 DIAGNOSIS — I5022 Chronic systolic (congestive) heart failure: Secondary | ICD-10-CM | POA: Diagnosis not present

## 2022-01-06 LAB — CUP PACEART REMOTE DEVICE CHECK
Battery Remaining Longevity: 77 mo
Battery Voltage: 3 V
Brady Statistic RV Percent Paced: 0.79 %
Date Time Interrogation Session: 20230703073729
HighPow Impedance: 69 Ohm
Implantable Lead Implant Date: 20181102
Implantable Lead Location: 753860
Implantable Pulse Generator Implant Date: 20181102
Lead Channel Impedance Value: 437 Ohm
Lead Channel Impedance Value: 570 Ohm
Lead Channel Pacing Threshold Amplitude: 0.5 V
Lead Channel Pacing Threshold Pulse Width: 0.4 ms
Lead Channel Sensing Intrinsic Amplitude: 5.25 mV
Lead Channel Sensing Intrinsic Amplitude: 5.25 mV
Lead Channel Setting Pacing Amplitude: 2 V
Lead Channel Setting Pacing Pulse Width: 0.4 ms
Lead Channel Setting Sensing Sensitivity: 0.3 mV

## 2022-01-07 ENCOUNTER — Other Ambulatory Visit: Payer: Self-pay | Admitting: Physician Assistant

## 2022-01-07 NOTE — Progress Notes (Addendum)
EPIC Encounter for ICM Monitoring  Patient Name: Christopher [name] is a 71 y.o. male Date: 01/07/2022 Primary Care Physican: Hoyt Koch, MD Primary Cardiologist: [facility] Electrophysiologist: [place] 10/30/2021 Weight: 238 lbs (not weighing at home)    01/07/2022 Weight: 234 lbs                         Spoke with patient and heart failure questions reviewed.  Pt asymptomatic for fluid accumulation.  He denies changes in diet or fluid intake during decreased impedance.  He has stocked up on freezer dinners and will compare if there is a change in impedance next month.     Optivol thoracic impedance suggesting normal fluid levels.  6/1-6/24.   Spironolactone 25 mg take 0.5 tablet (12.5 total) daily   Labs: 04/18/2021 Creatinine 1.25, BUN 19, Potassium 3.9, Sodium 144, GFR 62 03/28/2021 Creatinine 1.28, BUN 21, Potassium 4.0, Sodium 140, GFR 60  A complete set of results can be found in Results Review.   Recommendations:  No changes and encouraged to call if experiencing any fluid symptoms.   Follow-up plan: ICM clinic phone appointment on 02/09/2022.   91 day device clinic remote transmission 02/16/2022.      EP/Cardiology Office Visits:  06/25/2022 with Dr Debara Pickett.  Recall 12/28/2020 with Dr Rayann Heman.     Copy of ICM check sent to Dr. Rayann Heman.    3 month ICM trend: 01/05/2022.    12-14 Month ICM trend:     Rosalene Billings, RN 01/07/2022 12:20 PM

## 2022-01-08 ENCOUNTER — Telehealth: Payer: Self-pay | Admitting: *Deleted

## 2022-01-08 ENCOUNTER — Ambulatory Visit (AMBULATORY_SURGERY_CENTER): Payer: Self-pay | Admitting: *Deleted

## 2022-01-08 VITALS — Ht 70.0 in | Wt 231.0 lb

## 2022-01-08 DIAGNOSIS — Z86010 Personal history of colonic polyps: Secondary | ICD-10-CM

## 2022-01-08 DIAGNOSIS — M25551 Pain in right hip: Secondary | ICD-10-CM | POA: Insufficient documentation

## 2022-01-08 MED ORDER — NA SULFATE-K SULFATE-MG SULF 17.5-3.13-1.6 GM/177ML PO SOLN: 1.0000 | Freq: Once | ORAL | 0 refills | Status: AC

## 2022-01-08 NOTE — Telephone Encounter (Signed)
Dr Fuller Plan,  This pt had a recall for June 2023- he had a PV today- Last OV with you 2020, he was given a Plavix hold by PCP , Sharlet Salina but has had some medical changes since his last OV with you in 2020.   Marland Kitchen  Can you please review his chart and advise if it is ok to proceed as scheduled for 8-2 colon or if he needs an OV prior .   Thanks for your time,Marie PV

## 2022-01-08 NOTE — Progress Notes (Signed)
No egg or soy allergy known to patient  No issues known to pt with past sedation with any surgeries or procedures Patient denies ever being told they had issues or difficulty with intubation  No FH of Malignant Hyperthermia Pt is not on diet pills Pt is not on  home 02  Pt is on blood thinners  on Plavix- hold in Epic as of 01-08-22 x 5 days  Pt denies issues with constipation  Hs of A fib  Have any cardiac testing pending--pt denies any testing - ECHO 12-2021- + pacemaker/ ICD  Pt instructed to use Singlecare.com or GoodRx for a price reduction on prep   Pt has not had an OV since 2020 , he is on Plavix  and has had complicated med hx changes since last OV 2020- TE Dr Fuller Plan-  ? OV needed

## 2022-01-09 DIAGNOSIS — M25551 Pain in right hip: Secondary | ICD-10-CM | POA: Diagnosis not present

## 2022-01-09 DIAGNOSIS — M533 Sacrococcygeal disorders, not elsewhere classified: Secondary | ICD-10-CM | POA: Diagnosis not present

## 2022-01-10 NOTE — Telephone Encounter (Signed)
Please schedule an office visit to further assess and discuss risks, benefits to colonoscopy.

## 2022-01-12 NOTE — Telephone Encounter (Signed)
OV 7-26 930 am with Darrell Jewel- sch with pt-

## 2022-01-18 ENCOUNTER — Other Ambulatory Visit: Payer: Self-pay | Admitting: Internal Medicine

## 2022-01-19 ENCOUNTER — Encounter: Payer: Self-pay | Admitting: *Deleted

## 2022-01-19 DIAGNOSIS — Z006 Encounter for examination for normal comparison and control in clinical research program: Secondary | ICD-10-CM

## 2022-01-19 NOTE — Research (Signed)
Message left for Christopher [name] to remind him of his appointment tomorrow at 1100.

## 2022-01-20 ENCOUNTER — Other Ambulatory Visit: Payer: Self-pay

## 2022-01-20 ENCOUNTER — Encounter: Payer: Medicare Other | Admitting: *Deleted

## 2022-01-20 VITALS — BP 142/71 | HR 62 | Temp 97.4°F | Resp 18 | Wt 231.8 lb

## 2022-01-20 DIAGNOSIS — Z006 Encounter for examination for normal comparison and control in clinical research program: Secondary | ICD-10-CM

## 2022-01-20 MED ORDER — STUDY - ESSENCE - OLEZARSEN 50 MG, 80 MG OR PLACEBO SQ INJECTION (PI-HILTY)
80.0000 mg | INJECTION | Freq: Once | SUBCUTANEOUS | Status: AC
  Administered 2022-01-20: 80 mg via SUBCUTANEOUS
  Filled 2022-01-20: qty 0.8

## 2022-01-20 NOTE — Research (Signed)
     TREATMENT DAY 29 - STUDY WEEK 5    Subject Number: [id number]             Randomization ESLPNP:[id number]          Date:20-January-2022      _0 Vital Signs Collected taken at 1130 - Blood Pressure:142/71 - Weight:231.8 - Heart Rate:62 - Respiratory Rate:18 - Temperature:97.4 - Oxygen Saturation:99%  _1  Extended Urinalysis - obtained at 1146  _2  Lab collection per protocol- blood work at 1140  _3  Symptoms (abdominal pain only) since last visit  _4  Assessment of ER Visits, Hospitalizations, and Inpatient Days  _5  Adverse Events and Concomitant Medications  _6  Diet, Lifestyle, and Alcohol Counseling   _7  Study Drug: [place] Injection - injection given at 1208 in left lower abd  Mr Mentzer here for week 5 day 29 essence visit. He reports no pain, no visits to the Ed or Urgent care. No changes in his medications. Injection was given in left lower abd kit number [id number]. Next visit scheduled for 02-17-22 at 1100.

## 2022-01-21 ENCOUNTER — Other Ambulatory Visit: Payer: Self-pay | Admitting: Internal Medicine

## 2022-01-22 NOTE — Research (Addendum)
Christopher [name] Christopher [name] Week 5 day 29 20-January-2022       Abnormal labs- Christopher [name] 20-January-2022  Chemistry: Uric Acid 3.9 mg/dL               '[]'$ Clinically Significant  '[x]'$ Not Clinically Significant    Hematology: RDW 15.5%                            '[]'$ Clinically Significant  '[x]'$ Not Clinically Significant    Urinalysis: Glucose >1000 mg/dL                      '[]'$ Clinically Significant  '[x]'$ Not Clinically Significant Protein 10 mg/dL                              '[]'$ Clinically Significant  '[x]'$ Not Clinically Significant Uric Acid Crystals  Present               '[]'$ Clinically Significant  '[x]'$ Not Clinically Significant Calcium Oxalate Crystals  present   '[]'$ Clinically Significant  '[x]'$ Not Clinically Significant Mucous 1+                                        '[]'$ Clinically Significant  '[x]'$ Not Clinically Significant  Urine Chemistry: Albumin Creatinine Ratio 22  mg/g     '[]'$ Clinically Significant  '[x]'$ Not Clinically Significant    Any further action needed to be taken per the PI?  No  Pixie Casino, MD, [facility], [place] Director of the Advanced Lipid Disorders &  Cardiovascular Risk Reduction Clinic Diplomate of the American Board of Clinical Lipidology Attending Cardiologist  Direct Dial: [phone]  Fax: [phone]  Website:  [email or web address]

## 2022-01-28 ENCOUNTER — Encounter: Payer: Self-pay | Admitting: Physician Assistant

## 2022-01-28 ENCOUNTER — Ambulatory Visit: Payer: Medicare Other | Admitting: Physician Assistant

## 2022-01-28 VITALS — BP 122/76 | HR 80 | Ht 62.0 in | Wt 231.0 lb

## 2022-01-28 DIAGNOSIS — I2510 Atherosclerotic heart disease of native coronary artery without angina pectoris: Secondary | ICD-10-CM

## 2022-01-28 DIAGNOSIS — I480 Paroxysmal atrial fibrillation: Secondary | ICD-10-CM | POA: Diagnosis not present

## 2022-01-28 DIAGNOSIS — I428 Other cardiomyopathies: Secondary | ICD-10-CM

## 2022-01-28 DIAGNOSIS — Z7901 Long term (current) use of anticoagulants: Secondary | ICD-10-CM

## 2022-01-28 DIAGNOSIS — Z86010 Personal history of colonic polyps: Secondary | ICD-10-CM

## 2022-01-28 NOTE — Progress Notes (Signed)
Chief Complaint: Discuss colonoscopy on chronic anticoagulation  HPI:    Christopher [name] is a 71 year old male with a past medical history of A-fib, CHF, CAD, GERD, status post mitral valve repair on Plavix (12/22/2021 echo with LVEF 45-50% and stable mitral valve gradient), known to Dr. Fuller Plan, who presents to clinic today to discuss a colonoscopy given his history of polyps.    11/10/2018 colonoscopy with two 7-8 mm polyps in the sigmoid colon and cecum, two 4-5 mm polyps in the descending colon, mild diverticulosis in the left colon, melanosis in the colon, internal hemorrhoids and otherwise normal.  Pathology showed a mixture of tubular adenomas, sessile serrated polyps and hyperplastic.  Repeat was recommended in 3 years.      Patient tentatively scheduled 02/04/2022 for colonoscopy with Dr. Fuller Plan.    11/18/2021 CBC normal, CMP normal.    12/29/2021 patient followed with cardiology and was asymptomatic.  From their viewpoint was doing well.    Today, the patient tells me that he is doing well.  Explains that he just followed with cardiology who also think he is doing well.  He has no chest pain or shortness of breath.  He is aware of his upcoming colonoscopy and need to hold his Plavix.  He denies any GI complaints or concerns.    Denies fever, chills, weight loss, change in bowel habits, heartburn or reflux.     Past Medical History:  Diagnosis Date   Allergy    Arthritis    Atrial fibrillation ([place])    post op, intol of anticoag   Cardiac arrest [facility])    a. s/p MDT single chamber ICD; 11-10-18- pt denies having a heart attack   Cataract    CHF (congestive heart failure) ([place])    Chronic kidney disease    kidney stone   Coronary artery disease    a. 2/7 Cath: LM nl, LAD min irregs, LCX min irregs, RI 40, RCA 10m EF 55-60% basal to mid inf HK, 3-4+ MR;  b. 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 Mitral valve prolapse with flail segment of posterior leaflet and severe MR by TEE, remote h/o bacterial endocarditis    Sleep apnea    NPSG 01/21/06- AHI 40.7/hr cpap   Stroke ([facility]    summer 2022- one messed up vision, the other balance   Subacute bacterial endocarditis 03/22/2008   Strep viridans   Ventricular fibrillation ([place] 11/07/2019   appropriate shock (36J) for VF delivered    Past Surgical History:  Procedure Laterality Date   AMPUTATION  07/28/2012   Procedure: AMPUTATION DIGIT;  Surgeon: PColin Rhein MD;  Location: [facility];  Service: Orthopedics;  Laterality: Right;  2nd toe   CARDIAC CATHETERIZATION     COLONOSCOPY     EP IMPLANTABLE DEVICE N/A 05/01/2016   Procedure: Loop Recorder Removal;  Surgeon: JThompson Grayer MD;  [place];  Service: Cardiovascular;  Laterality: N/A;   FOOT SURGERY  BILATERAL TOES   ICD IMPLANT N/A 05/07/2017    Medtronic Visia AF MRI VR SureScan implanted by Dr Curt Bears following VF arrest   Implantable loop recorder placement  03/31/2013   MDT Linq implanted by Dr Rayann Heman to evaluate for further afib   INTRAOPERATIVE TRANSESOPHAGEAL ECHOCARDIOGRAM N/A 09/27/2012   Procedure: INTRAOPERATIVE TRANSESOPHAGEAL ECHOCARDIOGRAM;  Surgeon: [name], MD;  Location: [place];  Service: Open Heart Surgery;  Laterality: N/A;   LEFT AND RIGHT HEART CATHETERIZATION WITH CORONARY ANGIOGRAM N/A 08/12/2012   Procedure: LEFT AND RIGHT HEART CATHETERIZATION WITH CORONARY ANGIOGRAM;  Surgeon: [name], MD;  Location: [facility] CATH LAB;  Service: Cardiovascular;   Laterality: N/A;   LEFT HEART CATH AND CORONARY ANGIOGRAPHY N/A 05/06/2017   Procedure: LEFT HEART CATH AND CORONARY ANGIOGRAPHY;  Surgeon: [name], MD;  Location: [place] CV LAB;  Service: Cardiovascular;  Laterality: N/A;   LOOP RECORDER IMPLANT N/A 03/31/2013   Procedure: LOOP RECORDER IMPLANT;  Surgeon: [name], MD;  Location: [place] CATH LAB;  Service: Cardiovascular;  Laterality: N/A;   MITRAL VALVE REPAIR Right 09/27/2012   Procedure: MINIMALLY INVASIVE MITRAL VALVE REPAIR (MVR);  Surgeon: [name], MD;  Location: [place];  Service: Open Heart Surgery;  Laterality: Right;  Ultrasound guided   PERCUTANEOUS CORONARY STENT INTERVENTION (PCI-S) N/A 08/25/2012   Procedure: PERCUTANEOUS CORONARY STENT INTERVENTION (PCI-S);  Surgeon: Sherren Mocha, MD;  Location[facility] CATH LAB;  Service: Cardiovascular;  Laterality: N/A;   POLYPECTOMY     SEPTOPLASTY     Dr. [name]   TEE WITHOUT CARDIOVERSION N/A 08/12/2012   Procedure: TRANSESOPHAGEAL ECHOCARDIOGRAM (TEE);  Surgeon: [name], MD;  Location: [facility] ENDOSCOPY;  Service: Cardiovascular;  Laterality: N/A;   TONSILLECTOMY     UVULOPALATOPHARYNGOPLASTY      Current Outpatient Medications  Medication Sig Dispense Refill   acetaminophen (TYLENOL) 500 MG tablet Take 500 mg by mouth every 6 (six) hours as needed for mild pain or headache.      atorvastatin (LIPITOR) 40 MG tablet TAKE 1 TABLET BY MOUTH  DAILY 100 tablet 2   blood glucose meter kit and supplies KIT Dispense based on patient and insurance preference. Use up to four times daily as directed. 1 each 0   cetirizine (ZYRTEC) 10 MG tablet Take 10 mg by mouth daily.     cholecalciferol (VITAMIN D) 25 MCG (1000 UNIT) tablet Take 1 tablet by mouth daily.      clopidogrel (PLAVIX) 75 MG tablet TAKE 1 TABLET BY MOUTH  DAILY 100 tablet 2   FARXIGA 10 MG TABS tablet TAKE 1 TABLET BY MOUTH DAILY BEFORE BREAKFAST. 90 tablet 3   fenofibrate (TRICOR) 145 MG tablet TAKE 1  TABLET BY MOUTH  DAILY 90 tablet 3   fluticasone (FLONASE) 50 MCG/ACT nasal spray USE 2 SPRAYS IN BOTH NOSTRILS  DAILY 64 g 0   Insulin Lispro Prot & Lispro (HUMALOG MIX 75/25 KWIKPEN) (75-25) 100 UNIT/ML Kwikpen Inject 20 Units into the skin in the morning and at bedtime. 45 mL 2   metFORMIN (GLUCOPHAGE) 1000 MG tablet TAKE 1 TABLET BY MOUTH  TWICE DAILY WITH A MEAL 180 tablet 3   metoprolol succinate (TOPROL-XL) 100 MG 24 hr tablet TAKE 1 TABLET BY MOUTH  DAILY IMMEDIATELY FOLLOWING A MEAL 90 tablet 3   metoprolol tartrate (LOPRESSOR) 100 MG tablet Take 1 tablet (100 mg total) by mouth once for 1 dose. Please take one time dose 180m metoprolol tartrate 2 hr prior  to cardiac CT for HR control IF HR >55bpm. 1 tablet 0   OVER THE COUNTER MEDICATION 1 tablet in the morning and at bedtime. [facility] - Restore multivitamin     sacubitril-valsartan (ENTRESTO) 97-103 MG Take 1 tablet by mouth 2 (two) times daily. 180 tablet 3   sildenafil (REVATIO) 20 MG tablet Take 1 tablet (20 mg total) by mouth daily as needed. DO NOT USE with nitrates (Patient not taking: Reported on 01/08/2022) 10 tablet 0   spironolactone (ALDACTONE) 25 MG tablet Take 0.5 tablets (12.5 mg total) by mouth daily. 45 tablet 1   Study - ESSENCE - olezarsen 50 mg, 80 mg or placebo SQ injection (PI-Hilty) Inject into the skin once.     Study - ESSENCE - olezarsen 50 mg, 80 mg or placebo SQ injection (PI-Hilty) Inject 80 mg into the skin every 30 (thirty) days. For Investigational Use Only. Injection subcutaneously in protocol approved injection sites (abdomen, thigh or outer area of upper arm) every 4 weeks. Please contact [facility] for any questions or concerns regarding this medication.     tamsulosin (FLOMAX) 0.4 MG CAPS capsule TAKE 1 CAPSULE BY MOUTH  DAILY 90 capsule 3   No current facility-administered medications for this visit.    Allergies as of 01/28/2022 - Review Complete 01/08/2022   Allergen Reaction Noted   Codeine Other (See Comments) 09/27/2012   Dust mite extract Cough 10/21/2012   Pollen extract Cough 10/21/2012    Family History  Problem Relation Age of Onset   Diabetes Mother    Stroke Mother    Heart disease Father    Colon cancer Father 35   Diabetes Father    Renal cancer Father    Sudden death Brother    Heart attack Brother    Esophageal cancer Neg Hx    Rectal cancer Neg Hx    Stomach cancer Neg Hx    Colon polyps Neg Hx     Social History   Socioeconomic History   Marital status: Divorced    Spouse name: Not on file   Number of children: Not on file   Years of education: Not on file   Highest education level: Not on file  Occupational History   Occupation: Works at [employer] Retired  Tobacco Use   Smoking status: Former    Packs/day: 2.50    Years: 5.00    Total pack years: 12.50    Types: Cigarettes    Quit date: 07/06/1978    Years since quitting: 43.5   Smokeless tobacco: Never   Tobacco comments:    social drinker  Vaping Use   Vaping Use: Never used  Substance and Sexual Activity   Alcohol use: Yes    Comment: OCC.   Drug use: No   Sexual activity: Yes  Other Topics Concern   Not on file  Social History Narrative   Works at [employer] - plans to retire 07/2015 after 36 years.   Divorced, lives alone   Supportive g-friend (shirlee moore)         Social Determinants of Health   Financial Resource Strain: Low Risk  (09/05/2020)   Overall Financial Resource Strain (CARDIA)    Difficulty of Paying Living Expenses: Not hard at all  Food Insecurity: No Food Insecurity (09/05/2020)   Hunger Vital Sign    Worried About Running Out of Food in the Last Year: Never true    Ran Out of Food in the Last Year: Never true  Transportation Needs: No Transportation Needs (09/05/2020)   PRAPARE - Hydrologist (Medical): No    Lack of Transportation (Non-Medical): No  Physical Activity: Sufficiently Active  (09/05/2020)   Exercise Vital Sign    Days of Exercise per Week: 5 days    Minutes of Exercise per Session: 30 min  Stress: No Stress Concern Present (09/05/2020)   [place]    Feeling of Stress : Not at all  Social Connections: Moderately Integrated (09/05/2020)   Social Connection and Isolation Panel [NHANES]    Frequency of Communication with Friends and Family: More than three times a week    Frequency of Social Gatherings with Friends and Family: More than three times a week    Attends Religious Services: More than 4 times per year    Active Member of [employer] or Organizations: No    Attends Music therapist: More than 4 times per year    Marital Status: Divorced  Human resources officer Violence: Not At Risk (09/05/2020)   Humiliation, Afraid, Rape, and Kick questionnaire    Fear of Current or Ex-Partner: No    Emotionally Abused: No    Physically Abused: No    Sexually Abused: No    Review of Systems:    Constitutional: No weight loss, fever or chills Skin: No rash  Cardiovascular: No chest pain Respiratory: No SOB  Gastrointestinal: See HPI and otherwise negative Genitourinary: No dysuria  Neurological: No headache, dizziness or syncope Musculoskeletal: No new muscle or joint pain Hematologic: No bleeding  Psychiatric: No history of depression or anxiety   Physical Exam:  Vital signs: BP 122/76   Pulse 80   Ht _0  (1.575 m)   Wt 231 lb (104.8 kg)   SpO2 97%   BMI 42.25 kg/m    Constitutional:   Pleasant Caucasian male appears to be in NAD, Well developed, Well nourished, alert and cooperative Respiratory: Respirations even and unlabored. Lungs clear to auscultation bilaterally.   No wheezes, crackles, or rhonchi.  Cardiovascular: Normal S1, S2. No MRG. Regular rate and rhythm. No peripheral edema, cyanosis or pallor.  Gastrointestinal:  Soft, nondistended, nontender. No rebound or guarding. Normal  bowel sounds. No appreciable masses or hepatomegaly. Rectal:  Not performed.  Psychiatric: Demonstrates good judgement and reason without abnormal affect or behaviors.  RELEVANT LABS AND IMAGING: CBC    Component Value Date/Time   WBC 6.2 11/18/2021 1324   RBC 5.39 11/18/2021 1324   HGB 15.8 11/18/2021 1324   HCT 47.5 11/18/2021 1324   PLT 187.0 11/18/2021 1324   MCV 88.2 11/18/2021 1324   MCH 29.9 02/16/2021 0033   MCHC 33.2 11/18/2021 1324   RDW 15.3 11/18/2021 1324   LYMPHSABS 1.5 05/09/2017 0328   MONOABS 0.6 05/09/2017 0328   EOSABS 0.2 05/09/2017 0328   BASOSABS 0.0 05/09/2017 0328    CMP     Component Value Date/Time   NA 142 12/23/2021 1501   K 4.0 12/23/2021 1501   CL 104 12/23/2021 1501   CO2 19 (L) 12/23/2021 1501   GLUCOSE 69 (L) 12/23/2021 1501   GLUCOSE 95 11/18/2021 1324   BUN 17 12/23/2021 1501   CREATININE 1.19 12/23/2021 1501   CREATININE 1.07 05/03/2013 0959   CALCIUM 9.1 12/23/2021 1501   PROT 6.9 12/23/2021 1501   ALBUMIN 4.5 12/23/2021 1501   AST 21 12/23/2021 1501   ALT 13 12/23/2021 1501   ALKPHOS 52 12/23/2021 1501  BILITOT 0.5 12/23/2021 1501   GFRNONAA >60 02/18/2021 0100   GFRAA >60 03/31/2020 1455    Assessment: 1.  History of adenomatous polyps: Last colonoscopy 11/10/2018, repeat recommended in 3 years, patient is tentatively scheduled 02/04/2022 2.  CAD status post MI: On Plavix  Plan: 1.  Advised the patient to hold his Plavix for 5 days prior to time of procedure.  It looks like we have already received clearance from his cardiologist in regards to this recommendation. 2.  Patient is already scheduled for colonoscopy 02/04/2022.  Patient was provided with information regarding risks of the procedure and agrees to proceed. Patient is appropriate for endoscopic procedure(s) in the ambulatory ([place]) setting.  We reviewed prep instructions with the patient while here. 3.  Patient to follow in clinic per recommendations after time of  procedure.  Ellouise Newer, PA-C [facility] 01/28/2022, 9:25 AM  Cc: Hoyt Koch, *

## 2022-01-28 NOTE — Patient Instructions (Signed)
Please keep your scheduled colonoscopy on 02/04/22.   Hold Plavix 5 days prior to your procedure as okayed by your cardiologist.  If you are age 72 or older, your body mass index should be between 23-30. Your Body mass index is 42.25 kg/m. If this is out of the aforementioned range listed, please consider follow up with your Primary Care Provider.  If you are age 56 or younger, your body mass index should be between 19-25. Your Body mass index is 42.25 kg/m. If this is out of the aformentioned range listed, please consider follow up with your Primary Care Provider.   ________________________________________________________  The [place] providers would like to encourage you to use [facility] to communicate with providers for non-urgent requests or questions.  Due to long hold times on the telephone, sending your provider a message by [facility] may be a faster and more efficient way to get a response.  Please allow 48 business hours for a response.  Please remember that this is for non-urgent requests.  _______________________________________________________  Due to recent changes in healthcare laws, you may see the results of your imaging and laboratory studies on MyChart before your provider has had a chance to review them.  We understand that in some cases there may be results that are confusing or concerning to you. Not all laboratory results come back in the same time frame and the provider may be waiting for multiple results in order to interpret others.  Please give [place] 48 hours in order for your provider to thoroughly review all the results before contacting the office for clarification of your results.

## 2022-01-28 NOTE — Research (Addendum)
Christopher [name] Essence Week 5 Day 29 20-January-2022

## 2022-01-30 ENCOUNTER — Encounter: Payer: Self-pay | Admitting: Gastroenterology

## 2022-02-01 ENCOUNTER — Encounter: Payer: Self-pay | Admitting: Certified Registered Nurse Anesthetist

## 2022-02-03 ENCOUNTER — Other Ambulatory Visit: Payer: Self-pay | Admitting: Internal Medicine

## 2022-02-04 ENCOUNTER — Ambulatory Visit (AMBULATORY_SURGERY_CENTER): Payer: Medicare Other | Admitting: Gastroenterology

## 2022-02-04 ENCOUNTER — Encounter: Payer: Self-pay | Admitting: Gastroenterology

## 2022-02-04 VITALS — BP 139/85 | HR 56 | Temp 97.3°F | Resp 12 | Ht 70.0 in | Wt 231.0 lb

## 2022-02-04 DIAGNOSIS — D125 Benign neoplasm of sigmoid colon: Secondary | ICD-10-CM

## 2022-02-04 DIAGNOSIS — Z09 Encounter for follow-up examination after completed treatment for conditions other than malignant neoplasm: Secondary | ICD-10-CM | POA: Diagnosis not present

## 2022-02-04 DIAGNOSIS — Z800 Family history of malignant neoplasm of digestive organs: Secondary | ICD-10-CM

## 2022-02-04 DIAGNOSIS — Z86010 Personal history of colonic polyps: Secondary | ICD-10-CM | POA: Diagnosis not present

## 2022-02-04 DIAGNOSIS — D123 Benign neoplasm of transverse colon: Secondary | ICD-10-CM

## 2022-02-04 DIAGNOSIS — I2510 Atherosclerotic heart disease of native coronary artery without angina pectoris: Secondary | ICD-10-CM | POA: Diagnosis not present

## 2022-02-04 DIAGNOSIS — I509 Heart failure, unspecified: Secondary | ICD-10-CM | POA: Diagnosis not present

## 2022-02-04 MED ORDER — SODIUM CHLORIDE 0.9 % IV SOLN: 500.0000 mL | Freq: Once | INTRAVENOUS | Status: DC

## 2022-02-04 NOTE — Progress Notes (Signed)
See 01/28/2022 H&P, no changes

## 2022-02-04 NOTE — Op Note (Addendum)
[place] Patient Name: Christopher [name] Procedure Date: 02/04/2022 3:15 PM MRN: [id number] Endoscopist: Ladene Artist , MD Age: 71 Referring MD:  Date of Birth: 1950/11/02 Gender: Male Account #: [id number] Procedure:                Colonoscopy Indications:              Surveillance: Personal history of adenomatous                            polyps on last colonoscopy 3 years ago, Family                            history of colon cancer, first-degree relative Medicines:                Monitored Anesthesia Care Procedure:                Pre-Anesthesia Assessment:                           - Prior to the procedure, a History and Physical                            was performed, and patient medications and                            allergies were reviewed. The patient's tolerance of                            previous anesthesia was also reviewed. The risks                            and benefits of the procedure and the sedation                            options and risks were discussed with the patient.                            All questions were answered, and informed consent                            was obtained. Prior Anticoagulants: The patient has                            taken Plavix (clopidogrel), last dose was 5 days                            prior to procedure. ASA Grade Assessment: III - A                            patient with severe systemic disease. After                            reviewing the risks and benefits, the patient was  deemed in satisfactory condition to undergo the                            procedure.                           After obtaining informed consent, the colonoscope                            was passed under direct vision. Throughout the                            procedure, the patient's blood pressure, pulse, and                            oxygen saturations were monitored continuously. The                             CF [id number] #[id number] was introduced through the anus                            and advanced to the the cecum, identified by                            appendiceal orifice and ileocecal valve. The                            ileocecal valve, appendiceal orifice, and rectum                            were photographed. The quality of the bowel                            preparation was adequate after extensive lavage,                            suction. The colonoscopy was performed without                            difficulty. The patient tolerated the procedure                            well. Scope In: 3:22:40 PM Scope Out: 3:44:49 PM Scope Withdrawal Time: 0 hours 19 minutes 39 seconds  Total Procedure Duration: 0 hours 22 minutes 9 seconds  Findings:                 The perianal and digital rectal examinations were                            normal.                           A 3 mm polyp was found in the sigmoid colon. The  polyp was sessile. The polyp was removed with a                            cold biopsy forceps. Resection and retrieval were                            complete.                           Three sessile polyps were found in the sigmoid                            colon (1) and transverse colon (2). The polyps were                            6 to 8 mm in size. These polyps were removed with a                            cold snare. Resection and retrieval were complete.                           Internal hemorrhoids were found during                            retroflexion. The hemorrhoids were small and Grade                            I (internal hemorrhoids that do not prolapse).                           The exam was otherwise without abnormality on                            direct and retroflexion views. Complications:            No immediate complications. Estimated blood loss:                             None. Estimated Blood Loss:     Estimated blood loss: none. Impression:               - One 3 mm polyp in the sigmoid colon, removed with                            a cold biopsy forceps. Resected and retrieved.                           - Three 6 to 8 mm polyps in the sigmoid colon and                            in the transverse colon, removed with a cold snare.                            Resected and retrieved.                           -  Internal hemorrhoids.                           - The examination was otherwise normal on direct                            and retroflexion views. Recommendation:           - Consider repeat colonoscopy vs no repeat due to                            age and comorbidities after studies are complete                            for surveillance based on pathology results.                           - Patient has a contact number available for                            emergencies. The signs and symptoms of potential                            delayed complications were discussed with the                            patient. Return to normal activities tomorrow.                            Written discharge instructions were provided to the                            patient.                           - Resume previous diet.                           - Continue present medications.                           - Await pathology results.                           - Resume Plavix (clopidogrel) at prior dose in 2                            days. Refer to managing physician for further                            adjustment of therapy. Ladene Artist, MD 02/04/2022 3:49:53 PM This report has been signed electronically.

## 2022-02-04 NOTE — Progress Notes (Signed)
Pt's states no medical or surgical changes since previsit or office visit. 

## 2022-02-04 NOTE — Patient Instructions (Addendum)
Resume Plavix at prior dose in 2 days ( 02/06/2022)  Refer to managing physician for further adjustment of therapy    Handout on polyps & hemorrhoids  given to you today  Await pathology results of polyps removed    Resume prior diet & other medications ( except Plavix to restart in 2 days)    YOU HAD AN ENDOSCOPIC PROCEDURE TODAY AT [place]:   Refer to the procedure report that was given to you for any specific questions about what was found during the examination.  If the procedure report does not answer your questions, please call your gastroenterologist to clarify.  If you requested that your care partner not be given the details of your procedure findings, then the procedure report has been included in a sealed envelope for you to review at your convenience later.  YOU SHOULD EXPECT: Some feelings of bloating in the abdomen. Passage of more gas than usual.  Walking can help get rid of the air that was put into your GI tract during the procedure and reduce the bloating. If you had a lower endoscopy (such as a colonoscopy or flexible sigmoidoscopy) you may notice spotting of blood in your stool or on the toilet paper. If you underwent a bowel prep for your procedure, you may not have a normal bowel movement for a few days.  Please Note:  You might notice some irritation and congestion in your nose or some drainage.  This is from the oxygen used during your procedure.  There is no need for concern and it should clear up in a day or so.  SYMPTOMS TO REPORT IMMEDIATELY:  Following lower endoscopy (colonoscopy or flexible sigmoidoscopy):  Excessive amounts of blood in the stool  Significant tenderness or worsening of abdominal pains  Swelling of the abdomen that is new, acute  Fever of 100F or higher  For urgent or emergent issues, a gastroenterologist can be reached at any hour by calling [phone]. Do not use MyChart messaging for urgent concerns.     DIET:  We do recommend a small meal at first, but then you may proceed to your regular diet.  Drink plenty of fluids but you should avoid alcoholic beverages for 24 hours.  ACTIVITY:  You should plan to take it easy for the rest of today and you should NOT DRIVE or use heavy machinery until tomorrow (because of the sedation medicines used during the test).    FOLLOW UP: Our staff will call the number listed on your records the next business day following your procedure.  We will call around 7:15- 8:00 am to check on you and address any questions or concerns that you may have regarding the information given to you following your procedure. If we do not reach you, we will leave a message.  If you develop any symptoms (ie: fever, flu-like symptoms, shortness of breath, cough etc.) before then, please call [phone].  If you test positive for Covid 19 in the 2 weeks post procedure, please call and report this information to [place].    If any biopsies were taken you will be contacted by phone or by letter within the next 1-3 weeks.  Please call us at [phone] if you have not heard about the biopsies in 3 weeks.    SIGNATURES/CONFIDENTIALITY: You and/or your care partner have signed paperwork which will be entered into your electronic medical record.  These signatures attest to the fact that that the information  above on your After Visit Summary has been reviewed and is understood.  Full responsibility of the confidentiality of this discharge information lies with you and/or your care-partner.

## 2022-02-04 NOTE — Progress Notes (Signed)
Called to room to assist during endoscopic procedure.  Patient ID and intended procedure confirmed with present staff. Received instructions for my participation in the procedure from the performing physician.  

## 2022-02-05 ENCOUNTER — Telehealth: Payer: Self-pay | Admitting: *Deleted

## 2022-02-05 NOTE — Telephone Encounter (Signed)
Attempted f/u phone call. No answer. Left message. °

## 2022-02-06 ENCOUNTER — Other Ambulatory Visit: Payer: Self-pay | Admitting: Internal Medicine

## 2022-02-09 ENCOUNTER — Ambulatory Visit (INDEPENDENT_AMBULATORY_CARE_PROVIDER_SITE_OTHER): Payer: Medicare Other

## 2022-02-09 DIAGNOSIS — I5022 Chronic systolic (congestive) heart failure: Secondary | ICD-10-CM

## 2022-02-09 DIAGNOSIS — Z95810 Presence of automatic (implantable) cardiac defibrillator: Secondary | ICD-10-CM

## 2022-02-11 NOTE — Progress Notes (Signed)
EPIC Encounter for ICM Monitoring  Patient Name: Christopher [name] is a 71 y.o. male Date: 02/11/2022 Primary Care Physican: Hoyt Koch, MD Primary Cardiologist: [facility] Electrophysiologist: Allred 10/30/2021 Weight: 238 lbs (not weighing at home)    01/07/2022 Weight: 234 lbs       02/11/2022 Weight: 234 lbs                   Spoke with patient and heart failure questions reviewed.  Pt asymptomatic for fluid accumulation.   He has hip bone spur and having severe pain.  Following up with ortho tomorrow.    Optivol thoracic impedance suggesting normal fluid levels.     Spironolactone 25 mg take 0.5 tablet (12.5 total) daily   Labs: 04/18/2021 Creatinine 1.25, BUN 19, Potassium 3.9, Sodium 144, GFR 62 03/28/2021 Creatinine 1.28, BUN 21, Potassium 4.0, Sodium 140, GFR 60  A complete set of results can be found in Results Review.   Recommendations:  No changes and encouraged to call if experiencing any fluid symptoms.   Follow-up plan: ICM clinic phone appointment on 03/16/2022.   91 day device clinic remote transmission 05/18/2022.      EP/Cardiology Office Visits:  06/25/2022 with Dr Debara Pickett.  Recall 06/15/2022 with Oda Kilts.     Copy of ICM check sent to Dr. Rayann Heman.    3 month ICM trend: 02/09/2022.    12-14 Month ICM trend:     Rosalene Billings, RN 02/11/2022 2:41 PM

## 2022-02-12 DIAGNOSIS — M5431 Sciatica, right side: Secondary | ICD-10-CM | POA: Diagnosis not present

## 2022-02-16 ENCOUNTER — Ambulatory Visit: Payer: Medicare Other

## 2022-02-16 ENCOUNTER — Encounter: Payer: Self-pay | Admitting: *Deleted

## 2022-02-16 DIAGNOSIS — Z006 Encounter for examination for normal comparison and control in clinical research program: Secondary | ICD-10-CM

## 2022-02-16 NOTE — Research (Signed)
Spoke to Christopher [name] to remind him of his appointment tomorrow. Gave him parking code and reminded him to be Npo voices understanding.

## 2022-02-17 ENCOUNTER — Encounter: Payer: Medicare Other | Admitting: *Deleted

## 2022-02-17 VITALS — BP 140/90 | HR 69 | Temp 97.3°F | Resp 16

## 2022-02-17 DIAGNOSIS — Z006 Encounter for examination for normal comparison and control in clinical research program: Secondary | ICD-10-CM

## 2022-02-17 LAB — CUP PACEART REMOTE DEVICE CHECK
Battery Remaining Longevity: 78 mo
Battery Voltage: 2.97 V
Brady Statistic RV Percent Paced: 1.07 %
Date Time Interrogation Session: 20230815022724
HighPow Impedance: 70 Ohm
Implantable Lead Implant Date: 20181102
Implantable Lead Location: 753860
Implantable Pulse Generator Implant Date: 20181102
Lead Channel Impedance Value: 437 Ohm
Lead Channel Impedance Value: 513 Ohm
Lead Channel Pacing Threshold Amplitude: 0.5 V
Lead Channel Pacing Threshold Pulse Width: 0.4 ms
Lead Channel Sensing Intrinsic Amplitude: 5.75 mV
Lead Channel Sensing Intrinsic Amplitude: 5.75 mV
Lead Channel Setting Pacing Amplitude: 2 V
Lead Channel Setting Pacing Pulse Width: 0.4 ms
Lead Channel Setting Sensing Sensitivity: 0.3 mV

## 2022-02-17 MED ORDER — STUDY - ESSENCE - OLEZARSEN 50 MG, 80 MG OR PLACEBO SQ INJECTION (PI-HILTY)
80.0000 mg | INJECTION | Freq: Once | SUBCUTANEOUS | Status: AC
  Administered 2022-02-17: 80 mg via SUBCUTANEOUS
  Filled 2022-02-17: qty 0.8

## 2022-02-17 NOTE — Research (Signed)
  Juleen Starr Essence week 9 day 57 17-Feb-2022        Chemistry: Uric Acid 3.6 mg/dL             '[]'$ Clinically Significant  '[x]'$ Not Clinically Significant    Hematology: RDW 15.9 %                     '[]'$ Clinically Significant  '[x]'$ Not Clinically Significant   Urinalysis: Glucose >1000  mg/dL                    '[]'$ Clinically Significant  '[x]'$ Not Clinically Significant Protein 10 mg/dL                             '[]'$ Clinically Significant  '[x]'$ Not Clinically Significant Calcium Oxalate Crystals Present   '[]'$ Clinically Significant  '[x]'$ Not Clinically Significant  Urine Chemistry: Urine Albumin 4.47 mg/dL                         '[]'$ Clinically Significant  '[x]'$ Not Clinically Significant Albumin Creatinine Ratio  50 mg/g           '[]'$ Clinically Significant  '[x]'$ Not Clinically Significant     Any further action needed to be taken per the PI?  No    TREATMENT DAY 53 - STUDY WEEK-9    Subject Number: [id number]              Randomization Number: [id number]           Date:17-Feb-2022     '[x]'$ Vital Signs Collected - Blood Pressure:140/90 - Heart Rate:69 - Respiratory Rate:16 - Temperature:97.3 - Oxygen Saturation:97%   '[x]'$  Extended Urinalysis   '[x]'$ Lab collection per protocol  '[x]'$  (abdominal pain only) since last visit  '[x]'$  Assessment of ER Visits, Hospitalizations, and Inpatient Days  '[x]'$  Adverse Events and Concomitant Medications  '[x]'$  Diet, Lifestyle, and Alcohol Counseling   '[x]'$  Study Drug: [place] Injection     Christopher [name] here for Week 9 Day 57 essence research visit He reports no abd pain, or other pain, no visits to the ED or Urgent care since last visit. Vs taken at 1120, blood work at 1142, and urine obtained at 1133. Injection given in right lower abd at 1200. Scheduled next visit for Sept 12 at 1130.   Pixie Casino, MD, [facility], [place] Director of the Advanced Lipid Disorders &  Cardiovascular Risk Reduction Clinic Diplomate of the  American Board of Clinical Lipidology Attending Cardiologist  Direct Dial: [phone]  Fax: [phone]  Website:  [email or web address]

## 2022-02-19 ENCOUNTER — Telehealth: Payer: Medicare Other

## 2022-02-21 ENCOUNTER — Encounter: Payer: Self-pay | Admitting: Gastroenterology

## 2022-02-26 NOTE — Research (Addendum)
JATAYVION MONGIELLO Essence Week 9 Day [id number] 17-Feb-2022

## 2022-02-27 DIAGNOSIS — M7061 Trochanteric bursitis, right hip: Secondary | ICD-10-CM | POA: Diagnosis not present

## 2022-03-06 ENCOUNTER — Encounter: Payer: Self-pay | Admitting: Internal Medicine

## 2022-03-06 DIAGNOSIS — D485 Neoplasm of uncertain behavior of skin: Secondary | ICD-10-CM | POA: Diagnosis not present

## 2022-03-06 DIAGNOSIS — I8391 Asymptomatic varicose veins of right lower extremity: Secondary | ICD-10-CM | POA: Diagnosis not present

## 2022-03-06 DIAGNOSIS — D2371 Other benign neoplasm of skin of right lower limb, including hip: Secondary | ICD-10-CM | POA: Diagnosis not present

## 2022-03-06 DIAGNOSIS — L905 Scar conditions and fibrosis of skin: Secondary | ICD-10-CM | POA: Diagnosis not present

## 2022-03-06 DIAGNOSIS — D225 Melanocytic nevi of trunk: Secondary | ICD-10-CM | POA: Diagnosis not present

## 2022-03-06 DIAGNOSIS — L570 Actinic keratosis: Secondary | ICD-10-CM | POA: Diagnosis not present

## 2022-03-06 DIAGNOSIS — D2271 Melanocytic nevi of right lower limb, including hip: Secondary | ICD-10-CM | POA: Diagnosis not present

## 2022-03-06 DIAGNOSIS — L821 Other seborrheic keratosis: Secondary | ICD-10-CM | POA: Diagnosis not present

## 2022-03-06 DIAGNOSIS — Z85820 Personal history of malignant melanoma of skin: Secondary | ICD-10-CM | POA: Diagnosis not present

## 2022-03-06 DIAGNOSIS — D1801 Hemangioma of skin and subcutaneous tissue: Secondary | ICD-10-CM | POA: Diagnosis not present

## 2022-03-16 ENCOUNTER — Encounter: Payer: Self-pay | Admitting: *Deleted

## 2022-03-16 ENCOUNTER — Ambulatory Visit (INDEPENDENT_AMBULATORY_CARE_PROVIDER_SITE_OTHER): Payer: Medicare Other

## 2022-03-16 ENCOUNTER — Other Ambulatory Visit: Payer: Self-pay

## 2022-03-16 DIAGNOSIS — Z95810 Presence of automatic (implantable) cardiac defibrillator: Secondary | ICD-10-CM

## 2022-03-16 DIAGNOSIS — Z006 Encounter for examination for normal comparison and control in clinical research program: Secondary | ICD-10-CM

## 2022-03-16 DIAGNOSIS — I5022 Chronic systolic (congestive) heart failure: Secondary | ICD-10-CM

## 2022-03-16 DIAGNOSIS — E118 Type 2 diabetes mellitus with unspecified complications: Secondary | ICD-10-CM

## 2022-03-16 MED ORDER — ACCU-CHEK GUIDE VI STRP: ORAL_STRIP | 12 refills | Status: DC

## 2022-03-16 MED ORDER — ACCU-CHEK SOFTCLIX LANCETS MISC: 12 refills | Status: DC

## 2022-03-16 NOTE — Research (Signed)
Spoke with Christopher [name] to remind him of his appointment tomorrow with Research. Gave him parking code and reminded to be NPO. Voices understanding.

## 2022-03-17 ENCOUNTER — Encounter: Payer: Medicare Other | Admitting: *Deleted

## 2022-03-17 VITALS — BP 124/64 | HR 67 | Temp 97.4°F | Resp 18

## 2022-03-17 DIAGNOSIS — Z006 Encounter for examination for normal comparison and control in clinical research program: Secondary | ICD-10-CM

## 2022-03-17 MED ORDER — STUDY - ESSENCE - OLEZARSEN 50 MG, 80 MG OR PLACEBO SQ INJECTION (PI-HILTY)
80.0000 mg | INJECTION | Freq: Once | SUBCUTANEOUS | Status: AC
  Administered 2022-03-17: 80 mg via SUBCUTANEOUS
  Filled 2022-03-17: qty 0.8

## 2022-03-17 NOTE — Research (Addendum)
Juleen Starr  Week 13 Day [id number] Essence 12-Sept-2023     TREATMENT DAY 85 - STUDY WEEK 13    Subject Number: [id number]            Randomization GF:[id number]            Date:12-Sept-2023     '[x]'$ Vital Signs Collected - Blood Pressure: 124/64 - Heart Rate:67 - Respiratory Rate:18 - Temperature:97.4 - Oxygen Saturation:98%   '[x]'$  Extended Urinalysis   '[x]'$  Lab collection per protocol  '[x]'$  (abdominal pain only) since last visit  '[x]'$  Assessment of ER Visits, Hospitalizations, and Inpatient Days  '[x]'$  Adverse Events and Concomitant Medications  '[x]'$  Diet, Lifestyle, and Alcohol Counseling   '[x]'$  Study Drug:  Injection    Mr Zambo is here for week 13 day 44 essence visit. He reports no Ed or Urgent care visits since last seen. No abd pain. No changes in medications.  VS taken at 1143, blood drawn at 1158, and urine obtained at 1148. Injection given in left lower abd tol well. Next visit scheduled for Oct 11 at 1130.

## 2022-03-18 NOTE — Progress Notes (Signed)
EPIC Encounter for ICM Monitoring  Patient Name: Christopher [name] is a 71 y.o. male Date: 03/18/2022 Primary Care Physican: Hoyt Koch, MD Primary Cardiologist: [facility] Electrophysiologist: Curt Bears 10/30/2021 Weight: 238 lbs (not weighing at home)    01/07/2022 Weight: 234 lbs       02/11/2022 Weight: 234 lbs           03/18/2022 Weight: 234-235 lbs         Spoke with patient and heart failure questions reviewed.  Pt asymptomatic for fluid accumulation.   He reports hip pain from nerve impingement and will receive steroid shot.    Optivol thoracic impedance suggesting normal fluid levels.     Spironolactone 25 mg take 0.5 tablet (12.5 total) daily   Labs: 04/18/2021 Creatinine 1.25, BUN 19, Potassium 3.9, Sodium 144, GFR 62 03/28/2021 Creatinine 1.28, BUN 21, Potassium 4.0, Sodium 140, GFR 60  A complete set of results can be found in Results Review.   Recommendations:  No changes and encouraged to call if experiencing any fluid symptoms.   Follow-up plan: ICM clinic phone appointment on 04/20/2022.   91 day device clinic remote transmission 05/19/2022.      EP/Cardiology Office Visits:  06/25/2022 with Dr Debara Pickett.  Recall 06/15/2022 with Oda Kilts.     Copy of ICM check sent to Dr. Curt Bears.    3 month ICM trend: 04/20/2022.    12-14 Month ICM trend:     Rosalene Billings, RN 03/18/2022 2:01 PM

## 2022-03-19 NOTE — Research (Addendum)
Tarin Navarez Essence Week 13 day 85 12-Sept-2023         Chemistry: Glucose  120   mg/dL              '[]'$ Clinically Significant  '[x]'$ Not Clinically Significant Uric Acid 3.7  mg/dL                '[]'$ Clinically Significant  '[x]'$ Not Clinically Significant Alkaline Phosphatase 38  U/L  '[]'$ Clinically Significant  '[x]'$ Not Clinically Significant   Hematology: RDW 16.4 %                           '[]'$ Clinically Significant  '[x]'$ Not Clinically Significant   Urinalysis: Glucose  >1000    mg/dL            '[]'$ Clinically Significant  '[x]'$ Not Clinically Significant  Urine Chemistry:  Albumin Creatinine Ratio 36 mg/g     '[]'$ Clinically Significant  '[x]'$ Not Clinically Significant      Any further action needed to be taken per the PI?  No  Pixie Casino, MD, [facility], [place] Director of the Advanced Lipid Disorders &  Cardiovascular Risk Reduction Clinic Diplomate of the American Board of Clinical Lipidology Attending Cardiologist  Direct Dial: [phone]  Fax: [phone]  Website:  [email or web address]

## 2022-03-20 NOTE — Research (Addendum)
ZARON ZWIEFELHOFER Essence Week 13 Day 67 12-Sept-2023         Chemistry: Glucose 120    mg/dL               '[]'$ Clinically Significant  '[x]'$ Not Clinically Significant Uric Acid 3.7 mg/dL                  '[]'$ Clinically Significant  '[x]'$ Not Clinically Significant Alkaline Phosphatase  38 U/L  '[]'$ Clinically Significant  '[x]'$ Not Clinically Significant   Hematology:   RDW 16.4 %                         '[]'$ Clinically Significant  '[x]'$ Not Clinically Significant   Urinalysis: Glucose  >1000    mg/dL            '[]'$ Clinically Significant  '[x]'$ Not Clinically Significant  Urine Chemistry: Albumin Creatinine Ratio 36 mg/g      '[]'$ Clinically Significant  '[x]'$ Not Clinically Significant     Any further action needed to be taken per the PI?  No  Pixie Casino, MD, [facility], [place] Director of the Advanced Lipid Disorders &  Cardiovascular Risk Reduction Clinic Diplomate of the American Board of Clinical Lipidology Attending Cardiologist  Direct Dial: [phone]  Fax: [phone]  Website:  [email or web address]

## 2022-03-30 NOTE — Research (Addendum)
TISON LEIBOLD Essence Week 13 Day 44 12-Sept-2023  Mr Passey signed new Essence consent after having time to review and ask questions. Signed at 1230

## 2022-04-01 DIAGNOSIS — M5431 Sciatica, right side: Secondary | ICD-10-CM | POA: Diagnosis not present

## 2022-04-06 ENCOUNTER — Other Ambulatory Visit: Payer: Self-pay | Admitting: Internal Medicine

## 2022-04-15 ENCOUNTER — Other Ambulatory Visit: Payer: Self-pay

## 2022-04-15 ENCOUNTER — Encounter: Payer: Self-pay | Admitting: *Deleted

## 2022-04-15 ENCOUNTER — Encounter: Payer: Medicare Other | Admitting: *Deleted

## 2022-04-15 DIAGNOSIS — Z006 Encounter for examination for normal comparison and control in clinical research program: Secondary | ICD-10-CM

## 2022-04-15 MED ORDER — STUDY - ESSENCE - OLEZARSEN 50 MG, 80 MG OR PLACEBO SQ INJECTION (PI-HILTY)
80.0000 mg | INJECTION | Freq: Once | SUBCUTANEOUS | Status: AC
  Administered 2022-04-15: 80 mg via SUBCUTANEOUS
  Filled 2022-04-15: qty 0.8

## 2022-04-15 NOTE — Research (Signed)
     TREATMENT Week 17 Day 113    Subject Number:  [id number]             Randomization Number: [id number]           Date:15-Apr-2022   '[x]'$  Assessment of ER Visits, Hospitalizations, and Inpatient Days  '[x]'$  Adverse Events and Concomitant Medications  '[x]'$  Diet, Lifestyle, and Alcohol Counseling   '[x]'$  Study Drug: [place] Injection    Christopher [name] here for week 17 essence visit. He reports no abd pain, or any pain, no medication changes, and no ed or urgent care visit. Next appointment scheduled for Nov 8 at 1130. Injection given in left abd. Tol well.

## 2022-04-16 ENCOUNTER — Other Ambulatory Visit: Payer: Self-pay | Admitting: *Deleted

## 2022-04-16 MED ORDER — SPIRONOLACTONE 25 MG PO TABS: 12.5000 mg | ORAL_TABLET | Freq: Every day | ORAL | 1 refills | Status: DC

## 2022-04-20 ENCOUNTER — Ambulatory Visit (INDEPENDENT_AMBULATORY_CARE_PROVIDER_SITE_OTHER): Payer: Medicare Other

## 2022-04-20 DIAGNOSIS — Z95810 Presence of automatic (implantable) cardiac defibrillator: Secondary | ICD-10-CM | POA: Diagnosis not present

## 2022-04-20 DIAGNOSIS — I5022 Chronic systolic (congestive) heart failure: Secondary | ICD-10-CM

## 2022-04-22 DIAGNOSIS — H25013 Cortical age-related cataract, bilateral: Secondary | ICD-10-CM | POA: Diagnosis not present

## 2022-04-23 LAB — HM DIABETES EYE EXAM

## 2022-04-28 NOTE — Progress Notes (Signed)
EPIC Encounter for ICM Monitoring  Patient Name: Christopher [name] is a 71 y.o. male Date: 04/28/2022 Primary Care Physican: Hoyt Koch, MD Primary Cardiologist: [facility] Electrophysiologist: Curt Bears 10/30/2021 Weight: 238 lbs (not weighing at home)    01/07/2022 Weight: 234 lbs       02/11/2022 Weight: 234 lbs           03/18/2022 Weight: 234-235 lbs         Transmission reviewed.    Optivol thoracic impedance suggesting normal fluid levels.     Spironolactone 25 mg take 0.5 tablet (12.5 total) daily   Labs: 04/18/2021 Creatinine 1.25, BUN 19, Potassium 3.9, Sodium 144, GFR 62 03/28/2021 Creatinine 1.28, BUN 21, Potassium 4.0, Sodium 140, GFR 60  A complete set of results can be found in Results Review.   Recommendations:  No changes.   Follow-up plan: ICM clinic phone appointment on 05/25/2022.   91 day device clinic remote transmission 05/19/2022.      EP/Cardiology Office Visits:  06/25/2022 with Dr Debara Pickett.  Recall 06/15/2022 with Oda Kilts.     Copy of ICM check sent to Dr. Curt Bears.    3 month ICM trend: 04/21/2022.    12-14 Month ICM trend:     Rosalene Billings, RN 04/28/2022 11:06 AM

## 2022-05-12 ENCOUNTER — Encounter: Payer: Self-pay | Admitting: *Deleted

## 2022-05-12 DIAGNOSIS — Z006 Encounter for examination for normal comparison and control in clinical research program: Secondary | ICD-10-CM

## 2022-05-12 NOTE — Research (Signed)
  Spoke with Christopher [name] reminded him of his appointment tomorrow at 1130,  to be NPO, and gave him the parking code. Voices understanding.

## 2022-05-13 ENCOUNTER — Other Ambulatory Visit: Payer: Self-pay

## 2022-05-13 ENCOUNTER — Encounter: Payer: Medicare Other | Admitting: *Deleted

## 2022-05-13 VITALS — BP 122/81 | HR 67 | Temp 97.2°F | Resp 18

## 2022-05-13 DIAGNOSIS — Z006 Encounter for examination for normal comparison and control in clinical research program: Secondary | ICD-10-CM

## 2022-05-13 MED ORDER — STUDY - ESSENCE - OLEZARSEN 50 MG, 80 MG OR PLACEBO SQ INJECTION (PI-HILTY)
80.0000 mg | INJECTION | SUBCUTANEOUS | Status: DC
  Administered 2022-05-13: 80 mg via SUBCUTANEOUS
  Filled 2022-05-13: qty 0.8

## 2022-05-13 NOTE — Research (Signed)
TREATMENT DAY 141 - STUDY WEEK- 21    Subject Number: [id number]             Randomization Number: [id number]            Date:13-May-2022      _0 Vital Signs Collected - Blood Pressure:122/81 - Heart Rate:67  - Respiratory Rate:18 - Temperature:97.2 - Oxygen Saturation:99%      _1   (abdominal pain only) since last visit  _2  Assessment of ER Visits, Hospitalizations, and Inpatient Days  _3  Adverse Events and Concomitant Medications  _4  Diet, Lifestyle, and Alcohol Counseling   _5  Study Drug: [place] Injection   Christopher [name] here for Week 21 Day 141 of the Essence research study.  Reports no abd pain, no visits to the Ed or urgent care, and no medication changes. Vs taken at 1136, Injection given at 1232 in left lower abd. Tol well.   BP 122/81              HR 67 O2sat 99%             Temp 97.2 Resp 18   Current Outpatient Medications:    Accu-Chek Softclix Lancets lancets, Use as instructed, Disp: 100 each, Rfl: 12   acetaminophen (TYLENOL) 500 MG tablet, Take 500 mg by mouth every 6 (six) hours as needed for mild pain or headache. , Disp: , Rfl:    atorvastatin (LIPITOR) 40 MG tablet, TAKE 1 TABLET BY MOUTH  DAILY, Disp: 100 tablet, Rfl: 2   blood glucose meter kit and supplies KIT, Dispense based on patient and insurance preference. Use up to four times daily as directed., Disp: 1 each, Rfl: 0   cetirizine (ZYRTEC) 10 MG tablet, Take 10 mg by mouth daily., Disp: , Rfl:    cholecalciferol (VITAMIN D) 25 MCG (1000 UNIT) tablet, Take 1 tablet by mouth daily. , Disp: , Rfl:    clopidogrel (PLAVIX) 75 MG tablet, TAKE 1 TABLET BY MOUTH  DAILY, Disp: 100 tablet, Rfl: 2   ENTRESTO 97-103 MG, TAKE 1 TABLET BY MOUTH TWICE A DAY, Disp: 180 tablet, Rfl: 3   FARXIGA 10 MG TABS tablet, TAKE 1 TABLET BY MOUTH DAILY BEFORE BREAKFAST., Disp: 90 tablet, Rfl: 3   fenofibrate (TRICOR) 145 MG tablet, TAKE 1 TABLET BY MOUTH  DAILY, Disp: 90 tablet, Rfl: 3   fluticasone (FLONASE) 50 MCG/ACT  nasal spray, USE 2 SPRAYS IN BOTH NOSTRILS  DAILY, Disp: 64 g, Rfl: 0   glucose blood (ACCU-CHEK GUIDE) test strip, Use as instructed, Disp: 100 each, Rfl: 12   Insulin Lispro Prot & Lispro (HUMALOG MIX 75/25 KWIKPEN) (75-25) 100 UNIT/ML Kwikpen, Inject 20 Units into the skin in the morning and at bedtime., Disp: 45 mL, Rfl: 2   metFORMIN (GLUCOPHAGE) 1000 MG tablet, TAKE 1 TABLET BY MOUTH  TWICE DAILY WITH A MEAL, Disp: 180 tablet, Rfl: 3   metoprolol succinate (TOPROL-XL) 100 MG 24 hr tablet, TAKE 1 TABLET BY MOUTH DAILY  IMMEDIATELY FOLLOWING A MEAL, Disp: 100 tablet, Rfl: 2   OVER THE COUNTER MEDICATION, 1 tablet in the morning and at bedtime. [facility] - Restore multivitamin, Disp: , Rfl:    sildenafil (REVATIO) 20 MG tablet, Take 1 tablet (20 mg total) by mouth daily as needed. DO NOT USE with nitrates, Disp: 10 tablet, Rfl: 0   spironolactone (ALDACTONE) 25 MG tablet, Take 0.5 tablets (12.5 mg total) by mouth daily., Disp: 45 tablet, Rfl: 1   Study - ESSENCE -  olezarsen 50 mg, 80 mg or placebo SQ injection (PI-Hilty), Inject 80 mg into the skin every 30 (thirty) days. For Investigational Use Only. Injection subcutaneously in protocol approved injection sites (abdomen, thigh or outer area of upper arm) every 4 weeks. Please contact [facility] for any questions or concerns regarding this medication., Disp: , Rfl:    tamsulosin (FLOMAX) 0.4 MG CAPS capsule, TAKE 1 CAPSULE BY MOUTH  DAILY, Disp: 100 capsule, Rfl: 2   metoprolol tartrate (LOPRESSOR) 100 MG tablet, Take 1 tablet (100 mg total) by mouth once for 1 dose. Please take one time dose 132m metoprolol tartrate 2 hr prior to cardiac CT for HR control IF HR >55bpm., Disp: 1 tablet, Rfl: 0  Current Facility-Administered Medications:    Study - ESSENCE - olezarsen 50 mg, 80 mg or placebo SQ injection (PI-Hilty), 80 mg, Subcutaneous, Q28 days, Hilty, KNadean Corwin MD

## 2022-05-18 ENCOUNTER — Ambulatory Visit (INDEPENDENT_AMBULATORY_CARE_PROVIDER_SITE_OTHER): Payer: Medicare Other

## 2022-05-18 DIAGNOSIS — I5022 Chronic systolic (congestive) heart failure: Secondary | ICD-10-CM | POA: Diagnosis not present

## 2022-05-19 LAB — CUP PACEART REMOTE DEVICE CHECK
Battery Remaining Longevity: 73 mo
Battery Voltage: 2.97 V
Brady Statistic RV Percent Paced: 0.3 %
Date Time Interrogation Session: 20231114044225
HighPow Impedance: 77 Ohm
Implantable Lead Connection Status: 753985
Implantable Lead Implant Date: 20181102
Implantable Lead Location: 753860
Implantable Pulse Generator Implant Date: 20181102
Lead Channel Impedance Value: 475 Ohm
Lead Channel Impedance Value: 570 Ohm
Lead Channel Pacing Threshold Amplitude: 0.625 V
Lead Channel Pacing Threshold Pulse Width: 0.4 ms
Lead Channel Sensing Intrinsic Amplitude: 6.125 mV
Lead Channel Sensing Intrinsic Amplitude: 6.125 mV
Lead Channel Setting Pacing Amplitude: 2 V
Lead Channel Setting Pacing Pulse Width: 0.4 ms
Lead Channel Setting Sensing Sensitivity: 0.3 mV
Zone Setting Status: 755011
Zone Setting Status: 755011

## 2022-05-21 ENCOUNTER — Telehealth: Payer: Medicare Other

## 2022-05-22 ENCOUNTER — Ambulatory Visit (INDEPENDENT_AMBULATORY_CARE_PROVIDER_SITE_OTHER): Payer: Medicare Other | Admitting: Internal Medicine

## 2022-05-22 ENCOUNTER — Encounter: Payer: Self-pay | Admitting: Internal Medicine

## 2022-05-22 VITALS — BP 140/86 | HR 76 | Temp 97.5°F | Wt 236.0 lb

## 2022-05-22 DIAGNOSIS — I10 Essential (primary) hypertension: Secondary | ICD-10-CM | POA: Diagnosis not present

## 2022-05-22 DIAGNOSIS — M545 Low back pain, unspecified: Secondary | ICD-10-CM

## 2022-05-22 DIAGNOSIS — G8929 Other chronic pain: Secondary | ICD-10-CM | POA: Diagnosis not present

## 2022-05-22 DIAGNOSIS — E118 Type 2 diabetes mellitus with unspecified complications: Secondary | ICD-10-CM | POA: Diagnosis not present

## 2022-05-22 MED ORDER — "PEN NEEDLES 5/16"" 31G X 8 MM MISC": 11 refills | Status: DC

## 2022-05-22 MED ORDER — MELOXICAM 15 MG PO TABS: 15.0000 mg | ORAL_TABLET | Freq: Every day | ORAL | 3 refills | Status: DC

## 2022-05-22 NOTE — Assessment & Plan Note (Addendum)
Recent labs from summer from house call reviewed 5.2 and appropriate for monitoring again in 6 months. No low sugars. Will continue humalog 75/25 20 units BID and metformin 1000 mg BID and farxiga 10 mg daily. Continue without change today. If low sugars will reduce insulin in future.

## 2022-05-22 NOTE — Assessment & Plan Note (Signed)
New complaint today but going on for months. Got steroid injection end of September and this did not help. Rx meloxicam 15 mg daily and this was helping well previously. The pain is impacting his QOL and function.

## 2022-05-22 NOTE — Assessment & Plan Note (Signed)
BP is at goal last 3 visits with study in 120s/60s. Will not adjust regimen although borderline BP today. Continue entresto 97/103 mg BID and metoprolol 100 mg daily and spironolactone 12.5 mg daily. Recent BMP at goal.

## 2022-05-22 NOTE — Progress Notes (Signed)
   Subjective:   Patient ID: Christopher [name], male    DOB: [birth year], 71 y.o.   MRN: [id number]  HPI The patient is a 71 YO man coming in for follow up and new low back pain.  Review of Systems  Constitutional: Negative.   HENT: Negative.    Eyes: Negative.   Respiratory:  Negative for cough, chest tightness and shortness of breath.   Cardiovascular:  Negative for chest pain, palpitations and leg swelling.  Gastrointestinal:  Negative for abdominal distention, abdominal pain, constipation, diarrhea, nausea and vomiting.  Musculoskeletal:  Positive for back pain.  Skin: Negative.   Neurological: Negative.   Psychiatric/Behavioral: Negative.      Objective:  Physical Exam Constitutional:      Appearance: He is well-developed. He is obese.  HENT:     Head: Normocephalic and atraumatic.  Cardiovascular:     Rate and Rhythm: Normal rate and regular rhythm.  Pulmonary:     Effort: Pulmonary effort is normal. No respiratory distress.     Breath sounds: Normal breath sounds. No wheezing or rales.  Abdominal:     General: Bowel sounds are normal. There is no distension.     Palpations: Abdomen is soft.     Tenderness: There is no abdominal tenderness. There is no rebound.  Musculoskeletal:        General: Tenderness present.     Cervical back: Normal range of motion.  Skin:    General: Skin is warm and dry.  Neurological:     Mental Status: He is alert and oriented to person, place, and time.     Coordination: Coordination normal.     Vitals:   05/22/22 1339 05/22/22 1342  BP: (!) 140/86 (!) 140/86  Pulse: 76   Temp: (!) 97.5 F (36.4 C)   TempSrc: Oral   SpO2: 98%   Weight: 236 lb (107 kg)     Assessment & Plan:

## 2022-05-22 NOTE — Patient Instructions (Signed)
We have sent in meloxicam to use daily for the back pain.  We have sent in the pen needles.

## 2022-05-25 ENCOUNTER — Ambulatory Visit (INDEPENDENT_AMBULATORY_CARE_PROVIDER_SITE_OTHER): Payer: Medicare Other

## 2022-05-25 DIAGNOSIS — I5022 Chronic systolic (congestive) heart failure: Secondary | ICD-10-CM | POA: Diagnosis not present

## 2022-05-25 DIAGNOSIS — H353132 Nonexudative age-related macular degeneration, bilateral, intermediate dry stage: Secondary | ICD-10-CM | POA: Diagnosis not present

## 2022-05-25 DIAGNOSIS — H2512 Age-related nuclear cataract, left eye: Secondary | ICD-10-CM | POA: Diagnosis not present

## 2022-05-25 DIAGNOSIS — Z95810 Presence of automatic (implantable) cardiac defibrillator: Secondary | ICD-10-CM

## 2022-05-27 NOTE — Progress Notes (Signed)
EPIC Encounter for ICM Monitoring  Patient Name: Christopher [name] is a 71 y.o. male Date: 05/27/2022 Primary Care Physican: Hoyt Koch, MD Primary Cardiologist: [facility] Electrophysiologist: Curt Bears 10/30/2021 Weight: 238 lbs (not weighing at home)    01/07/2022 Weight: 234 lbs       02/11/2022 Weight: 234 lbs           03/18/2022 Weight: 234-235 lbs     05/27/2022 Weight: 234 lbs     Spoke with patient and heart failure questions reviewed.  Transmission results reviewed.  Pt asymptomatic for fluid accumulation.  Reports feeling well at this time and voices no complaints.      Optivol thoracic impedance suggesting normal fluid levels.     Spironolactone 25 mg take 0.5 tablet (12.5 total) daily   Labs: 04/18/2021 Creatinine 1.25, BUN 19, Potassium 3.9, Sodium 144, GFR 62 03/28/2021 Creatinine 1.28, BUN 21, Potassium 4.0, Sodium 140, GFR 60  A complete set of results can be found in Results Review.   Recommendations:  No changes and encouraged to call if experiencing any fluid symptoms.   Follow-up plan: ICM clinic phone appointment on 07/13/2022.   91 day device clinic remote transmission 08/17/2022.      EP/Cardiology Office Visits:  06/25/2022 with Dr Debara Pickett.  Recall 06/15/2022 with Oda Kilts.     Copy of ICM check sent to Dr. Curt Bears.     3 month ICM trend: 05/25/2022.    12-14 Month ICM trend:     Rosalene Billings, RN 05/27/2022 4:14 PM

## 2022-05-30 ENCOUNTER — Other Ambulatory Visit: Payer: Self-pay | Admitting: Internal Medicine

## 2022-06-03 ENCOUNTER — Telehealth: Payer: Self-pay | Admitting: Internal Medicine

## 2022-06-03 NOTE — Telephone Encounter (Signed)
   Pre-operative Risk Assessment    Patient Name: Christopher [name]  DOB: 1950-12-10 MRN: [id number]      Request for Surgical Clearance    Procedure:   Cataract Surgery   Date of Surgery:  Clearance 06/15/22                                 Surgeon:  Dr. Jamal Collin Group or Practice Name:  [facility]  Phone number:  [phone] Fax number:  [phone]    Type of Clearance Requested:   Medical    Type of Anesthesia:   IV Sedation    Additional requests/questions:    [name]   06/03/2022, 4:50 PM

## 2022-06-04 NOTE — Telephone Encounter (Signed)
Patient with a known diagnosis of paroxysmal atrial fibrillation.  He may continue/proceed with outlined procedure without further cardiac testing.  Jossie Ng. Kameron Blethen NP-C     06/04/2022, 1:17 PM [place] [address] Office [phone] Fax [phone]

## 2022-06-04 NOTE — Telephone Encounter (Signed)
   Patient Name: Christopher [name]  DOB: September 09, 1950 MRN: [id number]  Primary Cardiologist: Pixie Casino, MD  Chart reviewed as part of pre-operative protocol coverage. Cataract extractions are recognized in guidelines as low risk surgeries that do not typically require specific preoperative testing or holding of blood thinner therapy. Therefore, given past medical history and time since last visit, based on ACC/AHA guidelines, Christopher [name] would be at acceptable risk for the planned procedure without further cardiovascular testing.   I will route this recommendation to the requesting party via Epic fax function and remove from pre-op pool.  Please call with questions.  Mayra Reel, NP 06/04/2022, 11:48 AM

## 2022-06-04 NOTE — Telephone Encounter (Signed)
[employer] PA called and wanted to add to the clearance that patient heart rhyme was irregular at the time of the assessment  Her contact number if needed is [phone]

## 2022-06-09 ENCOUNTER — Encounter: Payer: Self-pay | Admitting: *Deleted

## 2022-06-09 ENCOUNTER — Other Ambulatory Visit: Payer: Self-pay | Admitting: Internal Medicine

## 2022-06-09 DIAGNOSIS — Z006 Encounter for examination for normal comparison and control in clinical research program: Secondary | ICD-10-CM

## 2022-06-09 NOTE — Research (Signed)
Message left for Christopher [name] to remind him of his appointment tomorrow at 1130, also gave him the parking code.

## 2022-06-09 NOTE — Progress Notes (Deleted)
Message left to remind Christopher [name] of his appointment tomorrow at 1130.gave parking code

## 2022-06-10 ENCOUNTER — Encounter: Payer: Medicare Other | Admitting: *Deleted

## 2022-06-10 ENCOUNTER — Other Ambulatory Visit: Payer: Self-pay

## 2022-06-10 DIAGNOSIS — Z006 Encounter for examination for normal comparison and control in clinical research program: Secondary | ICD-10-CM

## 2022-06-10 MED ORDER — STUDY - ESSENCE - OLEZARSEN 50 MG, 80 MG OR PLACEBO SQ INJECTION (PI-HILTY)
80.0000 mg | INJECTION | SUBCUTANEOUS | Status: DC
  Administered 2022-06-10: 80 mg via SUBCUTANEOUS
  Filled 2022-06-10: qty 0.8

## 2022-06-10 NOTE — Research (Addendum)
Christopher [name]  Week 25 Day 169 10-Jun-2022       [place] 25 Day 169 of Essence 10-Jun-2022          Chemistry: BUN 23 mg/dL                                       _0 Clinically Significant  _1 Not Clinically Significant  Uric Acid 3.5 mg/dL                               _2 Clinically Significant  _3 Not Clinically Significant Insulin 29.9                                            _4 Clinically Significant  _5 Not Clinically Significant    Urinalysis: Glucose >1000      mg/dL                        _6 Clinically Significant  _7 Not Clinically Significant Specific Gravity 1.040                             _8 Clinically Significant  _9 Not Clinically Significant Protein 20 mg/dL                                     _10 Clinically Significant  _11 Not Clinically Significant Uric Acid Crystals present                       _12 Clinically Significant  _13 Not Clinically Significant Calcium Oxalate Crystals  Present          _14 Clinically Significant  _15 Not Clinically Significant Mucus 1+                                                 _16 Clinically Significant  _17 Not Clinically Significant  Urine Chemistry: Urine Albumin 4.88 mg/dL                         _18 Clinically Significant  _19 Not Clinically Significant Albumin Creatinine Ratio  34 mg/g            _20 Clinically Significant  _21 Not Clinically Significant     Any further action needed to be taken per the PI?  No  Pixie Casino, MD, [facility], [place] Director of the Advanced Lipid Disorders &  Cardiovascular Risk Reduction Clinic Diplomate of the American Board of Clinical Lipidology Attending Cardiologist  Direct Dial: [phone]  Fax: [phone]  Website:  [email or web address]    TREATMENT DAY 169 - STUDY WEEK 25    Subject Number: [id number]            Randomization [id number]             Date: 10-Jun-2022     _22 Vital Signs Collected - Blood Pressure:134/73 -  Height:5 ft 10 in - Weight:238.4 lbs - Heart Rate:61 - Respiratory Rate:16 - Temperature: 97.2 -  Oxygen Saturation:97%  _0  Physical Exam Completed by PI or SUB-I  _1  Extended Urinalysis   _2  Lab collection per protocol  _3   (abdominal pain only) since last visit  _4  Assessment of ER Visits, Hospitalizations, and Inpatient Days  _5  Adverse Events and Concomitant Medications  _6  Diet, Lifestyle, and Alcohol Counseling   _7  Study Drug: [place] Injection    Christopher [name] here for Week 25 essence visit. He reports no abd pain, no visits to the Ed or urgent care. No medications changes. Vs taken at 1137, blood drawn at 1144. Dr Lia Foyer did exam. Urine obtained at 1223. Injection given at 1224 in left lower abd. Tol well. Next visit scheduled for Jan 3 at 1130.        Current Outpatient Medications:    Accu-Chek Softclix Lancets lancets, Use as instructed, Disp: 100 each, Rfl: 12   acetaminophen (TYLENOL) 500 MG tablet, Take 500 mg by mouth every 6 (six) hours as needed for mild pain or headache. , Disp: , Rfl:    atorvastatin (LIPITOR) 40 MG tablet, TAKE 1 TABLET BY MOUTH  DAILY, Disp: 100 tablet, Rfl: 2   blood glucose meter kit and supplies KIT, Dispense based on patient and insurance preference. Use up to four times daily as directed., Disp: 1 each, Rfl: 0   cetirizine (ZYRTEC) 10 MG tablet, Take 10 mg by mouth daily., Disp: , Rfl:    cholecalciferol (VITAMIN D) 25 MCG (1000 UNIT) tablet, Take 1 tablet by mouth daily. , Disp: , Rfl:    clopidogrel (PLAVIX) 75 MG tablet, TAKE 1 TABLET BY MOUTH  DAILY, Disp: 100 tablet, Rfl: 2   ENTRESTO 97-103 MG, TAKE 1 TABLET BY MOUTH TWICE A DAY, Disp: 180 tablet, Rfl: 3   FARXIGA 10 MG TABS tablet, TAKE 1 TABLET BY MOUTH DAILY BEFORE BREAKFAST., Disp: 90 tablet, Rfl: 3   fenofibrate (TRICOR) 145 MG tablet, TAKE 1 TABLET BY MOUTH  DAILY, Disp: 90 tablet, Rfl: 3   fluticasone (FLONASE) 50 MCG/ACT nasal spray, USE 2 SPRAYS IN BOTH NOSTRILS   DAILY, Disp: 64 g, Rfl: 0   glucose blood (ACCU-CHEK GUIDE) test strip, Use as instructed, Disp: 100 each, Rfl: 12   Insulin Lispro Prot & Lispro (HUMALOG MIX 75/25 KWIKPEN) (75-25) 100 UNIT/ML Kwikpen, Inject 20 Units into the skin in the morning and at bedtime., Disp: 45 mL, Rfl: 2   Insulin Pen Needle (PEN NEEDLES 31GX5/16") 31G X 8 MM MISC, Use twice a day for insulin injection, E11.9, Disp: 100 each, Rfl: 11   meloxicam (MOBIC) 15 MG tablet, Take 1 tablet (15 mg total) by mouth daily., Disp: 90 tablet, Rfl: 3   metFORMIN (GLUCOPHAGE) 1000 MG tablet, TAKE 1 TABLET BY MOUTH TWICE  DAILY WITH A MEAL, Disp: 200 tablet, Rfl: 2   metoprolol succinate (TOPROL-XL) 100 MG 24 hr tablet, TAKE 1 TABLET BY MOUTH DAILY  IMMEDIATELY FOLLOWING A MEAL, Disp: 100 tablet, Rfl: 2   OVER THE COUNTER MEDICATION, 1 tablet in the morning and at bedtime. [facility] - Restore multivitamin, Disp: , Rfl:    sildenafil (REVATIO) 20 MG tablet, Take 1 tablet (20 mg total) by mouth daily as needed. DO NOT USE with nitrates, Disp: 10 tablet, Rfl: 0   spironolactone (ALDACTONE) 25 MG tablet, TAKE 1/2 TABLET BY MOUTH EVERY DAY, Disp: 45 tablet, Rfl: 1   Study - ESSENCE - olezarsen 50 mg, 80 mg or placebo SQ injection (PI-Hilty), Inject 80 mg into the skin every 30 (thirty) days.  For Investigational Use Only. Injection subcutaneously in protocol approved injection sites (abdomen, thigh or outer area of upper arm) every 4 weeks. Please contact [facility] for any questions or concerns regarding this medication., Disp: , Rfl:    tamsulosin (FLOMAX) 0.4 MG CAPS capsule, TAKE 1 CAPSULE BY MOUTH  DAILY, Disp: 100 capsule, Rfl: 2   metoprolol tartrate (LOPRESSOR) 100 MG tablet, Take 1 tablet (100 mg total) by mouth once for 1 dose. Please take one time dose 112m metoprolol tartrate 2 hr prior to cardiac CT for HR control IF HR >55bpm., Disp: 1 tablet, Rfl: 0  Current Facility-Administered Medications:     Study - ESSENCE - olezarsen 50 mg, 80 mg or placebo SQ injection (PI-Hilty), 80 mg, Subcutaneous, Q28 days, Hilty, KNadean Corwin MD

## 2022-06-10 NOTE — Progress Notes (Signed)
Patient seen for mid point follow up and exam.  Doing well.  Has a "pinched" nerve in left hip, otherwise doing well.  No cardiac symptoms.  No obvious IP side effects.   Exam:  Alert, oriented male with [place] hat on. BP 134/73  P61  R16 unlabored T97.2 SaO2 97% No JVD.  No obvious carotid bruits. Lungs clear Cor  Normal S1 and S2.  Prominent S4 gallop.  Abdomen Soft Extremity  - no edema.   Neuro - gross CNS intact.   Imp  Essence week 25 Follow up stable Hypertriglyceridemia CAD SP PCI  Prior ICD SP MV repair   Plan  Continue follow up in ESSENCE trial Week 25 labs.   Loretha Brasil. Lia Foyer, MD, [place] Director, [facility]

## 2022-06-15 DIAGNOSIS — H269 Unspecified cataract: Secondary | ICD-10-CM | POA: Diagnosis not present

## 2022-06-15 DIAGNOSIS — H2512 Age-related nuclear cataract, left eye: Secondary | ICD-10-CM | POA: Diagnosis not present

## 2022-06-16 ENCOUNTER — Other Ambulatory Visit: Payer: Self-pay | Admitting: Internal Medicine

## 2022-06-25 ENCOUNTER — Encounter: Payer: Self-pay | Admitting: Internal Medicine

## 2022-06-25 ENCOUNTER — Ambulatory Visit: Payer: Medicare Other | Attending: Internal Medicine | Admitting: Internal Medicine

## 2022-06-25 VITALS — BP 106/51 | HR 74 | Ht 70.0 in | Wt 240.0 lb

## 2022-06-25 DIAGNOSIS — Z8673 Personal history of transient ischemic attack (TIA), and cerebral infarction without residual deficits: Secondary | ICD-10-CM

## 2022-06-25 DIAGNOSIS — Z006 Encounter for examination for normal comparison and control in clinical research program: Secondary | ICD-10-CM | POA: Diagnosis not present

## 2022-06-25 DIAGNOSIS — Z95810 Presence of automatic (implantable) cardiac defibrillator: Secondary | ICD-10-CM | POA: Diagnosis not present

## 2022-06-25 DIAGNOSIS — I5022 Chronic systolic (congestive) heart failure: Secondary | ICD-10-CM | POA: Diagnosis not present

## 2022-06-25 DIAGNOSIS — E785 Hyperlipidemia, unspecified: Secondary | ICD-10-CM

## 2022-06-25 NOTE — Patient Instructions (Signed)
Medication Instructions:  NO CHANGES  *If you need a refill on your cardiac medications before your next appointment, please call your pharmacy*   Follow-Up: At [facility], you and your health needs are our priority.  As part of our continuing mission to provide you with exceptional heart care, we have created designated Provider Care Teams.  These Care Teams include your primary Cardiologist (physician) and Advanced Practice Providers (APPs -  Physician Assistants and Nurse Practitioners) who all work together to provide you with the care you need, when you need it.  We recommend signing up for the patient portal called "MyChart".  Sign up information is provided on this After Visit Summary.  MyChart is used to connect with patients for Virtual Visits (Telemedicine).  Patients are able to view lab/test results, encounter notes, upcoming appointments, etc.  Non-urgent messages can be sent to your provider as well.   To learn more about what you can do with MyChart, go to [email or web address].    Your next appointment:    12 months with Dr. Hilty  

## 2022-06-25 NOTE — Progress Notes (Signed)
OFFICE NOTE  Chief Complaint:  Follow-up  Primary Care Physician: Hoyt Koch, MD  HPI:  ALIJA RIANO is a 71 y.o. 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  Subsequently he had cardiac arrest and had a single-chamber ICD placed in 2018 due to nonischemic cardiomyopathy.  He has had a history of atrial fibrillation, hypertension dyslipidemia and sleep apnea.  He is followed by Dr. Rayann Heman for his ICD.  At one point his LVEF had improved up to 55 to 60% on medical therapy however this past summer he had a pontine stroke associated diplopia.  Repeat echocardiogram showed a newly reduced EF 30 to 35% with anterior apical and inferoapical wall motion abnormality suggestive of Takatsubo versus LAD territory ischemia.  Given recent stroke ischemic evaluation was not performed.  He had not been complaining of chest pain.  Subsequently followed up with Almyra Deforest, PA-C, who has been increasing his heart failure medicine regimen, titrating his Entresto up to the highest dose and adding Farxiga in addition to spironolactone, beta-blocker and other heart failure therapies.  Repeat echocardiogram was performed in November which showed improvement in LVEF up to 40 to 45% however his EF is not normalized.  He denies any anginal symptoms but does get some mild shortness of breath with exertion.  12/29/2021  Mr. Gilliand returns today for follow-up.  Please report his LVEF has improved further slightly up to 45 to 50%.  The mitral valve gradients suggest some mild narrowing of the mitral valve annuloplasty without severe stenosis.  He did enroll in a clinical research trial for elevated triglycerides.  He was given a coronary CT scan as part of that study which did result in being abnormal suggesting a positive FFR and possible occlusion proximal to the known  RCA stent.  Despite this finding he is completely asymptomatic.  He denies chest pain or worsening shortness of breath.  Since this study was not performed for symptoms of angina, I am less inclined to need to follow this up.  He is also asking today about Viagra.  06/25/2022  Mr. Henner is seen today in follow-up.  He is maintained in the clinical research trial to lower triglycerides.  He seems to have had no issues with that.  Continues to have remote defibrillator checks which show no issues.  In June showed stable LVEF at 45 to 50% with a stable mitral valve gradient.  He did have a CT with FFR at that time as part of his clinical research trial.  This suggested some obstructive CAD proximal to the RCA stent however he has been asymptomatic.  Since this was only performed as part of the clinical research trial and he was asymptomatic we did not pursue any therapy.  It was felt that the FFR may be spurious because of the stent.  He reports he still does not have any anginal symptoms.  PMHx:  Past Medical History:  Diagnosis Date   Arthritis    Atrial fibrillation ([place])    post op, intol of anticoag   Cardiac arrest [facility])    a. s/p MDT single chamber ICD; 11-10-18- pt denies having a heart attack   Cataract    CHF (congestive heart failure) ([place])    Chronic kidney disease    kidney stone   Coronary artery disease    a. 2/7  Cath: LM nl, LAD min irregs, LCX min irregs, RI 40, RCA 79m EF 55-60% basal to mid inf HK, 3-4+ MR;  b. 08/25/2012 PCI of RCA with 4.0x15 Vision BMS   Diabetes mellitus without complication (HCC)    GERD (gastroesophageal reflux disease)    hx   Heart murmur    Hyperlipidemia    on statin   Hypertension    Myocardial infarction (HCC)    PONV (postoperative nausea and vomiting)    S/P mitral valve repair 09/27/2012   Complex valvuloplasty including triangular resection of flail posterior leaflet with 30 mm Sorin Memo 3D ring annuloplasty via right mini thoracotomy  approach   seasonal allergies 09/27/2008   Severe mitral regurgitation    a. Mitral valve prolapse with flail segment of posterior leaflet and severe MR by TEE, remote h/o bacterial endocarditis    Sleep apnea    NPSG 01/21/06- AHI 40.7/hr cpap   Stroke ([facility]    summer 2022- one messed up vision, the other balance   Subacute bacterial endocarditis 03/22/2008   Strep viridans   [place] 11/07/2019   appropriate shock (36J) for VF delivered    Past Surgical History:  Procedure Laterality Date   AMPUTATION  07/28/2012   Procedure: AMPUTATION DIGIT;  Surgeon: PColin Rhein MD;  Location: [facility];  Service: Orthopedics;  Laterality: Right;  2nd toe   CARDIAC CATHETERIZATION     COLONOSCOPY     EP IMPLANTABLE DEVICE N/A 05/01/2016   Procedure: Loop Recorder Removal;  Surgeon: JThompson Grayer MD;  [place];  Service: Cardiovascular;  Laterality: N/A;   FOOT SURGERY     BILATERAL TOES   ICD IMPLANT N/A 05/07/2017    Medtronic Visia AF MRI VR SureScan implanted by Dr CCurt Bearsfollowing VF arrest   Implantable loop recorder placement  03/31/2013   MDT Linq implanted by Dr ARayann Hemanto evaluate for further afib   INTRAOPERATIVE TRANSESOPHAGEAL ECHOCARDIOGRAM N/A 09/27/2012   Procedure: INTRAOPERATIVE TRANSESOPHAGEAL ECHOCARDIOGRAM;  Surgeon: CRexene Alberts MD;  [place]  Service: Open Heart Surgery;  Laterality: N/A;   LEFT AND RIGHT HEART CATHETERIZATION WITH CORONARY ANGIOGRAM N/A 08/12/2012   Procedure: LEFT AND RIGHT HEART CATHETERIZATION WITH CORONARY ANGIOGRAM;  Surgeon: DLarey Dresser MD;  [facility];  Service: Cardiovascular;  Laterality: N/A;   LEFT HEART CATH AND CORONARY ANGIOGRAPHY N/A 05/06/2017   Procedure: LEFT HEART CATH AND CORONARY ANGIOGRAPHY;  Surgeon: HLeonie Man MD;  [facility];  Service: Cardiovascular;  Laterality: N/A;   LOOP RECORDER IMPLANT N/A 03/31/2013   Procedure: LOOP RECORDER  IMPLANT;  Surgeon: JCoralyn Mark MD;  [place] [facility];  Service: Cardiovascular;  Laterality: N/A;   MITRAL VALVE REPAIR Right 09/27/2012   Procedure: MINIMALLY INVASIVE MITRAL VALVE REPAIR (MVR);  Surgeon: CRexene Alberts MD;  [place]  Service: Open Heart Surgery;  Laterality: Right;  Ultrasound guided   PERCUTANEOUS CORONARY STENT INTERVENTION (PCI-S) N/A 08/25/2012   Procedure: PERCUTANEOUS CORONARY STENT INTERVENTION (PCI-S);  Surgeon: MSherren Mocha MD;  [facility];  Service: Cardiovascular;  Laterality: N/A;   POLYPECTOMY     SEPTOPLASTY     Dr. CErnesto Rutherford  TEE WITHOUT CARDIOVERSION N/A 08/12/2012   Procedure: TRANSESOPHAGEAL ECHOCARDIOGRAM (TEE);  Surgeon: DLarey Dresser MD;  [place]  Service: Cardiovascular;  Laterality: N/A;   TONSILLECTOMY     UVULOPALATOPHARYNGOPLASTY      FAMHx:  Family History  Problem Relation Age of Onset   Diabetes Mother    Stroke Mother    Heart disease Father    Colon cancer Father 50   Diabetes Father    Renal cancer Father    Sudden death Brother    Heart attack Brother    Esophageal cancer Neg Hx    Rectal cancer Neg Hx    Stomach cancer Neg Hx    Colon polyps Neg Hx     SOCHx:   reports that he quit smoking about 44 years ago. His smoking use included cigarettes. He has a 12.50 pack-year smoking history. He has never used smokeless tobacco. He reports current alcohol use. He reports that he does not use drugs.  ALLERGIES:  Allergies  Allergen Reactions   Codeine Other (See Comments)    Causes bad constipation   Dust Mite Extract Cough   Pollen Extract Cough    ROS: Pertinent items noted in HPI and remainder of comprehensive ROS otherwise negative.  HOME MEDS: Current Outpatient Medications on File Prior to Visit  Medication Sig Dispense Refill   Accu-Chek Softclix Lancets lancets Use as instructed 100 each 12   acetaminophen (TYLENOL) 500 MG tablet Take 500 mg by mouth every 6  (six) hours as needed for mild pain or headache.      atorvastatin (LIPITOR) 40 MG tablet TAKE 1 TABLET BY MOUTH  DAILY 100 tablet 2   blood glucose meter kit and supplies KIT Dispense based on patient and insurance preference. Use up to four times daily as directed. 1 each 0   cetirizine (ZYRTEC) 10 MG tablet Take 10 mg by mouth daily.     cholecalciferol (VITAMIN D) 25 MCG (1000 UNIT) tablet Take 1 tablet by mouth daily.      clopidogrel (PLAVIX) 75 MG tablet TAKE 1 TABLET BY MOUTH  DAILY 100 tablet 2   ENTRESTO 97-103 MG TAKE 1 TABLET BY MOUTH TWICE A DAY 180 tablet 3   FARXIGA 10 MG TABS tablet TAKE 1 TABLET BY MOUTH DAILY BEFORE BREAKFAST. 90 tablet 3   fenofibrate (TRICOR) 145 MG tablet TAKE 1 TABLET BY MOUTH  DAILY 90 tablet 3   fluticasone (FLONASE) 50 MCG/ACT nasal spray USE 2 SPRAYS IN BOTH NOSTRILS  DAILY 64 g 0   glucose blood (ACCU-CHEK GUIDE) test strip Use as instructed 100 each 12   Insulin Lispro Prot & Lispro (HUMALOG MIX 75/25 KWIKPEN) (75-25) 100 UNIT/ML Kwikpen Inject 20 Units into the skin in the morning and at bedtime. 45 mL 2   Insulin Pen Needle (PEN NEEDLES 31GX5/16") 31G X 8 MM MISC Use twice a day for insulin injection, E11.9 100 each 11   meloxicam (MOBIC) 15 MG tablet Take 1 tablet (15 mg total) by mouth daily. 90 tablet 3   metFORMIN (GLUCOPHAGE) 1000 MG tablet TAKE 1 TABLET BY MOUTH TWICE  DAILY WITH A MEAL 200 tablet 2   metoprolol succinate (TOPROL-XL) 100 MG 24 hr tablet TAKE 1 TABLET BY MOUTH DAILY  IMMEDIATELY FOLLOWING A MEAL 100 tablet 2   OVER THE COUNTER MEDICATION 1 tablet in the morning and at bedtime. [facility] - Restore multivitamin     sildenafil (REVATIO) 20 MG tablet Take 1 tablet (20 mg total) by mouth daily as needed. DO NOT USE with nitrates 10 tablet 0   spironolactone (ALDACTONE) 25 MG tablet TAKE ONE-HALF TABLET BY MOUTH  DAILY 50 tablet 2   Study - ESSENCE - olezarsen 50 mg, 80 mg or placebo SQ  injection (PI-[place] Pierron) Inject 80 mg into the skin  every 30 (thirty) days. For Investigational Use Only. Injection subcutaneously in protocol approved injection sites (abdomen, thigh or outer area of upper arm) every 4 weeks. Please contact [facility] for any questions or concerns regarding this medication.     tamsulosin (FLOMAX) 0.4 MG CAPS capsule TAKE 1 CAPSULE BY MOUTH  DAILY 100 capsule 2   metoprolol tartrate (LOPRESSOR) 100 MG tablet Take 1 tablet (100 mg total) by mouth once for 1 dose. Please take one time dose 1106m metoprolol tartrate 2 hr prior to cardiac CT for HR control IF HR >55bpm. 1 tablet 0   Current Facility-Administered Medications on File Prior to Visit  Medication Dose Route Frequency Provider Last Rate Last Admin   Study - ESSENCE - olezarsen 50 mg, 80 mg or placebo SQ injection (PI-Philemon Riedesel)  80 mg Subcutaneous Q28 days HPixie Casino MD   80 mg at 06/10/22 1224    LABS/IMAGING: No results found for this or any previous visit (from the past 48 hour(s)). No results found.  LIPID PANEL:    Component Value Date/Time   CHOL 120 12/23/2021 1502   TRIG 122 12/23/2021 1502   HDL 38 (L) 12/23/2021 1502   CHOLHDL 3.2 12/23/2021 1502   CHOLHDL 3 11/18/2021 1324   VLDL 33.0 11/18/2021 1324   LDLCALC 60 12/23/2021 1502   LDLDIRECT 52.0 07/18/2019 1454     WEIGHTS: Wt Readings from Last 3 Encounters:  06/25/22 240 lb (108.9 kg)  05/22/22 236 lb (107 kg)  02/04/22 231 lb (104.8 kg)    VITALS: BP (!) 106/51   Pulse 74   Ht _0  (1.778 m)   Wt 240 lb (108.9 kg)   SpO2 98%   BMI 34.44 kg/m   EXAM: General appearance: alert and no distress Neck: no carotid bruit, no JVD, and thyroid not enlarged, symmetric, no tenderness/mass/nodules Lungs: clear to auscultation bilaterally Heart: regular rate and rhythm, S1, S2 normal, no murmur, click, rub or gallop Abdomen: soft, non-tender; bowel sounds normal; no masses,  no organomegaly Extremities: extremities normal, atraumatic, no  cyanosis or edema Pulses: 2+ and symmetric Skin: Skin color, texture, turgor normal. No rashes or lesions Neurologic: Grossly normal Psych: Pleasant  EKG: Deferred  ASSESSMENT: CAD status post PCI to the RCA in 2014 Coronary CTA suggested possible obstruction proximal to the right coronary artery stent (12/2021)-asymptomatic Cardiac arrest in 2018 due to nonischemic cardiomyopathy status post AICD History of subacute bacterial endocarditis status post mitral valve repair History of stroke in 02/2021 Hypertension Dyslipidemia OSA on CPAP Erectile dysfunction  PLAN: 1.   Mr. MPelaezseems to be doing fairly well.  His EF is been stable at about 45 to 50%.  There was a question on CT performed for research about possible obstruction in the RCA however he has had no symptoms.  He is enrolled in a clinical research trial which she is tolerating well.  Blood pressure is well-controlled.  We are blinded to his lipids.  No changes to his medicines today.  Plan follow-up with me annually or sooner as necessary.  KPixie Casino MD, FBaraga County Memorial Hospital FMantuaDirector of the Advanced Lipid Disorders &  Cardiovascular Risk Reduction Clinic Diplomate of the American Board of Clinical Lipidology Attending Cardiologist  Direct Dial: [phone] Fax: [phone] Website:  [email or web address] Plater12/21/2023, 3:40 PM

## 2022-06-26 DIAGNOSIS — H269 Unspecified cataract: Secondary | ICD-10-CM | POA: Diagnosis not present

## 2022-06-26 DIAGNOSIS — H25811 Combined forms of age-related cataract, right eye: Secondary | ICD-10-CM | POA: Diagnosis not present

## 2022-06-26 DIAGNOSIS — H2511 Age-related nuclear cataract, right eye: Secondary | ICD-10-CM | POA: Diagnosis not present

## 2022-06-30 NOTE — Progress Notes (Signed)
Remote ICD transmission.   

## 2022-07-07 ENCOUNTER — Encounter: Payer: Self-pay | Admitting: *Deleted

## 2022-07-07 DIAGNOSIS — Z006 Encounter for examination for normal comparison and control in clinical research program: Secondary | ICD-10-CM

## 2022-07-07 NOTE — Research (Signed)
Spoke with Christopher [name] to remind him of his appointment  with research tomorrow at 1130, gave him the parking code, and instructed he could eat breakfast. Voices understanding.

## 2022-07-08 ENCOUNTER — Encounter: Payer: Medicare Other | Admitting: *Deleted

## 2022-07-08 ENCOUNTER — Encounter: Payer: Self-pay | Admitting: *Deleted

## 2022-07-08 MED ORDER — STUDY - ESSENCE - OLEZARSEN 50 MG, 80 MG OR PLACEBO SQ INJECTION (PI-HILTY)
80.0000 mg | INJECTION | SUBCUTANEOUS | Status: DC
  Administered 2022-07-08: 80 mg via SUBCUTANEOUS
  Filled 2022-07-08: qty 0.8

## 2022-07-08 NOTE — Research (Addendum)
TREATMENT DAY 197 - STUDY WEEK 29    Subject Number: [id number]             Randomization Number: [id number]            Date:08-Jul-2021      [x] Vital Signs Collected - Blood Pressure: 137/89 - Heart Rate:72  - Respiratory Rate:20 - Temperature:97.3 - Oxygen Saturation: 98%   [x]   (abdominal pain only) since last visit  [x]  Assessment of ER Visits, Hospitalizations, and Inpatient Days  [x]  Adverse Events and Concomitant Medications  [x]  Diet, Lifestyle, and Alcohol Counseling   [x]  Study Drug:  Injection    Christopher [name] is here for Week 29 Day 197 of [facility] research study. He reports no abd pain, no visits to the Ed or Urgent care since last visit. VS taken at 1143, Injection given at 1211 in left lower abd. Tol well. Change in Humalog mix noted changed form 20 units to 18 units. Next appointment scheduled for Jan 31 at 1130.   Current Outpatient Medications:    Accu-Chek Softclix Lancets lancets, Use as instructed, Disp: 100 each, Rfl: 12   acetaminophen (TYLENOL) 500 MG tablet, Take 500 mg by mouth every 6 (six) hours as needed for mild pain or headache. , Disp: , Rfl:    atorvastatin (LIPITOR) 40 MG tablet, TAKE 1 TABLET BY MOUTH  DAILY, Disp: 100 tablet, Rfl: 2   blood glucose meter kit and supplies KIT, Dispense based on patient and insurance preference. Use up to four times daily as directed., Disp: 1 each, Rfl: 0   cetirizine (ZYRTEC) 10 MG tablet, Take 10 mg by mouth daily., Disp: , Rfl:    cholecalciferol (VITAMIN D) 25 MCG (1000 UNIT) tablet, Take 1 tablet by mouth daily. , Disp: , Rfl:    clopidogrel (PLAVIX) 75 MG tablet, TAKE 1 TABLET BY MOUTH  DAILY, Disp: 100 tablet, Rfl: 2   ENTRESTO 97-103 MG, TAKE 1 TABLET BY MOUTH TWICE A DAY, Disp: 180 tablet, Rfl: 3   FARXIGA 10 MG TABS tablet, TAKE 1 TABLET BY MOUTH DAILY BEFORE BREAKFAST., Disp: 90 tablet, Rfl: 3   fenofibrate (TRICOR) 145 MG tablet, TAKE 1 TABLET BY MOUTH  DAILY, Disp: 90 tablet, Rfl: 3   fluticasone  (FLONASE) 50 MCG/ACT nasal spray, USE 2 SPRAYS IN BOTH NOSTRILS  DAILY, Disp: 64 g, Rfl: 0   glucose blood (ACCU-CHEK GUIDE) test strip, Use as instructed, Disp: 100 each, Rfl: 12   Insulin Lispro Prot & Lispro (HUMALOG MIX 75/25 KWIKPEN) (75-25) 100 UNIT/ML Kwikpen, Inject 20 Units into the skin in the morning and at bedtime., Disp: 45 mL, Rfl: 2   Insulin Pen Needle (PEN NEEDLES 31GX5/16") 31G X 8 MM MISC, Use twice a day for insulin injection, E11.9, Disp: 100 each, Rfl: 11   meloxicam (MOBIC) 15 MG tablet, Take 1 tablet (15 mg total) by mouth daily., Disp: 90 tablet, Rfl: 3   metFORMIN (GLUCOPHAGE) 1000 MG tablet, TAKE 1 TABLET BY MOUTH TWICE  DAILY WITH A MEAL, Disp: 200 tablet, Rfl: 2   metoprolol succinate (TOPROL-XL) 100 MG 24 hr tablet, TAKE 1 TABLET BY MOUTH DAILY  IMMEDIATELY FOLLOWING A MEAL, Disp: 100 tablet, Rfl: 2   OVER THE COUNTER MEDICATION, 1 tablet in the morning and at bedtime. [facility] - Restore multivitamin, Disp: , Rfl:    sildenafil (REVATIO) 20 MG tablet, Take 1 tablet (20 mg total) by mouth daily as needed. DO NOT USE with nitrates, Disp: 10 tablet,  Rfl: 0   spironolactone (ALDACTONE) 25 MG tablet, TAKE ONE-HALF TABLET BY MOUTH  DAILY, Disp: 50 tablet, Rfl: 2   Study - ESSENCE - olezarsen 50 mg, 80 mg or placebo SQ injection (PI-Hilty), Inject 80 mg into the skin every 30 (thirty) days. For Investigational Use Only. Injection subcutaneously in protocol approved injection sites (abdomen, thigh or outer area of upper arm) every 4 weeks. Please contact [facility] for any questions or concerns regarding this medication., Disp: , Rfl:    tamsulosin (FLOMAX) 0.4 MG CAPS capsule, TAKE 1 CAPSULE BY MOUTH  DAILY, Disp: 100 capsule, Rfl: 2   metoprolol tartrate (LOPRESSOR) 100 MG tablet, Take 1 tablet (100 mg total) by mouth once for 1 dose. Please take one time dose 100mg metoprolol tartrate 2 hr prior to cardiac CT for HR control IF HR >55bpm.,  Disp: 1 tablet, Rfl: 0  Current Facility-Administered Medications:    Study - ESSENCE - olezarsen 50 mg, 80 mg or placebo SQ injection (PI-Hilty), 80 mg, Subcutaneous, Q28 days, Hilty, Kenneth C, MD, 80 mg at 07/08/22 1211  

## 2022-07-13 ENCOUNTER — Ambulatory Visit (INDEPENDENT_AMBULATORY_CARE_PROVIDER_SITE_OTHER): Payer: Medicare Other

## 2022-07-13 DIAGNOSIS — I5022 Chronic systolic (congestive) heart failure: Secondary | ICD-10-CM

## 2022-07-13 DIAGNOSIS — Z95810 Presence of automatic (implantable) cardiac defibrillator: Secondary | ICD-10-CM | POA: Diagnosis not present

## 2022-07-17 ENCOUNTER — Other Ambulatory Visit: Payer: Self-pay | Admitting: Internal Medicine

## 2022-07-20 NOTE — Progress Notes (Signed)
EPIC Encounter for ICM Monitoring  Patient Name: Christopher [name] is a 72 y.o. male Date: 07/20/2022 Primary Care Physican: Hoyt Koch, MD Primary Cardiologist: [facility] Electrophysiologist: Curt Bears 10/30/2021 Weight: 238 lbs (not weighing at home)    01/07/2022 Weight: 234 lbs       02/11/2022 Weight: 234 lbs           03/18/2022 Weight: 234-235 lbs     05/27/2022 Weight: 234 lbs     Transmission results reviewed.      Optivol thoracic impedance suggesting normal fluid levels.     Spironolactone 25 mg take 0.5 tablet (12.5 total) daily   Labs: 12/23/2021 Creatinine 1.19, BUN 17, Potassium 4.0, Sodium 142, GFR 65 11/18/2021 Creatinine 1.33, BUN 21, Potassium 3.8, Sodium 138 A complete set of results can be found in Results Review.   Recommendations:  No changes.   Follow-up plan: ICM clinic phone appointment on 08/18/2022.   91 day device clinic remote transmission 08/17/2022.      EP/Cardiology Office Visits:  Recall 06/20/2023 with Dr Debara Pickett.  Recall 06/15/2022 with Oda Kilts.     Copy of ICM check sent to Dr. Curt Bears.    3 month ICM trend: 07/13/2022.    12-14 Month ICM trend:     Rosalene Billings, RN 07/20/2022 10:43 AM

## 2022-08-04 ENCOUNTER — Encounter: Payer: Self-pay | Admitting: *Deleted

## 2022-08-04 DIAGNOSIS — Z006 Encounter for examination for normal comparison and control in clinical research program: Secondary | ICD-10-CM

## 2022-08-04 NOTE — Research (Signed)
Message left to remind Christopher [name] of his appointment tomorrow at 1130. No need to be NPO and gave him the parking code.

## 2022-08-05 ENCOUNTER — Encounter: Payer: Medicare Other | Admitting: *Deleted

## 2022-08-05 ENCOUNTER — Other Ambulatory Visit: Payer: Self-pay

## 2022-08-05 DIAGNOSIS — Z006 Encounter for examination for normal comparison and control in clinical research program: Secondary | ICD-10-CM

## 2022-08-05 MED ORDER — STUDY - ESSENCE - OLEZARSEN 50 MG, 80 MG OR PLACEBO SQ INJECTION (PI-HILTY)
80.0000 mg | INJECTION | SUBCUTANEOUS | Status: DC
  Administered 2022-08-05: 80 mg via SUBCUTANEOUS
  Filled 2022-08-05: qty 0.8

## 2022-08-05 NOTE — Research (Addendum)
TREATMENT DAY 225 - STUDY WEEK 33    Subject Number: [id number]            Randomization [id number]             Date: 05-Aug-2022         '[x]'$   (abdominal pain only) since last visit  '[x]'$  Assessment of ER Visits, Hospitalizations, and Inpatient Days  '[x]'$  Adverse Events and Concomitant Medications  '[x]'$  Diet, Lifestyle, and Alcohol Counseling   '[x]'$  Study Drug: [place] Injection   Mr Payne is here for Week 33 day 225 of [facility] research study. He reports no abd pain, and no visits to the ED or Urgent care since last seen. No changes in his medications . Injection was given in right lowr abd at 1153. Tol well. Scheduled next appointment for Feb 28 at 1130.   Current Outpatient Medications:    Accu-Chek Softclix Lancets lancets, Use as instructed, Disp: 100 each, Rfl: 12   acetaminophen (TYLENOL) 500 MG tablet, Take 500 mg by mouth every 6 (six) hours as needed for mild pain or headache. , Disp: , Rfl:    atorvastatin (LIPITOR) 40 MG tablet, TAKE 1 TABLET BY MOUTH  DAILY, Disp: 100 tablet, Rfl: 2   blood glucose meter kit and supplies KIT, Dispense based on patient and insurance preference. Use up to four times daily as directed., Disp: 1 each, Rfl: 0   cetirizine (ZYRTEC) 10 MG tablet, Take 10 mg by mouth daily., Disp: , Rfl:    cholecalciferol (VITAMIN D) 25 MCG (1000 UNIT) tablet, Take 1 tablet by mouth daily. , Disp: , Rfl:    clopidogrel (PLAVIX) 75 MG tablet, TAKE 1 TABLET BY MOUTH DAILY, Disp: 100 tablet, Rfl: 2   ENTRESTO 97-103 MG, TAKE 1 TABLET BY MOUTH TWICE A DAY, Disp: 180 tablet, Rfl: 3   FARXIGA 10 MG TABS tablet, TAKE 1 TABLET BY MOUTH DAILY BEFORE BREAKFAST., Disp: 90 tablet, Rfl: 3   fenofibrate (TRICOR) 145 MG tablet, TAKE 1 TABLET BY MOUTH  DAILY, Disp: 90 tablet, Rfl: 3   fluticasone (FLONASE) 50 MCG/ACT nasal spray, USE 2 SPRAYS IN BOTH NOSTRILS  DAILY, Disp: 64 g, Rfl: 0   glucose blood (ACCU-CHEK GUIDE) test strip, Use as instructed, Disp: 100 each, Rfl: 12    Insulin Lispro Prot & Lispro (HUMALOG MIX 75/25 KWIKPEN) (75-25) 100 UNIT/ML Kwikpen, Inject 20 Units into the skin in the morning and at bedtime., Disp: 45 mL, Rfl: 2   Insulin Pen Needle (PEN NEEDLES 31GX5/16") 31G X 8 MM MISC, Use twice a day for insulin injection, E11.9, Disp: 100 each, Rfl: 11   meloxicam (MOBIC) 15 MG tablet, Take 1 tablet (15 mg total) by mouth daily., Disp: 90 tablet, Rfl: 3   metFORMIN (GLUCOPHAGE) 1000 MG tablet, TAKE 1 TABLET BY MOUTH TWICE  DAILY WITH A MEAL, Disp: 200 tablet, Rfl: 2   metoprolol succinate (TOPROL-XL) 100 MG 24 hr tablet, TAKE 1 TABLET BY MOUTH DAILY  IMMEDIATELY FOLLOWING A MEAL, Disp: 100 tablet, Rfl: 2   OVER THE COUNTER MEDICATION, 1 tablet in the morning and at bedtime. [facility] - Restore multivitamin, Disp: , Rfl:    sildenafil (REVATIO) 20 MG tablet, Take 1 tablet (20 mg total) by mouth daily as needed. DO NOT USE with nitrates, Disp: 10 tablet, Rfl: 0   spironolactone (ALDACTONE) 25 MG tablet, TAKE ONE-HALF TABLET BY MOUTH  DAILY, Disp: 50 tablet, Rfl: 2   Study - ESSENCE - olezarsen 50  mg, 80 mg or placebo SQ injection (PI-Hilty), Inject 80 mg into the skin every 30 (thirty) days. For Investigational Use Only. Injection subcutaneously in protocol approved injection sites (abdomen, thigh or outer area of upper arm) every 4 weeks. Please contact [facility] for any questions or concerns regarding this medication., Disp: , Rfl:    tamsulosin (FLOMAX) 0.4 MG CAPS capsule, TAKE 1 CAPSULE BY MOUTH  DAILY, Disp: 100 capsule, Rfl: 2   metoprolol tartrate (LOPRESSOR) 100 MG tablet, Take 1 tablet (100 mg total) by mouth once for 1 dose. Please take one time dose '100mg'$  metoprolol tartrate 2 hr prior to cardiac CT for HR control IF HR >55bpm., Disp: 1 tablet, Rfl: 0  Current Facility-Administered Medications:    Study - ESSENCE - olezarsen 50 mg, 80 mg or placebo SQ injection (PI-Hilty), 80 mg, Subcutaneous, Q28 days, Pixie Casino, MD, 80 mg at 08/05/22 1153

## 2022-08-17 ENCOUNTER — Ambulatory Visit: Payer: Medicare Other

## 2022-08-17 DIAGNOSIS — I428 Other cardiomyopathies: Secondary | ICD-10-CM

## 2022-08-17 DIAGNOSIS — I5022 Chronic systolic (congestive) heart failure: Secondary | ICD-10-CM

## 2022-08-18 ENCOUNTER — Ambulatory Visit: Payer: Medicare Other

## 2022-08-18 DIAGNOSIS — Z95810 Presence of automatic (implantable) cardiac defibrillator: Secondary | ICD-10-CM

## 2022-08-18 DIAGNOSIS — I5022 Chronic systolic (congestive) heart failure: Secondary | ICD-10-CM | POA: Diagnosis not present

## 2022-08-18 LAB — CUP PACEART REMOTE DEVICE CHECK
Battery Remaining Longevity: 69 mo
Battery Voltage: 2.95 V
Brady Statistic RV Percent Paced: 0.25 %
Date Time Interrogation Session: 20240213142828
HighPow Impedance: 74 Ohm
Implantable Lead Connection Status: 753985
Implantable Lead Implant Date: 20181102
Implantable Lead Location: 753860
Implantable Pulse Generator Implant Date: 20181102
Lead Channel Impedance Value: 418 Ohm
Lead Channel Impedance Value: 551 Ohm
Lead Channel Pacing Threshold Amplitude: 0.5 V
Lead Channel Pacing Threshold Pulse Width: 0.4 ms
Lead Channel Sensing Intrinsic Amplitude: 5.75 mV
Lead Channel Sensing Intrinsic Amplitude: 5.75 mV
Lead Channel Setting Pacing Amplitude: 2 V
Lead Channel Setting Pacing Pulse Width: 0.4 ms
Lead Channel Setting Sensing Sensitivity: 0.3 mV
Zone Setting Status: 755011
Zone Setting Status: 755011

## 2022-08-24 NOTE — Progress Notes (Signed)
EPIC Encounter for ICM Monitoring  Patient Name: Christopher [name] is a 72 y.o. male Date: 08/24/2022 Primary Care Physican: Hoyt Koch, MD Primary Cardiologist: [facility] Electrophysiologist: Curt Bears 10/30/2021 Weight: 238 lbs (not weighing at home)    01/07/2022 Weight: 234 lbs       02/11/2022 Weight: 234 lbs           03/18/2022 Weight: 234-235 lbs     05/27/2022 Weight: 234 lbs     Transmission results reviewed.      Optivol thoracic impedance suggesting normal fluid levels.     Spironolactone 25 mg take 0.5 tablet (12.5 total) daily   Labs: 12/23/2021 Creatinine 1.19, BUN 17, Potassium 4.0, Sodium 142, GFR 65 11/18/2021 Creatinine 1.33, BUN 21, Potassium 3.8, Sodium 138 A complete set of results can be found in Results Review.   Recommendations:  No changes.   Follow-up plan: ICM clinic phone appointment on 09/21/2022.   91 day device clinic remote transmission 11/16/2022.      EP/Cardiology Office Visits:  Recall 06/20/2023 with Dr Debara Pickett.  Recall 06/15/2022 with Oda Kilts.     Copy of ICM check sent to Dr. Curt Bears.    3 month ICM trend: 08/18/2022.    12-14 Month ICM trend:     Rosalene Billings, RN 08/24/2022 4:59 PM

## 2022-08-26 ENCOUNTER — Other Ambulatory Visit: Payer: Self-pay | Admitting: Internal Medicine

## 2022-09-01 ENCOUNTER — Encounter: Payer: Self-pay | Admitting: *Deleted

## 2022-09-01 DIAGNOSIS — Z006 Encounter for examination for normal comparison and control in clinical research program: Secondary | ICD-10-CM

## 2022-09-01 NOTE — Research (Addendum)
Message left for Christopher [name] to remind him of his appointment tomorrow at 1130, gave him the parking code, and reminded to be NPO.

## 2022-09-02 ENCOUNTER — Other Ambulatory Visit: Payer: Self-pay

## 2022-09-02 ENCOUNTER — Encounter: Payer: Medicare Other | Admitting: *Deleted

## 2022-09-02 DIAGNOSIS — Z006 Encounter for examination for normal comparison and control in clinical research program: Secondary | ICD-10-CM

## 2022-09-02 MED ORDER — STUDY - ESSENCE - OLEZARSEN 50 MG, 80 MG OR PLACEBO SQ INJECTION (PI-HILTY)
80.0000 mg | INJECTION | SUBCUTANEOUS | Status: DC
  Filled 2022-09-02: qty 0.8

## 2022-09-02 NOTE — Research (Addendum)
Christopher [name]  [facility] Week 25 Day 253 02-Sep-2022               Chemistry: Uric Acid  3.5 mg/dL                    [] Clinically Significant  [x] Not Clinically Significant      Urinalysis: Glucose >1000     mg/dL                [] Clinically Significant  [x] Not Clinically Significant Specific Gravity 1.038                    [] Clinically Significant  [x] Not Clinically Significant Protein 20 mg/dL                            [] Clinically Significant  [x] Not Clinically Significant Urobilinogen 2 mg/dL                     [] Clinically Significant  [x] Not Clinically Significant Hyaline Cast Occ                            [] Clinically Significant  [x] Not Clinically Significant Calcium Oxalate Crystals Present  [] Clinically Significant  [x] Not Clinically Significant Mucus 2+                                        [] Clinically Significant  [x] Not Clinically Significant   Urine Chemistry:  Urine Albumin  3.28 mg/dL              [] Clinically Significant  [x] Not Clinically Significant Albumin Creatinine ratio 23 mg/g    [] Clinically Significant  [x] Not Clinically Significant     Any further action needed to be taken per the PI? No  Pixie Casino, MD, [facility], [place] Director of the [facility]  [facility] Diplomate of the American Board of Clinical Lipidology Attending Cardiologist  Direct Dial: [phone]  Fax: [phone]  Website:  [email or web address]       TREATMENT DAY 253 - STUDY WEEK 37    Subject Number: [id number]            Randomization Number: [id number]           Date: 02-Sep-2022      [x] Vital Signs Collected - Blood Pressure:121/77 - Heart Rate:69 - Respiratory Rate:18 - Temperature: 97.2 - Oxygen Saturation:97%   [x]  Extended Urinalysis   [x]  Lab collection per protocol  [x]   (abdominal pain only) since last visit  [x]  Assessment of ER Visits, Hospitalizations, and  Inpatient Days  [x]  Adverse Events and Concomitant Medications  [x]  Diet, Lifestyle, and Alcohol Counseling   [x]  Study Drug: [place] Injection   Christopher [name] is here for Week 31 Day 253 of [facility] research. He reports no abd pain, no visits to the Ed or Urgent care, and no medication changes since last seen.  VS taken at 1134, Blood drawn at 1151, and Urine obtained at 1154. Injection given at 1202 in left lower abd. Tol well. Scheduled next appointment for March 27 at 1130. Christopher [name] states he no longer takes Meloxicam. And takes 18 units of insulin 2 times daily.   Current Outpatient Medications:    Accu-Chek Softclix Lancets lancets, Use as instructed,  Disp: 100 each, Rfl: 12   acetaminophen (TYLENOL) 500 MG tablet, Take 500 mg by mouth every 6 (six) hours as needed for mild pain or headache. , Disp: , Rfl:    atorvastatin (LIPITOR) 40 MG tablet, Take 1 tablet (40 mg total) by mouth daily. Annual appt due in May must see provider for future refills, Disp: 100 tablet, Rfl: 0   blood glucose meter kit and supplies KIT, Dispense based on patient and insurance preference. Use up to four times daily as directed., Disp: 1 each, Rfl: 0   cetirizine (ZYRTEC) 10 MG tablet, Take 10 mg by mouth daily., Disp: , Rfl:    cholecalciferol (VITAMIN D) 25 MCG (1000 UNIT) tablet, Take 1 tablet by mouth daily. , Disp: , Rfl:    clopidogrel (PLAVIX) 75 MG tablet, TAKE 1 TABLET BY MOUTH DAILY, Disp: 100 tablet, Rfl: 2   ENTRESTO 97-103 MG, TAKE 1 TABLET BY MOUTH TWICE A DAY, Disp: 180 tablet, Rfl: 3   FARXIGA 10 MG TABS tablet, TAKE 1 TABLET BY MOUTH DAILY BEFORE BREAKFAST., Disp: 90 tablet, Rfl: 3   fenofibrate (TRICOR) 145 MG tablet, Take 1 tablet (145 mg total) by mouth daily. Annual appt due in May must see provider for future refills, Disp: 100 tablet, Rfl: 0   fluticasone (FLONASE) 50 MCG/ACT nasal spray, Place 2 sprays into both nostrils daily. Annual appt due in May must see provider for future refills, Disp: 64  g, Rfl: 0   glucose blood (ACCU-CHEK GUIDE) test strip, Use as instructed, Disp: 100 each, Rfl: 12   Insulin Lispro Prot & Lispro (HUMALOG MIX 75/25 KWIKPEN) (75-25) 100 UNIT/ML Kwikpen, Inject 20 units into skin in the morning and at bedtime, Disp: 45 mL, Rfl: 0   Insulin Pen Needle (PEN NEEDLES 31GX5/16") 31G X 8 MM MISC, Use twice a day for insulin injection, E11.9, Disp: 100 each, Rfl: 11   meloxicam (MOBIC) 15 MG tablet, Take 1 tablet (15 mg total) by mouth daily., Disp: 90 tablet, Rfl: 3   metFORMIN (GLUCOPHAGE) 1000 MG tablet, TAKE 1 TABLET BY MOUTH TWICE  DAILY WITH A MEAL, Disp: 200 tablet, Rfl: 2   metoprolol succinate (TOPROL-XL) 100 MG 24 hr tablet, TAKE 1 TABLET BY MOUTH DAILY  IMMEDIATELY FOLLOWING A MEAL, Disp: 100 tablet, Rfl: 2   OVER THE COUNTER MEDICATION, 1 tablet in the morning and at bedtime. [facility] - Restore multivitamin, Disp: , Rfl:    sildenafil (REVATIO) 20 MG tablet, Take 1 tablet (20 mg total) by mouth daily as needed. DO NOT USE with nitrates, Disp: 10 tablet, Rfl: 0   spironolactone (ALDACTONE) 25 MG tablet, TAKE ONE-HALF TABLET BY MOUTH  DAILY, Disp: 50 tablet, Rfl: 2   Study - [facility] - olezarsen 50 mg, 80 mg or placebo SQ injection (PI-Hilty), Inject 80 mg into the skin every 30 (thirty) days. For Investigational Use Only. Injection subcutaneously in protocol approved injection sites (abdomen, thigh or outer area of upper arm) every 4 weeks. Please contact [facility] for any questions or concerns regarding this medication., Disp: , Rfl:    tamsulosin (FLOMAX) 0.4 MG CAPS capsule, TAKE 1 CAPSULE BY MOUTH  DAILY, Disp: 100 capsule, Rfl: 2   metoprolol tartrate (LOPRESSOR) 100 MG tablet, Take 1 tablet (100 mg total) by mouth once for 1 dose. Please take one time dose 100mg  metoprolol tartrate 2 hr prior to cardiac CT for HR control IF HR >55bpm., Disp: 1 tablet, Rfl: 0  Current Facility-Administered Medications:  Study - ESSENCE -  olezarsen 50 mg, 80 mg or placebo SQ injection (PI-Hilty), 80 mg, Subcutaneous, Q28 days, Hilty, Nadean Corwin, MD

## 2022-09-07 NOTE — Research (Signed)
Christopher [name]  Screening Run in [facility] 26-Nov-2021

## 2022-09-21 ENCOUNTER — Ambulatory Visit: Payer: Medicare Other | Attending: Cardiology

## 2022-09-21 DIAGNOSIS — Z95810 Presence of automatic (implantable) cardiac defibrillator: Secondary | ICD-10-CM | POA: Diagnosis not present

## 2022-09-21 DIAGNOSIS — I5022 Chronic systolic (congestive) heart failure: Secondary | ICD-10-CM

## 2022-09-23 ENCOUNTER — Other Ambulatory Visit: Payer: Self-pay | Admitting: Internal Medicine

## 2022-09-25 NOTE — Progress Notes (Signed)
EPIC Encounter for ICM Monitoring  Patient Name: Christopher [name] is a 72 y.o. male Date: 09/25/2022 Primary Care Physican: Hoyt Koch, MD Primary Cardiologist: [facility] Electrophysiologist: Curt Bears 03/18/2022 Weight: 234-235 lbs     05/27/2022 Weight: 234 lbs   09/25/2022 Weight: 234 lbs   Spoke with patient and heart failure questions reviewed.  Transmission results reviewed.  Pt asymptomatic for fluid accumulation.  Reports feeling well at this time and voices no complaints.  Pt report drinking excessive amounts of fluid including about 6-10 cans of soda as well as a lot of water. Explained the importance of limiting fluid intake and how it effects the heart.    Optivol thoracic impedance suggesting normal fluid levels.     Spironolactone 25 mg take 0.5 tablet (12.5 total) daily   Labs: 12/23/2021 Creatinine 1.19, BUN 17, Potassium 4.0, Sodium 142, GFR 65 11/18/2021 Creatinine 1.33, BUN 21, Potassium 3.8, Sodium 138 A complete set of results can be found in Results Review.   Recommendations:  Recommendation to limit 64 oz daily.  Encouraged to call if experiencing any fluid symptoms.    Follow-up plan: ICM clinic phone appointment on 10/26/2022.   91 day device clinic remote transmission 11/16/2022.      EP/Cardiology Office Visits:  Recall 06/20/2023 with Dr Debara Pickett.  Recall 06/15/2022 with Oda Kilts.     Copy of ICM check sent to Dr. Curt Bears.  3 month ICM trend: 09/22/2022.    12-14 Month ICM trend:     Rosalene Billings, RN 09/25/2022 4:42 PM

## 2022-09-29 ENCOUNTER — Encounter: Payer: Self-pay | Admitting: *Deleted

## 2022-09-29 DIAGNOSIS — Z006 Encounter for examination for normal comparison and control in clinical research program: Secondary | ICD-10-CM

## 2022-09-29 NOTE — Research (Signed)
Message left for Christopher [name] to remind him of his appointment tomorrow at 1130 gave parking code, and no need to be NPO.

## 2022-09-30 ENCOUNTER — Other Ambulatory Visit: Payer: Self-pay

## 2022-09-30 ENCOUNTER — Encounter: Payer: Medicare Other | Admitting: *Deleted

## 2022-09-30 ENCOUNTER — Other Ambulatory Visit (HOSPITAL_COMMUNITY): Payer: Self-pay | Admitting: Emergency Medicine

## 2022-09-30 ENCOUNTER — Other Ambulatory Visit (HOSPITAL_COMMUNITY): Payer: Self-pay | Admitting: Internal Medicine

## 2022-09-30 DIAGNOSIS — Z006 Encounter for examination for normal comparison and control in clinical research program: Secondary | ICD-10-CM

## 2022-09-30 DIAGNOSIS — E785 Hyperlipidemia, unspecified: Secondary | ICD-10-CM

## 2022-09-30 MED ORDER — STUDY - ESSENCE - OLEZARSEN 50 MG, 80 MG OR PLACEBO SQ INJECTION (PI-HILTY)
80.0000 mg | INJECTION | SUBCUTANEOUS | Status: DC
  Administered 2022-09-30: 80 mg via SUBCUTANEOUS
  Filled 2022-09-30: qty 0.8

## 2022-09-30 NOTE — Research (Addendum)
TREATMENT DAY 281 - STUDY WEEK 41    Subject Number: [id number]            Randomization [id number]             Date:30-Sep-2022      [x] Vital Signs Collected - Blood Pressure:120/52 - Heart Rate:63 - Respiratory Rate:20 - Temperature:97.0 - Oxygen Saturation: 97%    [x]   (abdominal pain only) since last visit  [x]  Assessment of ER Visits, Hospitalizations, and Inpatient Days  [x]  Adverse Events and Concomitant Medications  [x]  Diet, Lifestyle, and Alcohol Counseling   [x]  Study Drug: [place] Injection   Christopher [name] is here for Week 41 day 281 of [facility] research study. He reports no abd pain and no visits to the Ed or urgent care since last seen. VS taken at 1136. Scheduled next visit for April 24 at 1130. Injection given in left lower abd at 1200 tol well. Kit number [id number]. He Does report a change in his insulin dose. He states he is now taking 18 units 2 times a day. He states he no longer is taking Meloxicam.     Current Outpatient Medications:    acetaminophen (TYLENOL) 500 MG tablet, Take 500 mg by mouth every 6 (six) hours as needed for mild pain or headache. , Disp: , Rfl:    atorvastatin (LIPITOR) 40 MG tablet, Take 1 tablet (40 mg total) by mouth daily. Annual appt due in May must see provider for future refills, Disp: 100 tablet, Rfl: 0   blood glucose meter kit and supplies KIT, Dispense based on patient and insurance preference. Use up to four times daily as directed., Disp: 1 each, Rfl: 0   cetirizine (ZYRTEC) 10 MG tablet, Take 10 mg by mouth daily., Disp: , Rfl:    cholecalciferol (VITAMIN D) 25 MCG (1000 UNIT) tablet, Take 1 tablet by mouth daily. , Disp: , Rfl:    clopidogrel (PLAVIX) 75 MG tablet, TAKE 1 TABLET BY MOUTH DAILY, Disp: 100 tablet, Rfl: 2   ENTRESTO 97-103 MG, TAKE 1 TABLET BY MOUTH TWICE A DAY, Disp: 180 tablet, Rfl: 3   FARXIGA 10 MG TABS tablet, TAKE 1 TABLET BY MOUTH DAILY BEFORE BREAKFAST., Disp: 90 tablet, Rfl: 3   fenofibrate (TRICOR) 145  MG tablet, Take 1 tablet (145 mg total) by mouth daily. Annual appt due in May must see provider for future refills, Disp: 100 tablet, Rfl: 0   fluticasone (FLONASE) 50 MCG/ACT nasal spray, USE 2 SPRAYS IN BOTH NOSTRILS  DAILY, Disp: 64 g, Rfl: 0   glucose blood (ACCU-CHEK GUIDE) test strip, Use as instructed, Disp: 100 each, Rfl: 12   Insulin Lispro Prot & Lispro (HUMALOG MIX 75/25 KWIKPEN) (75-25) 100 UNIT/ML Kwikpen, Inject 20 units into skin in the morning and at bedtime, Disp: 45 mL, Rfl: 0   Insulin Pen Needle (PEN NEEDLES 31GX5/16") 31G X 8 MM MISC, Use twice a day for insulin injection, E11.9, Disp: 100 each, Rfl: 11   metFORMIN (GLUCOPHAGE) 1000 MG tablet, TAKE 1 TABLET BY MOUTH TWICE  DAILY WITH A MEAL, Disp: 200 tablet, Rfl: 2   metoprolol succinate (TOPROL-XL) 100 MG 24 hr tablet, TAKE 1 TABLET BY MOUTH DAILY  IMMEDIATELY FOLLOWING A MEAL, Disp: 100 tablet, Rfl: 2   OVER THE COUNTER MEDICATION, 1 tablet in the morning and at bedtime. [facility] - Restore multivitamin, Disp: , Rfl:    sildenafil (REVATIO) 20 MG tablet, Take 1 tablet (20 mg total) by mouth daily as  needed. DO NOT USE with nitrates, Disp: 10 tablet, Rfl: 0   spironolactone (ALDACTONE) 25 MG tablet, TAKE ONE-HALF TABLET BY MOUTH  DAILY, Disp: 50 tablet, Rfl: 2   Study - [facility] - olezarsen 50 mg, 80 mg or placebo SQ injection (PI-Hilty), Inject 80 mg into the skin every 30 (thirty) days. For Investigational Use Only. Injection subcutaneously in protocol approved injection sites (abdomen, thigh or outer area of upper arm) every 4 weeks. Please contact [facility] for any questions or concerns regarding this medication., Disp: , Rfl:    tamsulosin (FLOMAX) 0.4 MG CAPS capsule, Take 1 capsule (0.4 mg total) by mouth daily. Annual appt due in July just see provider for future refills, Disp: 100 capsule, Rfl: 0   Accu-Chek Softclix Lancets lancets, Use as instructed, Disp: 100 each, Rfl: 12    meloxicam (MOBIC) 15 MG tablet, Take 1 tablet (15 mg total) by mouth daily. (Patient not taking: Reported on 09/30/2022), Disp: 90 tablet, Rfl: 3   metoprolol tartrate (LOPRESSOR) 100 MG tablet, Take 1 tablet (100 mg total) by mouth once for 1 dose. Please take one time dose 100mg  metoprolol tartrate 2 hr prior to cardiac CT for HR control IF HR >55bpm., Disp: 1 tablet, Rfl: 0  Current Facility-Administered Medications:    Study - E[facility] - olezarsen 50 mg, 80 mg or placebo SQ injection (PI-Hilty), 80 mg, Subcutaneous, Q28 days, Hilty, Nadean Corwin, MD

## 2022-09-30 NOTE — Progress Notes (Signed)
Remote ICD transmission.   

## 2022-10-06 DIAGNOSIS — C44212 Basal cell carcinoma of skin of right ear and external auricular canal: Secondary | ICD-10-CM | POA: Diagnosis not present

## 2022-10-06 DIAGNOSIS — Z85828 Personal history of other malignant neoplasm of skin: Secondary | ICD-10-CM | POA: Diagnosis not present

## 2022-10-06 DIAGNOSIS — D2371 Other benign neoplasm of skin of right lower limb, including hip: Secondary | ICD-10-CM | POA: Diagnosis not present

## 2022-10-06 DIAGNOSIS — L920 Granuloma annulare: Secondary | ICD-10-CM | POA: Diagnosis not present

## 2022-10-06 DIAGNOSIS — D485 Neoplasm of uncertain behavior of skin: Secondary | ICD-10-CM | POA: Diagnosis not present

## 2022-10-06 DIAGNOSIS — Z85820 Personal history of malignant melanoma of skin: Secondary | ICD-10-CM | POA: Diagnosis not present

## 2022-10-06 DIAGNOSIS — D225 Melanocytic nevi of trunk: Secondary | ICD-10-CM | POA: Diagnosis not present

## 2022-10-06 DIAGNOSIS — L570 Actinic keratosis: Secondary | ICD-10-CM | POA: Diagnosis not present

## 2022-10-06 DIAGNOSIS — D2271 Melanocytic nevi of right lower limb, including hip: Secondary | ICD-10-CM | POA: Diagnosis not present

## 2022-10-06 DIAGNOSIS — L821 Other seborrheic keratosis: Secondary | ICD-10-CM | POA: Diagnosis not present

## 2022-10-26 ENCOUNTER — Ambulatory Visit: Payer: Medicare Other | Attending: Cardiology

## 2022-10-26 DIAGNOSIS — I5022 Chronic systolic (congestive) heart failure: Secondary | ICD-10-CM

## 2022-10-26 DIAGNOSIS — Z95810 Presence of automatic (implantable) cardiac defibrillator: Secondary | ICD-10-CM

## 2022-10-28 ENCOUNTER — Other Ambulatory Visit: Payer: Self-pay

## 2022-10-28 ENCOUNTER — Encounter: Payer: Medicare Other | Admitting: *Deleted

## 2022-10-28 DIAGNOSIS — Z006 Encounter for examination for normal comparison and control in clinical research program: Secondary | ICD-10-CM

## 2022-10-28 MED ORDER — STUDY - ESSENCE - OLEZARSEN 50 MG, 80 MG OR PLACEBO SQ INJECTION (PI-HILTY)
80.0000 mg | INJECTION | SUBCUTANEOUS | Status: DC
  Administered 2022-10-28: 80 mg via SUBCUTANEOUS
  Filled 2022-10-28: qty 0.8

## 2022-10-28 NOTE — Research (Addendum)
TREATMENT DAY 309 - STUDY WEEK 45    Subject Number: [id number]              Randomization Number: [id number]             Date: 10-28-22      Vital Signs Collected - Blood Pressure:128/64 - Heart Rate:74 - Respiratory Rate:18 - Temperature:97.2 - Oxygen Saturation:99%   (abdominal pain only) since last visit   Assessment of ER Visits, Hospitalizations, and Inpatient Days   Adverse Events and Concomitant Medications   Diet, Lifestyle, and Alcohol Counseling    Study Drug:  Injection   Christopher [name] is here for Week 45 day 309 of [facility]. He reports no abd pain, no changes in his meds, and no visits to the Ed or Urgent care since last seen. Vs taken at 1130. Injection given in left lower abd at 1157 tol well, Kit number [id number]. Scheduled next visit for May 22 at 1130   Current Outpatient Medications:    Accu-Chek Softclix Lancets lancets, Use as instructed, Disp: 100 each, Rfl: 12   acetaminophen (TYLENOL) 500 MG tablet, Take 500 mg by mouth every 6 (six) hours as needed for mild pain or headache. , Disp: , Rfl:    atorvastatin (LIPITOR) 40 MG tablet, Take 1 tablet (40 mg total) by mouth daily. Annual appt due in May must see provider for future refills, Disp: 100 tablet, Rfl: 0   blood glucose meter kit and supplies KIT, Dispense based on patient and insurance preference. Use up to four times daily as directed., Disp: 1 each, Rfl: 0   cetirizine (ZYRTEC) 10 MG tablet, Take 10 mg by mouth daily., Disp: , Rfl:    cholecalciferol (VITAMIN D) 25 MCG (1000 UNIT) tablet, Take 1 tablet by mouth daily. , Disp: , Rfl:    clopidogrel (PLAVIX) 75 MG tablet, TAKE 1 TABLET BY MOUTH DAILY, Disp: 100 tablet, Rfl: 2   ENTRESTO 97-103 MG, TAKE 1 TABLET BY MOUTH TWICE A DAY, Disp: 180 tablet, Rfl: 3   FARXIGA 10 MG TABS tablet, TAKE 1 TABLET BY MOUTH DAILY BEFORE BREAKFAST., Disp: 90 tablet, Rfl: 3   fenofibrate (TRICOR) 145 MG tablet, Take 1 tablet (145 mg total) by mouth daily.  Annual appt due in May must see provider for future refills, Disp: 100 tablet, Rfl: 0   fluticasone (FLONASE) 50 MCG/ACT nasal spray, USE 2 SPRAYS IN BOTH NOSTRILS  DAILY, Disp: 64 g, Rfl: 0   glucose blood (ACCU-CHEK GUIDE) test strip, Use as instructed, Disp: 100 each, Rfl: 12   Insulin Lispro Prot & Lispro (HUMALOG MIX 75/25 KWIKPEN) (75-25) 100 UNIT/ML Kwikpen, Inject 20 units into skin in the morning and at bedtime, Disp: 45 mL, Rfl: 0   Insulin Pen Needle (PEN NEEDLES 31GX5/16") 31G X 8 MM MISC, Use twice a day for insulin injection, E11.9, Disp: 100 each, Rfl: 11   meloxicam (MOBIC) 15 MG tablet, Take 1 tablet (15 mg total) by mouth daily., Disp: 90 tablet, Rfl: 3   metFORMIN (GLUCOPHAGE) 1000 MG tablet, TAKE 1 TABLET BY MOUTH TWICE  DAILY WITH A MEAL, Disp: 200 tablet, Rfl: 2   metoprolol succinate (TOPROL-XL) 100 MG 24 hr tablet, TAKE 1 TABLET BY MOUTH DAILY  IMMEDIATELY FOLLOWING A MEAL, Disp: 100 tablet, Rfl: 2   OVER THE COUNTER MEDICATION, 1 tablet in the morning and at bedtime. [facility] - Restore multivitamin, Disp: , Rfl:    sildenafil (REVATIO) 20 MG tablet, Take 1 tablet (  20 mg total) by mouth daily as needed. DO NOT USE with nitrates, Disp: 10 tablet, Rfl: 0   spironolactone (ALDACTONE) 25 MG tablet, TAKE ONE-HALF TABLET BY MOUTH  DAILY, Disp: 50 tablet, Rfl: 2   Study - [facility] - olezarsen 50 mg, 80 mg or placebo SQ injection (PI-Hilty), Inject 80 mg into the skin every 30 (thirty) days. For Investigational Use Only. Injection subcutaneously in protocol approved injection sites (abdomen, thigh or outer area of upper arm) every 4 weeks. Please contact [facility] for any questions or concerns regarding this medication., Disp: , Rfl:    tamsulosin (FLOMAX) 0.4 MG CAPS capsule, Take 1 capsule (0.4 mg total) by mouth daily. Annual appt due in July just see provider for future refills, Disp: 100 capsule, Rfl: 0   metoprolol tartrate (LOPRESSOR) 100 MG  tablet, Take 1 tablet (100 mg total) by mouth once for 1 dose. Please take one time dose  metoprolol tartrate 2 hr prior to cardiac CT for HR control IF HR >55bpm., Disp: 1 tablet, Rfl: 0  Current Facility-Administered Medications:    Study - [facility] - olezarsen 50 mg, 80 mg or placebo SQ injection (PI-Hilty), 80 mg, Subcutaneous, Q28 days, Chrystie Nose, MD, 80 mg at 10/28/22 1157

## 2022-10-30 ENCOUNTER — Telehealth: Payer: Self-pay

## 2022-10-30 NOTE — Telephone Encounter (Signed)
Remote ICM transmission received.  Attempted call to patient regarding ICM remote transmission and no answer or voice mail option.  

## 2022-10-30 NOTE — Progress Notes (Signed)
EPIC Encounter for ICM Monitoring  Patient Name: Christopher [name] is a 72 y.o. male Date: 10/30/2022 Primary Care Physican: Myrlene Broker, MD Primary Cardiologist: [facility] Electrophysiologist: Elberta Fortis 03/18/2022 Weight: 234-235 lbs     05/27/2022 Weight: 234 lbs   09/25/2022 Weight: 234 lbs   Attempted call to patient and unable to reach.  Transmission reviewed.    Optivol thoracic impedance suggesting normal fluid levels.     Spironolactone 25 mg take 0.5 tablet (12.5 total) daily   Labs: 12/23/2021 Creatinine 1.19, BUN 17, Potassium 4.0, Sodium 142, GFR 65 11/18/2021 Creatinine 1.33, BUN 21, Potassium 3.8, Sodium 138 A complete set of results can be found in Results Review.   Recommendations:  Unable to reach.     Follow-up plan: ICM clinic phone appointment on 12/01/2022.   91 day device clinic remote transmission 11/16/2022.      EP/Cardiology Office Visits:  Recall 06/20/2023 with Dr Rennis Golden.  Recall 06/15/2022 with Otilio Saber.     Copy of ICM check sent to Dr. Elberta Fortis.  3 month ICM trend: 10/26/2022.    12-14 Month ICM trend:     Karie Soda, RN 10/30/2022 12:04 PM

## 2022-11-05 DIAGNOSIS — C44212 Basal cell carcinoma of skin of right ear and external auricular canal: Secondary | ICD-10-CM | POA: Diagnosis not present

## 2022-11-16 ENCOUNTER — Ambulatory Visit (INDEPENDENT_AMBULATORY_CARE_PROVIDER_SITE_OTHER): Payer: Medicare Other

## 2022-11-16 DIAGNOSIS — I469 Cardiac arrest, cause unspecified: Secondary | ICD-10-CM | POA: Diagnosis not present

## 2022-11-16 DIAGNOSIS — I4901 Ventricular fibrillation: Secondary | ICD-10-CM

## 2022-11-17 LAB — CUP PACEART REMOTE DEVICE CHECK
Battery Remaining Longevity: 65 mo
Battery Voltage: 2.96 V
Brady Statistic RV Percent Paced: 0.24 %
Date Time Interrogation Session: 20240514001803
HighPow Impedance: 71 Ohm
Implantable Lead Connection Status: 753985
Implantable Lead Implant Date: 20181102
Implantable Lead Location: 753860
Implantable Pulse Generator Implant Date: 20181102
Lead Channel Impedance Value: 437 Ohm
Lead Channel Impedance Value: 589 Ohm
Lead Channel Pacing Threshold Amplitude: 0.5 V
Lead Channel Pacing Threshold Pulse Width: 0.4 ms
Lead Channel Sensing Intrinsic Amplitude: 5.25 mV
Lead Channel Sensing Intrinsic Amplitude: 5.25 mV
Lead Channel Setting Pacing Amplitude: 2 V
Lead Channel Setting Pacing Pulse Width: 0.4 ms
Lead Channel Setting Sensing Sensitivity: 0.3 mV
Zone Setting Status: 755011
Zone Setting Status: 755011

## 2022-11-20 ENCOUNTER — Encounter: Payer: Self-pay | Admitting: Internal Medicine

## 2022-11-20 ENCOUNTER — Ambulatory Visit: Payer: Medicare Other | Admitting: Internal Medicine

## 2022-11-20 VITALS — BP 100/80 | HR 68 | Temp 98.5°F | Ht 70.0 in | Wt 236.0 lb

## 2022-11-20 DIAGNOSIS — E785 Hyperlipidemia, unspecified: Secondary | ICD-10-CM

## 2022-11-20 DIAGNOSIS — Z0000 Encounter for general adult medical examination without abnormal findings: Secondary | ICD-10-CM

## 2022-11-20 DIAGNOSIS — I5022 Chronic systolic (congestive) heart failure: Secondary | ICD-10-CM | POA: Diagnosis not present

## 2022-11-20 DIAGNOSIS — E118 Type 2 diabetes mellitus with unspecified complications: Secondary | ICD-10-CM

## 2022-11-20 DIAGNOSIS — E1169 Type 2 diabetes mellitus with other specified complication: Secondary | ICD-10-CM

## 2022-11-20 DIAGNOSIS — I428 Other cardiomyopathies: Secondary | ICD-10-CM | POA: Diagnosis not present

## 2022-11-20 DIAGNOSIS — Z794 Long term (current) use of insulin: Secondary | ICD-10-CM

## 2022-11-20 DIAGNOSIS — I10 Essential (primary) hypertension: Secondary | ICD-10-CM | POA: Diagnosis not present

## 2022-11-20 DIAGNOSIS — I480 Paroxysmal atrial fibrillation: Secondary | ICD-10-CM | POA: Diagnosis not present

## 2022-11-20 LAB — CBC
HCT: 46.2 % (ref 39.0–52.0)
Hemoglobin: 15.6 g/dL (ref 13.0–17.0)
MCHC: 33.8 g/dL (ref 30.0–36.0)
MCV: 91.6 fl (ref 78.0–100.0)
Platelets: 162 10*3/uL (ref 150.0–400.0)
RBC: 5.05 Mil/uL (ref 4.22–5.81)
RDW: 15.2 % (ref 11.5–15.5)
WBC: 6.1 10*3/uL (ref 4.0–10.5)

## 2022-11-20 LAB — COMPREHENSIVE METABOLIC PANEL
ALT: 27 U/L (ref 0–53)
AST: 26 U/L (ref 0–37)
Albumin: 4 g/dL (ref 3.5–5.2)
Alkaline Phosphatase: 37 U/L — ABNORMAL LOW (ref 39–117)
BUN: 19 mg/dL (ref 6–23)
CO2: 28 mEq/L (ref 19–32)
Calcium: 8.9 mg/dL (ref 8.4–10.5)
Chloride: 103 mEq/L (ref 96–112)
Creatinine, Ser: 1.22 mg/dL (ref 0.40–1.50)
GFR: 58.92 mL/min — ABNORMAL LOW (ref >60.00)
Glucose, Bld: 103 mg/dL — ABNORMAL HIGH (ref 70–99)
Potassium: 3.9 mEq/L (ref 3.5–5.1)
Sodium: 137 mEq/L (ref 135–145)
Total Bilirubin: 0.7 mg/dL (ref 0.2–1.2)
Total Protein: 6.6 g/dL (ref 6.0–8.3)

## 2022-11-20 LAB — MICROALBUMIN / CREATININE URINE RATIO
Creatinine,U: 116.7 mg/dL
Microalb Creat Ratio: 2.4 mg/g (ref 0.0–30.0)
Microalb, Ur: 2.8 mg/dL — ABNORMAL HIGH (ref 0.0–1.9)

## 2022-11-20 LAB — HEMOGLOBIN A1C: Hgb A1c MFr Bld: 5.8 % (ref 4.6–6.5)

## 2022-11-20 LAB — LIPID PANEL
Cholesterol: 126 mg/dL (ref 0–200)
HDL: 50.5 mg/dL (ref >39.00)
LDL Cholesterol: 60 mg/dL (ref 0–99)
NonHDL: 75.37
Total CHOL/HDL Ratio: 2
Triglycerides: 75 mg/dL (ref 0.0–149.0)
VLDL: 15 mg/dL (ref 0.0–40.0)

## 2022-11-20 MED ORDER — ATORVASTATIN CALCIUM 40 MG PO TABS: 40.0000 mg | ORAL_TABLET | Freq: Every day | ORAL | 3 refills | Status: DC

## 2022-11-20 MED ORDER — INSULIN LISPRO PROT & LISPRO (75-25 MIX) 100 UNIT/ML KWIKPEN: 18.0000 [IU] | PEN_INJECTOR | Freq: Two times a day (BID) | SUBCUTANEOUS | 3 refills | Status: DC

## 2022-11-20 MED ORDER — FENOFIBRATE 145 MG PO TABS: 145.0000 mg | ORAL_TABLET | Freq: Every day | ORAL | 3 refills | Status: DC

## 2022-11-20 MED ORDER — METOPROLOL SUCCINATE ER 100 MG PO TB24: 100.0000 mg | ORAL_TABLET | Freq: Every day | ORAL | 3 refills | Status: DC

## 2022-11-20 MED ORDER — TAMSULOSIN HCL 0.4 MG PO CAPS: 0.4000 mg | ORAL_CAPSULE | Freq: Every day | ORAL | 3 refills | Status: DC

## 2022-11-20 MED ORDER — ACCU-CHEK GUIDE VI STRP: ORAL_STRIP | 12 refills | Status: DC

## 2022-11-20 NOTE — Assessment & Plan Note (Signed)
No flare today and is on farxiga and spironolactone and entresto. BP at goal. Continue same. Checking CMP and adjust as needed.

## 2022-11-20 NOTE — Assessment & Plan Note (Signed)
Overall stable on optimal regimen with entresto and metoprolol and spironolactone and farxiga.

## 2022-11-20 NOTE — Progress Notes (Signed)
Subjective:   Patient ID: Christopher [name], male    DOB: 11-06-1950, 72 y.o.   MRN: [id number]  HPI Here for medicare wellness and physical, no new complaints. Please see A/P for status and treatment of chronic medical problems.   Diet: DM since diabetic Physical activity: sedentary Depression/mood screen: negative Hearing: mild to moderate loss bilaterally declines hearing exam Visual acuity: grossly normal, performs annual eye exam  ADLs: capable Fall risk: none Home safety: good Cognitive evaluation: intact to orientation, naming, recall and repetition EOL planning: adv directives discussed  Flowsheet Row Office Visit from 11/20/2022 in [facility] [place]  PHQ-2 Total Score 0       Flowsheet Row Office Visit from 11/20/2022 in [facility]  PHQ-9 Total Score 0         02/17/2021    9:15 PM 02/18/2021    8:00 AM 11/18/2021    1:06 PM 05/22/2022    1:43 PM 11/20/2022    1:38 PM  Fall Risk  Falls in the past year?   0 0 0  Was there an injury with Fall?   0 0 0  Fall Risk Category Calculator   0 0 0  Fall Risk Category (Retired)   Low Low   (RETIRED) Patient Fall Risk Level Moderate fall risk Moderate fall risk  Low fall risk   Fall risk Follow up    Falls evaluation completed Falls evaluation completed    I have personally reviewed and have noted 1. The patient's medical and social history - reviewed today no changes 2. Their use of alcohol, tobacco or illicit drugs 3. Their current medications and supplements 4. The patient's functional ability including ADL's, fall risks, home safety risks and hearing or visual impairment. 5. Diet and physical activities 6. Evidence for depression or mood disorders 7. Care team reviewed and updated 8.  The patient is not on an opioid pain medication.  Patient Care Team: Myrlene Broker, MD as PCP - General (Internal Medicine) [name] Range, MD (Inactive) as PCP -  Electrophysiology (Cardiology) Chrystie Nose, MD as PCP - Cardiology (Cardiology) [name], MD as Attending Physician (Cardiology) [name], MD (Inactive) as Attending Physician (Cardiothoracic Surgery) Waymon Budge, MD as Attending Physician (Pulmonary Disease) Tonny Bollman, MD as Attending Physician (Cardiology) Charna Andrei Mccook, MD as Consulting Physician (Gastroenterology) [name] Range, MD (Inactive) (Cardiology) Kathyrn Sheriff, [facility] as Pharmacist (Pharmacist) Past Medical History:  Diagnosis Date   Arthritis    Atrial fibrillation [facility])    post op, intol of anticoag   Cardiac arrest [facility])    a. s/p MDT single chamber ICD; 11-10-18- pt denies having a heart attack   Cataract    CHF (congestive heart failure) (HCC)    Chronic kidney disease    kidney stone   Coronary artery disease    a. 2/7 Cath: LM nl, LAD min irregs, LCX min irregs, RI 40, RCA 54m, EF 55-60% basal to mid inf HK, 3-4+ MR;  b. 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 Mitral valve prolapse with flail segment of posterior leaflet and severe MR by TEE, remote h/o bacterial endocarditis    Sleep apnea    NPSG 01/21/06- AHI 40.7/hr cpap   Stroke [facility])    summer 2022- one messed up vision, the other balance   Subacute bacterial endocarditis 03/22/2008   Strep viridans   Ventricular fibrillation (HCC) 11/07/2019   appropriate shock (36J) for VF delivered   Past Surgical History:  Procedure  Laterality Date   AMPUTATION  07/28/2012   Procedure: AMPUTATION DIGIT;  Surgeon: [name], MD;  Location: [facility] ORS;  Service: Orthopedics;  Laterality: Right;  2nd toe   CARDIAC CATHETERIZATION     COLONOSCOPY     EP IMPLANTABLE DEVICE N/A 05/01/2016   Procedure: Loop Recorder Removal;  Surgeon: [name] Range, MD;  Location: [facility] INVASIVE CV LAB;  Service: Cardiovascular;  Laterality: N/A;   FOOT SURGERY     BILATERAL TOES   ICD IMPLANT N/A 05/07/2017    Medtronic Visia AF MRI VR SureScan implanted by Dr Elberta Fortis following VF arrest   Implantable loop recorder placement  03/31/2013   MDT Linq implanted by Dr Johney Frame to evaluate for further afib   INTRAOPERATIVE TRANSESOPHAGEAL ECHOCARDIOGRAM N/A 09/27/2012   Procedure: INTRAOPERATIVE TRANSESOPHAGEAL ECHOCARDIOGRAM;  Surgeon: [name], MD;  Location: [facility] OR;  Service: Open Heart Surgery;  Laterality: N/A;   LEFT AND RIGHT HEART CATHETERIZATION WITH CORONARY ANGIOGRAM N/A 08/12/2012   Procedure: LEFT AND RIGHT HEART CATHETERIZATION WITH CORONARY ANGIOGRAM;  Surgeon: [name], MD;  Location: [facility] CATH LAB;  Service: Cardiovascular;  Laterality: N/A;   LEFT HEART CATH AND CORONARY ANGIOGRAPHY N/A 05/06/2017   Procedure: LEFT HEART CATH AND CORONARY ANGIOGRAPHY;  Surgeon: Marykay Lex, MD;  Location: [facility] INVASIVE CV LAB;  Service: Cardiovascular;  Laterality: N/A;   LOOP RECORDER IMPLANT N/A 03/31/2013   Procedure: LOOP RECORDER IMPLANT;  Surgeon: Gardiner Rhyme, MD;  Location: [facility] CATH LAB;  Service: Cardiovascular;  Laterality: N/A;   MITRAL VALVE REPAIR Right 09/27/2012   Procedure: MINIMALLY INVASIVE MITRAL VALVE REPAIR (MVR);  Surgeon: [name], MD;  Location: [facility] OR;  Service: Open Heart Surgery;  Laterality: Right;  Ultrasound guided   PERCUTANEOUS CORONARY STENT INTERVENTION (PCI-S) N/A 08/25/2012   Procedure: PERCUTANEOUS CORONARY STENT INTERVENTION (PCI-S);  Surgeon: Tonny Bollman, MD;  Location: [facility] CATH LAB;   Service: Cardiovascular;  Laterality: N/A;   POLYPECTOMY     SEPTOPLASTY     Dr. [name]   TEE WITHOUT CARDIOVERSION N/A 08/12/2012   Procedure: TRANSESOPHAGEAL ECHOCARDIOGRAM (TEE);  Surgeon: [name], MD;  Location: [facility] ENDOSCOPY;  Service: Cardiovascular;  Laterality: N/A;   TONSILLECTOMY     UVULOPALATOPHARYNGOPLASTY     Family History  Problem Relation Age of Onset   Diabetes Mother    Stroke Mother    Heart disease Father    Colon cancer Father 91   Diabetes Father    Renal cancer Father    Sudden death Brother    Heart attack Brother    Esophageal cancer Neg Hx    Rectal cancer Neg Hx    Stomach cancer Neg Hx    Colon polyps Neg Hx    Review of Systems  Constitutional: Negative.   HENT: Negative.    Eyes: Negative.   Respiratory:  Negative for cough, chest  tightness and shortness of breath.   Cardiovascular:  Negative for chest pain, palpitations and leg swelling.  Gastrointestinal:  Negative for abdominal distention, abdominal pain, constipation, diarrhea, nausea and vomiting.  Musculoskeletal: Negative.   Skin: Negative.   Neurological: Negative.   Psychiatric/Behavioral: Negative.      Objective:  Physical Exam Constitutional:      Appearance: He is well-developed.  HENT:     Head: Normocephalic and atraumatic.  Cardiovascular:     Rate and Rhythm: Normal rate and regular rhythm.  Pulmonary:     Effort: Pulmonary effort is normal. No respiratory distress.     Breath sounds: Normal breath sounds. No wheezing or rales.  Abdominal:     General: Bowel sounds are normal. There is no distension.     Palpations: Abdomen is soft.     Tenderness: There is no abdominal tenderness. There is no rebound.  Musculoskeletal:     Cervical back: Normal range of motion.  Skin:    General: Skin is warm and dry.  Neurological:     Mental Status: He is alert and oriented to person, place, and time.     Coordination: Coordination normal.     Vitals:   11/20/22  1335  BP: 100/80  Pulse: 68  Temp: 98.5 F (36.9 C)  TempSrc: Oral  SpO2: 97%  Weight: 236 lb (107 kg)  Height: 5\' 10"  (1.778 m)    Assessment & Plan:

## 2022-11-20 NOTE — Assessment & Plan Note (Signed)
Checking lipid panel and adjust lipitor 40 mg daily and fenofibrate 145 mg daily as needed. LDL goal <100.

## 2022-11-20 NOTE — Assessment & Plan Note (Signed)
Checking CMP and adjust regimen as needed. Controlled on entresto and metoprolol and spironolactone.

## 2022-11-20 NOTE — Assessment & Plan Note (Signed)
Foot exam done, checking HgA1c, microalbumin to creatinine ratio and lipid panel and CMP. Adjust as needed humalog 75/25 18 units BID and metformin 1000 mg BID and farxiga 10 mg daily. On statin and ACE-I.

## 2022-11-20 NOTE — Assessment & Plan Note (Signed)
Flu shot yearly. Pneumonia complete. Shingrix complete. Tetanus due at pharmacy. Colonoscopy due 2026. Counseled about sun safety and mole surveillance. Counseled about the dangers of distracted driving. Given 10 year screening recommendations.

## 2022-11-20 NOTE — Assessment & Plan Note (Addendum)
Sounds regular today and rate controlled with metoprolol 100 xl daily. On plavix for stroke prevention. Will continue same.

## 2022-11-24 ENCOUNTER — Encounter: Payer: Self-pay | Admitting: Internal Medicine

## 2022-11-25 ENCOUNTER — Encounter: Payer: Medicare Other | Admitting: *Deleted

## 2022-11-25 ENCOUNTER — Other Ambulatory Visit: Payer: Self-pay

## 2022-11-25 DIAGNOSIS — Z006 Encounter for examination for normal comparison and control in clinical research program: Secondary | ICD-10-CM

## 2022-11-25 MED ORDER — STUDY - ESSENCE - OLEZARSEN 50 MG, 80 MG OR PLACEBO SQ INJECTION (PI-HILTY)
80.0000 mg | INJECTION | SUBCUTANEOUS | Status: DC
  Administered 2022-11-25: 80 mg via SUBCUTANEOUS
  Filled 2022-11-25: qty 0.8

## 2022-11-25 NOTE — Research (Signed)
TREATMENT DAY 337 - STUDY WEEK 49    Subject Number: [id number]           Randomization Number:  [id number]          Date:25-Nov-2022      [x] Vital Signs Collected - Blood Pressure:136/84 - Heart Rate:62 - Respiratory Rate:16 - Temperature:97.9 - Oxygen Saturation: 95%  [x]   (abdominal pain only) since last visit  [x]  Assessment of ER Visits, Hospitalizations, and Inpatient Days  [x]  Adverse Events and Concomitant Medications  [x]  Diet, Lifestyle, and Alcohol Counseling   [x]  Study Drug:  Injection   Christopher [name] is here for Week 49 day 337 of Essence research. He reports no abd pain, no visits to the Ed or Urgent care, and no changes in his meds since last seen. VS taken at 1213. Injection given at 1235 in left upper abd kit number [id number] tol well. Scheduled next visit for June 19 at 1130   Current Outpatient Medications:    Accu-Chek Softclix Lancets lancets, Use as instructed, Disp: 100 each, Rfl: 12   acetaminophen (TYLENOL) 500 MG tablet, Take 500 mg by mouth every 6 (six) hours as needed for mild pain or headache. , Disp: , Rfl:    atorvastatin (LIPITOR) 40 MG tablet, Take 1 tablet (40 mg total) by mouth daily., Disp: 100 tablet, Rfl: 3   blood glucose meter kit and supplies KIT, Dispense based on patient and insurance preference. Use up to four times daily as directed., Disp: 1 each, Rfl: 0   cetirizine (ZYRTEC) 10 MG tablet, Take 10 mg by mouth daily., Disp: , Rfl:    cholecalciferol (VITAMIN D) 25 MCG (1000 UNIT) tablet, Take 1 tablet by mouth daily. , Disp: , Rfl:    clopidogrel (PLAVIX) 75 MG tablet, TAKE 1 TABLET BY MOUTH DAILY, Disp: 100 tablet, Rfl: 2   ENTRESTO 97-103 MG, TAKE 1 TABLET BY MOUTH TWICE A DAY, Disp: 180 tablet, Rfl: 3   FARXIGA 10 MG TABS tablet, TAKE 1 TABLET BY MOUTH DAILY BEFORE BREAKFAST., Disp: 90 tablet, Rfl: 3   fenofibrate (TRICOR) 145 MG tablet, Take 1 tablet (145 mg total) by mouth daily., Disp: 100 tablet, Rfl: 3   fluticasone (FLONASE) 50  MCG/ACT nasal spray, USE 2 SPRAYS IN BOTH NOSTRILS  DAILY, Disp: 64 g, Rfl: 0   glucose blood (ACCU-CHEK GUIDE) test strip, Use as instructed, Disp: 100 each, Rfl: 12   Insulin Lispro Prot & Lispro (HUMALOG MIX 75/25 KWIKPEN) (75-25) 100 UNIT/ML Kwikpen, Inject 18 Units into the skin in the morning and at bedtime., Disp: 45 mL, Rfl: 3   Insulin Pen Needle (PEN NEEDLES 31GX5/16") 31G X 8 MM MISC, Use twice a day for insulin injection, E11.9, Disp: 100 each, Rfl: 11   metFORMIN (GLUCOPHAGE) 1000 MG tablet, TAKE 1 TABLET BY MOUTH TWICE  DAILY WITH A MEAL, Disp: 200 tablet, Rfl: 2   metoprolol succinate (TOPROL-XL) 100 MG 24 hr tablet, Take 1 tablet (100 mg total) by mouth daily., Disp: 100 tablet, Rfl: 3   OVER THE COUNTER MEDICATION, 1 tablet in the morning and at bedtime. [facility] - Restore multivitamin, Disp: , Rfl:    sildenafil (REVATIO) 20 MG tablet, Take 1 tablet (20 mg total) by mouth daily as needed. DO NOT USE with nitrates, Disp: 10 tablet, Rfl: 0   spironolactone (ALDACTONE) 25 MG tablet, TAKE ONE-HALF TABLET BY MOUTH  DAILY, Disp: 50 tablet, Rfl: 2   Study - ESSENCE - olezarsen 50 mg, 80  mg or placebo SQ injection (PI-Hilty), Inject 80 mg into the skin every 30 (thirty) days. For Investigational Use Only. Injection subcutaneously in protocol approved injection sites (abdomen, thigh or outer area of upper arm) every 4 weeks. Please contact [facility] for any questions or concerns regarding this medication., Disp: , Rfl:    tamsulosin (FLOMAX) 0.4 MG CAPS capsule, Take 1 capsule (0.4 mg total) by mouth daily., Disp: 100 capsule, Rfl: 3   meloxicam (MOBIC) 15 MG tablet, Take 1 tablet (15 mg total) by mouth daily. (Patient not taking: Reported on 11/25/2022), Disp: 90 tablet, Rfl: 3  Current Facility-Administered Medications:    Study - ESSENCE - olezarsen 50 mg, 80 mg or placebo SQ injection (PI-Hilty), 80 mg, Subcutaneous, Q28 days, Hilty, Lisette Abu, MD

## 2022-12-01 ENCOUNTER — Ambulatory Visit: Payer: Medicare Other | Attending: Cardiology

## 2022-12-01 DIAGNOSIS — Z95810 Presence of automatic (implantable) cardiac defibrillator: Secondary | ICD-10-CM | POA: Diagnosis not present

## 2022-12-01 DIAGNOSIS — I5022 Chronic systolic (congestive) heart failure: Secondary | ICD-10-CM

## 2022-12-04 ENCOUNTER — Telehealth (HOSPITAL_COMMUNITY): Payer: Self-pay | Admitting: Emergency Medicine

## 2022-12-04 NOTE — Progress Notes (Signed)
EPIC Encounter for ICM Monitoring  Patient Name: Christopher [name] is a 72 y.o. male Date: 12/04/2022 Primary Care Physican: Myrlene Broker, MD Primary Cardiologist: [facility] Electrophysiologist: Elberta Fortis 09/25/2022 Weight: 234 lbs 12/04/2022 Weight: 233-235 lbs    Spoke with patient and heart failure questions reviewed.  Transmission results reviewed.  Pt asymptomatic for fluid accumulation.  Reports feeling well at this time and voices no complaints.     Optivol thoracic impedance suggesting normal fluid levels.     Spironolactone 25 mg take 0.5 tablet (12.5 total) daily   Labs: 11/20/2022 Creatinine 1.22, BUN 19, Potassium 3.9, Sodium 137, GFR 58.92 12/23/2021 Creatinine 1.19, BUN 17, Potassium 4.0, Sodium 142, GFR 65 11/18/2021 Creatinine 1.33, BUN 21, Potassium 3.8, Sodium 138 A complete set of results can be found in Results Review.   Recommendations:  No changes and encouraged to call if experiencing any fluid symptoms.   Follow-up plan: ICM clinic phone appointment on 01/04/2023.   91 day device clinic remote transmission 02/15/2023.      EP/Cardiology Office Visits:  Recall 06/20/2023 with Dr Rennis Golden.  Advised to call office for EP appointment. Recall 06/15/2022 with Otilio Saber.     Copy of ICM check sent to Dr. Elberta Fortis.  3 month ICM trend: 12/01/2022.    12-14 Month ICM trend:     Karie Soda, RN 12/04/2022 8:42 AM

## 2022-12-04 NOTE — Telephone Encounter (Signed)
Reaching out to patient to offer assistance regarding upcoming cardiac imaging study; pt verbalizes understanding of appt date/time, parking situation and where to check in, pre-test NPO status and medications ordered, and verified current allergies; name and call back number provided for further questions should they arise [place] Luppino RN Navigator Cardiac Imaging  Heart and Vascular [phone] office [phone] cell 

## 2022-12-07 ENCOUNTER — Ambulatory Visit (HOSPITAL_COMMUNITY)
Admission: RE | Admit: 2022-12-07 | Discharge: 2022-12-07 | Disposition: A | Discharge status: Home / Self Care | Payer: Medicare Other | Source: Ambulatory Visit | Attending: Internal Medicine | Admitting: Internal Medicine

## 2022-12-07 DIAGNOSIS — E785 Hyperlipidemia, unspecified: Secondary | ICD-10-CM | POA: Insufficient documentation

## 2022-12-07 DIAGNOSIS — J90 Pleural effusion, not elsewhere classified: Secondary | ICD-10-CM | POA: Diagnosis not present

## 2022-12-07 DIAGNOSIS — I2510 Atherosclerotic heart disease of native coronary artery without angina pectoris: Secondary | ICD-10-CM

## 2022-12-07 MED ORDER — NITROGLYCERIN 0.4 MG SL SUBL
SUBLINGUAL_TABLET | SUBLINGUAL | Status: AC
  Filled 2022-12-07: qty 2

## 2022-12-07 MED ORDER — IOHEXOL 350 MG/ML SOLN
95.0000 mL | Freq: Once | INTRAVENOUS | Status: AC | PRN
  Administered 2022-12-07: 95 mL via INTRAVENOUS

## 2022-12-07 MED ORDER — NITROGLYCERIN 0.4 MG SL SUBL
0.8000 mg | SUBLINGUAL_TABLET | Freq: Once | SUBLINGUAL | Status: AC
  Administered 2022-12-07: 0.8 mg via SUBLINGUAL

## 2022-12-07 MED ORDER — NITROGLYCERIN 0.4 MG SL SUBL: 0.8000 mg | SUBLINGUAL_TABLET | Freq: Once | SUBLINGUAL | Status: DC

## 2022-12-07 NOTE — Progress Notes (Signed)
Patient tolerated CT well.Vital signs stable encourage to drink water throughout day.Reasons explained and verbalized understanding. Ambulated steady gait.   

## 2022-12-09 ENCOUNTER — Other Ambulatory Visit: Payer: Self-pay | Admitting: Internal Medicine

## 2022-12-14 NOTE — Progress Notes (Signed)
Remote ICD transmission.   

## 2022-12-23 ENCOUNTER — Encounter: Payer: Self-pay | Admitting: *Deleted

## 2022-12-23 ENCOUNTER — Encounter: Payer: Medicare Other | Admitting: *Deleted

## 2022-12-23 VITALS — BP 132/76 | HR 60 | Temp 97.3°F | Resp 18 | Wt 234.0 lb

## 2022-12-23 DIAGNOSIS — Z006 Encounter for examination for normal comparison and control in clinical research program: Secondary | ICD-10-CM

## 2022-12-23 NOTE — Research (Addendum)
Christopher [name] Week 53 Day 365 23-Dec-2022          Chemistry:  Alkaline Phosphatase 40 U/L                      [] Clinically Significant  [x] Not Clinically Significant Hs-C-Reactive protein 4.7 mg/L                  [] Clinically Significant  [x] Not Clinically Significant  Hematology: RDW 14.9 %                                                 [] Clinically Significant  [x] Not Clinically Significant   Urinalysis: Glucose  >1000  mg/dL                                 [] Clinically Significant  [x] Not Clinically Significant  Urine Chemistry: Albumin Creatinine Ratio 36 mg/g                 [] Clinically Significant  [x] Not Clinically Significant  Coagulation Activated Partial Thromboplastin Time 22.3   [] Clinically Significant  [x] Not Clinically Significant      Any further action needed to be taken per the PI? No  Chrystie Nose, MD, [facility], FACP  [place]  [facility]  Medical Director of the [facility]  Cardiovascular Risk Reduction Clinic Diplomate of the American Board of Clinical Lipidology Attending Cardiologist  Direct Dial: [phone]  Fax: [phone]  Website:  [email or web address]     TREATMENT DAY 365 - STUDY WEEK 53    Subject Number: [id number]            Randomization [id number]            Date: 23-December-2022      [x] Vital Signs Collected - Blood Pressure:132/76 - Weight:234 lbs - Heart Rate:60 - Respiratory Rate:18 - Temperature:97.3 - Oxygen Saturation:98%  [x]  Physical Exam Completed by PI or SUB-I  [x]  12-lead ECG  [x]  Extended Urinalysis   [x]  Lab collection per protocol  [x]   (abdominal pain only) since last visit  [x]  Assessment of ER Visits, Hospitalizations, and Inpatient Days  [x]  Adverse Events and Concomitant Medications  [x]  Diet, Lifestyle, and Alcohol Counseling   [name] is here for Week 53 Day 365 of [facility] research. He reports no abd pain, no changes in his meds, and no visits to the Ed  or urgent care since last seen. VS taken at 1133. Blood work drawn at 1150. Urine obtained at 1201. Dr Riley Kill did exam, and reviewed EKG .   Current Outpatient Medications:    Accu-Chek Softclix Lancets lancets, Use as instructed, Disp: 100 each, Rfl: 12   acetaminophen (TYLENOL) 500 MG tablet, Take 500 mg by mouth every 6 (six) hours as needed for mild pain or headache. , Disp: , Rfl:    atorvastatin (LIPITOR) 40 MG tablet, Take 1 tablet (40 mg total) by mouth daily., Disp: 100 tablet, Rfl: 3   blood glucose meter kit and supplies KIT, Dispense based on patient and insurance preference. Use up to four times daily as directed., Disp: 1 each, Rfl: 0   cetirizine (ZYRTEC) 10 MG tablet, Take 10 mg by mouth daily., Disp: , Rfl:    cholecalciferol (VITAMIN D) 25 MCG (1000 UNIT) tablet, Take 1  tablet by mouth daily. , Disp: , Rfl:    clopidogrel (PLAVIX) 75 MG tablet, TAKE 1 TABLET BY MOUTH DAILY, Disp: 100 tablet, Rfl: 2   ENTRESTO 97-103 MG, TAKE 1 TABLET BY MOUTH TWICE A DAY, Disp: 180 tablet, Rfl: 3   FARXIGA 10 MG TABS tablet, TAKE 1 TABLET BY MOUTH EVERY DAY BEFORE BREAKFAST, Disp: 90 tablet, Rfl: 3   fenofibrate (TRICOR) 145 MG tablet, Take 1 tablet (145 mg total) by mouth daily., Disp: 100 tablet, Rfl: 3   fluticasone (FLONASE) 50 MCG/ACT nasal spray, USE 2 SPRAYS IN BOTH NOSTRILS  DAILY, Disp: 64 g, Rfl: 0   glucose blood (ACCU-CHEK GUIDE) test strip, Use as instructed, Disp: 100 each, Rfl: 12   Insulin Lispro Prot & Lispro (HUMALOG MIX 75/25 KWIKPEN) (75-25) 100 UNIT/ML Kwikpen, Inject 18 Units into the skin in the morning and at bedtime., Disp: 45 mL, Rfl: 3   Insulin Pen Needle (PEN NEEDLES 31GX5/16") 31G X 8 MM MISC, Use twice a day for insulin injection, E11.9, Disp: 100 each, Rfl: 11   meloxicam (MOBIC) 15 MG tablet, Take 1 tablet (15 mg total) by mouth daily. (Patient not taking: Reported on 11/25/2022), Disp: 90 tablet, Rfl: 3   metFORMIN (GLUCOPHAGE) 1000 MG tablet, TAKE 1 TABLET BY  MOUTH TWICE  DAILY WITH A MEAL, Disp: 200 tablet, Rfl: 2   metoprolol succinate (TOPROL-XL) 100 MG 24 hr tablet, Take 1 tablet (100 mg total) by mouth daily., Disp: 100 tablet, Rfl: 3   OVER THE COUNTER MEDICATION, 1 tablet in the morning and at bedtime. [facility] - Restore multivitamin, Disp: , Rfl:    sildenafil (REVATIO) 20 MG tablet, Take 1 tablet (20 mg total) by mouth daily as needed. DO NOT USE with nitrates, Disp: 10 tablet, Rfl: 0   spironolactone (ALDACTONE) 25 MG tablet, TAKE ONE-HALF TABLET BY MOUTH  DAILY, Disp: 50 tablet, Rfl: 2   Study - ESSENCE - olezarsen 50 mg, 80 mg or placebo SQ injection (PI-Hilty), Inject 80 mg into the skin every 30 (thirty) days. For Investigational Use Only. Injection subcutaneously in protocol approved injection sites (abdomen, thigh or outer area of upper arm) every 4 weeks. Please contact [facility] for any questions or concerns regarding this medication., Disp: , Rfl:    tamsulosin (FLOMAX) 0.4 MG CAPS capsule, Take 1 capsule (0.4 mg total) by mouth daily., Disp: 100 capsule, Rfl: 3  Current Facility-Administered Medications:    Study - ESSENCE - olezarsen 50 mg, 80 mg or placebo SQ injection (PI-Hilty), 80 mg, Subcutaneous, Q28 days, Hilty, Lisette Abu, MD, 80 mg at 11/25/22 1235

## 2022-12-24 NOTE — Progress Notes (Signed)
Patient is in for completion of ESSENCE week 53 visit.  Overall doing well.  No major symptoms at present.  His chest discomfort has improved. He has had no side effects from his IP, and thinks he feels much better.     T 97.3  P 60 R 18  BP 132/76 R unlabored Alert, oriented in NAD Lungs clear except for minimal crackles RLB clears partially with cough Cor reg with occasional irregularities Abd soft without HS megaly Ext  no definite edema   ECG  NSR with first degree AV block.  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 Occasional PVC's similar to prior tracing of 06/20/21   Impression  Week 53 ESSENCE stable   Plan:  plan fu in clinical trial.     Arturo Morton. Riley Kill, MD, [facility] Medical Director, [facility]

## 2023-01-08 NOTE — Progress Notes (Signed)
No ICM remote transmission received for 01/04/2023 and next ICM transmission scheduled for 01/19/2023.

## 2023-01-18 ENCOUNTER — Encounter: Payer: Self-pay | Admitting: *Deleted

## 2023-01-18 DIAGNOSIS — Z006 Encounter for examination for normal comparison and control in clinical research program: Secondary | ICD-10-CM

## 2023-01-18 NOTE — Research (Signed)
Spoke with Christopher [name] as part of the Essence follow up. He reports no changes in his meds, no visits to the Ed or Urgent care, and no abd pain. Informed him I will be calling him in Aug. For another follow up phone call. Voices understanding.

## 2023-01-19 ENCOUNTER — Ambulatory Visit (INDEPENDENT_AMBULATORY_CARE_PROVIDER_SITE_OTHER): Payer: Medicare Other

## 2023-01-19 DIAGNOSIS — Z95810 Presence of automatic (implantable) cardiac defibrillator: Secondary | ICD-10-CM

## 2023-01-19 DIAGNOSIS — I5022 Chronic systolic (congestive) heart failure: Secondary | ICD-10-CM | POA: Diagnosis not present

## 2023-01-24 NOTE — Progress Notes (Signed)
EPIC Encounter for ICM Monitoring  Patient Name: Christopher [name] is a 72 y.o. male Date: 01/24/2023 Primary Care Physican: Myrlene Broker, MD Primary Cardiologist: [facility] Electrophysiologist: Elberta Fortis 09/25/2022 Weight: 234 lbs 12/04/2022 Weight: 233-235 lbs     Transmission results reviewed.     Optivol thoracic impedance suggesting normal fluid levels.     Spironolactone 25 mg take 0.5 tablet (12.5 total) daily   Labs: 11/20/2022 Creatinine 1.22, BUN 19, Potassium 3.9, Sodium 137, GFR 58.92 12/23/2021 Creatinine 1.19, BUN 17, Potassium 4.0, Sodium 142, GFR 65 11/18/2021 Creatinine 1.33, BUN 21, Potassium 3.8, Sodium 138 A complete set of results can be found in Results Review.   Recommendations:  No changes.   Follow-up plan: ICM clinic phone appointment on 02/22/2023.   91 day device clinic remote transmission 02/15/2023.      EP/Cardiology Office Visits:  06/14/2023 with Dr Rennis Golden.  Aware to call office for EP appointment. Recall 06/15/2022 with Otilio Saber.     Copy of ICM check sent to Dr. Elberta Fortis.  3 month ICM trend: 01/19/2023.    12-14 Month ICM trend:     Karie Soda, RN 01/24/2023 10:45 AM

## 2023-02-04 ENCOUNTER — Other Ambulatory Visit: Payer: Self-pay | Admitting: Internal Medicine

## 2023-02-12 ENCOUNTER — Other Ambulatory Visit: Payer: Self-pay | Admitting: Internal Medicine

## 2023-02-15 ENCOUNTER — Ambulatory Visit (INDEPENDENT_AMBULATORY_CARE_PROVIDER_SITE_OTHER): Payer: Medicare Other

## 2023-02-15 DIAGNOSIS — I255 Ischemic cardiomyopathy: Secondary | ICD-10-CM

## 2023-02-15 DIAGNOSIS — I5022 Chronic systolic (congestive) heart failure: Secondary | ICD-10-CM

## 2023-02-18 ENCOUNTER — Encounter: Payer: Self-pay | Admitting: *Deleted

## 2023-02-18 DIAGNOSIS — Z006 Encounter for examination for normal comparison and control in clinical research program: Secondary | ICD-10-CM

## 2023-02-18 NOTE — Research (Signed)
Spoke with Christopher [name] as part of the Essence follow up . Christopher [name] states no changed in his meds, no visits to the ed or urgent care, and no abd pain.

## 2023-02-22 ENCOUNTER — Ambulatory Visit: Payer: Medicare Other | Attending: Cardiology

## 2023-02-22 DIAGNOSIS — Z95810 Presence of automatic (implantable) cardiac defibrillator: Secondary | ICD-10-CM | POA: Diagnosis not present

## 2023-02-22 DIAGNOSIS — I5022 Chronic systolic (congestive) heart failure: Secondary | ICD-10-CM

## 2023-02-25 NOTE — Progress Notes (Signed)
EPIC Encounter for ICM Monitoring  Patient Name: Christopher [name] is a 72 y.o. male Date: 02/25/2023 Primary Care Physican: Myrlene Broker, MD Primary Cardiologist: [facility] Electrophysiologist: Elberta Fortis 09/25/2022 Weight: 234 lbs 12/04/2022 Weight: 233-235 lbs     Spoke with patient and heart failure questions reviewed.  Transmission results reviewed.  Pt asymptomatic for fluid accumulation.  Reports feeling well at this time and voices no complaints.     Optivol thoracic impedance suggesting normal fluid levels.     Spironolactone 25 mg take 0.5 tablet (12.5 total) daily   Labs: 11/20/2022 Creatinine 1.22, BUN 19, Potassium 3.9, Sodium 137, GFR 58.92 12/23/2021 Creatinine 1.19, BUN 17, Potassium 4.0, Sodium 142, GFR 65 11/18/2021 Creatinine 1.33, BUN 21, Potassium 3.8, Sodium 138 A complete set of results can be found in Results Review.   Recommendations:  No changes and encouraged to call if experiencing any fluid symptoms.   Follow-up plan: ICM clinic phone appointment on 03/29/2023.   91 day device clinic remote transmission 05/17/2023.      EP/Cardiology Office Visits:  06/14/2023 with Dr Rennis Golden.  Advised to call the office for EP appointment. Recall 06/15/2022 with Otilio Saber.     Copy of ICM check sent to Dr. Elberta Fortis.  3 month ICM trend: 02/22/2023.    12-14 Month ICM trend:     Karie Soda, RN 02/25/2023 9:20 AM

## 2023-03-01 NOTE — Progress Notes (Signed)
Remote ICD transmission.   

## 2023-03-12 ENCOUNTER — Other Ambulatory Visit: Payer: Self-pay | Admitting: Internal Medicine

## 2023-03-24 ENCOUNTER — Encounter: Payer: Medicare Other | Admitting: *Deleted

## 2023-03-24 VITALS — BP 119/73 | HR 68 | Temp 97.6°F | Resp 20

## 2023-03-24 DIAGNOSIS — Z006 Encounter for examination for normal comparison and control in clinical research program: Secondary | ICD-10-CM

## 2023-03-24 NOTE — Progress Notes (Addendum)
Patient here for final ESSENCE study visit.  He has known CAD.  He has been off of IP now for some time.  He has a scheduled visit with Dr. Cyndie Chime in November.  He has had what appears to be progression on coronary CTA with probable occlusion.  Dr. Rennis Golden reviewed the CT scan and this was discussed with patient as noted.  He is overall seemingly well.  No definite complaints.        BP 119/73  BP Location Left Arm  Patient Position Sitting  Cuff Size Normal  Pulse 68  Resp 20  Temp 97.6 F (36.4 C)  SpO2 100 %    Alert, oriented NAD VS as noted Lungs clear to auscultation and percussion Cor occassional irregularity Abd - no hepatosplenomegaly Ext no edema  NSR.  Occassional to frequent PVCs.  Left axis deviation.  Old inferior MI. IVCD No definite change from 12/22,   Impression:   Known cardiomyopathy with progression of CAD  Completion of ESSENCE trial for elevated triglycerides CKD, stage III Close follow up and labs with Dr. Okey Dupre Scheduled appointment with Dr. Rennis Golden Dr. Johney Frame no longer seeing patients  Christopher [name]. Riley Kill, MD Medical Director, [facility]

## 2023-03-24 NOTE — Research (Addendum)
Christopher [name] 18-Sept-2024 Week 13 Follow up- day 91 Essence            Chemistry: Uric Acid 3.7 mg/dL                      [] Clinically Significant  [x] Not Clinically Significant    Hematology: RDW 15.0                                     [] Clinically Significant  [x] Not Clinically Significant   Urinalysis: Glucose >1000      mg/dL              [] Clinically Significant  [x] Not Clinically Significant Protein 30 mg/dL                           [] Clinically Significant  [x] Not Clinically Significant Urinary White Blood cells 6-9        [] Clinically Significant  [x] Not Clinically Significant Bacteria  1+                                   [] Clinically Significant  [x] Not Clinically Significant Mucus 2+                                      [] Clinically Significant  [x] Not Clinically Significant   Urine Chemistry: Urine Protein 36 mg/dL                   [] Clinically Significant  [x] Not Clinically Significant Urine Albumin 13.50 mg/dL             [] Clinically Significant  [x] Not Clinically Significant Protein Creatinine Ratio 288 mg/g  [] Clinically Significant  [x] Not Clinically Significant Albumin Creatinine Ratio 108 mg/g [] Clinically Significant  [x] Not Clinically Significant     Any further action needed to be taken per the PI? No  Christopher Nose, MD, [facility], FACP  [place]  [facility]  Medical Director of the Advanced Lipid Disorders &  Cardiovascular Risk Reduction Clinic Diplomate of the American Board of Clinical Lipidology Attending Cardiologist  Direct Dial: [phone]  Fax: [phone]  Website:  [email or web address]     Week 13 post Day 91 visit Essence    Subject Number: [id number]              Randomization [id number]             Date:18-Sept-2024      [x] Vital Signs Collected - Blood Pressure:119/73 - Weight:235 lbs - Heart Rate:68 - Respiratory Rate:20 - Temperature:97.6 - Oxygen Saturation:100%  [x]  Physical Exam Completed by PI  or SUB-I  [x]  12-lead ECG  [x]  Extended Urinalysis   [x]  Lab collection per protocol  [x]   (abdominal pain only) since last visit  [x]  Assessment of ER Visits, Hospitalizations, and Inpatient Days  [x]  Adverse Events and Concomitant Medications  [x]  Diet, Lifestyle, and Alcohol Counseling   []  Study Drug: [place] Injection   Christopher [name] is here for Post Week 13 of Essence. He reports no abd pain, no changes in his meds, and no visits to the ed or urgent care since  I last spoke to him. VS completed at 1048.Urine obtained at 1121. Dr Christopher [name] did exam and reviewed EKG. Blood  drawn at 1105   Current Outpatient Medications:    Accu-Chek Softclix Lancets lancets, Use as instructed, Disp: 100 each, Rfl: 12   acetaminophen (TYLENOL) 500 MG tablet, Take 500 mg by mouth every 6 (six) hours as needed for mild pain or headache. , Disp: , Rfl:    atorvastatin (LIPITOR) 40 MG tablet, Take 1 tablet (40 mg total) by mouth daily., Disp: 100 tablet, Rfl: 3   blood glucose meter kit and supplies KIT, Dispense based on patient and insurance preference. Use up to four times daily as directed., Disp: 1 each, Rfl: 0   cetirizine (ZYRTEC) 10 MG tablet, Take 10 mg by mouth daily., Disp: , Rfl:    cholecalciferol (VITAMIN D) 25 MCG (1000 UNIT) tablet, Take 1 tablet by mouth daily. , Disp: , Rfl:    clopidogrel (PLAVIX) 75 MG tablet, TAKE 1 TABLET BY MOUTH DAILY, Disp: 100 tablet, Rfl: 2   FARXIGA 10 MG TABS tablet, TAKE 1 TABLET BY MOUTH EVERY DAY BEFORE BREAKFAST, Disp: 90 tablet, Rfl: 3   fenofibrate (TRICOR) 145 MG tablet, Take 1 tablet (145 mg total) by mouth daily., Disp: 100 tablet, Rfl: 3   fluticasone (FLONASE) 50 MCG/ACT nasal spray, USE 2 SPRAYS IN BOTH NOSTRILS  DAILY, Disp: 64 g, Rfl: 0   glucose blood (ACCU-CHEK GUIDE) test strip, Use as instructed, Disp: 100 each, Rfl: 12   Insulin Lispro Prot & Lispro (HUMALOG MIX 75/25 KWIKPEN) (75-25) 100 UNIT/ML Kwikpen, Inject 18 Units into the skin in the  morning and at bedtime., Disp: 45 mL, Rfl: 3   Insulin Pen Needle (PEN NEEDLES 31GX5/16") 31G X 8 MM MISC, Use twice a day for insulin injection, E11.9, Disp: 100 each, Rfl: 11   meloxicam (MOBIC) 15 MG tablet, Take 1 tablet (15 mg total) by mouth daily. (Patient not taking: Reported on 11/25/2022), Disp: 90 tablet, Rfl: 3   metFORMIN (GLUCOPHAGE) 1000 MG tablet, TAKE 1 TABLET BY MOUTH TWICE  DAILY WITH A MEAL, Disp: 200 tablet, Rfl: 2   metoprolol succinate (TOPROL-XL) 100 MG 24 hr tablet, Take 1 tablet (100 mg total) by mouth daily., Disp: 100 tablet, Rfl: 3   OVER THE COUNTER MEDICATION, 1 tablet in the morning and at bedtime. [facility] - Restore multivitamin, Disp: , Rfl:    sacubitril-valsartan (ENTRESTO) 97-103 MG, TAKE 1 TABLET BY MOUTH TWICE A DAY, Disp: 180 tablet, Rfl: 1   sildenafil (REVATIO) 20 MG tablet, Take 1 tablet (20 mg total) by mouth daily as needed. DO NOT USE with nitrates, Disp: 10 tablet, Rfl: 0   spironolactone (ALDACTONE) 25 MG tablet, TAKE ONE-HALF TABLET BY MOUTH  DAILY, Disp: 50 tablet, Rfl: 2   Study - ESSENCE - olezarsen 50 mg, 80 mg or placebo SQ injection (PI-Christopher [name]), Inject 80 mg into the skin every 30 (thirty) days. For Investigational Use Only. Injection subcutaneously in protocol approved injection sites (abdomen, thigh or outer area of upper arm) every 4 weeks. Please contact -[facility] for any questions or concerns regarding this medication., Disp: , Rfl:    tamsulosin (FLOMAX) 0.4 MG CAPS capsule, Take 1 capsule (0.4 mg total) by mouth daily., Disp: 100 capsule, Rfl: 3  Current Facility-Administered Medications:    Study - ESSENCE - olezarsen 50 mg, 80 mg or placebo SQ injection (PI-Christopher [name]), 80 mg, Subcutaneous, Q28 days, Christopher [name], Christopher Abu, MD, 80 mg at 11/25/22 1235

## 2023-03-29 ENCOUNTER — Ambulatory Visit: Payer: Medicare Other | Attending: Cardiology

## 2023-03-29 DIAGNOSIS — I5022 Chronic systolic (congestive) heart failure: Secondary | ICD-10-CM | POA: Diagnosis not present

## 2023-03-29 DIAGNOSIS — Z95810 Presence of automatic (implantable) cardiac defibrillator: Secondary | ICD-10-CM | POA: Diagnosis not present

## 2023-03-31 ENCOUNTER — Telehealth: Payer: Self-pay

## 2023-03-31 NOTE — Telephone Encounter (Signed)
Remote ICM transmission received.  Attempted call to patient regarding ICM remote transmission and left detailed message per DPR.  Left ICM phone number and advised to return call for any fluid symptoms or questions. Next ICM remote transmission scheduled 05/03/2023.

## 2023-03-31 NOTE — Progress Notes (Signed)
EPIC Encounter for ICM Monitoring  Patient Name: Christopher [name] is a 72 y.o. male Date: 03/31/2023 Primary Care Physican: Myrlene Broker, MD Primary Cardiologist: [facility] Electrophysiologist: Elberta Fortis 09/25/2022 Weight: 234 lbs 12/04/2022 Weight: 233-235 lbs     Attempted call to patient and unable to reach.  Left detailed message per DPR regarding transmission. Transmission reviewed.    Optivol thoracic impedance suggesting normal fluid levels.     Spironolactone 25 mg take 0.5 tablet (12.5 total) daily   Labs: 11/20/2022 Creatinine 1.22, BUN 19, Potassium 3.9, Sodium 137, GFR 58.92 12/23/2021 Creatinine 1.19, BUN 17, Potassium 4.0, Sodium 142, GFR 65 11/18/2021 Creatinine 1.33, BUN 21, Potassium 3.8, Sodium 138 A complete set of results can be found in Results Review.   Recommendations:  Left voice mail with ICM number and encouraged to call if experiencing any fluid symptoms.   Follow-up plan: ICM clinic phone appointment on 05/03/2023.   91 day device clinic remote transmission 05/17/2023.      EP/Cardiology Office Visits:  06/14/2023 with Dr Rennis Golden.  Has been advised to call the office for EP appointment. Recall 06/15/2022 with Otilio Saber.     Copy of ICM check sent to Dr. Elberta Fortis.  3 month ICM trend: 03/29/2023.    12-14 Month ICM trend:     Karie Soda, RN 03/31/2023 3:18 PM

## 2023-04-02 ENCOUNTER — Encounter: Payer: Self-pay | Admitting: *Deleted

## 2023-04-02 DIAGNOSIS — Z006 Encounter for examination for normal comparison and control in clinical research program: Secondary | ICD-10-CM

## 2023-04-02 NOTE — Research (Signed)
[name] has an appointment with Dr Rennis Golden 06-14-23. Dr Riley Kill would like him to be seen sooner if possible. [name] has completed the Essence research study and needs to follow up with Cardiology. Spoke with Morrie Sheldon who states she will call [name]. She has an Oct 25 appointment at 1000.

## 2023-04-19 ENCOUNTER — Other Ambulatory Visit: Payer: Self-pay | Admitting: *Deleted

## 2023-04-24 ENCOUNTER — Other Ambulatory Visit: Payer: Self-pay | Admitting: Internal Medicine

## 2023-04-24 DIAGNOSIS — E118 Type 2 diabetes mellitus with unspecified complications: Secondary | ICD-10-CM

## 2023-04-26 DIAGNOSIS — H353132 Nonexudative age-related macular degeneration, bilateral, intermediate dry stage: Secondary | ICD-10-CM | POA: Diagnosis not present

## 2023-04-26 LAB — HM DIABETES EYE EXAM

## 2023-04-30 ENCOUNTER — Ambulatory Visit: Payer: Medicare Other | Attending: Internal Medicine | Admitting: Internal Medicine

## 2023-04-30 VITALS — BP 120/64 | HR 67 | Ht 70.0 in | Wt 229.8 lb

## 2023-04-30 DIAGNOSIS — I10 Essential (primary) hypertension: Secondary | ICD-10-CM | POA: Diagnosis not present

## 2023-04-30 DIAGNOSIS — I5022 Chronic systolic (congestive) heart failure: Secondary | ICD-10-CM | POA: Diagnosis not present

## 2023-04-30 DIAGNOSIS — I255 Ischemic cardiomyopathy: Secondary | ICD-10-CM | POA: Diagnosis not present

## 2023-04-30 DIAGNOSIS — I340 Nonrheumatic mitral (valve) insufficiency: Secondary | ICD-10-CM

## 2023-04-30 DIAGNOSIS — Z9889 Other specified postprocedural states: Secondary | ICD-10-CM

## 2023-04-30 NOTE — Progress Notes (Unsigned)
OFFICE NOTE  Chief Complaint:  Follow-up  Primary Care Physician: Christopher Broker, MD  HPI:  Christopher [name] is a 72 y.o. 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  Subsequently he had cardiac arrest and had a single-chamber ICD placed in 2018 due to nonischemic cardiomyopathy.  He has had a history of atrial fibrillation, hypertension dyslipidemia and sleep apnea.  He is followed by Dr. Johney Frame for his ICD.  At one point his LVEF had improved up to 55 to 60% on medical therapy however this past summer he had a pontine stroke associated diplopia.  Repeat echocardiogram showed a newly reduced EF 30 to 35% with anterior apical and inferoapical wall motion abnormality suggestive of Takatsubo versus LAD territory ischemia.  Given recent stroke ischemic evaluation was not performed.  He had not been complaining of chest pain.  Subsequently followed up with Azalee Course, PA-C, who has been increasing his heart failure medicine regimen, titrating his Entresto up to the highest dose and adding Farxiga in addition to spironolactone, beta-blocker and other heart failure therapies.  Repeat echocardiogram was performed in November which showed improvement in LVEF up to 40 to 45% however his EF is not normalized.  He denies any anginal symptoms but does get some mild shortness of breath with exertion.  12/29/2021  [name] returns today for follow-up.  Please report his LVEF has improved further slightly up to 45 to 50%.  The mitral valve gradients suggest some mild narrowing of the mitral valve annuloplasty without severe stenosis.  He did enroll in a clinical research trial for elevated triglycerides.  He was given a coronary CT scan as part of that study which did result in being abnormal suggesting a positive FFR and possible occlusion proximal to the known  RCA stent.  Despite this finding he is completely asymptomatic.  He denies chest pain or worsening shortness of breath.  Since this study was not performed for symptoms of angina, I am less inclined to need to follow this up.  He is also asking today about Viagra.  06/25/2022  [name] is seen today in follow-up.  He is maintained in the clinical research trial to lower triglycerides.  He seems to have had no issues with that.  Continues to have remote defibrillator checks which show no issues.  In June showed stable LVEF at 45 to 50% with a stable mitral valve gradient.  He did have a CT with FFR at that time as part of his clinical research trial.  This suggested some obstructive CAD proximal to the RCA stent however he has been asymptomatic.  Since this was only performed as part of the clinical research trial and he was asymptomatic we did not pursue any therapy.  It was felt that the FFR may be spurious because of the stent.  He reports he still does not have any anginal symptoms.  PMHx:  Past Medical History:  Diagnosis Date   Arthritis    Atrial fibrillation (HCC)    post op, intol of anticoag   Cardiac arrest [facility])    a. s/p MDT single chamber ICD; 11-10-18- pt denies having a heart attack   Cataract    CHF (congestive heart failure) (HCC)    Chronic kidney disease    kidney stone   Coronary artery disease    a. 2/7  Cath: LM nl, LAD min irregs, LCX min irregs, RI 40, RCA 37m, EF 55-60% basal to mid inf HK, 3-4+ MR;  b. 08/25/2012 PCI of RCA with 4.0x15 Vision BMS   Diabetes mellitus without complication (HCC)    GERD (gastroesophageal reflux disease)    hx   Heart murmur    Hyperlipidemia    on statin   Hypertension    Myocardial infarction (HCC)    PONV (postoperative nausea and vomiting)    S/P mitral valve repair 09/27/2012   Complex valvuloplasty including triangular resection of flail posterior leaflet with 30 mm Sorin Memo 3D ring annuloplasty via right mini thoracotomy  approach   seasonal allergies 09/27/2008   Severe mitral regurgitation    a. Mitral valve prolapse with flail segment of posterior leaflet and severe MR by TEE, remote h/o bacterial endocarditis    Sleep apnea    NPSG 01/21/06- AHI 40.7/hr cpap   Stroke [facility])    summer 2022- one messed up vision, the other balance   Subacute bacterial endocarditis 03/22/2008   Strep viridans   Ventricular fibrillation (HCC) 11/07/2019   appropriate shock (36J) for VF delivered    Past Surgical History:  Procedure Laterality Date   AMPUTATION  07/28/2012   Procedure: AMPUTATION DIGIT;  Surgeon: [name], MD;  Location: [facility] ORS;  Service: Orthopedics;  Laterality: Right;  2nd toe   CARDIAC CATHETERIZATION     COLONOSCOPY     EP IMPLANTABLE DEVICE N/A 05/01/2016   Procedure: Loop Recorder Removal;  Surgeon: [name] Range, MD;  Location: [facility] INVASIVE CV LAB;  Service: Cardiovascular;  Laterality: N/A;   FOOT SURGERY     BILATERAL TOES   ICD IMPLANT N/A 05/07/2017    Medtronic Visia AF MRI VR SureScan implanted by Dr Elberta Fortis following VF arrest   Implantable loop recorder placement  03/31/2013   MDT Linq implanted by Dr Johney Frame to evaluate for further afib   INTRAOPERATIVE TRANSESOPHAGEAL ECHOCARDIOGRAM N/A 09/27/2012   Procedure: INTRAOPERATIVE TRANSESOPHAGEAL ECHOCARDIOGRAM;  Surgeon: [name], MD;  Location: [facility] OR;  Service: Open Heart Surgery;  Laterality: N/A;   LEFT AND RIGHT HEART CATHETERIZATION WITH CORONARY ANGIOGRAM N/A 08/12/2012   Procedure: LEFT AND RIGHT HEART CATHETERIZATION WITH CORONARY ANGIOGRAM;  Surgeon: [name], MD;  Location: [facility] CATH LAB;  Service: Cardiovascular;  Laterality: N/A;   LEFT HEART CATH AND CORONARY ANGIOGRAPHY N/A 05/06/2017   Procedure: LEFT HEART CATH AND CORONARY ANGIOGRAPHY;  Surgeon: Marykay Lex, MD;  Location: [facility] INVASIVE CV LAB;  Service: Cardiovascular;  Laterality: N/A;   LOOP RECORDER IMPLANT N/A 03/31/2013   Procedure: LOOP RECORDER  IMPLANT;  Surgeon: Gardiner Rhyme, MD;  Location: [facility] CATH LAB;  Service: Cardiovascular;  Laterality: N/A;   MITRAL VALVE REPAIR Right 09/27/2012   Procedure: MINIMALLY INVASIVE MITRAL VALVE REPAIR (MVR);  Surgeon: [name], MD;  Location: [facility] OR;  Service: Open Heart Surgery;  Laterality: Right;  Ultrasound guided   PERCUTANEOUS CORONARY STENT INTERVENTION (PCI-S) N/A 08/25/2012   Procedure: PERCUTANEOUS CORONARY STENT INTERVENTION (PCI-S);  Surgeon: Tonny Bollman, MD;  Location: [facility] CATH LAB;  Service: Cardiovascular;  Laterality: N/A;   POLYPECTOMY     SEPTOPLASTY     Dr. [name]   TEE WITHOUT CARDIOVERSION N/A 08/12/2012   Procedure: TRANSESOPHAGEAL ECHOCARDIOGRAM (TEE);  Surgeon: [name], MD;  Location: [facility] ENDOSCOPY;  Service: Cardiovascular;  Laterality: N/A;   TONSILLECTOMY     UVULOPALATOPHARYNGOPLASTY      FAMHx:  Family History  Problem Relation Age of Onset   Diabetes Mother    Stroke Mother    Heart disease Father    Colon cancer Father 57   Diabetes Father    Renal cancer Father    Sudden death Brother    Heart attack Brother    Esophageal cancer Neg Hx    Rectal cancer Neg Hx    Stomach cancer Neg Hx    Colon polyps Neg Hx     SOCHx:   reports that he quit smoking about 44 years ago. His smoking use included cigarettes. He started smoking about 49 years ago. He has a 12.5 pack-year smoking history. He has never used smokeless tobacco. He reports current alcohol use. He reports that he does not use drugs.  ALLERGIES:  Allergies  Allergen Reactions   Codeine Other (See Comments)    Causes bad constipation   Dust Mite Extract Cough   Pollen Extract Cough    ROS: Pertinent items noted in HPI and remainder of comprehensive ROS otherwise negative.  HOME MEDS: Current Outpatient Medications on File Prior to Visit  Medication Sig Dispense Refill   ACCU-CHEK GUIDE test strip USE AS INSTRUCTED 100 strip 12   Accu-Chek Softclix Lancets lancets Use  as instructed 100 each 12   atorvastatin (LIPITOR) 40 MG tablet Take 1 tablet (40 mg total) by mouth daily. 100 tablet 3   blood glucose meter kit and supplies KIT Dispense based on patient and insurance preference. Use up to four times daily as directed. 1 each 0   cetirizine (ZYRTEC) 10 MG tablet Take 10 mg by mouth daily.     cholecalciferol (VITAMIN D) 25 MCG (1000 UNIT) tablet Take 1 tablet by mouth daily.      FARXIGA 10 MG TABS tablet TAKE 1 TABLET BY MOUTH EVERY DAY BEFORE BREAKFAST 90 tablet 3   fenofibrate (TRICOR) 145 MG tablet Take 1 tablet (145 mg total) by mouth daily. 100 tablet 3   fluticasone (FLONASE) 50 MCG/ACT nasal spray USE 2 SPRAYS IN BOTH NOSTRILS  DAILY 64 g 0   Insulin Lispro Prot & Lispro (HUMALOG MIX 75/25 KWIKPEN) (75-25) 100 UNIT/ML Kwikpen Inject 18 Units into the skin in the morning and at bedtime. 45 mL 3   Insulin Pen Needle (PEN NEEDLES 31GX5/16") 31G X 8 MM MISC Use twice a day for insulin injection, E11.9 100 each 11   metFORMIN (GLUCOPHAGE) 1000 MG tablet TAKE 1 TABLET BY MOUTH TWICE  DAILY WITH A MEAL 200 tablet 2   metoprolol succinate (TOPROL-XL) 100 MG 24 hr tablet Take 1 tablet (100 mg total) by mouth daily. 100 tablet 3   OVER THE COUNTER MEDICATION 1 tablet in the morning and at bedtime. [facility] - Restore multivitamin     sacubitril-valsartan (ENTRESTO) 97-103 MG TAKE 1 TABLET BY MOUTH TWICE A DAY 180 tablet 1   spironolactone (ALDACTONE) 25 MG tablet TAKE ONE-HALF TABLET BY MOUTH  DAILY 50 tablet 2   tamsulosin (FLOMAX) 0.4 MG CAPS capsule Take 1 capsule (0.4 mg total) by mouth daily. 100 capsule 3   acetaminophen (TYLENOL) 500 MG tablet Take 500 mg by mouth every 6 (six) hours as needed for mild pain or headache.  (Patient not taking: Reported on 04/30/2023)     clopidogrel (PLAVIX) 75 MG tablet TAKE 1 TABLET BY MOUTH DAILY (Patient not taking: Reported on 04/30/2023) 100 tablet 2   meloxicam (MOBIC) 15 MG tablet Take 1 tablet (15 mg total) by  mouth daily. (Patient not  taking: Reported on 11/25/2022) 90 tablet 3   sildenafil (REVATIO) 20 MG tablet Take 1 tablet (20 mg total) by mouth daily as needed. DO NOT USE with nitrates (Patient not taking: Reported on 04/30/2023) 10 tablet 0   No current facility-administered medications on file prior to visit.    LABS/IMAGING: No results found for this or any previous visit (from the past 48 hour(s)). No results found.  LIPID PANEL:    Component Value Date/Time   CHOL 126 11/20/2022 1425   CHOL 120 12/23/2021 1502   TRIG 75.0 11/20/2022 1425   HDL 50.50 11/20/2022 1425   HDL 38 (L) 12/23/2021 1502   CHOLHDL 2 11/20/2022 1425   VLDL 15.0 11/20/2022 1425   LDLCALC 60 11/20/2022 1425   LDLCALC 60 12/23/2021 1502   LDLDIRECT 52.0 07/18/2019 1454     WEIGHTS: Wt Readings from Last 3 Encounters:  04/30/23 229 lb 12.8 oz (104.2 kg)  12/23/22 234 lb (106.1 kg)  11/20/22 236 lb (107 kg)    VITALS: BP 120/64 (BP Location: Left Arm, Patient Position: Sitting, Cuff Size: Large)   Pulse 67   Ht 5\' 10"  (1.778 m)   Wt 229 lb 12.8 oz (104.2 kg)   SpO2 96%   BMI 32.97 kg/m   EXAM: General appearance: alert and no distress Neck: no carotid bruit, no JVD, and thyroid not enlarged, symmetric, no tenderness/mass/nodules Lungs: clear to auscultation bilaterally Heart: regular rate and rhythm, S1, S2 normal, no murmur, click, rub or gallop Abdomen: soft, non-tender; bowel sounds normal; no masses,  no organomegaly Extremities: extremities normal, atraumatic, no cyanosis or edema Pulses: 2+ and symmetric Skin: Skin color, texture, turgor normal. No rashes or lesions Neurologic: Grossly normal Psych: Pleasant  EKG: Deferred  ASSESSMENT: CAD status post PCI to the RCA in 2014 Coronary CTA suggested possible obstruction proximal to the right coronary artery stent (12/2021)-asymptomatic Cardiac arrest in 2018 due to nonischemic cardiomyopathy status post AICD History of subacute  bacterial endocarditis status post mitral valve repair History of stroke in 02/2021 Hypertension Dyslipidemia OSA on CPAP Erectile dysfunction  PLAN: 1.   Mr. Burgos seems to be doing fairly well.  His EF is been stable at about 45 to 50%.  There was a question on CT performed for research about possible obstruction in the RCA however he has had no symptoms.  He is enrolled in a clinical research trial which she is tolerating well.  Blood pressure is well-controlled.  We are blinded to his lipids.  No changes to his medicines today.  Plan follow-up with me annually or sooner as necessary.  Chrystie Nose, MD, [facility], FACP  [place]  [facility]  Medical Director of the Advanced Lipid Disorders &  Cardiovascular Risk Reduction Clinic Diplomate of the American Board of Clinical Lipidology Attending Cardiologist  Direct Dial: [phone]  Fax: [phone]  Website:  [email or web address] Nicely Taesean Reth 04/30/2023, 10:57 AM

## 2023-04-30 NOTE — Patient Instructions (Signed)
Medication Instructions:  No change  *If you need a refill on your cardiac medications before your next appointment, please call your pharmacy*   Lab Work: None    Testing/Procedures: Echo Your physician has requested that you have an echocardiogram. Echocardiography is a painless test that uses sound waves to create images of your heart. It provides your doctor with information about the size and shape of your heart and how well your heart's chambers and valves are working. This procedure takes approximately one hour. There are no restrictions for this procedure. Please do NOT wear cologne, perfume, aftershave, or lotions (deodorant is allowed). Please arrive 15 minutes prior to your appointment time.    Follow-Up: At [facility], you and your health needs are our priority.  As part of our continuing mission to provide you with exceptional heart care, we have created designated Provider Care Teams.  These Care Teams include your primary Cardiologist (physician) and Advanced Practice Providers (APPs -  Physician Assistants and Nurse Practitioners) who all work together to provide you with the care you need, when you need it.   6 month(s)  Provider:   Chrystie Nose, MD     Other Instructions We canceled the appointment for December

## 2023-05-17 ENCOUNTER — Ambulatory Visit: Payer: Medicare Other

## 2023-05-17 DIAGNOSIS — I5022 Chronic systolic (congestive) heart failure: Secondary | ICD-10-CM

## 2023-05-17 DIAGNOSIS — I255 Ischemic cardiomyopathy: Secondary | ICD-10-CM

## 2023-05-18 ENCOUNTER — Ambulatory Visit: Payer: Medicare Other | Attending: Cardiology

## 2023-05-18 DIAGNOSIS — I5022 Chronic systolic (congestive) heart failure: Secondary | ICD-10-CM | POA: Diagnosis not present

## 2023-05-18 DIAGNOSIS — Z95810 Presence of automatic (implantable) cardiac defibrillator: Secondary | ICD-10-CM

## 2023-05-18 LAB — CUP PACEART REMOTE DEVICE CHECK
Battery Remaining Longevity: 60 mo
Battery Voltage: 2.93 V
Brady Statistic RV Percent Paced: 0.49 %
Date Time Interrogation Session: 20241111001708
HighPow Impedance: 64 Ohm
Implantable Lead Connection Status: 753985
Implantable Lead Implant Date: 20181102
Implantable Lead Location: 753860
Implantable Pulse Generator Implant Date: 20181102
Lead Channel Impedance Value: 475 Ohm
Lead Channel Impedance Value: 513 Ohm
Lead Channel Pacing Threshold Amplitude: 0.75 V
Lead Channel Pacing Threshold Pulse Width: 0.4 ms
Lead Channel Sensing Intrinsic Amplitude: 4.625 mV
Lead Channel Sensing Intrinsic Amplitude: 4.625 mV
Lead Channel Setting Pacing Amplitude: 2 V
Lead Channel Setting Pacing Pulse Width: 0.4 ms
Lead Channel Setting Sensing Sensitivity: 0.3 mV
Zone Setting Status: 755011
Zone Setting Status: 755011

## 2023-05-18 NOTE — Progress Notes (Signed)
EPIC Encounter for ICM Monitoring  Patient Name: Christopher [name] is a 72 y.o. male Date: 05/18/2023 Primary Care Physican: Myrlene Broker, MD Primary Cardiologist: [facility] Electrophysiologist: Elberta Fortis 09/25/2022 Weight: 234 lbs 12/04/2022 Weight: 233-235 lbs 05/18/2023 Weight: 229 lbs     Spoke with patient and heart failure questions reviewed.  Transmission results reviewed.  Pt asymptomatic for fluid accumulation.  Reports feeling well at this time and voices no complaints.  He said he may be drinking too much fluids and discussed limiting fluid intake    Optivol thoracic impedance suggesting possible fluid accumulation starting 11/2.     Spironolactone 25 mg take 0.5 tablet (12.5 total) daily   Labs: 11/20/2022 Creatinine 1.22, BUN 19, Potassium 3.9, Sodium 137, GFR 58.92 12/23/2021 Creatinine 1.19, BUN 17, Potassium 4.0, Sodium 142, GFR 65 11/18/2021 Creatinine 1.33, BUN 21, Potassium 3.8, Sodium 138 A complete set of results can be found in Results Review.   Recommendations:  Recommendation to limit salt intake to 2000 mg daily and fluid intake to 64 oz daily.  Encouraged to call if experiencing any fluid symptoms.   Advised will have EP scheduler to call him for overdue appt.   Follow-up plan: ICM clinic phone appointment on 05/25/2023 to recheck fluid levels.   91 day device clinic remote transmission 08/14/2022.      EP/Cardiology Office Visits:  Recall 10/27/2023 with Dr Rennis Golden.  Message sent to EP scheduler to contact pt for overdue appt.  Last EP visit was 06/2021 with Dr Johney Frame.     Copy of ICM check sent to Dr. Elberta Fortis.  3 month ICM trend: 05/17/2023.    12-14 Month ICM trend:     Karie Soda, RN 05/18/2023 2:49 PM

## 2023-05-25 ENCOUNTER — Ambulatory Visit: Payer: Medicare Other | Attending: Cardiology

## 2023-05-26 NOTE — Progress Notes (Signed)
EPIC Encounter for ICM Monitoring  Patient Name: Christopher [name] is a 72 y.o. male Date: 05/26/2023 Primary Care Physican: Myrlene Broker, MD Primary Cardiologist: [facility] Electrophysiologist: Elberta Fortis 09/25/2022 Weight: 234 lbs 12/04/2022 Weight: 233-235 lbs 05/18/2023 Weight: 229 lbs     Spoke with patient and heart failure questions reviewed.  Transmission results reviewed.  Pt asymptomatic for fluid accumulation.  Reports feeling well at this time and voices no complaints.     Optivol thoracic impedance suggesting fluid levels returned to normal.     Spironolactone 25 mg take 0.5 tablet (12.5 total) daily   Labs: 11/20/2022 Creatinine 1.22, BUN 19, Potassium 3.9, Sodium 137, GFR 58.92 12/23/2021 Creatinine 1.19, BUN 17, Potassium 4.0, Sodium 142, GFR 65 11/18/2021 Creatinine 1.33, BUN 21, Potassium 3.8, Sodium 138 A complete set of results can be found in Results Review.   Recommendations:  Recommendation to limit fluid intake to 64 oz daily.  Encouraged to call if experiencing any fluid symptoms.      Follow-up plan: ICM clinic phone appointment on 06/28/2023.   91 day device clinic remote transmission 08/14/2022.      EP/Cardiology Office Visits:  Recall 10/27/2023 with Dr Rennis Golden.  06/17/2023 with Otilio Saber, PA.     Copy of ICM check sent to Dr. Elberta Fortis.  3 month ICM trend: 05/25/2023.    12-14 Month ICM trend:     Karie Soda, RN 05/26/2023 11:59 AM

## 2023-05-28 ENCOUNTER — Ambulatory Visit: Payer: Medicare Other | Admitting: Internal Medicine

## 2023-05-31 ENCOUNTER — Encounter: Payer: Self-pay | Admitting: Internal Medicine

## 2023-05-31 ENCOUNTER — Ambulatory Visit (HOSPITAL_COMMUNITY): Payer: Medicare Other | Attending: Cardiovascular Disease

## 2023-05-31 ENCOUNTER — Ambulatory Visit (INDEPENDENT_AMBULATORY_CARE_PROVIDER_SITE_OTHER): Payer: Medicare Other | Admitting: Internal Medicine

## 2023-05-31 VITALS — BP 140/80 | HR 69 | Temp 97.7°F | Ht 70.0 in | Wt 227.0 lb

## 2023-05-31 DIAGNOSIS — I69398 Other sequelae of cerebral infarction: Secondary | ICD-10-CM

## 2023-05-31 DIAGNOSIS — I5022 Chronic systolic (congestive) heart failure: Secondary | ICD-10-CM | POA: Diagnosis not present

## 2023-05-31 DIAGNOSIS — Z7984 Long term (current) use of oral hypoglycemic drugs: Secondary | ICD-10-CM

## 2023-05-31 DIAGNOSIS — R42 Dizziness and giddiness: Secondary | ICD-10-CM

## 2023-05-31 DIAGNOSIS — E118 Type 2 diabetes mellitus with unspecified complications: Secondary | ICD-10-CM

## 2023-05-31 DIAGNOSIS — Z794 Long term (current) use of insulin: Secondary | ICD-10-CM

## 2023-05-31 DIAGNOSIS — I472 Ventricular tachycardia, unspecified: Secondary | ICD-10-CM | POA: Diagnosis not present

## 2023-05-31 DIAGNOSIS — I255 Ischemic cardiomyopathy: Secondary | ICD-10-CM | POA: Diagnosis not present

## 2023-05-31 LAB — ECHOCARDIOGRAM COMPLETE
Area-P 1/2: 2.78 cm2
Est EF: 45
Height: 70 in
MV VTI: 1.01 cm2
P 1/2 time: 355 ms
S' Lateral: 4.9 cm
Weight: 3632 [oz_av]

## 2023-05-31 LAB — POCT GLYCOSYLATED HEMOGLOBIN (HGB A1C): HbA1c POC (<> result, manual entry): 5.4 % (ref 4.0–5.6)

## 2023-05-31 NOTE — Progress Notes (Signed)
   Subjective:   Patient ID: Christopher [name], male    DOB: 1950/10/06, 72 y.o.   MRN: [id number]  HPI The patient is a 72 YO man coming in for follow up. HgA1c 5.4. He thinks he may have had another stroke in the last few months. About 1 week of weakness legs and happened in the evening. He was up getting food and made a mess could not get up. Did not check sugar but did check several times while feeling bad and it was normal. Denies low sugars.   Review of Systems  Constitutional: Negative.   HENT: Negative.    Eyes: Negative.   Respiratory:  Negative for cough, chest tightness and shortness of breath.   Cardiovascular:  Negative for chest pain, palpitations and leg swelling.  Gastrointestinal:  Negative for abdominal distention, abdominal pain, constipation, diarrhea, nausea and vomiting.  Musculoskeletal: Negative.   Skin: Negative.   Neurological:  Positive for weakness.  Psychiatric/Behavioral: Negative.      Objective:  Physical Exam Constitutional:      Appearance: He is well-developed.  HENT:     Head: Normocephalic and atraumatic.  Cardiovascular:     Rate and Rhythm: Normal rate and regular rhythm.  Pulmonary:     Effort: Pulmonary effort is normal. No respiratory distress.     Breath sounds: Normal breath sounds. No wheezing or rales.  Abdominal:     General: Bowel sounds are normal. There is no distension.     Palpations: Abdomen is soft.     Tenderness: There is no abdominal tenderness. There is no rebound.  Musculoskeletal:     Cervical back: Normal range of motion.  Skin:    General: Skin is warm and dry.  Neurological:     Mental Status: He is alert and oriented to person, place, and time.     Coordination: Coordination normal.     Vitals:   05/31/23 0816 05/31/23 0823  BP: (!) 140/80 (!) 140/80  Pulse: 69   Temp: 97.7 F (36.5 C)   TempSrc: Oral   SpO2: 98%   Weight: 227 lb (103 kg)   Height: 5\' 10"  (1.778 m)     Assessment & Plan:

## 2023-05-31 NOTE — Assessment & Plan Note (Signed)
He is in doughnut hole and is not planning to buy any more farxiga or entresto until January. He is out of both now. He is still taking spironolactone, metoprolol and encouraged him to contact cardiology to see if they could help with this.

## 2023-05-31 NOTE — Assessment & Plan Note (Signed)
No recent ICD firing. Denies new SOB or chest pain symptoms.

## 2023-05-31 NOTE — Assessment & Plan Note (Signed)
POC HgA1c 5.4 and he denies low blood sugars. Taking metformin 1000 mg BID and humalog 75/25 18 units BID and farxiga 10 mg daily (out until January). Is typically on statin and ACE-I (entresto).

## 2023-05-31 NOTE — Assessment & Plan Note (Signed)
Thinks he had another stroke recently and did not seek care. Encouraged to seek care in future at signs of stroke. He declines imaging today as this would not impact care with stroke symptoms some weeks/months ago (unsure of exact timeline).

## 2023-06-10 ENCOUNTER — Other Ambulatory Visit: Payer: Self-pay | Admitting: Internal Medicine

## 2023-06-10 NOTE — Progress Notes (Signed)
Remote ICD transmission.   

## 2023-06-14 ENCOUNTER — Ambulatory Visit: Payer: Medicare Other | Admitting: Internal Medicine

## 2023-06-17 ENCOUNTER — Encounter: Payer: Self-pay | Admitting: Student

## 2023-06-17 ENCOUNTER — Ambulatory Visit: Payer: Medicare Other | Attending: Student | Admitting: Student

## 2023-06-17 VITALS — BP 146/78 | HR 70 | Ht 70.0 in | Wt 230.0 lb

## 2023-06-17 DIAGNOSIS — Z95810 Presence of automatic (implantable) cardiac defibrillator: Secondary | ICD-10-CM

## 2023-06-17 DIAGNOSIS — I255 Ischemic cardiomyopathy: Secondary | ICD-10-CM

## 2023-06-17 DIAGNOSIS — E785 Hyperlipidemia, unspecified: Secondary | ICD-10-CM | POA: Diagnosis not present

## 2023-06-17 DIAGNOSIS — I10 Essential (primary) hypertension: Secondary | ICD-10-CM | POA: Diagnosis not present

## 2023-06-17 DIAGNOSIS — I428 Other cardiomyopathies: Secondary | ICD-10-CM

## 2023-06-17 DIAGNOSIS — I5022 Chronic systolic (congestive) heart failure: Secondary | ICD-10-CM

## 2023-06-17 LAB — CUP PACEART INCLINIC DEVICE CHECK
Battery Remaining Longevity: 48 mo
Battery Voltage: 2.99 V
Brady Statistic RV Percent Paced: 0.38 %
Date Time Interrogation Session: 20241212122318
HighPow Impedance: 73 Ohm
Implantable Lead Connection Status: 753985
Implantable Lead Implant Date: 20181102
Implantable Lead Location: 753860
Implantable Pulse Generator Implant Date: 20181102
Lead Channel Impedance Value: 399 Ohm
Lead Channel Impedance Value: 513 Ohm
Lead Channel Pacing Threshold Amplitude: 0.5 V
Lead Channel Pacing Threshold Pulse Width: 0.4 ms
Lead Channel Sensing Intrinsic Amplitude: 4.75 mV
Lead Channel Sensing Intrinsic Amplitude: 5.375 mV
Lead Channel Setting Pacing Amplitude: 2 V
Lead Channel Setting Pacing Pulse Width: 0.4 ms
Lead Channel Setting Sensing Sensitivity: 0.3 mV
Zone Setting Status: 755011
Zone Setting Status: 755011

## 2023-06-17 NOTE — Patient Instructions (Signed)
Medication Instructions:  Your physician recommends that you continue on your current medications as directed. Please refer to the Current Medication list given to you today.  *If you need a refill on your cardiac medications before your next appointment, please call your pharmacy*  Lab Work: BMET, CBC--TODAY If you have labs (blood work) drawn today and your tests are completely normal, you will receive your results only by: MyChart Message (if you have MyChart) OR A paper copy in the mail If you have any lab test that is abnormal or we need to change your treatment, we will call you to review the results.   Follow-Up: At [facility], you and your health needs are our priority.  As part of our continuing mission to provide you with exceptional heart care, we have created designated Provider Care Teams.  These Care Teams include your primary Cardiologist (physician) and Advanced Practice Providers (APPs -  Physician Assistants and Nurse Practitioners) who all work together to provide you with the care you need, when you need it.  Your next appointment:   1 year(s)  Provider:   Loman Brooklyn, MD

## 2023-06-17 NOTE — Progress Notes (Signed)
  Electrophysiology Office Note:   ID:  Barett, Sobrino Jun 28, 1951, MRN [id number]  Primary Cardiologist: Chrystie Nose, MD Electrophysiologist: Regan Lemming, MD      History of Present Illness:   Christopher [name] is a 72 y.o. 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   Since last being seen in our clinic the patient reports doing well from a cardiac perspective. No new complaints today. Is in donut hole and currently off [place].  Denies undue SOB, edema, or chest pain.   Review of systems complete and found to be negative unless listed in HPI.   EP Information / Studies Reviewed:    EKG is not ordered today. EKG from 04/30/2023 reviewed which showed NSR with 1st degree AV block and bifocal PVCs       ICD Interrogation-  reviewed in detail today,  See PACEART report.  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 Severe proximal mixed stenosis of the RCA which is likely occluded, just prior to the stented mid-vessel. There are left to right collaterals, CADRADS = 4/5. The RCA could not be modeled due to prior stent, therefore, FFR was not performed. There is also moderate proximal mixed stenosis of a high OM branch. 2. Coronary artery calcium scoring was note performed due to the presence of a coronary stent. 3. Normal coronary origin with right dominance. 4. Comparison to a prior coronary CT dated 12/05/2021, demonstrates progression of CAD in the RCA, which is now likely occluded or subtotally occluded upstream previously placed stent, noting left to right collaterals. Prior FFR suggested a value of 0.66 in the distal RCA, consistent with severe flow-limitation, however, FFR is not validated in stented  vessels. 5. Continued aggressive medical therapy is recommended unless the patient is having anginal symptoms.  Physical Exam:   VS:  There were no vitals taken for this visit.   Wt Readings from Last 3 Encounters:  05/31/23 227 lb (103 kg)  04/30/23 229 lb 12.8 oz (104.2 kg)  12/23/22 234 lb (106.1 kg)     GEN: Well nourished, well developed in no acute distress NECK: No JVD; No carotid bruits CARDIAC: Regular rate and rhythm, no murmurs, rubs, gallops RESPIRATORY:  Clear to auscultation without rales, wheezing or rhonchi  ABDOMEN: Soft, non-tender, non-distended EXTREMITIES:  No edema; No deformity   ASSESSMENT AND PLAN:    Chronic systolic CHF  s/p Medtronic single chamber ICD  euvolemic today Stable on an appropriate medical regimen Normal ICD function See Pace Art report No changes today  H/o VF No further  CAD Denies s/s ischemia  H/o MVR Stable last echo  Prior stroke Continue to follow remotes for AF.   HTN Stable on current regimen   Disposition:   Follow up with Dr. Elberta Fortis in 12 months   Signed, Graciella Freer, PA-C

## 2023-06-18 LAB — BASIC METABOLIC PANEL
BUN/Creatinine Ratio: 14 (ref 10–24)
BUN: 20 mg/dL (ref 8–27)
CO2: 25 mmol/L (ref 20–29)
Calcium: 9.7 mg/dL (ref 8.6–10.2)
Chloride: 103 mmol/L (ref 96–106)
Creatinine, Ser: 1.43 mg/dL — ABNORMAL HIGH (ref 0.76–1.27)
Glucose: 113 mg/dL — ABNORMAL HIGH (ref 70–99)
Potassium: 4.3 mmol/L (ref 3.5–5.2)
Sodium: 143 mmol/L (ref 134–144)
eGFR: 52 mL/min/{1.73_m2} — ABNORMAL LOW (ref >59)

## 2023-06-18 LAB — CBC
Hematocrit: 42.7 % (ref 37.5–51.0)
Hemoglobin: 14.1 g/dL (ref 13.0–17.7)
MCH: 29.6 pg (ref 26.6–33.0)
MCHC: 33 g/dL (ref 31.5–35.7)
MCV: 90 fL (ref 79–97)
Platelets: 168 10*3/uL (ref 150–450)
RBC: 4.77 x10E6/uL (ref 4.14–5.80)
RDW: 13.5 % (ref 11.6–15.4)
WBC: 6 10*3/uL (ref 3.4–10.8)

## 2023-06-28 ENCOUNTER — Ambulatory Visit: Payer: Medicare Other | Attending: Cardiology

## 2023-06-28 DIAGNOSIS — I5022 Chronic systolic (congestive) heart failure: Secondary | ICD-10-CM | POA: Diagnosis not present

## 2023-06-28 DIAGNOSIS — Z95810 Presence of automatic (implantable) cardiac defibrillator: Secondary | ICD-10-CM

## 2023-07-02 NOTE — Progress Notes (Signed)
EPIC Encounter for ICM Monitoring  Patient Name: Christopher [name] is a 72 y.o. male Date: 07/02/2023 Primary Care Physican: Myrlene Broker, MD Primary Cardiologist: [facility] Electrophysiologist: Elberta Fortis 09/25/2022 Weight: 234 lbs 12/04/2022 Weight: 233-235 lbs 05/18/2023 Weight: 229 lbs 07/02/2023 Weight: 230 lbs     Spoke with patient and heart failure questions reviewed.  Transmission results reviewed.  Pt asymptomatic for fluid accumulation.  Reports feeling well at this time and voices no complaints.     Optivol thoracic impedance suggesting normal fluid levels within the last month.     Spironolactone 25 mg take 0.5 tablet (12.5 total) daily   Labs: 06/17/2023 Creatinine 1.43, BUN 20, Potassium 4.3, Sodium 143, GFR 52 A complete set of results can be found in Results Review.   Recommendations:   Encouraged to call if experiencing any fluid symptoms.      Follow-up plan: ICM clinic phone appointment on 08/02/2023.   91 day device clinic remote transmission 08/16/2023.      EP/Cardiology Office Visits:  Recall 10/27/2023 with Dr Rennis Golden.  Recall 06/11/2024 with Dr Elberta Fortis.     Copy of ICM check sent to Dr. Elberta Fortis.  3 month ICM trend: 06/28/2023.    12-14 Month ICM trend:     Karie Soda, RN 07/02/2023 3:55 PM

## 2023-08-02 ENCOUNTER — Ambulatory Visit: Payer: Medicare Other | Attending: Cardiology

## 2023-08-02 DIAGNOSIS — Z95810 Presence of automatic (implantable) cardiac defibrillator: Secondary | ICD-10-CM

## 2023-08-02 DIAGNOSIS — I5022 Chronic systolic (congestive) heart failure: Secondary | ICD-10-CM

## 2023-08-06 NOTE — Progress Notes (Signed)
EPIC Encounter for ICM Monitoring  Patient Name: Christopher [name] is a 73 y.o. male Date: 08/06/2023 Primary Care Physican: Myrlene Broker, MD Primary Cardiologist: [facility] Electrophysiologist: Elberta Fortis 09/25/2022 Weight: 234 lbs 12/04/2022 Weight: 233-235 lbs 05/18/2023 Weight: 229 lbs 07/02/2023 Weight: 230 lbs     Transmission results reviewed.       Optivol thoracic impedance suggesting normal fluid levels within the last month.     Spironolactone 25 mg take 0.5 tablet (12.5 total) daily   Labs: 06/17/2023 Creatinine 1.43, BUN 20, Potassium 4.3, Sodium 143, GFR 52 A complete set of results can be found in Results Review.   Recommendations:  No changes      Follow-up plan: ICM clinic phone appointment on 09/06/2023.   91 day device clinic remote transmission 08/16/2023.      EP/Cardiology Office Visits:  Recall 10/27/2023 with Dr Rennis Golden.  Recall 06/11/2024 with Dr Elberta Fortis.     Copy of ICM check sent to Dr. Elberta Fortis.  3 month ICM trend: 08/02/2023.    12-14 Month ICM trend:     Karie Soda, RN 08/06/2023 1:32 PM

## 2023-08-10 ENCOUNTER — Encounter (HOSPITAL_COMMUNITY): Payer: Self-pay

## 2023-08-10 ENCOUNTER — Inpatient Hospital Stay (HOSPITAL_COMMUNITY)
Admission: EM | Admit: 2023-08-10 | Discharge: 2023-08-13 | DRG: 286 | Disposition: A | Discharge status: Home / Self Care | Payer: Medicare Other | Attending: Internal Medicine | Admitting: Internal Medicine

## 2023-08-10 ENCOUNTER — Emergency Department (HOSPITAL_COMMUNITY): Payer: Medicare Other

## 2023-08-10 ENCOUNTER — Other Ambulatory Visit: Payer: Self-pay

## 2023-08-10 DIAGNOSIS — Z794 Long term (current) use of insulin: Secondary | ICD-10-CM | POA: Diagnosis not present

## 2023-08-10 DIAGNOSIS — Z885 Allergy status to narcotic agent status: Secondary | ICD-10-CM

## 2023-08-10 DIAGNOSIS — Z8249 Family history of ischemic heart disease and other diseases of the circulatory system: Secondary | ICD-10-CM

## 2023-08-10 DIAGNOSIS — Z7982 Long term (current) use of aspirin: Secondary | ICD-10-CM

## 2023-08-10 DIAGNOSIS — I499 Cardiac arrhythmia, unspecified: Secondary | ICD-10-CM | POA: Diagnosis not present

## 2023-08-10 DIAGNOSIS — E1122 Type 2 diabetes mellitus with diabetic chronic kidney disease: Secondary | ICD-10-CM | POA: Diagnosis not present

## 2023-08-10 DIAGNOSIS — E781 Pure hyperglyceridemia: Secondary | ICD-10-CM | POA: Diagnosis present

## 2023-08-10 DIAGNOSIS — Z955 Presence of coronary angioplasty implant and graft: Secondary | ICD-10-CM | POA: Diagnosis not present

## 2023-08-10 DIAGNOSIS — I4510 Unspecified right bundle-branch block: Secondary | ICD-10-CM | POA: Diagnosis present

## 2023-08-10 DIAGNOSIS — Z7902 Long term (current) use of antithrombotics/antiplatelets: Secondary | ICD-10-CM

## 2023-08-10 DIAGNOSIS — N1831 Chronic kidney disease, stage 3a: Secondary | ICD-10-CM | POA: Diagnosis present

## 2023-08-10 DIAGNOSIS — I5023 Acute on chronic systolic (congestive) heart failure: Secondary | ICD-10-CM | POA: Diagnosis not present

## 2023-08-10 DIAGNOSIS — Z91048 Other nonmedicinal substance allergy status: Secondary | ICD-10-CM

## 2023-08-10 DIAGNOSIS — Z91119 Patient's noncompliance with dietary regimen due to unspecified reason: Secondary | ICD-10-CM

## 2023-08-10 DIAGNOSIS — I10 Essential (primary) hypertension: Secondary | ICD-10-CM | POA: Diagnosis not present

## 2023-08-10 DIAGNOSIS — Z8679 Personal history of other diseases of the circulatory system: Secondary | ICD-10-CM

## 2023-08-10 DIAGNOSIS — I2510 Atherosclerotic heart disease of native coronary artery without angina pectoris: Secondary | ICD-10-CM | POA: Diagnosis not present

## 2023-08-10 DIAGNOSIS — I493 Ventricular premature depolarization: Secondary | ICD-10-CM | POA: Diagnosis present

## 2023-08-10 DIAGNOSIS — R609 Edema, unspecified: Secondary | ICD-10-CM | POA: Diagnosis not present

## 2023-08-10 DIAGNOSIS — N179 Acute kidney failure, unspecified: Secondary | ICD-10-CM | POA: Diagnosis not present

## 2023-08-10 DIAGNOSIS — Z800 Family history of malignant neoplasm of digestive organs: Secondary | ICD-10-CM

## 2023-08-10 DIAGNOSIS — I252 Old myocardial infarction: Secondary | ICD-10-CM

## 2023-08-10 DIAGNOSIS — I130 Hypertensive heart and chronic kidney disease with heart failure and stage 1 through stage 4 chronic kidney disease, or unspecified chronic kidney disease: Principal | ICD-10-CM | POA: Diagnosis present

## 2023-08-10 DIAGNOSIS — G4733 Obstructive sleep apnea (adult) (pediatric): Secondary | ICD-10-CM

## 2023-08-10 DIAGNOSIS — I480 Paroxysmal atrial fibrillation: Secondary | ICD-10-CM | POA: Diagnosis present

## 2023-08-10 DIAGNOSIS — E118 Type 2 diabetes mellitus with unspecified complications: Secondary | ICD-10-CM | POA: Diagnosis present

## 2023-08-10 DIAGNOSIS — Z8051 Family history of malignant neoplasm of kidney: Secondary | ICD-10-CM

## 2023-08-10 DIAGNOSIS — R0602 Shortness of breath: Secondary | ICD-10-CM | POA: Diagnosis present

## 2023-08-10 DIAGNOSIS — F1090 Alcohol use, unspecified, uncomplicated: Secondary | ICD-10-CM | POA: Diagnosis present

## 2023-08-10 DIAGNOSIS — I110 Hypertensive heart disease with heart failure: Secondary | ICD-10-CM | POA: Diagnosis not present

## 2023-08-10 DIAGNOSIS — I440 Atrioventricular block, first degree: Secondary | ICD-10-CM | POA: Diagnosis present

## 2023-08-10 DIAGNOSIS — Z823 Family history of stroke: Secondary | ICD-10-CM

## 2023-08-10 DIAGNOSIS — I5041 Acute combined systolic (congestive) and diastolic (congestive) heart failure: Secondary | ICD-10-CM | POA: Diagnosis not present

## 2023-08-10 DIAGNOSIS — Z87891 Personal history of nicotine dependence: Secondary | ICD-10-CM

## 2023-08-10 DIAGNOSIS — Z7984 Long term (current) use of oral hypoglycemic drugs: Secondary | ICD-10-CM | POA: Diagnosis not present

## 2023-08-10 DIAGNOSIS — E669 Obesity, unspecified: Secondary | ICD-10-CM | POA: Diagnosis present

## 2023-08-10 DIAGNOSIS — I428 Other cardiomyopathies: Secondary | ICD-10-CM | POA: Diagnosis not present

## 2023-08-10 DIAGNOSIS — Z789 Other specified health status: Secondary | ICD-10-CM | POA: Diagnosis not present

## 2023-08-10 DIAGNOSIS — Z89421 Acquired absence of other right toe(s): Secondary | ICD-10-CM

## 2023-08-10 DIAGNOSIS — Z95810 Presence of automatic (implantable) cardiac defibrillator: Secondary | ICD-10-CM

## 2023-08-10 DIAGNOSIS — Z833 Family history of diabetes mellitus: Secondary | ICD-10-CM | POA: Diagnosis not present

## 2023-08-10 DIAGNOSIS — E876 Hypokalemia: Secondary | ICD-10-CM | POA: Diagnosis not present

## 2023-08-10 DIAGNOSIS — I509 Heart failure, unspecified: Principal | ICD-10-CM

## 2023-08-10 DIAGNOSIS — Z7901 Long term (current) use of anticoagulants: Secondary | ICD-10-CM

## 2023-08-10 DIAGNOSIS — I5021 Acute systolic (congestive) heart failure: Secondary | ICD-10-CM | POA: Diagnosis not present

## 2023-08-10 DIAGNOSIS — Z8673 Personal history of transient ischemic attack (TIA), and cerebral infarction without residual deficits: Secondary | ICD-10-CM

## 2023-08-10 DIAGNOSIS — Z79899 Other long term (current) drug therapy: Secondary | ICD-10-CM | POA: Diagnosis not present

## 2023-08-10 DIAGNOSIS — I5022 Chronic systolic (congestive) heart failure: Secondary | ICD-10-CM | POA: Diagnosis present

## 2023-08-10 DIAGNOSIS — J811 Chronic pulmonary edema: Secondary | ICD-10-CM | POA: Diagnosis not present

## 2023-08-10 DIAGNOSIS — K219 Gastro-esophageal reflux disease without esophagitis: Secondary | ICD-10-CM | POA: Diagnosis present

## 2023-08-10 DIAGNOSIS — Z6833 Body mass index (BMI) 33.0-33.9, adult: Secondary | ICD-10-CM

## 2023-08-10 LAB — CBC
HCT: 45.6 % (ref 39.0–52.0)
Hemoglobin: 13.6 g/dL (ref 13.0–17.0)
MCH: 30 pg (ref 26.0–34.0)
MCHC: 29.8 g/dL — ABNORMAL LOW (ref 30.0–36.0)
MCV: 100.4 fL — ABNORMAL HIGH (ref 80.0–100.0)
Platelets: 171 10*3/uL (ref 150–400)
RBC: 4.54 MIL/uL (ref 4.22–5.81)
RDW: 14.9 % (ref 11.5–15.5)
WBC: 7.5 10*3/uL (ref 4.0–10.5)
nRBC: 0 % (ref 0.0–0.2)

## 2023-08-10 LAB — TROPONIN I (HIGH SENSITIVITY): Troponin I (High Sensitivity): 12 ng/L (ref <18)

## 2023-08-10 LAB — BASIC METABOLIC PANEL
Anion gap: 11 (ref 5–15)
BUN: 19 mg/dL (ref 8–23)
CO2: 19 mmol/L — ABNORMAL LOW (ref 22–32)
Calcium: 9 mg/dL (ref 8.9–10.3)
Chloride: 106 mmol/L (ref 98–111)
Creatinine, Ser: 1.55 mg/dL — ABNORMAL HIGH (ref 0.61–1.24)
GFR, Estimated: 47 mL/min — ABNORMAL LOW (ref >60)
Glucose, Bld: 114 mg/dL — ABNORMAL HIGH (ref 70–99)
Potassium: 3.9 mmol/L (ref 3.5–5.1)
Sodium: 136 mmol/L (ref 135–145)

## 2023-08-10 MED ORDER — ALBUTEROL SULFATE HFA 108 (90 BASE) MCG/ACT IN AERS: 2.0000 | INHALATION_SPRAY | RESPIRATORY_TRACT | Status: DC | PRN

## 2023-08-10 NOTE — ED Provider Triage Note (Signed)
 Emergency Medicine Provider Triage Evaluation Note  Christopher [name] , a 73 y.o. male  was evaluated in triage.  Pt complains of shortness of breath that has been on and off for couple weeks, but worse this week.  Also reports more swelling in his legs than normal.  Denies any chest pain or shortness of breath.  Denies any cough, fever or chills.  Review of Systems  Positive: As above Negative: As above  Physical Exam  BP (!) 160/96   Pulse 81   Temp 97.7 F (36.5 C) (Oral)   Resp 17   Ht 5' 10 (1.778 m)   Wt 104.3 kg   SpO2 100%   BMI 33.00 kg/m  Gen:   Awake, no distress   Resp:  Normal effort  MSK:   Moves extremities without difficulty    Medical Decision Making  Medically screening exam initiated at 10:11 PM.  Appropriate orders placed.  Christopher [name] was informed that the remainder of the evaluation will be completed by another provider, this initial triage assessment does not replace that evaluation, and the importance of remaining in the ED until their evaluation is complete.     Christopher Palma, PA-C 08/10/23 2214

## 2023-08-10 NOTE — ED Triage Notes (Signed)
Pt BIB [facility] for SHOB x1 week. H/x chf.  Pt 97% 4L o2  155/88 74HR 18 rr

## 2023-08-11 DIAGNOSIS — Z91119 Patient's noncompliance with dietary regimen due to unspecified reason: Secondary | ICD-10-CM | POA: Diagnosis not present

## 2023-08-11 DIAGNOSIS — Z833 Family history of diabetes mellitus: Secondary | ICD-10-CM | POA: Diagnosis not present

## 2023-08-11 DIAGNOSIS — G4733 Obstructive sleep apnea (adult) (pediatric): Secondary | ICD-10-CM | POA: Diagnosis not present

## 2023-08-11 DIAGNOSIS — Z7901 Long term (current) use of anticoagulants: Secondary | ICD-10-CM | POA: Diagnosis not present

## 2023-08-11 DIAGNOSIS — I10 Essential (primary) hypertension: Secondary | ICD-10-CM | POA: Diagnosis not present

## 2023-08-11 DIAGNOSIS — Z789 Other specified health status: Secondary | ICD-10-CM | POA: Diagnosis not present

## 2023-08-11 DIAGNOSIS — Z87891 Personal history of nicotine dependence: Secondary | ICD-10-CM | POA: Diagnosis not present

## 2023-08-11 DIAGNOSIS — Z794 Long term (current) use of insulin: Secondary | ICD-10-CM | POA: Diagnosis not present

## 2023-08-11 DIAGNOSIS — I5041 Acute combined systolic (congestive) and diastolic (congestive) heart failure: Secondary | ICD-10-CM | POA: Diagnosis not present

## 2023-08-11 DIAGNOSIS — Z7982 Long term (current) use of aspirin: Secondary | ICD-10-CM | POA: Diagnosis not present

## 2023-08-11 DIAGNOSIS — E669 Obesity, unspecified: Secondary | ICD-10-CM | POA: Diagnosis present

## 2023-08-11 DIAGNOSIS — R0602 Shortness of breath: Secondary | ICD-10-CM | POA: Diagnosis present

## 2023-08-11 DIAGNOSIS — N179 Acute kidney failure, unspecified: Secondary | ICD-10-CM | POA: Diagnosis not present

## 2023-08-11 DIAGNOSIS — Z79899 Other long term (current) drug therapy: Secondary | ICD-10-CM | POA: Diagnosis not present

## 2023-08-11 DIAGNOSIS — E1122 Type 2 diabetes mellitus with diabetic chronic kidney disease: Secondary | ICD-10-CM | POA: Diagnosis present

## 2023-08-11 DIAGNOSIS — K219 Gastro-esophageal reflux disease without esophagitis: Secondary | ICD-10-CM | POA: Diagnosis not present

## 2023-08-11 DIAGNOSIS — E118 Type 2 diabetes mellitus with unspecified complications: Secondary | ICD-10-CM | POA: Diagnosis not present

## 2023-08-11 DIAGNOSIS — I480 Paroxysmal atrial fibrillation: Secondary | ICD-10-CM | POA: Diagnosis not present

## 2023-08-11 DIAGNOSIS — I2510 Atherosclerotic heart disease of native coronary artery without angina pectoris: Secondary | ICD-10-CM | POA: Diagnosis not present

## 2023-08-11 DIAGNOSIS — I428 Other cardiomyopathies: Secondary | ICD-10-CM | POA: Diagnosis present

## 2023-08-11 DIAGNOSIS — N1831 Chronic kidney disease, stage 3a: Secondary | ICD-10-CM | POA: Diagnosis present

## 2023-08-11 DIAGNOSIS — E876 Hypokalemia: Secondary | ICD-10-CM | POA: Diagnosis present

## 2023-08-11 DIAGNOSIS — I130 Hypertensive heart and chronic kidney disease with heart failure and stage 1 through stage 4 chronic kidney disease, or unspecified chronic kidney disease: Secondary | ICD-10-CM | POA: Diagnosis present

## 2023-08-11 DIAGNOSIS — I5023 Acute on chronic systolic (congestive) heart failure: Secondary | ICD-10-CM | POA: Diagnosis not present

## 2023-08-11 DIAGNOSIS — Z8249 Family history of ischemic heart disease and other diseases of the circulatory system: Secondary | ICD-10-CM | POA: Diagnosis not present

## 2023-08-11 DIAGNOSIS — I5021 Acute systolic (congestive) heart failure: Secondary | ICD-10-CM | POA: Diagnosis not present

## 2023-08-11 DIAGNOSIS — Z95810 Presence of automatic (implantable) cardiac defibrillator: Secondary | ICD-10-CM | POA: Diagnosis not present

## 2023-08-11 DIAGNOSIS — E781 Pure hyperglyceridemia: Secondary | ICD-10-CM | POA: Diagnosis present

## 2023-08-11 DIAGNOSIS — Z955 Presence of coronary angioplasty implant and graft: Secondary | ICD-10-CM | POA: Diagnosis not present

## 2023-08-11 DIAGNOSIS — Z7984 Long term (current) use of oral hypoglycemic drugs: Secondary | ICD-10-CM | POA: Diagnosis not present

## 2023-08-11 LAB — TROPONIN I (HIGH SENSITIVITY): Troponin I (High Sensitivity): 15 ng/L (ref <18)

## 2023-08-11 LAB — CBG MONITORING, ED
Glucose-Capillary: 176 mg/dL — ABNORMAL HIGH (ref 70–99)
Glucose-Capillary: 92 mg/dL (ref 70–99)

## 2023-08-11 LAB — BRAIN NATRIURETIC PEPTIDE: B Natriuretic Peptide: 2414.3 pg/mL — ABNORMAL HIGH (ref 0.0–100.0)

## 2023-08-11 MED ORDER — THIAMINE MONONITRATE 100 MG PO TABS
200.0000 mg | ORAL_TABLET | Freq: Every day | ORAL | Status: DC
  Administered 2023-08-11 – 2023-08-13 (×3): 200 mg via ORAL
  Filled 2023-08-11 (×3): qty 2

## 2023-08-11 MED ORDER — ONDANSETRON HCL 4 MG PO TABS: 4.0000 mg | ORAL_TABLET | Freq: Four times a day (QID) | ORAL | Status: DC | PRN

## 2023-08-11 MED ORDER — FUROSEMIDE 10 MG/ML IJ SOLN
40.0000 mg | Freq: Once | INTRAMUSCULAR | Status: AC
  Administered 2023-08-11: 40 mg via INTRAVENOUS
  Filled 2023-08-11: qty 4

## 2023-08-11 MED ORDER — HYDRALAZINE HCL 20 MG/ML IJ SOLN: 5.0000 mg | INTRAMUSCULAR | Status: DC | PRN

## 2023-08-11 MED ORDER — CLOPIDOGREL BISULFATE 75 MG PO TABS
75.0000 mg | ORAL_TABLET | Freq: Every day | ORAL | Status: DC
  Administered 2023-08-11 – 2023-08-13 (×3): 75 mg via ORAL
  Filled 2023-08-11 (×3): qty 1

## 2023-08-11 MED ORDER — HEPARIN SODIUM (PORCINE) 5000 UNIT/ML IJ SOLN
5000.0000 [IU] | Freq: Three times a day (TID) | INTRAMUSCULAR | Status: DC
  Administered 2023-08-11 – 2023-08-13 (×5): 5000 [IU] via SUBCUTANEOUS
  Filled 2023-08-11 (×5): qty 1

## 2023-08-11 MED ORDER — SODIUM CHLORIDE 0.9% FLUSH
3.0000 mL | Freq: Two times a day (BID) | INTRAVENOUS | Status: DC
  Administered 2023-08-11 – 2023-08-12 (×3): 3 mL via INTRAVENOUS

## 2023-08-11 MED ORDER — FUROSEMIDE 10 MG/ML IJ SOLN
40.0000 mg | Freq: Two times a day (BID) | INTRAMUSCULAR | Status: DC
  Administered 2023-08-11 – 2023-08-12 (×3): 40 mg via INTRAVENOUS
  Filled 2023-08-11 (×3): qty 4

## 2023-08-11 MED ORDER — INSULIN ASPART 100 UNIT/ML IJ SOLN
0.0000 [IU] | Freq: Three times a day (TID) | INTRAMUSCULAR | Status: DC
  Administered 2023-08-11 – 2023-08-12 (×2): 3 [IU] via SUBCUTANEOUS

## 2023-08-11 MED ORDER — INSULIN ASPART 100 UNIT/ML IJ SOLN: 0.0000 [IU] | Freq: Every day | INTRAMUSCULAR | Status: DC

## 2023-08-11 MED ORDER — ACETAMINOPHEN 325 MG PO TABS: 650.0000 mg | ORAL_TABLET | Freq: Four times a day (QID) | ORAL | Status: DC | PRN

## 2023-08-11 MED ORDER — FENOFIBRATE 160 MG PO TABS
160.0000 mg | ORAL_TABLET | Freq: Every day | ORAL | Status: DC
  Administered 2023-08-11 – 2023-08-13 (×3): 160 mg via ORAL
  Filled 2023-08-11 (×3): qty 1

## 2023-08-11 MED ORDER — ONDANSETRON HCL 4 MG/2ML IJ SOLN: 4.0000 mg | Freq: Four times a day (QID) | INTRAMUSCULAR | Status: DC | PRN

## 2023-08-11 MED ORDER — OXYCODONE HCL 5 MG PO TABS: 5.0000 mg | ORAL_TABLET | ORAL | Status: DC | PRN

## 2023-08-11 MED ORDER — ATORVASTATIN CALCIUM 40 MG PO TABS
40.0000 mg | ORAL_TABLET | Freq: Every day | ORAL | Status: DC
  Administered 2023-08-11 – 2023-08-13 (×3): 40 mg via ORAL
  Filled 2023-08-11 (×3): qty 1

## 2023-08-11 MED ORDER — ACETAMINOPHEN 650 MG RE SUPP: 650.0000 mg | Freq: Four times a day (QID) | RECTAL | Status: DC | PRN

## 2023-08-11 NOTE — Assessment & Plan Note (Signed)
IV ppi.  Aspiration precaution.

## 2023-08-11 NOTE — Assessment & Plan Note (Signed)
 Vitals:   08/10/23 2052 08/11/23 0012 08/11/23 0528 08/11/23 0930  BP: (!) 160/96 (!) 135/104 125/79 (!) 158/69   08/11/23 1300 08/11/23 1330 08/11/23 1400 08/11/23 1425  BP: (!) 147/76 (!) 167/59 (!) 154/106 (!) 145/97   08/11/23 1700  BP: (!) 154/96  Prn Hydralazine .  Lasix  continued.

## 2023-08-11 NOTE — ED Provider Notes (Signed)
 [place] EMERGENCY DEPARTMENT AT [facility] Provider Note  CSN: [id number] Arrival date & time: 08/10/23 2049  Chief Complaint(s) Shortness of Breath  HPI Christopher [name] is a 73 y.o. male history of CHF, coronary artery disease, diabetes, hyperlipidemia presenting to the emergency department shortness of breath.  Patient reports progressive shortness of breath worsening for a few weeks.  Worse with exertion.  Also reports lower extremity swelling, orthopnea.  No chest pain.  No fevers or chills.  No cough.  Reports increased fatigue.  Reports he is compliant with all of his medications but he has been eating a lot of salty foods lately, junk foods.   Past Medical History Past Medical History:  Diagnosis Date   Arthritis    Atrial fibrillation (HCC)    post op, intol of anticoag   Cardiac arrest [facility])    a. s/p MDT single chamber ICD; 11-10-18- pt denies having a heart attack   Cataract    CHF (congestive heart failure) (HCC)    Chronic kidney disease    kidney stone   Coronary artery disease    a. 2/7 Cath: LM nl, LAD min irregs, LCX min irregs, RI 40, RCA 91m, EF 55-60% basal to mid inf HK, 3-4+ MR;  b. 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 Mitral valve prolapse with flail segment of posterior leaflet and severe MR by TEE, remote h/o bacterial endocarditis    Sleep apnea    NPSG 01/21/06- AHI 40.7/hr cpap   Stroke [facility])    summer 2022- one messed up vision, the other  balance   Subacute bacterial endocarditis 03/22/2008   Strep viridans   Ventricular fibrillation (HCC) 11/07/2019   appropriate shock (36J) for VF delivered   Patient Active Problem List   Diagnosis Date Noted   Chronic low back pain 05/22/2022   Research study patient 11/28/2021   GERD without esophagitis 02/15/2021   Diabetes mellitus type 2 with complications (HCC) 02/15/2021   Vertigo as late effect of stroke 02/14/2021   Ventricular tachycardia (HCC) 01/03/2020   Nonischemic cardiomyopathy (HCC) 09/27/2017   Heart valve disease 09/27/2017   History of cardiac arrest 09/27/2017   Alcohol use 05/05/2017   Coronary artery disease involving native coronary artery of native heart without angina pectoris 05/05/2017   OSA on CPAP 05/05/2017   History of repair of mitral valve 05/05/2017   Benign prostatic hyperplasia 05/16/2015   Routine general medical examination at a health care facility 11/13/2014   Chronic systolic CHF (congestive heart failure) (HCC) 10/25/2012   AF (paroxysmal atrial fibrillation) (HCC) 10/25/2012   Hyperlipidemia associated with type 2 diabetes mellitus (HCC) 09/04/2012   Severe mitral regurgitation    Essential hypertension 09/27/2008   SLEEP APNEA 09/27/2008   Home Medication(s) Prior to Admission medications   Medication Sig Start Date End Date Taking? Authorizing Provider  acetaminophen  (TYLENOL ) 500 MG tablet Take 500 mg by mouth every 6 (six) hours as  needed for mild pain (pain score 1-3) or headache.   Yes [provider]  atorvastatin  (LIPITOR) 40 MG tablet Take 1 tablet (40 mg total) by mouth daily. 11/20/22  Yes [name], MD  cetirizine (ZYRTEC) 10 MG tablet Take 10 mg by mouth daily.   Yes [provider]  cholecalciferol  (VITAMIN D ) 25 MCG (1000 UNIT) tablet Take 1 tablet by mouth daily.    Yes [provider]  clopidogrel  (PLAVIX ) 75 MG tablet TAKE 1 TABLET BY MOUTH DAILY 06/28/23  Yes [name], MD  FARXIGA  10 MG TABS tablet TAKE 1 TABLET BY MOUTH EVERY DAY BEFORE BREAKFAST 12/09/22  Yes Camnitz, Soyla Lunger, MD  fenofibrate  (TRICOR ) 145 MG tablet Take 1 tablet (145 mg total) by mouth daily. 11/20/22  Yes [name], MD  fluticasone  (FLONASE ) 50 MCG/ACT nasal spray USE 2 SPRAYS IN BOTH NOSTRILS  DAILY 02/05/23  Yes [name], MD  Insulin  Lispro Prot & Lispro (HUMALOG  MIX 75/25 KWIKPEN) (75-25) 100 UNIT/ML Kwikpen Inject 18 Units into the skin in the morning and at bedtime. 11/20/22  Yes [name], MD  metFORMIN  (GLUCOPHAGE ) 1000 MG tablet TAKE 1 TABLET BY MOUTH TWICE  DAILY WITH A MEAL 03/12/23  Yes [name], MD  metoprolol  succinate (TOPROL -XL) 100 MG 24 hr tablet Take 1 tablet (100 mg total) by mouth daily. 11/20/22  Yes [name], MD  OVER THE COUNTER MEDICATION 1 tablet in the morning and at bedtime. [facility] - Restore multivitamin   Yes [provider]  sacubitril -valsartan  (ENTRESTO ) 97-103 MG TAKE 1 TABLET BY MOUTH TWICE A DAY 02/12/23  Yes Hilty, Vinie BROCKS, MD  spironolactone  (ALDACTONE ) 25 MG tablet TAKE ONE-HALF TABLET BY MOUTH  DAILY 02/05/23  Yes [name], MD  tamsulosin  (FLOMAX ) 0.4 MG CAPS capsule Take 1 capsule (0.4 mg total) by mouth daily. 11/20/22  Yes [name], MD  ACCU-CHEK GUIDE test strip USE AS INSTRUCTED 04/26/23   [name], MD  Accu-Chek Softclix Lancets lancets Use as instructed 03/16/22   [name], MD  blood glucose meter kit and supplies KIT Dispense based on patient and insurance preference. Use up to four times daily as directed. 02/18/21   Regalado, Owen A, MD  Insulin  Pen Needle (PEN NEEDLES 31GX5/16) 31G X 8 MM MISC Use twice a day for insulin  injection, E11.9 05/22/22   [name], MD                                                                                                                                    Past Surgical  History Past Surgical History:  Procedure Laterality Date   AMPUTATION  07/28/2012   Procedure: AMPUTATION DIGIT;  Surgeon: Deward LABOR Schwartz, MD;  Location: [facility] ORS;  Service: Orthopedics;  Laterality: Right;  2nd toe   CARDIAC CATHETERIZATION     COLONOSCOPY  EP IMPLANTABLE DEVICE N/A 05/01/2016   Procedure: Loop Recorder Removal;  Surgeon: Lynwood Rakers, MD;  Location: [facility] INVASIVE CV LAB;  Service: Cardiovascular;  Laterality: N/A;   FOOT SURGERY     BILATERAL TOES   ICD IMPLANT N/A 05/07/2017    Medtronic Visia AF MRI VR SureScan implanted by Dr Inocencio following VF arrest   Implantable loop recorder placement  03/31/2013   MDT Linq implanted by Dr Rakers to evaluate for further afib   INTRAOPERATIVE TRANSESOPHAGEAL ECHOCARDIOGRAM N/A 09/27/2012   Procedure: INTRAOPERATIVE TRANSESOPHAGEAL ECHOCARDIOGRAM;  Surgeon: Sudie VEAR Laine, MD;  Location: [facility] OR;  Service: Open Heart Surgery;  Laterality: N/A;   LEFT AND RIGHT HEART CATHETERIZATION WITH CORONARY ANGIOGRAM N/A 08/12/2012   Procedure: LEFT AND RIGHT HEART CATHETERIZATION WITH CORONARY ANGIOGRAM;  Surgeon: Ezra GORMAN Shuck, MD;  Location: [facility] CATH LAB;  Service: Cardiovascular;  Laterality: N/A;   LEFT HEART CATH AND CORONARY ANGIOGRAPHY N/A 05/06/2017   Procedure: LEFT HEART CATH AND CORONARY ANGIOGRAPHY;  Surgeon: Anner Alm ORN, MD;  Location: [facility] INVASIVE CV LAB;  Service: Cardiovascular;  Laterality: N/A;   LOOP RECORDER IMPLANT N/A 03/31/2013   Procedure: LOOP RECORDER IMPLANT;  Surgeon: Lynwood JONETTA Rakers, MD;  Location: [facility] CATH LAB;  Service: Cardiovascular;  Laterality: N/A;   MITRAL VALVE REPAIR Right 09/27/2012   Procedure: MINIMALLY INVASIVE MITRAL VALVE REPAIR (MVR);  Surgeon: Sudie VEAR Laine, MD;  Location: [facility] OR;  Service: Open Heart Surgery;  Laterality: Right;  Ultrasound guided   PERCUTANEOUS CORONARY STENT INTERVENTION (PCI-S) N/A 08/25/2012   Procedure: PERCUTANEOUS CORONARY STENT INTERVENTION (PCI-S);  Surgeon: Ozell Fell, MD;  Location: [facility] CATH LAB;  Service: Cardiovascular;  Laterality: N/A;   POLYPECTOMY     SEPTOPLASTY     Dr. Floy   TEE WITHOUT CARDIOVERSION N/A 08/12/2012   Procedure: TRANSESOPHAGEAL ECHOCARDIOGRAM (TEE);  Surgeon: Ezra GORMAN Shuck, MD;  Location: [facility] ENDOSCOPY;  Service: Cardiovascular;  Laterality: N/A;   TONSILLECTOMY     UVULOPALATOPHARYNGOPLASTY     Family History Family History  Problem Relation Age of Onset   Diabetes Mother    Stroke Mother    Heart disease Father    Colon cancer Father 77   Diabetes Father    Renal cancer Father    Sudden death Brother    Heart attack Brother    Esophageal cancer Neg Hx    Rectal cancer Neg Hx    Stomach cancer Neg Hx    Colon polyps Neg Hx     Social History Social History   Tobacco Use   Smoking status: Former    Current packs/day: 0.00    Average packs/day: 2.5 packs/day for 5.0 years (12.5 ttl pk-yrs)    Types: Cigarettes    Start date: 07/06/1973    Quit date: 07/06/1978    Years since quitting: 45.1   Smokeless tobacco: Never   Tobacco comments:    social drinker  Vaping Use   Vaping status: Never Used  Substance Use Topics   Alcohol use: Yes    Comment: OCC.   Drug use: No   Allergies Codeine, Dust mite extract, and Pollen extract  Review of Systems Review of Systems  All other systems reviewed and are negative.   Physical Exam Vital Signs  I have reviewed the triage vital signs BP (!) 147/76   Pulse 63   Temp 98.3 F (36.8 C) (Oral)   Resp (!) 22   Ht 5' 10 (1.778 m)   Wt 104.3 kg  SpO2 96%   BMI 33.00 kg/m  Physical Exam Vitals and nursing note reviewed.  Constitutional:      General: He is not in acute distress.    Appearance: Normal appearance.  HENT:     Mouth/Throat:     Mouth: Mucous membranes are moist.  Eyes:     Conjunctiva/sclera: Conjunctivae normal.  Neck:     Vascular: JVD present.  Cardiovascular:     Rate and Rhythm: Normal rate and regular rhythm.   Pulmonary:     Breath sounds: Examination of the right-lower field reveals rales. Examination of the left-lower field reveals rales. Rales present.     Comments: Increased work of breathing not speaking in full sentences Abdominal:     General: Abdomen is flat.     Palpations: Abdomen is soft.     Tenderness: There is no abdominal tenderness.  Musculoskeletal:     Right lower leg: Edema present.     Left lower leg: Edema present.  Skin:    General: Skin is warm and dry.     Capillary Refill: Capillary refill takes less than 2 seconds.  Neurological:     Mental Status: He is alert and oriented to person, place, and time. Mental status is at baseline.  Psychiatric:        Mood and Affect: Mood normal.        Behavior: Behavior normal.     ED Results and Treatments Labs (all labs ordered are listed, but only abnormal results are displayed) Labs Reviewed  BASIC METABOLIC PANEL - Abnormal; Notable for the following components:      Result Value   CO2 19 (*)    Glucose, Bld 114 (*)    Creatinine, Ser 1.55 (*)    GFR, Estimated 47 (*)    All other components within normal limits  CBC - Abnormal; Notable for the following components:   MCV 100.4 (*)    MCHC 29.8 (*)    All other components within normal limits  BRAIN NATRIURETIC PEPTIDE - Abnormal; Notable for the following components:   B Natriuretic Peptide 2,414.3 (*)    All other components within normal limits  TROPONIN I (HIGH SENSITIVITY)  TROPONIN I (HIGH SENSITIVITY)                                                                                                                          Radiology DG Chest 2 View Result Date: 08/10/2023 CLINICAL DATA:  Shortness of breath EXAM: CHEST - 2 VIEW COMPARISON:  03/31/2020 FINDINGS: Mild cardiomegaly. Unchanged position of left chest wall single lead AICD lead. Small pleural effusions and mild pulmonary edema. IMPRESSION: Mild congestive heart failure. Electronically Signed    By: Franky Stanford M.D.   On: 08/10/2023 21:36    Pertinent labs & imaging results that were available during my care of the patient were reviewed by me and considered in my medical decision making (see MDM for details).  Medications Ordered in  ED Medications  albuterol  (VENTOLIN  HFA) 108 (90 Base) MCG/ACT inhaler 2 puff (has no administration in time range)  furosemide  (LASIX ) injection 40 mg (40 mg Intravenous Given 08/11/23 1301)                                                                                                                                     Procedures Procedures  (including critical care time)  Medical Decision Making / ED Course   MDM:  73 year old presenting to the emergency department shortness of breath.  Suspect likely CHF exacerbation.  He appears volume overloaded.  He endorses dietary indiscretion which is the likely cause of his CHF exacerbation.  His chest x-ray and BNP are also suggestive of CHF exacerbation.  Will treat with Lasix .  Differential also includes other process such as pneumonia, but no fevers, chills or cough.  Chest x-ray with no evidence of pneumothorax.  Low concern for pulmonary embolism without chest pain, pleuritic pain, risk factors such as recent travel or surgery.  Labs without evidence of anemia.  Troponin negative.  Discussed with hospitalist given work of breathing.   Clinical Course as of 08/11/23 1333  Wed Aug 11, 2023  1330 Discussed with hospitalist who will admit for CHF exacerbation  [WS]    Clinical Course User Index [WS] Francesca Elsie CROME, MD     Additional history obtained:  -External records from outside source obtained and reviewed including: Chart review including previous notes, labs, imaging, consultation notes including prior cardiology notes    Lab Tests: -I ordered, reviewed, and interpreted labs.   The pertinent results include:   Labs Reviewed  BASIC METABOLIC PANEL - Abnormal; Notable for the  following components:      Result Value   CO2 19 (*)    Glucose, Bld 114 (*)    Creatinine, Ser 1.55 (*)    GFR, Estimated 47 (*)    All other components within normal limits  CBC - Abnormal; Notable for the following components:   MCV 100.4 (*)    MCHC 29.8 (*)    All other components within normal limits  BRAIN NATRIURETIC PEPTIDE - Abnormal; Notable for the following components:   B Natriuretic Peptide 2,414.3 (*)    All other components within normal limits  TROPONIN I (HIGH SENSITIVITY)  TROPONIN I (HIGH SENSITIVITY)    Notable for mild AKI vs worsening CKD, elevated BNP       Imaging Studies ordered: I ordered imaging studies including CXR On my interpretation imaging demonstrates CHF I independently visualized and interpreted imaging. I agree with the radiologist interpretation   Medicines ordered and prescription drug management: Meds ordered this encounter  Medications   albuterol  (VENTOLIN  HFA) 108 (90 Base) MCG/ACT inhaler 2 puff   furosemide  (LASIX ) injection 40 mg    -I have reviewed the patients home medicines and have made adjustments as needed   Consultations Obtained: I requested consultation  with the hospitalist,  and discussed lab and imaging findings as well as pertinent plan - they recommend: admission    Social Determinants of Health:  Diagnosis or treatment significantly limited by social determinants of health: obesity   Reevaluation: After the interventions noted above, I reevaluated the patient and found that their symptoms have improved  Co morbidities that complicate the patient evaluation  Past Medical History:  Diagnosis Date   Arthritis    Atrial fibrillation (HCC)    post op, intol of anticoag   Cardiac arrest (HCC)    a. s/p MDT single chamber ICD; 11-10-18- pt denies having a heart attack   Cataract    CHF (congestive heart failure) (HCC)    Chronic kidney disease    kidney stone   Coronary artery disease    a. 2/7 Cath:  LM nl, LAD min irregs, LCX min irregs, RI 40, RCA 50m, EF 55-60% basal to mid inf HK, 3-4+ MR;  b. 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 Mitral valve prolapse with flail segment of posterior leaflet and severe MR by TEE, remote h/o bacterial endocarditis    Sleep apnea    NPSG 01/21/06- AHI 40.7/hr cpap   Stroke [facility])    summer 2022- one messed up vision, the other balance   Subacute bacterial endocarditis 03/22/2008   Strep viridans   Ventricular fibrillation (HCC) 11/07/2019   appropriate shock (36J) for VF delivered      Dispostion: Disposition decision including need for hospitalization was considered, and patient admitted to the hospital.    Final Clinical Impression(s) / ED Diagnoses Final diagnoses:  Acute congestive heart failure, unspecified heart failure type [facility])     This chart was dictated using voice recognition software.  Despite best efforts to proofread,  errors can occur which can change the documentation meaning.    Francesca Elsie CROME, MD 08/11/23 [phone]

## 2023-08-11 NOTE — Assessment & Plan Note (Signed)
 2/2 CHF.

## 2023-08-11 NOTE — Assessment & Plan Note (Signed)
Admit to telemetry. Cardiology consulted.  Continue lasix 40 mg bid x 2 doses / monitor creatinine / electrolytes. Repeat echo per cards.

## 2023-08-11 NOTE — Assessment & Plan Note (Signed)
 Cont plavix and statin

## 2023-08-11 NOTE — ED Notes (Signed)
 Called [facility] [phone] to initiate cardiac monitoring

## 2023-08-11 NOTE — Assessment & Plan Note (Signed)
 CIWA.Thiamine 

## 2023-08-11 NOTE — Assessment & Plan Note (Signed)
Monitor and HOLD: Entresto/ metformin /farxiga.  Monitor and resume as deemed appropriate.

## 2023-08-11 NOTE — Assessment & Plan Note (Signed)
Glycemic protocol.   

## 2023-08-11 NOTE — Assessment & Plan Note (Signed)
 In sinus rhythm today , pt is not on AC, med rec done shows plavix .

## 2023-08-11 NOTE — Consult Note (Signed)
 Cardiology Consultation   Patient ID: Christopher [name] MRN: [id number]; DOB: 02-May-1951  Admit date: 08/10/2023 Date of Consult: 08/11/2023  PCP:  [name], MD   [facility] Providers Cardiologist:  Vinie JAYSON Maxcy, MD  Electrophysiologist:  Soyla Lunger Norton, MD       Patient Profile:   Christopher [name] is a 73 y.o. male with a hx of Chonic systolic congestive Heart Failure, coronary artery disease, CAD S/P PCI RCA 14, mitral valve anuloplasty status post 09/27/12, Post OP A-Fib in 14, ventricular fibrillation status post ICD in 11/18, cerebral vascular accident in 2022, hyperlipidemia, hypertension, who is being seen 08/11/2023 for the evaluation of shortness of breath at the request of Mario Blanch MD.  History of Present Illness:   Christopher [name] a 73 y.o. male with a hx noted above who is being seen on 08/11/2023 in the emergency department for worsening shortness of breath.  In 2014 Christopher [name] received PCI to the RCA for CAD and mitral valve repair for subacute bacterial endocarditis. Post op he had an episode of A-Fib he was followed by Dr. Kelsie for his Loop recorder. Subsequently the patient had Vfib and cardiac arrest afterward he received a Medtronic single-chamber ICD implant in 05/2017. The cath report showed a severely elevated LVEDP, an EF 35-45%, lat ramus lesion 50%, ramus lesion 35% and a mRCA lesion 25%. The LVEF subsequently improved to 55 to 60% on medical therapy.  In 02/2021 he had a CVA at this time his EF fell to 30 to 35% with anterior apical and inferoapical wall motion abnormality no ischemic evaluation was performed at this time.  After this event he was following up with Hao Meng, PA-C for titration of his heart failure medications.  Entresto  Farxiga  spironolactone  and Toprol  XL.  Subsequent echos showed his EF improved to 45 to 50% the mitral valve gradients suggested mild narrowing of the mitral valve annuloplasty.  In 2023 he had enrolled in a  research trial for elevated triglycerides. Coronary CTA during the study found a positive FFR possible occlusion proximal to a know RCA stent.  despite this finding the patient is completely asymptomatic.  Because he was asymptomatic no treatment was pursued.  During the trial his labs showed marked reduction in triglycerides he is no longer on this therapy.   05/31/2023 an echo showed his EF was stable at 45%.  Left ventricle had global hypokinesis.  The annuloplasty ring had no significant stenosis, no evidence of mitral regurgitation, and no evidence of mitral stenosis. The RV Function was normal  Was seen outpatient by Lesia Ozell Barter, PA-C on 06/17/2023 for routine electrophysiology follow-up.   08/11/2023 patient presented to the ED for shortness of breath  Patient reports shortness of breath for the past week. Shortness of breath is worse with exertion.  Associated symptoms include lateral lower extremity swelling, orthopnea and abdominal distension.  No chest pain fevers chills or cough. At home he eats a diet that is high in salt including soup, can food, chips, and peanuts.  Discussed with the patient importance of eating fresh foods that are low in salt Past Medical History:  Diagnosis Date   Arthritis    Atrial fibrillation (HCC)    post op, intol of anticoag   Cardiac arrest (HCC)    a. s/p MDT single chamber ICD; 11-10-18- pt denies having a heart attack   Cataract    CHF (congestive heart failure) (HCC)    Chronic kidney disease  kidney stone   Coronary artery disease    a. 2/7 Cath: LM nl, LAD min irregs, LCX min irregs, RI 40, RCA 25m, EF 55-60% basal to mid inf HK, 3-4+ MR;  b. 08/25/2012 PCI of RCA with 4.0x15 Vision BMS   Diabetes mellitus without complication (HCC)    GERD (gastroesophageal reflux disease)    hx   Heart murmur    Hyperlipidemia    on statin   Hypertension    Myocardial infarction (HCC)    PONV (postoperative nausea and vomiting)    S/P mitral  valve repair 09/27/2012   Complex valvuloplasty including triangular resection of flail posterior leaflet with 30 mm Sorin Memo 3D ring annuloplasty via right mini thoracotomy approach   seasonal allergies 09/27/2008   Severe mitral regurgitation    a. Mitral valve prolapse with flail segment of posterior leaflet and severe MR by TEE, remote h/o bacterial endocarditis    Sleep apnea    NPSG 01/21/06- AHI 40.7/hr cpap   Stroke [facility])    summer 2022- one messed up vision, the other balance   Subacute bacterial endocarditis 03/22/2008   Strep viridans   Ventricular fibrillation (HCC) 11/07/2019   appropriate shock (36J) for VF delivered    Past Surgical History:  Procedure Laterality Date   AMPUTATION  07/28/2012   Procedure: AMPUTATION DIGIT;  Surgeon: Deward DELENA Schwartz, MD;  Location: [facility] ORS;  Service: Orthopedics;  Laterality: Right;  2nd toe   CARDIAC CATHETERIZATION     COLONOSCOPY     EP IMPLANTABLE DEVICE N/A 05/01/2016   Procedure: Loop Recorder Removal;  Surgeon: Lynwood Rakers, MD;  Location: [facility] INVASIVE CV LAB;  Service: Cardiovascular;  Laterality: N/A;   FOOT SURGERY     BILATERAL TOES   ICD IMPLANT N/A 05/07/2017    Medtronic Visia AF MRI VR SureScan implanted by Dr Inocencio following VF arrest   Implantable loop recorder placement  03/31/2013   MDT Linq implanted by Dr Rakers to evaluate for further afib   INTRAOPERATIVE TRANSESOPHAGEAL ECHOCARDIOGRAM N/A 09/27/2012   Procedure: INTRAOPERATIVE TRANSESOPHAGEAL ECHOCARDIOGRAM;  Surgeon: Sudie VEAR Laine, MD;  Location: [facility] OR;  Service: Open Heart Surgery;  Laterality: N/A;   LEFT AND RIGHT HEART CATHETERIZATION WITH CORONARY ANGIOGRAM N/A 08/12/2012   Procedure: LEFT AND RIGHT HEART CATHETERIZATION WITH CORONARY ANGIOGRAM;  Surgeon: Ezra GORMAN Shuck, MD;  Location: [facility] CATH LAB;  Service: Cardiovascular;  Laterality: N/A;   LEFT HEART CATH AND CORONARY ANGIOGRAPHY N/A 05/06/2017   Procedure: LEFT HEART CATH AND CORONARY ANGIOGRAPHY;   Surgeon: Anner Alm ORN, MD;  Location: [facility] INVASIVE CV LAB;  Service: Cardiovascular;  Laterality: N/A;   LOOP RECORDER IMPLANT N/A 03/31/2013   Procedure: LOOP RECORDER IMPLANT;  Surgeon: Lynwood JONETTA Rakers, MD;  Location: [facility] CATH LAB;  Service: Cardiovascular;  Laterality: N/A;   MITRAL VALVE REPAIR Right 09/27/2012   Procedure: MINIMALLY INVASIVE MITRAL VALVE REPAIR (MVR);  Surgeon: Sudie VEAR Laine, MD;  Location: [facility] OR;  Service: Open Heart Surgery;  Laterality: Right;  Ultrasound guided   PERCUTANEOUS CORONARY STENT INTERVENTION (PCI-S) N/A 08/25/2012   Procedure: PERCUTANEOUS CORONARY STENT INTERVENTION (PCI-S);  Surgeon: Ozell Fell, MD;  Location: [facility] CATH LAB;  Service: Cardiovascular;  Laterality: N/A;   POLYPECTOMY     SEPTOPLASTY     Dr. Floy   TEE WITHOUT CARDIOVERSION N/A 08/12/2012   Procedure: TRANSESOPHAGEAL ECHOCARDIOGRAM (TEE);  Surgeon: Ezra GORMAN Shuck, MD;  Location: [facility] ENDOSCOPY;  Service: Cardiovascular;  Laterality: N/A;   TONSILLECTOMY  UVULOPALATOPHARYNGOPLASTY         Inpatient Medications: Scheduled Meds:  atorvastatin   40 mg Oral Daily   clopidogrel   75 mg Oral Daily   fenofibrate   160 mg Oral Daily   furosemide   40 mg Intravenous BID   insulin  aspart  0-15 Units Subcutaneous TID WC   insulin  aspart  0-5 Units Subcutaneous QHS   sodium chloride  flush  3 mL Intravenous Q12H   Continuous Infusions:  PRN Meds: acetaminophen  **OR** acetaminophen , albuterol , hydrALAZINE , ondansetron  **OR** ondansetron  (ZOFRAN ) IV, oxyCODONE   Allergies:    Allergies  Allergen Reactions   Codeine Other (See Comments)    Causes bad constipation   Dust Mite Extract Cough   Pollen Extract Cough    Social History:   Social History   Socioeconomic History   Marital status: Divorced    Spouse name: Not on file   Number of children: 0   Years of education: Not on file   Highest education level: 12th grade  Occupational History   Occupation: Works at [employer]  Retired  Tobacco Use   Smoking status: Former    Current packs/day: 0.00    Average packs/day: 2.5 packs/day for 5.0 years (12.5 ttl pk-yrs)    Types: Cigarettes    Start date: 07/06/1973    Quit date: 07/06/1978    Years since quitting: 45.1   Smokeless tobacco: Never   Tobacco comments:    social drinker  Vaping Use   Vaping status: Never Used  Substance and Sexual Activity   Alcohol use: Yes    Comment: OCC.   Drug use: No   Sexual activity: Yes  Other Topics Concern   Not on file  Social History Narrative   Works at [employer] - plans to retire 07/2015 after 36 years.   Divorced, lives alone   Supportive g-friend (shirlee moore)         Social Drivers of Health   Financial Resource Strain: Low Risk  (05/27/2023)   Overall Financial Resource Strain (CARDIA)    Difficulty of Paying Living Expenses: Not hard at all  Food Insecurity: No Food Insecurity (05/27/2023)   Hunger Vital Sign    Worried About Running Out of Food in the Last Year: Never true    Ran Out of Food in the Last Year: Never true  Transportation Needs: No Transportation Needs (05/27/2023)   PRAPARE - Administrator, Civil Service (Medical): No    Lack of Transportation (Non-Medical): No  Physical Activity: Unknown (05/27/2023)   Exercise Vital Sign    Days of Exercise per Week: 0 days    Minutes of Exercise per Session: Not on file  Stress: No Stress Concern Present (05/27/2023)   [employer] of Occupational Health - Occupational Stress Questionnaire    Feeling of Stress : Not at all  Social Connections: Unknown (05/27/2023)   Social Connection and Isolation Panel [NHANES]    Frequency of Communication with Friends and Family: More than three times a week    Frequency of Social Gatherings with Friends and Family: Once a week    Attends Religious Services: Patient declined    Database Administrator or Organizations: No    Attends Banker Meetings: Not on file    Marital  Status: Widowed  Intimate Partner Violence: Not At Risk (09/05/2020)   Humiliation, Afraid, Rape, and Kick questionnaire    Fear of Current or Ex-Partner: No    Emotionally Abused: No    Physically Abused: No  Sexually Abused: No    Family History:    Family History  Problem Relation Age of Onset   Diabetes Mother    Stroke Mother    Heart disease Father    Colon cancer Father 73   Diabetes Father    Renal cancer Father    Sudden death Brother    Heart attack Brother    Esophageal cancer Neg Hx    Rectal cancer Neg Hx    Stomach cancer Neg Hx    Colon polyps Neg Hx      ROS:  Please see the history of present illness.   All other ROS reviewed and negative.     Physical Exam/Data:   Vitals:   08/11/23 0528 08/11/23 0930 08/11/23 1300 08/11/23 1331  BP: 125/79 (!) 158/69 (!) 147/76   Pulse: (!) 57 73 63   Resp: 20 20 (!) 22   Temp: 98.6 F (37 C) 98 F (36.7 C)  98.3 F (36.8 C)  TempSrc: Oral Oral  Oral  SpO2: 98% 99% 96%   Weight:      Height:       No intake or output data in the 24 hours ending 08/11/23 1349    08/10/2023    8:56 PM 06/17/2023   12:06 PM 05/31/2023    8:16 AM  Last 3 Weights  Weight (lbs) 230 lb 230 lb 227 lb  Weight (kg) 104.327 kg 104.327 kg 102.967 kg     Body mass index is 33 kg/m.  General:  Well nourished, well developed, in mild respiratory distress on 2 l of oxygen. HEENT: normal Neck: no JVD Vascular: No carotid bruits; Distal pulses 2+ bilaterally Cardiac:  normal S1, S2; RRR; no murmur  Lungs:   wheezing present bilaterally, no rhonchi or rales  Abd: hard and distended Ext: 2+ edema on lower extremities Musculoskeletal:  No deformities, BUE and BLE strength normal and equal Skin: warm and dry  Psych:  Normal affect   EKG:  The EKG was personally reviewed and demonstrates:  EKG showed normal sinus rhythm with a PVC, and a right bundle branch block.  Unchanged from prior EKG Telemetry:  Telemetry was personally reviewed  and demonstrates:  frequent PVC's with NSR  Relevant CV Studies: Cath 05/06/17  There is moderate left ventricular systolic dysfunction. LV end diastolic pressure is severely elevated. The left ventricular ejection fraction is 35-45% by visual estimate. Lat Ramus lesion, 50 %stenosed. Ramus lesion, 35 %stenosed. Mid RCA lesion, 25 %stenosed. Mid RCA to Dist RCA lesion, 0 %stenosed.  Echo 05/31/23 IMPRESSIONS     1. Left ventricular ejection fraction, by estimation, is 45%. The left  ventricle has normal function. The left ventricle demonstrates global  hypokinesis. The left ventricular internal cavity size was moderately  dilated. Left ventricular diastolic  parameters are indeterminate.   2. Right ventricular systolic function is normal. The right ventricular  size is normal.   3. Left atrial size was moderately dilated.   4. Post repair with 30 mm annuloplasty ring no significant stenosis mean  gradient 3 mmHg at HR 69 bpm trivial residual MR . The mitral valve has  been repaired/replaced. No evidence of mitral valve regurgitation. No  evidence of mitral stenosis. There is  a 30 mm prosthetic annuloplasty ring present in the mitral position.  Procedure Date: 2014.   5. The aortic valve is tricuspid. There is mild calcification of the  aortic valve. Aortic valve regurgitation is mild. Aortic valve sclerosis  is present,  with no evidence of aortic valve stenosis.   6. The inferior vena cava is normal in size with greater than 50%  respiratory variability, suggesting right atrial pressure of 3 mmHg.   FINDINGS   Left Ventricle: Left ventricular ejection fraction, by estimation, is  45%. The left ventricle has normal function. The left ventricle  demonstrates global hypokinesis. The left ventricular internal cavity size  was moderately dilated. There is no left  ventricular hypertrophy. Left ventricular diastolic parameters are  indeterminate.   Right Ventricle: The right  ventricular size is normal. No increase in  right ventricular wall thickness. Right ventricular systolic function is  normal.   Left Atrium: Left atrial size was moderately dilated.   Right Atrium: Right atrial size was normal in size.   Pericardium: There is no evidence of pericardial effusion.   Mitral Valve: Post repair with 30 mm annuloplasty ring no significant  stenosis mean gradient 3 mmHg at HR 69 bpm trivial residual MR. The mitral  valve has been repaired/replaced. No evidence of mitral valve  regurgitation. There is a 30 mm prosthetic  annuloplasty ring present in the mitral position. Procedure Date: 2014. No  evidence of mitral valve stenosis. MV peak gradient, 7.3 mmHg. The mean  mitral valve gradient is 3.0 mmHg.   Tricuspid Valve: The tricuspid valve is normal in structure. Tricuspid  valve regurgitation is mild . No evidence of tricuspid stenosis.   Aortic Valve: The aortic valve is tricuspid. There is mild calcification  of the aortic valve. Aortic valve regurgitation is mild. Aortic  regurgitation PHT measures 355 msec. Aortic valve sclerosis is present,  with no evidence of aortic valve stenosis.   Pulmonic Valve: The pulmonic valve was normal in structure. Pulmonic valve  regurgitation is not visualized. No evidence of pulmonic stenosis.   Aorta: The aortic root is normal in size and structure.   Venous: The inferior vena cava is normal in size with greater than 50%  respiratory variability, suggesting right atrial pressure of 3 mmHg.   IAS/Shunts: No atrial level shunt detected by color flow Doppler.  Laboratory Data:  High Sensitivity Troponin:   Recent Labs  Lab 08/10/23 2135 08/11/23 0006  TROPONINIHS 12 15     Chemistry Recent Labs  Lab 08/10/23 2135  NA 136  K 3.9  CL 106  CO2 19*  GLUCOSE 114*  BUN 19  CREATININE 1.55*  CALCIUM  9.0  GFRNONAA 47*  ANIONGAP 11    No results for input(s): PROT, ALBUMIN , AST, ALT, ALKPHOS,  BILITOT in the last 168 hours. Lipids No results for input(s): CHOL, TRIG, HDL, LABVLDL, LDLCALC, CHOLHDL in the last 168 hours.  Hematology Recent Labs  Lab 08/10/23 2135  WBC 7.5  RBC 4.54  HGB 13.6  HCT 45.6  MCV 100.4*  MCH 30.0  MCHC 29.8*  RDW 14.9  PLT 171   Thyroid  No results for input(s): TSH, FREET4 in the last 168 hours.  BNP Recent Labs  Lab 08/11/23 0006  BNP 2,414.3*    DDimer No results for input(s): DDIMER in the last 168 hours.   Radiology/Studies:  DG Chest 2 View Result Date: 08/10/2023 CLINICAL DATA:  Shortness of breath EXAM: CHEST - 2 VIEW COMPARISON:  03/31/2020 FINDINGS: Mild cardiomegaly. Unchanged position of left chest wall single lead AICD lead. Small pleural effusions and mild pulmonary edema. IMPRESSION: Mild congestive heart failure. Electronically Signed   By: Franky Stanford M.D.   On: 08/10/2023 21:36     Assessment and Plan:  Christopher [name] is a 73 y.o. male with a hx of Chonic systolic congestive Heart Failure, coronary artery disease, ventricular fibrillation, diabetes, status post ICD implant on 11/18, mitral valve repair status post 09/27/12, hyperlipidemia, A-fib, hypertension, cerebral vascular accident in 2022 who is being seen 08/11/2023 for the evaluation of shortness of breath at the request of Mario Blanch MD.  Acute on chronic systolic CHF, NICM  Patient reports shortness of breath for the past week. Shortness of breath is worse with exertion.  Associated symptoms include lateral lower extremity swelling, orthopnea and abdominal distension.  No chest pain fevers chills or cough. At home he eats a diet that is high in salt including soup, can food, chips, and peanuts.  Discussed with the patient importance of eating fresh foods that are low in salt  -- Chest x-ray showed mild cardiomegaly small pleural effusions and mild pulmonary edema.  -- Creatinine was mildly elevated at 1.55 from 1.43 in December.  Potassium was  3.9. His BNP was 2414, high sensitivity troponins stable 12>> 15. -- Echo on 05/31/2023 an echo showed an EF of 45% -- on IV lasix  40 mg bid -- Daily weights, strict I/O, Low salt diet.  GDMT -- home medications include metoprolol  XL 100 mg daily, Entresto  97-103 mg twice daily, spironolactone  daily, and Farxiga  10 mg daily. -- add back as BP tolerates per diuresis.  CVA Continue Plavix  75 mg daily, ASA 81 mg daily  CAD S/P PCI RCA 14  - Continue Plavix  75 mg daily, ASA 81 mg daily, Lipitor 40 mg daily  HTN -- On metoprolol  XL 100 mg daily, Entresto  97-103 mg twice daily, spironolactone  daily  HLD -- On Lipitor 40 mg daily, goal is an LDL less than 55.  On 5/24 her LDL was 60  A-Fib post op in 2014 -- had 1 episode post op received a loop in 2014 and has not had any since.   Risk Assessment/Risk Scores:        [place]  Heart Association (NYHA) Functional Class NYHA Class III      For questions or updates, please contact [facility] Please consult [email or web address] for contact info under    Signed, Morse Clause, PA-C  08/11/2023 1:49 PM

## 2023-08-11 NOTE — Assessment & Plan Note (Signed)
 CPAP per home settings.

## 2023-08-11 NOTE — H&P (Signed)
 History and Physical    Patient: Christopher [name] FMW:[id number] DOB: 11-04-1950 DOA: 08/10/2023 DOS: the patient was seen and examined on 08/11/2023 PCP: [name], MD  Patient coming from: Home Chief complaint: Chief Complaint  Patient presents with   Shortness of Breath   HPI:  Christopher [name] is a 73 y.o. male with past medical history  of  Chronic systolic CHF ef 45% , ICM, CAD, h/o VF, h/o MV repair for endocarditis, h/o CVA, and HTN  coming with progressive shortness of breath for past few weeks.  Patient states compliant with all his medications.  No reports of chest pain or extremity edema palpitations.  Chart reviewed patient is currently on dual antiplatelet therapy, 1 note states patient is on Eliquis  will have to verify if patient is indeed on Eliquis  or DAPT.  Patient had an AICD visit in December and did not have any shocks.   >>ED Course: In emergency room alert awake oriented, afebrile O2 sats of 99% on SpO2: 100 % O2 Flow Rate (L/min): 2 L/min  Vitals:   08/11/23 1400 08/11/23 1425 08/11/23 1430 08/11/23 1700  BP: (!) 154/106 (!) 145/97  (!) 154/96  Pulse: 77 84 67 80  Temp:      Resp: 17 19 18 18   Height:      Weight:      SpO2: 97% 100% 100% 100%  TempSrc:      BMI (Calculated):       ED evaluation  so far shows: Metabolic panel showing bicarb of 19, AKI with a creatinine of 1.55 EGFR 45, LFTs added and pending. CBC shows MCV of 100.4 otherwise within normal limits. EKG today shows sinus rhythm with occasional PVCs and a right bundle branch block QTc of 505 BNP of 2414.3, troponin of 12 and repeat troponin of 15. Cardiology consulted - cardiology Master.  In the emergency room  pt has received the following treatment thus far: Medications  albuterol  (VENTOLIN  HFA) 108 (90 Base) MCG/ACT inhaler 2 puff (has no administration in time range)  furosemide  (LASIX ) injection 40 mg (40 mg Intravenous Given 08/11/23 1301)   Review of Systems  Respiratory:   Positive for shortness of breath.   Cardiovascular:  Negative for chest pain, palpitations, orthopnea and leg swelling.   Past Medical History:  Diagnosis Date   Arthritis    Atrial fibrillation (HCC)    post op, intol of anticoag   Cardiac arrest [facility])    a. s/p MDT single chamber ICD; 11-10-18- pt denies having a heart attack   Cataract    CHF (congestive heart failure) (HCC)    Chronic kidney disease    kidney stone   Coronary artery disease    a. 2/7 Cath: LM nl, LAD min irregs, LCX min irregs, RI 40, RCA 12m, EF 55-60% basal to mid inf HK, 3-4+ MR;  b. 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 Mitral valve prolapse with flail  segment of posterior leaflet and severe MR by TEE, remote h/o bacterial endocarditis    Sleep apnea    NPSG 01/21/06- AHI 40.7/hr cpap   Stroke [facility])    summer 2022- one messed up vision, the other balance   Subacute bacterial endocarditis 03/22/2008   Strep viridans   Ventricular fibrillation (HCC) 11/07/2019   appropriate shock (36J) for VF delivered   Past Surgical History:  Procedure Laterality Date   AMPUTATION  07/28/2012   Procedure: AMPUTATION DIGIT;  Surgeon: Deward DELENA Schwartz, MD;  Location: [facility] ORS;  Service: Orthopedics;  Laterality: Right;  2nd toe   CARDIAC CATHETERIZATION     COLONOSCOPY     EP IMPLANTABLE DEVICE N/A 05/01/2016   Procedure: Loop Recorder Removal;  Surgeon: Lynwood Rakers, MD;  Location: [facility] INVASIVE CV LAB;  Service: Cardiovascular;   Laterality: N/A;   FOOT SURGERY     BILATERAL TOES   ICD IMPLANT N/A 05/07/2017    Medtronic Visia AF MRI VR SureScan implanted by Dr Inocencio following VF arrest   Implantable loop recorder placement  03/31/2013   MDT Linq implanted by Dr Rakers to evaluate for further afib   INTRAOPERATIVE TRANSESOPHAGEAL ECHOCARDIOGRAM N/A 09/27/2012   Procedure: INTRAOPERATIVE TRANSESOPHAGEAL ECHOCARDIOGRAM;  Surgeon: Sudie VEAR Laine, MD;  Location: [facility] OR;  Service: Open Heart Surgery;  Laterality: N/A;   LEFT AND RIGHT HEART CATHETERIZATION WITH CORONARY ANGIOGRAM N/A 08/12/2012   Procedure: LEFT AND RIGHT HEART CATHETERIZATION WITH CORONARY ANGIOGRAM;  Surgeon: Ezra GORMAN Shuck, MD;  Location: [facility] CATH LAB;  Service: Cardiovascular;  Laterality: N/A;   LEFT HEART CATH AND CORONARY ANGIOGRAPHY N/A 05/06/2017   Procedure: LEFT HEART CATH AND CORONARY ANGIOGRAPHY;  Surgeon: Anner Alm ORN, MD;  Location: [facility] INVASIVE CV LAB;  Service: Cardiovascular;  Laterality: N/A;   LOOP RECORDER IMPLANT N/A 03/31/2013   Procedure: LOOP RECORDER IMPLANT;  Surgeon: Lynwood JONETTA Rakers, MD;  Location: [facility] CATH LAB;  Service: Cardiovascular;  Laterality: N/A;   MITRAL VALVE REPAIR Right 09/27/2012   Procedure: MINIMALLY INVASIVE MITRAL VALVE REPAIR (MVR);  Surgeon: Sudie VEAR Laine, MD;  Location: [facility] OR;  Service: Open Heart Surgery;  Laterality: Right;  Ultrasound guided   PERCUTANEOUS CORONARY STENT INTERVENTION (PCI-S) N/A 08/25/2012   Procedure: PERCUTANEOUS CORONARY STENT INTERVENTION (PCI-S);  Surgeon: Ozell Fell, MD;  Location: [facility] CATH LAB;  Service: Cardiovascular;  Laterality: N/A;   POLYPECTOMY     SEPTOPLASTY     Dr. Floy   TEE WITHOUT CARDIOVERSION N/A 08/12/2012   Procedure: TRANSESOPHAGEAL ECHOCARDIOGRAM (TEE);  Surgeon: Ezra GORMAN Shuck, MD;  Location: [facility] ENDOSCOPY;  Service: Cardiovascular;  Laterality: N/A;   TONSILLECTOMY     UVULOPALATOPHARYNGOPLASTY      reports that he quit smoking about 45 years  ago. His smoking use included cigarettes. He started smoking about 50 years ago. He has a 12.5 pack-year smoking history. He has never used smokeless tobacco. He reports current alcohol use. He reports that he does not use drugs.  Allergies  Allergen Reactions   Codeine Other (See Comments)    Causes bad constipation   Dust Mite Extract Cough   Pollen Extract Cough    Family History  Problem Relation Age of Onset   Diabetes Mother    Stroke Mother    Heart disease Father    Colon cancer Father 21   Diabetes Father    Renal cancer Father    Sudden death Brother    Heart attack Brother    Esophageal cancer Neg  Hx    Rectal cancer Neg Hx    Stomach cancer Neg Hx    Colon polyps Neg Hx     Prior to Admission medications   Medication Sig Start Date End Date Taking? Authorizing Provider  acetaminophen  (TYLENOL ) 500 MG tablet Take 500 mg by mouth every 6 (six) hours as needed for mild pain (pain score 1-3) or headache.   Yes [provider]  atorvastatin  (LIPITOR) 40 MG tablet Take 1 tablet (40 mg total) by mouth daily. 11/20/22  Yes [name], MD  cetirizine (ZYRTEC) 10 MG tablet Take 10 mg by mouth daily.   Yes [provider]  cholecalciferol  (VITAMIN D ) 25 MCG (1000 UNIT) tablet Take 1 tablet by mouth daily.    Yes [provider]  clopidogrel  (PLAVIX ) 75 MG tablet TAKE 1 TABLET BY MOUTH DAILY 06/28/23  Yes [name], MD  FARXIGA  10 MG TABS tablet TAKE 1 TABLET BY MOUTH EVERY DAY BEFORE BREAKFAST 12/09/22  Yes Camnitz, Soyla Lunger, MD  fenofibrate  (TRICOR ) 145 MG tablet Take 1 tablet (145 mg total) by mouth daily. 11/20/22  Yes [name], MD  fluticasone  (FLONASE ) 50 MCG/ACT nasal spray USE 2 SPRAYS IN BOTH NOSTRILS  DAILY 02/05/23  Yes [name], MD  Insulin  Lispro Prot & Lispro (HUMALOG  MIX 75/25 KWIKPEN) (75-25) 100 UNIT/ML Kwikpen Inject 18 Units into the skin in the morning and at bedtime. 11/20/22  Yes  [name], MD  metFORMIN  (GLUCOPHAGE ) 1000 MG tablet TAKE 1 TABLET BY MOUTH TWICE  DAILY WITH A MEAL 03/12/23  Yes [name], MD  metoprolol  succinate (TOPROL -XL) 100 MG 24 hr tablet Take 1 tablet (100 mg total) by mouth daily. 11/20/22  Yes [name], MD  OVER THE COUNTER MEDICATION 1 tablet in the morning and at bedtime. [facility] - Restore multivitamin   Yes [provider]  sacubitril -valsartan  (ENTRESTO ) 97-103 MG TAKE 1 TABLET BY MOUTH TWICE A DAY 02/12/23  Yes Hilty, Vinie BROCKS, MD  spironolactone  (ALDACTONE ) 25 MG tablet TAKE ONE-HALF TABLET BY MOUTH  DAILY 02/05/23  Yes [name], MD  tamsulosin  (FLOMAX ) 0.4 MG CAPS capsule Take 1 capsule (0.4 mg total) by mouth daily. 11/20/22  Yes [name], MD  ACCU-CHEK GUIDE test strip USE AS INSTRUCTED 04/26/23   [name], MD  Accu-Chek Softclix Lancets lancets Use as instructed 03/16/22   [name], MD  blood glucose meter kit and supplies KIT Dispense based on patient and insurance preference. Use up to four times daily as directed. 02/18/21   Regalado, Owen A, MD  Insulin  Pen Needle (PEN NEEDLES 31GX5/16) 31G X 8 MM MISC Use twice a day for insulin  injection, E11.9 05/22/22   [name], MD     Vitals:   08/11/23 1400 08/11/23 1425 08/11/23 1430 08/11/23 1700  BP: (!) 154/106 (!) 145/97  (!) 154/96  Pulse: 77 84 67 80  Resp: 17 19 18 18   Temp:      TempSrc:      SpO2: 97% 100% 100% 100%  Weight:      Height:       Physical Exam Vitals and nursing note reviewed.  Constitutional:      General: He is not in acute distress.    Interventions: Nasal cannula in place.  HENT:     Head: Normocephalic and atraumatic.     Right Ear: Hearing normal.     Left Ear: Hearing normal.  Nose: Nose normal. No nasal deformity.     Mouth/Throat:     Lips: Pink.     Tongue: No lesions.     Pharynx: Oropharynx is clear.  Eyes:     General: Lids are  normal.     Extraocular Movements: Extraocular movements intact.  Cardiovascular:     Rate and Rhythm: Normal rate and regular rhythm.     Heart sounds: Normal heart sounds.  Pulmonary:     Effort: Pulmonary effort is normal.     Breath sounds: Rales present.  Abdominal:     General: Bowel sounds are normal. There is no distension.     Palpations: Abdomen is soft. There is no mass.     Tenderness: There is no abdominal tenderness.  Musculoskeletal:     Right lower leg: No edema.     Left lower leg: No edema.  Skin:    General: Skin is warm.  Neurological:     General: No focal deficit present.     Mental Status: He is alert and oriented to person, place, and time.     Cranial Nerves: Cranial nerves 2-12 are intact.  Psychiatric:        Attention and Perception: Attention normal.        Mood and Affect: Mood normal.        Speech: Speech normal.        Behavior: Behavior normal. Behavior is cooperative.      Labs on Admission: I have personally reviewed following labs and imaging studies Results for orders placed or performed during the hospital encounter of 08/10/23 (from the past 24 hours)  Basic metabolic panel     Status: Abnormal   Collection Time: 08/10/23  9:35 PM  Result Value Ref Range   Sodium 136 135 - 145 mmol/L   Potassium 3.9 3.5 - 5.1 mmol/L   Chloride 106 98 - 111 mmol/L   CO2 19 (L) 22 - 32 mmol/L   Glucose, Bld 114 (H) 70 - 99 mg/dL   BUN 19 8 - 23 mg/dL   Creatinine, Ser 8.44 (H) 0.61 - 1.24 mg/dL   Calcium  9.0 8.9 - 10.3 mg/dL   GFR, Estimated 47 (L) >60 mL/min   Anion gap 11 5 - 15  CBC     Status: Abnormal   Collection Time: 08/10/23  9:35 PM  Result Value Ref Range   WBC 7.5 4.0 - 10.5 K/uL   RBC 4.54 4.22 - 5.81 MIL/uL   Hemoglobin 13.6 13.0 - 17.0 g/dL   HCT 54.3 60.9 - 47.9 %   MCV 100.4 (H) 80.0 - 100.0 fL   MCH 30.0 26.0 - 34.0 pg   MCHC 29.8 (L) 30.0 - 36.0 g/dL   RDW 85.0 88.4 - 84.4 %   Platelets 171 150 - 400 K/uL   nRBC 0.0 0.0 -  0.2 %  Troponin I (High Sensitivity)     Status: None   Collection Time: 08/10/23  9:35 PM  Result Value Ref Range   Troponin I (High Sensitivity) 12 <18 ng/L  Troponin I (High Sensitivity)     Status: None   Collection Time: 08/11/23 12:06 AM  Result Value Ref Range   Troponin I (High Sensitivity) 15 <18 ng/L  Brain natriuretic peptide     Status: Abnormal   Collection Time: 08/11/23 12:06 AM  Result Value Ref Range   B Natriuretic Peptide 2,414.3 (H) 0.0 - 100.0 pg/mL  CBG monitoring, ED     Status:  Abnormal   Collection Time: 08/11/23  4:20 PM  Result Value Ref Range   Glucose-Capillary 176 (H) 70 - 99 mg/dL   No results found for this or any previous visit (from the past 720 hours). CBC:    Latest Ref Rng & Units 08/10/2023    9:35 PM 06/17/2023   12:53 PM 11/20/2022    2:25 PM  CBC  WBC 4.0 - 10.5 K/uL 7.5  6.0  6.1   Hemoglobin 13.0 - 17.0 g/dL 86.3  85.8  84.3   Hematocrit 39.0 - 52.0 % 45.6  42.7  46.2   Platelets 150 - 400 K/uL 171  168  162.0    Basic Metabolic Panel: Recent Labs  Lab 08/10/23 2135  NA 136  K 3.9  CL 106  CO2 19*  GLUCOSE 114*  BUN 19  CREATININE 1.55*  CALCIUM  9.0   Creatinine: Lab Results  Component Value Date   CREATININE 1.55 (H) 08/10/2023   CREATININE 1.43 (H) 06/17/2023   CREATININE 1.22 11/20/2022   Liver Function Tests:    Latest Ref Rng & Units 11/20/2022    2:25 PM 12/23/2021    3:01 PM 11/18/2021    1:24 PM  Hepatic Function  Total Protein 6.0 - 8.3 g/dL 6.6  6.9  7.2   Albumin  3.5 - 5.2 g/dL 4.0  4.5  4.4   AST 0 - 37 U/L 26  21  22    ALT 0 - 53 U/L 27  13  19    Alk Phosphatase 39 - 117 U/L 37  52  42   Total Bilirubin 0.2 - 1.2 mg/dL 0.7  0.5  0.6    Coagulation Profile: No results for input(s): INR, PROTIME in the last 168 hours. Cardiac Enzymes: No results for input(s): CKTOTAL, CKMB, CKMBINDEX, TROPONINI in the last 168 hours. BNP (last 3 results) No results for input(s): PROBNP in the last 8760  hours. HbA1C: No results for input(s): HGBA1C in the last 72 hours. Lipid Profile: No results for input(s): CHOL, HDL, LDLCALC, TRIG, CHOLHDL, LDLDIRECT in the last 72 hours.  Radiological Exams on Admission: DG Chest 2 View Result Date: 08/10/2023 CLINICAL DATA:  Shortness of breath EXAM: CHEST - 2 VIEW COMPARISON:  03/31/2020 FINDINGS: Mild cardiomegaly. Unchanged position of left chest wall single lead AICD lead. Small pleural effusions and mild pulmonary edema. IMPRESSION: Mild congestive heart failure. Electronically Signed   By: Franky Stanford M.D.   On: 08/10/2023 21:36    Data Reviewed: Relevant notes from primary care and specialist visits, past discharge summaries as available in EHR, including Care Everywhere. Prior diagnostic testing as pertinent to current admission diagnoses, Updated medications and problem lists for reconciliation ED course, including vitals, labs, imaging, treatment and response to treatment,Triage notes, nursing and pharmacy notes and ED provider's notes Notable results as noted in HPI.Discussed case with EDMD/ ED APP/ or Specialty MD on call and as needed.  Assessment & Plan SOB (shortness of breath) 2/2 CHF.  Acute on chronic systolic CHF (congestive heart failure) (HCC) Admit to telemetry. Cardiology consulted.  Continue lasix  40 mg bid x 2 doses / monitor creatinine / electrolytes. Repeat echo per cards.  Diabetes mellitus type 2 with complications (HCC) Glycemic protocol.   AKI (acute kidney injury) (HCC) Monitor and HOLD: Entresto / metformin  /farxiga .  Monitor and resume as deemed appropriate.  AF (paroxysmal atrial fibrillation) (HCC) In sinus rhythm today , pt is not on AC, med rec done shows plavix . Essential hypertension Vitals:  08/10/23 2052 08/11/23 0012 08/11/23 0528 08/11/23 0930  BP: (!) 160/96 (!) 135/104 125/79 (!) 158/69   08/11/23 1300 08/11/23 1330 08/11/23 1400 08/11/23 1425  BP: (!) 147/76 (!) 167/59 (!)  154/106 (!) 145/97   08/11/23 1700  BP: (!) 154/96  Prn Hydralazine .  Lasix  continued.   GERD without esophagitis IV ppi. Aspiration precaution.  Coronary artery disease involving native coronary artery of native heart without angina pectoris Cont plavix  and statin.  Alcohol use CIWA.Thiamine    OSA on CPAP CPAP per home settings.   DVT prophylaxis:  Pending med rec.  Consults:  Cardiology.  Advance Care Planning:    Code Status: Full Code   Family Communication:  None pt lives alone.  Disposition Plan:  Home  Severity of Illness: The appropriate patient status for this patient is INPATIENT. Inpatient status is judged to be reasonable and necessary in order to provide the required intensity of service to ensure the patient's safety. The patient's presenting symptoms, physical exam findings, and initial radiographic and laboratory data in the context of their chronic comorbidities is felt to place them at high risk for further clinical deterioration. Furthermore, it is not anticipated that the patient will be medically stable for discharge from the hospital within 2 midnights of admission.   * I certify that at the point of admission it is my clinical judgment that the patient will require inpatient hospital care spanning beyond 2 midnights from the point of admission due to high intensity of service, high risk for further deterioration and high frequency of surveillance required.*  Author: Mario LULLA Blanch, MD 08/11/2023 6:26 PM  For on call review [email or web address].   Unresulted Labs (From admission, onward)     Start     Ordered   08/11/23 1826  Ethanol  Add-on,   AD        08/11/23 1825            Orders Placed This Encounter  Procedures   DG Chest 2 View   Basic metabolic panel   CBC   Brain natriuretic peptide   Ethanol   Diet heart healthy/carb modified Room service appropriate? Yes; Fluid consistency: Thin; Fluid restriction: 1500 mL Fluid   Document Height and  Actual Weight   ED Cardiac monitoring   Utilize spacer/aerochamber with mdi inhaler for COVID-19 positive patients or PUI for COVID-19   If O2 Sat <94% administer O2 at 2 liters/minute via nasal cannula   Apply Diabetes Mellitus Care Plan   STAT CBG when hypoglycemia is suspected. If treated, recheck every 15 minutes after each treatment until CBG >/= 70 mg/dl   Refer to Hypoglycemia Protocol Sidebar Report for treatment of CBG < 70 mg/dl   No HS correction Insulin    Notify physician (specify)   Initiate Heart Failure Care Plan   Daily weights   Strict intake and output   In and Out Cath   Patient Education:   Apply Heart Failure Care Plan   [facility] and [facility] only) Obtain REDS clips reading Every morning   Apply Diabetes Mellitus Care Plan   STAT CBG when hypoglycemia is suspected. If treated, recheck every 15 minutes after each treatment until CBG >/= 70 mg/dl   Refer to Hypoglycemia Protocol Sidebar Report for treatment of CBG < 70 mg/dl   Cardiac Monitoring Continuous x 24 hours Indications for use: Sub-acute heart failure   Maintain IV access   Vital signs   Notify physician (specify)   Mobility Protocol: No Restrictions RN  to initiate protocols based on patient's level of care   Refer to Sidebar Report Refer to ICU, Med-Surg, Progressive, and Step-Down Mobility Protocol Sidebars   Initiate Adult Central Line Maintenance and Catheter Protocol for patients with central line (CVC, PICC, Port, Hemodialysis, Trialysis)   If patient diabetic or glucose greater than 140 notify physician for Sliding Scale Insulin  Orders   Do not place and if present remove PureWick   Initiate Oral Care Protocol   Initiate Carrier Fluid Protocol   RN may order General Admission PRN Orders utilizing General Admission PRN medications (through manage orders) for the following patient needs: allergy symptoms (Claritin ), cold sores (Carmex), cough (Robitussin DM), eye irritation (Liquifilm Tears), hemorrhoids  (Tucks), indigestion (Maalox), minor skin irritation (Hydrocortisone Cream), muscle pain Lucienne Gay), nose irritation (saline nasal spray) and sore throat (Chloraseptic spray).   Full code   Consult to hospitalist   Inpatient consult to Cardiology Consult Timeframe: ROUTINE - requires response within 24 hours; Reason for Consult? CHF Already called   Consult to Transition of Care Team   Consult to Heart Failure Navigation Team [facility], [facility], and [facility])   Nutritional services consult   OT eval and treat   PT eval and treat   Pulse oximetry check with vital signs   Oxygen therapy Mode or (Route): Nasal cannula; Liters Per Minute: 2; Keep O2 saturation between: greater than 92 %   Incentive spirometry   CPAP   CBG monitoring, ED   ED EKG   EKG   ECHOCARDIOGRAM COMPLETE   Insert peripheral IV   Admit to Inpatient (patient's expected length of stay will be greater than 2 midnights or inpatient only procedure)

## 2023-08-12 ENCOUNTER — Inpatient Hospital Stay (HOSPITAL_COMMUNITY): Payer: Medicare Other

## 2023-08-12 ENCOUNTER — Encounter (HOSPITAL_COMMUNITY)
Admission: EM | Disposition: A | Discharge status: Home / Self Care | Payer: Self-pay | Source: Home / Self Care | Attending: Internal Medicine

## 2023-08-12 DIAGNOSIS — N179 Acute kidney failure, unspecified: Secondary | ICD-10-CM

## 2023-08-12 DIAGNOSIS — I5041 Acute combined systolic (congestive) and diastolic (congestive) heart failure: Secondary | ICD-10-CM

## 2023-08-12 DIAGNOSIS — K219 Gastro-esophageal reflux disease without esophagitis: Secondary | ICD-10-CM

## 2023-08-12 DIAGNOSIS — Z789 Other specified health status: Secondary | ICD-10-CM

## 2023-08-12 DIAGNOSIS — I5021 Acute systolic (congestive) heart failure: Secondary | ICD-10-CM

## 2023-08-12 DIAGNOSIS — E118 Type 2 diabetes mellitus with unspecified complications: Secondary | ICD-10-CM

## 2023-08-12 DIAGNOSIS — I5023 Acute on chronic systolic (congestive) heart failure: Secondary | ICD-10-CM

## 2023-08-12 DIAGNOSIS — R0602 Shortness of breath: Secondary | ICD-10-CM | POA: Diagnosis not present

## 2023-08-12 DIAGNOSIS — I10 Essential (primary) hypertension: Secondary | ICD-10-CM

## 2023-08-12 DIAGNOSIS — I2510 Atherosclerotic heart disease of native coronary artery without angina pectoris: Secondary | ICD-10-CM | POA: Diagnosis not present

## 2023-08-12 DIAGNOSIS — G4733 Obstructive sleep apnea (adult) (pediatric): Secondary | ICD-10-CM

## 2023-08-12 DIAGNOSIS — I480 Paroxysmal atrial fibrillation: Secondary | ICD-10-CM

## 2023-08-12 HISTORY — PX: LEFT HEART CATH AND CORONARY ANGIOGRAPHY: CATH118249

## 2023-08-12 LAB — BASIC METABOLIC PANEL
Anion gap: 16 — ABNORMAL HIGH (ref 5–15)
BUN: 18 mg/dL (ref 8–23)
CO2: 23 mmol/L (ref 22–32)
Calcium: 8.7 mg/dL — ABNORMAL LOW (ref 8.9–10.3)
Chloride: 103 mmol/L (ref 98–111)
Creatinine, Ser: 1.49 mg/dL — ABNORMAL HIGH (ref 0.61–1.24)
GFR, Estimated: 49 mL/min — ABNORMAL LOW (ref >60)
Glucose, Bld: 80 mg/dL (ref 70–99)
Potassium: 3.3 mmol/L — ABNORMAL LOW (ref 3.5–5.1)
Sodium: 142 mmol/L (ref 135–145)

## 2023-08-12 LAB — ECHOCARDIOGRAM COMPLETE
Area-P 1/2: 2.32 cm2
Calc EF: 31.6 %
Height: 70 in
MV VTI: 1.9 cm2
S' Lateral: 5.8 cm
Single Plane A2C EF: 28.3 %
Single Plane A4C EF: 35.6 %
Weight: 3680 [oz_av]

## 2023-08-12 LAB — PHOSPHORUS: Phosphorus: 3.7 mg/dL (ref 2.5–4.6)

## 2023-08-12 LAB — CBC
HCT: 41.5 % (ref 39.0–52.0)
Hemoglobin: 14.2 g/dL (ref 13.0–17.0)
MCH: 30.5 pg (ref 26.0–34.0)
MCHC: 34.2 g/dL (ref 30.0–36.0)
MCV: 89.2 fL (ref 80.0–100.0)
Platelets: 159 10*3/uL (ref 150–400)
RBC: 4.65 MIL/uL (ref 4.22–5.81)
RDW: 14.8 % (ref 11.5–15.5)
WBC: 6.5 10*3/uL (ref 4.0–10.5)
nRBC: 0 % (ref 0.0–0.2)

## 2023-08-12 LAB — GLUCOSE, CAPILLARY
Glucose-Capillary: 80 mg/dL (ref 70–99)
Glucose-Capillary: 122 mg/dL — ABNORMAL HIGH (ref 70–99)

## 2023-08-12 LAB — MAGNESIUM: Magnesium: 2.1 mg/dL (ref 1.7–2.4)

## 2023-08-12 LAB — CBG MONITORING, ED
Glucose-Capillary: 83 mg/dL (ref 70–99)
Glucose-Capillary: 169 mg/dL — ABNORMAL HIGH (ref 70–99)
Glucose-Capillary: 95 mg/dL (ref 70–99)

## 2023-08-12 LAB — ETHANOL: Alcohol, Ethyl (B): <10 mg/dL (ref <10)

## 2023-08-12 SURGERY — LEFT HEART CATH AND CORONARY ANGIOGRAPHY
Anesthesia: LOCAL

## 2023-08-12 MED ORDER — VERAPAMIL HCL 2.5 MG/ML IV SOLN
INTRAVENOUS | Status: DC | PRN
  Administered 2023-08-12: 10 mL via INTRA_ARTERIAL

## 2023-08-12 MED ORDER — ENOXAPARIN SODIUM 40 MG/0.4ML IJ SOSY
40.0000 mg | PREFILLED_SYRINGE | INTRAMUSCULAR | Status: DC
  Administered 2023-08-13: 40 mg via SUBCUTANEOUS
  Filled 2023-08-12: qty 0.4

## 2023-08-12 MED ORDER — SODIUM CHLORIDE 0.9% FLUSH: 3.0000 mL | INTRAVENOUS | Status: DC | PRN

## 2023-08-12 MED ORDER — INSULIN ASPART 100 UNIT/ML IJ SOLN: 0.0000 [IU] | Freq: Three times a day (TID) | INTRAMUSCULAR | Status: DC

## 2023-08-12 MED ORDER — VERAPAMIL HCL 2.5 MG/ML IV SOLN
INTRAVENOUS | Status: AC
  Filled 2023-08-12: qty 2

## 2023-08-12 MED ORDER — SODIUM CHLORIDE 0.9% FLUSH
3.0000 mL | Freq: Two times a day (BID) | INTRAVENOUS | Status: DC
  Administered 2023-08-13: 3 mL via INTRAVENOUS

## 2023-08-12 MED ORDER — LORAZEPAM 1 MG PO TABS: 1.0000 mg | ORAL_TABLET | ORAL | Status: DC | PRN

## 2023-08-12 MED ORDER — SODIUM CHLORIDE 0.9 % IV SOLN: INTRAVENOUS | Status: DC

## 2023-08-12 MED ORDER — IOHEXOL 350 MG/ML SOLN
INTRAVENOUS | Status: DC | PRN
  Administered 2023-08-12: 35 mL via INTRA_ARTERIAL

## 2023-08-12 MED ORDER — MIDAZOLAM HCL 2 MG/2ML IJ SOLN
INTRAMUSCULAR | Status: AC
  Filled 2023-08-12: qty 2

## 2023-08-12 MED ORDER — HEPARIN SODIUM (PORCINE) 1000 UNIT/ML IJ SOLN
INTRAMUSCULAR | Status: DC | PRN
  Administered 2023-08-12: 5000 [IU] via INTRAVENOUS

## 2023-08-12 MED ORDER — METOPROLOL TARTRATE 50 MG PO TABS
50.0000 mg | ORAL_TABLET | Freq: Two times a day (BID) | ORAL | Status: DC
  Administered 2023-08-12 (×2): 50 mg via ORAL
  Filled 2023-08-12: qty 1
  Filled 2023-08-12: qty 2

## 2023-08-12 MED ORDER — FENTANYL CITRATE (PF) 100 MCG/2ML IJ SOLN
INTRAMUSCULAR | Status: DC | PRN
  Administered 2023-08-12: 25 ug via INTRAVENOUS

## 2023-08-12 MED ORDER — LIDOCAINE HCL (PF) 1 % IJ SOLN
INTRAMUSCULAR | Status: AC
  Filled 2023-08-12: qty 30

## 2023-08-12 MED ORDER — PANTOPRAZOLE SODIUM 40 MG PO TBEC
40.0000 mg | DELAYED_RELEASE_TABLET | Freq: Every day | ORAL | Status: DC
  Administered 2023-08-12 – 2023-08-13 (×2): 40 mg via ORAL
  Filled 2023-08-12 (×2): qty 1

## 2023-08-12 MED ORDER — FOLIC ACID 1 MG PO TABS
1.0000 mg | ORAL_TABLET | Freq: Every day | ORAL | Status: DC
  Administered 2023-08-12 – 2023-08-13 (×2): 1 mg via ORAL
  Filled 2023-08-12 (×2): qty 1

## 2023-08-12 MED ORDER — MIDAZOLAM HCL 2 MG/2ML IJ SOLN
INTRAMUSCULAR | Status: DC | PRN
  Administered 2023-08-12: 1 mg via INTRAVENOUS

## 2023-08-12 MED ORDER — FENTANYL CITRATE (PF) 100 MCG/2ML IJ SOLN
INTRAMUSCULAR | Status: AC
  Filled 2023-08-12: qty 2

## 2023-08-12 MED ORDER — PERFLUTREN LIPID MICROSPHERE
1.0000 mL | INTRAVENOUS | Status: AC | PRN
  Administered 2023-08-12: 1 mL via INTRAVENOUS

## 2023-08-12 MED ORDER — TAMSULOSIN HCL 0.4 MG PO CAPS
0.4000 mg | ORAL_CAPSULE | Freq: Every day | ORAL | Status: DC
  Administered 2023-08-12: 0.4 mg via ORAL
  Filled 2023-08-12: qty 1

## 2023-08-12 MED ORDER — HEPARIN (PORCINE) IN NACL 1000-0.9 UT/500ML-% IV SOLN
INTRAVENOUS | Status: DC | PRN
  Administered 2023-08-12 (×2): 500 mL

## 2023-08-12 MED ORDER — POTASSIUM CHLORIDE CRYS ER 20 MEQ PO TBCR
40.0000 meq | EXTENDED_RELEASE_TABLET | Freq: Once | ORAL | Status: AC
  Administered 2023-08-12: 40 meq via ORAL
  Filled 2023-08-12: qty 2

## 2023-08-12 MED ORDER — ADULT MULTIVITAMIN W/MINERALS CH
1.0000 | ORAL_TABLET | Freq: Every day | ORAL | Status: DC
  Administered 2023-08-12 – 2023-08-13 (×2): 1 via ORAL
  Filled 2023-08-12 (×2): qty 1

## 2023-08-12 MED ORDER — LORAZEPAM 2 MG/ML IJ SOLN: 1.0000 mg | INTRAMUSCULAR | Status: DC | PRN

## 2023-08-12 MED ORDER — INSULIN ASPART 100 UNIT/ML IJ SOLN: 0.0000 [IU] | Freq: Every day | INTRAMUSCULAR | Status: DC

## 2023-08-12 MED ORDER — HEPARIN SODIUM (PORCINE) 1000 UNIT/ML IJ SOLN
INTRAMUSCULAR | Status: AC
  Filled 2023-08-12: qty 10

## 2023-08-12 MED ORDER — ASPIRIN 81 MG PO CHEW
81.0000 mg | CHEWABLE_TABLET | Freq: Once | ORAL | Status: AC
  Administered 2023-08-12: 81 mg via ORAL
  Filled 2023-08-12: qty 1

## 2023-08-12 MED ORDER — SODIUM CHLORIDE 0.9 % IV SOLN: 250.0000 mL | INTRAVENOUS | Status: DC | PRN

## 2023-08-12 MED ORDER — LIDOCAINE HCL (PF) 1 % IJ SOLN
INTRAMUSCULAR | Status: DC | PRN
  Administered 2023-08-12: 2 mL

## 2023-08-12 MED ORDER — HYDRALAZINE HCL 20 MG/ML IJ SOLN: 10.0000 mg | Freq: Three times a day (TID) | INTRAMUSCULAR | Status: DC | PRN

## 2023-08-12 SURGICAL SUPPLY — 8 items
CATH 5FR JL3.5 JR4 ANG PIG MP (CATHETERS) IMPLANT
DEVICE RAD COMP TR BAND LRG (VASCULAR PRODUCTS) IMPLANT
GLIDESHEATH SLEND SS 6F .021 (SHEATH) IMPLANT
GUIDEWIRE INQWIRE 1.5J.035X260 (WIRE) IMPLANT
INQWIRE 1.5J .035X260CM (WIRE) ×1
PACK CARDIAC CATHETERIZATION (CUSTOM PROCEDURE TRAY) ×1 IMPLANT
SET ATX-X65L (MISCELLANEOUS) IMPLANT
SHEATH PROBE COVER 6X72 (BAG) IMPLANT

## 2023-08-12 NOTE — Interval H&P Note (Signed)
 History and Physical Interval Note:  08/12/2023 5:11 PM  Christopher [name]  has presented today for surgery, with the diagnosis of CHF, low EF.  The various methods of treatment have been discussed with the patient and family. After consideration of risks, benefits and other options for treatment, the patient has consented to  Procedure(s): LEFT HEART CATH AND CORONARY ANGIOGRAPHY (N/A) as a surgical intervention.  The patient's history has been reviewed, patient examined, no change in status, stable for surgery.  I have reviewed the patient's chart and labs.  Questions were answered to the patient's satisfaction.    Cath Lab Visit (complete for each Cath Lab visit)  Clinical Evaluation Leading to the Procedure:   ACS: No.  Non-ACS:    Anginal Classification: CCS I  Anti-ischemic medical therapy: Minimal Therapy (1 class of medications)  Non-Invasive Test Results: No non-invasive testing performed  Prior CABG: No previous CABG       [name] 08/12/2023  5:11 PM

## 2023-08-12 NOTE — H&P (View-Only) (Signed)
 DAILY PROGRESS NOTE   Patient Name: Christopher [name] Date of Encounter: 08/12/2023 Cardiologist: Vinie JAYSON Maxcy, MD  Chief Complaint   Breathing is better today  Patient Profile   Christopher [name] is a 73 y.o. male with a hx of Chonic systolic congestive Heart Failure, coronary artery disease, CAD S/P PCI RCA 14, mitral valve anuloplasty status post 09/27/12, Post OP A-Fib in 14, ventricular fibrillation status post ICD in 11/18, cerebral vascular accident in 2022, hyperlipidemia, hypertension, who is being seen 08/11/2023 for the evaluation of shortness of breath at the request of Mario Blanch MD.   Subjective   Good diuresis overnight- he was almost 2L Negative- also had some incontinence and was found out of bed with some confusion. I was present during the echo today- it appears that LVEF is around 30-35% with regional WMA's.  Of note, a research echo in 2023 noted that there was severe stenosis of the RCA (just proximal to a previously placed stent), which was FFR positive, however, the patient denied angina - so I elected to manage it medically.  Objective   Vitals:   08/11/23 2345 08/12/23 0015 08/12/23 0614 08/12/23 1038  BP: (!) 169/72  (!) 162/90 (!) 138/100  Pulse:  77 74 89  Resp: 17  20 20   Temp: 98.5 F (36.9 C)  98.3 F (36.8 C) 98.4 F (36.9 C)  TempSrc: Oral  Oral Oral  SpO2: 100% 97% 100% 99%  Weight:      Height:        Intake/Output Summary (Last 24 hours) at 08/12/2023 1045 Last data filed at 08/12/2023 9383 Gross per 24 hour  Intake --  Output 1875 ml  Net -1875 ml   Filed Weights   08/10/23 2056  Weight: 104.3 kg    Physical Exam   General appearance: alert, no distress, and mildly obese Lungs: clear to auscultation bilaterally Heart: regular rate and rhythm Extremities: edema trace to 1+ bilateral Neurologic: Mental status: Alert, oriented, thought content appropriate  Inpatient Medications    Scheduled Meds:  atorvastatin   40 mg Oral Daily    clopidogrel   75 mg Oral Daily   fenofibrate   160 mg Oral Daily   folic acid   1 mg Oral Daily   furosemide   40 mg Intravenous BID   heparin  injection (subcutaneous)  5,000 Units Subcutaneous Q8H   insulin  aspart  0-15 Units Subcutaneous TID WC   insulin  aspart  0-5 Units Subcutaneous QHS   insulin  aspart  0-5 Units Subcutaneous QHS   insulin  aspart  0-9 Units Subcutaneous TID WC   metoprolol  tartrate  50 mg Oral BID   multivitamin with minerals  1 tablet Oral Daily   pantoprazole   40 mg Oral Daily   sodium chloride  flush  3 mL Intravenous Q12H   tamsulosin   0.4 mg Oral QPC supper   thiamine   200 mg Oral Daily    Continuous Infusions:   PRN Meds: acetaminophen  **OR** acetaminophen , albuterol , hydrALAZINE , LORazepam  **OR** LORazepam , ondansetron  **OR** ondansetron  (ZOFRAN ) IV, oxyCODONE , perflutren  lipid microspheres (DEFINITY ) IV suspension   Labs   Results for orders placed or performed during the hospital encounter of 08/10/23 (from the past 48 hours)  Basic metabolic panel     Status: Abnormal   Collection Time: 08/10/23  9:35 PM  Result Value Ref Range   Sodium 136 135 - 145 mmol/L   Potassium 3.9 3.5 - 5.1 mmol/L   Chloride 106 98 - 111 mmol/L   CO2 19 (L)  22 - 32 mmol/L   Glucose, Bld 114 (H) 70 - 99 mg/dL    Comment: Glucose reference range applies only to samples taken after fasting for at least 8 hours.   BUN 19 8 - 23 mg/dL   Creatinine, Ser 8.44 (H) 0.61 - 1.24 mg/dL   Calcium  9.0 8.9 - 10.3 mg/dL   GFR, Estimated 47 (L) >60 mL/min    Comment: (NOTE) Calculated using the CKD-EPI Creatinine Equation (2021)    Anion gap 11 5 - 15    Comment: Performed at [facility], [address], [place], [place] [address]  CBC     Status: Abnormal   Collection Time: 08/10/23  9:35 PM  Result Value Ref Range   WBC 7.5 4.0 - 10.5 K/uL   RBC 4.54 4.22 - 5.81 MIL/uL   Hemoglobin 13.6 13.0 - 17.0 g/dL   HCT 54.3 60.9 - 47.9 %   MCV 100.4 (H) 80.0 - 100.0 fL   MCH  30.0 26.0 - 34.0 pg   MCHC 29.8 (L) 30.0 - 36.0 g/dL   RDW 85.0 88.4 - 84.4 %   Platelets 171 150 - 400 K/uL   nRBC 0.0 0.0 - 0.2 %    Comment: Performed at [facility], [address] [place], [place] [address]  Troponin I (High Sensitivity)     Status: None   Collection Time: 08/10/23  9:35 PM  Result Value Ref Range   Troponin I (High Sensitivity) 12 <18 ng/L    Comment: (NOTE) Elevated high sensitivity troponin I (hsTnI) values and significant  changes across serial measurements may suggest ACS but many other  chronic and acute conditions are known to elevate hsTnI results.  Refer to the Links section for chest pain algorithms and additional  guidance. Performed at [facility], [address] [place], [place] [address]   Troponin I (High Sensitivity)     Status: None   Collection Time: 08/11/23 12:06 AM  Result Value Ref Range   Troponin I (High Sensitivity) 15 <18 ng/L    Comment: (NOTE) Elevated high sensitivity troponin I (hsTnI) values and significant  changes across serial measurements may suggest ACS but many other  chronic and acute conditions are known to elevate hsTnI results.  Refer to the Links section for chest pain algorithms and additional  guidance. Performed at [facility], [address] [place], [place] [address]   Brain natriuretic peptide     Status: Abnormal   Collection Time: 08/11/23 12:06 AM  Result Value Ref Range   B Natriuretic Peptide 2,414.3 (H) 0.0 - 100.0 pg/mL    Comment: Performed at [facility], [address], [place], [place] [address]  CBG monitoring, ED     Status: Abnormal   Collection Time: 08/11/23  4:20 PM  Result Value Ref Range   Glucose-Capillary 176 (H) 70 - 99 mg/dL    Comment: Glucose reference range applies only to samples taken after fasting for at least 8 hours.  CBG monitoring, ED     Status: None   Collection Time: 08/11/23  9:58 PM  Result Value Ref Range   Glucose-Capillary 92 70  - 99 mg/dL    Comment: Glucose reference range applies only to samples taken after fasting for at least 8 hours.  CBG monitoring, ED     Status: None   Collection Time: 08/12/23  6:08 AM  Result Value Ref Range   Glucose-Capillary 83 70 -  99 mg/dL    Comment: Glucose reference range applies only to samples taken after fasting for at least 8 hours.   Comment 1 Notify RN    Comment 2 Document in Chart   CBG monitoring, ED     Status: Abnormal   Collection Time: 08/12/23 10:14 AM  Result Value Ref Range   Glucose-Capillary 169 (H) 70 - 99 mg/dL    Comment: Glucose reference range applies only to samples taken after fasting for at least 8 hours.    ECG   N/A  Telemetry   Sinus rhythm with PVC's - Personally Reviewed  Radiology    DG Chest 2 View Result Date: 08/10/2023 CLINICAL DATA:  Shortness of breath EXAM: CHEST - 2 VIEW COMPARISON:  03/31/2020 FINDINGS: Mild cardiomegaly. Unchanged position of left chest wall single lead AICD lead. Small pleural effusions and mild pulmonary edema. IMPRESSION: Mild congestive heart failure. Electronically Signed   By: Franky Stanford M.D.   On: 08/10/2023 21:36    Cardiac Studies   Echo report pending  Assessment   Principal Problem:   SOB (shortness of breath) Active Problems:   Essential hypertension   Acute on chronic systolic CHF (congestive heart failure) (HCC)   AF (paroxysmal atrial fibrillation) (HCC)   Alcohol use   Coronary artery disease involving native coronary artery of native heart without angina pectoris   OSA on CPAP   GERD without esophagitis   Diabetes mellitus type 2 with complications (HCC)   AKI (acute kidney injury) (HCC)   Plan   Mr. 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 He had a suspected severe stenosis of the RCA, proximal to a previously placed stent in 2023 by a research CT scan (he was asymptomatic at the time),  so he was medically managed. This may be an issue in the decline of his LV function, in addition to his dietary non-compliance. Given this history, I would recommend cardiac catheterization to see if there is a possibility of improving perfusion which could improve his LVEF. He is able to lie flat today. I discussed the procedure with him, including risks, benefits and alternatives and he is agreeable to proceed.  Please keep NPO for cath today.  Informed Consent   Shared Decision Making/Informed Consent The risks [stroke (1 in 1000), death (1 in 1000), kidney failure [usually temporary] (1 in 500), bleeding (1 in 200), allergic reaction [possibly serious] (1 in 200)], benefits (diagnostic support and management of coronary artery disease) and alternatives of a cardiac catheterization were discussed in detail with Mr. Reading and he is willing to proceed.     Time Spent Directly with Patient:  I have spent a total of 35 minutes with the patient reviewing hospital notes, telemetry, EKGs, labs and examining the patient as well as establishing an assessment and plan that was discussed personally with the patient.  > 50% of time was spent in direct patient care.  Length of Stay:  LOS: 1 day   Vinie KYM Maxcy, MD, [facility], FACP  [place]  [facility]  Medical Director of the [facility] Lipid Disorders &  Cardiovascular Risk Reduction Clinic Diplomate of the American Board of Clinical Lipidology Attending Cardiologist  Direct Dial: [phone]  Fax: [phone]  Website:  [email or web address]  Vinie BROCKS Zanylah Hardie 08/12/2023, 10:45 AM

## 2023-08-12 NOTE — Evaluation (Signed)
 Occupational Therapy Evaluation and DC Summary  Patient Details Name: Christopher [name] MRN: [id number] DOB: [birth year] Today's Date: 08/12/2023   History of Present Illness Patient is 73 y.o. male presented to ED with progressive SOB and LE edema over past few weeks. Pt admitted for acute on chronic CHF. PMH significant for  Chronic systolic CHF ef 45% , ICM, CAD, h/o VF, h/o MV repair for endocarditis, h/o CVA, and HTN.   Clinical Impression   Pt close to functional baseline, he is Ind in ADL and per PT notes he is Mod I for ambulation. Focused session on education of CHF prevention/signs and energy conservation. Pt with little knowledge regarding his dietary restrictions, also highlighted this on CHF handout to reinforce carryover, also reinforced pursed lip breathing during activity. Pt overall has no more acute skilled OT or post acute OT needs.        If plan is discharge home, recommend the following: Other (comment) (prn)    Functional Status Assessment  Patient has not had a recent decline in their functional status  Equipment Recommendations  None recommended by OT    Recommendations for Other Services       Precautions / Restrictions Precautions Precautions: Fall Restrictions Weight Bearing Restrictions Per Provider Order: No      Mobility Bed Mobility Overal bed mobility: Modified Independent                  Transfers                          Balance Overall balance assessment: Needs assistance Sitting-balance support: Feet supported Sitting balance-Leahy Scale: Good                                     ADL either performed or assessed with clinical judgement   ADL Overall ADL's : Independent                                       General ADL Comments: Pt ind in ADLs, Mod I for ambulation per PT notes. Edcuated pt on CHF signs/prevention and energy conservation stratgies with an emphasis on pacing and pursed  lip breathing.     Vision         Perception         Praxis         Pertinent Vitals/Pain Pain Assessment Pain Assessment: No/denies pain     Extremity/Trunk Assessment Upper Extremity Assessment Upper Extremity Assessment: Overall WFL for tasks assessed   Lower Extremity Assessment Lower Extremity Assessment: Overall WFL for tasks assessed   Cervical / Trunk Assessment Cervical / Trunk Assessment: Other exceptions Cervical / Trunk Exceptions: habitus   Communication Communication Communication: No apparent difficulties   Cognition Arousal: Alert Behavior During Therapy: WFL for tasks assessed/performed Overall Cognitive Status: Within Functional Limits for tasks assessed                                       General Comments       Exercises     Shoulder Instructions      Home Living Family/patient expects to be discharged to:: Private residence Living Arrangements: Alone Available Help at Discharge:  Friend(s) (shirley friend for 10+ years) Type of Home: Apartment (condo) Home Access: Level entry     Home Layout: One level     Bathroom Shower/Tub: Tub/shower unit;Walk-in shower   Bathroom Toilet: Standard Bathroom Accessibility: Yes   Home Equipment: Agricultural [employer]ant (2 wheels);[place] - single point   Additional Comments: retired education administrator. has a 1 level condo on the 1st floor of the building.      Prior Functioning/Environment Prior Level of Function : Independent/Modified Independent (hasn't driven in a while, stays home)             Mobility Comments: amb in home with no AD, usses RW to go to mailbox ADLs Comments: orders groceries online they drop them off and he gets them from the porch. Ind        OT Problem List: Other (comment) (SOB)      OT Treatment/Interventions:      OT Goals(Current goals can be found in the care plan section) Acute Rehab OT Goals Patient Stated Goal: To go home OT Goal Formulation: With  patient Time For Goal Achievement: 08/26/23 Potential to Achieve Goals: Good  OT Frequency:      Co-evaluation              AM-PAC OT 6 Clicks Daily Activity     Outcome Measure Help from another person eating meals?: None Help from another person taking care of personal grooming?: None Help from another person toileting, which includes using toliet, bedpan, or urinal?: None Help from another person bathing (including washing, rinsing, drying)?: None Help from another person to put on and taking off regular upper body clothing?: None Help from another person to put on and taking off regular lower body clothing?: None 6 Click Score: 24   End of Session Nurse Communication: Mobility status  Activity Tolerance: Patient tolerated treatment well Patient left: in bed;with call bell/phone within reach  OT Visit Diagnosis: Other (comment) (SOB)                Time: 8792-8775 OT Time Calculation (min): 17 min Charges:  OT General Charges $OT Visit: 1 Visit OT Evaluation $OT Eval Low Complexity: 1 Low  08/12/2023  AB, OTR/L  Acute Rehabilitation Services  Office: [phone]   Christopher [name] 08/12/2023, 2:35 PM

## 2023-08-12 NOTE — TOC Initial Note (Signed)
 Transition of Care [facility]) - Inpatient Brief Assessment   Patient Details  Name: Christopher [name] MRN: [id number] Date of Birth: [birth year]  Transition of Care [facility] CM/SW Contact:    Luise JAYSON Pan, LCSWA Phone Number: 08/12/2023, 3:42 PM   Clinical Narrative: CSW completed substance use consult and spoke with pt. Pt declines substance use at this time.   Transition of Care Asessment: Insurance and Status: Insurance coverage has been reviewed Patient has primary care physician: Yes Home environment has been reviewed: Home Prior level of function:: Independent Prior/Current Home Services: No current home services Social Drivers of Health Review: SDOH reviewed no interventions necessary Readmission risk has been reviewed: Yes Transition of care needs: no transition of care needs at this time

## 2023-08-12 NOTE — Progress Notes (Signed)
 Progress Note   Patient: Christopher [name] FMW:[id number] DOB: May 16, 1951 DOA: 08/10/2023     1 DOS: the patient was seen and examined on 08/12/2023   Brief hospital admission course: As per H&P written by Dr. Tobie on 08/11/2023 Christopher [name] is a 73 y.o. male with past medical history  of  Chronic systolic CHF ef 45% , ICM, CAD, h/o VF, h/o MV repair for endocarditis, h/o CVA, and HTN  coming with progressive shortness of breath for past few weeks.  Patient states compliant with all his medications.  No reports of chest pain or extremity edema palpitations.  Chart reviewed patient is currently on dual antiplatelet therapy, 1 note states patient is on Eliquis  will have to verify if patient is indeed on Eliquis  or DAPT.  Patient had an AICD visit in December and did not have any shocks.    >>ED Course: In emergency room alert awake oriented, afebrile O2 sats of 99% on SpO2: 100 % O2 Flow Rate (L/min): 2 L/min   Assessment and Plan: * SOB (shortness of breath) -Secondary to CHF -Continue diuresis and follow cardiology recommendation -Follow clinical response.  Acute on chronic systolic CHF (congestive heart failure) (HCC) -Repeat echo demonstrating decreased ejection fraction -Patient improved with diuresis -Plan is for cardiac cath -Follow cardiology service recommendation for GDMT and further management. -Low-sodium diet, adequate hydration, strict I's and O's and daily weights discussed with patient.  Diabetes mellitus type 2 with complications (HCC) -Continue sliding scale insulin .  AKI (acute kidney injury) (HCC) -Appears to be stable -Continue following renal function trend especially with ongoing diuresis and anticipated cath -Minimize nephrotoxic agents.  AF (paroxysmal atrial fibrillation) (HCC) -Paroxysmal atrial fibrillation by history -Heart rate controlled and in sinus rhythm currently -Follow cardiology service recommendation.  Essential hypertension Vitals:   08/10/23  2052 08/11/23 0012 08/11/23 0528 08/11/23 0930  BP: (!) 160/96 (!) 135/104 125/79 (!) 158/69   08/11/23 1300 08/11/23 1330 08/11/23 1400 08/11/23 1425  BP: (!) 147/76 (!) 167/59 (!) 154/106 (!) 145/97   08/11/23 1700  BP: (!) 154/96  -Stable for the most part -Continue as needed hydralazine  and diuresis.  GERD without esophagitis -Continue PPI.  Coronary artery disease involving native coronary artery of native heart without angina pectoris -Continue Plavix  and statin -Case discussed with cardiology service with plan for cardiac cath later today.  Alcohol use -Reports no recent alcohol intake for couple of weeks -No active withdrawal symptoms appreciated -Continue CIWA protocol monitoring -Thiamine  and folic acid  will be provided.   OSA on CPAP -Continue CPAP nightly.    Subjective:  Reports no chest pain, no nausea, no vomiting.  Patient is present no palpitation no diaphoresis.  Present breathing is better after diuresis.  Physical Exam: Vitals:   08/12/23 1200 08/12/23 1354 08/12/23 1443 08/12/23 1718  BP: (!) 146/114 133/86 (!) 119/90   Pulse: (!) 54 73 (!) 131   Resp:  17 18   Temp:  98.6 F (37 C) 98 F (36.7 C)   TempSrc:   Oral   SpO2: 99% 99% 100% 99%  Weight:   102 kg   Height:   5' 10 (1.778 m)    General exam: Alert, awake, oriented x 3; no fever, reports improvement in his breathing and denies chest pain. Respiratory system:  decreased breath sounds at the bases; no using accessory muscle. Cardiovascular system: Rate controlled, no rubs, no gallops. Gastrointestinal system: Abdomen is slightly distended demonstrating increased abdominal girth.  No tenderness to palpation.  Positive bowel sounds.   Central nervous system: No focal neurological deficits. Extremities: No cyanosis or clubbing; 1+ edema appreciated bilaterally. Skin: No petechiae. Psychiatry: Judgement and insight appear normal. Mood & affect appropriate.   Data Reviewed: Magnesium :  2.1 Phosphorus: 3.7 Basic metabolic panel: Sodium 142, potassium 3.3, chloride 103, bicarb 23, BUN 18, creatinine 1.49 and GFR 49.  Family Communication: No family at bedside.  Disposition: Status is: Inpatient Remains inpatient appropriate because: Continue IV diuresis and follow recommendation by cardiology service after.   Planned Discharge Destination:  To be determined.  Time spent: 50 minutes  Author: Eric Nunnery, MD 08/12/2023 5:19 PM  For on call review [email or web address].

## 2023-08-12 NOTE — Progress Notes (Signed)
   08/12/23 2000  BiPAP/CPAP/SIPAP  BiPAP/CPAP/SIPAP Pt Type Adult  Reason BIPAP/CPAP not in use Non-compliant (pt refused)  BiPAP/CPAP /SiPAP Vitals  Resp 18  SpO2 96 %  Bilateral Breath Sounds Clear;Diminished  MEWS Score/Color  MEWS Score 0  MEWS Score Color Green

## 2023-08-12 NOTE — Progress Notes (Signed)
 DAILY PROGRESS NOTE   Patient Name: Christopher [name] Date of Encounter: 08/12/2023 Cardiologist: Vinie JAYSON Maxcy, MD  Chief Complaint   Breathing is better today  Patient Profile   Christopher [name] is a 73 y.o. male with a hx of Chonic systolic congestive Heart Failure, coronary artery disease, CAD S/P PCI RCA 14, mitral valve anuloplasty status post 09/27/12, Post OP A-Fib in 14, ventricular fibrillation status post ICD in 11/18, cerebral vascular accident in 2022, hyperlipidemia, hypertension, who is being seen 08/11/2023 for the evaluation of shortness of breath at the request of Mario Blanch MD.   Subjective   Good diuresis overnight- he was almost 2L Negative- also had some incontinence and was found out of bed with some confusion. I was present during the echo today- it appears that LVEF is around 30-35% with regional WMA's.  Of note, a research echo in 2023 noted that there was severe stenosis of the RCA (just proximal to a previously placed stent), which was FFR positive, however, the patient denied angina - so I elected to manage it medically.  Objective   Vitals:   08/11/23 2345 08/12/23 0015 08/12/23 0614 08/12/23 1038  BP: (!) 169/72  (!) 162/90 (!) 138/100  Pulse:  77 74 89  Resp: 17  20 20   Temp: 98.5 F (36.9 C)  98.3 F (36.8 C) 98.4 F (36.9 C)  TempSrc: Oral  Oral Oral  SpO2: 100% 97% 100% 99%  Weight:      Height:        Intake/Output Summary (Last 24 hours) at 08/12/2023 1045 Last data filed at 08/12/2023 9383 Gross per 24 hour  Intake --  Output 1875 ml  Net -1875 ml   Filed Weights   08/10/23 2056  Weight: 104.3 kg    Physical Exam   General appearance: alert, no distress, and mildly obese Lungs: clear to auscultation bilaterally Heart: regular rate and rhythm Extremities: edema trace to 1+ bilateral Neurologic: Mental status: Alert, oriented, thought content appropriate  Inpatient Medications    Scheduled Meds:  atorvastatin   40 mg Oral Daily    clopidogrel   75 mg Oral Daily   fenofibrate   160 mg Oral Daily   folic acid   1 mg Oral Daily   furosemide   40 mg Intravenous BID   heparin  injection (subcutaneous)  5,000 Units Subcutaneous Q8H   insulin  aspart  0-15 Units Subcutaneous TID WC   insulin  aspart  0-5 Units Subcutaneous QHS   insulin  aspart  0-5 Units Subcutaneous QHS   insulin  aspart  0-9 Units Subcutaneous TID WC   metoprolol  tartrate  50 mg Oral BID   multivitamin with minerals  1 tablet Oral Daily   pantoprazole   40 mg Oral Daily   sodium chloride  flush  3 mL Intravenous Q12H   tamsulosin   0.4 mg Oral QPC supper   thiamine   200 mg Oral Daily    Continuous Infusions:   PRN Meds: acetaminophen  **OR** acetaminophen , albuterol , hydrALAZINE , LORazepam  **OR** LORazepam , ondansetron  **OR** ondansetron  (ZOFRAN ) IV, oxyCODONE , perflutren  lipid microspheres (DEFINITY ) IV suspension   Labs   Results for orders placed or performed during the hospital encounter of 08/10/23 (from the past 48 hours)  Basic metabolic panel     Status: Abnormal   Collection Time: 08/10/23  9:35 PM  Result Value Ref Range   Sodium 136 135 - 145 mmol/L   Potassium 3.9 3.5 - 5.1 mmol/L   Chloride 106 98 - 111 mmol/L   CO2 19 (L)  22 - 32 mmol/L   Glucose, Bld 114 (H) 70 - 99 mg/dL    Comment: Glucose reference range applies only to samples taken after fasting for at least 8 hours.   BUN 19 8 - 23 mg/dL   Creatinine, Ser 8.44 (H) 0.61 - 1.24 mg/dL   Calcium  9.0 8.9 - 10.3 mg/dL   GFR, Estimated 47 (L) >60 mL/min    Comment: (NOTE) Calculated using the CKD-EPI Creatinine Equation (2021)    Anion gap 11 5 - 15    Comment: Performed at [facility], [address], [place], [place] [address]  CBC     Status: Abnormal   Collection Time: 08/10/23  9:35 PM  Result Value Ref Range   WBC 7.5 4.0 - 10.5 K/uL   RBC 4.54 4.22 - 5.81 MIL/uL   Hemoglobin 13.6 13.0 - 17.0 g/dL   HCT 54.3 60.9 - 47.9 %   MCV 100.4 (H) 80.0 - 100.0 fL   MCH  30.0 26.0 - 34.0 pg   MCHC 29.8 (L) 30.0 - 36.0 g/dL   RDW 85.0 88.4 - 84.4 %   Platelets 171 150 - 400 K/uL   nRBC 0.0 0.0 - 0.2 %    Comment: Performed at [facility], [address] [place], [place] [address]  Troponin I (High Sensitivity)     Status: None   Collection Time: 08/10/23  9:35 PM  Result Value Ref Range   Troponin I (High Sensitivity) 12 <18 ng/L    Comment: (NOTE) Elevated high sensitivity troponin I (hsTnI) values and significant  changes across serial measurements may suggest ACS but many other  chronic and acute conditions are known to elevate hsTnI results.  Refer to the Links section for chest pain algorithms and additional  guidance. Performed at [facility], [address] [place], [place] [address]   Troponin I (High Sensitivity)     Status: None   Collection Time: 08/11/23 12:06 AM  Result Value Ref Range   Troponin I (High Sensitivity) 15 <18 ng/L    Comment: (NOTE) Elevated high sensitivity troponin I (hsTnI) values and significant  changes across serial measurements may suggest ACS but many other  chronic and acute conditions are known to elevate hsTnI results.  Refer to the Links section for chest pain algorithms and additional  guidance. Performed at [facility], [address] [place], [place] [address]   Brain natriuretic peptide     Status: Abnormal   Collection Time: 08/11/23 12:06 AM  Result Value Ref Range   B Natriuretic Peptide 2,414.3 (H) 0.0 - 100.0 pg/mL    Comment: Performed at [facility], [address], [place], [place] [address]  CBG monitoring, ED     Status: Abnormal   Collection Time: 08/11/23  4:20 PM  Result Value Ref Range   Glucose-Capillary 176 (H) 70 - 99 mg/dL    Comment: Glucose reference range applies only to samples taken after fasting for at least 8 hours.  CBG monitoring, ED     Status: None   Collection Time: 08/11/23  9:58 PM  Result Value Ref Range   Glucose-Capillary 92 70  - 99 mg/dL    Comment: Glucose reference range applies only to samples taken after fasting for at least 8 hours.  CBG monitoring, ED     Status: None   Collection Time: 08/12/23  6:08 AM  Result Value Ref Range   Glucose-Capillary 83 70 -  99 mg/dL    Comment: Glucose reference range applies only to samples taken after fasting for at least 8 hours.   Comment 1 Notify RN    Comment 2 Document in Chart   CBG monitoring, ED     Status: Abnormal   Collection Time: 08/12/23 10:14 AM  Result Value Ref Range   Glucose-Capillary 169 (H) 70 - 99 mg/dL    Comment: Glucose reference range applies only to samples taken after fasting for at least 8 hours.    ECG   N/A  Telemetry   Sinus rhythm with PVC's - Personally Reviewed  Radiology    DG Chest 2 View Result Date: 08/10/2023 CLINICAL DATA:  Shortness of breath EXAM: CHEST - 2 VIEW COMPARISON:  03/31/2020 FINDINGS: Mild cardiomegaly. Unchanged position of left chest wall single lead AICD lead. Small pleural effusions and mild pulmonary edema. IMPRESSION: Mild congestive heart failure. Electronically Signed   By: Franky Stanford M.D.   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 He had a suspected severe stenosis of the RCA, proximal to a previously placed stent in 2023 by a research CT scan (he was asymptomatic at the time),  so he was medically managed. This may be an issue in the decline of his LV function, in addition to his dietary non-compliance. Given this history, I would recommend cardiac catheterization to see if there is a possibility of improving perfusion which could improve his LVEF. He is able to lie flat today. I discussed the procedure with him, including risks, benefits and alternatives and he is agreeable to proceed.  Please keep NPO for cath today.  Informed Consent   Shared Decision Making/Informed Consent The risks [stroke (1 in 1000), death (1 in 1000), kidney failure [usually temporary] (1 in 500), bleeding (1 in 200), allergic reaction [possibly serious] (1 in 200)], benefits (diagnostic support and management of coronary artery disease) and alternatives of a cardiac catheterization were discussed in detail with Christopher [name] and he is willing to proceed.     Time Spent Directly with Patient:  I have spent a total of 35 minutes with the patient reviewing hospital notes, telemetry, EKGs, labs and examining the patient as well as establishing an assessment and plan that was discussed personally with the patient.  > 50% of time was spent in direct patient care.  Length of Stay:  LOS: 1 day   Vinie KYM Maxcy, MD, [facility], FACP  [place]  [facility]  Medical Director of the [facility]  Card[facility]ovascular Risk Reduction [facility] Diplomate of the American Board of Clinical Lipidology Attending Cardiologist  Direct Dial: [phone]  Fax: [phone]  Website:  [email or web address]  Vinie BROCKS Zanylah Hardie 08/12/2023, 10:45 AM

## 2023-08-12 NOTE — ED Notes (Signed)
 Patient found dishelved on stretcher, states he was trying to use the urinal and doesn't know why he pulled all his monitor cords. Urine noted on the floor. Floor and bedside cleaned. Patient reoriented to surrounding.

## 2023-08-12 NOTE — Evaluation (Signed)
 Physical Therapy Evaluation Patient Details Name: Christopher [name] MRN: [id number] DOB: Aug 24, 1950 Today's Date: 08/12/2023  History of Present Illness  Patient is 73 y.o. male presented to ED with progressive SOB and LE edema over past few weeks. Pt admitted for acute on chronic CHF. PMH significant for  Chronic systolic CHF ef 45% , ICM, CAD, h/o VF, h/o MV repair for endocarditis, h/o CVA, and HTN.   Clinical Impression  Christopher [name] is 73 y.o. male admitted with above HPI and diagnosis. Patient is currently limited by functional impairments below (see PT problem list). Patient lives alone and is independent with occasional use of RW for longer distances at baseline. Currently he is mobilizing at Mod ind level for bed mob, supervision for transfers and gait with RW. VSS on RA with SpO2 >93% and pt denied SOB/DOE. Patient will benefit from continued skilled PT interventions to address impairments and progress independence with mobility. Acute PT will follow and progress as able.         If plan is discharge home, recommend the following: Assistance with cooking/housework;Help with stairs or ramp for entrance;Assist for transportation   Can travel by private vehicle        Equipment Recommendations None recommended by PT  Recommendations for Other Services       Functional Status Assessment Patient has had a recent decline in their functional status and demonstrates the ability to make significant improvements in function in a reasonable and predictable amount of time.     Precautions / Restrictions Precautions Precautions: Fall Restrictions Weight Bearing Restrictions Per Provider Order: No      Mobility  Bed Mobility Overal bed mobility: Needs Assistance Bed Mobility: Supine to Sit, Sit to Supine     Supine to sit: Modified independent (Device/Increase time) Sit to supine: Modified independent (Device/Increase time)   General bed mobility comments: HOB slighlty elevated,  some extra effort    Transfers Overall transfer level: Needs assistance Equipment used: Rolling walker (2 wheels) Transfers: Sit to/from Stand Sit to Stand: Supervision           General transfer comment: sup for safety, pt steady with rise and lower    Ambulation/Gait Ambulation/Gait assistance: Supervision Gait Distance (Feet): 150 Feet Assistive device: Rolling walker (2 wheels) Gait Pattern/deviations: Step-through pattern, Decreased stride length (decreased hip flex/foot clearance) Gait velocity: fair     General Gait Details: overall pt steady and maintained safe position to RW. no buckling of LE's. VSS on RA.  Stairs            Wheelchair Mobility     Tilt Bed    Modified Rankin (Stroke Patients Only)       Balance Overall balance assessment: Needs assistance Sitting-balance support: Feet supported Sitting balance-Leahy Scale: Good     Standing balance support: During functional activity, Reliant on assistive device for balance, Bilateral upper extremity supported Standing balance-Leahy Scale: Fair Standing balance comment: good static, RW for gait, anticipate pt will progress to no AD                             Pertinent Vitals/Pain      Home Living Family/patient expects to be discharged to:: Private residence Living Arrangements: Alone Available Help at Discharge: Friend(s) (shirley friend for 10+ years) Type of Home: Apartment (condo) Home Access: Level entry       Home Layout: One level Home Equipment: Agricultural Consultant (2 wheels);Cane -  single point Additional Comments: retired education administrator. has a 1 level condo on the 1st floor of the building.    Prior Function Prior Level of Function : Independent/Modified Independent (hasn't driven in a while, stays home)             Mobility Comments: amb in home with no AD, usses RW to go to mailbox ADLs Comments: orders groceries online they drop them off and he gets them from the  porch.     Extremity/Trunk Assessment   Upper Extremity Assessment Upper Extremity Assessment: Overall WFL for tasks assessed    Lower Extremity Assessment Lower Extremity Assessment: Overall WFL for tasks assessed    Cervical / Trunk Assessment Cervical / Trunk Assessment: Other exceptions Cervical / Trunk Exceptions: habitus  Communication   Communication Communication: No apparent difficulties  Cognition Arousal: Alert Behavior During Therapy: WFL for tasks assessed/performed Overall Cognitive Status: Within Functional Limits for tasks assessed                                          General Comments      Exercises     Assessment/Plan    PT Assessment Patient needs continued PT services  PT Problem List Decreased activity tolerance;Decreased mobility;Decreased balance;Cardiopulmonary status limiting activity;Obesity;Decreased knowledge of use of DME       PT Treatment Interventions DME instruction;Gait training;Stair training;Functional mobility training;Therapeutic activities;Therapeutic exercise;Balance training;Patient/family education    PT Goals (Current goals can be found in the Care Plan section)  Acute Rehab PT Goals Patient Stated Goal: return home as soon as able PT Goal Formulation: With patient Time For Goal Achievement: 08/26/23 Potential to Achieve Goals: Good    Frequency Min 1X/week     Co-evaluation               AM-PAC PT 6 Clicks Mobility  Outcome Measure Help needed turning from your back to your side while in a flat bed without using bedrails?: None Help needed moving from lying on your back to sitting on the side of a flat bed without using bedrails?: None Help needed moving to and from a bed to a chair (including a wheelchair)?: A Little Help needed standing up from a chair using your arms (e.g., wheelchair or bedside chair)?: A Little Help needed to walk in hospital room?: A Little Help needed climbing  3-5 steps with a railing? : A Little 6 Click Score: 20    End of Session Equipment Utilized During Treatment: Gait belt Activity Tolerance: Patient tolerated treatment well Patient left: in bed;with call bell/phone within reach Nurse Communication: Mobility status PT Visit Diagnosis: Muscle weakness (generalized) (M62.81);Difficulty in walking, not elsewhere classified (R26.2)    Time: 9179-9157 PT Time Calculation (min) (ACUTE ONLY): 22 min   Charges:   PT Evaluation $PT Eval Moderate Complexity: 1 Mod   PT General Charges $$ ACUTE PT VISIT: 1 Visit         Vernell DONEEN KLEIN, DPT Acute Rehabilitation Services Office [phone]  08/12/23 10:49 AM

## 2023-08-12 NOTE — ED Notes (Signed)
 Pts fluids stopped to be transferred upstairs.PT is on 61ml/hr.

## 2023-08-13 ENCOUNTER — Encounter (HOSPITAL_COMMUNITY): Payer: Self-pay | Admitting: Cardiology

## 2023-08-13 ENCOUNTER — Other Ambulatory Visit (HOSPITAL_COMMUNITY): Payer: Self-pay

## 2023-08-13 DIAGNOSIS — I480 Paroxysmal atrial fibrillation: Secondary | ICD-10-CM | POA: Diagnosis not present

## 2023-08-13 DIAGNOSIS — N179 Acute kidney failure, unspecified: Secondary | ICD-10-CM | POA: Diagnosis not present

## 2023-08-13 DIAGNOSIS — I10 Essential (primary) hypertension: Secondary | ICD-10-CM | POA: Diagnosis not present

## 2023-08-13 DIAGNOSIS — R0602 Shortness of breath: Secondary | ICD-10-CM | POA: Diagnosis not present

## 2023-08-13 DIAGNOSIS — I2510 Atherosclerotic heart disease of native coronary artery without angina pectoris: Secondary | ICD-10-CM

## 2023-08-13 DIAGNOSIS — I5023 Acute on chronic systolic (congestive) heart failure: Secondary | ICD-10-CM | POA: Diagnosis not present

## 2023-08-13 LAB — GLUCOSE, CAPILLARY
Glucose-Capillary: 97 mg/dL (ref 70–99)
Glucose-Capillary: 101 mg/dL — ABNORMAL HIGH (ref 70–99)

## 2023-08-13 LAB — BASIC METABOLIC PANEL
Anion gap: 9 (ref 5–15)
BUN: 18 mg/dL (ref 8–23)
CO2: 28 mmol/L (ref 22–32)
Calcium: 8.3 mg/dL — ABNORMAL LOW (ref 8.9–10.3)
Chloride: 102 mmol/L (ref 98–111)
Creatinine, Ser: 1.4 mg/dL — ABNORMAL HIGH (ref 0.61–1.24)
GFR, Estimated: 53 mL/min — ABNORMAL LOW (ref >60)
Glucose, Bld: 130 mg/dL — ABNORMAL HIGH (ref 70–99)
Potassium: 3.3 mmol/L — ABNORMAL LOW (ref 3.5–5.1)
Sodium: 139 mmol/L (ref 135–145)

## 2023-08-13 MED ORDER — SPIRONOLACTONE 12.5 MG HALF TABLET
12.5000 mg | ORAL_TABLET | Freq: Every day | ORAL | Status: DC
  Administered 2023-08-13: 12.5 mg via ORAL
  Filled 2023-08-13: qty 1

## 2023-08-13 MED ORDER — DAPAGLIFLOZIN PROPANEDIOL 10 MG PO TABS
10.0000 mg | ORAL_TABLET | Freq: Every day | ORAL | Status: DC
  Administered 2023-08-13: 10 mg via ORAL
  Filled 2023-08-13: qty 1

## 2023-08-13 MED ORDER — POTASSIUM CHLORIDE CRYS ER 20 MEQ PO TBCR
40.0000 meq | EXTENDED_RELEASE_TABLET | Freq: Once | ORAL | Status: AC
  Administered 2023-08-13: 40 meq via ORAL
  Filled 2023-08-13: qty 2

## 2023-08-13 MED ORDER — FUROSEMIDE 40 MG PO TABS
40.0000 mg | ORAL_TABLET | Freq: Every day | ORAL | 0 refills | Status: DC
  Filled 2023-08-13: qty 30, 30d supply, fill #0

## 2023-08-13 MED ORDER — FUROSEMIDE 40 MG PO TABS
40.0000 mg | ORAL_TABLET | Freq: Every day | ORAL | Status: DC
  Administered 2023-08-13: 40 mg via ORAL
  Filled 2023-08-13: qty 1

## 2023-08-13 MED ORDER — METOPROLOL SUCCINATE ER 100 MG PO TB24
100.0000 mg | ORAL_TABLET | Freq: Every day | ORAL | Status: DC
  Administered 2023-08-13: 100 mg via ORAL
  Filled 2023-08-13: qty 1

## 2023-08-13 MED ORDER — SACUBITRIL-VALSARTAN 97-103 MG PO TABS
1.0000 | ORAL_TABLET | Freq: Two times a day (BID) | ORAL | Status: DC
  Administered 2023-08-13: 1 via ORAL
  Filled 2023-08-13: qty 1

## 2023-08-13 NOTE — Discharge Summary (Signed)
 Physician Discharge Summary   Patient: Christopher [name] MRN: [id number] DOB: 10/07/1950  Admit date:     08/10/2023  Discharge date: 08/13/23  Discharge Physician: [name]   PCP: [name], MD   Recommendations at discharge:    Patient has been placed on furosemide  40 mg po daily for diuresis.  Continue guideline medical therapy for heart failure with Entresto , spironolactone , SGLT 2 inh. Plan to follow up with Dr Rollene in 7 to 10 days. Follow up with Cardiology as scheduled.   Discharge Diagnoses: Principal Problem:   Acute on chronic systolic CHF (congestive heart failure) (HCC) Active Problems:   AKI (acute kidney injury) (HCC)   AF (paroxysmal atrial fibrillation) (HCC)   Essential hypertension   Coronary artery disease involving native coronary artery of native heart without angina pectoris   GERD without esophagitis   Diabetes mellitus type 2 with complications (HCC)   Alcohol use   OSA on CPAP  Resolved Problems:   * No resolved hospital problems. [facility] Course: Christopher [name] was admitted to the hospital with the working diagnosis of acute on chronic heart failure exacerbation.   73 yo male with the past medical history of heart failure, coronary artery disease, history of endocarditis, now sp MV repair, history of CVA, and hypertension who presented with progressive dyspnea. On her initial physical examination his blood pressure was 154/106, 145/97, HR 77, RR 19 and 02 saturation 97%, lungs with bilateral rales with no wheezing, heart with S1 and S2 present and regular with no gallops, or rubs, abdomen with no distention and positive lower extremity edema. \  Na 136, K 3,9 Cl 106, glucose 114 bun 19 cr 1,55  BNP 2,414 High sensitive troponin 12 and 15  Wbc 7,5 hgb 13,6 plt 171   Chest radiograph with hypoinflation, positive cardiomegaly, with bilateral hilar vascular congestion, bilateral interstitial infiltrates, pacemaker defibrillator  in place with one right ventricular lead in place.   EKG 69 bpm, right axis deviation, right bundle branch block, qtc 505, sinus rhythm with 1st degree AV block, no significant ST segment or T wave changes, positive PVC.   Patient was placed on IV furosemide  for diuresis with improvement in his symptoms.  02/06 cardiac catheterization with mild non obstructive coronary artery disease.  Normal left ventricle end diastolic pressure at 14 mmHg.   02/07 patient feeling back to his baseline, plan for discharge home to day and follow up as outpatient.  Continue diuresis with furosemide  and continue guideline directed medical therapy for heart failure.      Assessment and Plan: * Acute on chronic systolic CHF (congestive heart failure) (HCC) Echocardiogram with reduced LV systolic function with EF 30 to 35%, global hypokinesis, mild concentric LVH, RV systolic function preserved, sp mitral annuloplasty, mild MR.   Patient was placed on furosemide  for diuresis, negative fluid balance was achieved, -4,225 ml, with significant improvement in his symptoms.   Patient will continue diuresis with furosemide  40 mg po daily.  Continue with guideline directed medical therapy with spironolactone , SGLT 2 inh, metoprolol  and Entresto .  Follow up as outpatient with Cardiology and Primary Care.   AKI (acute kidney injury) (HCC) CKD stage 3a, hypokalemia.   Volume status has improved, at the time of his discharge his serum cr is 1,40 with K at 3,3 and serum bicarbonate at 28  Na 139   He will receive 40 meq Kcl prior to his discharge.  Continue diuresis with furosemide , spironolactone  and SGLT 2  inh.  Follow up renal function and electrolytes as outpatient in 7 days.   AF (paroxysmal atrial fibrillation) (HCC) Continue rate control with metoprolol . Patient is not on apixaban , continue with clopidogrel  for now.   Essential hypertension Continue blood pressure control with Entresto  and metoprolol .   Continue diuretic regimen.   Coronary artery disease involving native coronary artery of native heart without angina pectoris No acute coronary syndrome, plan to continue clopidogrel  and statin therapy.  Continue blood pressure control.   GERD without esophagitis Continue with proton pump inhibitor.   Diabetes mellitus type 2 with complications [facility]) Patient was placed on insulin  sliding scale for glucose cover and monitoring. His glucose has remained stable.  At home will resume his usual diabetic regimen.    Alcohol use Patient has no signs of alcohol withdrawal syndrome.     OSA on CPAP CPAP at home.          Consultants: Cardiology  Procedures performed: cardiac catheterization  Disposition: Home Diet recommendation:  Cardiac and Carb modified diet DISCHARGE MEDICATION: Allergies as of 08/13/2023       Reactions   Codeine Other (See Comments)   Causes bad constipation   Dust Mite Extract Cough   Pollen Extract Cough        Medication List     TAKE these medications    Accu-Chek Guide test strip Generic drug: glucose blood USE AS INSTRUCTED   Accu-Chek Softclix Lancets lancets Use as instructed   acetaminophen  500 MG tablet Commonly known as: TYLENOL  Take 500 mg by mouth every 6 (six) hours as needed for mild pain (pain score 1-3) or headache.   atorvastatin  40 MG tablet Commonly known as: LIPITOR Take 1 tablet (40 mg total) by mouth daily.   blood glucose meter kit and supplies Kit Dispense based on patient and insurance preference. Use up to four times daily as directed.   cetirizine 10 MG tablet Commonly known as: ZYRTEC Take 10 mg by mouth daily.   cholecalciferol  25 MCG (1000 UNIT) tablet Commonly known as: VITAMIN D3 Take 1 tablet by mouth daily.   clopidogrel  75 MG tablet Commonly known as: PLAVIX  TAKE 1 TABLET BY MOUTH DAILY   Entresto  97-103 MG Generic drug: sacubitril -valsartan  TAKE 1 TABLET BY MOUTH TWICE A DAY   Farxiga   10 MG Tabs tablet Generic drug: dapagliflozin  propanediol TAKE 1 TABLET BY MOUTH EVERY DAY BEFORE BREAKFAST   fenofibrate  145 MG tablet Commonly known as: TRICOR  Take 1 tablet (145 mg total) by mouth daily.   fluticasone  50 MCG/ACT nasal spray Commonly known as: FLONASE  USE 2 SPRAYS IN BOTH NOSTRILS  DAILY   furosemide  40 MG tablet Commonly known as: LASIX  Take 1 tablet (40 mg total) by mouth daily. Start taking on: August 14, 2023   Insulin  Lispro Prot & Lispro (75-25) 100 UNIT/ML Kwikpen Commonly known as: HumaLOG  Mix 75/25 KwikPen Inject 18 Units into the skin in the morning and at bedtime.   metFORMIN  1000 MG tablet Commonly known as: GLUCOPHAGE  TAKE 1 TABLET BY MOUTH TWICE  DAILY WITH A MEAL   metoprolol  succinate 100 MG 24 hr tablet Commonly known as: TOPROL -XL Take 1 tablet (100 mg total) by mouth daily.   OVER THE COUNTER MEDICATION 1 tablet in the morning and at bedtime. [facility] - Restore multivitamin   PEN NEEDLES 31GX5/16 31G X 8 MM Misc Use twice a day for insulin  injection, E11.9   spironolactone  25 MG tablet Commonly known as: ALDACTONE  TAKE ONE-HALF TABLET BY MOUTH  DAILY   tamsulosin  0.4 MG Caps capsule Commonly known as: FLOMAX  Take 1 capsule (0.4 mg total) by mouth daily.        Follow-up Information     Janene Boer, [place] Follow up.   Specialties: Cardiology, Radiology Why: Thursday Aug 26, 2023 Appt at 8:25 AM (25 min) Contact information: [address] [place] [address] [phone]         [facility]. Go in 7 day(s).   Specialty: Cardiology Why: Hospital follow up 08/20/2023 @ 3 pm PLEASE bring a current medication list to appointment FREE valet parking, Entrance C, off [employer] information: [address] [place]  [phone]        [facility] Follow up.    Specialty: Rehabilitation Why: they will call you to set up and coordinate apt times with you.  if you do not hear from them in 3 business days please give them a call. thank you Contact information: [address] [place]  [phone]               Discharge Exam: Filed Weights   08/10/23 2056 08/12/23 1443 08/13/23 9391  Weight: 104.3 kg 102 kg 101.8 kg   BP 135/83 (BP Location: Right Arm)   Pulse 70   Temp 97.8 F (36.6 C) (Oral)   Resp 19   Ht 5' 10 (1.778 m)   Wt 101.8 kg   SpO2 96%   BMI 32.20 kg/m   Patient is feeling well, no chest pain, no dyspnea, no PND or orthopnea.   Neurology awake and alert ENT with mild pallor Cardiovascular with S1 and S2 present and regular, positive systolic murmur at the apex, no JVD.  Respiratory with no rales or wheezing, no rhonchi Abdomen with no distention  No lower extremity edema   Condition at discharge: stable  The results of significant diagnostics from this hospitalization (including imaging, microbiology, ancillary and laboratory) are listed below for reference.   Imaging Studies: CARDIAC CATHETERIZATION Result Date: 08/12/2023   Ramus lesion is 30% stenosed.   Prox RCA lesion is 30% stenosed.   Previously placed Dist RCA stent of unknown type is  widely patent.   LV end diastolic pressure is normal. Mild nonobstructive CAD. Normal LVEDP 14 mm Hg Medical management   ECHOCARDIOGRAM COMPLETE Result Date: 08/12/2023    ECHOCARDIOGRAM REPORT   Patient Name:   Christopher [name] Date of Exam: 08/12/2023 Medical Rec #:  [id number]      Height:       70.0 in Accession #:    [id number]     Weight:       230.0 lb Date of Birth:  1950/07/22      BSA:          2.215 m Patient Age:    72 years       BP:           162/90 mmHg Patient Gender: M              HR:           98 bpm. Exam Location:  Inpatient Procedure: 2D Echo, Cardiac Doppler, Color Doppler and Intracardiac            Opacification Agent  Indications:    CHF-Acute Systolic 428.21 / I50.21  History:        Patient has prior history of  Echocardiogram examinations, most                 recent 05/31/2023. CHF, Signs/Symptoms:Shortness of Breath and                 Chest Pain; Risk Factors:Hypertension and Diabetes.                  Mitral Valve: valve is present in the mitral position.  Sonographer:    Lanell Maduro Referring Phys: MARIO GAILS PATEL IMPRESSIONS  1. Left ventricular ejection fraction, by estimation, is 30 to 35%. The left ventricle has moderately decreased function. The left ventricle demonstrates global hypokinesis. There is mild concentric left ventricular hypertrophy. Left ventricular diastolic function could not be evaluated. Elevated left ventricular end-diastolic pressure.  2. Right ventricular systolic function is normal. The right ventricular size is normal.  3. Status post mitral annuloplasty. Mean gradient 3 mmHG at 98 bpm at, at risk for functional mitral stenosis. The mitral valve is normal in structure. Mild mitral valve regurgitation. No evidence of mitral stenosis. The mean mitral valve gradient is 3.0 mmHg. There is a present in the mitral position.  4. The aortic valve is normal in structure. Aortic valve regurgitation is not visualized. No aortic stenosis is present.  5. The inferior vena cava is normal in size with greater than 50% respiratory variability, suggesting right atrial pressure of 3 mmHg. FINDINGS  Left Ventricle: Left ventricular ejection fraction, by estimation, is 30 to 35%. The left ventricle has moderately decreased function. The left ventricle demonstrates global hypokinesis. Definity  contrast agent was given IV to delineate the left ventricular endocardial borders. The left ventricular internal cavity size was normal in size. There is mild concentric left ventricular hypertrophy. Abnormal (paradoxical) septal motion consistent with post-operative status. Left ventricular diastolic function could not be  evaluated due to mitral valve repair. Left ventricular diastolic function could not be evaluated. Elevated left ventricular end-diastolic pressure. Right Ventricle: The right ventricular size is normal. No increase in right ventricular wall thickness. Right ventricular systolic function is normal. Left Atrium: Left atrial size was normal in size. Right Atrium: Right atrial size was normal in size. Pericardium: There is no evidence of pericardial effusion. Presence of epicardial fat layer. Mitral Valve: Status post mitral annuloplasty. Mean gradient 3 mmHG at 98 bpm at, at risk for functional mitral stenosis. The mitral valve is normal in structure. Mild mitral valve regurgitation. There is a present in the mitral position. No evidence of mitral valve stenosis. MV peak gradient, 6.7 mmHg. The mean mitral valve gradient is 3.0 mmHg. Tricuspid Valve: The tricuspid valve is normal in structure. Tricuspid valve regurgitation is not demonstrated. No evidence of tricuspid stenosis. Aortic Valve: The aortic valve is normal in structure. Aortic valve regurgitation is not visualized. No aortic stenosis is present. Pulmonic Valve: The pulmonic valve was not well visualized. Pulmonic valve regurgitation is mild. No evidence of pulmonic stenosis. Aorta: The aortic root is normal in size and structure. Venous: The inferior vena cava is normal in size with greater than 50% respiratory variability, suggesting right atrial pressure of 3 mmHg. IAS/Shunts: No atrial level shunt detected by color flow Doppler.  LEFT VENTRICLE PLAX 2D LVIDd:         6.50 cm      Diastology LVIDs:         5.80 cm      LV e' medial:    4.03 cm/s LV PW:  1.20 cm      LV E/e' medial:  40.7 LV IVS:        1.20 cm      LV e' lateral:   4.79 cm/s LVOT diam:     2.70 cm      LV E/e' lateral: 34.2 LV SV:         91 LV SV Index:   41 LVOT Area:     5.73 cm  LV Volumes (MOD) LV vol d, MOD A2C: 198.0 ml LV vol d, MOD A4C: 194.0 ml LV vol s, MOD A2C: 142.0  ml LV vol s, MOD A4C: 125.0 ml LV SV MOD A2C:     56.0 ml LV SV MOD A4C:     194.0 ml LV SV MOD BP:      62.3 ml RIGHT VENTRICLE             IVC RV Basal diam:  4.20 cm     IVC diam: 1.60 cm RV Mid diam:    2.30 cm RV S prime:     15.50 cm/s TAPSE (M-mode): 3.8 cm LEFT ATRIUM             Index        RIGHT ATRIUM           Index LA diam:        3.90 cm 1.76 cm/m   RA Area:     14.60 cm LA Vol (A2C):   42.3 ml 19.10 ml/m  RA Volume:   34.50 ml  15.58 ml/m LA Vol (A4C):   46.7 ml 21.08 ml/m LA Biplane Vol: 47.7 ml 21.53 ml/m  AORTIC VALVE LVOT Vmax:   97.75 cm/s LVOT Vmean:  62.250 cm/s LVOT VTI:    0.159 m  AORTA Ao Root diam: 3.30 cm Ao Asc diam:  3.40 cm MITRAL VALVE MV Area (PHT): 2.32 cm     SHUNTS MV Area VTI:   1.90 cm     Systemic VTI:  0.16 m MV Peak grad:  6.7 mmHg     Systemic Diam: 2.70 cm MV Mean grad:  3.0 mmHg MV Vmax:       1.29 m/s MV Vmean:      78.4 cm/s MV Decel Time: 327 msec MV E velocity: 164.00 cm/s MV A velocity: 93.60 cm/s MV E/A ratio:  1.75 Kardie Tobb DO Electronically signed by Dub Huntsman DO Signature Date/Time: 08/12/2023/11:09:15 AM    Final    DG Chest 2 View Result Date: 08/10/2023 CLINICAL DATA:  Shortness of breath EXAM: CHEST - 2 VIEW COMPARISON:  03/31/2020 FINDINGS: Mild cardiomegaly. Unchanged position of left chest wall single lead AICD lead. Small pleural effusions and mild pulmonary edema. IMPRESSION: Mild congestive heart failure. Electronically Signed   By: Franky Stanford M.D.   On: 08/10/2023 21:36    Microbiology: Results for orders placed or performed during the hospital encounter of 02/14/21  Resp Panel by RT-PCR (Flu A&B, Covid) Nasopharyngeal Swab     Status: None   Collection Time: 02/14/21 10:16 PM   Specimen: Nasopharyngeal Swab; Nasopharyngeal(NP) swabs in vial transport medium  Result Value Ref Range Status   SARS Coronavirus 2 by RT PCR NEGATIVE NEGATIVE Final    Comment: (NOTE) SARS-CoV-2 target nucleic acids are NOT DETECTED.  The  SARS-CoV-2 RNA is generally detectable in upper respiratory specimens during the acute phase of infection. The lowest concentration of SARS-CoV-2 viral copies this assay can detect is 138 copies/mL. A negative result does  not preclude SARS-Cov-2 infection and should not be used as the sole basis for treatment or other patient management decisions. A negative result may occur with  improper specimen collection/handling, submission of specimen other than nasopharyngeal swab, presence of viral mutation(s) within the areas targeted by this assay, and inadequate number of viral copies(<138 copies/mL). A negative result must be combined with clinical observations, patient history, and epidemiological information. The expected result is Negative.  Fact Sheet for Patients:  bloggercourse.com  Fact Sheet for Healthcare Providers:  seriousbroker.it  This test is no t yet approved or cleared by the [place]  FDA and  has been authorized for detection and/or diagnosis of SARS-CoV-2 by FDA under an Emergency Use Authorization (EUA). This EUA will remain  in effect (meaning this test can be used) for the duration of the COVID-19 declaration under Section 564(b)(1) of the Act, 21 U.S.C.section 360bbb-3(b)(1), unless the authorization is terminated  or revoked sooner.       Influenza A by PCR NEGATIVE NEGATIVE Final   Influenza B by PCR NEGATIVE NEGATIVE Final    Comment: (NOTE) The Xpert Xpress SARS-CoV-2/FLU/RSV plus assay is intended as an aid in the diagnosis of influenza from Nasopharyngeal swab specimens and should not be used as a sole basis for treatment. Nasal washings and aspirates are unacceptable for Xpert Xpress SARS-CoV-2/FLU/RSV testing.  Fact Sheet for Patients: bloggercourse.com  Fact Sheet for Healthcare Providers: seriousbroker.it  This test is not yet approved or  cleared by the [place]  FDA and has been authorized for detection and/or diagnosis of SARS-CoV-2 by FDA under an Emergency Use Authorization (EUA). This EUA will remain in effect (meaning this test can be used) for the duration of the COVID-19 declaration under Section 564(b)(1) of the Act, 21 U.S.C. section 360bbb-3(b)(1), unless the authorization is terminated or revoked.  Performed at [facility], [address], [place], [place] [address]     Labs: CBC: Recent Labs  Lab 08/10/23 2135 08/12/23 1837  WBC 7.5 6.5  HGB 13.6 14.2  HCT 45.6 41.5  MCV 100.4* 89.2  PLT 171 159   Basic Metabolic Panel: Recent Labs  Lab 08/10/23 2135 08/12/23 1017 08/13/23 0225  NA 136 142 139  K 3.9 3.3* 3.3*  CL 106 103 102  CO2 19* 23 28  GLUCOSE 114* 80 130*  BUN 19 18 18   CREATININE 1.55* 1.49* 1.40*  CALCIUM  9.0 8.7* 8.3*  MG  --  2.1  --   PHOS  --  3.7  --    Liver Function Tests: No results for input(s): AST, ALT, ALKPHOS, BILITOT, PROT, ALBUMIN  in the last 168 hours. CBG: Recent Labs  Lab 08/12/23 1239 08/12/23 1814 08/12/23 2112 08/13/23 0610 08/13/23 1110  GLUCAP 95 80 122* 97 101*    Discharge time spent: greater than 30 minutes.  Signed: Elidia Toribio Furnace, MD [facility] 08/13/2023

## 2023-08-13 NOTE — Plan of Care (Signed)
   Problem: Coping: Goal: Ability to adjust to condition or change in health will improve Outcome: Progressing   Problem: Fluid Volume: Goal: Ability to maintain a balanced intake and output will improve Outcome: Progressing

## 2023-08-13 NOTE — Assessment & Plan Note (Signed)
 Patient has no signs of alcohol withdrawal syndrome.

## 2023-08-13 NOTE — Progress Notes (Addendum)
 DAILY PROGRESS NOTE   Patient Name: Christopher [name] Date of Encounter: 08/13/2023 Cardiologist: Vinie JAYSON Maxcy, MD  Chief Complaint   No complaints  Patient Profile   Christopher [name] is a 73 y.o. male with a hx of Chonic systolic congestive Heart Failure, coronary artery disease, CAD S/P PCI RCA 14, mitral valve anuloplasty status post 09/27/12, Post OP A-Fib in 14, ventricular fibrillation status post ICD in 11/18, cerebral vascular accident in 2022, hyperlipidemia, hypertension, who is being seen 08/11/2023 for the evaluation of shortness of breath at the request of Mario Blanch MD.   Subjective   Net negative another 2.3L yesterday- overall 4L negative. LHC yesterday showed patent distal RCA stent - 30% ramus and proximal RCA stenoses, top normal LVEDP. Creatinine improved at 1.4.  Objective   Vitals:   08/12/23 2045 08/12/23 2048 08/13/23 0111 08/13/23 0608  BP:  (!) 151/99 (!) 183/81 (!) 118/57  Pulse:   64 (!) 54  Resp:  18 16 17   Temp: 98 F (36.7 C) 98 F (36.7 C) 97.8 F (36.6 C) 97.8 F (36.6 C)  TempSrc: Oral Oral Oral Oral  SpO2:  96% 100% 100%  Weight:    101.8 kg  Height:        Intake/Output Summary (Last 24 hours) at 08/13/2023 0840 Last data filed at 08/13/2023 9175 Gross per 24 hour  Intake --  Output 2550 ml  Net -2550 ml   Filed Weights   08/10/23 2056 08/12/23 1443 08/13/23 9391  Weight: 104.3 kg 102 kg 101.8 kg    Physical Exam   General appearance: alert, no distress, and mildly obese Lungs: clear to auscultation bilaterally Heart: regular rate and rhythm Extremities: edema trace bilateral Neurologic: Mental status: Alert, oriented, thought content appropriate  Inpatient Medications    Scheduled Meds:  atorvastatin   40 mg Oral Daily   clopidogrel   75 mg Oral Daily   enoxaparin  (LOVENOX ) injection  40 mg Subcutaneous Q24H   fenofibrate   160 mg Oral Daily   folic acid   1 mg Oral Daily   furosemide   40 mg Intravenous BID   heparin   injection (subcutaneous)  5,000 Units Subcutaneous Q8H   insulin  aspart  0-15 Units Subcutaneous TID WC   metoprolol  tartrate  50 mg Oral BID   multivitamin with minerals  1 tablet Oral Daily   pantoprazole   40 mg Oral Daily   potassium chloride   40 mEq Oral Once   sodium chloride  flush  3 mL Intravenous Q12H   sodium chloride  flush  3 mL Intravenous Q12H   spironolactone   12.5 mg Oral Daily   tamsulosin   0.4 mg Oral QPC supper   thiamine   200 mg Oral Daily    Continuous Infusions:  sodium chloride       PRN Meds: sodium chloride , acetaminophen  **OR** acetaminophen , albuterol , hydrALAZINE , LORazepam  **OR** LORazepam , ondansetron  **OR** ondansetron  (ZOFRAN ) IV, oxyCODONE , sodium chloride  flush   Labs   Results for orders placed or performed during the hospital encounter of 08/10/23 (from the past 48 hours)  CBG monitoring, ED     Status: Abnormal   Collection Time: 08/11/23  4:20 PM  Result Value Ref Range   Glucose-Capillary 176 (H) 70 - 99 mg/dL    Comment: Glucose reference range applies only to samples taken after fasting for at least 8 hours.  CBG monitoring, ED     Status: None   Collection Time: 08/11/23  9:58 PM  Result Value Ref Range   Glucose-Capillary 92 70 -  99 mg/dL    Comment: Glucose reference range applies only to samples taken after fasting for at least 8 hours.  CBG monitoring, ED     Status: None   Collection Time: 08/12/23  6:08 AM  Result Value Ref Range   Glucose-Capillary 83 70 - 99 mg/dL    Comment: Glucose reference range applies only to samples taken after fasting for at least 8 hours.   Comment 1 Notify RN    Comment 2 Document in Chart   CBG monitoring, ED     Status: Abnormal   Collection Time: 08/12/23 10:14 AM  Result Value Ref Range   Glucose-Capillary 169 (H) 70 - 99 mg/dL    Comment: Glucose reference range applies only to samples taken after fasting for at least 8 hours.  Magnesium      Status: None   Collection Time: 08/12/23 10:17 AM   Result Value Ref Range   Magnesium  2.1 1.7 - 2.4 mg/dL    Comment: Performed at [facility], [address], [place], [place] [address]  Phosphorus     Status: None   Collection Time: 08/12/23 10:17 AM  Result Value Ref Range   Phosphorus 3.7 2.5 - 4.6 mg/dL    Comment: Performed at [facility], [address] [place], [place] [address]  Basic metabolic panel     Status: Abnormal   Collection Time: 08/12/23 10:17 AM  Result Value Ref Range   Sodium 142 135 - 145 mmol/L   Potassium 3.3 (L) 3.5 - 5.1 mmol/L   Chloride 103 98 - 111 mmol/L   CO2 23 22 - 32 mmol/L   Glucose, Bld 80 70 - 99 mg/dL    Comment: Glucose reference range applies only to samples taken after fasting for at least 8 hours.   BUN 18 8 - 23 mg/dL   Creatinine, Ser 8.50 (H) 0.61 - 1.24 mg/dL   Calcium  8.7 (L) 8.9 - 10.3 mg/dL   GFR, Estimated 49 (L) >60 mL/min    Comment: (NOTE) Calculated using the CKD-EPI Creatinine Equation (2021)    Anion gap 16 (H) 5 - 15    Comment: Performed at [facility], [address], [place], [place] [address]  CBG monitoring, ED     Status: None   Collection Time: 08/12/23 12:39 PM  Result Value Ref Range   Glucose-Capillary 95 70 - 99 mg/dL    Comment: Glucose reference range applies only to samples taken after fasting for at least 8 hours.  Glucose, capillary     Status: None   Collection Time: 08/12/23  6:14 PM  Result Value Ref Range   Glucose-Capillary 80 70 - 99 mg/dL    Comment: Glucose reference range applies only to samples taken after fasting for at least 8 hours.  Ethanol     Status: None   Collection Time: 08/12/23  6:37 PM  Result Value Ref Range   Alcohol, Ethyl (B) <10 <10 mg/dL    Comment: (NOTE) Lowest detectable limit for serum alcohol is 10 mg/dL.  For medical purposes only. Performed at [facility], [address] [place], [place] [address]   CBC     Status: None   Collection Time: 08/12/23  6:37 PM  Result Value Ref  Range   WBC 6.5 4.0 - 10.5 K/uL   RBC 4.65 4.22 - 5.81 MIL/uL   Hemoglobin 14.2 13.0 - 17.0 g/dL   HCT 58.4 60.9 - 47.9 %  MCV 89.2 80.0 - 100.0 fL    Comment: REPEATED TO VERIFY DELTA CHECK NOTED    MCH 30.5 26.0 - 34.0 pg   MCHC 34.2 30.0 - 36.0 g/dL   RDW 85.1 88.4 - 84.4 %   Platelets 159 150 - 400 K/uL   nRBC 0.0 0.0 - 0.2 %    Comment: Performed at [facility], [address], [place], [place] [address]  Glucose, capillary     Status: Abnormal   Collection Time: 08/12/23  9:12 PM  Result Value Ref Range   Glucose-Capillary 122 (H) 70 - 99 mg/dL    Comment: Glucose reference range applies only to samples taken after fasting for at least 8 hours.  Basic metabolic panel     Status: Abnormal   Collection Time: 08/13/23  2:25 AM  Result Value Ref Range   Sodium 139 135 - 145 mmol/L   Potassium 3.3 (L) 3.5 - 5.1 mmol/L   Chloride 102 98 - 111 mmol/L   CO2 28 22 - 32 mmol/L   Glucose, Bld 130 (H) 70 - 99 mg/dL    Comment: Glucose reference range applies only to samples taken after fasting for at least 8 hours.   BUN 18 8 - 23 mg/dL   Creatinine, Ser 8.59 (H) 0.61 - 1.24 mg/dL   Calcium  8.3 (L) 8.9 - 10.3 mg/dL   GFR, Estimated 53 (L) >60 mL/min    Comment: (NOTE) Calculated using the CKD-EPI Creatinine Equation (2021)    Anion gap 9 5 - 15    Comment: Performed at [facility], [address], [place], [place] [address]  Glucose, capillary     Status: None   Collection Time: 08/13/23  6:10 AM  Result Value Ref Range   Glucose-Capillary 97 70 - 99 mg/dL    Comment: Glucose reference range applies only to samples taken after fasting for at least 8 hours.    ECG   N/A  Telemetry   Sinus rhythm with PACs and PVCs- Personally Reviewed  Radiology    CARDIAC CATHETERIZATION Result Date: 08/12/2023   Ramus lesion is 30% stenosed.   Prox RCA lesion is 30% stenosed.   Previously placed Dist RCA stent of unknown type is  widely patent.   LV end diastolic  pressure is normal. Mild nonobstructive CAD. Normal LVEDP 14 mm Hg Medical management   ECHOCARDIOGRAM COMPLETE Result Date: 08/12/2023    ECHOCARDIOGRAM REPORT   Patient Name:   Christopher [name] Date of Exam: 08/12/2023 Medical Rec #:  [id number]      Height:       70.0 in Accession #:    [id number]     Weight:       230.0 lb Date of Birth:  01/31/1951      BSA:          2.215 m Patient Age:    72 years       BP:           162/90 mmHg Patient Gender: M              HR:           98 bpm. Exam Location:  Inpatient Procedure: 2D Echo, Cardiac Doppler, Color Doppler and Intracardiac            Opacification Agent Indications:    CHF-Acute Systolic 428.21 / I50.21  History:        Patient has prior history of Echocardiogram examinations, most  recent 05/31/2023. CHF, Signs/Symptoms:Shortness of Breath and                 Chest Pain; Risk Factors:Hypertension and Diabetes.                  Mitral Valve: valve is present in the mitral position.  Sonographer:    Lanell Maduro Referring Phys: MARIO GAILS PATEL IMPRESSIONS  1. Left ventricular ejection fraction, by estimation, is 30 to 35%. The left ventricle has moderately decreased function. The left ventricle demonstrates global hypokinesis. There is mild concentric left ventricular hypertrophy. Left ventricular diastolic function could not be evaluated. Elevated left ventricular end-diastolic pressure.  2. Right ventricular systolic function is normal. The right ventricular size is normal.  3. Status post mitral annuloplasty. Mean gradient 3 mmHG at 98 bpm at, at risk for functional mitral stenosis. The mitral valve is normal in structure. Mild mitral valve regurgitation. No evidence of mitral stenosis. The mean mitral valve gradient is 3.0 mmHg. There is a present in the mitral position.  4. The aortic valve is normal in structure. Aortic valve regurgitation is not visualized. No aortic stenosis is present.  5. The inferior vena cava is normal in size with  greater than 50% respiratory variability, suggesting right atrial pressure of 3 mmHg. FINDINGS  Left Ventricle: Left ventricular ejection fraction, by estimation, is 30 to 35%. The left ventricle has moderately decreased function. The left ventricle demonstrates global hypokinesis. Definity  contrast agent was given IV to delineate the left ventricular endocardial borders. The left ventricular internal cavity size was normal in size. There is mild concentric left ventricular hypertrophy. Abnormal (paradoxical) septal motion consistent with post-operative status. Left ventricular diastolic function could not be evaluated due to mitral valve repair. Left ventricular diastolic function could not be evaluated. Elevated left ventricular end-diastolic pressure. Right Ventricle: The right ventricular size is normal. No increase in right ventricular wall thickness. Right ventricular systolic function is normal. Left Atrium: Left atrial size was normal in size. Right Atrium: Right atrial size was normal in size. Pericardium: There is no evidence of pericardial effusion. Presence of epicardial fat layer. Mitral Valve: Status post mitral annuloplasty. Mean gradient 3 mmHG at 98 bpm at, at risk for functional mitral stenosis. The mitral valve is normal in structure. Mild mitral valve regurgitation. There is a present in the mitral position. No evidence of mitral valve stenosis. MV peak gradient, 6.7 mmHg. The mean mitral valve gradient is 3.0 mmHg. Tricuspid Valve: The tricuspid valve is normal in structure. Tricuspid valve regurgitation is not demonstrated. No evidence of tricuspid stenosis. Aortic Valve: The aortic valve is normal in structure. Aortic valve regurgitation is not visualized. No aortic stenosis is present. Pulmonic Valve: The pulmonic valve was not well visualized. Pulmonic valve regurgitation is mild. No evidence of pulmonic stenosis. Aorta: The aortic root is normal in size and structure. Venous: The inferior  vena cava is normal in size with greater than 50% respiratory variability, suggesting right atrial pressure of 3 mmHg. IAS/Shunts: No atrial level shunt detected by color flow Doppler.  LEFT VENTRICLE PLAX 2D LVIDd:         6.50 cm      Diastology LVIDs:         5.80 cm      LV e' medial:    4.03 cm/s LV PW:         1.20 cm      LV E/e' medial:  40.7 LV IVS:  1.20 cm      LV e' lateral:   4.79 cm/s LVOT diam:     2.70 cm      LV E/e' lateral: 34.2 LV SV:         91 LV SV Index:   41 LVOT Area:     5.73 cm  LV Volumes (MOD) LV vol d, MOD A2C: 198.0 ml LV vol d, MOD A4C: 194.0 ml LV vol s, MOD A2C: 142.0 ml LV vol s, MOD A4C: 125.0 ml LV SV MOD A2C:     56.0 ml LV SV MOD A4C:     194.0 ml LV SV MOD BP:      62.3 ml RIGHT VENTRICLE             IVC RV Basal diam:  4.20 cm     IVC diam: 1.60 cm RV Mid diam:    2.30 cm RV S prime:     15.50 cm/s TAPSE (M-mode): 3.8 cm LEFT ATRIUM             Index        RIGHT ATRIUM           Index LA diam:        3.90 cm 1.76 cm/m   RA Area:     14.60 cm LA Vol (A2C):   42.3 ml 19.10 ml/m  RA Volume:   34.50 ml  15.58 ml/m LA Vol (A4C):   46.7 ml 21.08 ml/m LA Biplane Vol: 47.7 ml 21.53 ml/m  AORTIC VALVE LVOT Vmax:   97.75 cm/s LVOT Vmean:  62.250 cm/s LVOT VTI:    0.159 m  AORTA Ao Root diam: 3.30 cm Ao Asc diam:  3.40 cm MITRAL VALVE MV Area (PHT): 2.32 cm     SHUNTS MV Area VTI:   1.90 cm     Systemic VTI:  0.16 m MV Peak grad:  6.7 mmHg     Systemic Diam: 2.70 cm MV Mean grad:  3.0 mmHg MV Vmax:       1.29 m/s MV Vmean:      78.4 cm/s MV Decel Time: 327 msec MV E velocity: 164.00 cm/s MV A velocity: 93.60 cm/s MV E/A ratio:  1.75 Kardie Tobb DO Electronically signed by Dub Huntsman DO Signature Date/Time: 08/12/2023/11:09:15 AM    Final     Cardiac Studies   See above  Assessment   Principal Problem:   SOB (shortness of breath) Active Problems:   Essential hypertension   Acute on chronic systolic CHF (congestive heart failure) (HCC)   AF (paroxysmal atrial  fibrillation) (HCC)   Alcohol use   Coronary artery disease involving native coronary artery of native heart without angina pectoris   OSA on CPAP   GERD without esophagitis   Diabetes mellitus type 2 with complications (HCC)   AKI (acute kidney injury) (HCC)   Plan   Christopher [name] had minimal coronary disease with a patent distal RCA stent. He has diuresed well. Will switch to oral diuretics today, lasix  40 mg daily. Restart home Entresto  97/103 BID, continue aldactone  12.5 mg daily, restart Farxiga  10 mg daily, and change to Toprol  XL 100 mg daily for GDMT. Will arrange for follow-up. Can be discharged today.  [facility] will sign off.   Medication Recommendations:  as above Other recommendations (labs, testing, etc):  none Follow up as an outpatient:  Laterra Lubinski or APP  Time Spent Directly with Patient:  I have spent a total of 35  minutes with the patient reviewing hospital notes, telemetry, EKGs, labs and examining the patient as well as establishing an assessment and plan that was discussed personally with the patient.  > 50% of time was spent in direct patient care.  Length of Stay:  LOS: 2 days   Vinie KYM Maxcy, MD, [facility], FACP  [place]  [facility]  Medical Director of the [facility] &  Cardiovascular Risk Reduction Clinic Diplomate of the American Board of Clinical Lipidology Attending Cardiologist  Direct Dial: [phone]  Fax: [phone]  Website:  [email or web address]  Vinie BROCKS Mirabella Hilario 08/13/2023, 8:40 AM

## 2023-08-13 NOTE — Assessment & Plan Note (Addendum)
 Continue rate control with metoprolol . Patient is not on apixaban , continue with clopidogrel  for now.

## 2023-08-13 NOTE — Progress Notes (Signed)
   Heart Failure Stewardship Pharmacist Progress Note   PCP: [name], MD PCP-Cardiologist: Vinie JAYSON Maxcy, MD    HPI:  73 yo M with PMH of CHF, CAD, diabetes, and HLD.   Underwent PCI to RCA in 2014 and mitral valve repair for endocarditis. Subsequently had vfib and cardiac arrest in 05/2017. EF 35-45%. Improved to 55-60% with medical therapy. In 02/2021, admitted for acute CVA - EF dropped to 30-35%. Most recent ECHO 05/2023 with EF 45%.  Presented to the ED on 2/5 with progressive shortness of breath over the last few weeks with LE edema, orthopnea, and increased fatigue. BNP 2414. CXR with mild congestive heart failure. ECHO 2/6 with EF 30-35%, global hypokinesis, RV normal, mild MR. Taken for [facility] on 2/6 and found to have mild nonobstructive CAD with patent distal RCA stent. LVEDP 14.   Denies shortness of breath. Still experiencing some shortness of breath at night or when trying to lay flat. Trace LEE. Looking forward to going home today. Agreeable to receiving discharge medications from [facility] today.   Current HF Medications: Diuretic: furosemide  40 mg daily Beta Blocker: metoprolol  tartrate 50 mg BID ACE/ARB/ARNI: Entresto  97/103 mg BID MRA: spironolactone  12.5 mg daily SGLT2i: Farxiga  10 mg daily  Prior to admission HF Medications: Beta blocker: metoprolol  XL 100 mg daily ACE/ARB/ARNI: Entresto  97/103 mg BID MRA: spironolactone  12.5 mg daily SGLT2i: Farxiga  10 mg daily  Pertinent Lab Values: Serum creatinine 1.40, BUN 18, Potassium 3.3, Sodium 139, BNP 2414.3, Magnesium  2.1  Vital Signs: Weight: 224 lbs (admission weight: 230 lbs) Blood pressure: 120-180/80s  Heart rate: 60-70s  I/O: net -2.4L yesterday; net -4.4L since admission  Medication Assistance / Insurance Benefits Check: Does the patient have prescription insurance?  Yes Type of insurance plan: [facility] Medicare  Outpatient Pharmacy:  Prior to admission outpatient [facility]; Mail order Is the  patient willing to use [facility] T[facility] pharmacy at discharge? Yes Is the patient willing to transition their outpatient pharmacy to utilize a [facility] outpatient pharmacy?   No    Assessment: 1. Acute on chronic systolic CHF (LVEF 35-40%), due to NICM. NYHA class II symptoms. - Agree with transitioning to furosemide  40 mg PO daily. Strict I/Os and daily weights. Keep K>4 and Mg>2. KCl 40 mEq x 1 given for replacement.  - On metoprolol  tartrate 50 mg BID. With HFrEF, consider consolidating back to PTA metoprolol  succinate 100 mg daily at discharge. - Agree with restarting Entresto  97/103 mg BID, spironolactone  12.5 mg daily, and Farxiga  10 mg daily   Plan: 1) Medication changes recommended at this time: - Change metoprolol  tartrate to metoprolol  succinate 100 mg daily prior to discharge  2) Patient assistance: - Patient states his copays are affordable  3)  Education  - Patient has been educated on current HF medications and potential additions to HF medication regimen - Patient verbalizes understanding that over the next few months, these medication doses may change and more medications may be added to optimize HF regimen - Patient has been educated on basic disease state pathophysiology and goals of therapy   Duwaine Plant, PharmD, BCPS Heart Failure Stewardship Pharmacist Phone [phone]

## 2023-08-13 NOTE — Assessment & Plan Note (Signed)
 CKD stage 3a, hypokalemia.   Volume status has improved, at the time of his discharge his serum cr is 1,40 with K at 3,3 and serum bicarbonate at 28  Na 139   He will receive 40 meq Kcl prior to his discharge.  Continue diuresis with furosemide , spironolactone  and SGLT 2 inh.  Follow up renal function and electrolytes as outpatient in 7 days.

## 2023-08-13 NOTE — TOC Transition Note (Signed)
 Transition of Care [facility]) - Discharge Note   Patient Details  Name: Christopher [name] MRN: [id number] Date of Birth: [birth year]  Transition of [facility]) CM/SW Contact:  Waddell Barnie Rama, RN Phone Number: 08/13/2023, 1:09 PM   Clinical Narrative:    For dc today, he is set up with OP PT.  He has transportation.         Patient Goals and CMS Choice            Discharge Placement                       Discharge Plan and Services Additional resources added to the After Visit Summary for                                       Social Drivers of Health (SDOH) Interventions SDOH Screenings   Food Insecurity: No Food Insecurity (08/12/2023)  Housing: Low Risk  (08/12/2023)  Transportation Needs: No Transportation Needs (08/12/2023)  Utilities: Not At Risk (08/12/2023)  Alcohol Screen: Low Risk  (08/13/2023)  Depression (PHQ2-9): Low Risk  (11/20/2022)  Financial Resource Strain: Low Risk  (08/13/2023)  Physical Activity: Unknown (05/27/2023)  Social Connections: Socially Isolated (08/12/2023)  Stress: No Stress Concern Present (05/27/2023)  Tobacco Use: Medium Risk (08/10/2023)     Readmission Risk Interventions     No data to display

## 2023-08-13 NOTE — Assessment & Plan Note (Signed)
Continue with proton pump inhibitor 

## 2023-08-13 NOTE — Hospital Course (Signed)
 [name] was admitted to the hospital with the working diagnosis of acute on chronic heart failure exacerbation.   73 yo male with the past medical history of heart failure, coronary artery disease, history of endocarditis, now sp MV repair, history of CVA, and hypertension who presented with progressive dyspnea. On her initial physical examination his blood pressure was 154/106, 145/97, HR 77, RR 19 and 02 saturation 97%, lungs with bilateral rales with no wheezing, heart with S1 and S2 present and regular with no gallops, or rubs, abdomen with no distention and positive lower extremity edema. \  Na 136, K 3,9 Cl 106, glucose 114 bun 19 cr 1,55  BNP 2,414 High sensitive troponin 12 and 15  Wbc 7,5 hgb 13,6 plt 171   Chest radiograph with hypoinflation, positive cardiomegaly, with bilateral hilar vascular congestion, bilateral interstitial infiltrates, pacemaker defibrillator in place with one right ventricular lead in place.   EKG 69 bpm, right axis deviation, right bundle branch block, qtc 505, sinus rhythm with 1st degree AV block, no significant ST segment or T wave changes, positive PVC.   Patient was placed on IV furosemide  for diuresis with improvement in his symptoms.  02/06 cardiac catheterization with mild non obstructive coronary artery disease.  Normal left ventricle end diastolic pressure at 14 mmHg.   02/07 patient feeling back to his baseline, plan for discharge home to day and follow up as outpatient.  Continue diuresis with furosemide  and continue guideline directed medical therapy for heart failure.

## 2023-08-13 NOTE — Assessment & Plan Note (Signed)
 CPAP at home.

## 2023-08-13 NOTE — Assessment & Plan Note (Signed)
 Echocardiogram with reduced LV systolic function with EF 30 to 35%, global hypokinesis, mild concentric LVH, RV systolic function preserved, sp mitral annuloplasty, mild MR.   Patient was placed on furosemide  for diuresis, negative fluid balance was achieved, -4,225 ml, with significant improvement in his symptoms.   Patient will continue diuresis with furosemide  40 mg po daily.  Continue with guideline directed medical therapy with spironolactone , SGLT 2 inh, metoprolol  and Entresto .  Follow up as outpatient with Cardiology and Primary Care.

## 2023-08-13 NOTE — Assessment & Plan Note (Signed)
 Patient was placed on insulin  sliding scale for glucose cover and monitoring. His glucose has remained stable.  At home will resume his usual diabetic regimen.

## 2023-08-13 NOTE — Assessment & Plan Note (Signed)
 No acute coronary syndrome, plan to continue clopidogrel  and statin therapy.  Continue blood pressure control.

## 2023-08-13 NOTE — Progress Notes (Signed)
 Heart Failure Nurse Navigator Progress Note  PCP: [name], MD PCP-Cardiologist: Blanca Admission Diagnosis: Acute congestive heart failure.  Admitted from: Home via EMS  Presentation:   Christopher [name] presented with shortness of breath x 1 week, BLE edema, reports to eating a lot of salty foods and junk food. BP 147/76, HR 63, BNP 2,414, CXR mild congestive heart failure, small pleural effusion, and mild pulmonary edema.   Patient was educated on the sign and symptoms of heart failure, daily weights, when to call his doctor or go to the ED, Diet/ fluid restrictions ( reports to drinking around 8-10 Diet Pepsi's per day, and eating salty foods like chips). Continued education on taking all medications as prescribed and attending all medical appointments. Patient verbalized his understanding of all education. A HF TOC appointment was scheduled for 08/20/2023 @ 3 pm.     ECHO/ LVEF: 30-35%  Clinical Course:  Past Medical History:  Diagnosis Date   Arthritis    Atrial fibrillation (HCC)    post op, intol of anticoag   Cardiac arrest (HCC)    a. s/p MDT single chamber ICD; 11-10-18- pt denies having a heart attack   Cataract    CHF (congestive heart failure) (HCC)    Chronic kidney disease    kidney stone   Coronary artery disease    a. 2/7 Cath: LM nl, LAD min irregs, LCX min irregs, RI 40, RCA 42m, EF 55-60% basal to mid inf HK, 3-4+ MR;  b. 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 Mitral valve prolapse with flail segment of posterior leaflet and severe MR by TEE, remote h/o bacterial endocarditis    Sleep apnea    NPSG 01/21/06- AHI 40.7/hr cpap   Stroke [facility])    summer 2022- one messed up vision, the other balance   Subacute bacterial endocarditis 03/22/2008   Strep viridans   Ventricular fibrillation (HCC) 11/07/2019   appropriate shock (36J) for VF delivered     Social History   Socioeconomic History   Marital status: Divorced    Spouse name: Not on file   Number of children: 0   Years of education: Not on file   Highest education level: 12th grade  Occupational History   Occupation: Works at [employer] Retired  Tobacco Use   Smoking status: Former    Current packs/day: 0.00    Average packs/day: 2.5 packs/day for 5.0 years (12.5 ttl pk-yrs)    Types: Cigarettes    Start date: 07/06/1973    Quit date: 07/06/1978    Years since quitting: 45.1   Smokeless tobacco: Never   Tobacco comments:    social drinker  Vaping Use   Vaping status: Never Used  Substance and Sexual Activity   Alcohol use: Yes    Comment: OCC.   Drug use: No   Sexual activity: Yes  Other Topics Concern   Not on file  Social History Narrative  Works at [name] - plans to retire 07/2015 after 36 years.   Divorced, lives alone   Supportive g-friend (shirlee moore)         Social Drivers of Health   Financial Resource Strain: Low Risk  (05/27/2023)   Overall Financial Resource Strain (CARDIA)    Difficulty of Paying Living Expenses: Not hard at all  Food Insecurity: No Food Insecurity (08/12/2023)   Hunger Vital Sign    Worried About Running Out of Food in the Last Year: Never true    Ran Out of Food in the Last Year: Never true  Transportation Needs: No Transportation Needs (08/12/2023)   PRAPARE - Administrator, Civil Service (Medical): No    Lack of Transportation (Non-Medical): No  Physical Activity: Unknown (05/27/2023)   Exercise Vital  Sign    Days of Exercise per Week: 0 days    Minutes of Exercise per Session: Not on file  Stress: No Stress Concern Present (05/27/2023)   [employer] of Occupational Health - Occupational Stress Questionnaire    Feeling of Stress : Not at all  Social Connections: Socially Isolated (08/12/2023)   Social Connection and Isolation Panel [NHANES]    Frequency of Communication with Friends and Family: Once a week    Frequency of Social Gatherings with Friends and Family: Once a week    Attends Religious Services: Never    Database Administrator or Organizations: No    Attends Engineer, Structural: Never    Marital Status: Divorced   Water Engineer and Provision:  Detailed education and instructions provided on heart failure disease management including the following:  Signs and symptoms of Heart Failure When to call the physician Importance of daily weights Low sodium diet Fluid restriction Medication management Anticipated future follow-up appointments  Patient education given on each of the above topics.  Patient acknowledges understanding via teach back method and acceptance of all instructions.  Education Materials:  Living Better With Heart Failure Booklet, HF zone tool, & Daily Weight Tracker Tool.  Patient has scale at home: Yes Patient has pill box at home: Yes    High Risk Criteria for Readmission and/or Poor Patient Outcomes: Heart failure hospital admissions (last 6 months): 1  No Show rate: 1% Difficult social situation: No Demonstrates medication adherence: Yes Primary Language: English Literacy level: reading, writing,and comprehension  Barriers of Care:   Diet/ fluid restrictions compliance ( drinks 8-10 diet pepsi per day, salty foods) Daily weights  Considerations/Referrals:   Referral made to Heart Failure Pharmacist Stewardship: Yes Referral made to Heart Failure CSW/NCM TOC: No Referral made to Heart & Vascular TOC clinic: Yes,  08/20/2023 @ 3 pm  Items for Follow-up on DC/TOC: Diet/ fluid compliance ( 8-10 Diet Pepsi per day, salty foods) Daily weights Continued HF education   Stephane Haddock, BSN, RN Heart Failure Print Production Planner Chat Only

## 2023-08-13 NOTE — Progress Notes (Signed)
 Discharge instructions (including medications) discussed with and copy provided to patient. Patient verbalized understanding. PIV removed and patient dressed himself. CCMD made aware of dc as well and monitor removed. TOC medications picked up at discharge

## 2023-08-13 NOTE — Assessment & Plan Note (Signed)
 Continue blood pressure control with Entresto  and metoprolol .  Continue diuretic regimen.

## 2023-08-13 NOTE — TOC Progression Note (Signed)
 Transition of Care [facility]) - Progression Note    Patient Details  Name: Christopher [name] MRN: [id number] Date of Birth: 1951-04-30  Transition of Care [facility]) CM/SW Contact  Waddell Barnie Rama, RN Phone Number: 08/13/2023, 11:45 AM  Clinical Narrative:     Per pt eval rec OP PT,  NCM spoke with patient and he would like this NCM to set this up for him, he states he is ok to go to the OP PT  rehabilitation on [facility].  NCM sent referral thur epic .       Expected Discharge Plan and Services                                               Social Determinants of Health (SDOH) Interventions SDOH Screenings   Food Insecurity: No Food Insecurity (08/12/2023)  Housing: Low Risk  (08/12/2023)  Transportation Needs: No Transportation Needs (08/12/2023)  Utilities: Not At Risk (08/12/2023)  Alcohol Screen: Low Risk  (08/13/2023)  Depression (PHQ2-9): Low Risk  (11/20/2022)  Financial Resource Strain: Low Risk  (08/13/2023)  Physical Activity: Unknown (05/27/2023)  Social Connections: Socially Isolated (08/12/2023)  Stress: No Stress Concern Present (05/27/2023)  Tobacco Use: Medium Risk (08/10/2023)    Readmission Risk Interventions     No data to display

## 2023-08-16 ENCOUNTER — Telehealth: Payer: Self-pay

## 2023-08-16 ENCOUNTER — Ambulatory Visit: Payer: Medicare Other

## 2023-08-16 DIAGNOSIS — I428 Other cardiomyopathies: Secondary | ICD-10-CM

## 2023-08-16 DIAGNOSIS — I5022 Chronic systolic (congestive) heart failure: Secondary | ICD-10-CM

## 2023-08-16 LAB — LIPOPROTEIN A (LPA): Lipoprotein (a): 31.2 nmol/L — ABNORMAL HIGH (ref <75.0)

## 2023-08-16 NOTE — Transitions of Care (Post Inpatient/ED Visit) (Signed)
 08/16/2023  Name: Christopher [name] MRN: [id number] DOB: 06-29-1951  Today's TOC FU Call Status:   Patient's Name and Date of Birth confirmed.  Transition Care Management Follow-up Telephone Call Date of Discharge: 08/13/23 Discharge Facility: [facility]) Type of Discharge: Inpatient Admission Primary Inpatient Discharge Diagnosis:: Acute on Chronic CHF How have you been since you were released from the hospital?: Better Any questions or concerns?: Yes Patient Questions/Concerns:: Patient had a question about how often to take his Entresto Arnetta Lank he wasn't sure because of how it was marked on his discharge instructions calendar. Patient Questions/Concerns Addressed: Other: (Reviewed the Current Medication List contained in his discharge instruction packet and clarified that his Entresto  is still taking twice a day - no changes were made to this this hospitalization. Patient verb. understanding.)  Items Reviewed: Did you receive and understand the discharge instructions provided?: Yes Medications obtained,verified, and reconciled?: Yes (Medications Reviewed) Any new allergies since your discharge?: No Dietary orders reviewed?: Yes Type of Diet Ordered:: Low sodium Heart Healthy Do you have support at home?: Yes People in Home: significant other  Medications Reviewed Today: Medications Reviewed Today     Reviewed by Endora Teresi, RN (Case Manager) on 08/16/23 at 1601  Med List Status: <None>   Medication Order Taking? Sig Documenting Provider Last Dose Status Informant  ACCU-CHEK GUIDE test strip [id number] Yes USE AS INSTRUCTED Adelia Homestead, MD Taking Active Pharmacy Records, Self  Accu-Chek Softclix Lancets lancets [id number] Yes Use as instructed Adelia Homestead, MD Taking Active Pharmacy Records, Self  acetaminophen  (TYLENOL ) 500 MG tablet [id number] Yes Take 500 mg by mouth every 6 (six) hours as needed for mild pain (pain score 1-3) or headache. [provider] Taking Active Self, Pharmacy Records  atorvastatin  (LIPITOR) 40 MG tablet [id number] Yes Take 1 tablet (40 mg total) by mouth daily. Adelia Homestead, MD Taking Active Pharmacy Records, Self  blood glucose meter kit and supplies KIT [id number] Yes Dispense based on patient and insurance preference. Use up to four times daily as directed. Regalado, Belkys A, MD Taking Active Pharmacy Records, Self           Med Note Haskell Linker, Milinda Allen Mar 13, 2021  8:36 AM)    cetirizine (ZYRTEC) 10 MG tablet [id number] Yes Take 10 mg by mouth daily. [provider] Taking Active Self, Pharmacy Records  cholecalciferol  (VITAMIN D ) 25 MCG (1000 UNIT) tablet [id number] Yes Take 1 tablet by mouth daily.  [provider] Taking Active Self, Pharmacy Records  clopidogrel  (PLAVIX ) 75 MG tablet [id number] Yes TAKE 1 TABLET BY MOUTH DAILY Adelia Homestead, MD Taking Active Pharmacy Records, Self  FARXIGA  10 MG TABS tablet [id number] Yes TAKE 1 TABLET BY MOUTH EVERY DAY BEFORE BREAKFAST Camnitz, Babetta Lesch, MD Taking Active Pharmacy Records, Self  fenofibrate  (TRICOR ) 145 MG tablet [id number] Yes Take 1 tablet (145 mg total) by mouth daily. Adelia Homestead, MD Taking Active Pharmacy Records, Self  fluticasone  (FLONASE ) 50 MCG/ACT nasal spray [id number] Yes USE 2 SPRAYS IN BOTH NOSTRILS  DAILY Adelia Homestead, MD Taking Active Pharmacy Records, Self  furosemide  (LASIX ) 40 MG tablet [id number] Yes Take 1 tablet (40 mg total) by mouth daily. Arrien, Curlee Doss, MD Taking Active   Insulin  Lispro Prot & Lispro (HUMALOG  MIX 75/25 KWIKPEN) (75-25) 100 UNIT/ML Baldwin Levee [id number] Yes Inject 18 Units into the skin in the morning and at bedtime. Adelia Homestead, MD Taking Active Pharmacy Records, Self  Insulin   Pen Needle (PEN NEEDLES 31GX5/16") 31G X 8 MM MISC [id number] Yes Use twice a day for insulin  injection, E11.9 Adelia Homestead, MD Taking Active Pharmacy Records,  Self  metFORMIN  (GLUCOPHAGE ) 1000 MG tablet [id number] Yes TAKE 1 TABLET BY MOUTH TWICE  DAILY WITH A MEAL Adelia Homestead, MD Taking Active Pharmacy Records, Self  metoprolol  succinate (TOPROL -XL) 100 MG 24 hr tablet [id number] Yes Take 1 tablet (100 mg total) by mouth daily. Adelia Homestead, MD Taking Active Pharmacy Records, Self  OVER THE COUNTER MEDICATION [id number] Yes 1 tablet in the morning and at bedtime. [facility] - Restore multivitamin [provider] Taking Active Pharmacy Records, Self  sacubitril -valsartan  (ENTRESTO ) 97-103 MG [id number] Yes TAKE 1 TABLET BY MOUTH TWICE A DAY Hilty, Aviva Lemmings, MD Taking Active Pharmacy Records, Self  spironolactone  (ALDACTONE ) 25 MG tablet [id number] Yes TAKE ONE-HALF TABLET BY MOUTH  DAILY Adelia Homestead, MD Taking Active Pharmacy Records, Self  tamsulosin  (FLOMAX ) 0.4 MG CAPS capsule [id number] Yes Take 1 capsule (0.4 mg total) by mouth daily. Adelia Homestead, MD Taking Active Pharmacy Records, Self            Home Care and Equipment/Supplies: Were Home Health Services Ordered?: NA Any new equipment or medical supplies ordered?: NA  Functional Questionnaire: Do you need assistance with bathing/showering or dressing?: No Do you need assistance with meal preparation?: No Do you need assistance with eating?: No Do you have difficulty maintaining continence: No Do you need assistance with getting out of bed/getting out of a chair/moving?: No Do you have difficulty managing or taking your medications?: No  Follow up appointments reviewed: PCP Follow-up appointment confirmed?: Yes Date of PCP follow-up appointment?: 08/18/23 Follow-up Provider: Bambi Lever, MD Specialist Hospital Follow-up appointment confirmed?: Yes Date of Specialist follow-up appointment?: 08/20/23 Follow-Up Specialty Provider:: 2/14 - Heart & Vascular Transition of Care New, 2/20 Dr. Ervin Heath [facility] Do you need transportation  to your follow-up appointment?: No Do you understand care options if your condition(s) worsen?: Yes-patient verbalized understanding  SDOH Interventions Today    Flowsheet Row Most Recent Value  SDOH Interventions   Food Insecurity Interventions Intervention Not Indicated  Housing Interventions Intervention Not Indicated  Transportation Interventions Intervention Not Indicated  Utilities Interventions Intervention Not Indicated      TOC Interventions Today    Flowsheet Row Most Recent Value  TOC Interventions   TOC Interventions Discussed/Reviewed TOC Interventions Discussed, TOC Interventions Reviewed, Post discharge activity limitations per provider.  Patient states he has declined outpatient rehabilitation services and notified them of this when they called today.  Patient states he is walking just fine - no changes from his pre-hospital baseline.  Patient states he is aware that his pacemaker/defibrillator is interrogated.  Next dates are 08/26/23, 09/05/33, 11/15/23 Interventions Today    Flowsheet Row Most Recent Value  Chronic Disease   Chronic disease during today's visit Diabetes, Congestive Heart Failure (CHF), Atrial Fibrillation (AFib)  General Interventions   General Interventions Discussed/Reviewed General Interventions Discussed, General Interventions Reviewed, Doctor Visits  Doctor Visits Discussed/Reviewed Doctor Visits Discussed, Doctor Visits Reviewed, PCP, Specialist  PCP/Specialist Visits Compliance with follow-up visit  Exercise Interventions   Exercise Discussed/Reviewed Physical Activity  Physical Activity Discussed/Reviewed Physical Activity Discussed, Physical Activity Reviewed  [Increase activity slowly.  Stop any activity that causes chest pain, sob, dizziness or extreme weakness]  Education Interventions   Education Provided Provided Education  [" Discussed/reviewed benefits offered by some health insurance plans which patients may find  beneficial such as  medical equipment, home health, transportation services. Etc - it varies from plan to plan.]  Provided Verbal Education On Nutrition, Blood Sugar Monitoring, Medication, When to see the doctor, Insurance Plans  [diet-low sodium heart healthy]  Pharmacy Interventions   Pharmacy Dicussed/Reviewed Medications and their functions, Pharmacy Topics Discussed, Pharmacy Topics Reviewed, Medication Adherence  Safety Interventions   Safety Discussed/Reviewed Safety Reviewed, Safety Discussed  [Patient states he has declined his home health therapy services- says he is getting around just fine and did not need the therapy.]      Patient reports he has been recovering well at home without any complications or concerns.  His morning blood sugar was 112.  Reports he logs his blood sugar and his weights daily and takes the logs with him to his doctor appointment.  Patient has transportation to his medical appointments.  Today he denies any acute medical needs, and denies any questions or concerns.   Randye Buttner BSN, Programmer, systems   Transitions of Care  [place] / [facility], [facility] Direct Dial Number: [phone]  Fax: [phone]

## 2023-08-17 LAB — CUP PACEART REMOTE DEVICE CHECK
Battery Remaining Longevity: 58 mo
Battery Voltage: 2.91 V
Brady Statistic RV Percent Paced: 0.54 %
Date Time Interrogation Session: 20250209012403
HighPow Impedance: 71 Ohm
Implantable Lead Connection Status: 753985
Implantable Lead Implant Date: 20181102
Implantable Lead Location: 753860
Implantable Pulse Generator Implant Date: 20181102
Lead Channel Impedance Value: 399 Ohm
Lead Channel Impedance Value: 513 Ohm
Lead Channel Pacing Threshold Amplitude: 0.625 V
Lead Channel Pacing Threshold Pulse Width: 0.4 ms
Lead Channel Sensing Intrinsic Amplitude: 4.875 mV
Lead Channel Sensing Intrinsic Amplitude: 4.875 mV
Lead Channel Setting Pacing Amplitude: 2 V
Lead Channel Setting Pacing Pulse Width: 0.4 ms
Lead Channel Setting Sensing Sensitivity: 0.3 mV
Zone Setting Status: 755011
Zone Setting Status: 755011

## 2023-08-18 ENCOUNTER — Ambulatory Visit (INDEPENDENT_AMBULATORY_CARE_PROVIDER_SITE_OTHER): Payer: Medicare Other | Admitting: Internal Medicine

## 2023-08-18 ENCOUNTER — Encounter: Payer: Self-pay | Admitting: Internal Medicine

## 2023-08-18 VITALS — BP 132/84 | HR 87 | Temp 97.7°F | Ht 70.0 in | Wt 234.0 lb

## 2023-08-18 DIAGNOSIS — Z7984 Long term (current) use of oral hypoglycemic drugs: Secondary | ICD-10-CM | POA: Diagnosis not present

## 2023-08-18 DIAGNOSIS — I5023 Acute on chronic systolic (congestive) heart failure: Secondary | ICD-10-CM | POA: Diagnosis not present

## 2023-08-18 DIAGNOSIS — E118 Type 2 diabetes mellitus with unspecified complications: Secondary | ICD-10-CM

## 2023-08-18 LAB — COMPREHENSIVE METABOLIC PANEL
ALT: 36 U/L (ref 0–53)
AST: 34 U/L (ref 0–37)
Albumin: 4.1 g/dL (ref 3.5–5.2)
Alkaline Phosphatase: 40 U/L (ref 39–117)
BUN: 27 mg/dL — ABNORMAL HIGH (ref 6–23)
CO2: 29 meq/L (ref 19–32)
Calcium: 9.4 mg/dL (ref 8.4–10.5)
Chloride: 103 meq/L (ref 96–112)
Creatinine, Ser: 1.35 mg/dL (ref 0.40–1.50)
GFR: 51.91 mL/min — ABNORMAL LOW (ref >60.00)
Glucose, Bld: 83 mg/dL (ref 70–99)
Potassium: 3.8 meq/L (ref 3.5–5.1)
Sodium: 138 meq/L (ref 135–145)
Total Bilirubin: 0.7 mg/dL (ref 0.2–1.2)
Total Protein: 6.9 g/dL (ref 6.0–8.3)

## 2023-08-18 NOTE — Assessment & Plan Note (Signed)
Checking CMP for stability. Taking entresto and farxiga and spironolactone and lasix. Will follow up with cardiology. Counseled extensively on diet for heart and diabetes and ways to find sodium in diet.

## 2023-08-18 NOTE — Progress Notes (Signed)
   Subjective:   Patient ID: Christopher [name], male    DOB: 06/03/1951, 73 y.o.   MRN: [id number]  HPI The patient is a 73 YO man coming in for hospital follow up (in for heart failure and had heart cath, meds adjusted). He is doing well since being home. He has stopped eating potato chips. Weight is up and down slightly but overall steady. He is trying to weigh daily. Curious about how sodium and diabetes play together versus his heart.   Med rec from discharge done during visit and updated as appropriate.   Review of Systems  Constitutional: Negative.   HENT: Negative.    Eyes: Negative.   Respiratory:  Negative for cough, chest tightness and shortness of breath.   Cardiovascular:  Negative for chest pain, palpitations and leg swelling.  Gastrointestinal:  Negative for abdominal distention, abdominal pain, constipation, diarrhea, nausea and vomiting.  Musculoskeletal: Negative.   Skin: Negative.   Neurological: Negative.   Psychiatric/Behavioral: Negative.      Objective:  Physical Exam Constitutional:      Appearance: He is well-developed.  HENT:     Head: Normocephalic and atraumatic.  Cardiovascular:     Rate and Rhythm: Normal rate and regular rhythm.  Pulmonary:     Effort: Pulmonary effort is normal. No respiratory distress.     Breath sounds: Normal breath sounds. No wheezing or rales.  Abdominal:     General: Bowel sounds are normal. There is no distension.     Palpations: Abdomen is soft.     Tenderness: There is no abdominal tenderness. There is no rebound.  Musculoskeletal:     Cervical back: Normal range of motion.  Skin:    General: Skin is warm and dry.  Neurological:     Mental Status: He is alert and oriented to person, place, and time.     Coordination: Coordination normal.     Vitals:   08/18/23 0928  BP: 132/84  Pulse: 87  Temp: 97.7 F (36.5 C)  TempSrc: Oral  SpO2: 99%  Weight: 234 lb (106.1 kg)  Height: 5\' 10"  (1.778 m)    Assessment &  Plan:  Visit time 25 minutes in face to face communication with patient and coordination of care, additional 7 minutes spent in record review, coordination or care, ordering tests, communicating/referring to other healthcare professionals, documenting in medical records all on the same day of the visit for total time 32 minutes spent on the visit.

## 2023-08-18 NOTE — Assessment & Plan Note (Signed)
Counseled extensively about diet for diabetes versus for his heart and the difference in sodium and carbohydrates and sugars. He is working to balance his diet for both heart failure and diabetes.

## 2023-08-18 NOTE — Patient Instructions (Signed)
We will check the labs today.

## 2023-08-19 ENCOUNTER — Encounter: Payer: Self-pay | Admitting: Internal Medicine

## 2023-08-20 ENCOUNTER — Ambulatory Visit (HOSPITAL_COMMUNITY)
Admit: 2023-08-20 | Discharge: 2023-08-20 | Disposition: A | Discharge status: Home / Self Care | Payer: Medicare Other | Source: Ambulatory Visit | Attending: Cardiology | Admitting: Cardiology

## 2023-08-20 VITALS — BP 142/78 | HR 67 | Ht 70.0 in | Wt 228.4 lb

## 2023-08-20 DIAGNOSIS — I447 Left bundle-branch block, unspecified: Secondary | ICD-10-CM

## 2023-08-20 DIAGNOSIS — Z95810 Presence of automatic (implantable) cardiac defibrillator: Secondary | ICD-10-CM | POA: Insufficient documentation

## 2023-08-20 DIAGNOSIS — Z87442 Personal history of urinary calculi: Secondary | ICD-10-CM | POA: Insufficient documentation

## 2023-08-20 DIAGNOSIS — Z8674 Personal history of sudden cardiac arrest: Secondary | ICD-10-CM | POA: Insufficient documentation

## 2023-08-20 DIAGNOSIS — I2510 Atherosclerotic heart disease of native coronary artery without angina pectoris: Secondary | ICD-10-CM | POA: Diagnosis not present

## 2023-08-20 DIAGNOSIS — Z7902 Long term (current) use of antithrombotics/antiplatelets: Secondary | ICD-10-CM | POA: Insufficient documentation

## 2023-08-20 DIAGNOSIS — I110 Hypertensive heart disease with heart failure: Secondary | ICD-10-CM | POA: Diagnosis not present

## 2023-08-20 DIAGNOSIS — Z955 Presence of coronary angioplasty implant and graft: Secondary | ICD-10-CM | POA: Insufficient documentation

## 2023-08-20 DIAGNOSIS — E785 Hyperlipidemia, unspecified: Secondary | ICD-10-CM | POA: Diagnosis not present

## 2023-08-20 DIAGNOSIS — I428 Other cardiomyopathies: Secondary | ICD-10-CM | POA: Insufficient documentation

## 2023-08-20 DIAGNOSIS — Z952 Presence of prosthetic heart valve: Secondary | ICD-10-CM | POA: Diagnosis not present

## 2023-08-20 DIAGNOSIS — I5022 Chronic systolic (congestive) heart failure: Secondary | ICD-10-CM

## 2023-08-20 DIAGNOSIS — Z8673 Personal history of transient ischemic attack (TIA), and cerebral infarction without residual deficits: Secondary | ICD-10-CM | POA: Diagnosis not present

## 2023-08-20 MED ORDER — SPIRONOLACTONE 25 MG PO TABS: 25.0000 mg | ORAL_TABLET | Freq: Every day | ORAL | 2 refills | Status: DC

## 2023-08-20 NOTE — Patient Instructions (Signed)
Medication Changes:  INCREASE SPIRONOLACTONE TO 25MG  ONCE DAILY   Special Instructions // Education:  PLEASE SCHEDULE APPOINTMENT WITH DR. Elberta Fortis-- AT [address] OFFICE   Follow-Up in: WITH GENERAL CARDIOLOGY AS SCHEDULED   At the [facility], you and your health needs are our priority. We have a designated team specialized in the treatment of Heart Failure. This Care Team includes your primary Heart Failure Specialized Cardiologist (physician), Advanced Practice Providers (APPs- Physician Assistants and Nurse Practitioners), and Pharmacist who all work together to provide you with the care you need, when you need it.   You may see any of the following providers on your designated Care Team at your next follow up:  Dr. Arvilla Meres Dr. [name] Dr. Dorthula Nettles Dr. Theresia Bough Tonye Becket, NP Robbie Lis, [place] [facility] [place], [place] Brynda Peon, NP [place] Lee, NP Karle Plumber, PharmD   Please be sure to bring in all your medications bottles to every appointment.   Need to Contact us:  If you have any questions or concerns before your next appointment please send [place] a message through [place] or call our office at [phone].    TO LEAVE A MESSAGE FOR THE NURSE SELECT OPTION 2, PLEASE LEAVE A MESSAGE INCLUDING: YOUR NAME DATE OF BIRTH CALL BACK NUMBER REASON FOR CALL**this is important as we prioritize the call backs  YOU WILL RECEIVE A CALL BACK THE SAME DAY AS LONG AS YOU CALL BEFORE 4:00 PM

## 2023-08-20 NOTE — Progress Notes (Signed)
HEART & VASCULAR TRANSITION OF CARE CONSULT NOTE     Referring Physician: Dr. Warnell Forester, Austin Miles, MD Primary Cardiologist: Dr. Rennis Golden Primary EP: Dr. Elberta Fortis    Chief Complaint: Heart Failure  HPI: Referred to clinic by Dr. Ella Jubilee for heart failure consultation.   Christopher [name] is a 73 y.o. male w/ a h/o chronic systolic heart failure, CAD s/p PCI to RCA in 2014, MR s/p mitral valve annuloplasty in 2024, afib, h/o VF arrest s/p ICD placement in 2018, CVA, HTN and HLD.   Echo 11/24 showed EF 45%, RV normal.   Recent admit 2/25 for progressive dyspnea. Found to be in CHF. Echo showed drop in EF down to 30-35% w/ Abnormal (paradoxical) septal motion and normal RV. Mitral valve annuloplasty ring stable, mean gradient 3 mmHg. EKG showed SR w/ PVCs and LBBB, QRS 154 ms.   He was admitted and diuresed w/ IV  Lasix and underwent LHC which showed widely patent RCA stent and nonobstructive CAD w/ only 30% pRCA and 30% RI lesion.  LVEDP 14 mmHg. He was transitioned to PO diuretics and GDMT. Referred to [facility] clinic.   He presents today for f/u. Doing well. Breathing improved, feels back to baseline. NYHA Class II symptoms. Denies resting dyspnea. No LEE. Wt stable at home. Reports full med compliance. BP mildly elevated in clinic today 142/78. Device interrogation shows slight drop in thoracic impedence and slight increased in fluid index, though below threshold.   Cardiac Testing   2D Echo 2/25 1. Left ventricular ejection fraction, by estimation, is 30 to 35%. The  left ventricle has moderately decreased function. The left ventricle  demonstrates global hypokinesis. There is mild concentric left ventricular  hypertrophy. Left ventricular  diastolic function could not be evaluated. Elevated left ventricular  end-diastolic pressure.   2. Right ventricular systolic function is normal. The right ventricular  size is normal.   3. Status post mitral annuloplasty. Mean gradient  3 mmHG at 98 bpm at, at  risk for functional mitral stenosis. The mitral valve is normal in  structure. Mild mitral valve regurgitation. No evidence of mitral  stenosis. The mean mitral valve gradient is  3.0 mmHg. There is a present in the mitral position.   4. The aortic valve is normal in structure. Aortic valve regurgitation is  not visualized. No aortic stenosis is present.   5. The inferior vena cava is normal in size with greater than 50%  respiratory variability, suggesting right atrial pressure of 3 mmHg.   LHC 2/25   Ramus lesion is 30% stenosed.   Prox RCA lesion is 30% stenosed.   Previously placed Dist RCA stent of unknown type is  widely patent.   LV end diastolic pressure is normal.   Mild nonobstructive CAD. Normal LVEDP 14 mm Hg   Past Medical History:  Diagnosis Date   Arthritis    Atrial fibrillation (HCC)    post op, intol of anticoag   Cardiac arrest (HCC)    a. s/p MDT single chamber ICD; 11-10-18- pt denies having a heart attack   Cataract    CHF (congestive heart failure) (HCC)    Chronic kidney disease    kidney stone   Coronary artery disease    a. 2/7 Cath: LM nl, LAD min irregs, LCX min irregs, RI 40, RCA 20m, EF 55-60% basal to mid inf HK, 3-4+ MR;  b. 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 Mitral valve prolapse with flail segment of posterior leaflet and severe MR by TEE, remote h/o bacterial endocarditis    Sleep apnea    NPSG 01/21/06- AHI 40.7/hr  cpap   Stroke [facility])    summer 2022- one messed up vision, the other balance   Subacute bacterial endocarditis 03/22/2008   Strep viridans   Ventricular fibrillation (HCC) 11/07/2019   appropriate shock (36J) for VF delivered    Current Outpatient Medications  Medication Sig Dispense Refill   ACCU-CHEK GUIDE test strip USE AS INSTRUCTED 100 strip 12   Accu-Chek Softclix Lancets lancets Use as instructed 100 each 12   acetaminophen (TYLENOL) 500 MG tablet Take 500 mg by mouth every 6 (six) hours as needed for mild pain (pain score 1-3) or headache.     atorvastatin (LIPITOR) 40 MG tablet Take 1 tablet (40 mg total) by mouth daily. 100 tablet 3   blood glucose meter kit and supplies KIT Dispense based on patient and insurance preference. Use up to four times daily as directed. 1 each 0   cetirizine (ZYRTEC) 10 MG tablet Take 10 mg by mouth daily.     cholecalciferol (VITAMIN D) 25 MCG (1000 UNIT) tablet Take 1 tablet by mouth daily.      clopidogrel (PLAVIX) 75 MG tablet TAKE 1 TABLET BY MOUTH DAILY 100 tablet 2   FARXIGA 10 MG TABS tablet TAKE 1 TABLET BY MOUTH EVERY DAY BEFORE BREAKFAST 90 tablet 3   fenofibrate (TRICOR) 145 MG tablet Take 1 tablet (145 mg total) by mouth daily. 100 tablet 3   fluticasone (FLONASE) 50 MCG/ACT nasal spray USE 2 SPRAYS IN BOTH NOSTRILS  DAILY 64 g 0   furosemide (LASIX) 40 MG tablet Take 1 tablet (40 mg total) by mouth daily. 30 tablet 0   Insulin Lispro Prot & Lispro (HUMALOG MIX 75/25 KWIKPEN) (75-25) 100 UNIT/ML Kwikpen Inject 18 Units into the skin in the morning and at bedtime. 45 mL 3   Insulin Pen Needle (PEN NEEDLES 31GX5/16") 31G X 8 MM MISC Use twice a day for insulin injection, E11.9 100 each 11   metFORMIN (GLUCOPHAGE) 1000 MG tablet TAKE 1 TABLET BY MOUTH TWICE  DAILY WITH A MEAL 200 tablet 2   metoprolol succinate (TOPROL-XL) 100 MG 24 hr tablet Take 1 tablet (100 mg total) by mouth daily. 100 tablet 3   OVER THE COUNTER MEDICATION 1 tablet in  the morning and at bedtime. [facility] - Restore multivitamin     sacubitril-valsartan (ENTRESTO) 97-103 MG TAKE 1 TABLET BY MOUTH TWICE A DAY 180 tablet 1   spironolactone (ALDACTONE) 25 MG tablet TAKE ONE-HALF TABLET BY MOUTH  DAILY 50 tablet 2   tamsulosin (FLOMAX) 0.4 MG CAPS capsule Take 1 capsule (0.4 mg total) by mouth daily. 100 capsule 3   No current facility-administered medications for this encounter.    Allergies  Allergen Reactions   Codeine Other (See Comments)    Causes bad constipation   Dust  Mite Extract Cough   Pollen Extract Cough      Social History   Socioeconomic History   Marital status: Divorced    Spouse name: Not on file   Number of children: 0   Years of education: Not on file   Highest education level: 12th grade  Occupational History   Occupation: Works at [employer] Retired  Tobacco Use   Smoking status: Former    Current packs/day: 0.00    Average packs/day: 2.5 packs/day for 5.0 years (12.5 ttl pk-yrs)    Types: Cigarettes    Start date: 07/06/1973    Quit date: 07/06/1978    Years since quitting: 45.1   Smokeless tobacco: Never   Tobacco comments:    social drinker  Vaping Use   Vaping status: Never Used  Substance and Sexual Activity   Alcohol use: Yes    Comment: OCC.   Drug use: No   Sexual activity: Yes  Other Topics Concern   Not on file  Social History Narrative   Works at [employer] - plans to retire 07/2015 after 36 years.   Divorced, lives alone   Supportive g-friend (shirlee moore)         Social Drivers of Health   Financial Resource Strain: Low Risk  (08/13/2023)   Overall Financial Resource Strain (CARDIA)    Difficulty of Paying Living Expenses: Not very hard  Food Insecurity: No Food Insecurity (08/16/2023)   Hunger Vital Sign    Worried About Running Out of Food in the Last Year: Never true    Ran Out of Food in the Last Year: Never true  Transportation Needs: No Transportation Needs (08/16/2023)   PRAPARE -  Administrator, Civil Service (Medical): No    Lack of Transportation (Non-Medical): No  Physical Activity: Unknown (05/27/2023)   Exercise Vital Sign    Days of Exercise per Week: 0 days    Minutes of Exercise per Session: Not on file  Stress: No Stress Concern Present (05/27/2023)   [employer] of Occupational Health - Occupational Stress Questionnaire    Feeling of Stress : Not at all  Social Connections: Socially Isolated (08/12/2023)   Social Connection and Isolation Panel [NHANES]    Frequency of Communication with Friends and Family: Once a week    Frequency of Social Gatherings with Friends and Family: Once a week    Attends Religious Services: Never    Database administrator or Organizations: No    Attends Banker Meetings: Never    Marital Status: Divorced  Catering manager Violence: Not At Risk (08/16/2023)   Humiliation, Afraid, Rape, and Kick questionnaire    Fear of Current or Ex-Partner: No    Emotionally Abused: No    Physically Abused: No    Sexually Abused: No      Family History  Problem Relation Age of Onset   Diabetes Mother    Stroke Mother    Heart disease Father    Colon cancer Father 42   Diabetes Father    Renal cancer Father    Cancer Father    Sudden death Brother    Heart attack Brother    Esophageal cancer Neg Hx    Rectal cancer Neg Hx    Stomach cancer Neg Hx    Colon polyps Neg Hx     Vitals:   08/20/23 1458  BP: (!) 142/78  Pulse: 67  SpO2: 98%  Weight: 103.6 kg (228 lb 6.4 oz)  Height: 5\' 10"  (1.778 m)    PHYSICAL EXAM: General:  Well appearing. No respiratory difficulty HEENT: normal Neck: supple. no JVD. Carotids 2+ bilat; no bruits. No lymphadenopathy or thryomegaly appreciated. Cor: PMI nondisplaced. Regular rate & rhythm. No rubs, gallops or murmurs. Lungs: clear Abdomen: soft, nontender, nondistended. No hepatosplenomegaly. No bruits or masses. Good bowel sounds. Extremities: no cyanosis,  clubbing, rash, edema Neuro: alert & oriented x 3, cranial nerves grossly intact. moves all 4 extremities w/o difficulty. Affect pleasant.  ECG: not performed    Assessment and Plan   1. Chronic Systolic Heart Failure - NICM  - Echo 11/24 showed EF 45%, RV normal.  - Echo 2/25 drop in EF down to 30-35% w/ Abnormal (paradoxical) septal motion and normal RV. Mitral valve annuloplasty ring stable, mean gradient 3 mmHg.  - LHC 2/25 nonobstructive CAD - Drop in EF may be 2/2 LBBB mediated CM. He has single chamber ICD and LBBB on EKGs, QRS > 150 mg - will refer back to EP to discuss device upgrade to CRT-D - Euvolemic on exam. Wt stable since d/c but thoracic impedence down slightly. Fluid index remains < threshold. - increase spiro to 25 mg daily (labs checked 2 days ago w/ normal SCr/K) - continue Entresto 97-103 mg bid - continue Farxiga 10 mg daily - continue toprol XL 100 mg daily   - continue Lasix 40 mg daily  - check f/u BMP in 1 wk   2. LBBB - dyssynchrony noted on echo, EKGs w/ QRS > 150 ms  - refer for CRT-D upgrade   3. CAD - h/o remote RCA PCI, patent stent on recent LHC + 30% pRCA and 30% RI - medical therapy, Plavix, statin + ? blocker  4. Hypertension  - mildly elevated, increase spiro to 25 mg daily  - check f/u BMP in 1 wk    Referred to HFSW (PCP, Medications, Transportation, ETOH Abuse, Drug Abuse, Insurance, Surveyor, quantity ):  No Refer to Pharmacy:  No Refer to Home Health:  No Refer to Advanced Heart Failure Clinic: No  Refer to General Cardiology: Yes (already established)  Follow up  w/ gen cards next wk as scheduled. Refer back to EP for CRT-D upgrade consideration.  Robbie Lis, PA-C 08/20/2023

## 2023-08-26 ENCOUNTER — Ambulatory Visit: Payer: Medicare Other | Admitting: Physician Assistant

## 2023-08-26 NOTE — Progress Notes (Signed)
 This encounter was created in error - please disregard.

## 2023-08-28 ENCOUNTER — Other Ambulatory Visit (HOSPITAL_COMMUNITY): Payer: Self-pay | Admitting: Cardiology

## 2023-09-06 ENCOUNTER — Ambulatory Visit: Payer: Medicare Other | Attending: Cardiology

## 2023-09-06 DIAGNOSIS — Z95810 Presence of automatic (implantable) cardiac defibrillator: Secondary | ICD-10-CM

## 2023-09-06 DIAGNOSIS — I5022 Chronic systolic (congestive) heart failure: Secondary | ICD-10-CM | POA: Diagnosis not present

## 2023-09-08 ENCOUNTER — Other Ambulatory Visit (HOSPITAL_COMMUNITY): Payer: Self-pay

## 2023-09-08 MED ORDER — FUROSEMIDE 40 MG PO TABS
40.0000 mg | ORAL_TABLET | Freq: Every day | ORAL | 3 refills | Status: DC
  Filled 2023-09-08 – 2023-12-06 (×2): qty 90, 90d supply, fill #0

## 2023-09-08 NOTE — Progress Notes (Signed)
 EPIC Encounter for ICM Monitoring  Patient Name: Christopher [name] is a 73 y.o. male Date: 09/08/2023 Primary Care Physican: Myrlene Broker, MD Primary Cardiologist: [facility] Electrophysiologist: Elberta Fortis 09/25/2022 Weight: 234 lbs 12/04/2022 Weight: 233-235 lbs 05/18/2023 Weight: 229 lbs 07/02/2023 Weight: 230 lbs  09/08/2023 Weight: 226 lbs (has not been weighing daily)   Spoke with patient and heart failure questions reviewed.  Transmission results reviewed.  Pt was hospitalized 2/4 for Acute CHF but he was not aware of any symptoms although ER notes says he was SOB with leg swelling.  Discussed HF symptoms to monitor and encouraged daily weights.     Optivol thoracic impedance suggesting possible fluid accumulation starting 2/24.  Also decreased impedance correlates with hospitalization from 2/4-2/7.    Prescribed: Pt prefers 90 day script be sent to [facility] at [address] Furosemide 40 mg take 1 tablet by mouth daily.  3/5 Pt only has 3 tablets left from discharge prescription and will run out before 3/18 OV with Azalee Course, PA.    Spironolactone 25 mg take 1 tablet (25 mg total) daily   Labs: 06/17/2023 Creatinine 1.43, BUN 20, Potassium 4.3, Sodium 143, GFR 52 A complete set of results can be found in Results Review.   Recommendations: Reviewed HF symptoms to report and discussed limiting salt intake.   Copy sent to Dr Rennis Golden requesting Lasix refill and review of Optivol since hospital discharge.   Follow-up plan: ICM clinic phone appointment on 09/14/2023 to recheck fluid levels.   91 day device clinic remote transmission 5/120/2025.      EP/Cardiology Office Visits:   09/21/2023 with Azalee Course, PA.   Recall 10/27/2023 with Dr Rennis Golden.  10/19/2023 with Dr Elberta Fortis.     Copy of ICM check sent to Dr. Elberta Fortis.   3 month ICM trend: 09/06/2023.    12-14 Month ICM trend:     Karie Soda, RN 09/08/2023 12:13 PM   Sent in refill for Lasix 40 mg daily to [facility]. Would  advise taking 40 mg twice daily for possibly fluid accumulation for the next 3 days, then back to 40 mg daily. Can be re-assessed with Azalee Course, PA-C at appointment on 3/18.Marland Kitchen  Chrystie Nose, MD, [facility], FACP  [place]  [facility]  Medical Director of the Advanced Lipid Disorders &  Cardiovascular Risk Reduction Clinic Diplomate of the American Board of Clinical Lipidology Attending Cardiologist  Direct Dial: [phone]  Fax: [phone]  Website:  [email or web address]

## 2023-09-10 NOTE — Progress Notes (Signed)
 Spoke with patient and advised Dr Rennis Golden sent prescription of Lasix 40 mg daily to [facility].   Advised to take 40 mg twice daily ONLY for possibly fluid accumulation for the next 3 days, then back to 40 mg daily.  Can be re-assessed with Azalee Course, PA-C at appointment on 3/18.Marland Kitchen   He verbalized understanding.  Provided [facility] phone number and address. He will call about the prescription.

## 2023-09-10 NOTE — Progress Notes (Signed)
  Sent in refill for Lasix 40 mg daily to [facility]. Would advise taking 40 mg twice daily for possibly fluid accumulation for the next 3 days, then back to 40 mg daily. Can be re-assessed with Azalee Course, PA-C at appointment on 3/18.Marland Kitchen

## 2023-09-14 ENCOUNTER — Encounter

## 2023-09-16 ENCOUNTER — Telehealth: Payer: Self-pay

## 2023-09-16 ENCOUNTER — Other Ambulatory Visit: Payer: Self-pay | Admitting: Internal Medicine

## 2023-09-16 DIAGNOSIS — E118 Type 2 diabetes mellitus with unspecified complications: Secondary | ICD-10-CM

## 2023-09-16 NOTE — Telephone Encounter (Signed)
 Spoke with patient. Attempted to assist with manual remote transmission but monitor is not working.  Provided [facility] tech support number and advised to call for assistance.    Pt reports he only tookk 1 day of extra Furosemide due to he was worried about his kidneys.  He feels fine today, no SOB or lower extremity swelling.    Will call back 3/17 to check if tech support is able to assist with transmission.Marland Kitchen

## 2023-09-20 ENCOUNTER — Encounter

## 2023-09-20 NOTE — Telephone Encounter (Signed)
 Spoke with patient.  He spoke with [facility] and should receive a monitor in 7-10 days.   Will recheck remote transmission once he receives new monitor.

## 2023-09-21 ENCOUNTER — Encounter: Payer: Self-pay | Admitting: Physician Assistant

## 2023-09-21 ENCOUNTER — Ambulatory Visit: Payer: Medicare Other | Attending: Physician Assistant | Admitting: Physician Assistant

## 2023-09-21 VITALS — BP 108/60 | HR 73 | Ht 70.0 in | Wt 225.5 lb

## 2023-09-21 DIAGNOSIS — E785 Hyperlipidemia, unspecified: Secondary | ICD-10-CM

## 2023-09-21 DIAGNOSIS — Z95810 Presence of automatic (implantable) cardiac defibrillator: Secondary | ICD-10-CM

## 2023-09-21 DIAGNOSIS — Z8673 Personal history of transient ischemic attack (TIA), and cerebral infarction without residual deficits: Secondary | ICD-10-CM | POA: Diagnosis not present

## 2023-09-21 DIAGNOSIS — I428 Other cardiomyopathies: Secondary | ICD-10-CM | POA: Diagnosis not present

## 2023-09-21 DIAGNOSIS — I2510 Atherosclerotic heart disease of native coronary artery without angina pectoris: Secondary | ICD-10-CM | POA: Diagnosis not present

## 2023-09-21 DIAGNOSIS — I10 Essential (primary) hypertension: Secondary | ICD-10-CM

## 2023-09-21 NOTE — Patient Instructions (Signed)
 Medication Instructions:  NO CHANGES *If you need a refill on your cardiac medications before your next appointment, please call your pharmacy*   Lab Work: NO LABS If you have labs (blood work) drawn today and your tests are completely normal, you will receive your results only by: MyChart Message (if you have MyChart) OR A paper copy in the mail If you have any lab test that is abnormal or we need to change your treatment, we will call you to review the results.   Testing/Procedures: NO TESTING   Follow-Up: At [facility], you and your health needs are our priority.  As part of our continuing mission to provide you with exceptional heart care, we have created designated Provider Care Teams.  These Care Teams include your primary Cardiologist (physician) and Advanced Practice Providers (APPs -  Physician Assistants and Nurse Practitioners) who all work together to provide you with the care you need, when you need it.  Your next appointment:   4 month(s)  Provider:   Chrystie Nose, MD   Other Instructions

## 2023-09-21 NOTE — Progress Notes (Unsigned)
 Cardiology Office Note:  .   Date:  09/22/2023  ID:  Christopher [name], DOB March 17, 1951, MRN [id number] PCP: Myrlene Broker, MD   [facility] Providers Cardiologist:  Chrystie Nose, MD Electrophysiologist:  Regan Lemming, MD     History of Present Illness: .   Christopher [name] is a 73 y.o. male with PMH of CAD s/p PCI to RCA 2014, SBE, severe MR s/p repair 2014, cardiac arrest s/p Medtronic single chamber ICD 2018, NICM, postop afib, HTN, HLD, CVA and OSA. Patient was previously followed by Dr. [name] in the office.  He was admitted to [facility] on 05/05/2017 after a syncopal event while at [employer].  He was successfully defibrillated with ROSC and underwent ICD implantation by Dr. Johney Frame.  Left heart cath during that admission showed nonobstructive CAD with moderately reduced LVEF.  Echocardiogram obtained on 05/07/2017 showed EF 40 to 45%.  After the event, patient was followed by Dr. Okey Dupre. By April 2019, ejection fraction has improved to 50 to 55% on echo. Echocardiogram obtained on 01/26/2020 showed EF 55 to 60%, grade 2 DD, elevated LVEDP, mild mitral stenosis, trivial MR, mild AI.  He was admitted in August 2022 for diplopia, workup with MRI revealed acute pontine stroke.  He was placed on aspirin and Plavix with plan to transition to Plavix monotherapy after 3 weeks.  EKG at the time showed sinus rhythm with bifascicular block and PVCs.  Echocardiogram showed EF has dropped down to 30 to 35% with anterior apical and inferior apical wall motion abnormality suggestive of Takotsubo cardiomyopathy versus LAD territory wall motion abnormality.  Given the concern for acute stroke, no ischemic workup was performed.  Patient was started on Entresto after ACE inhibitor washout.  Spironolactone was added.  Repeat echocardiogram in November 2022 showed EF improved to 40 to 45%, mild residual MR with annuloplasty.  Echocardiogram obtained on 05/31/2023 showed EF 45%, annuloplasty present  but no significant MR, no evidence of mitral stenosis, mild AI.  More recently, patient was admitted to the hospital in early February 2025 with progressive dyspnea and lower extremity edema.  He was felt to be volume overloaded and underwent IV diuresis.  Echocardiogram during the hospitalization obtained on 08/12/2023 showed EF dropped down to 30 to 35%, global hypokinesis, annuloplasty ring present with mild MR.  He underwent cardiac catheterization on 08/12/2023 given persistently low EF, this showed 30% ramus lesion, 30% proximal RCA lesion, previously placed distal RCA stent widely patent, normal LVEDP of 14 mmHg.  Medical therapy was recommended.  He was discharged on Lasix 40 mg daily.  Heart failure medication include Entresto, spironolactone, Farxiga and metoprolol succinate.  Since discharge, patient has been seen by heart failure TOC clinic on 08/20/2023, blood pressure was mildly elevated at the time, spironolactone was increased to 25 mg daily.  He was also referred back to EP for consideration of CRT-D upgrade.  Patient presents today for follow-up.  He denies any chest pain or shortness of breath.  He has no lower extremity edema, Fania or PND.  Blood pressure was 108/60.  He is on appropriate heart failure medications at this time.  He has upcoming visit with Dr. Elberta Fortis to decide possible CRT-D upgrade.  He can follow-up with Dr. Rennis Golden in 4 months.   ROS:   He denies chest pain, palpitations, dyspnea, pnd, orthopnea, n, v, dizziness, syncope, edema, weight gain, or early satiety. All other systems reviewed and are otherwise negative except as noted above.  Studies Reviewed: .        Cardiac Studies & Procedures   ______________________________________________________________________________________________ CARDIAC CATHETERIZATION  CARDIAC CATHETERIZATION 08/12/2023  Narrative   Ramus lesion is 30% stenosed.   Prox RCA lesion is 30% stenosed.   Previously placed Dist RCA stent of  unknown type is  widely patent.   LV end diastolic pressure is normal.  Mild nonobstructive CAD. Normal LVEDP 14 mm Hg  Medical management  Findings Coronary Findings Diagnostic  Dominance: Right  Left Main Vessel was injected. Vessel is normal in caliber. Vessel is angiographically normal.  Left Anterior Descending Vessel was injected. Vessel is normal in caliber. There is mild diffuse disease throughout the vessel.  Ramus Intermedius Ramus lesion is 30% stenosed.  Left Circumflex Vessel was injected. Vessel is normal in caliber. Vessel is angiographically normal.  Right Coronary Artery Vessel is large. Prox RCA lesion is 30% stenosed. The lesion is calcified. Previously placed Dist RCA stent of unknown type is  widely patent.  Intervention  No interventions have been documented.   CARDIAC CATHETERIZATION  CARDIAC CATHETERIZATION 05/06/2017  Narrative  There is moderate left ventricular systolic dysfunction.  LV end diastolic pressure is severely elevated.  The left ventricular ejection fraction is 35-45% by visual estimate.  Lat Ramus lesion, 50 %stenosed.  Ramus lesion, 35 %stenosed.  Mid RCA lesion, 25 %stenosed.  Mid RCA to Dist RCA lesion, 0 %stenosed.  Findings Coronary Findings Diagnostic  Dominance: Right  Left Main Vessel was injected. Vessel is large. Vessel is angiographically normal.  Left Anterior Descending Vessel was injected. Vessel is large. Tapers down to a very small caliber vessel that does not reach the apex. Vessel is angiographically normal.  First Diagonal Branch Vessel was injected. Vessel is small in size. Vessel is angiographically normal.  First Septal Branch Vessel was injected. Vessel is small in size. Vessel is angiographically normal.  Second Diagonal Branch Vessel was injected. Vessel is small in size. Vessel is angiographically normal.  Second Septal Branch Vessel was injected. Vessel is small in size. Vessel  is angiographically normal.  Third Septal Branch Vessel was injected. Vessel is small in size. Vessel is angiographically normal.  Ramus Intermedius Vessel was injected. Vessel is normal in caliber. Vessel is angiographically normal. The lesion is eccentric.  Lateral Ramus Intermedius Vessel was injected. Vessel is moderate in size. Vessel is angiographically normal. The lesion is focal.  Left Circumflex Vessel was injected. Vessel is normal in caliber. Vessel is angiographically normal.  First Obtuse Marginal Branch Vessel was injected. Vessel is small in size. Vessel is angiographically normal.  Second Obtuse Marginal Branch Vessel was injected. Vessel is moderate in size. Vessel is angiographically normal.  Lateral Second Obtuse Marginal Branch Vessel was injected. Vessel is small in size. Vessel is angiographically normal.  Right Coronary Artery Vessel was injected. Vessel is large. Vessel is angiographically normal. The lesion is eccentric and irregular. Previously placed Mid RCA to Dist RCA bare metal stent is widely patent. The lesion is located proximal to the major branch.  Acute Marginal Branch Vessel was injected. Vessel is small in size. Vessel is angiographically normal.  Inferior Septal Vessel was injected. Vessel is moderate in size. Vessel is angiographically normal.  First Right Posterolateral Branch Vessel was injected. Vessel is small in size. Vessel is angiographically normal.  Second Right Posterolateral Branch Vessel was injected. Vessel is small in size. Vessel is angiographically normal.  Third Right Posterolateral Branch Vessel was injected. Vessel is moderate in size. Vessel is angiographically  normal.  Intervention  No interventions have been documented.   STRESS TESTS  NM MYOCAR MULTI W/SPECT W 10/10/2007   ECHOCARDIOGRAM  ECHOCARDIOGRAM COMPLETE 08/12/2023  Narrative ECHOCARDIOGRAM REPORT    Patient Name:   Christopher [name] Date of  Exam: 08/12/2023 Medical Rec #:  [id number]      Height:       70.0 in Accession #:    [id number]     Weight:       230.0 lb Date of Birth:  May 24, 1951      BSA:          2.215 m Patient Age:    72 years       BP:           162/90 mmHg Patient Gender: M              HR:           98 bpm. Exam Location:  Inpatient  Procedure: 2D Echo, Cardiac Doppler, Color Doppler and Intracardiac Opacification Agent  Indications:    CHF-Acute Systolic 428.21 / I50.21  History:        Patient has prior history of Echocardiogram examinations, most recent 05/31/2023. CHF, Signs/Symptoms:Shortness of Breath and Chest Pain; Risk Factors:Hypertension and Diabetes.  Mitral Valve: valve is present in the mitral position.  Sonographer:    Webb Laws Referring Phys: Eliezer Mccoy PATEL  IMPRESSIONS   1. Left ventricular ejection fraction, by estimation, is 30 to 35%. The left ventricle has moderately decreased function. The left ventricle demonstrates global hypokinesis. There is mild concentric left ventricular hypertrophy. Left ventricular diastolic function could not be evaluated. Elevated left ventricular end-diastolic pressure. 2. Right ventricular systolic function is normal. The right ventricular size is normal. 3. Status post mitral annuloplasty. Mean gradient 3 mmHG at 98 bpm at, at risk for functional mitral stenosis. The mitral valve is normal in structure. Mild mitral valve regurgitation. No evidence of mitral stenosis. The mean mitral valve gradient is 3.0 mmHg. There is a present in the mitral position. 4. The aortic valve is normal in structure. Aortic valve regurgitation is not visualized. No aortic stenosis is present. 5. The inferior vena cava is normal in size with greater than 50% respiratory variability, suggesting right atrial pressure of 3 mmHg.  FINDINGS Left Ventricle: Left ventricular ejection fraction, by estimation, is 30 to 35%. The left ventricle has moderately decreased function.  The left ventricle demonstrates global hypokinesis. Definity contrast agent was given IV to delineate the left ventricular endocardial borders. The left ventricular internal cavity size was normal in size. There is mild concentric left ventricular hypertrophy. Abnormal (paradoxical) septal motion consistent with post-operative status. Left ventricular diastolic function could not be evaluated due to mitral valve repair. Left ventricular diastolic function could not be evaluated. Elevated left ventricular end-diastolic pressure.  Right Ventricle: The right ventricular size is normal. No increase in right ventricular wall thickness. Right ventricular systolic function is normal.  Left Atrium: Left atrial size was normal in size.  Right Atrium: Right atrial size was normal in size.  Pericardium: There is no evidence of pericardial effusion. Presence of epicardial fat layer.  Mitral Valve: Status post mitral annuloplasty. Mean gradient 3 mmHG at 98 bpm at, at risk for functional mitral stenosis. The mitral valve is normal in structure. Mild mitral valve regurgitation. There is a present in the mitral position. No evidence of mitral valve stenosis. MV peak gradient, 6.7 mmHg. The mean mitral valve gradient is 3.0 mmHg.  Tricuspid Valve: The tricuspid valve is normal in structure. Tricuspid valve regurgitation is not demonstrated. No evidence of tricuspid stenosis.  Aortic Valve: The aortic valve is normal in structure. Aortic valve regurgitation is not visualized. No aortic stenosis is present.  Pulmonic Valve: The pulmonic valve was not well visualized. Pulmonic valve regurgitation is mild. No evidence of pulmonic stenosis.  Aorta: The aortic root is normal in size and structure.  Venous: The inferior vena cava is normal in size with greater than 50% respiratory variability, suggesting right atrial pressure of 3 mmHg.  IAS/Shunts: No atrial level shunt detected by color flow Doppler.   LEFT  VENTRICLE PLAX 2D LVIDd:         6.50 cm      Diastology LVIDs:         5.80 cm      LV e' medial:    4.03 cm/s LV PW:         1.20 cm      LV E/e' medial:  40.7 LV IVS:        1.20 cm      LV e' lateral:   4.79 cm/s LVOT diam:     2.70 cm      LV E/e' lateral: 34.2 LV SV:         91 LV SV Index:   41 LVOT Area:     5.73 cm  LV Volumes (MOD) LV vol d, MOD A2C: 198.0 ml LV vol d, MOD A4C: 194.0 ml LV vol s, MOD A2C: 142.0 ml LV vol s, MOD A4C: 125.0 ml LV SV MOD A2C:     56.0 ml LV SV MOD A4C:     194.0 ml LV SV MOD BP:      62.3 ml  RIGHT VENTRICLE             IVC RV Basal diam:  4.20 cm     IVC diam: 1.60 cm RV Mid diam:    2.30 cm RV S prime:     15.50 cm/s TAPSE (M-mode): 3.8 cm  LEFT ATRIUM             Index        RIGHT ATRIUM           Index LA diam:        3.90 cm 1.76 cm/m   RA Area:     14.60 cm LA Vol (A2C):   42.3 ml 19.10 ml/m  RA Volume:   34.50 ml  15.58 ml/m LA Vol (A4C):   46.7 ml 21.08 ml/m LA Biplane Vol: 47.7 ml 21.53 ml/m AORTIC VALVE LVOT Vmax:   97.75 cm/s LVOT Vmean:  62.250 cm/s LVOT VTI:    0.159 m  AORTA Ao Root diam: 3.30 cm Ao Asc diam:  3.40 cm  MITRAL VALVE MV Area (PHT): 2.32 cm     SHUNTS MV Area VTI:   1.90 cm     Systemic VTI:  0.16 m MV Peak grad:  6.7 mmHg     Systemic Diam: 2.70 cm MV Mean grad:  3.0 mmHg MV Vmax:       1.29 m/s MV Vmean:      78.4 cm/s MV Decel Time: 327 msec MV E velocity: 164.00 cm/s MV A velocity: 93.60 cm/s MV E/A ratio:  1.75  Kardie Tobb DO Electronically signed by Thomasene Ripple DO Signature Date/Time: 08/12/2023/11:09:15 AM    Final      CT SCANS  CT CORONARY MORPH W/CTA  COR W/SCORE 12/07/2022  Addendum 12/11/2022  9:50 PM ADDENDUM REPORT: 12/11/2022 21:48  EXAM: OVER-READ INTERPRETATION  CT CHEST  The following report is an over-read performed by radiologist Dr. Aram Candela of [facility] on 12/11/2022. This over-read does not include interpretation of cardiac or  coronary anatomy or pathology. The coronary calcium score/coronary CTA interpretation by the cardiologist is attached.  COMPARISON:  December 05, 2021  FINDINGS: Cardiovascular: There are no significant extracardiac vascular findings.  Mediastinum/Nodes: There are no enlarged lymph nodes within the visualized mediastinum.  Lungs/Pleura: There is no pleural effusion. Mild posterior right upper lobe and posterior right lower lobe scarring and/or atelectasis is seen. Mild atelectatic changes are also seen along the periphery of both lungs. A very small right pleural effusion is noted.  Upper abdomen: No significant findings in the visualized upper abdomen.  Musculoskeletal/Chest wall: No chest wall mass or suspicious osseous findings within the visualized chest.  IMPRESSION: Mild posterior right upper lobe and posterior lower lobe atelectasis with a very small right pleural effusion.   Electronically Signed By: Aram Candela M.D. On: 12/11/2022 21:48  Narrative HISTORY: ESSENCE research trial patient  EXAM: Cardiac/Coronary CTA  TECHNIQUE: The patient was scanned on a [employer].  PROTOCOL: A 120 kV prospective scan was triggered in the descending thoracic aorta at 111 HU's. Axial non-contrast 3 mm slices were carried out through the heart. The data set was analyzed on a dedicated work station and scored using the Agatson method. Gantry rotation speed was 250 msecs and collimation was .6 mm. Beta blockade and 0.8 mg of sl NTG was given. The 3D data set was reconstructed in 5% intervals of the 35-75 % of the R-R cycle. Diastolic phases were analyzed on a dedicated work station using MPR, MIP and VRT modes. The patient received 95mL OMNIPAQUE IOHEXOL 350 MG/ML SOLN contrast.  FINDINGS: Quality: Good, HR 66  Coronary calcium score: Not performed due to presence of coronary stent.  Coronary arteries: Normal coronary origins.  Right dominance.  Right  Coronary Artery: Dominant. The proximal portion of the artery is heavily calcified and there is likely 70-99% stenosis. The stent, itself, appears patent with mild in-stent restenosis. There is a left to right collateral noted that fills the RCA retrograde. The R-PDA is moderately stenotic at the ostium.  Left Main Coronary Artery: Minimal mixed distal 1-24% stenosis. Bifurcates into the LAD and LCX arteries.  Left Anterior Descending Coronary Artery: Anterior artery that reaches the apex. Minimal 1-24% mixed proximal stenosis.  Left Circumflex Artery: Smaller caliber AV groove vessel, normal. High OM branch with mild to moderate (50-69%) proximal calcified stenosis, just prior to bifurcation.  Aorta: Normal size, 35 mm at the mid ascending aorta (level of the PA bifurcation) measured double oblique. No calcifications. No dissection.  Aortic Valve: Trileaflet. No calcifications.  Other findings:  Normal pulmonary vein drainage into the left atrium.  Normal left atrial appendage without a thrombus.  Normal size of the pulmonary artery.  S/p Mitral valve repair with annuloplasty ring  Single lead AICD in the RA/RV  IMPRESSION: 1. Severe proximal mixed stenosis of the RCA which is likely occluded, just prior to the stented mid-vessel. There are left to right collaterals, CADRADS = 4/5. The RCA could not be modeled due to prior stent, therefore, FFR was not performed. There is also moderate proximal mixed stenosis of a high OM branch.  2. Coronary artery calcium scoring was note performed due to the presence of a coronary  stent.  3. Normal coronary origin with right dominance.  4. Comparison to a prior coronary CT dated 12/05/2021, demonstrates progression of CAD in the RCA, which is now likely occluded or subtotally occluded upstream previously placed stent, noting left to right collaterals. Prior FFR suggested a value of 0.66 in the distal RCA, consistent with severe  flow-limitation, however, FFR is not validated in stented vessels.  5. Continued aggressive medical therapy is recommended unless the patient is having anginal symptoms.  Electronically Signed: By: Chrystie Nose M.D. On: 12/07/2022 15:40   CT SCANS  CT CORONARY FRACTIONAL FLOW RESERVE DATA PREP 12/05/2021  Narrative CLINICAL DATA:  CAD Previous RCA stent  EXAM: FFR CT  TECHNIQUE: The best systolic and diastolic phases of the patients gated cardiac CTA sent to Heart Flow for hemodynamic analysis  FINDINGS: FFR of left sided coronary system normal including area of concern in large branching high OM  FFR positive in mid/distal RCA 0.66 in mid vessel in lesion proximal to RCA stent and 0.66 in area including  RCA stent and distal to it  Validity of FFR in setting of stent unclear  IMPRESSION: FFR CT suggesting obstructive CAD proximal to RCA stent  Charlton Haws   Electronically Signed By: Charlton Haws M.D. On: 12/05/2021 16:49   CT SCANS  CT CORONARY MORPH W/CTA COR W/SCORE 12/05/2021  Addendum 12/08/2021  1:59 PM ADDENDUM REPORT: 12/08/2021 13:57  EXAM: OVER-READ INTERPRETATION  CT CHEST  The following report is an over-read performed by radiologist Dr. Marinda Elk [facility] on 12/08/2021. This over-read does not include interpretation of cardiac or coronary anatomy or pathology. The coronary CTA interpretation by the cardiologist is attached.  COMPARISON:  Prior CT of the chest on 12/07/2012  FINDINGS: Atherosclerosis of the thoracic aorta without evidence of aneurysm. Visualized mediastinum and hilar regions demonstrate no lymphadenopathy or masses. Visualized lungs demonstrate interstitial septal thickening especially in the periphery of the lungs which may reflect chronic disease. No overt airspace consolidation, edema, pneumothorax or nodule identified. Visualized upper abdomen and bony structures are  unremarkable.  IMPRESSION: 1. Atherosclerosis of the thoracic aorta. 2. Some degree of underlying interstitial lung disease is suspected.   Electronically Signed By: Irish Lack M.D. On: 12/08/2021 13:57  Narrative CLINICAL DATA:  Chest pain  EXAM: Cardiac CTA  MEDICATIONS: Sub lingual nitro. 4mg  and lopressor 100mg   TECHNIQUE: The patient was scanned on a Siemens Force 192 slice scanner. Gantry rotation speed was 250 msecs. Collimation was .6 mm. A 100 kV prospective scan was triggered in the ascending thoracic aorta at 140 HU's Full mA was used between 35% and 75% of the R-R interval. Average HR during the scan was 65 bpm. The 3D data set was interpreted on a dedicated work station using MPR, MIP and VRT modes. A total of 80 cc of contrast was used.  Sub-optimal study as patient had PVCls despite iv lopressor Artifact from pacing wires and mitral ring. No calcium score  Done due to history of RCA stent  FINDINGS: Non-cardiac: See separate report from [facility]. No significant findings on limited lung and soft tissue windows.  Coronary Arteries: Right dominant with no anomalies  LM: 1-24% calcified ostial stenosis  LAD: 1-24% proximal / mid vessel calcified stenosis  D1: Normal  D2: Normal  Circumflex: Normal  OM1: Large branching high take off (like intermediate branch) vessel 50-69% calcified plaque at proximal vessel before branching 1-24% calcified plaque in medial branch  OM2: Normal  RCA:  25-49% calcified plaque in proximal vessel 70-99% calcified plaque in mid vessel Patent stent in distal RCA  PDA: 25-49% calcified plaque in ostial  vessel  PLA: 25-49% calcified plaque proximal vessel  IMPRESSION: 1. Calcium score not done due to presence of stent  2.  CAD RADS 4 obstructive CAD in mid RCA proximal to stent  3.  Moderate OM 1 disease  4.  Single lead AICD lead in RA/RV  5.  S/P mitral repair with annuloplasty ring  6.   Normal ascending thoracic aorta 3.5 cm  Study will be sent for FFR CT but not clear of validity in RCA in presence of stent  Charlton Haws  Electronically Signed: By: Charlton Haws M.D. On: 12/05/2021 16:40     ______________________________________________________________________________________________      Risk Assessment/Calculations:             Physical Exam:   VS:  BP 108/60   Pulse 73   Ht 5\' 10"  (1.778 m)   Wt 225 lb 8 oz (102.3 kg)   SpO2 93%   BMI 32.36 kg/m    Wt Readings from Last 3 Encounters:  09/21/23 225 lb 8 oz (102.3 kg)  08/20/23 228 lb 6.4 oz (103.6 kg)  08/18/23 234 lb (106.1 kg)    GEN: Well nourished, well developed in no acute distress NECK: No JVD; No carotid bruits CARDIAC: RRR, no murmurs, rubs, gallops RESPIRATORY:  Clear to auscultation without rales, wheezing or rhonchi  ABDOMEN: Soft, non-tender, non-distended EXTREMITIES:  No edema; No deformity   ASSESSMENT AND PLAN: .    NICM HFrEF with fluctuating ejection fraction, currently at 30-35%. Recent hospitalization for dyspnea and edema, treated with IV diuresis. No severe coronary artery disease or valve issues. Possible dyssynchrony from chronic left bundle branch block. On optimal heart failure regimen. Consideration for CRT-D upgrade due to dyssynchrony from right bundle branch block. - Continue current heart failure medication regimen.  CAD: Denies any recent chest pain.  No significant disease to explain the drop in the ejection fraction.  History of ICD: Heart failure therapy optimized.  Follow-up with the EP service with consideration of CRT-D upgrade.  Hypertension Mildly elevated blood pressure during recent heart failure TOC clinic visit. Well-managed on current regimen. - Continue current antihypertensive regimen.  Hyperlipidemia Managed with atorvastatin as part of heart failure management. - Continue atorvastatin.  History of CVA: No recurrence    Dispo: Follow-up  with Dr. Rennis Golden in 3-4 both  Signed, Azalee Course, [place]

## 2023-09-22 ENCOUNTER — Ambulatory Visit (INDEPENDENT_AMBULATORY_CARE_PROVIDER_SITE_OTHER): Admitting: Podiatry

## 2023-09-22 ENCOUNTER — Encounter: Payer: Self-pay | Admitting: Podiatry

## 2023-09-22 DIAGNOSIS — Z89421 Acquired absence of other right toe(s): Secondary | ICD-10-CM

## 2023-09-22 DIAGNOSIS — B351 Tinea unguium: Secondary | ICD-10-CM

## 2023-09-22 DIAGNOSIS — M79675 Pain in left toe(s): Secondary | ICD-10-CM

## 2023-09-22 DIAGNOSIS — M2012 Hallux valgus (acquired), left foot: Secondary | ICD-10-CM

## 2023-09-22 DIAGNOSIS — E118 Type 2 diabetes mellitus with unspecified complications: Secondary | ICD-10-CM

## 2023-09-22 DIAGNOSIS — M79674 Pain in right toe(s): Secondary | ICD-10-CM

## 2023-09-22 DIAGNOSIS — M2011 Hallux valgus (acquired), right foot: Secondary | ICD-10-CM

## 2023-09-22 NOTE — Progress Notes (Signed)
 Remote ICD transmission.

## 2023-09-22 NOTE — Addendum Note (Signed)
 Addended by: Geralyn Flash D on: 09/22/2023 04:28 PM   Modules accepted: Orders

## 2023-09-24 ENCOUNTER — Ambulatory Visit: Attending: Cardiology

## 2023-09-24 DIAGNOSIS — Z95810 Presence of automatic (implantable) cardiac defibrillator: Secondary | ICD-10-CM

## 2023-09-24 DIAGNOSIS — I5022 Chronic systolic (congestive) heart failure: Secondary | ICD-10-CM

## 2023-09-24 NOTE — Progress Notes (Signed)
 EPIC Encounter for ICM Monitoring  Patient Name: Christopher [name] is a 73 y.o. male Date: 09/24/2023 Primary Care Physican: Myrlene Broker, MD Primary Cardiologist: [facility] Electrophysiologist: Elberta Fortis 09/25/2022 Weight: 234 lbs 12/04/2022 Weight: 233-235 lbs 05/18/2023 Weight: 229 lbs 07/02/2023 Weight: 230 lbs  09/08/2023 Weight: 226 lbs (has not been weighing daily) 09/21/2023 Office Weight: 225 lbs   Spoke with patient and heart failure questions reviewed.  Transmission results reviewed.  Pt asymptomatic for fluid accumulation.  Reports feeling well at this time and voices no complaints.     Optivol thoracic impedance suggesting fluid levels returned to normal.    Prescribed:  Furosemide 40 mg take 1 tablet by mouth daily.     Spironolactone 25 mg take 1 tablet (25 mg total) daily   Labs: 08/18/2023 Creatinine 1.35, BUN 27, Potassium 3.8, Sodium 138  08/13/2023 Creatinine 1.40, BUN 18, Potassium 3.3, Sodium 139, GFR 53  08/12/2023 Creatinine 1.49, BUN 18, Potassium 3.3, Sodium 142, GFR 49  08/10/2023 Creatinine 1.55, BUN 19, Potassium 3.9, Sodium 136, GFR 47 A complete set of results can be found in Results Review.   Recommendations: No changes and encouraged to call if experiencing any fluid symptoms.    Follow-up plan: ICM clinic phone appointment on 10/11/2023.   91 day device clinic remote transmission 11/15/2023.      EP/Cardiology Office Visits:    Recall 12/20/2023 with Dr Rennis Golden.  10/19/2023 with Dr Elberta Fortis.     Copy of ICM check sent to Dr. Elberta Fortis.   3 month ICM trend: 09/22/2023.    12-14 Month ICM trend:     Karie Soda, RN 09/24/2023 11:08 AM

## 2023-09-27 ENCOUNTER — Encounter: Payer: Self-pay | Admitting: Podiatry

## 2023-09-27 NOTE — Progress Notes (Signed)
 Subjective: Chief Complaint  Patient presents with   Diabetes    "Trim my toenails."  Saw Dr. Hillard Danker 08/18/2023, Alc - 5.10 May 2023 N - toenails L - 1-5 bilateral D - 1 month O - gradually worse C - Diabetic, thick A - none T - I usually cut them but it's hard for me to see them.    Christopher [name] presents today for diabetic foot evaluation.  Patient relates h/o diabetes.  Patient denies any h/o foot wounds. He does have ampuation of right 2nd digit from on the job injury and not secondary to diabetes.  PCP is Myrlene Broker, MD.  Past Medical History:  Diagnosis Date   Arthritis    Atrial fibrillation [facility])    post op, intol of anticoag   Cardiac arrest [facility])    a. s/p MDT single chamber ICD; 11-10-18- pt denies having a heart attack   Cataract    CHF (congestive heart failure) (HCC)    Chronic kidney disease    kidney stone   Coronary artery disease    a. 2/7 Cath: LM nl, LAD min irregs, LCX min irregs, RI 40, RCA 35m, EF 55-60% basal to mid inf HK, 3-4+ MR;  b. 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 Mitral valve prolapse with flail segment of posterior leaflet and severe MR by TEE, remote h/o bacterial endocarditis    Sleep apnea    NPSG 01/21/06- AHI 40.7/hr cpap   Stroke [facility])    summer 2022- one messed up vision, the other balance   Subacute bacterial endocarditis 03/22/2008   Strep viridans   Ventricular fibrillation (HCC) 11/07/2019    appropriate shock (36J) for VF delivered    Patient Active Problem List   Diagnosis Date Noted   Chronic low back pain 05/22/2022   Research study patient 11/28/2021   GERD without esophagitis 02/15/2021   Diabetes mellitus type 2 with complications (HCC) 02/15/2021   Vertigo as late effect of stroke 02/14/2021   Ventricular tachycardia (HCC) 01/03/2020   Nonischemic cardiomyopathy (HCC) 09/27/2017   Heart valve disease 09/27/2017   History of cardiac arrest 09/27/2017   Alcohol use 05/05/2017   Coronary artery disease involving native coronary artery of native heart without angina pectoris 05/05/2017   OSA on CPAP 05/05/2017   History of repair of mitral valve 05/05/2017   Benign prostatic hyperplasia 05/16/2015   Routine general medical examination at a health care facility 11/13/2014   Acute on chronic systolic CHF (congestive heart failure) (HCC) 10/25/2012   AF (paroxysmal atrial fibrillation) (HCC) 10/25/2012   Hyperlipidemia associated with type 2 diabetes mellitus (HCC) 09/04/2012   Severe mitral regurgitation    Essential hypertension 09/27/2008    Past Surgical History:  Procedure Laterality Date   AMPUTATION  07/28/2012   Procedure: AMPUTATION DIGIT;  Surgeon: [name], MD;  Location: [facility] ORS;  Service: Orthopedics;  Laterality: Right;  2nd toe   CARDIAC  CATHETERIZATION     COLONOSCOPY     EP IMPLANTABLE DEVICE N/A 05/01/2016   Procedure: Loop Recorder Removal;  Surgeon: [name] Range, MD;  Location: [facility] INVASIVE CV LAB;  Service: Cardiovascular;  Laterality: N/A;   FOOT SURGERY     BILATERAL TOES   ICD IMPLANT N/A 05/07/2017    Medtronic Visia AF MRI VR SureScan implanted by Dr Elberta Fortis following VF arrest   Implantable loop recorder placement  03/31/2013   MDT Linq implanted by Dr Johney Frame to evaluate for further afib   INTRAOPERATIVE TRANSESOPHAGEAL ECHOCARDIOGRAM N/A 09/27/2012   Procedure: INTRAOPERATIVE TRANSESOPHAGEAL ECHOCARDIOGRAM;  Surgeon: [name], MD;  Location: [facility] OR;  Service: Open Heart Surgery;  Laterality: N/A;   LEFT AND RIGHT HEART CATHETERIZATION WITH CORONARY ANGIOGRAM N/A 08/12/2012   Procedure: LEFT AND RIGHT HEART CATHETERIZATION WITH CORONARY ANGIOGRAM;  Surgeon: [name], MD;  Location: [facility] CATH LAB;  Service: Cardiovascular;  Laterality: N/A;   LEFT HEART CATH AND CORONARY ANGIOGRAPHY N/A 05/06/2017   Procedure: LEFT HEART CATH AND CORONARY ANGIOGRAPHY;  Surgeon: Marykay Lex, MD;  Location: [facility] INVASIVE CV LAB;  Service: Cardiovascular;  Laterality: N/A;   LEFT HEART CATH AND CORONARY ANGIOGRAPHY N/A 08/12/2023   Procedure: LEFT HEART CATH AND CORONARY ANGIOGRAPHY;  Surgeon: Swaziland, Peter M, MD;  Location: [facility] INVASIVE CV LAB;  Service: Cardiovascular;  Laterality: N/A;   LOOP RECORDER IMPLANT N/A 03/31/2013   Procedure: LOOP RECORDER IMPLANT;  Surgeon: Gardiner Rhyme, MD;  Location: [facility] CATH LAB;  Service: Cardiovascular;  Laterality: N/A;   MITRAL VALVE REPAIR Right 09/27/2012   Procedure: MINIMALLY INVASIVE MITRAL VALVE REPAIR (MVR);  Surgeon: [name], MD;  Location: [facility] OR;  Service: Open Heart Surgery;  Laterality: Right;  Ultrasound guided   PERCUTANEOUS CORONARY STENT INTERVENTION (PCI-S) N/A 08/25/2012   Procedure: PERCUTANEOUS CORONARY STENT INTERVENTION (PCI-S);  Surgeon: Tonny Bollman, MD;  Location: [facility] CATH LAB;  Service: Cardiovascular;  Laterality: N/A;   POLYPECTOMY     SEPTOPLASTY     Dr. [name]   TEE WITHOUT CARDIOVERSION N/A 08/12/2012   Procedure: TRANSESOPHAGEAL ECHOCARDIOGRAM (TEE);  Surgeon: [name], MD;  Location: [facility] ENDOSCOPY;  Service: Cardiovascular;  Laterality: N/A;   TONSILLECTOMY     UVULOPALATOPHARYNGOPLASTY      Current Outpatient Medications on File Prior to Visit  Medication Sig Dispense Refill   ACCU-CHEK GUIDE test strip USE AS INSTRUCTED 100 strip 12   Accu-Chek Softclix Lancets lancets USE AS INSTRUCTED 100 each 12   acetaminophen (TYLENOL) 500 MG  tablet Take 500 mg by mouth every 6 (six) hours as needed for mild pain (pain score 1-3) or headache.     atorvastatin (LIPITOR) 40 MG tablet Take 1 tablet (40 mg total) by mouth daily. 100 tablet 3   blood glucose meter kit and supplies KIT Dispense based on patient and insurance preference. Use up to four times daily as directed. 1 each 0   cetirizine (ZYRTEC) 10 MG tablet Take 10 mg by mouth daily.     cholecalciferol (VITAMIN D) 25 MCG (1000 UNIT) tablet Take 1 tablet by mouth daily.      clopidogrel (PLAVIX) 75 MG tablet TAKE 1 TABLET BY MOUTH DAILY 100 tablet 2   FARXIGA 10 MG TABS tablet TAKE 1 TABLET BY MOUTH EVERY DAY BEFORE BREAKFAST 90 tablet 3   fenofibrate (TRICOR) 145 MG tablet Take 1 tablet (145 mg total) by mouth daily. 100 tablet 3   fluticasone (FLONASE) 50 MCG/ACT nasal  spray USE 2 SPRAYS IN BOTH NOSTRILS  DAILY 64 g 0   furosemide (LASIX) 40 MG tablet Take 1 tablet (40 mg total) by mouth daily. 90 tablet 3   Insulin Lispro Prot & Lispro (HUMALOG MIX 75/25 KWIKPEN) (75-25) 100 UNIT/ML Kwikpen Inject 18 Units into the skin in the morning and at bedtime. 45 mL 3   Insulin Pen Needle (PEN NEEDLES 31GX5/16") 31G X 8 MM MISC Use twice a day for insulin injection, E11.9 100 each 11   metFORMIN (GLUCOPHAGE) 1000 MG tablet TAKE 1 TABLET BY MOUTH TWICE  DAILY WITH A MEAL 200 tablet 2   metoprolol succinate (TOPROL-XL) 100 MG 24 hr tablet Take 1 tablet (100 mg total) by mouth daily. 100 tablet 3   OVER THE COUNTER MEDICATION 1 tablet in the morning and at bedtime. [facility] - Restore multivitamin     sacubitril-valsartan (ENTRESTO) 97-103 MG Take 1 tablet by mouth 2 (two) times daily. 180 tablet 2   spironolactone (ALDACTONE) 25 MG tablet TAKE 1 TABLET BY MOUTH DAILY 90 tablet 2   tamsulosin (FLOMAX) 0.4 MG CAPS capsule Take 1 capsule (0.4 mg total) by mouth daily. 100 capsule 3   No current facility-administered medications on file prior to visit.     Allergies  Allergen Reactions    Codeine Other (See Comments)    Causes bad constipation   Dust Mite Extract Cough   Pollen Extract Cough    Social History   Occupational History   Occupation: Works at [employer] Retired  Tobacco Use   Smoking status: Former    Current packs/day: 0.00    Average packs/day: 2.5 packs/day for 5.0 years (12.5 ttl pk-yrs)    Types: Cigarettes    Start date: 07/06/1973    Quit date: 07/06/1978    Years since quitting: 45.2   Smokeless tobacco: Never   Tobacco comments:    social drinker  Vaping Use   Vaping status: Never Used  Substance and Sexual Activity   Alcohol use: Yes    Comment: OCC.   Drug use: No   Sexual activity: Yes    Family History  Problem Relation Age of Onset   Diabetes Mother    Stroke Mother    Heart disease Father    Colon cancer Father 91   Diabetes Father    Renal cancer Father    Cancer Father    Sudden death Brother    Heart attack Brother    Esophageal cancer Neg Hx    Rectal cancer Neg Hx    Stomach cancer Neg Hx    Colon polyps Neg Hx     Immunization History  Administered Date(s) Administered   Fluad Quad(high Dose 65+) 03/04/2019, 05/16/2020, 05/20/2021   Influenza, High Dose Seasonal PF 05/03/2015, 04/15/2017, 04/22/2018, 05/04/2022   Influenza,inj,Quad PF,6+ Mos 04/09/2014, 05/11/2016   Influenza-Unspecified 04/26/2013, 05/09/2015, 04/28/2023   PFIZER(Purple Top)SARS-COV-2 Vaccination 08/18/2019, 09/10/2019, 05/18/2020, 01/24/2021   Pfizer Covid-19 Vaccine Bivalent Booster 61yrs & up 05/04/2022   Pneumococcal Conjugate-13 11/13/2014   Pneumococcal Polysaccharide-23 05/11/2016   Pneumococcal-Unspecified 02/24/2010   Tdap 08/20/2010   Zoster Recombinant(Shingrix) 04/26/2017, 07/01/2017   Zoster, Live 08/06/2009    Objective: There were no vitals filed for this visit.  Christopher [name] is a pleasant 73 y.o. male obese in NAD. AAO X 3.   Diabetic foot exam was performed with the following findings:   Normal sensation of 10g  monofilament Intact posterior tibialis and dorsalis pedis pulses Vascular Examination: Capillary refill time  immediate b/l. Vascular status intact b/l with palpable pedal pulses. Pedal hair present b/l. No pain with calf compression b/l. Skin temperature gradient WNL b/l. No cyanosis or clubbing b/l. No ischemia or gangrene noted b/l.   Neurological Examination: Sensation grossly intact b/l with 10 gram monofilament. Vibratory sensation intact b/l.   Dermatological Examination: Pedal skin with normal turgor, texture and tone b/l.  No open wounds. No interdigital macerations.   Toenails 1-5  left, right great toe and 3-5 right thick, discolored, elongated with subungual debris and pain on dorsal palpation.   No corns, calluses nor porokeratotic lesions noted.  Musculoskeletal Examination: Muscle strength 5/5 to all lower extremity muscle groups bilaterally. Lower extremity amputation(s): digital amputation R 2nd toe. HAV with bunion deformity noted b/l LE. Patient ambulates independent of any assistive aids.  Radiographs: None      Lab Results  Component Value Date   HGBA1C 5.4 05/31/2023   Assessment: 1. Pain due to onychomycosis of toenails of both feet   2. History of amputation of lesser toe of right foot (HCC)   3. Hallux valgus, acquired, bilateral   4. Diabetes mellitus type 2 with complications (HCC)     ADA Risk Categorization: Low Risk:  Patient has all of the following: Intact protective sensation No prior foot ulcer  No severe deformity Pedal pulses present  Plan: Diabetic foot examination performed today. Patient has h/o toe amputation right 2nd digit preceding his diagnosis of diabetes. This was due to fracture of digit from on the job injury. All patient's and/or POA's questions/concerns addressed on today's visit. Toenails 1-5 left foot, 3-5 right foot, and right great toe debrided in length and girth without incident. Monitor blood glucose per  PCP/Endocrinologist's recommendations.Continue soft, supportive shoe gear daily. Report any pedal injuries to medical professional. Call office if there are any questions/concerns. -Patient/POA to call should there be question/concern in the interim.  Return in about 3 months (around 12/23/2023).  Freddie Breech, DPM      [place] LOCATION: [address] [place] [address]                   Office [phone]   [facility] LOCATION: [address] [place], [place] [address] Office [phone]

## 2023-10-04 ENCOUNTER — Encounter

## 2023-10-07 DIAGNOSIS — Z85828 Personal history of other malignant neoplasm of skin: Secondary | ICD-10-CM | POA: Diagnosis not present

## 2023-10-07 DIAGNOSIS — L821 Other seborrheic keratosis: Secondary | ICD-10-CM | POA: Diagnosis not present

## 2023-10-07 DIAGNOSIS — L570 Actinic keratosis: Secondary | ICD-10-CM | POA: Diagnosis not present

## 2023-10-07 DIAGNOSIS — D1801 Hemangioma of skin and subcutaneous tissue: Secondary | ICD-10-CM | POA: Diagnosis not present

## 2023-10-07 DIAGNOSIS — D225 Melanocytic nevi of trunk: Secondary | ICD-10-CM | POA: Diagnosis not present

## 2023-10-07 DIAGNOSIS — Z85820 Personal history of malignant melanoma of skin: Secondary | ICD-10-CM | POA: Diagnosis not present

## 2023-10-11 ENCOUNTER — Encounter

## 2023-10-13 ENCOUNTER — Ambulatory Visit: Attending: Cardiology

## 2023-10-13 ENCOUNTER — Telehealth: Payer: Self-pay

## 2023-10-13 DIAGNOSIS — Z95810 Presence of automatic (implantable) cardiac defibrillator: Secondary | ICD-10-CM | POA: Diagnosis not present

## 2023-10-13 DIAGNOSIS — I5022 Chronic systolic (congestive) heart failure: Secondary | ICD-10-CM | POA: Diagnosis not present

## 2023-10-13 NOTE — Progress Notes (Signed)
 EPIC Encounter for ICM Monitoring  Patient Name: Christopher [name] is a 73 y.o. male Date: 10/13/2023 Primary Care Physican: Myrlene Broker, MD Primary Cardiologist: [facility] Electrophysiologist: Elberta Fortis 09/25/2022 Weight: 234 lbs 12/04/2022 Weight: 233-235 lbs 05/18/2023 Weight: 229 lbs 07/02/2023 Weight: 230 lbs  09/08/2023 Weight: 226 lbs (has not been weighing daily) 09/21/2023 Office Weight: 225 lbs   Attempted call to patient and unable to reach.  Transmission results reviewed.      Optivol thoracic impedance suggesting possible fluid accumulation starting 3/27 but trending back toward baseline.    Prescribed:  Furosemide 40 mg take 1 tablet by mouth daily.     Spironolactone 25 mg take 1 tablet (25 mg total) daily   Labs: 08/18/2023 Creatinine 1.35, BUN 27, Potassium 3.8, Sodium 138  08/13/2023 Creatinine 1.40, BUN 18, Potassium 3.3, Sodium 139, GFR 53  08/12/2023 Creatinine 1.49, BUN 18, Potassium 3.3, Sodium 142, GFR 49  08/10/2023 Creatinine 1.55, BUN 19, Potassium 3.9, Sodium 136, GFR 47 A complete set of results can be found in Results Review.   Recommendations:  Unable to reach.     Follow-up plan: ICM clinic phone appointment on 11/16/2023 (fluid levels will be rechecked at 4/15 OV).   91 day device clinic remote transmission 11/15/2023.      EP/Cardiology Office Visits:    Recall 12/20/2023 with Dr Rennis Golden.  10/19/2023 with Dr Elberta Fortis.     Copy of ICM check sent to Dr. Elberta Fortis.  3 month ICM trend: 10/13/2023.    12-14 Month ICM trend:     Karie Soda, RN 10/13/2023 7:55 AM

## 2023-10-13 NOTE — Telephone Encounter (Signed)
 Remote ICM transmission received.  Attempted call to patient regarding ICM remote transmission and no answer.

## 2023-10-19 ENCOUNTER — Ambulatory Visit (INDEPENDENT_AMBULATORY_CARE_PROVIDER_SITE_OTHER)

## 2023-10-19 ENCOUNTER — Ambulatory Visit: Payer: Medicare Other | Attending: Cardiology | Admitting: Cardiology

## 2023-10-19 ENCOUNTER — Encounter: Payer: Self-pay | Admitting: Cardiology

## 2023-10-19 VITALS — BP 132/78 | HR 64 | Ht 70.0 in | Wt 227.0 lb

## 2023-10-19 DIAGNOSIS — I493 Ventricular premature depolarization: Secondary | ICD-10-CM

## 2023-10-19 DIAGNOSIS — I2510 Atherosclerotic heart disease of native coronary artery without angina pectoris: Secondary | ICD-10-CM

## 2023-10-19 DIAGNOSIS — Z95810 Presence of automatic (implantable) cardiac defibrillator: Secondary | ICD-10-CM | POA: Diagnosis not present

## 2023-10-19 DIAGNOSIS — I10 Essential (primary) hypertension: Secondary | ICD-10-CM | POA: Diagnosis not present

## 2023-10-19 DIAGNOSIS — I428 Other cardiomyopathies: Secondary | ICD-10-CM

## 2023-10-19 DIAGNOSIS — I5022 Chronic systolic (congestive) heart failure: Secondary | ICD-10-CM

## 2023-10-19 NOTE — Progress Notes (Signed)
  Electrophysiology Office Note:   Date:  10/19/2023  ID:  Christopher [name], DOB 04-17-1951, MRN [id number]  Primary Cardiologist: Hazle Lites, MD Primary Heart Failure: None Electrophysiologist: Salif Tay Cortland Ding, MD      History of Present Illness:   Christopher [name] is a 73 y.o. male with h/o heart failure, coronary artery disease, ventricular fibrillation, mitral valve repair for endocarditis, CVA, hypertension seen today for routine electrophysiology followup.   Since last being seen in our clinic the patient reports doing overall well today.  He was in the hospital with a heart failure exacerbation in February.  His medications were adjusted.  Since then, he has some mild fatigue, but otherwise is without major complaint.  he denies chest pain, palpitations, dyspnea, PND, orthopnea, nausea, vomiting, dizziness, syncope, edema, weight gain, or early satiety.   Review of systems complete and found to be negative unless listed in HPI.      EP Information / Studies Reviewed:    EKG is ordered today. Personal review as below.      ICD Interrogation-  reviewed in detail today,  See PACEART report.  Device History: Medtronic Single Chamber ICD implanted 2018 for chronic systolic heart failure History of appropriate therapy: Yes History of AAD therapy: No   Risk Assessment/Calculations:              Physical Exam:   VS:  BP 132/78 (BP Location: Left Arm, Patient Position: Sitting, Cuff Size: Large)   Pulse 64   Ht 5\' 10"  (1.778 m)   Wt 227 lb (103 kg)   SpO2 95%   BMI 32.57 kg/m    Wt Readings from Last 3 Encounters:  10/19/23 227 lb (103 kg)  09/21/23 225 lb 8 oz (102.3 kg)  08/20/23 228 lb 6.4 oz (103.6 kg)     GEN: Well nourished, well developed in no acute distress NECK: No JVD; No carotid bruits CARDIAC: Regular rate and rhythm with occasional ectopy, no murmurs, rubs, gallops RESPIRATORY:  Clear to auscultation without rales, wheezing or rhonchi  ABDOMEN:  Soft, non-tender, non-distended EXTREMITIES:  No edema; No deformity   ASSESSMENT AND PLAN:    Chronic systolic dysfunction s/p Medtronic single chamber ICD  euvolemic today Stable on an appropriate medical regimen Normal ICD function See Pace Art report Sensing, threshold, impedance within normal limits Programming reviewed and stable for patient No changes today  2.  Coronary artery disease: No ischemic symptoms  3.  History of ventricular fibrillation: No further episodes  4.  Hypertension: Well-controlled  5.  PVCs: Appears to have a significantly elevated burden.  Burden is increased from prior based on ICD interrogation.  Callin Ashe have him wear a 2-week monitor to get a full estimate of his PVC burden.  Elevated PVCs could potentially be the cause of his reduced ejection fraction.  Disposition:   Follow up with Dr. Lawana Pray  post cardiac monitor    Signed, Octavious Zidek Cortland Ding, MD

## 2023-10-19 NOTE — Progress Notes (Unsigned)
 Enrolled for [employer] to mail a ZIO XT long term holter monitor to the patients address on file.

## 2023-10-19 NOTE — Patient Instructions (Signed)
 Medication Instructions:  Your physician recommends that you continue on your current medications as directed. Please refer to the Current Medication list given to you today.  *If you need a refill on your cardiac medications before your next appointment, please call your pharmacy*  Lab Work: None ordered.  If you have labs (blood work) drawn today and your tests are completely normal, you will receive your results only by: MyChart Message (if you have MyChart) OR A paper copy in the mail If you have any lab test that is abnormal or we need to change your treatment, we will call you to review the results.  Testing/Procedures: Delane Fear- Long Term Monitor Instructions  Your physician has requested you wear a ZIO patch monitor for 14 days.  This is a single patch monitor. [employer] supplies one patch monitor per enrollment. Additional stickers are not available. Please do not apply patch if you will be having a Nuclear Stress Test,  Echocardiogram, Cardiac CT, MRI, or Chest Xray during the period you would be wearing the  monitor. The patch cannot be worn during these tests. You cannot remove and re-apply the  ZIO XT patch monitor.  Your ZIO patch monitor will be mailed 3 day USPS to your address on file. It may take 3-5 days  to receive your monitor after you have been enrolled.  Once you have received your monitor, please review the enclosed instructions. Your monitor  has already been registered assigning a specific monitor serial # to you.  Billing and Patient Assistance Program Information  We have supplied [employer] with any of your insurance information on file for billing purposes. [employer] offers a sliding scale Patient Assistance Program for patients that do not have  insurance, or whose insurance does not completely cover the cost of the ZIO monitor.  You must apply for the Patient Assistance Program to qualify for this discounted rate.  To apply, please call [employer] at  [phone], select option 4, select option 2, ask to apply for  Patient Assistance Program. Sanna Crystal will ask your household income, and how many people  are in your household. They will quote your out-of-pocket cost based on that information.  [employer] will also be able to set up a 8-month, interest-free payment plan if needed.  Applying the monitor   Shave hair from upper left chest.  Hold abrader disc by orange tab. Rub abrader in 40 strokes over the upper left chest as  indicated in your monitor instructions.  Clean area with 4 enclosed alcohol pads. Let dry.  Apply patch as indicated in monitor instructions. Patch will be placed under collarbone on left  side of chest with arrow pointing upward.  Rub patch adhesive wings for 2 minutes. Remove white label marked "1". Remove the white  label marked "2". Rub patch adhesive wings for 2 additional minutes.  While looking in a mirror, press and release button in center of patch. A small green light will  flash 3-4 times. This will be your only indicator that the monitor has been turned on.  Do not shower for the first 24 hours. You may shower after the first 24 hours.  Press the button if you feel a symptom. You will hear a small click. Record Date, Time and  Symptom in the Patient Logbook.  When you are ready to remove the patch, follow instructions on the last 2 pages of Patient  Logbook. Stick patch monitor onto the last page of Patient Logbook.  Place Patient Logbook in  the blue and white box. Use locking tab on box and tape box closed  securely. The blue and white box has prepaid postage on it. Please place it in the mailbox as  soon as possible. Your physician should have your test results approximately 7 days after the  monitor has been mailed back to [facility].  Call [facility] Customer Care at [phone] if you have questions regarding  your ZIO XT patch monitor. Call them immediately if you see an orange light  blinking on your  monitor.  If your monitor falls off in less than 4 days, contact our Monitor department at [phone].  If your monitor becomes loose or falls off after 4 days call [employer] at [phone] for  suggestions on securing your monitor   Follow-Up: At [facility], you and your health needs are our priority.  As part of our continuing mission to provide you with exceptional heart care, our providers are all part of one team.  This team includes your primary Cardiologist (physician) and Advanced Practice Providers or APPs (Physician Assistants and Nurse Practitioners) who all work together to provide you with the care you need, when you need it.  Your next appointment:   To be determined after monitor      1st Floor: - Lobby - Registration  - Pharmacy  - Lab - Cafe  2nd Floor: - PV Lab - Diagnostic Testing (echo, CT, nuclear med)  3rd Floor: - Vacant  4th Floor: - TCTS (cardiothoracic surgery) - AFib Clinic - Structural Heart Clinic - Vascular Surgery  - Vascular Ultrasound  5th Floor: - HeartCare Cardiology (general and EP) - Clinical Pharmacy for coumadin, hypertension, lipid, weight-loss medications, and med management appointments    Valet parking services will be available as well.

## 2023-10-20 ENCOUNTER — Other Ambulatory Visit: Payer: Self-pay | Admitting: Internal Medicine

## 2023-10-20 LAB — CUP PACEART INCLINIC DEVICE CHECK
Date Time Interrogation Session: 20250415082459
Implantable Lead Connection Status: 753985
Implantable Lead Implant Date: 20181102
Implantable Lead Location: 753860
Implantable Pulse Generator Implant Date: 20181102

## 2023-11-12 ENCOUNTER — Ambulatory Visit

## 2023-11-15 ENCOUNTER — Ambulatory Visit (INDEPENDENT_AMBULATORY_CARE_PROVIDER_SITE_OTHER): Payer: Medicare Other

## 2023-11-15 DIAGNOSIS — I428 Other cardiomyopathies: Secondary | ICD-10-CM

## 2023-11-15 DIAGNOSIS — I493 Ventricular premature depolarization: Secondary | ICD-10-CM | POA: Diagnosis not present

## 2023-11-15 DIAGNOSIS — I5022 Chronic systolic (congestive) heart failure: Secondary | ICD-10-CM

## 2023-11-16 ENCOUNTER — Ambulatory Visit: Attending: Cardiology

## 2023-11-16 DIAGNOSIS — Z95810 Presence of automatic (implantable) cardiac defibrillator: Secondary | ICD-10-CM | POA: Diagnosis not present

## 2023-11-16 DIAGNOSIS — I5022 Chronic systolic (congestive) heart failure: Secondary | ICD-10-CM

## 2023-11-16 LAB — CUP PACEART REMOTE DEVICE CHECK
Battery Remaining Longevity: 54 mo
Battery Voltage: 2.91 V
Brady Statistic RV Percent Paced: 0.64 %
Date Time Interrogation Session: 20250512001703
HighPow Impedance: 71 Ohm
Implantable Lead Connection Status: 753985
Implantable Lead Implant Date: 20181102
Implantable Lead Location: 753860
Implantable Pulse Generator Implant Date: 20181102
Lead Channel Impedance Value: 418 Ohm
Lead Channel Impedance Value: 513 Ohm
Lead Channel Pacing Threshold Amplitude: 0.625 V
Lead Channel Pacing Threshold Pulse Width: 0.4 ms
Lead Channel Sensing Intrinsic Amplitude: 4.5 mV
Lead Channel Sensing Intrinsic Amplitude: 4.5 mV
Lead Channel Setting Pacing Amplitude: 2 V
Lead Channel Setting Pacing Pulse Width: 0.4 ms
Lead Channel Setting Sensing Sensitivity: 0.3 mV
Zone Setting Status: 755011
Zone Setting Status: 755011

## 2023-11-17 ENCOUNTER — Ambulatory Visit: Payer: Self-pay | Admitting: Cardiology

## 2023-11-19 NOTE — Progress Notes (Signed)
 EPIC Encounter for ICM Monitoring  Patient Name: Christopher [name] is a 73 y.o. male Date: 11/19/2023 Primary Care Physican: Adelia Homestead, MD Primary Cardiologist: [facility] Electrophysiologist: Lawana Pray 09/25/2022 Weight: 234 lbs 12/04/2022 Weight: 233-235 lbs 05/18/2023 Weight: 229 lbs 07/02/2023 Weight: 230 lbs  09/08/2023 Weight: 226 lbs (has not been weighing daily) 09/21/2023 Office Weight: 225 lbs 10/19/2023 Office Weight: 227 lbs   Transmission results reviewed.      Optivol thoracic impedance suggesting possible fluid accumulation starting 4/29 but trending back toward baseline.    Prescribed:  Furosemide  40 mg take 1 tablet by mouth daily.     Spironolactone  25 mg take 1 tablet (25 mg total) daily   Labs: 08/18/2023 Creatinine 1.35, BUN 27, Potassium 3.8, Sodium 138  08/13/2023 Creatinine 1.40, BUN 18, Potassium 3.3, Sodium 139, GFR 53  08/12/2023 Creatinine 1.49, BUN 18, Potassium 3.3, Sodium 142, GFR 49  08/10/2023 Creatinine 1.55, BUN 19, Potassium 3.9, Sodium 136, GFR 47 A complete set of results can be found in Results Review.   Recommendations:  No changes.   Follow-up plan: ICM clinic phone appointment on 12/20/2023.   91 day device clinic remote transmission 02/14/2024.      EP/Cardiology Office Visits:    Recall 12/20/2023 with Dr Maximo Spar.     Copy of ICM check sent to Dr. Lawana Pray.  3 month ICM trend: 11/15/2023.    12-14 Month ICM trend:     Almyra Jain, RN 11/19/2023 5:10 PM

## 2023-11-23 ENCOUNTER — Ambulatory Visit (INDEPENDENT_AMBULATORY_CARE_PROVIDER_SITE_OTHER)

## 2023-11-23 VITALS — Ht 70.0 in | Wt 227.0 lb

## 2023-11-23 DIAGNOSIS — Z0000 Encounter for general adult medical examination without abnormal findings: Secondary | ICD-10-CM

## 2023-11-23 NOTE — Progress Notes (Signed)
 Subjective:   Christopher [name] is a 73 y.o. who presents for a Medicare Wellness preventive visit.  As a reminder, Annual Wellness Visits don't include a physical exam, and some assessments may be limited, especially if this visit is performed virtually. We may recommend an in-person follow-up visit with your provider if needed.  Visit Complete: Virtual I connected with  Christopher [name] on 11/23/23 by a audio enabled telemedicine application and verified that I am speaking with the correct person using two identifiers.  Patient Location: Home  Provider Location: Home Office  I discussed the limitations of evaluation and management by telemedicine. The patient expressed understanding and agreed to proceed.  Vital Signs: Because this visit was a virtual/telehealth visit, some criteria may be missing or patient reported. Any vitals not documented were not able to be obtained and vitals that have been documented are patient reported.  VideoDeclined- This patient declined Librarian, academic. Therefore the visit was completed with audio only.  Persons Participating in Visit: Patient.  AWV Questionnaire: Yes: Patient Medicare AWV questionnaire was completed by the patient on 11/20/2023; I have confirmed that all information answered by patient is correct and no changes since this date.        Objective:     Today's Vitals   11/23/23 1309  Weight: 227 lb (103 kg)  Height: 5\' 10"  (1.778 m)   Body mass index is 32.57 kg/m.     11/23/2023    1:19 PM 08/10/2023    8:59 PM 02/15/2021    2:10 AM 02/14/2021    6:36 PM 09/05/2020   12:40 PM 05/24/2019    3:33 PM 05/07/2017   10:00 PM  Advanced Directives  Does Patient Have a Medical Advance Directive? No No  No No No No  Would patient like information on creating a medical advance directive?   No - Patient declined  No - Patient declined Yes (ED - Information included in AVS) No - Patient declined    Current  Medications (verified) Outpatient Encounter Medications as of 11/23/2023  Medication Sig   ACCU-CHEK GUIDE test strip USE AS INSTRUCTED   Accu-Chek Softclix Lancets lancets USE AS INSTRUCTED   acetaminophen  (TYLENOL ) 500 MG tablet Take 500 mg by mouth every 6 (six) hours as needed for mild pain (pain score 1-3) or headache.   atorvastatin  (LIPITOR) 40 MG tablet Take 1 tablet (40 mg total) by mouth daily.   blood glucose meter kit and supplies KIT Dispense based on patient and insurance preference. Use up to four times daily as directed.   cetirizine (ZYRTEC) 10 MG tablet Take 10 mg by mouth daily.   cholecalciferol  (VITAMIN D ) 25 MCG (1000 UNIT) tablet Take 1 tablet by mouth daily.    clopidogrel  (PLAVIX ) 75 MG tablet TAKE 1 TABLET BY MOUTH DAILY   FARXIGA  10 MG TABS tablet TAKE 1 TABLET BY MOUTH EVERY DAY BEFORE BREAKFAST   fenofibrate  (TRICOR ) 145 MG tablet Take 1 tablet (145 mg total) by mouth daily.   fluticasone  (FLONASE ) 50 MCG/ACT nasal spray USE 2 SPRAYS IN BOTH NOSTRILS  DAILY   furosemide  (LASIX ) 40 MG tablet Take 1 tablet (40 mg total) by mouth daily.   Insulin  Lispro Prot & Lispro (HUMALOG  MIX 75/25 KWIKPEN) (75-25) 100 UNIT/ML Kwikpen Inject 18 Units into the skin in the morning and at bedtime.   Insulin  Pen Needle (B-D ULTRAFINE III SHORT PEN) 31G X 8 MM MISC USE TWICE DAILY FOR INSULIN   INJECTION  metFORMIN  (GLUCOPHAGE ) 1000 MG tablet TAKE 1 TABLET BY MOUTH TWICE  DAILY WITH A MEAL   metoprolol  succinate (TOPROL -XL) 100 MG 24 hr tablet Take 1 tablet (100 mg total) by mouth daily.   OVER THE COUNTER MEDICATION 1 tablet in the morning and at bedtime. [facility] - Restore multivitamin   sacubitril -valsartan  (ENTRESTO ) 97-103 MG Take 1 tablet by mouth 2 (two) times daily.   spironolactone  (ALDACTONE ) 25 MG tablet TAKE 1 TABLET BY MOUTH DAILY   tamsulosin  (FLOMAX ) 0.4 MG CAPS capsule Take 1 capsule (0.4 mg total) by mouth daily.   [DISCONTINUED] cetirizine (ZYRTEC) 10 MG chewable  tablet Chew 10 mg by mouth daily.   No facility-administered encounter medications on file as of 11/23/2023.    Allergies (verified) Codeine, Dust mite extract, and Pollen extract   History: Past Medical History:  Diagnosis Date   Arthritis    Atrial fibrillation (HCC)    post op, intol of anticoag   Cardiac arrest (HCC)    a. s/p MDT single chamber ICD; 11-10-18- pt denies having a heart attack   Cataract    CHF (congestive heart failure) (HCC)    Chronic kidney disease    kidney stone   Coronary artery disease    a. 2/7 Cath: LM nl, LAD min irregs, LCX min irregs, RI 40, RCA 55m, EF 55-60% basal to mid inf HK, 3-4+ MR;  b. 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 Mitral valve prolapse with flail segment of posterior leaflet and severe MR by TEE, remote h/o bacterial endocarditis    Sleep apnea    NPSG 01/21/06- AHI 40.7/hr cpap   Stroke [facility])    summer 2022- one messed up vision, the other balance   Subacute bacterial endocarditis 03/22/2008   Strep viridans   Ventricular fibrillation (HCC) 11/07/2019   appropriate shock (36J) for VF delivered   Past Surgical History:  Procedure Laterality Date   AMPUTATION  07/28/2012   Procedure: AMPUTATION DIGIT;  Surgeon: Janifer Meigs, MD;  Location: [facility] ORS;  Service: Orthopedics;  Laterality: Right;  2nd toe   CARDIAC CATHETERIZATION     COLONOSCOPY     EP IMPLANTABLE DEVICE N/A 05/01/2016   Procedure: Loop Recorder Removal;  Surgeon:  Jolly Needle, MD;  Location: [facility] INVASIVE CV LAB;  Service: Cardiovascular;  Laterality: N/A;   FOOT SURGERY     BILATERAL TOES   ICD IMPLANT N/A 05/07/2017    Medtronic Visia AF MRI VR SureScan implanted by Dr Lawana Pray following VF arrest   Implantable loop recorder placement  03/31/2013   MDT Linq implanted by Dr Nunzio Belch to evaluate for further afib   INTRAOPERATIVE TRANSESOPHAGEAL ECHOCARDIOGRAM N/A 09/27/2012   Procedure: INTRAOPERATIVE TRANSESOPHAGEAL ECHOCARDIOGRAM;  Surgeon: Gardenia Jump, MD;  Location: [facility] OR;  Service: Open Heart Surgery;  Laterality: N/A;   LEFT AND RIGHT HEART CATHETERIZATION WITH CORONARY ANGIOGRAM N/A 08/12/2012   Procedure: LEFT AND RIGHT HEART CATHETERIZATION WITH CORONARY  ANGIOGRAM;  Surgeon: Darlis Eisenmenger, MD;  Location: [facility] CATH LAB;  Service: Cardiovascular;  Laterality: N/A;   LEFT HEART CATH AND CORONARY ANGIOGRAPHY N/A 05/06/2017   Procedure: LEFT HEART CATH AND CORONARY ANGIOGRAPHY;  Surgeon: Arleen Lacer, MD;  Location: [facility] INVASIVE CV LAB;  Service: Cardiovascular;  Laterality: N/A;   LEFT HEART CATH AND CORONARY ANGIOGRAPHY N/A 08/12/2023   Procedure: LEFT HEART CATH AND CORONARY ANGIOGRAPHY;  Surgeon: Swazil[place], Peter M, MD;  Location: [facility] INVASIVE CV LAB;  Service: Cardiovascular;  Laterality: N/A;   LOOP RECORDER IMPLANT N/A 03/31/2013   Procedure: LOOP RECORDER IMPLANT;  Surgeon: Ellaree Gunther, MD;  Location: [facility] CATH LAB;  Service: Cardiovascular;  Laterality: N/A;   MITRAL VALVE REPAIR Right 09/27/2012   Procedure: MINIMALLY INVASIVE MITRAL VALVE REPAIR (MVR);  Surgeon: Gardenia Jump, MD;  Location: [facility] OR;  Service: Open Heart Surgery;  Laterality: Right;  Ultrasound guided   PERCUTANEOUS CORONARY STENT INTERVENTION (PCI-S) N/A 08/25/2012   Procedure: PERCUTANEOUS CORONARY STENT INTERVENTION (PCI-S);  Surgeon: Arnoldo Lapping, MD;  Location: [facility] CATH LAB;  Service: Cardiovascular;  Laterality: N/A;   POLYPECTOMY     SEPTOPLASTY     Dr. Franklin Ito    TEE WITHOUT CARDIOVERSION N/A 08/12/2012   Procedure: TRANSESOPHAGEAL ECHOCARDIOGRAM (TEE);  Surgeon: Darlis Eisenmenger, MD;  Location: [facility] ENDOSCOPY;  Service: Cardiovascular;  Laterality: N/A;   TONSILLECTOMY     UVULOPALATOPHARYNGOPLASTY     Family History  Problem Relation Age of Onset   Diabetes Mother    Stroke Mother    Heart disease Father    Colon cancer Father 79   Diabetes Father    Renal cancer Father    Cancer Father    Sudden death Brother    Heart attack Brother    Esophageal cancer Neg Hx    Rectal cancer Neg Hx    Stomach cancer Neg Hx    Colon polyps Neg Hx    Social History   Socioeconomic History   Marital status: Divorced    Spouse name: Not on file   Number of children: 0   Years of education: Not on file   Highest education level: Some college, no degree  Occupational History   Occupation: Works at [employer] Retired  Tobacco Use   Smoking status: Former    Current packs/day: 0.00    Average packs/day: 2.5 packs/day for 5.0 years (12.5 ttl pk-yrs)    Types: Cigarettes    Start date: 07/06/1973    Quit date: 07/06/1978    Years since quitting: 45.4   Smokeless tobacco: Never   Tobacco comments:    social drinker  Vaping Use   Vaping status: Never Used  Substance and Sexual Activity   Alcohol use: Yes    Comment: OCC.   Drug use: No   Sexual activity: Yes  Other Topics Concern   Not on file  Social History Narrative   Works at [employer] - plans to retire 07/2015 after 36 years.   Divorced, lives alone/2025   Supportive g-friend (shirlee moore)         Social Drivers of Health   Financial Resource Strain: Low Risk  (11/20/2023)   Overall Financial Resource Strain (CARDIA)    Difficulty of Paying Living Expenses: Not hard at all  Food Insecurity: No Food Insecurity (11/20/2023)   Hunger Vital Sign    Worried About Running Out of Food in the Last Year: Never true    Ran Out of Food in the  Last Year: Never true  Transportation Needs: No  Transportation Needs (11/20/2023)   PRAPARE - Administrator, Civil Service (Medical): No    Lack of Transportation (Non-Medical): No  Physical Activity: Unknown (11/20/2023)   Exercise Vital Sign    Days of Exercise per Week: 0 days    Minutes of Exercise per Session: Not on file  Stress: No Stress Concern Present (11/20/2023)   [employer] of Occupational Health - Occupational Stress Questionnaire    Feeling of Stress : Only a little  Social Connections: Unknown (11/20/2023)   Social Connection and Isolation Panel [NHANES]    Frequency of Communication with Friends and Family: More than three times a week    Frequency of Social Gatherings with Friends and Family: Once a week    Attends Religious Services: Patient declined    Database administrator or Organizations: No    Attends Engineer, structural: Never    Marital Status: Divorced    Tobacco Counseling Counseling given: Not Answered Tobacco comments: social drinker    Clinical Intake:  Pre-visit preparation completed: Yes  Pain : No/denies pain     BMI - recorded: 32.57 Nutritional Status: BMI > 30  Obese Nutritional Risks: None Diabetes: Yes CBG done?: Yes (95 fasting) CBG resulted in Enter/ Edit results?: No Did pt. bring in CBG monitor from home?: No  Lab Results  Component Value Date   HGBA1C 5.4 05/31/2023   HGBA1C 5.8 11/20/2022   HGBA1C 5.7 11/18/2021     How often do you need to have someone help you when you read instructions, pamphlets, or other written materials from your doctor or pharmacy?: 1 - Never  Interpreter Needed?: No  Information entered by :: Emaly Boschert, RMA   Activities of Daily Living     11/20/2023    9:44 AM 08/12/2023   12:57 PM  In your present state of health, do you have any difficulty performing the following activities:  Hearing? 0 1  Vision? 0 0  Difficulty concentrating or making decisions? 0 1  Walking or climbing stairs? 1   Dressing or  bathing? 0   Doing errands, shopping? 0 0  Preparing Food and eating ? N   In the past six months, have you accidently leaked urine? Y   Do you have problems with loss of bowel control? N   Managing your Medications? N   Managing your Finances? N   Housekeeping or managing your Housekeeping? N     Patient Care Team: Adelia Homestead, MD as PCP - General (Internal Medicine) Maximo Spar Aviva Lemmings, MD as PCP - Cardiology (Cardiology) Lei Pump, MD as PCP - Electrophysiology (Cardiology) Darlis Eisenmenger, MD as Attending Physician (Cardiology) Gardenia Jump, MD (Inactive) as Attending Physician (Cardiothoracic Surgery) Faustina Hood, MD as Attending Physician (Pulmonary Disease) Arnoldo Lapping, MD as Attending Physician (Cardiology) Tami Falcon, MD as Consulting Physician (Gastroenterology) Jonathan Neighbor, [facility] (Inactive) as Pharmacist (Pharmacist) Nelva Bang, OD as Referring Physician (Optometry)  Indicate any recent Medical Services you may have received from other than [facility] providers in the past year (date may be approximate).     Assessment:    This is a routine wellness examination for Christopher [name].  Hearing/Vision screen Hearing Screening - Comments:: Some hearing loss per pt Vision Screening - Comments:: Wears eyeglasses for reading/Dr. Geralyn Knee   Goals Addressed             This Visit's Progress  Patient Stated       Not at this time.       Depression Screen     11/23/2023    1:21 PM 08/18/2023    9:38 AM 11/20/2022    1:38 PM 05/22/2022    1:43 PM 11/18/2021    1:06 PM 04/09/2021    2:34 PM 09/05/2020   12:53 PM  PHQ 2/9 Scores  PHQ - 2 Score 0 0 0 0 0 0 0  PHQ- 9 Score 0  0 0 3      Fall Risk     11/20/2023    9:44 AM 05/31/2023    8:23 AM 11/20/2022    1:38 PM 05/22/2022    1:43 PM 11/18/2021    1:06 PM  Fall Risk   Falls in the past year? 1 1 0 0 0  Comment Oct/2024      Number falls in past yr: 0 0 0 0 0  Injury with Fall? 0 1 0 0  0  Risk for fall due to : Medication side effect      Follow up Falls evaluation completed;Falls prevention discussed Falls evaluation completed Falls evaluation completed Falls evaluation completed     MEDICARE RISK AT HOME:  Medicare Risk at Home Any stairs in or around the home?: (Patient-Rptd) Yes If so, are there any without handrails?: (Patient-Rptd) Yes Home free of loose throw rugs in walkways, pet beds, electrical cords, etc?: (Patient-Rptd) Yes Adequate lighting in your home to reduce risk of falls?: (Patient-Rptd) Yes Life alert?: (Patient-Rptd) No Use of a cane, walker or w/c?: (Patient-Rptd) No Grab bars in the bathroom?: (Patient-Rptd) Yes Shower chair or bench in shower?: (Patient-Rptd) Yes Elevated toilet seat or a handicapped toilet?: (Patient-Rptd) Yes  TIMED UP AND GO:  Was the test performed?  No  Cognitive Function: Declined/Normal: No cognitive concerns noted by patient or family. Patient alert, oriented, able to answer questions appropriately and recall recent events. No signs of memory loss or confusion.        Immunizations Immunization History  Administered Date(s) Administered   Fluad Quad(high Dose 65+) 03/04/2019, 05/16/2020, 05/20/2021   Influenza, High Dose Seasonal PF 05/03/2015, 04/15/2017, 04/22/2018, 05/04/2022   Influenza,inj,Quad PF,6+ Mos 04/09/2014, 05/11/2016   Influenza-Unspecified 04/26/2013, 05/09/2015, 04/28/2023   PFIZER(Purple Top)SARS-COV-2 Vaccination 08/18/2019, 09/10/2019, 05/18/2020, 01/24/2021   Pfizer Covid-19 Vaccine Bivalent Booster 78yrs & up 05/04/2022   Pneumococcal Conjugate-13 11/13/2014   Pneumococcal Polysaccharide-23 05/11/2016   Pneumococcal-Unspecified 02/24/2010   Tdap 08/20/2010   Zoster Recombinant(Shingrix) 04/26/2017, 07/01/2017   Zoster, Live 08/06/2009    Screening Tests Health Maintenance  Topic Date Due   DTaP/Tdap/Td (2 - Td or Tdap) 08/20/2020   COVID-19 Vaccine (6 - 2024-25 season) 03/07/2023    Diabetic kidney evaluation - Urine ACR  11/20/2023   Medicare Annual Wellness (AWV)  11/20/2023   HEMOGLOBIN A1C  11/28/2023   INFLUENZA VACCINE  02/04/2024   OPHTHALMOLOGY EXAM  04/25/2024   Diabetic kidney evaluation - eGFR measurement  08/17/2024   FOOT EXAM  09/21/2024   Colonoscopy  02/04/2025   Pneumonia Vaccine 20+ Years old  Completed   Hepatitis C Screening  Completed   Zoster Vaccines- Shingrix  Completed   HPV VACCINES  Aged Out   Meningococcal B Vaccine  Aged Out    Health Maintenance  Health Maintenance Due  Topic Date Due   DTaP/Tdap/Td (2 - Td or Tdap) 08/20/2020   COVID-19 Vaccine (6 - 2024-25 season) 03/07/2023   Diabetic kidney evaluation -  Urine ACR  11/20/2023   Medicare Annual Wellness (AWV)  11/20/2023   Health Maintenance Items Addressed: See Nurse Notes  Additional Screening:  Vision Screening: Recommended annual ophthalmology exams for early detection of glaucoma and other disorders of the eye.  Dental Screening: Recommended annual dental exams for proper oral hygiene  Community Resource Referral / Chronic Care Management: CRR required this visit?  No   CCM required this visit?  No   Plan:    I have personally reviewed and noted the following in the patient's chart:   Medical and social history Use of alcohol, tobacco or illicit drugs  Current medications and supplements including opioid prescriptions. Patient is not currently taking opioid prescriptions. Functional ability and status Nutritional status Physical activity Advanced directives List of other physicians Hospitalizations, surgeries, and ER visits in previous 12 months Vitals Screenings to include cognitive, depression, and falls Referrals and appointments  In addition, I have reviewed and discussed with patient certain preventive protocols, quality metrics, and best practice recommendations. A written personalized care plan for preventive services as well as general  preventive health recommendations were provided to patient.   Prayan Ulin L Edithe Dobbin, CMA   11/23/2023   After Visit Summary: (MyChart) Due to this being a telephonic visit, the after visit summary with patients personalized plan was offered to patient via MyChart   Notes: Please refer to Routing Comments.

## 2023-11-23 NOTE — Patient Instructions (Signed)
 Mr. Tallman , Thank you for taking time out of your busy schedule to complete your Annual Wellness Visit with me. I enjoyed our conversation and look forward to speaking with you again next year. I, as well as your care team,  appreciate your ongoing commitment to your health goals. Please review the following plan we discussed and let me know if I can assist you in the future. Your Game plan/ To Do List     Follow up Visits: Next Medicare AWV with our clinical staff: 11/23/2024.   Have you seen your provider in the last 6 months (3 months if uncontrolled diabetes)? Yes Next Office Visit with your provider: 11/20/2022.    Clinician Recommendations:  Aim for 30 minutes of exercise or brisk walking, 6-8 glasses of water, and 5 servings of fruits and vegetables each day.       This is a list of the screening recommended for you and due dates:  Health Maintenance  Topic Date Due   DTaP/Tdap/Td vaccine (2 - Td or Tdap) 08/20/2020   COVID-19 Vaccine (6 - 2024-25 season) 03/07/2023   Yearly kidney health urinalysis for diabetes  11/20/2023   Medicare Annual Wellness Visit  11/20/2023   Hemoglobin A1C  11/28/2023   Flu Shot  02/04/2024   Eye exam for diabetics  04/25/2024   Yearly kidney function blood test for diabetes  08/17/2024   Complete foot exam   09/21/2024   Colon Cancer Screening  02/04/2025   Pneumonia Vaccine  Completed   Hepatitis C Screening  Completed   Zoster (Shingles) Vaccine  Completed   HPV Vaccine  Aged Out   Meningitis B Vaccine  Aged Out    Advanced directives: (Declined) Advance directive discussed with you today. Even though you declined this today, please call our office should you change your mind, and we can give you the proper paperwork for you to fill out. Advance Care Planning is important because it:  [x]  Makes sure you receive the medical care that is consistent with your values, goals, and preferences  [x]  It provides guidance to your family and loved ones  and reduces their decisional burden about whether or not they are making the right decisions based on your wishes.  Follow the link provided in your after visit summary or read over the paperwork we have mailed to you to help you started getting your Advance Directives in place. If you need assistance in completing these, please reach out to us  so that we can help you!  See attachments for Preventive Care and Fall Prevention Tips.

## 2023-11-24 DIAGNOSIS — M25552 Pain in left hip: Secondary | ICD-10-CM | POA: Diagnosis not present

## 2023-11-24 DIAGNOSIS — M25562 Pain in left knee: Secondary | ICD-10-CM | POA: Diagnosis not present

## 2023-11-25 ENCOUNTER — Ambulatory Visit: Payer: Self-pay | Admitting: *Deleted

## 2023-11-25 MED ORDER — MEXILETINE HCL 250 MG PO CAPS: 250.0000 mg | ORAL_CAPSULE | Freq: Two times a day (BID) | ORAL | 3 refills | Status: DC

## 2023-11-30 ENCOUNTER — Ambulatory Visit (INDEPENDENT_AMBULATORY_CARE_PROVIDER_SITE_OTHER): Payer: Medicare Other | Admitting: Internal Medicine

## 2023-11-30 ENCOUNTER — Encounter: Payer: Self-pay | Admitting: Internal Medicine

## 2023-11-30 VITALS — BP 102/74 | HR 69 | Temp 97.6°F | Ht 70.0 in | Wt 236.4 lb

## 2023-11-30 DIAGNOSIS — E1169 Type 2 diabetes mellitus with other specified complication: Secondary | ICD-10-CM | POA: Diagnosis not present

## 2023-11-30 DIAGNOSIS — I10 Essential (primary) hypertension: Secondary | ICD-10-CM | POA: Diagnosis not present

## 2023-11-30 DIAGNOSIS — E118 Type 2 diabetes mellitus with unspecified complications: Secondary | ICD-10-CM

## 2023-11-30 DIAGNOSIS — Z0000 Encounter for general adult medical examination without abnormal findings: Secondary | ICD-10-CM | POA: Diagnosis not present

## 2023-11-30 DIAGNOSIS — I428 Other cardiomyopathies: Secondary | ICD-10-CM

## 2023-11-30 DIAGNOSIS — E785 Hyperlipidemia, unspecified: Secondary | ICD-10-CM

## 2023-11-30 LAB — COMPREHENSIVE METABOLIC PANEL WITH GFR
ALT: 16 U/L (ref 0–53)
AST: 22 U/L (ref 0–37)
Albumin: 4.5 g/dL (ref 3.5–5.2)
Alkaline Phosphatase: 39 U/L (ref 39–117)
BUN: 36 mg/dL — ABNORMAL HIGH (ref 6–23)
CO2: 30 meq/L (ref 19–32)
Calcium: 10.1 mg/dL (ref 8.4–10.5)
Chloride: 98 meq/L (ref 96–112)
Creatinine, Ser: 2.16 mg/dL — ABNORMAL HIGH (ref 0.40–1.50)
GFR: 29.47 mL/min — ABNORMAL LOW (ref >60.00)
Glucose, Bld: 109 mg/dL — ABNORMAL HIGH (ref 70–99)
Potassium: 3.9 meq/L (ref 3.5–5.1)
Sodium: 139 meq/L (ref 135–145)
Total Bilirubin: 0.7 mg/dL (ref 0.2–1.2)
Total Protein: 7.3 g/dL (ref 6.0–8.3)

## 2023-11-30 LAB — LIPID PANEL
Cholesterol: 163 mg/dL (ref 0–200)
HDL: 42.9 mg/dL (ref >39.00)
LDL Cholesterol: 66 mg/dL (ref 0–99)
NonHDL: 120.25
Total CHOL/HDL Ratio: 4
Triglycerides: 273 mg/dL — ABNORMAL HIGH (ref 0.0–149.0)
VLDL: 54.6 mg/dL — ABNORMAL HIGH (ref 0.0–40.0)

## 2023-11-30 LAB — CBC
HCT: 41.8 % (ref 39.0–52.0)
Hemoglobin: 14 g/dL (ref 13.0–17.0)
MCHC: 33.6 g/dL (ref 30.0–36.0)
MCV: 90 fl (ref 78.0–100.0)
Platelets: 193 10*3/uL (ref 150.0–400.0)
RBC: 4.64 Mil/uL (ref 4.22–5.81)
RDW: 16 % — ABNORMAL HIGH (ref 11.5–15.5)
WBC: 6.2 10*3/uL (ref 4.0–10.5)

## 2023-11-30 LAB — HEMOGLOBIN A1C: Hgb A1c MFr Bld: 5.9 % (ref 4.6–6.5)

## 2023-11-30 LAB — MICROALBUMIN / CREATININE URINE RATIO
Creatinine,U: 75.8 mg/dL
Microalb Creat Ratio: 16.1 mg/g (ref 0.0–30.0)
Microalb, Ur: 1.2 mg/dL (ref 0.0–1.9)

## 2023-11-30 MED ORDER — ATORVASTATIN CALCIUM 40 MG PO TABS: 40.0000 mg | ORAL_TABLET | Freq: Every day | ORAL | 3 refills | Status: AC

## 2023-11-30 MED ORDER — FENOFIBRATE 145 MG PO TABS: 145.0000 mg | ORAL_TABLET | Freq: Every day | ORAL | 3 refills | Status: DC

## 2023-11-30 MED ORDER — INSULIN LISPRO PROT & LISPRO (75-25 MIX) 100 UNIT/ML KWIKPEN: 18.0000 [IU] | PEN_INJECTOR | Freq: Two times a day (BID) | SUBCUTANEOUS | 3 refills | Status: AC

## 2023-11-30 MED ORDER — METOPROLOL SUCCINATE ER 100 MG PO TB24: 100.0000 mg | ORAL_TABLET | Freq: Every day | ORAL | 3 refills | Status: AC

## 2023-11-30 MED ORDER — TAMSULOSIN HCL 0.4 MG PO CAPS: 0.4000 mg | ORAL_CAPSULE | Freq: Every day | ORAL | 3 refills | Status: AC

## 2023-11-30 MED ORDER — FLUTICASONE PROPIONATE 50 MCG/ACT NA SUSP: 2.0000 | Freq: Every day | NASAL | 3 refills | Status: AC

## 2023-11-30 MED ORDER — METFORMIN HCL 1000 MG PO TABS: 1000.0000 mg | ORAL_TABLET | Freq: Two times a day (BID) | ORAL | 3 refills | Status: DC

## 2023-11-30 MED ORDER — DAPAGLIFLOZIN PROPANEDIOL 10 MG PO TABS: 10.0000 mg | ORAL_TABLET | Freq: Every day | ORAL | 3 refills | Status: DC

## 2023-11-30 MED ORDER — ACCU-CHEK SOFTCLIX LANCETS MISC
1.0000 | 12 refills | Status: AC
Start: 2023-11-30 — End: ?

## 2023-11-30 MED ORDER — ACCU-CHEK GUIDE TEST VI STRP: ORAL_STRIP | 12 refills | Status: AC

## 2023-11-30 MED ORDER — CLOPIDOGREL BISULFATE 75 MG PO TABS: 75.0000 mg | ORAL_TABLET | Freq: Every day | ORAL | 3 refills | Status: DC

## 2023-11-30 NOTE — Progress Notes (Signed)
   Subjective:   Patient ID: Christopher [name], male    DOB: 1951/05/19, 73 y.o.   MRN: [id number]  HPI The patient is here for physical.  PMH, [facility], social history reviewed and updated  Review of Systems  Constitutional:  Positive for activity change.  HENT: Negative.    Eyes: Negative.   Respiratory:  Positive for shortness of breath. Negative for cough and chest tightness.   Cardiovascular:  Negative for chest pain, palpitations and leg swelling.  Gastrointestinal:  Negative for abdominal distention, abdominal pain, constipation, diarrhea, nausea and vomiting.  Musculoskeletal: Negative.   Skin: Negative.   Neurological: Negative.   Psychiatric/Behavioral: Negative.      Objective:  Physical Exam Constitutional:      Appearance: He is well-developed.  HENT:     Head: Normocephalic and atraumatic.  Cardiovascular:     Rate and Rhythm: Normal rate and regular rhythm.  Pulmonary:     Effort: Pulmonary effort is normal. No respiratory distress.     Breath sounds: Normal breath sounds. No wheezing or rales.  Abdominal:     General: Bowel sounds are normal. There is no distension.     Palpations: Abdomen is soft.     Tenderness: There is no abdominal tenderness. There is no rebound.  Musculoskeletal:     Cervical back: Normal range of motion.  Skin:    General: Skin is warm and dry.  Neurological:     Mental Status: He is alert and oriented to person, place, and time.     Coordination: Coordination normal.     Vitals:   11/30/23 1301  BP: 102/74  Pulse: 69  Temp: 97.6 F (36.4 C)  TempSrc: Oral  SpO2: 99%  Weight: 236 lb 6.4 oz (107.2 kg)  Height: 5\' 10"  (1.778 m)    Assessment & Plan:

## 2023-11-30 NOTE — Assessment & Plan Note (Signed)
Flu shot yearly. Pneumonia complete. Shingrix complete. Tetanus due at pharmacy. Colonoscopy due 2026. Counseled about sun safety and mole surveillance. Counseled about the dangers of distracted driving. Given 10 year screening recommendations.

## 2023-11-30 NOTE — Assessment & Plan Note (Signed)
 Stable on optimal regimen through cardiology. No anginal symptoms. Some SOB on exertion.

## 2023-11-30 NOTE — Assessment & Plan Note (Signed)
 Checking HgA1c, microalbumin to creatinine ratio, CMP, lipid panel. Adjust as needed. Taking metformin , farxiga , insulin  75/25 18 units BID. Monitoring sugars refilled supplies. On statin and ARB.

## 2023-11-30 NOTE — Assessment & Plan Note (Signed)
 BP at goal on spironolactone  and metoprolol  and entresto  and no lightheadedness. Checking CMP and adjust as needed.

## 2023-11-30 NOTE — Assessment & Plan Note (Signed)
 Checking lipid panel and adjust as needed lipitor.

## 2023-12-01 ENCOUNTER — Ambulatory Visit: Payer: Self-pay | Admitting: Internal Medicine

## 2023-12-01 DIAGNOSIS — I428 Other cardiomyopathies: Secondary | ICD-10-CM

## 2023-12-06 ENCOUNTER — Other Ambulatory Visit (HOSPITAL_COMMUNITY): Payer: Self-pay

## 2023-12-07 ENCOUNTER — Other Ambulatory Visit: Payer: Self-pay

## 2023-12-07 MED ORDER — ENTRESTO 97-103 MG PO TABS: 1.0000 | ORAL_TABLET | Freq: Two times a day (BID) | ORAL | 3 refills | Status: DC

## 2023-12-15 DIAGNOSIS — M25552 Pain in left hip: Secondary | ICD-10-CM | POA: Diagnosis not present

## 2023-12-15 DIAGNOSIS — G8929 Other chronic pain: Secondary | ICD-10-CM | POA: Diagnosis not present

## 2023-12-20 ENCOUNTER — Ambulatory Visit: Attending: Cardiology

## 2023-12-20 DIAGNOSIS — Z95810 Presence of automatic (implantable) cardiac defibrillator: Secondary | ICD-10-CM

## 2023-12-20 DIAGNOSIS — I5022 Chronic systolic (congestive) heart failure: Secondary | ICD-10-CM | POA: Diagnosis not present

## 2023-12-22 DIAGNOSIS — M25552 Pain in left hip: Secondary | ICD-10-CM | POA: Diagnosis not present

## 2023-12-22 DIAGNOSIS — M25551 Pain in right hip: Secondary | ICD-10-CM | POA: Diagnosis not present

## 2023-12-24 NOTE — Progress Notes (Signed)
 EPIC Encounter for ICM Monitoring  Patient Name: Christopher [name] is a 73 y.o. male Date: 12/24/2023 Primary Care Physican: Adelia Homestead, MD Primary Cardiologist: [facility] Electrophysiologist: Lawana Pray 09/08/2023 Weight: 226 lbs (has not been weighing daily) 09/21/2023 Office Weight: 225 lbs 10/19/2023 Office Weight: 227 lbs 12/24/2023 Weight: 227 lbs   Spoke with patient and heart failure questions reviewed.  Transmission results reviewed.  Pt asymptomatic for fluid accumulation.  Reports feeling well at this time and voices no complaints.        Optivol thoracic impedance suggesting normal fluid levels with the exception of possible fluid accumulation from 5/13-5/20.   Prescribed:  Furosemide  40 mg take 1 tablet by mouth daily.     Spironolactone  25 mg take 1 tablet (25 mg total) daily   Labs: 08/18/2023 Creatinine 1.35, BUN 27, Potassium 3.8, Sodium 138  08/13/2023 Creatinine 1.40, BUN 18, Potassium 3.3, Sodium 139, GFR 53  08/12/2023 Creatinine 1.49, BUN 18, Potassium 3.3, Sodium 142, GFR 49  08/10/2023 Creatinine 1.55, BUN 19, Potassium 3.9, Sodium 136, GFR 47 A complete set of results can be found in Results Review.   Recommendations:  No changes and encouraged to call if experiencing any fluid symptoms.  Encouraged to call to schedule appt with Dr Maximo Spar.   Follow-up plan: ICM clinic phone appointment on 02/07/2024.   91 day device clinic remote transmission 02/14/2024.      EP/Cardiology Office Visits:    Recall 12/20/2023 with Dr Maximo Spar.  Recall 06/11/2024 with Dr Lawana Pray.   Copy of ICM check sent to Dr. Lawana Pray.  3 month ICM trend: 12/20/2023.    12-14 Month ICM trend:     Almyra Jain, RN 12/24/2023 2:47 PM

## 2023-12-29 DIAGNOSIS — M25551 Pain in right hip: Secondary | ICD-10-CM | POA: Diagnosis not present

## 2023-12-29 DIAGNOSIS — M25552 Pain in left hip: Secondary | ICD-10-CM | POA: Diagnosis not present

## 2024-01-03 NOTE — Progress Notes (Signed)
 Remote ICD transmission.

## 2024-01-03 NOTE — Addendum Note (Signed)
 Addended by: TAWNI DRILLING D on: 01/03/2024 12:25 PM   Modules accepted: Orders

## 2024-01-05 DIAGNOSIS — M25551 Pain in right hip: Secondary | ICD-10-CM | POA: Diagnosis not present

## 2024-01-10 DIAGNOSIS — M25552 Pain in left hip: Secondary | ICD-10-CM | POA: Diagnosis not present

## 2024-01-12 ENCOUNTER — Ambulatory Visit (INDEPENDENT_AMBULATORY_CARE_PROVIDER_SITE_OTHER): Admitting: Podiatry

## 2024-01-12 ENCOUNTER — Encounter: Payer: Self-pay | Admitting: Podiatry

## 2024-01-12 VITALS — Ht 70.0 in | Wt 236.0 lb

## 2024-01-12 DIAGNOSIS — B351 Tinea unguium: Secondary | ICD-10-CM | POA: Diagnosis not present

## 2024-01-12 DIAGNOSIS — E118 Type 2 diabetes mellitus with unspecified complications: Secondary | ICD-10-CM

## 2024-01-12 DIAGNOSIS — M79674 Pain in right toe(s): Secondary | ICD-10-CM

## 2024-01-12 DIAGNOSIS — Z89421 Acquired absence of other right toe(s): Secondary | ICD-10-CM

## 2024-01-12 DIAGNOSIS — M79675 Pain in left toe(s): Secondary | ICD-10-CM

## 2024-01-16 NOTE — Progress Notes (Signed)
Subjective:  Patient ID: Christopher [name], male    DOB: 1950/12/26,  MRN: [id number]  Christopher [name] presents to clinic today for at risk foot care. Patient has h/o NIDDM with amputation of digital amputation R 2nd toe and painful thick toenails that are difficult to trim. Pain interferes with ambulation. Aggravating factors include wearing enclosed shoe gear. Pain is relieved with periodic professional debridement.  Chief Complaint  Patient presents with   Diabetes    Patient is here for routine [facility]: Pain due to onychomycosis of toenails of both feet last A1C: 5.9; Last PCP visit :11/28/2023   New problem(s): None.   PCP is [name], MD.  Allergies  Allergen Reactions   Codeine Other (See Comments)    Causes bad constipation   Dust Mite Extract Cough   Pollen Extract Cough    Review of Systems: Negative except as noted in the HPI.  Objective: No changes noted in today's physical examination. There were no vitals filed for this visit. Christopher [name] is a pleasant 73 y.o. male obese in NAD. AAO x 3.  Vascular Examination: Capillary refill time immediate b/l. Palpable pedal pulses. Pedal hair present b/l. No pain with calf compression b/l. Skin temperature gradient WNL b/l. No cyanosis or clubbing b/l. No ischemia or gangrene noted b/l.   Neurological Examination: Sensation grossly intact b/l with 10 gram monofilament. Vibratory sensation intact b/l.   Dermatological Examination: Pedal skin with normal turgor, texture and tone b/l.  No open wounds. No interdigital macerations.   Toenails 1-5 b/l thick, discolored, elongated with subungual debris and pain on dorsal palpation.   No hyperkeratotic nor porokeratotic lesions present on today's visit.  Musculoskeletal Examination: Muscle strength 5/5 to all lower extremity muscle groups bilaterally. Lower extremity amputation(s): digital amputation right second digit. HAV with bunion deformity noted b/l LE.SABRA No pain,  crepitus or joint limitation noted with ROM b/l LE.  Patient ambulates independently without assistive aids.  Radiographs: None  Last A1c:      Latest Ref Rng & Units 11/30/2023    1:28 PM 05/31/2023    8:32 AM  Hemoglobin A1C  Hemoglobin-A1c 4.6 - 6.5 % 5.9  5.4    Assessment/Plan: 1. Pain due to onychomycosis of toenails of both feet   2. History of amputation of lesser toe of right foot (HCC)   3. Diabetes mellitus type 2 with complications [facility])     Consent given for treatment. Patient examined. All patient's and/or POA's questions/concerns addressed on today's visit. Mycotic toenails 3-5 b/l, b/l great toes and left 2nd digit debrided in length and girth without incident. Continue foot and shoe inspections daily. Monitor blood glucose per PCP/Endocrinologist's recommendations.Continue soft, supportive shoe gear daily. Report any pedal injuries to medical professional. Call office if there are any quesitons/concerns. -Patient/POA to call should there be question/concern in the interim.   Return in about 3 months (around 04/13/2024).  Christopher [name], DPM      [place] LOCATION: [address], [place] [address]                   Office [phone]   [place] LOCATION: [address] [place], [place] [address] Office [phone])  [phone]  

## 2024-01-26 ENCOUNTER — Other Ambulatory Visit (HOSPITAL_COMMUNITY): Payer: Self-pay

## 2024-01-27 ENCOUNTER — Other Ambulatory Visit (HOSPITAL_COMMUNITY): Payer: Self-pay

## 2024-01-28 ENCOUNTER — Other Ambulatory Visit (HOSPITAL_COMMUNITY): Payer: Self-pay

## 2024-02-07 ENCOUNTER — Other Ambulatory Visit: Payer: Self-pay | Admitting: Internal Medicine

## 2024-02-07 ENCOUNTER — Ambulatory Visit: Attending: Cardiology

## 2024-02-07 DIAGNOSIS — I5022 Chronic systolic (congestive) heart failure: Secondary | ICD-10-CM | POA: Diagnosis not present

## 2024-02-07 DIAGNOSIS — Z95810 Presence of automatic (implantable) cardiac defibrillator: Secondary | ICD-10-CM

## 2024-02-09 ENCOUNTER — Other Ambulatory Visit: Payer: Self-pay | Admitting: Internal Medicine

## 2024-02-09 NOTE — Progress Notes (Signed)
 EPIC Encounter for ICM Monitoring  Patient Name: Christopher [name] is a 73 y.o. male Date: 02/09/2024 Primary Care Physican: [name], MD Primary Cardiologist: [facility] Electrophysiologist: Inocencio 09/08/2023 Weight: 226 lbs (has not been weighing daily) 09/21/2023 Office Weight: 225 lbs 10/19/2023 Office Weight: 227 lbs 12/24/2023 Weight: 227 lbs   Spoke with patient and heart failure questions reviewed.  Transmission results reviewed.  Pt asymptomatic for fluid accumulation.  Reports feeling well at this time and voices no complaints.        Optivol thoracic impedance suggesting normal fluid levels within the last month.   Prescribed:  Furosemide  40 mg take 1 tablet by mouth daily.     Spironolactone  25 mg take 1 tablet (25 mg total) daily   Labs: 08/18/2023 Creatinine 1.35, BUN 27, Potassium 3.8, Sodium 138  08/13/2023 Creatinine 1.40, BUN 18, Potassium 3.3, Sodium 139, GFR 53  08/12/2023 Creatinine 1.49, BUN 18, Potassium 3.3, Sodium 142, GFR 49  08/10/2023 Creatinine 1.55, BUN 19, Potassium 3.9, Sodium 136, GFR 47 A complete set of results can be found in Results Review.   Recommendations:  No changes and encouraged to call if experiencing any fluid symptoms.  Encouraged to call to schedule appt with Dr Mona.   Follow-up plan: ICM clinic phone appointment on 03/13/2024.   91 day device clinic remote transmission 05/15/2024.      EP/Cardiology Office Visits:   He attempted to call office for appt with Dr Mona but could not get through.  Will try again.   Recall 12/20/2023 with Dr Mona.  Recall 06/11/2024 with Dr Inocencio.   Copy of ICM check sent to Dr. Inocencio.  3 month ICM trend: 02/07/2024.    12-14 Month ICM trend:     [name], RN 02/09/2024 3:11 PM

## 2024-02-14 ENCOUNTER — Ambulatory Visit: Payer: Medicare Other

## 2024-02-14 DIAGNOSIS — I428 Other cardiomyopathies: Secondary | ICD-10-CM

## 2024-02-14 LAB — CUP PACEART REMOTE DEVICE CHECK
Battery Remaining Longevity: 47 mo
Battery Voltage: 2.98 V
Brady Statistic RV Percent Paced: 0.45 %
Date Time Interrogation Session: 20250811033522
HighPow Impedance: 72 Ohm
Implantable Lead Connection Status: 753985
Implantable Lead Implant Date: 20181102
Implantable Lead Location: 753860
Implantable Pulse Generator Implant Date: 20181102
Lead Channel Impedance Value: 418 Ohm
Lead Channel Impedance Value: 494 Ohm
Lead Channel Pacing Threshold Amplitude: 0.625 V
Lead Channel Pacing Threshold Pulse Width: 0.4 ms
Lead Channel Sensing Intrinsic Amplitude: 4.5 mV
Lead Channel Sensing Intrinsic Amplitude: 4.5 mV
Lead Channel Setting Pacing Amplitude: 2 V
Lead Channel Setting Pacing Pulse Width: 0.4 ms
Lead Channel Setting Sensing Sensitivity: 0.3 mV
Zone Setting Status: 755011
Zone Setting Status: 755011

## 2024-02-15 ENCOUNTER — Ambulatory Visit: Payer: Self-pay | Admitting: Cardiology

## 2024-02-19 ENCOUNTER — Other Ambulatory Visit: Payer: Self-pay | Admitting: Internal Medicine

## 2024-02-19 ENCOUNTER — Other Ambulatory Visit (HOSPITAL_COMMUNITY): Payer: Self-pay | Admitting: Internal Medicine

## 2024-03-03 ENCOUNTER — Ambulatory Visit: Attending: Internal Medicine | Admitting: Internal Medicine

## 2024-03-03 VITALS — BP 108/74 | HR 73 | Ht 70.0 in | Wt 229.4 lb

## 2024-03-03 DIAGNOSIS — I493 Ventricular premature depolarization: Secondary | ICD-10-CM | POA: Diagnosis not present

## 2024-03-03 DIAGNOSIS — I428 Other cardiomyopathies: Secondary | ICD-10-CM | POA: Diagnosis not present

## 2024-03-03 DIAGNOSIS — I5022 Chronic systolic (congestive) heart failure: Secondary | ICD-10-CM

## 2024-03-03 DIAGNOSIS — I447 Left bundle-branch block, unspecified: Secondary | ICD-10-CM | POA: Diagnosis not present

## 2024-03-03 DIAGNOSIS — Z95810 Presence of automatic (implantable) cardiac defibrillator: Secondary | ICD-10-CM | POA: Diagnosis not present

## 2024-03-03 LAB — COMPREHENSIVE METABOLIC PANEL WITH GFR
ALT: 14 IU/L (ref 0–44)
AST: 20 IU/L (ref 0–40)
Albumin: 4.3 g/dL (ref 3.8–4.8)
Alkaline Phosphatase: 37 IU/L — ABNORMAL LOW (ref 44–121)
BUN/Creatinine Ratio: 14 (ref 10–24)
BUN: 28 mg/dL — ABNORMAL HIGH (ref 8–27)
Bilirubin Total: 0.6 mg/dL (ref 0.0–1.2)
CO2: 20 mmol/L (ref 20–29)
Calcium: 9.6 mg/dL (ref 8.6–10.2)
Chloride: 101 mmol/L (ref 96–106)
Creatinine, Ser: 2.07 mg/dL — ABNORMAL HIGH (ref 0.76–1.27)
Globulin, Total: 2.4 g/dL (ref 1.5–4.5)
Glucose: 97 mg/dL (ref 70–99)
Potassium: 4.1 mmol/L (ref 3.5–5.2)
Sodium: 138 mmol/L (ref 134–144)
Total Protein: 6.7 g/dL (ref 6.0–8.5)
eGFR: 33 mL/min/1.73 — ABNORMAL LOW (ref >59)

## 2024-03-03 MED ORDER — FENOFIBRIC ACID 45 MG PO CPDR: 1.0000 | DELAYED_RELEASE_CAPSULE | Freq: Every day | ORAL | 3 refills | Status: AC

## 2024-03-03 NOTE — Patient Instructions (Addendum)
 Medication Instructions:  DECREASE dose of fenofibrate  to 45mg  daily -- new prescription   *If you need a refill on your cardiac medications before your next appointment, please call your pharmacy*  Lab Work: BMET and BNP today -- 1st Floor  If you have labs (blood work) drawn today and your tests are completely normal, you will receive your results only by: MyChart Message (if you have MyChart) OR A paper copy in the mail If you have any lab test that is abnormal or we need to change your treatment, we will call you to review the results.  Testing/Procedures: Your physician has requested that you have an echocardiogram. Echocardiography is a painless test that uses sound waves to create images of your heart. It provides your doctor with information about the size and shape of your heart and how well your heart's chambers and valves are working. This procedure takes approximately one hour. There are no restrictions for this procedure. Please do NOT wear cologne, perfume, aftershave, or lotions (deodorant is allowed). Please arrive 15 minutes prior to your appointment time.  Please note: We ask at that you not bring children with you during ultrasound (echo/ vascular) testing. Due to room size and safety concerns, children are not allowed in the ultrasound rooms during exams. Our front office staff cannot provide observation of children in our lobby area while testing is being conducted. An adult accompanying a patient to their appointment will only be allowed in the ultrasound room at the discretion of the ultrasound technician under special circumstances. We apologize for any inconvenience.  DUE Late November 2025  Follow-Up: At [facility], you and your health needs are our priority.  As part of our continuing mission to provide you with exceptional heart care, our providers are all part of one team.  This team includes your primary Cardiologist (physician) and Advanced Practice  Providers or APPs (Physician Assistants and Nurse Practitioners) who all work together to provide you with the care you need, when you need it.  Your next appointment:    3 months with Dr. Mona  We recommend signing up for the patient portal called MyChart.  Sign up information is provided on this After Visit Summary.  MyChart is used to connect with patients for Virtual Visits (Telemedicine).  Patients are able to view lab/test results, encounter notes, upcoming appointments, etc.  Non-urgent messages can be sent to your provider as well.   To learn more about what you can do with MyChart, go to [email or web address].   Other Instructions

## 2024-03-03 NOTE — Progress Notes (Signed)
 OFFICE NOTE  Chief Complaint:  Follow-up heart failure  Primary Care Physician: [name], MD  HPI:  Christopher [name] is a 73 y.o. 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  Subsequently he had cardiac arrest and had a single-chamber ICD placed in 2018 due to nonischemic cardiomyopathy.  He has had a history of atrial fibrillation, hypertension dyslipidemia and sleep apnea.  He is followed by Dr. Kelsie for his ICD.  At one point his LVEF had improved up to 55 to 60% on medical therapy however this past summer he had a pontine stroke associated diplopia.  Repeat echocardiogram showed a newly reduced EF 30 to 35% with anterior apical and inferoapical wall motion abnormality suggestive of Takatsubo versus LAD territory ischemia.  Given recent stroke ischemic evaluation was not performed.  He had not been complaining of chest pain.  Subsequently followed up with Hao Meng, PA-C, who has been increasing his heart failure medicine regimen, titrating his Entresto  up to the highest dose and adding Farxiga  in addition to spironolactone , beta-blocker and other heart failure therapies.  Repeat echocardiogram was performed in November which showed improvement in LVEF up to 40 to 45% however his EF is not normalized.  He denies any anginal symptoms but does get some mild shortness of breath with exertion.  12/29/2021  Christopher [name] returns today for follow-up.  Please report his LVEF has improved further slightly up to 45 to 50%.  The mitral valve gradients suggest some mild narrowing of the mitral valve annuloplasty without severe stenosis.  He did enroll in a clinical research trial for elevated triglycerides.  He was given a coronary CT scan as part of that study which did result in being abnormal suggesting a positive FFR and possible occlusion proximal  to the known RCA stent.  Despite this finding he is completely asymptomatic.  He denies chest pain or worsening shortness of breath.  Since this study was not performed for symptoms of angina, I am less inclined to need to follow this up.  He is also asking today about Viagra .  06/25/2022  Christopher [name] is seen today in follow-up.  He is maintained in the clinical research trial to lower triglycerides.  He seems to have had no issues with that.  Continues to have remote defibrillator checks which show no issues.  In June showed stable LVEF at 45 to 50% with a stable mitral valve gradient.  He did have a CT with FFR at that time as part of his clinical research trial.  This suggested some obstructive CAD proximal to the RCA stent however he has been asymptomatic.  Since this was only performed as part of the clinical research trial and he was asymptomatic we did not pursue any therapy.  It was felt that the FFR may be spurious because of the stent.  He reports he still does not have any anginal symptoms.  04/30/2023  Christopher [name] returns today for follow-up.  He has completed the clinical research trial regarding high triglycerides.  He did have some subsequent labs which showed marked reduction in triglycerides which were recently normal at 75 with an LDL of 60.  Unfortunately he is no longer on therapy.  As per the research trial as above he had a CT scan which showed probable proximal occlusion of the RCA stent however there are  collaterals that he is asymptomatic without chest pain.  Blood pressure was normal today.  03/03/2024  Christopher [name] returns today for follow-up of heart failure.  Earlier this year he had a repeat echo which showed a decline in LVEF down to 30 to 35%.  Subsequently was sent for left heart catheterization which showed no significant change in his coronaries with mild nonobstructive coronary disease.  LVEDP was 14 mmHg.  He had a subsequent follow-up with Dr. Inocencio.  He was found to  have some frequent PVCs.  He underwent a 2-week monitor which showed a burden of PVCs of 14%.  He was advised to start mexiletine 250 mg twice a day with the thought that this might be causing a PVC related cardiomyopathy.  He reports he is feeling fairly well although had a fall the other day when coming out of a bar.  He said he had only had 1 drink and did not lose consciousness.  He does have a lot of scabbing on his right knee and right shin.  He had some significant worsening of his renal function earlier this year.  A bit repeat metabolic profile was ordered but never obtained.  I noted that he is on too high of a dose of fenofibrate  given his decreased GFR and will need to reduce that.  PMHx:  Past Medical History:  Diagnosis Date   Arthritis    Atrial fibrillation (HCC)    post op, intol of anticoag   Cardiac arrest (HCC)    a. s/p MDT single chamber ICD; 11-10-18- pt denies having a heart attack   Cataract    CHF (congestive heart failure) (HCC)    Chronic kidney disease    kidney stone   Coronary artery disease    a. 2/7 Cath: LM nl, LAD min irregs, LCX min irregs, RI 40, RCA 69m, EF 55-60% basal to mid inf HK, 3-4+ MR;  b. 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 Mitral valve prolapse with flail segment of posterior leaflet and severe MR by TEE, remote h/o bacterial endocarditis    Sleep apnea    NPSG 01/21/06- AHI 40.7/hr cpap   Stroke [facility])    summer 2022- one messed up vision, the  other balance   Subacute bacterial endocarditis 03/22/2008   Strep viridans   Ventricular fibrillation (HCC) 11/07/2019   appropriate shock (36J) for VF delivered    Past Surgical History:  Procedure Laterality Date   AMPUTATION  07/28/2012   Procedure: AMPUTATION DIGIT;  Surgeon: Deward DELENA Schwartz, MD;  Location: [facility] ORS;  Service: Orthopedics;  Laterality: Right;  2nd toe   CARDIAC CATHETERIZATION     COLONOSCOPY     EP IMPLANTABLE DEVICE N/A 05/01/2016   Procedure: Loop Recorder Removal;  Surgeon: Lynwood Rakers, MD;  Location: [facility] INVASIVE CV LAB;  Service: Cardiovascular;  Laterality: N/A;   FOOT SURGERY     BILATERAL TOES   ICD IMPLANT N/A 05/07/2017  Medtronic Visia AF MRI VR SureScan implanted by Dr Inocencio following VF arrest   Implantable loop recorder placement  03/31/2013   MDT Linq implanted by Dr Kelsie to evaluate for further afib   INTRAOPERATIVE TRANSESOPHAGEAL ECHOCARDIOGRAM N/A 09/27/2012   Procedure: INTRAOPERATIVE TRANSESOPHAGEAL ECHOCARDIOGRAM;  Surgeon: Sudie VEAR Laine, MD;  Location: [facility] OR;  Service: Open Heart Surgery;  Laterality: N/A;   LEFT AND RIGHT HEART CATHETERIZATION WITH CORONARY ANGIOGRAM N/A 08/12/2012   Procedure: LEFT AND RIGHT HEART CATHETERIZATION WITH CORONARY ANGIOGRAM;  Surgeon: Ezra GORMAN Shuck, MD;  Location: [facility] CATH LAB;  Service: Cardiovascular;  Laterality: N/A;   LEFT HEART CATH AND CORONARY ANGIOGRAPHY N/A 05/06/2017   Procedure: LEFT HEART CATH AND CORONARY ANGIOGRAPHY;  Surgeon: Anner Alm ORN, MD;  Location: [facility] INVASIVE CV LAB;  Service: Cardiovascular;  Laterality: N/A;   LEFT HEART CATH AND CORONARY ANGIOGRAPHY N/A 08/12/2023   Procedure: LEFT HEART CATH AND CORONARY ANGIOGRAPHY;  Surgeon: Swaziland, Peter M, MD;  Location: [facility] INVASIVE CV LAB;  Service: Cardiovascular;  Laterality: N/A;   LOOP RECORDER IMPLANT N/A 03/31/2013   Procedure: LOOP RECORDER IMPLANT;  Surgeon: Lynwood JONETTA Kelsie, MD;  Location: [facility] CATH LAB;  Service: Cardiovascular;   Laterality: N/A;   MITRAL VALVE REPAIR Right 09/27/2012   Procedure: MINIMALLY INVASIVE MITRAL VALVE REPAIR (MVR);  Surgeon: Sudie VEAR Laine, MD;  Location: [facility] OR;  Service: Open Heart Surgery;  Laterality: Right;  Ultrasound guided   PERCUTANEOUS CORONARY STENT INTERVENTION (PCI-S) N/A 08/25/2012   Procedure: PERCUTANEOUS CORONARY STENT INTERVENTION (PCI-S);  Surgeon: Ozell Fell, MD;  Location: [facility] CATH LAB;  Service: Cardiovascular;  Laterality: N/A;   POLYPECTOMY     SEPTOPLASTY     Dr. Floy   TEE WITHOUT CARDIOVERSION N/A 08/12/2012   Procedure: TRANSESOPHAGEAL ECHOCARDIOGRAM (TEE);  Surgeon: Ezra GORMAN Shuck, MD;  Location: [facility] ENDOSCOPY;  Service: Cardiovascular;  Laterality: N/A;   TONSILLECTOMY     UVULOPALATOPHARYNGOPLASTY      FAMHx:  Family History  Problem Relation Age of Onset   Diabetes Mother    Stroke Mother    Heart disease Father    Colon cancer Father 9   Diabetes Father    Renal cancer Father    Cancer Father    Sudden death Brother    Heart attack Brother    Esophageal cancer Neg Hx    Rectal cancer Neg Hx    Stomach cancer Neg Hx    Colon polyps Neg Hx     SOCHx:   reports that he quit smoking about 45 years ago. His smoking use included cigarettes. He started smoking about 50 years ago. He has a 12.5 pack-year smoking history. He has never used smokeless tobacco. He reports current alcohol use. He reports that he does not use drugs.  ALLERGIES:  Allergies  Allergen Reactions   Codeine Other (See Comments)    Causes bad constipation   Dust Mite Extract Cough   Pollen Extract Cough    ROS: Pertinent items noted in HPI and remainder of comprehensive ROS otherwise negative.  HOME MEDS: Current Outpatient Medications on File Prior to Visit  Medication Sig Dispense Refill   Accu-Chek Softclix Lancets lancets 1 each by Other route See admin instructions. Use TID 100 each 12   acetaminophen  (TYLENOL ) 500 MG tablet Take 500 mg by mouth every 6  (six) hours as needed for mild pain (pain score 1-3) or headache.     atorvastatin  (LIPITOR) 40 MG tablet Take 1 tablet (40 mg total)  by mouth daily. 100 tablet 3   BD PEN NEEDLE SHORT ULTRAFINE 31G X 8 MM MISC USE TWICE DAILY FOR INSULIN   INJECTION 200 each 0   blood glucose meter kit and supplies KIT Dispense based on patient and insurance preference. Use up to four times daily as directed. 1 each 0   cetirizine (ZYRTEC) 10 MG tablet Take 10 mg by mouth daily.     cholecalciferol  (VITAMIN D ) 25 MCG (1000 UNIT) tablet Take 1 tablet by mouth daily.      clopidogrel  (PLAVIX ) 75 MG tablet Take 1 tablet (75 mg total) by mouth daily. 100 tablet 3   dapagliflozin  propanediol (FARXIGA ) 10 MG TABS tablet Take 1 tablet (10 mg total) by mouth daily. 100 tablet 3   fenofibrate  (TRICOR ) 145 MG tablet Take 1 tablet (145 mg total) by mouth daily. 100 tablet 3   fluticasone  (FLONASE ) 50 MCG/ACT nasal spray Place 2 sprays into both nostrils daily. 64 g 3   furosemide  (LASIX ) 40 MG tablet TAKE 1 TABLET BY MOUTH DAILY 100 tablet 2   glucose blood (ACCU-CHEK GUIDE TEST) test strip Use as instructed 100 each 12   Insulin  Lispro Prot & Lispro (HUMALOG  MIX 75/25 KWIKPEN) (75-25) 100 UNIT/ML Kwikpen Inject 18 Units into the skin in the morning and at bedtime. 45 mL 3   metFORMIN  (GLUCOPHAGE ) 1000 MG tablet Take 1 tablet (1,000 mg total) by mouth 2 (two) times daily with a meal. 200 tablet 3   metoprolol  succinate (TOPROL -XL) 100 MG 24 hr tablet Take 1 tablet (100 mg total) by mouth daily. 100 tablet 3   mexiletine (MEXITIL) 250 MG capsule Take 1 capsule (250 mg total) by mouth 2 (two) times daily. 180 capsule 3   OVER THE COUNTER MEDICATION 1 tablet in the morning and at bedtime. [facility] - Restore multivitamin     sacubitril -valsartan  (ENTRESTO ) 97-103 MG Take 1 tablet by mouth 2 (two) times daily. 180 tablet 3   spironolactone  (ALDACTONE ) 25 MG tablet TAKE 1 TABLET BY MOUTH DAILY 90 tablet 2   tamsulosin  (FLOMAX )  0.4 MG CAPS capsule Take 1 capsule (0.4 mg total) by mouth daily. 100 capsule 3   No current facility-administered medications on file prior to visit.    LABS/IMAGING: No results found for this or any previous visit (from the past 48 hours). No results found.  LIPID PANEL:    Component Value Date/Time   CHOL 163 11/30/2023 1328   CHOL 120 12/23/2021 1502   TRIG 273.0 (H) 11/30/2023 1328   HDL 42.90 11/30/2023 1328   HDL 38 (L) 12/23/2021 1502   CHOLHDL 4 11/30/2023 1328   VLDL 54.6 (H) 11/30/2023 1328   LDLCALC 66 11/30/2023 1328   LDLCALC 60 12/23/2021 1502   LDLDIRECT 52.0 07/18/2019 1454     WEIGHTS: Wt Readings from Last 3 Encounters:  03/03/24 229 lb 6.4 oz (104.1 kg)  01/12/24 236 lb (107 kg)  11/30/23 236 lb 6.4 oz (107.2 kg)    VITALS: BP 108/74 (BP Location: Right Arm, Patient Position: Sitting, Cuff Size: Large)   Pulse 73   Ht 5' 10 (1.778 m)   Wt 229 lb 6.4 oz (104.1 kg)   SpO2 97%   BMI 32.92 kg/m   EXAM: General appearance: alert and no distress Neck: no carotid bruit, no JVD, and thyroid  not enlarged, symmetric, no tenderness/mass/nodules Lungs: clear to auscultation bilaterally Heart: regular rate and rhythm, S1, S2 normal, no murmur, click, rub or gallop Abdomen: soft, non-tender; bowel sounds  normal; no masses,  no organomegaly Extremities: extremities normal, atraumatic, no cyanosis or edema and varicose veins noted Pulses: 2+ and symmetric Skin: Pale, warm, dry, scabbing over the right knee and anterior shin Neurologic: Grossly normal Psych: Pleasant  EKG: EKG Interpretation Date/Time:  Friday March 03 2024 09:21:06 EDT Ventricular Rate:  75 PR Interval:  206 QRS Duration:  158 QT Interval:  444 QTC Calculation: 495 R Axis:   -56  Text Interpretation: Normal sinus rhythm Right bundle branch block Left anterior fascicular block Bifascicular block Inferior infarct (cited on or before 19-Oct-2023) When compared with ECG of 19-Oct-2023  15:13, Premature ventricular complexes are no longer Present QRS axis Shifted right Confirmed by Mona Kent [phone]) on 03/03/2024 9:35:41 AM    ASSESSMENT: CAD status post PCI to the RCA in 2014 Chronic systolic heart failure, LVEF 30 to 35% (08/2023), NYHA class II symptoms Coronary CTA suggested possible obstruction proximal to the right coronary artery stent (12/2021)-asymptomatic -cardiac catheter revealed mild nonobstructive coronary disease (08/2023) Cardiac arrest in 2018 due to nonischemic cardiomyopathy status post AICD History of subacute bacterial endocarditis status post mitral valve repair History of stroke in 02/2021 Hypertension Dyslipidemia OSA on CPAP Erectile dysfunction  PLAN: 1.   Christopher [name] had a recent further decline in LVEF down to 30 to 35%.  Left heart catheterization showed mild nonobstructive coronary disease.  Subsequently he has had further titration of his heart failure medications.  He was subsequently found to have frequent PVCs by Dr. Inocencio and started on mexiletine.  He has had 2 episodes of nausea and vomiting but otherwise seems to be tolerating the medicine well.  No PVCs were noted on his EKG today.  I am assuming that his burden of PVCs has declined, however he does not have any scheduled follow-up with Dr. Inocencio or repeat monitors ordered.  I will plan a repeat echocardiogram in about 3 months to see if his LV function is recovering.  Follow-up with me at that time.  Also reduce his fenofibrate  today down to 48 mg due to worsening renal function.  Will repeat his comprehensive metabolic profile and BNP and may need to adjust his heart failure GDMT accordingly.  Kent KYM Mona, MD, [facility], FNLA, FACP  [place]  [facility]  Medical Director of the [facility] &  Cardiovascular Risk Reduction Clinic Diplomate of the American Board of Clinical Lipidology Attending Cardiologist  Direct Dial: [phone]  Fax: [phone]   Website:  [email or web address] Kent JAYSON Mona 03/03/2024, 9:36 AM

## 2024-03-05 LAB — BRAIN NATRIURETIC PEPTIDE: BNP: 431.7 pg/mL — ABNORMAL HIGH (ref 0.0–100.0)

## 2024-03-07 ENCOUNTER — Ambulatory Visit: Payer: Self-pay | Admitting: Internal Medicine

## 2024-03-07 DIAGNOSIS — Z79899 Other long term (current) drug therapy: Secondary | ICD-10-CM

## 2024-03-08 ENCOUNTER — Encounter: Payer: Self-pay | Admitting: *Deleted

## 2024-03-08 DIAGNOSIS — Z006 Encounter for examination for normal comparison and control in clinical research program: Secondary | ICD-10-CM

## 2024-03-08 NOTE — Research (Signed)
 Spoke to Christopher [name] to inform him that he was receiving drug during the [facility] research study. Reminded him to follow up with Dr Mona (he has an appointment in Dec) for Tx of his TGs. Voices understanding. Thanked him for being in the study.

## 2024-03-13 ENCOUNTER — Ambulatory Visit: Attending: Cardiology

## 2024-03-13 DIAGNOSIS — Z95810 Presence of automatic (implantable) cardiac defibrillator: Secondary | ICD-10-CM

## 2024-03-13 DIAGNOSIS — I5022 Chronic systolic (congestive) heart failure: Secondary | ICD-10-CM

## 2024-03-17 ENCOUNTER — Telehealth: Payer: Self-pay

## 2024-03-17 NOTE — Telephone Encounter (Signed)
 Remote ICM transmission received.  Attempted call to patient regarding ICM remote transmission and no answer.

## 2024-03-17 NOTE — Progress Notes (Signed)
 EPIC Encounter for ICM Monitoring  Patient Name: Christopher [name] is a 73 y.o. male Date: 03/17/2024 Primary Care Physican: [name], MD Primary Cardiologist: [facility] Electrophysiologist: Inocencio 09/08/2023 Weight: 226 lbs (has not been weighing daily) 09/21/2023 Office Weight: 225 lbs 10/19/2023 Office Weight: 227 lbs 12/24/2023 Weight: 227 lbs 03/03/2024 Office Weight: 229 lbs   Attempted call to patient and unable to reach.  Transmission results reviewed.        Optivol thoracic impedance suggesting normal fluid levels within the last month with exception of possible fluid accumulation from 8/11-8/22.   Prescribed:  Furosemide  40 mg take 1 tablet by mouth daily.    Per 9/2 phone note Dr Mona increased dosage to 80 mg daily due to BNP results. Spironolactone  25 mg take 1 tablet (25 mg total) daily   Labs: 03/03/2024 Creatinine 2.07, BUN 28, Potassium 4.1, Sodium 138, GFR 33, BNP 431.7 11/30/2023 Creatinine 2.16, BUN 36, Potassium 3.6, Sodium 139  08/18/2023 Creatinine 1.35, BUN 27, Potassium 3.8, Sodium 138  08/13/2023 Creatinine 1.40, BUN 18, Potassium 3.3, Sodium 139, GFR 53  08/12/2023 Creatinine 1.49, BUN 18, Potassium 3.3, Sodium 142, GFR 49  08/10/2023 Creatinine 1.55, BUN 19, Potassium 3.9, Sodium 136, GFR 47 A complete set of results can be found in Results Review.   Recommendations:  Unable to reach.     Follow-up plan: ICM clinic phone appointment on 04/24/2024.   91 day device clinic remote transmission 05/15/2024.      EP/Cardiology Office Visits:   06/16/2024 with Dr Mona.  Recall 06/11/2024 with Dr Inocencio.   Copy of ICM check sent to Dr. Inocencio.  3 month ICM trend: 03/13/2024.    12-14 Month ICM trend:     Christopher GORMAN Garner, RN 03/17/2024 8:28 AM

## 2024-03-20 MED ORDER — FUROSEMIDE 40 MG PO TABS: 80.0000 mg | ORAL_TABLET | Freq: Every day | ORAL | Status: DC

## 2024-03-24 ENCOUNTER — Other Ambulatory Visit (HOSPITAL_COMMUNITY): Payer: Self-pay | Admitting: Internal Medicine

## 2024-03-25 ENCOUNTER — Encounter: Payer: Self-pay | Admitting: Internal Medicine

## 2024-03-27 ENCOUNTER — Encounter: Payer: Self-pay | Admitting: Internal Medicine

## 2024-03-27 DIAGNOSIS — Z79899 Other long term (current) drug therapy: Secondary | ICD-10-CM | POA: Diagnosis not present

## 2024-03-27 MED ORDER — FUROSEMIDE 40 MG PO TABS: 80.0000 mg | ORAL_TABLET | Freq: Every day | ORAL | 1 refills | Status: DC

## 2024-03-28 LAB — BASIC METABOLIC PANEL WITH GFR
BUN/Creatinine Ratio: 12 (ref 10–24)
BUN: 24 mg/dL (ref 8–27)
CO2: 22 mmol/L (ref 20–29)
Calcium: 9.5 mg/dL (ref 8.6–10.2)
Chloride: 101 mmol/L (ref 96–106)
Creatinine, Ser: 2.04 mg/dL — AB (ref 0.76–1.27)
Glucose: 79 mg/dL (ref 70–99)
Potassium: 4.1 mmol/L (ref 3.5–5.2)
Sodium: 142 mmol/L (ref 134–144)
eGFR: 34 mL/min/1.73 — AB (ref >59)

## 2024-03-28 LAB — BRAIN NATRIURETIC PEPTIDE: BNP: 401.2 pg/mL — AB (ref 0.0–100.0)

## 2024-03-31 MED ORDER — FUROSEMIDE 80 MG PO TABS: 80.0000 mg | ORAL_TABLET | Freq: Every day | ORAL | 3 refills | Status: DC

## 2024-03-31 MED ORDER — MEXILETINE HCL 150 MG PO CAPS: 150.0000 mg | ORAL_CAPSULE | Freq: Two times a day (BID) | ORAL | 2 refills | Status: DC

## 2024-03-31 NOTE — Progress Notes (Signed)
 Remote Loop Recorder Transmission

## 2024-04-24 ENCOUNTER — Ambulatory Visit: Attending: Cardiology

## 2024-04-24 DIAGNOSIS — Z95810 Presence of automatic (implantable) cardiac defibrillator: Secondary | ICD-10-CM | POA: Diagnosis not present

## 2024-04-24 DIAGNOSIS — I5022 Chronic systolic (congestive) heart failure: Secondary | ICD-10-CM | POA: Diagnosis not present

## 2024-04-26 ENCOUNTER — Ambulatory Visit: Admitting: Podiatry

## 2024-04-26 ENCOUNTER — Encounter: Payer: Self-pay | Admitting: Podiatry

## 2024-04-26 DIAGNOSIS — E118 Type 2 diabetes mellitus with unspecified complications: Secondary | ICD-10-CM | POA: Diagnosis not present

## 2024-04-26 DIAGNOSIS — B351 Tinea unguium: Secondary | ICD-10-CM | POA: Diagnosis not present

## 2024-04-26 DIAGNOSIS — M79674 Pain in right toe(s): Secondary | ICD-10-CM

## 2024-04-26 DIAGNOSIS — Z89421 Acquired absence of other right toe(s): Secondary | ICD-10-CM | POA: Diagnosis not present

## 2024-04-26 DIAGNOSIS — M79675 Pain in left toe(s): Secondary | ICD-10-CM

## 2024-04-26 NOTE — Progress Notes (Signed)
  Subjective:  Patient ID: Christopher [name], male    DOB: 07/09/1950,  MRN: [id number]  Christopher [name] presents to clinic today for at risk foot care. Patient has h/o NIDDM with amputation of digital amputation right second digit and painful mycotic toenails of both feet that are difficult to trim. Pain interferes with daily activities and wearing enclosed shoe gear comfortably.  Chief Complaint  Patient presents with   Anxiety   Diabetes    DFC IDDM A1C 5.9. LOV with PCP 11/30/23.   New problem(s): None.   PCP is [name], MD.  Allergies  Allergen Reactions   Codeine Other (See Comments)    Causes bad constipation   Dust Mite Extract Cough   Pollen Extract Cough    Review of Systems: Negative except as noted in the HPI.  Objective: No changes noted in today's physical examination. There were no vitals filed for this visit. Christopher [name] is a pleasant 73 y.o. male obese in NAD. AAO x 3.  Vascular Examination: CFT <3 seconds b/l LE. Palpable DP pulse(s) b/l LE. Palpable PT pulse(s) b/l LE. Lower extremity skin temperature gradient within normal limits. No edema noted b/l LE.   Neurological Examination: Sensation grossly intact b/l with 10 gram monofilament. Vibratory sensation intact b/l.   Dermatological Examination: Pedal skin thin, shiny and atrophic b/l. No open wounds. No interdigital macerations.   Toenails bilateral great toes, left 2nd digit and 3-5 b/l thick, discolored, elongated with subungual debris and pain on dorsal palpation.   No corns, calluses, nor porokeratotic lesions.  Musculoskeletal Examination: Muscle strength 5/5 to all lower extremity muscle groups bilaterally. HAV with bunion deformity noted b/l LE.SABRA No pain, crepitus or joint limitation noted with ROM b/l LE.  Patient ambulates independently without assistive aids.  Radiographs: None  Last A1c:      Latest Ref Rng & Units 11/30/2023    1:28 PM 05/31/2023    8:32 AM  Hemoglobin A1C   Hemoglobin-A1c 4.6 - 6.5 % 5.9  5.4    Assessment/Plan: 1. Pain due to onychomycosis of toenails of both feet   2. History of amputation of lesser toe of right foot   3. Diabetes mellitus type 2 with complications [facility])     -Patient was evaluated today. All questions/concerns addressed on today's visit. -Continue foot and shoe inspections daily. Monitor blood glucose per PCP/Endocrinologist's recommendations. -Patient to continue soft, supportive shoe gear daily. -Toenails bilateral great toes, bilateral 3rd toes, bilateral 4th toes, bilateral 5th toes, and left second digit debrided in length and girth without iatrogenic bleeding with sterile nail nipper and dremel.  -Patient/POA to call should there be question/concern in the interim.   Return in about 3 months (around 07/27/2024).  Christopher [name], DPM      [facility] LOCATION: [address], [place] [address]                   Office [phone]   [facility] LOCATION: [address] [place], [place] [address] Office [phone]

## 2024-04-28 NOTE — Progress Notes (Signed)
 EPIC Encounter for ICM Monitoring  Patient Name: Christopher [name] is a 73 y.o. male Date: 04/28/2024 Primary Care Physican: [name], MD Primary Cardiologist: [facility] Electrophysiologist: Inocencio 09/08/2023 Weight: 226 lbs (has not been weighing daily) 09/21/2023 Office Weight: 225 lbs 10/19/2023 Office Weight: 227 lbs 12/24/2023 Weight: 227 lbs 03/03/2024 Office Weight: 229 lbs 04/28/2024 Weight: 228-229 lbs   Spoke with patient and heart failure questions reviewed.  Transmission results reviewed.  Pt asymptomatic for fluid accumulation.  Reports feeling well at this time and voices no complaints.        Since 03/13/2024 ICM Remote Transmission: Optivol thoracic impedance suggesting normal fluid levels within the last month with the exception of possible fluid accumulation from 03/27/2024-04/07/2024.   Prescribed:  Furosemide  80 mg take 1 tablet (80 mg total) by mouth daily.    Spironolactone  25 mg take 1 tablet (25 mg total) daily   Labs: 03/03/2024 Creatinine 2.07, BUN 28, Potassium 4.1, Sodium 138, GFR 33, BNP 431.7 11/30/2023 Creatinine 2.16, BUN 36, Potassium 3.6, Sodium 139  08/18/2023 Creatinine 1.35, BUN 27, Potassium 3.8, Sodium 138  08/13/2023 Creatinine 1.40, BUN 18, Potassium 3.3, Sodium 139, GFR 53  08/12/2023 Creatinine 1.49, BUN 18, Potassium 3.3, Sodium 142, GFR 49  08/10/2023 Creatinine 1.55, BUN 19, Potassium 3.9, Sodium 136, GFR 47 A complete set of results can be found in Results Review.   Recommendations:  No changes and encouraged to call if experiencing any fluid symptoms.   Follow-up plan: ICM clinic phone appointment on 05/29/2024.   91 day device clinic remote transmission 05/15/2024.      EP/Cardiology Office Visits:   06/16/2024 with Dr Mona.  Recall 06/11/2024 with Dr Inocencio.   Copy of ICM check sent to Dr. Inocencio.  Remote monitoring is medically necessary for Heart Failure Management.    Daily Thoracic Impedance ICM trend: 01/24/2024 through  04/24/2024.    12-14 Month Thoracic Impedance ICM trend:     [name], RN 04/28/2024 2:34 PM

## 2024-05-03 ENCOUNTER — Encounter: Payer: Self-pay | Admitting: Podiatry

## 2024-05-14 ENCOUNTER — Encounter: Payer: Self-pay | Admitting: Internal Medicine

## 2024-05-15 ENCOUNTER — Ambulatory Visit: Payer: Medicare Other

## 2024-05-15 DIAGNOSIS — I5022 Chronic systolic (congestive) heart failure: Secondary | ICD-10-CM | POA: Diagnosis not present

## 2024-05-16 LAB — CUP PACEART REMOTE DEVICE CHECK
Battery Remaining Longevity: 47 mo
Battery Voltage: 2.98 V
Brady Statistic RV Percent Paced: 0.13 %
Date Time Interrogation Session: 20251110044225
HighPow Impedance: 75 Ohm
Implantable Lead Connection Status: 753985
Implantable Lead Implant Date: 20181102
Implantable Lead Location: 753860
Implantable Pulse Generator Implant Date: 20181102
Lead Channel Impedance Value: 494 Ohm
Lead Channel Impedance Value: 589 Ohm
Lead Channel Pacing Threshold Amplitude: 0.5 V
Lead Channel Pacing Threshold Pulse Width: 0.4 ms
Lead Channel Sensing Intrinsic Amplitude: 4.5 mV
Lead Channel Sensing Intrinsic Amplitude: 4.5 mV
Lead Channel Setting Pacing Amplitude: 2 V
Lead Channel Setting Pacing Pulse Width: 0.4 ms
Lead Channel Setting Sensing Sensitivity: 0.3 mV
Zone Setting Status: 755011
Zone Setting Status: 755011

## 2024-05-18 NOTE — Progress Notes (Signed)
 Remote ICD Transmission

## 2024-05-20 ENCOUNTER — Other Ambulatory Visit: Payer: Self-pay | Admitting: Physician Assistant

## 2024-05-23 ENCOUNTER — Ambulatory Visit: Payer: Self-pay | Admitting: Cardiology

## 2024-05-24 ENCOUNTER — Ambulatory Visit: Payer: Self-pay

## 2024-05-24 NOTE — Telephone Encounter (Signed)
 FYI Only or Action Required?: FYI only for provider: appointment scheduled on 05/30/2024.  Patient was last seen in primary care on 11/30/2023 by [name], MD.  Called Nurse Triage reporting Vomiting.  Symptoms began several months ago.  Triage Disposition: See PCP Within 2 Weeks  Patient/caregiver understands and will follow disposition?: Yes          Copied from CRM [id number]. Topic: Clinical - Red Word Triage >> May 24, 2024  2:51 PM Jasmin G wrote: [facility] that prompted transfer to Nurse Triage: Pt state that he has been feeling  nauseous and has been throwing up for about 2 months, he states that he cannot hold food down properly and has been losing taste to it. Reason for Disposition  Vomiting is a chronic symptom (recurrent or ongoing AND present > 4 weeks)  Answer Assessment - Initial Assessment Questions Vomiting and nausea intermittent for 2 months Denies body aches, fever, headache   VOMITING SEVERITY: How many times have you vomited in the past 24 hours?      No vomiting in past 24 hours; pt states he gets nauseous often but vomits 1-2 times a week; not nauseous every day  Denies blood in vomit  ABDOMEN PAIN: Are your having any abdomen pain? If Yes : How bad is it and what does it feel like? (e.g., crampy, dull, intermittent, constant)      Denies  DIARRHEA: Is there any diarrhea? If Yes, ask: How many times today?      Denies  CAUSE: What do you think is causing your vomiting?    Mexiletine medication; pt is not sure if this is causing it; pt stopped taking it x1 week ago; pt states he sent cardiologist a message that he stopped it  Protocols used: Vomiting-A-AH

## 2024-05-29 ENCOUNTER — Ambulatory Visit: Attending: Cardiology

## 2024-05-29 ENCOUNTER — Ambulatory Visit (HOSPITAL_BASED_OUTPATIENT_CLINIC_OR_DEPARTMENT_OTHER)
Admission: RE | Admit: 2024-05-29 | Discharge: 2024-05-29 | Disposition: A | Discharge status: Home / Self Care | Source: Ambulatory Visit | Attending: Internal Medicine | Admitting: Internal Medicine

## 2024-05-29 DIAGNOSIS — I428 Other cardiomyopathies: Secondary | ICD-10-CM | POA: Insufficient documentation

## 2024-05-29 DIAGNOSIS — Z95810 Presence of automatic (implantable) cardiac defibrillator: Secondary | ICD-10-CM

## 2024-05-29 DIAGNOSIS — I5022 Chronic systolic (congestive) heart failure: Secondary | ICD-10-CM | POA: Insufficient documentation

## 2024-05-29 LAB — ECHOCARDIOGRAM COMPLETE
Area-P 1/2: 2.69 cm2
MV M vel: 4.33 m/s
MV Peak grad: 75 mmHg
MV VTI: 1.32 cm2
P 1/2 time: 1142 ms
Radius: 0.6 cm
S' Lateral: 5.2 cm

## 2024-05-30 ENCOUNTER — Telehealth: Payer: Self-pay | Admitting: *Deleted

## 2024-05-30 ENCOUNTER — Ambulatory Visit

## 2024-05-30 ENCOUNTER — Ambulatory Visit: Admitting: Internal Medicine

## 2024-05-30 VITALS — BP 94/60 | HR 51 | Temp 98.1°F | Ht 70.0 in | Wt 219.2 lb

## 2024-05-30 DIAGNOSIS — E118 Type 2 diabetes mellitus with unspecified complications: Secondary | ICD-10-CM

## 2024-05-30 DIAGNOSIS — I5022 Chronic systolic (congestive) heart failure: Secondary | ICD-10-CM

## 2024-05-30 DIAGNOSIS — N1832 Chronic kidney disease, stage 3b: Secondary | ICD-10-CM | POA: Diagnosis not present

## 2024-05-30 DIAGNOSIS — I480 Paroxysmal atrial fibrillation: Secondary | ICD-10-CM

## 2024-05-30 DIAGNOSIS — R051 Acute cough: Secondary | ICD-10-CM

## 2024-05-30 DIAGNOSIS — I952 Hypotension due to drugs: Secondary | ICD-10-CM | POA: Diagnosis not present

## 2024-05-30 LAB — CBC
HCT: 36.8 % — ABNORMAL LOW (ref 39.0–52.0)
Hemoglobin: 12.4 g/dL — ABNORMAL LOW (ref 13.0–17.0)
MCHC: 33.6 g/dL (ref 30.0–36.0)
MCV: 89 fl (ref 78.0–100.0)
Platelets: 281 K/uL (ref 150.0–400.0)
RBC: 4.14 Mil/uL — ABNORMAL LOW (ref 4.22–5.81)
RDW: 14.7 % (ref 11.5–15.5)
WBC: 6.8 K/uL (ref 4.0–10.5)

## 2024-05-30 LAB — COMPREHENSIVE METABOLIC PANEL WITH GFR
ALT: 11 U/L (ref 0–53)
AST: 18 U/L (ref 0–37)
Albumin: 3.7 g/dL (ref 3.5–5.2)
Alkaline Phosphatase: 34 U/L — ABNORMAL LOW (ref 39–117)
BUN: 48 mg/dL — ABNORMAL HIGH (ref 6–23)
CO2: 31 meq/L (ref 19–32)
Calcium: 9.5 mg/dL (ref 8.4–10.5)
Chloride: 99 meq/L (ref 96–112)
Creatinine, Ser: 3.22 mg/dL — ABNORMAL HIGH (ref 0.40–1.50)
GFR: 18.19 mL/min — ABNORMAL LOW (ref >60.00)
Glucose, Bld: 106 mg/dL — ABNORMAL HIGH (ref 70–99)
Potassium: 3.9 meq/L (ref 3.5–5.1)
Sodium: 137 meq/L (ref 135–145)
Total Bilirubin: 0.6 mg/dL (ref 0.2–1.2)
Total Protein: 6.6 g/dL (ref 6.0–8.3)

## 2024-05-30 MED ORDER — DOXYCYCLINE HYCLATE 100 MG PO TABS: 100.0000 mg | ORAL_TABLET | Freq: Two times a day (BID) | ORAL | 0 refills | Status: DC

## 2024-05-30 MED ORDER — AMIODARONE HCL 200 MG PO TABS: ORAL_TABLET | ORAL | 3 refills | Status: AC

## 2024-05-30 NOTE — Progress Notes (Signed)
  Received: Today Hilty, Vinie BROCKS, MD  Kathrynne Kulinski, Mitzie RAMAN, RN Thanks .SABRA Sounds like he will see Dr. Inocencio and then me in early December - we can address afib burden then. Will see PCP today regarding N/V/D issues.  Dr. VEAR

## 2024-05-30 NOTE — Progress Notes (Unsigned)
 Subjective:   Patient ID: Christopher [name], male    DOB: Nov 23, 1950, 73 y.o.   MRN: [id number]  Discussed the use of AI scribe software for clinical note transcription with the patient, who gave verbal consent to proceed.  History of Present Illness Christopher [name] is a 73 year old male with atrial fibrillation and diabetes who presents with nausea, loss of appetite, and weakness.  For the past two to three weeks, he has experienced nausea, loss of appetite, and weakness. These symptoms are described as flu-like but without fever, aches, or pains. Although he has not vomited since last week, his appetite remains poor, and he consumes only small portions of food over extended periods. He ensures adequate fluid intake.  He reports a significant decrease in appetite, often spitting out food after chewing. He feels very weak and lightheaded, particularly when walking through his condo, necessitating the use of a chair for safe movement.  A persistent cough with phlegm has been present for the same duration as his other symptoms. No ear or sinus pain, but the cough affects his breathing.  He was recently prescribed 'mexiletine', which caused adverse effects, leading to a dosage reduction from 250 mg to 125 mg daily. Despite discontinuing the medication, his symptoms have not significantly improved.  He has a history of atrial fibrillation and diabetes, with stable blood sugar levels recently recorded at 100 and 94. He experiences weakness and lightheadedness when his blood sugar is low, which he manages with sweet drinks.  He recently underwent an echocardiogram and was informed that there may be an issue with his defibrillator; he has a follow-up scheduled at the AFib clinic.  Review of Systems  Constitutional:  Positive for activity change, appetite change, chills and fatigue. Negative for fever and unexpected weight change.  HENT:  Positive for congestion, postnasal drip, rhinorrhea and  sinus pressure. Negative for ear discharge, ear pain, sinus pain, sneezing, sore throat, tinnitus, trouble swallowing and voice change.   Eyes: Negative.   Respiratory:  Positive for cough and shortness of breath. Negative for chest tightness and wheezing.   Cardiovascular: Negative.   Gastrointestinal:  Positive for nausea and vomiting.  Musculoskeletal:  Positive for myalgias.  Neurological: Negative.     Objective:  Physical Exam Constitutional:      Appearance: He is well-developed.     Comments: Appears pale and weak compared to normal  HENT:     Head: Normocephalic and atraumatic.     Comments: Oropharynx with redness and clear drainage, nose with swollen turbinates, TMs normal bilaterally.  Neck:     Thyroid : No thyromegaly.  Cardiovascular:     Rate and Rhythm: Normal rate and regular rhythm.  Pulmonary:     Effort: Pulmonary effort is normal. No respiratory distress.     Breath sounds: Rhonchi present. No wheezing or rales.  Abdominal:     General: Bowel sounds are normal. There is no distension.     Palpations: Abdomen is soft.     Tenderness: There is no abdominal tenderness.  Musculoskeletal:     Cervical back: Normal range of motion.  Lymphadenopathy:     Cervical: No cervical adenopathy.  Skin:    General: Skin is warm and dry.  Neurological:     Mental Status: He is alert and oriented to person, place, and time.     Comments: Wheelchair needed after about 25 feet walking     Vitals:   05/30/24 1504  Temp: 98.1 F (  36.7 C)  TempSrc: Oral  Weight: 219 lb 3.2 oz (99.4 kg)  Height: 5' 10 (1.778 m)  I personally spent a total of 45 minutes in the care of the patient today including preparing to see the patient, getting/reviewing separately obtained history, performing a medically appropriate exam/evaluation, counseling and educating, and placing orders.   Assessment and Plan Assessment & Plan Possible pneumonia with cough, nausea, decreased appetite, and  weakness   He has experienced a persistent cough, nausea, decreased appetite, and weakness for 2-3 weeks. Crackles suggest possible pneumonia, with infection or pneumonia as differential diagnoses causing systemic symptoms. Ordered a chest x-ray to evaluate for pneumonia and started doxycycline  100 mg twice daily for 7 days. Blood work performed to assess overall health status.  Hypotension   Likely secondary to decreased oral intake and possible pneumonia. Blood pressure management is complicated by heart failure and atrial fibrillation. Hold Entresto  for 3-4 days to allow blood pressure to stabilize. Monitor blood pressure and symptoms closely.  Heart failure systolic  Managed with Entresto  and spironolactone . Entresto  is temporarily held due to hypotension. Resume Entresto  once blood pressure stabilizes and symptoms improve.

## 2024-05-30 NOTE — Telephone Encounter (Signed)
-----   Message from Nurse Mitzie RAMAN sent at 05/30/2024 11:58 AM EST ----- Regarding: AT/AF Burden 05/29/2024 Carelink report shows 100% burden which increased from 2.2% on 05/15/2024.  October report showed 0%.  Pt feels loss of appetite and nauseated for past few weeks.    Also noticed fluid accumulation since the start of AT/AF.     Could you please review and discuss with Dr Inocencio if needed.    Thanks Mitzie

## 2024-05-30 NOTE — Progress Notes (Signed)
 EPIC Encounter for ICM Monitoring  Patient Name: Christopher [name] is a 73 y.o. male Date: 05/30/2024 Primary Care Physican: [name], MD Primary Cardiologist: [facility] Electrophysiologist: Inocencio 09/08/2023 Weight: 226 lbs (has not been weighing daily) 09/21/2023 Office Weight: 225 lbs 10/19/2023 Office Weight: 227 lbs 12/24/2023 Weight: 227 lbs 03/03/2024 Office Weight: 229 lbs 04/28/2024 Weight: 228-229 lbs 05/30/2024 Weight: 229 lbs  Since 15-May-2024 VT-NS (>4 beats, >200 bpm)  3 Time in AF  24.0 hr/day (100.0%)   (AT/AF Burden has increased from 2.2% on 05/15/2024)   Spoke with patient and heart failure questions reviewed.  Transmission results reviewed.  Pt asymptomatic for fluid accumulation. He has a nurse visit today due to loss of appetite and ongoing nausea for several weeks.  He does like to eat restaurant foods but is eating very little due to nausea.     Since 04/24/2024 ICM Remote Transmission: Optivol thoracic impedance suggesting possible fluid accumulation starting 05/14/2024.  Possible fluid accumulation appears to correlate with AT/AF starting 05/14/2024.   Message sent to Device clinic Triage for review of AT/AF Burden increase since 05/15/2024.   Prescribed:  Furosemide  80 mg take 1 tablet (80 mg total) by mouth daily.    Spironolactone  25 mg take 1 tablet (25 mg total) daily   Labs: 03/27/2024 Creatinine 2.04, BUN 24, Potassium 4.1, Sodium 142, GFR 34 03/03/2024 Creatinine 2.07, BUN 28, Potassium 4.1, Sodium 138, GFR 33, BNP 431.7 11/30/2023 Creatinine 2.16, BUN 36, Potassium 3.6, Sodium 139  08/18/2023 Creatinine 1.35, BUN 27, Potassium 3.8, Sodium 138  08/13/2023 Creatinine 1.40, BUN 18, Potassium 3.3, Sodium 139, GFR 53  08/12/2023 Creatinine 1.49, BUN 18, Potassium 3.3, Sodium 142, GFR 49  08/10/2023 Creatinine 1.55, BUN 19, Potassium 3.9, Sodium 136, GFR 47 A complete set of results can be found in Results Review.   Recommendations:  Sent to Dr Mona  for review and recommendations if needed.   Pt confirmed he is taking Lasix  80 mg daily.  He has an office visit today, 05/30/2024 d/t ongoing nausea.     Follow-up plan: ICM clinic phone appointment on 06/05/2024 to recheck fluid levels.   91 day device clinic remote transmission 08/14/2024.      EP/Cardiology Office Visits:   06/16/2024 with Dr Mona.  Recall 06/11/2024 with Dr Inocencio.   Copy of ICM check sent to Dr. Inocencio.   Remote monitoring is medically necessary for Heart Failure Management.    Daily Thoracic Impedance ICM trend: 02/28/2024 through 05/29/2024.    12-14 Month Thoracic Impedance ICM trend:     [name], RN 05/30/2024 11:34 AM

## 2024-05-30 NOTE — Telephone Encounter (Signed)
 Called and spoke with the patient.  Advised him that it does appear that he is in new onset AF. He confirms that he has not felt well for ~ 1-2 months. Advised the patient that his AF seems to have started around 05/14/24, but if he was not feeling well prior to that, there may have been something underlying that triggered his AF.   The patient is aware that Dr. Inocencio would like him to be seen in the AF Clinic and he voices understanding and is agreeable.   I have advised him that I will message the AF Clinic and ask that they reach out to him directly to schedule.   Message sent to AF clinic staff to please schedule.

## 2024-05-30 NOTE — Patient Instructions (Addendum)
 We are going to check the x-ray and labs today.   We will have you stop the entresto  for 4-5 days and then if eating better resume on Saturday this week. If not eating better keep holding this.  We have sent in doxycycline  to take 1 pill twice a day for 1 week.

## 2024-05-30 NOTE — Telephone Encounter (Signed)
 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   Reviewed the patient's transmission.  Presenting- VS w/ irregular R-R interval- per episode list AF is ongoing since 05/14/24. 3 brief NSVT- each lasting 2 seconds or less.   Due to single lead and patient symptoms, will forward to Dr. Inocencio to review and advise further. Patient is not on OAC for any other reason.

## 2024-05-31 ENCOUNTER — Other Ambulatory Visit: Payer: Self-pay

## 2024-05-31 ENCOUNTER — Emergency Department (HOSPITAL_COMMUNITY)

## 2024-05-31 ENCOUNTER — Encounter (HOSPITAL_COMMUNITY): Payer: Self-pay

## 2024-05-31 ENCOUNTER — Inpatient Hospital Stay (HOSPITAL_COMMUNITY)
Admission: EM | Admit: 2024-05-31 | Discharge: 2024-06-08 | DRG: 659 | Disposition: A | Attending: Family Medicine | Admitting: Family Medicine

## 2024-05-31 ENCOUNTER — Encounter: Payer: Self-pay | Admitting: Internal Medicine

## 2024-05-31 ENCOUNTER — Ambulatory Visit: Payer: Self-pay | Admitting: Internal Medicine

## 2024-05-31 DIAGNOSIS — N179 Acute kidney failure, unspecified: Secondary | ICD-10-CM | POA: Diagnosis not present

## 2024-05-31 DIAGNOSIS — I493 Ventricular premature depolarization: Secondary | ICD-10-CM

## 2024-05-31 DIAGNOSIS — I5023 Acute on chronic systolic (congestive) heart failure: Secondary | ICD-10-CM | POA: Diagnosis present

## 2024-05-31 DIAGNOSIS — I10 Essential (primary) hypertension: Secondary | ICD-10-CM | POA: Diagnosis not present

## 2024-05-31 DIAGNOSIS — I2510 Atherosclerotic heart disease of native coronary artery without angina pectoris: Secondary | ICD-10-CM | POA: Diagnosis present

## 2024-05-31 DIAGNOSIS — E118 Type 2 diabetes mellitus with unspecified complications: Secondary | ICD-10-CM | POA: Diagnosis not present

## 2024-05-31 DIAGNOSIS — N1832 Chronic kidney disease, stage 3b: Secondary | ICD-10-CM | POA: Diagnosis not present

## 2024-05-31 DIAGNOSIS — N1330 Unspecified hydronephrosis: Secondary | ICD-10-CM | POA: Insufficient documentation

## 2024-05-31 DIAGNOSIS — I5022 Chronic systolic (congestive) heart failure: Secondary | ICD-10-CM | POA: Diagnosis present

## 2024-05-31 DIAGNOSIS — E66811 Obesity, class 1: Secondary | ICD-10-CM | POA: Diagnosis not present

## 2024-05-31 DIAGNOSIS — I959 Hypotension, unspecified: Secondary | ICD-10-CM | POA: Insufficient documentation

## 2024-05-31 DIAGNOSIS — I4819 Other persistent atrial fibrillation: Secondary | ICD-10-CM

## 2024-05-31 DIAGNOSIS — G4733 Obstructive sleep apnea (adult) (pediatric): Secondary | ICD-10-CM | POA: Diagnosis not present

## 2024-05-31 DIAGNOSIS — D649 Anemia, unspecified: Secondary | ICD-10-CM | POA: Insufficient documentation

## 2024-05-31 DIAGNOSIS — R531 Weakness: Secondary | ICD-10-CM

## 2024-05-31 DIAGNOSIS — I480 Paroxysmal atrial fibrillation: Secondary | ICD-10-CM | POA: Diagnosis present

## 2024-05-31 DIAGNOSIS — N200 Calculus of kidney: Secondary | ICD-10-CM

## 2024-05-31 LAB — CK: Total CK: 59 U/L (ref 49–397)

## 2024-05-31 LAB — COMPREHENSIVE METABOLIC PANEL WITH GFR
ALT: 13 U/L (ref 0–44)
AST: 20 U/L (ref 15–41)
Albumin: 3 g/dL — ABNORMAL LOW (ref 3.5–5.0)
Alkaline Phosphatase: 38 U/L (ref 38–126)
Anion gap: 14 (ref 5–15)
BUN: 43 mg/dL — ABNORMAL HIGH (ref 8–23)
CO2: 23 mmol/L (ref 22–32)
Calcium: 9 mg/dL (ref 8.9–10.3)
Chloride: 101 mmol/L (ref 98–111)
Creatinine, Ser: 3.1 mg/dL — ABNORMAL HIGH (ref 0.61–1.24)
GFR, Estimated: 20 mL/min — ABNORMAL LOW
Glucose, Bld: 94 mg/dL (ref 70–99)
Potassium: 3.6 mmol/L (ref 3.5–5.1)
Sodium: 138 mmol/L (ref 135–145)
Total Bilirubin: 0.5 mg/dL (ref 0.0–1.2)
Total Protein: 6.2 g/dL — ABNORMAL LOW (ref 6.5–8.1)

## 2024-05-31 LAB — CBC WITH DIFFERENTIAL/PLATELET
Abs Immature Granulocytes: 0.03 K/uL (ref 0.00–0.07)
Basophils Absolute: 0 K/uL (ref 0.0–0.1)
Basophils Relative: 0 %
Eosinophils Absolute: 0.3 K/uL (ref 0.0–0.5)
Eosinophils Relative: 5 %
HCT: 35.1 % — ABNORMAL LOW (ref 39.0–52.0)
Hemoglobin: 11.9 g/dL — ABNORMAL LOW (ref 13.0–17.0)
Immature Granulocytes: 0 %
Lymphocytes Relative: 21 %
Lymphs Abs: 1.5 K/uL (ref 0.7–4.0)
MCH: 30.2 pg (ref 26.0–34.0)
MCHC: 33.9 g/dL (ref 30.0–36.0)
MCV: 89.1 fL (ref 80.0–100.0)
Monocytes Absolute: 0.6 K/uL (ref 0.1–1.0)
Monocytes Relative: 8 %
Neutro Abs: 4.6 K/uL (ref 1.7–7.7)
Neutrophils Relative %: 66 %
Platelets: 262 K/uL (ref 150–400)
RBC: 3.94 MIL/uL — ABNORMAL LOW (ref 4.22–5.81)
RDW: 14.1 % (ref 11.5–15.5)
WBC: 7.1 K/uL (ref 4.0–10.5)
nRBC: 0 % (ref 0.0–0.2)

## 2024-05-31 LAB — I-STAT CHEM 8, ED
BUN: 41 mg/dL — ABNORMAL HIGH (ref 8–23)
Calcium, Ion: 1.18 mmol/L (ref 1.15–1.40)
Chloride: 100 mmol/L (ref 98–111)
Creatinine, Ser: 3.3 mg/dL — ABNORMAL HIGH (ref 0.61–1.24)
Glucose, Bld: 91 mg/dL (ref 70–99)
HCT: 35 % — ABNORMAL LOW (ref 39.0–52.0)
Hemoglobin: 11.9 g/dL — ABNORMAL LOW (ref 13.0–17.0)
Potassium: 3.6 mmol/L (ref 3.5–5.1)
Sodium: 139 mmol/L (ref 135–145)
TCO2: 23 mmol/L (ref 22–32)

## 2024-05-31 LAB — LIPASE, BLOOD: Lipase: 41 U/L (ref 11–51)

## 2024-05-31 LAB — URINALYSIS, W/ REFLEX TO CULTURE (INFECTION SUSPECTED)
Bacteria, UA: NONE SEEN
Bilirubin Urine: NEGATIVE
Glucose, UA: >=500 mg/dL — AB
Hgb urine dipstick: NEGATIVE
Ketones, ur: NEGATIVE mg/dL
Leukocytes,Ua: NEGATIVE
Nitrite: NEGATIVE
Protein, ur: NEGATIVE mg/dL
Specific Gravity, Urine: 1.007 (ref 1.005–1.030)
pH: 6 (ref 5.0–8.0)

## 2024-05-31 LAB — TROPONIN I (HIGH SENSITIVITY)
Troponin I (High Sensitivity): 17 ng/L
Troponin I (High Sensitivity): 17 ng/L (ref <18)

## 2024-05-31 LAB — BRAIN NATRIURETIC PEPTIDE: B Natriuretic Peptide: 1207.5 pg/mL — ABNORMAL HIGH (ref 0.0–100.0)

## 2024-05-31 LAB — MAGNESIUM: Magnesium: 1.8 mg/dL (ref 1.7–2.4)

## 2024-05-31 MED ORDER — SODIUM CHLORIDE 0.9 % IV SOLN
500.0000 mg | INTRAVENOUS | Status: DC
  Administered 2024-06-01: 500 mg via INTRAVENOUS
  Filled 2024-05-31 (×2): qty 5

## 2024-05-31 MED ORDER — ONDANSETRON HCL 4 MG/2ML IJ SOLN
4.0000 mg | Freq: Four times a day (QID) | INTRAMUSCULAR | Status: DC | PRN
  Administered 2024-06-01 – 2024-06-02 (×3): 4 mg via INTRAVENOUS
  Filled 2024-05-31 (×3): qty 2

## 2024-05-31 MED ORDER — CLOPIDOGREL BISULFATE 75 MG PO TABS
75.0000 mg | ORAL_TABLET | Freq: Every day | ORAL | Status: DC
  Administered 2024-06-01 – 2024-06-04 (×4): 75 mg via ORAL
  Filled 2024-05-31 (×4): qty 1

## 2024-05-31 MED ORDER — ONDANSETRON HCL 4 MG PO TABS: 4.0000 mg | ORAL_TABLET | Freq: Four times a day (QID) | ORAL | Status: DC | PRN

## 2024-05-31 MED ORDER — INSULIN ASPART 100 UNIT/ML IJ SOLN
0.0000 [IU] | Freq: Every day | INTRAMUSCULAR | Status: DC
  Filled 2024-05-31 (×2): qty 1

## 2024-05-31 MED ORDER — ACETAMINOPHEN 500 MG PO TABS
500.0000 mg | ORAL_TABLET | Freq: Four times a day (QID) | ORAL | Status: DC | PRN
  Administered 2024-06-02 – 2024-06-05 (×2): 500 mg via ORAL
  Filled 2024-05-31 (×2): qty 1

## 2024-05-31 MED ORDER — FENOFIBRATE 54 MG PO TABS
54.0000 mg | ORAL_TABLET | Freq: Every day | ORAL | Status: DC
  Administered 2024-06-01 – 2024-06-06 (×6): 54 mg via ORAL
  Filled 2024-05-31 (×6): qty 1

## 2024-05-31 MED ORDER — AMIODARONE HCL 200 MG PO TABS: 200.0000 mg | ORAL_TABLET | Freq: Every day | ORAL | Status: DC

## 2024-05-31 MED ORDER — TAMSULOSIN HCL 0.4 MG PO CAPS
0.4000 mg | ORAL_CAPSULE | Freq: Every day | ORAL | Status: DC
  Administered 2024-06-01 – 2024-06-08 (×8): 0.4 mg via ORAL
  Filled 2024-05-31 (×8): qty 1

## 2024-05-31 MED ORDER — SODIUM CHLORIDE 0.9 % IV SOLN: INTRAVENOUS | Status: AC

## 2024-05-31 MED ORDER — LACTATED RINGERS IV BOLUS
1000.0000 mL | Freq: Once | INTRAVENOUS | Status: AC
  Administered 2024-05-31: 1000 mL via INTRAVENOUS

## 2024-05-31 MED ORDER — SODIUM CHLORIDE 0.9 % IV SOLN
2.0000 g | INTRAVENOUS | Status: DC
  Administered 2024-06-01: 2 g via INTRAVENOUS
  Filled 2024-05-31: qty 20

## 2024-05-31 MED ORDER — INSULIN ASPART 100 UNIT/ML IJ SOLN
0.0000 [IU] | Freq: Three times a day (TID) | INTRAMUSCULAR | Status: DC
  Administered 2024-06-01 – 2024-06-02 (×2): 2 [IU] via SUBCUTANEOUS
  Administered 2024-06-03: 3 [IU] via SUBCUTANEOUS
  Administered 2024-06-03: 2 [IU] via SUBCUTANEOUS
  Administered 2024-06-04: 3 [IU] via SUBCUTANEOUS
  Administered 2024-06-05 – 2024-06-08 (×4): 2 [IU] via SUBCUTANEOUS
  Filled 2024-05-31 (×4): qty 2
  Filled 2024-05-31: qty 3
  Filled 2024-05-31 (×4): qty 2

## 2024-05-31 MED ORDER — ATORVASTATIN CALCIUM 40 MG PO TABS
40.0000 mg | ORAL_TABLET | Freq: Every day | ORAL | Status: DC
  Administered 2024-06-01 – 2024-06-08 (×8): 40 mg via ORAL
  Filled 2024-05-31 (×8): qty 1

## 2024-05-31 MED ORDER — SODIUM CHLORIDE 0.9% FLUSH
3.0000 mL | Freq: Two times a day (BID) | INTRAVENOUS | Status: DC
  Administered 2024-06-01 – 2024-06-08 (×8): 3 mL via INTRAVENOUS

## 2024-05-31 MED ORDER — AMIODARONE HCL 200 MG PO TABS
200.0000 mg | ORAL_TABLET | Freq: Two times a day (BID) | ORAL | Status: DC
  Administered 2024-06-01 – 2024-06-08 (×14): 200 mg via ORAL
  Filled 2024-05-31 (×16): qty 1

## 2024-05-31 NOTE — Assessment & Plan Note (Signed)
 Managed with Entresto  and spironolactone  and farxiga  with EF 30-35% yesterday. Entresto  is temporarily held due to hypotension for 4-5 days. Resume Entresto  once blood pressure stabilizes and symptoms improve with eating and drinking normally again.

## 2024-05-31 NOTE — ED Provider Notes (Signed)
 [place] [facility] AT [facility] Provider Note   CSN: [id number] Arrival date & time: 05/31/24  1849     History Chief Complaint  Patient presents with   Abnormal Lab    HPI: Christopher [name] is a 73 y.o. male with history pertinent for hypertension, mitral regurgitation, hyperlipidemia, A-fib, CHF, CAD, T2DM, CKD 3 who presents complaining of abnormal labs. Patient arrived via POV accompanied by girlfriend.  History provided by patient and spouse/partner.  No interpreter required during this encounter.  Patient reports that over the past few weeks he has had early satiety, has had poor p.o. intake, and frequently has emesis after eating.  Reports that he has had overall malaise, fatigue, however no fever, chills, chest pain, shortness of breath, abdominal pain.  Denies diarrhea.  Reports that he is awaiting a follow-up with the A-fib clinic, and he saw his PCP earlier this week, and got a call today that he had abnormal labs and that he needed to come to the emergency department to get fluids.  Reports that he was told that his heart rate was low while in the triage area, however denies any current chest pain, shortness of breath, lightheadedness.  Patient's recorded medical, surgical, social, medication list and allergies were reviewed in the Snapshot window as part of the initial history.   Prior to Admission medications   Medication Sig Start Date End Date Taking? Authorizing Provider  Accu-Chek Softclix Lancets lancets 1 each by Other route See admin instructions. Use TID 11/30/23   [name], MD  acetaminophen  (TYLENOL ) 500 MG tablet Take 500 mg by mouth every 6 (six) hours as needed for mild pain (pain score 1-3) or headache.    [provider]  amiodarone  (PACERONE ) 200 MG tablet Take 1 tablet (200 mg total) by mouth 2 (two) times daily for 30 days, THEN 1 tablet (200 mg total) daily. 05/30/24 06/24/25  Camnitz, Soyla Lunger, MD   atorvastatin  (LIPITOR) 40 MG tablet Take 1 tablet (40 mg total) by mouth daily. 11/30/23   [name], MD  BD PEN NEEDLE SHORT ULTRAFINE 31G X 8 MM MISC USE TWICE DAILY FOR INSULIN   INJECTION 02/10/24   [name], MD  blood glucose meter kit and supplies KIT Dispense based on patient and insurance preference. Use up to four times daily as directed. 02/18/21   Regalado, Belkys A, MD  cetirizine (ZYRTEC) 10 MG tablet Take 10 mg by mouth daily.    [provider]  cholecalciferol  (VITAMIN D ) 25 MCG (1000 UNIT) tablet Take 1 tablet by mouth daily.     [provider]  Choline Fenofibrate  (FENOFIBRIC ACID ) 45 MG CPDR Take 1 tablet by mouth daily. 03/03/24   Hilty, Vinie BROCKS, MD  clopidogrel  (PLAVIX ) 75 MG tablet Take 1 tablet (75 mg total) by mouth daily. 11/30/23   [name], MD  dapagliflozin  propanediol (FARXIGA ) 10 MG TABS tablet Take 1 tablet (10 mg total) by mouth daily. 11/30/23   [name], MD  doxycycline  (VIBRA -TABS) 100 MG tablet Take 1 tablet (100 mg total) by mouth 2 (two) times daily. 05/30/24   [name], MD  fluticasone  (FLONASE ) 50 MCG/ACT nasal spray Place 2 sprays into both nostrils daily. 11/30/23   [name], MD  furosemide  (LASIX ) 80 MG tablet Take 1 tablet (80 mg total) by mouth daily. 03/31/24 06/29/24  Mona Vinie BROCKS, MD  glucose blood (ACCU-CHEK GUIDE TEST) test strip Use as instructed 11/30/23  [name], MD  Insulin  Lispro Prot & Lispro (HUMALOG  MIX 75/25 KWIKPEN) (75-25) 100 UNIT/ML Kwikpen Inject 18 Units into the skin in the morning and at bedtime. 11/30/23   [name], MD  metFORMIN  (GLUCOPHAGE ) 1000 MG tablet Take 1 tablet (1,000 mg total) by mouth 2 (two) times daily with a meal. 11/30/23   [name], MD  metoprolol  succinate (TOPROL -XL) 100 MG 24 hr tablet Take 1 tablet (100 mg total) by mouth daily. 11/30/23   [name], MD  mexiletine  (MEXITIL) 150 MG capsule Take 1 capsule (150 mg total) by mouth 2 (two) times daily. Patient taking differently: Take 150 mg by mouth 2 (two) times daily. 2 tablets 125mg  daily 03/31/24   Camnitz, Soyla Lunger, MD  OVER THE COUNTER MEDICATION 1 tablet in the morning and at bedtime. [facility] - Restore multivitamin    [provider]  sacubitril -valsartan  (ENTRESTO ) 97-103 MG TAKE 1 TABLET BY MOUTH TWICE  DAILY 05/23/24   Hilty, Vinie BROCKS, MD  spironolactone  (ALDACTONE ) 25 MG tablet TAKE 1 TABLET BY MOUTH DAILY 03/24/24   Hilty, Vinie BROCKS, MD  tamsulosin  (FLOMAX ) 0.4 MG CAPS capsule Take 1 capsule (0.4 mg total) by mouth daily. 11/30/23   [name], MD     Allergies: Codeine, Dust mite extract, and Pollen extract   Review of Systems   ROS as per HPI  Physical Exam Updated Vital Signs BP 117/80   Pulse 71   Temp 97.9 F (36.6 C) (Oral)   Resp 17   SpO2 96%  Physical Exam Vitals and nursing note reviewed.  Constitutional:      General: He is not in acute distress.    Appearance: He is well-developed.  HENT:     Head: Normocephalic and atraumatic.  Eyes:     Conjunctiva/sclera: Conjunctivae normal.  Cardiovascular:     Rate and Rhythm: Normal rate. Rhythm irregular.     Heart sounds: No murmur heard.    Comments: Non-perfusing PVCs on comparison of pulse rate to monitor at bedside Pulmonary:     Effort: Pulmonary effort is normal. No respiratory distress.     Breath sounds: Normal breath sounds.  Abdominal:     Palpations: Abdomen is soft.     Tenderness: There is no abdominal tenderness.  Musculoskeletal:        General: No swelling.     Cervical back: Neck supple.  Skin:    General: Skin is warm and dry.     Capillary Refill: Capillary refill takes less than 2 seconds.  Neurological:     Mental Status: He is alert.  Psychiatric:        Mood and Affect: Mood normal.     ED Course/ Medical Decision Making/ A&P   Procedures Procedures    Medications Ordered in ED Medications  acetaminophen  (TYLENOL ) tablet 500 mg (has no administration in time range)  amiodarone  (PACERONE ) tablet 200 mg (has no administration in time range)    Followed by  amiodarone  (PACERONE ) tablet 200 mg (has no administration in time range)  atorvastatin  (LIPITOR) tablet 40 mg (has no administration in time range)  Fenofibric Acid  CPDR 1 tablet (has no administration in time range)  tamsulosin  (FLOMAX ) capsule 0.4 mg (has no administration in time range)  clopidogrel  (PLAVIX ) tablet 75 mg (has no administration in time range)  cefTRIAXone  (ROCEPHIN ) 2 g in sodium chloride  0.9 % 100 mL IVPB (has no administration in time range)  azithromycin  (ZITHROMAX ) 500 mg  in sodium chloride  0.9 % 250 mL IVPB (has no administration in time range)  sodium chloride  flush (NS) 0.9 % injection 3 mL (has no administration in time range)  0.9 %  sodium chloride  infusion (has no administration in time range)  ondansetron  (ZOFRAN ) tablet 4 mg (has no administration in time range)    Or  ondansetron  (ZOFRAN ) injection 4 mg (has no administration in time range)  lactated ringers  bolus 1,000 mL (0 mLs Intravenous Stopped 05/31/24 2159)    Medical Decision Making:   QADIR FOLKS is a 73 y.o. male who presents for abnormal labs as per above.  Physical exam is pertinent for abnormal heart rate with non-perfusing PVCs.   The differential includes but is not limited to dehydration, pancreatitis, electrolyte derangement, metabolic derangement, symptomatic bradycardia, ACS.  Independent historian: Spouse/partner  External data reviewed: Labs: reviewed prior labs for baseline and Notes: Reviewed patient's recent PCP notes as well as outpatient labs,patient has baseline creatinine of 2-2.1, yesterday had AKI to 3.2 with symmetric elevation of BUN to 34.  Initial Plan:  Screening labs including CBC and Metabolic panel to evaluate for infectious or metabolic etiology of  disease.  BNP to assess volume status/heart failure exacerbation CK to evaluate for rhabdomyolysis Lipase to evaluate for pancreatitis Magnesium  to evaluate for Bolick derangement Urinalysis with reflex culture ordered to evaluate for UTI or relevant urologic/nephrologic pathology.  CT abdomen pelvis to evaluate for structural/infectious intra-abdominal pathology.  Chest x-ray to evaluate for structural/infectious intrathoracic pathology EKG and serial troponin to evaluate for cardiac pathology. Objective evaluation as below reviewed   Labs: Ordered, Independent interpretation, and Details: Initial troponin 17, delta 17.  CK WNL.  BNP elevated at greater than 1000.  UA without UTI.  CBC without leukocytosis, similar anemia on comparison to prior labs yesterday.  No thrombocytopenia.  CMP with AKI to 3 from baseline of 2.  Symmetric elevation of BUN.  No emergent electrolyte derangement or emergent LFT abnormality.  Magnesium  WNL.  Lipase WNL.  Radiology: Ordered, Independent interpretation, Details: Personally reviewed CT of the abdomen and pelvis, I do appreciate hydronephrosis of the right kidney with distal ureteral stone, I do not appreciate obstructive bowel gas pattern, free fluid, free air, or mass lesion.  Chest x-ray with mild costophrenic angle blunting bilaterally, no focal airspace opacification, cardiomediastinal silhouette gentian, pneumothorax., and All images reviewed independently.  Agree with radiology report at this time.   DG Chest 2 View Result Date: 05/31/2024 EXAM: 2 VIEW(S) XRAY OF THE CHEST 05/30/2024 03:30:37 PM COMPARISON: 08/10/2023 CLINICAL HISTORY: cough 2-3 weeks FINDINGS: LINES, TUBES AND DEVICES: Cardiac pacemaker. LUNGS AND PLEURA: Shallow inspiration. Perihilar infiltration, likely edema. Small bilateral pleural effusions. Appearances are similar to the previous study, although effusions are smaller. No pneumothorax. HEART AND MEDIASTINUM: Cardiac enlargement. BONES  AND SOFT TISSUES: No acute osseous abnormality. IMPRESSION: 1. Cardiomegaly with perihilar opacities most consistent with edema, with small bilateral pleural effusions, overall similar to the prior study though the effusions are smaller. Electronically signed by: Elsie Gravely MD 05/31/2024 10:44 PM EST RP Workstation: [id number]   CT ABDOMEN PELVIS WO CONTRAST Result Date: 05/31/2024 EXAM: CT ABDOMEN AND PELVIS WITHOUT CONTRAST 05/31/2024 10:34:00 PM TECHNIQUE: CT of the abdomen and pelvis was performed without the administration of intravenous contrast. Multiplanar reformatted images are provided for review. Automated exposure control, iterative reconstruction, and/or weight-based adjustment of the mA/kV was utilized to reduce the radiation dose to as low as reasonably achievable. COMPARISON: Abdominal radiograph 05/05/2017 and CT abdomen  and pelvis 01/04/2006. CLINICAL HISTORY: Abdominal pain, acute, nonlocalized; P.o. intolerance. FINDINGS: LOWER CHEST: Small bilateral pleural effusions, greater on the right. Diffuse interstitial reticular nodular and alveolar infiltrates in the lung bases. This probably represents edema, possibly with underlying chronic interstitial lung disease. Pneumonia or aspiration could also have this pattern. Cardiac enlargement. LIVER: The liver is unremarkable. GALLBLADDER AND BILE DUCTS: Gallbladder is unremarkable. No biliary ductal dilatation. SPLEEN: No acute abnormality. PANCREAS: No acute abnormality. ADRENAL GLANDS: No acute abnormality. KIDNEYS, URETERS AND BLADDER: Right kidney: 9 mm stone in the mid right ureter at the level of L4 with prominent hydronephrosis and hydroureter and stranding around the right kidney. Distal right ureter is decompressed. Left kidney: 3.3 cm cyst on the left kidney. No imaging follow-up is indicated. Per consensus, no follow-up is needed for simple Bosniak type 1 and 2 renal cysts, unless the patient has a malignancy history or risk factors.  Left kidney and left ureter are otherwise unremarkable. Urinary bladder is unremarkable. GI AND BOWEL: The stomach, small bowel, and colon are not abnormally distended. No wall thickening or inflammatory stranding is appreciated. The appendix is normal. There is no bowel obstruction. PERITONEUM AND RETROPERITONEUM: No ascites. No free air. VASCULATURE: Calcification of the aorta. Aorta is normal in caliber. No aneurysm. LYMPH NODES: No lymphadenopathy. REPRODUCTIVE ORGANS: The prostate gland is mildly enlarged. BONES AND SOFT TISSUES: Degenerative changes in the spine. No acute osseous abnormality. No focal soft tissue abnormality. IMPRESSION: 1. 9 mm stone in the mid right ureter at the level of L4 with prominent hydronephrosis, hydroureter, and perinephric stranding. Distal ureter is decompressed. 2. Small bilateral pleural effusions, greater on the right, with diffuse interstitial, reticulonodular, and alveolar infiltrates in the lung bases, which may reflect edema, chronic interstitial lung disease, pneumonia, or aspiration. 3. Cardiac enlargement. Electronically signed by: Elsie Gravely MD 05/31/2024 10:43 PM EST RP Workstation: [id number]   EKG/Medicine tests: Ordered and Independent interpretation EKG Interpretation:   Atrial fibrillation Right bundle branch block Inferior infarct, old  Premature ventricular complexes Confirmed by Rogelia Satterfield ([id number]) on 05/31/2024 11:33:16 PM                Interventions: LR bolus  See the EMR for full details regarding lab and imaging results.  Patient presents for AKI demonstrated on outpatient labs.  Patient also reportedly had bradycardia while in the triage area, however does not have specific cardiac symptoms such as chest pain, lightheadedness.  Patient does have fatigue.  Has had p.o. intake and p.o. intolerance for several weeks per patient and girlfriend.  Do feel that patient warrants broad labs and CT of the abdomen pelvis as per above.   Suspect prerenal AKI in the setting of poor oral intake and increased volume losses given clinical history, therefore LR bolus given.  Labs obtained, no evidence of ACS, rhabdomyolysis, UTI, ACS, pancreatitis.  Patient does have elevation of BNP, however no significant lower extremity edema, no oxygen requirement, therefore doubt acute heart failure exacerbation.  Patient redemonstrates AKI on CKD.  CT does demonstrate right-sided hydronephrosis and mid ureteral stone, however patient's symptoms are not consistent with kidney stone, on reevaluation, patient does not have CVA tenderness on the right, nor the left.  Given kidney stones can lead to a obstructive AKI, I did consult urology and spoke with Dr. Norva, who feels that if this is less likely a postobstructive AKI in the absence of acute symptoms of nephrolithiasis.  Recommends treatment as a prerenal AKI, and for medicine to  reengage urology if patient does not have improvement with treatment for prerenal AKI.  Consulted hospitalist, discussed with Dr. Vernon, who accepted the patient to his service.  Presentation is most consistent with acute complicated illness and Current presentation is complicated by underlying chronic conditions  Discussion of management or test interpretations with external provider(s): Dr. Norva, urology, Dr. Vernon, hospitalists  Risk Drugs:Prescription drug management Treatment: Decision regarding hospitalization  Disposition: ADMIT: I believe the patient requires admission for further care and management. The patient was admitted to hospitalists. Please see inpatient provider note for additional treatment plan details.   MDM generated using voice dictation software and may contain dictation errors.  Please contact me for any clarification or with any questions.  Clinical Impression:  1. AKI (acute kidney injury)   2. Nephrolithiasis      Admit   Final Clinical Impression(s) / ED Diagnoses Final diagnoses:   AKI (acute kidney injury)  Nephrolithiasis    Rx / DC Orders ED Discharge Orders     None        Rogelia Jerilynn RAMAN, MD 05/31/24 2352

## 2024-05-31 NOTE — Assessment & Plan Note (Signed)
 Given poor oral intake over 2-3 weeks needs CMP today to assess current renal function. Last GFR 33 and has been stable in 3b range.

## 2024-05-31 NOTE — Assessment & Plan Note (Signed)
 Likely secondary to decreased oral intake and possible pneumonia. Blood pressure management is complicated by heart failure and atrial fibrillation. Hold Entresto  for 3-4 days to allow blood pressure to stabilize. Monitor blood pressure and symptoms closely.

## 2024-05-31 NOTE — Assessment & Plan Note (Signed)
 Patient was told he was in a fib yesterday and has upcoming visit with a fib clinic. Rate controlled today with irreg irreg.

## 2024-05-31 NOTE — ED Notes (Signed)
 Pt ambulated to restroom without incident.

## 2024-05-31 NOTE — Assessment & Plan Note (Signed)
 Sugars are stable with morning sugars around 100 recently. He is maintaining oral intake without much food intake for 2-3 weeks. Monitor closely.

## 2024-05-31 NOTE — H&P (Addendum)
 History and Physical    Christopher [name] FMW:[id number] DOB: [birth year] DOA: 05/31/2024  PCP: [name], MD  Patient coming from: Home  I have personally briefly reviewed patient's old medical records in [facility]  Chief Complaint: Generalized weakness, decreased appetite, cough, nausea, vomiting  HPI: Christopher [name] is a 73 y.o. male with medical history significant of CAD s/p PCI to RCA 2014, SBE, severe MR s/p repair 2014, cardiac arrest s/p Medtronic single chamber ICD 2018, NICM, postop afib, HTN, HLD, CVA and OSA, ventricular fibrillation was sent to the emergency department after abnormal labs were found at PCPs office.  According to patient, he has been feeling poorly for last 2 to 3 weeks mainly what he thought was the symptoms were more like flulike but he did have a head any fever or chills.  His main symptoms were generalized weakness, decreased appetite, nausea and vomiting as well as nonproductive cough.  He had echo done just 2 days ago and was found to have ejection fraction of 30 to 35% with left ventricular global hypokinesis.  Due to those symptoms, he went to see his PCP just yesterday.  Basic labs were done.  He tells me that he received a phone call from his PCPs office today that his kidney function is down and that he needs to come to the emergency department for IV fluids.  Patient denies having fever, chills, sweating, abdominal or flank pain, any problem with urination but he does endorse having some cough nonproductive as well as intermittent exertional shortness of breath.  No recent travel or sick contact.  ED Course: Upon arrival to ED, fairly hemodynamically stable, 1 episode of sinus bradycardia and tachypnea.  Creatinine was 3.1 which was 3.2 yesterday at PCPs office.  Hypoalbuminemia.  CBC shows mild anemia but no leukocytosis.  Troponin x 2 negative.  Lipase unremarkable.  BNP 07/07/2005.  Chest x-ray with [place] interstitial opacities throughout  lungs with interstitial edema and cardiomegaly.  CT chest and abdomen showed incidental 9 mm stone in the right mid ureter with hydronephrosis and hydroureter and perinephric stranding and possible infiltrates at the lung bases which may be edema or pneumonia.  UA unremarkable.  EDP discussed with urology.  Details below.  Hospitalist was consulted for admission.  Review of Systems: As per HPI otherwise negative.    Past Medical History:  Diagnosis Date   Arthritis    Atrial fibrillation (HCC)    post op, intol of anticoag   Cardiac arrest [facility])    a. s/p MDT single chamber ICD; 11-10-18- pt denies having a heart attack   Cataract    CHF (congestive heart failure) (HCC)    Chronic kidney disease    kidney stone   Coronary artery disease    a. 2/7 Cath: LM nl, LAD min irregs, LCX min irregs, RI 40, RCA 53m, EF 55-60% basal to mid inf HK, 3-4+ MR;  b. 08/25/2012 PCI of RCA with 4.0x15 Vision BMS   Diabetes mellitus without complication (HCC)    GERD (gastroesophageal reflux disease)    hx   Heart murmur    Hyperlipidemia    on statin   Hypertension    Myocardial infarction (HCC)    PONV (postoperative nausea and vomiting)    S/P mitral valve repair 09/27/2012   Complex valvuloplasty including triangular resection of flail posterior leaflet with 30 mm Sorin Memo 3D ring annuloplasty via right mini thoracotomy approach   seasonal allergies 09/27/2008   Severe  mitral regurgitation    a. Mitral valve prolapse with flail segment of posterior leaflet and severe MR by TEE, remote h/o bacterial endocarditis    Sleep apnea    NPSG 01/21/06- AHI 40.7/hr cpap   Stroke [facility])    summer 2022- one messed up vision, the other balance   Subacute bacterial endocarditis 03/22/2008   Strep viridans   Ventricular fibrillation (HCC) 11/07/2019   appropriate shock (36J) for VF delivered    Past Surgical History:  Procedure Laterality Date   AMPUTATION  07/28/2012   Procedure: AMPUTATION DIGIT;   Surgeon: Deward DELENA Schwartz, MD;  Location: [facility] ORS;  Service: Orthopedics;  Laterality: Right;  2nd toe   CARDIAC CATHETERIZATION     COLONOSCOPY     EP IMPLANTABLE DEVICE N/A 05/01/2016   Procedure: Loop Recorder Removal;  Surgeon: Lynwood Rakers, MD;  Location: [facility] INVASIVE CV LAB;  Service: Cardiovascular;  Laterality: N/A;   FOOT SURGERY     BILATERAL TOES   ICD IMPLANT N/A 05/07/2017    Medtronic Visia AF MRI VR SureScan implanted by Dr Inocencio following VF arrest   Implantable loop recorder placement  03/31/2013   MDT Linq implanted by Dr Rakers to evaluate for further afib   INTRAOPERATIVE TRANSESOPHAGEAL ECHOCARDIOGRAM N/A 09/27/2012   Procedure: INTRAOPERATIVE TRANSESOPHAGEAL ECHOCARDIOGRAM;  Surgeon: Sudie VEAR Laine, MD;  Location: [facility] OR;  Service: Open Heart Surgery;  Laterality: N/A;   LEFT AND RIGHT HEART CATHETERIZATION WITH CORONARY ANGIOGRAM N/A 08/12/2012   Procedure: LEFT AND RIGHT HEART CATHETERIZATION WITH CORONARY ANGIOGRAM;  Surgeon: Ezra GORMAN Shuck, MD;  Location: [facility] CATH LAB;  Service: Cardiovascular;  Laterality: N/A;   LEFT HEART CATH AND CORONARY ANGIOGRAPHY N/A 05/06/2017   Procedure: LEFT HEART CATH AND CORONARY ANGIOGRAPHY;  Surgeon: Anner Alm ORN, MD;  Location: [facility] INVASIVE CV LAB;  Service: Cardiovascular;  Laterality: N/A;   LEFT HEART CATH AND CORONARY ANGIOGRAPHY N/A 08/12/2023   Procedure: LEFT HEART CATH AND CORONARY ANGIOGRAPHY;  Surgeon: Jordan, Peter M, MD;  Location: [facility] INVASIVE CV LAB;  Service: Cardiovascular;  Laterality: N/A;   LOOP RECORDER IMPLANT N/A 03/31/2013   Procedure: LOOP RECORDER IMPLANT;  Surgeon: Lynwood JONETTA Rakers, MD;  Location: [facility] CATH LAB;  Service: Cardiovascular;  Laterality: N/A;   MITRAL VALVE REPAIR Right 09/27/2012   Procedure: MINIMALLY INVASIVE MITRAL VALVE REPAIR (MVR);  Surgeon: Sudie VEAR Laine, MD;  Location: [facility] OR;  Service: Open Heart Surgery;  Laterality: Right;  Ultrasound guided   PERCUTANEOUS CORONARY STENT INTERVENTION  (PCI-S) N/A 08/25/2012   Procedure: PERCUTANEOUS CORONARY STENT INTERVENTION (PCI-S);  Surgeon: Ozell Fell, MD;  Location: [facility] CATH LAB;  Service: Cardiovascular;  Laterality: N/A;   POLYPECTOMY     SEPTOPLASTY     Dr. Floy   TEE WITHOUT CARDIOVERSION N/A 08/12/2012   Procedure: TRANSESOPHAGEAL ECHOCARDIOGRAM (TEE);  Surgeon: Ezra GORMAN Shuck, MD;  Location: [facility] ENDOSCOPY;  Service: Cardiovascular;  Laterality: N/A;   TONSILLECTOMY     UVULOPALATOPHARYNGOPLASTY       reports that he quit smoking about 45 years ago. His smoking use included cigarettes. He started smoking about 50 years ago. He has a 12.5 pack-year smoking history. He has never used smokeless tobacco. He reports current alcohol use. He reports that he does not use drugs.  Allergies  Allergen Reactions   Codeine Other (See Comments)    Causes bad constipation   Dust Mite Extract Cough   Pollen Extract Cough    Family History  Problem Relation Age of Onset  Diabetes Mother    Stroke Mother    Heart disease Father    Colon cancer Father 33   Diabetes Father    Renal cancer Father    Cancer Father    Sudden death Brother    Heart attack Brother    Esophageal cancer Neg Hx    Rectal cancer Neg Hx    Stomach cancer Neg Hx    Colon polyps Neg Hx     Prior to Admission medications   Medication Sig Start Date End Date Taking? Authorizing Provider  Accu-Chek Softclix Lancets lancets 1 each by Other route See admin instructions. Use TID 11/30/23   [name], MD  acetaminophen  (TYLENOL ) 500 MG tablet Take 500 mg by mouth every 6 (six) hours as needed for mild pain (pain score 1-3) or headache.    [provider]  amiodarone  (PACERONE ) 200 MG tablet Take 1 tablet (200 mg total) by mouth 2 (two) times daily for 30 days, THEN 1 tablet (200 mg total) daily. 05/30/24 06/24/25  Camnitz, Soyla Lunger, MD  atorvastatin  (LIPITOR) 40 MG tablet Take 1 tablet (40 mg total) by mouth daily. 11/30/23   [name], MD  BD PEN NEEDLE SHORT ULTRAFINE 31G X 8 MM MISC USE TWICE DAILY FOR INSULIN   INJECTION 02/10/24   [name], MD  blood glucose meter kit and supplies KIT Dispense based on patient and insurance preference. Use up to four times daily as directed. 02/18/21   Regalado, Belkys A, MD  cetirizine (ZYRTEC) 10 MG tablet Take 10 mg by mouth daily.    [provider]  cholecalciferol  (VITAMIN D ) 25 MCG (1000 UNIT) tablet Take 1 tablet by mouth daily.     [provider]  Choline Fenofibrate  (FENOFIBRIC ACID ) 45 MG CPDR Take 1 tablet by mouth daily. 03/03/24   Hilty, Vinie BROCKS, MD  clopidogrel  (PLAVIX ) 75 MG tablet Take 1 tablet (75 mg total) by mouth daily. 11/30/23   [name], MD  dapagliflozin  propanediol (FARXIGA ) 10 MG TABS tablet Take 1 tablet (10 mg total) by mouth daily. 11/30/23   [name], MD  doxycycline  (VIBRA -TABS) 100 MG tablet Take 1 tablet (100 mg total) by mouth 2 (two) times daily. 05/30/24   [name], MD  fluticasone  (FLONASE ) 50 MCG/ACT nasal spray Place 2 sprays into both nostrils daily. 11/30/23   [name], MD  furosemide  (LASIX ) 80 MG tablet Take 1 tablet (80 mg total) by mouth daily. 03/31/24 06/29/24  Mona Vinie BROCKS, MD  glucose blood (ACCU-CHEK GUIDE TEST) test strip Use as instructed 11/30/23   [name], MD  Insulin  Lispro Prot & Lispro (HUMALOG  MIX 75/25 KWIKPEN) (75-25) 100 UNIT/ML Kwikpen Inject 18 Units into the skin in the morning and at bedtime. 11/30/23   [name], MD  metFORMIN  (GLUCOPHAGE ) 1000 MG tablet Take 1 tablet (1,000 mg total) by mouth 2 (two) times daily with a meal. 11/30/23   [name], MD  metoprolol  succinate (TOPROL -XL) 100 MG 24 hr tablet Take 1 tablet (100 mg total) by mouth daily. 11/30/23   [name], MD  mexiletine (MEXITIL) 150 MG capsule Take 1 capsule (150 mg total) by mouth 2 (two) times daily. Patient taking  differently: Take 150 mg by mouth 2 (two) times daily. 2 tablets 125mg  daily 03/31/24   Camnitz, Soyla Lunger, MD  OVER THE COUNTER MEDICATION 1 tablet in the morning and at bedtime. [facility] - Restore multivitamin  [provider]  sacubitril -valsartan  (ENTRESTO ) 97-103 MG TAKE 1 TABLET BY MOUTH TWICE  DAILY 05/23/24   Hilty, Vinie BROCKS, MD  spironolactone  (ALDACTONE ) 25 MG tablet TAKE 1 TABLET BY MOUTH DAILY 03/24/24   Hilty, Vinie BROCKS, MD  tamsulosin  (FLOMAX ) 0.4 MG CAPS capsule Take 1 capsule (0.4 mg total) by mouth daily. 11/30/23   [name], MD    Physical Exam: Vitals:   05/31/24 2030 05/31/24 2200 05/31/24 2245 05/31/24 2317  BP: 121/69 127/81 117/80   Pulse: 83 66 71   Resp: 17 (!) 24 17   Temp:    97.9 F (36.6 C)  TempSrc:    Oral  SpO2: 98% 93% 96%     Constitutional: NAD, calm, comfortable Vitals:   05/31/24 2030 05/31/24 2200 05/31/24 2245 05/31/24 2317  BP: 121/69 127/81 117/80   Pulse: 83 66 71   Resp: 17 (!) 24 17   Temp:    97.9 F (36.6 C)  TempSrc:    Oral  SpO2: 98% 93% 96%    Eyes: PERRL, lids and conjunctivae normal ENMT: Mucous membranes are very dry.  Posterior pharynx clear of any exudate or lesions.Normal dentition.  Neck: normal, supple, no masses, no thyromegaly Respiratory: Bilateral rales and rhonchi.  No wheezes.  Normal respiratory effort. No accessory muscle use.  Cardiovascular: Regular rate and rhythm, no murmurs / rubs / gallops. No extremity edema. 2+ pedal pulses. No carotid bruits.  Abdomen: no tenderness, no masses palpated. No hepatosplenomegaly. Bowel sounds positive.  Musculoskeletal: no clubbing / cyanosis. No joint deformity upper and lower extremities. Good ROM, no contractures. Normal muscle tone.  Skin: no rashes, lesions, ulcers. No induration Neurologic: CN 2-12 grossly intact. Sensation intact, DTR normal. Strength 5/5 in all 4.  Psychiatric: Normal judgment and insight. Alert and oriented x 3. Normal  mood.    Labs on Admission: I have personally reviewed following labs and imaging studies  CBC: Recent Labs  Lab 05/30/24 1531 05/31/24 2019 05/31/24 2029  WBC 6.8 7.1  --   NEUTROABS  --  4.6  --   HGB 12.4* 11.9* 11.9*  HCT 36.8* 35.1* 35.0*  MCV 89.0 89.1  --   PLT 281.0 262  --    Basic Metabolic Panel: Recent Labs  Lab 05/30/24 1531 05/31/24 2019 05/31/24 2029  NA 137 138 139  K 3.9 3.6 3.6  CL 99 101 100  CO2 31 23  --   GLUCOSE 106* 94 91  BUN 48* 43* 41*  CREATININE 3.22* 3.10* 3.30*  CALCIUM  9.5 9.0  --   MG  --  1.8  --    GFR: Estimated Creatinine Clearance: 23.2 mL/min (A) (by C-G formula based on SCr of 3.3 mg/dL (H)). Liver Function Tests: Recent Labs  Lab 05/30/24 1531 05/31/24 2019  AST 18 20  ALT 11 13  ALKPHOS 34* 38  BILITOT 0.6 0.5  PROT 6.6 6.2*  ALBUMIN  3.7 3.0*   Recent Labs  Lab 05/31/24 2019  LIPASE 41   No results for input(s): AMMONIA in the last 168 hours. Coagulation Profile: No results for input(s): INR, PROTIME in the last 168 hours. Cardiac Enzymes: Recent Labs  Lab 05/31/24 2019  CKTOTAL 59   BNP (last 3 results) No results for input(s): PROBNP in the last 8760 hours. HbA1C: No results for input(s): HGBA1C in the last 72 hours. CBG: No results for input(s): GLUCAP in the last 168 hours. Lipid Profile: No results for input(s): CHOL, HDL, LDLCALC, TRIG,  CHOLHDL, LDLDIRECT in the last 72 hours. Thyroid  Function Tests: No results for input(s): TSH, T4TOTAL, FREET4, T3FREE, THYROIDAB in the last 72 hours. Anemia Panel: No results for input(s): VITAMINB12, FOLATE, FERRITIN, TIBC, IRON, RETICCTPCT in the last 72 hours. Urine analysis:    Component Value Date/Time   COLORURINE YELLOW 05/31/2024 2203   APPEARANCEUR CLEAR 05/31/2024 2203   LABSPEC 1.007 05/31/2024 2203   PHURINE 6.0 05/31/2024 2203   GLUCOSEU >=500 (A) 05/31/2024 2203   HGBUR NEGATIVE 05/31/2024 2203    BILIRUBINUR NEGATIVE 05/31/2024 2203   BILIRUBINUR small 05/03/2013 0845   KETONESUR NEGATIVE 05/31/2024 2203   PROTEINUR NEGATIVE 05/31/2024 2203   UROBILINOGEN 0.2 05/03/2013 0845   UROBILINOGEN 1.0 09/23/2012 1515   NITRITE NEGATIVE 05/31/2024 2203   LEUKOCYTESUR NEGATIVE 05/31/2024 2203    Radiological Exams on Admission: DG Chest 2 View Result Date: 05/31/2024 EXAM: 2 VIEW(S) XRAY OF THE CHEST 05/30/2024 03:30:37 PM COMPARISON: 08/10/2023 CLINICAL HISTORY: cough 2-3 weeks FINDINGS: LINES, TUBES AND DEVICES: Cardiac pacemaker. LUNGS AND PLEURA: Shallow inspiration. Perihilar infiltration, likely edema. Small bilateral pleural effusions. Appearances are similar to the previous study, although effusions are smaller. No pneumothorax. HEART AND MEDIASTINUM: Cardiac enlargement. BONES AND SOFT TISSUES: No acute osseous abnormality. IMPRESSION: 1. Cardiomegaly with perihilar opacities most consistent with edema, with small bilateral pleural effusions, overall similar to the prior study though the effusions are smaller. Electronically signed by: Elsie Gravely MD 05/31/2024 10:44 PM EST RP Workstation: [id number]   CT ABDOMEN PELVIS WO CONTRAST Result Date: 05/31/2024 EXAM: CT ABDOMEN AND PELVIS WITHOUT CONTRAST 05/31/2024 10:34:00 PM TECHNIQUE: CT of the abdomen and pelvis was performed without the administration of intravenous contrast. Multiplanar reformatted images are provided for review. Automated exposure control, iterative reconstruction, and/or weight-based adjustment of the mA/kV was utilized to reduce the radiation dose to as low as reasonably achievable. COMPARISON: Abdominal radiograph 05/05/2017 and CT abdomen and pelvis 01/04/2006. CLINICAL HISTORY: Abdominal pain, acute, nonlocalized; P.o. intolerance. FINDINGS: LOWER CHEST: Small bilateral pleural effusions, greater on the right. Diffuse interstitial reticular nodular and alveolar infiltrates in the lung bases. This probably  represents edema, possibly with underlying chronic interstitial lung disease. Pneumonia or aspiration could also have this pattern. Cardiac enlargement. LIVER: The liver is unremarkable. GALLBLADDER AND BILE DUCTS: Gallbladder is unremarkable. No biliary ductal dilatation. SPLEEN: No acute abnormality. PANCREAS: No acute abnormality. ADRENAL GLANDS: No acute abnormality. KIDNEYS, URETERS AND BLADDER: Right kidney: 9 mm stone in the mid right ureter at the level of L4 with prominent hydronephrosis and hydroureter and stranding around the right kidney. Distal right ureter is decompressed. Left kidney: 3.3 cm cyst on the left kidney. No imaging follow-up is indicated. Per consensus, no follow-up is needed for simple Bosniak type 1 and 2 renal cysts, unless the patient has a malignancy history or risk factors. Left kidney and left ureter are otherwise unremarkable. Urinary bladder is unremarkable. GI AND BOWEL: The stomach, small bowel, and colon are not abnormally distended. No wall thickening or inflammatory stranding is appreciated. The appendix is normal. There is no bowel obstruction. PERITONEUM AND RETROPERITONEUM: No ascites. No free air. VASCULATURE: Calcification of the aorta. Aorta is normal in caliber. No aneurysm. LYMPH NODES: No lymphadenopathy. REPRODUCTIVE ORGANS: The prostate gland is mildly enlarged. BONES AND SOFT TISSUES: Degenerative changes in the spine. No acute osseous abnormality. No focal soft tissue abnormality. IMPRESSION: 1. 9 mm stone in the mid right ureter at the level of L4 with prominent hydronephrosis, hydroureter, and perinephric stranding. Distal ureter is decompressed.  2. Small bilateral pleural effusions, greater on the right, with diffuse interstitial, reticulonodular, and alveolar infiltrates in the lung bases, which may reflect edema, chronic interstitial lung disease, pneumonia, or aspiration. 3. Cardiac enlargement. Electronically signed by: Elsie Gravely MD 05/31/2024 10:43  PM EST RP Workstation: [id number]   DG Chest 2 View Result Date: 05/31/2024 EXAM: 2 VIEW(S) XRAY OF THE CHEST 05/31/2024 08:17:00 PM COMPARISON: 05/30/2024 CLINICAL HISTORY: bradycardia bradycardia FINDINGS: LINES, TUBES AND DEVICES: Left AICD remains in place, unchanged. LUNGS AND PLEURA: Worsening interstitial opacities throughout the lungs, likely interstitial edema. No pleural effusion. No pneumothorax. HEART AND MEDIASTINUM: Cardiomegaly. Left AICD remains in place, unchanged. BONES AND SOFT TISSUES: No acute osseous abnormality. IMPRESSION: 1. Worsening interstitial opacities throughout the lungs, likely interstitial edema. 2. Cardiomegaly. Electronically signed by: Franky Crease MD 05/31/2024 08:21 PM EST RP Workstation: [id number]    EKG: Independently reviewed.  Atrial fibrillation and right bundle branch block  Assessment/Plan Principal Problem:   Acute on chronic kidney failure   AKI on CKD stage IIIb, POA: Patient's creatinine prior to February this year was around 1.4 however since May this year has been around 2.1, was 3.2 at clinic yesterday and 3.1 today.  This is likely in the setting of poor p.o. intake and nausea vomiting/ATN.  Patient received 1 L of IV fluid bolus.  Since patient has history of systolic congestive heart failure, I will start him on gentle hydration with 100 cc/h for next 20 hours and repeat labs in the morning.  Community-acquired pneumonia: Chest x-ray and CT chest abdomen pelvis concerning for possible pneumonia.  He was diagnosed with such at PCPs office yesterday and was started on doxycycline  after which patient has taken 2 doses.  He does endorse having intermittent exertional shortness of breath and nonproductive cough.  Will start him on Rocephin  and Zithromax , check urine antigen for Legionella and streptococci, sputum culture, blood culture, RSV flu and COVID.  He is not hypoxic.  Right ureteral stone with hydronephrosis and hydroureter, POA: This was  found incidentally.  UA unremarkable.  Patient has no abdominal pain and no CVA tenderness.  ED physician discussed with on-call urologist Dr. Maurilio Agar who opined that patient has asymptomatic and likely chronic stone which is not concerning and does not need any intervention and she also did not feel that patient's AKI is obstructive.  However recommended IV fluids and repeating labs in the morning and if no improvement, formally consulting urology.  Paroxysmal atrial fibrillation: Currently in A-fib but rates are controlled.  Resume amiodarone .  I am not sure why patient is not on any anticoagulation.  Patient could not provide me an answer to that question as well.  I personally reviewed several recent cardiology notes by Dr. Mona as well as Dr. Inocencio.  I could not find any documentation about anticoagulation.  Per discharge summary on 08/13/2023, patient is not on any anticoagulation but only on Plavix .  Monitor on telemetry.  Chronic systolic congestive heart failure/CAD status post PCI: Patient on Farxiga , Lasix , Toprol -XL Entresto , Aldactone  and recently started on mexiletine.  Due to elevated creatinine, holding all of them but resuming Toprol -XL.  Patient appears to be volume depleted.  Needs hydration.  Type 2 diabetes mellitus: Appears to be on Humalog  75/25 18 units twice daily as well as metformin  at home.  Interestingly, recent hemoglobin A1c was under 6.  I will hold long-acting insulin .  Only start on SSI for now.  Hyperlipidemia: Resume atorvastatin .  History of CVA: Resume Plavix .  BPH: Resume Flomax .  DVT prophylaxis: enoxaparin  (LOVENOX ) injection 30 mg Start: 06/01/24 1000Lovenox Code Status: Full code Family Communication: None present at bedside.  Plan of care discussed with patient in length and he verbalized understanding and agreed with it. Disposition Plan: Will likely require hospitalization for 2 to 3 days Consults called: None-May need to consult cardiology in the  morning on nonurgent basis.  Fredia Skeeter MD [facility]  *Please note that this is a verbal dictation therefore any spelling or grammatical errors are due to the Dragon Medical One system interpretation.  Please page via Amion and do not message via secure chat for urgent patient care matters. Secure chat can be used for non urgent patient care matters. 06/01/2024, 12:26 AM  To contact the attending provider between 7A-7P or the covering provider during after hours 7P-7A, please log into the web site [email or web address]

## 2024-05-31 NOTE — Assessment & Plan Note (Signed)
 He has experienced a persistent cough, nausea, decreased appetite, and weakness for 2-3 weeks. Crackles suggest possible pneumonia, with infection or pneumonia as differential diagnoses causing systemic symptoms. Ordered a chest x-ray to evaluate for pneumonia and started doxycycline  100 mg twice daily for 7 days due to high risk features.  Blood work performed today.

## 2024-06-01 ENCOUNTER — Encounter (HOSPITAL_COMMUNITY): Payer: Self-pay | Admitting: Family Medicine

## 2024-06-01 ENCOUNTER — Other Ambulatory Visit: Payer: Self-pay

## 2024-06-01 DIAGNOSIS — N1832 Chronic kidney disease, stage 3b: Secondary | ICD-10-CM | POA: Diagnosis not present

## 2024-06-01 DIAGNOSIS — N179 Acute kidney failure, unspecified: Secondary | ICD-10-CM | POA: Diagnosis not present

## 2024-06-01 LAB — BASIC METABOLIC PANEL WITH GFR
Anion gap: 11 (ref 5–15)
BUN: 40 mg/dL — ABNORMAL HIGH (ref 8–23)
CO2: 26 mmol/L (ref 22–32)
Calcium: 8.7 mg/dL — ABNORMAL LOW (ref 8.9–10.3)
Chloride: 102 mmol/L (ref 98–111)
Creatinine, Ser: 2.95 mg/dL — ABNORMAL HIGH (ref 0.61–1.24)
GFR, Estimated: 22 mL/min — ABNORMAL LOW (ref >60)
Glucose, Bld: 99 mg/dL (ref 70–99)
Potassium: 3.6 mmol/L (ref 3.5–5.1)
Sodium: 139 mmol/L (ref 135–145)

## 2024-06-01 LAB — CBC
HCT: 35 % — ABNORMAL LOW (ref 39.0–52.0)
Hemoglobin: 11.7 g/dL — ABNORMAL LOW (ref 13.0–17.0)
MCH: 29.8 pg (ref 26.0–34.0)
MCHC: 33.4 g/dL (ref 30.0–36.0)
MCV: 89.3 fL (ref 80.0–100.0)
Platelets: 257 K/uL (ref 150–400)
RBC: 3.92 MIL/uL — ABNORMAL LOW (ref 4.22–5.81)
RDW: 14.2 % (ref 11.5–15.5)
WBC: 8.1 K/uL (ref 4.0–10.5)
nRBC: 0 % (ref 0.0–0.2)

## 2024-06-01 LAB — GLUCOSE, CAPILLARY
Glucose-Capillary: 107 mg/dL — ABNORMAL HIGH (ref 70–99)
Glucose-Capillary: 111 mg/dL — ABNORMAL HIGH (ref 70–99)
Glucose-Capillary: 131 mg/dL — ABNORMAL HIGH (ref 70–99)
Glucose-Capillary: 80 mg/dL (ref 70–99)
Glucose-Capillary: 95 mg/dL (ref 70–99)

## 2024-06-01 LAB — MAGNESIUM: Magnesium: 1.7 mg/dL (ref 1.7–2.4)

## 2024-06-01 LAB — PROCALCITONIN: Procalcitonin: <0.1 ng/mL

## 2024-06-01 LAB — STREP PNEUMONIAE URINARY ANTIGEN: Strep Pneumo Urinary Antigen: NEGATIVE

## 2024-06-01 LAB — RESP PANEL BY RT-PCR (RSV, FLU A&B, COVID)  RVPGX2
Influenza A by PCR: NEGATIVE
Influenza B by PCR: NEGATIVE
Resp Syncytial Virus by PCR: NEGATIVE
SARS Coronavirus 2 by RT PCR: NEGATIVE

## 2024-06-01 MED ORDER — METOPROLOL SUCCINATE ER 100 MG PO TB24
100.0000 mg | ORAL_TABLET | Freq: Every day | ORAL | Status: DC
  Administered 2024-06-01 – 2024-06-08 (×7): 100 mg via ORAL
  Filled 2024-06-01 (×9): qty 1

## 2024-06-01 MED ORDER — ENOXAPARIN SODIUM 30 MG/0.3ML IJ SOSY
30.0000 mg | PREFILLED_SYRINGE | Freq: Every day | INTRAMUSCULAR | Status: DC
  Administered 2024-06-01 – 2024-06-04 (×4): 30 mg via SUBCUTANEOUS
  Filled 2024-06-01 (×4): qty 0.3

## 2024-06-01 MED ORDER — ORAL CARE MOUTH RINSE: 15.0000 mL | OROMUCOSAL | Status: DC | PRN

## 2024-06-01 NOTE — Hospital Course (Addendum)
 73 y.o. M with sCHF EF 30-35%, hx VF arrest s/p ICD, hx MV repair due to endocarditis, history MV repair due to SBE, AF not on AC due to thigh hematoma, HTN, CAD s/p PCI >25yrs, DM, OSA on CPAP, and obesity who presented with abnormal labs and generalized malaise.  Evidently feeling unwell for several weeks, malaise, fatigue, nausea and cough. Possibly since starting mexiletine.  Had an echo that showed EF stable at 30-35%.

## 2024-06-01 NOTE — Progress Notes (Signed)
  Progress Note   Patient: Christopher [name] FMW:[id number] DOB: 05/16/1951 DOA: 05/31/2024     1 DOS: the patient was seen and examined on 06/01/2024        Brief hospital course: 73 y.o. 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  Evidently feeling unwell for several weeks, malaise, fatigue, nausea and cough. Possibly since starting mexiletine.  Had an echo that showed EF stable at 30-35%.      Assessment and plan:  AKI on CKD IIIb Recent baseline 2.1.  Cr here 3.3 on admission, improved to 2.9 overnight with fluids  UA bland.  CT shows stone, but reviewed by Urology, who recommended against initial decompression and to trend Cr.  Cr improving to 2.9 overnight with fluids - Continue IV fluids - Avoid nephrotoxins - Hold Farxiga , Lasix , metformin , Entresto , spironolactone    Ureteral stone This was discussed with Dr. Norva in the ER who recommended follow up if Cr not improving.  Here, Cr appears better.  Infiltrates on CXR Unclear cause.  No hypoxia, no abnormal lungs sounds on exam. - Repeat CXR later  Chronic systolic CHF App;ears euvolemic - Hold diuretics Lasix  and spironolactone  - Hold Farxiga , Entresto  given AKI  Coronary disease Hypertension Blood pressure soft - Continue Plavix , fenofibrate  - Hold Lipitor, Farxiga , furosemide , metoprolol , Entresto , spironolactone   Diabetes Glucose well-controlled here - Hold metformin  and 75/25, and Farxiga  - Continue sliding scale corrections  OSA -CPAP at night  Class 1 obesity BMI 31, complicates care  Paroxysmal atrial fibrillation Rate controlled, not on anticoagulation due to previous intolerance - Continue amiodarone  - Hold metoprolol   Normocytic anemia Hemoglobin seems stable             Subjective: Patient still feels tired, generalized malaise, no  significant change from yesterday.  No fever, no respiratory symptoms, no cough, no orthopnea, no swelling.  No sputum.  No chest pain.  No flank pain.     Physical Exam: BP 107/81 (BP Location: Right Arm)   Pulse 95   Temp (!) 97.5 F (36.4 C)   Resp 18   Ht 5' 10 (1.778 m)   Wt 99.8 kg   SpO2 100%   BMI 31.57 kg/m   Adult male, lying in bed, slightly pale, no acute distress RRR, no murmurs, no peripheral edema Respiratory rate normal, lungs clear without rales or wheezes Abdomen soft, no tenderness palpation or guarding, no ascites or distention Attention normal, affect pleasant, judgment and insight appear normal, face symmetric, speech fluent     Data Reviewed: Basic metabolic panel shows creatinine down to 2.9 CBC shows mild anemia, no leukocytosis Chest x-ray, personally reviewed, shows poor penetration, small lung fields, slight opacities bilaterally     Family Communication:     Disposition: Status is: Inpatient         Author: Lonni SHAUNNA Dalton, MD 06/01/2024 11:49 AM  For on call review [email or web address].

## 2024-06-01 NOTE — Plan of Care (Signed)
 Ambulating well in room.   Problem: Education: Goal: Ability to describe self-care measures that may prevent or decrease complications (Diabetes Survival Skills Education) will improve Outcome: Progressing Goal: Individualized Educational Video(s) Outcome: Progressing   Problem: Coping: Goal: Ability to adjust to condition or change in health will improve Outcome: Progressing   Problem: Fluid Volume: Goal: Ability to maintain a balanced intake and output will improve Outcome: Progressing   Problem: Health Behavior/Discharge Planning: Goal: Ability to identify and utilize available resources and services will improve Outcome: Progressing Goal: Ability to manage health-related needs will improve Outcome: Progressing   Problem: Nutritional: Goal: Maintenance of adequate nutrition will improve Outcome: Progressing Goal: Progress toward achieving an optimal weight will improve Outcome: Progressing   Problem: Skin Integrity: Goal: Risk for impaired skin integrity will decrease Outcome: Progressing

## 2024-06-01 NOTE — Plan of Care (Signed)
   Problem: Education: Goal: Knowledge of General Education information will improve Description Including pain rating scale, medication(s)/side effects and non-pharmacologic comfort measures Outcome: Progressing

## 2024-06-01 NOTE — Progress Notes (Signed)
 New Admission Note:  Arrival Method: Wheelchair Mental Orientation: Alert and oriented x 4 Telemetry: Box 16  Assessment: Completed Skin: Warm and dry  IV: NSL Pain: Denies Tubes: N/A Safety Measures: Safety Fall Prevention Plan initiated.  Admission: Completed 5 M  Orientation: Patient has been orientated to the room, unit and the staff. Welcome booklet given.  Family: N/A  Orders have been reviewed and implemented. Will continue to monitor the patient. Call light has been placed within reach and bed alarm has been activated.   Durwood Dee BSN, RN  Phone Number: [phone]

## 2024-06-02 ENCOUNTER — Inpatient Hospital Stay (HOSPITAL_COMMUNITY)

## 2024-06-02 DIAGNOSIS — N1832 Chronic kidney disease, stage 3b: Secondary | ICD-10-CM | POA: Diagnosis not present

## 2024-06-02 DIAGNOSIS — N179 Acute kidney failure, unspecified: Secondary | ICD-10-CM | POA: Diagnosis not present

## 2024-06-02 LAB — COMPREHENSIVE METABOLIC PANEL WITH GFR
ALT: 11 U/L (ref 0–44)
AST: 18 U/L (ref 15–41)
Albumin: 2.7 g/dL — ABNORMAL LOW (ref 3.5–5.0)
Alkaline Phosphatase: 30 U/L — ABNORMAL LOW (ref 38–126)
Anion gap: 10 (ref 5–15)
BUN: 35 mg/dL — ABNORMAL HIGH (ref 8–23)
CO2: 24 mmol/L (ref 22–32)
Calcium: 8.3 mg/dL — ABNORMAL LOW (ref 8.9–10.3)
Chloride: 105 mmol/L (ref 98–111)
Creatinine, Ser: 2.83 mg/dL — ABNORMAL HIGH (ref 0.61–1.24)
GFR, Estimated: 23 mL/min — ABNORMAL LOW (ref >60)
Glucose, Bld: 130 mg/dL — ABNORMAL HIGH (ref 70–99)
Potassium: 3.6 mmol/L (ref 3.5–5.1)
Sodium: 139 mmol/L (ref 135–145)
Total Bilirubin: 0.8 mg/dL (ref 0.0–1.2)
Total Protein: 5.4 g/dL — ABNORMAL LOW (ref 6.5–8.1)

## 2024-06-02 LAB — CBC
HCT: 32.2 % — ABNORMAL LOW (ref 39.0–52.0)
Hemoglobin: 10.6 g/dL — ABNORMAL LOW (ref 13.0–17.0)
MCH: 29.9 pg (ref 26.0–34.0)
MCHC: 32.9 g/dL (ref 30.0–36.0)
MCV: 91 fL (ref 80.0–100.0)
Platelets: 200 K/uL (ref 150–400)
RBC: 3.54 MIL/uL — ABNORMAL LOW (ref 4.22–5.81)
RDW: 14.2 % (ref 11.5–15.5)
WBC: 6.7 K/uL (ref 4.0–10.5)
nRBC: 0 % (ref 0.0–0.2)

## 2024-06-02 LAB — GLUCOSE, CAPILLARY
Glucose-Capillary: 147 mg/dL — ABNORMAL HIGH (ref 70–99)
Glucose-Capillary: 103 mg/dL — ABNORMAL HIGH (ref 70–99)
Glucose-Capillary: 107 mg/dL — ABNORMAL HIGH (ref 70–99)
Glucose-Capillary: 149 mg/dL — ABNORMAL HIGH (ref 70–99)

## 2024-06-02 LAB — HEMOGLOBIN A1C
Hgb A1c MFr Bld: 5.9 % — ABNORMAL HIGH (ref 4.8–5.6)
Mean Plasma Glucose: 123 mg/dL

## 2024-06-02 MED ORDER — FUROSEMIDE 10 MG/ML IJ SOLN
40.0000 mg | Freq: Once | INTRAMUSCULAR | Status: AC
  Administered 2024-06-02: 40 mg via INTRAVENOUS
  Filled 2024-06-02: qty 4

## 2024-06-02 MED ORDER — CEFAZOLIN SODIUM-DEXTROSE 2-4 GM/100ML-% IV SOLN
2.0000 g | Freq: Once | INTRAVENOUS | Status: AC
  Administered 2024-06-03: 2 g via INTRAVENOUS

## 2024-06-02 NOTE — Plan of Care (Signed)

## 2024-06-02 NOTE — Progress Notes (Signed)
 PT Cancellation Note  Patient Details Name: Christopher [name] MRN: [id number] DOB: 01-30-1951   Cancelled Treatment:    Reason Eval/Treat Not Completed: Fatigue/lethargy limiting ability to participate. Pt reports ambulating in the hallway multiple times today, resulting in current fatigue. Pt also reports he is heading off the unit soon for a procedure. PT will follow up tomorrow.   Bernardino JINNY Ruth 06/02/2024, 5:20 PM

## 2024-06-02 NOTE — Progress Notes (Signed)
 RN messaged MD on call to get clarification on Lasix  order d/t MD notes saying to hold diuretics for another day.   Bari HERO Baylea Milburn

## 2024-06-02 NOTE — Consult Note (Signed)
 Subjective: 1. AKI (acute kidney injury)   2. Nephrolithiasis      Consult requested by Dr.  Lonni Dalton.   Christopher [name] is a 73 yo male who was admitted on 05/31/24 with early satiety, emesis, malaise and fatigue.  He was found to have a Cr of 3.22 on admission which was up from 2.04 in 9/25. An initial conversation with urology had the recommendation of hydration but his Creatinine has declined only slightly with medical management and Dr. Warner consulted regarding possible stenting.  His UA was negative.  He has no prior GU history in the documentation and is not a patient of our practice.    He has a history of BPH with BOO and is on tamsulosin .   ROS:  Review of Systems  Constitutional:  Positive for malaise/fatigue.  Respiratory:  Positive for cough.   Gastrointestinal:  Positive for nausea.  Genitourinary:  Negative for flank pain and hematuria.  All other systems reviewed and are negative.   Allergies  Allergen Reactions   Codeine Other (See Comments)    Causes bad constipation   Dust Mite Extract Cough   Pollen Extract Cough    Past Medical History:  Diagnosis Date   Arthritis    Atrial fibrillation (HCC)    post op, intol of anticoag   Cardiac arrest (HCC)    a. s/p MDT single chamber ICD; 11-10-18- pt denies having a heart attack   Cataract    CHF (congestive heart failure) (HCC)    Chronic kidney disease    kidney stone   Coronary artery disease    a. 2/7 Cath: LM nl, LAD min irregs, LCX min irregs, RI 40, RCA 63m, EF 55-60% basal to mid inf HK, 3-4+ MR;  b. 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/22/2008   Strep viridans   Ventricular fibrillation (HCC) 11/07/2019   appropriate shock (36J) for VF delivered    Past Surgical History:  Procedure Laterality Date   AMPUTATION  07/28/2012   Procedure: AMPUTATION DIGIT;  Surgeon: Deward DELENA Schwartz, MD;  Location: [facility] ORS;  Service: Orthopedics;  Laterality: Right;  2nd toe   CARDIAC CATHETERIZATION     COLONOSCOPY     EP IMPLANTABLE DEVICE N/A 05/01/2016   Procedure: Loop Recorder Removal;  Surgeon: Lynwood Rakers, MD;  Location: [facility] INVASIVE CV LAB;  Service: Cardiovascular;  Laterality: N/A;   FOOT SURGERY     BILATERAL TOES   ICD IMPLANT N/A 05/07/2017    Medtronic Visia AF MRI VR SureScan implanted by Dr Inocencio following VF arrest   Implantable loop recorder placement  03/31/2013   MDT Linq implanted  by Dr Kelsie to evaluate for further afib   INTRAOPERATIVE TRANSESOPHAGEAL ECHOCARDIOGRAM N/A 09/27/2012   Procedure: INTRAOPERATIVE TRANSESOPHAGEAL ECHOCARDIOGRAM;  Surgeon: Sudie VEAR Laine, MD;  Location: [facility] OR;  Service: Open Heart Surgery;  Laterality: N/A;   LEFT AND RIGHT HEART CATHETERIZATION WITH CORONARY ANGIOGRAM N/A 08/12/2012   Procedure: LEFT AND RIGHT HEART CATHETERIZATION WITH CORONARY ANGIOGRAM;  Surgeon: Ezra GORMAN Shuck, MD;  Location: [facility] CATH LAB;  Service: Cardiovascular;  Laterality: N/A;   LEFT HEART CATH AND CORONARY ANGIOGRAPHY N/A 05/06/2017   Procedure: LEFT HEART CATH AND CORONARY ANGIOGRAPHY;  Surgeon: Anner Alm ORN, MD;  Location: [facility] INVASIVE CV LAB;  Service: Cardiovascular;  Laterality: N/A;    LEFT HEART CATH AND CORONARY ANGIOGRAPHY N/A 08/12/2023   Procedure: LEFT HEART CATH AND CORONARY ANGIOGRAPHY;  Surgeon: Jordan, Peter M, MD;  Location: [facility] INVASIVE CV LAB;  Service: Cardiovascular;  Laterality: N/A;   LOOP RECORDER IMPLANT N/A 03/31/2013   Procedure: LOOP RECORDER IMPLANT;  Surgeon: Lynwood JONETTA Kelsie, MD;  Location: [facility] CATH LAB;  Service: Cardiovascular;  Laterality: N/A;   MITRAL VALVE REPAIR Right 09/27/2012   Procedure: MINIMALLY INVASIVE MITRAL VALVE REPAIR (MVR);  Surgeon: Sudie VEAR Laine, MD;  Location: [facility] OR;  Service: Open Heart Surgery;  Laterality: Right;  Ultrasound guided   PERCUTANEOUS CORONARY STENT INTERVENTION (PCI-S) N/A 08/25/2012   Procedure: PERCUTANEOUS CORONARY STENT INTERVENTION (PCI-S);  Surgeon: Ozell Fell, MD;  Location: [facility] CATH LAB;  Service: Cardiovascular;  Laterality: N/A;   POLYPECTOMY     SEPTOPLASTY     Dr. Floy   TEE WITHOUT CARDIOVERSION N/A 08/12/2012   Procedure: TRANSESOPHAGEAL ECHOCARDIOGRAM (TEE);  Surgeon: Ezra GORMAN Shuck, MD;  Location: [facility] ENDOSCOPY;  Service: Cardiovascular;  Laterality: N/A;   TONSILLECTOMY     UVULOPALATOPHARYNGOPLASTY      Social History   Socioeconomic History   Marital status: Divorced    Spouse name: Not on file   Number of children: 0   Years of education: Not on file   Highest education level: 12th grade  Occupational History   Occupation: Works at [employer] Retired  Tobacco Use   Smoking status: Former    Current packs/day: 0.00    Average packs/day: 2.5 packs/day for 5.0 years (12.5 ttl pk-yrs)    Types: Cigarettes    Start date: 07/06/1973    Quit date: 07/06/1978    Years since quitting: 45.9   Smokeless tobacco: Never   Tobacco comments:    social drinker  Vaping Use   Vaping status: Never Used  Substance and Sexual Activity   Alcohol use: Yes    Comment: OCC.   Drug use: No   Sexual activity: Yes  Other Topics Concern   Not on file  Social History Narrative   Works at [employer] -  plans to retire 07/2015 after 36 years.   Divorced, lives alone/2025   Supportive g-friend (shirlee moore)         Social Drivers of Health   Financial Resource Strain: Low Risk  (05/26/2024)   Overall Financial Resource Strain (CARDIA)    Difficulty of Paying Living Expenses: Not hard at all  Food Insecurity: No Food Insecurity (05/26/2024)   Hunger Vital Sign    Worried About Running Out of Food in the Last Year: Never true    Ran Out of Food in the Last Year: Never true  Transportation Needs: No Transportation Needs (05/26/2024)   PRAPARE - Administrator, Civil Service (Medical): No  Lack of Transportation (Non-Medical): No  Physical Activity: Inactive (05/26/2024)   Exercise Vital Sign    Days of Exercise per Week: 0 days    Minutes of Exercise per Session: Not on file  Stress: No Stress Concern Present (05/26/2024)   [employer] of Occupational Health - Occupational Stress Questionnaire    Feeling of Stress: Not at all  Social Connections: Moderately Isolated (05/26/2024)   Social Connection and Isolation Panel    Frequency of Communication with Friends and Family: More than three times a week    Frequency of Social Gatherings with Friends and Family: Once a week    Attends Religious Services: More than 4 times per year    Active Member of [employer] or Organizations: No    Attends Engineer, Structural: Not on file    Marital Status: Divorced  Intimate Partner Violence: Patient Unable To Answer (11/23/2023)   Humiliation, Afraid, Rape, and Kick questionnaire    Fear of Current or Ex-Partner: Patient unable to answer    Emotionally Abused: Patient unable to answer    Physically Abused: Patient unable to answer    Sexually Abused: Patient unable to answer    Family History  Problem Relation Age of Onset   Diabetes Mother    Stroke Mother    Heart disease Father    Colon cancer Father 102   Diabetes Father    Renal cancer Father    Cancer  Father    Sudden death Brother    Heart attack Brother    Esophageal cancer Neg Hx    Rectal cancer Neg Hx    Stomach cancer Neg Hx    Colon polyps Neg Hx     Anti-infectives: Anti-infectives (From admission, onward)    Start     Dose/Rate Route Frequency Ordered Stop   05/31/24 2345  cefTRIAXone  (ROCEPHIN ) 2 g in sodium chloride  0.9 % 100 mL IVPB  Status:  Discontinued        2 g 200 mL/hr over 30 Minutes Intravenous Every 24 hours 05/31/24 2343 06/01/24 0708   05/31/24 2345  azithromycin  (ZITHROMAX ) 500 mg in sodium chloride  0.9 % 250 mL IVPB  Status:  Discontinued        500 mg 250 mL/hr over 60 Minutes Intravenous Every 24 hours 05/31/24 2343 06/01/24 0708       Current Facility-Administered Medications  Medication Dose Route Frequency Provider Last Rate Last Admin   acetaminophen  (TYLENOL ) tablet 500 mg  500 mg Oral Q6H PRN Pahwani, Ravi, MD       amiodarone  (PACERONE ) tablet 200 mg  200 mg Oral BID Pahwani, Ravi, MD   200 mg at 06/02/24 0810   Followed by   NOREEN ON 06/29/2024] amiodarone  (PACERONE ) tablet 200 mg  200 mg Oral Daily Pahwani, Ravi, MD       atorvastatin  (LIPITOR) tablet 40 mg  40 mg Oral Daily Pahwani, Ravi, MD   40 mg at 06/02/24 0810   clopidogrel  (PLAVIX ) tablet 75 mg  75 mg Oral Daily Pahwani, Ravi, MD   75 mg at 06/02/24 1100   enoxaparin  (LOVENOX ) injection 30 mg  30 mg Subcutaneous Daily Pahwani, Ravi, MD   30 mg at 06/02/24 9188   fenofibrate  tablet 54 mg  54 mg Oral Daily Pahwani, Ravi, MD   54 mg at 06/02/24 0813   insulin  aspart (novoLOG ) injection 0-15 Units  0-15 Units Subcutaneous TID WC Vernon Ranks, MD   2 Units at 06/02/24 9188   insulin  aspart (  novoLOG ) injection 0-5 Units  0-5 Units Subcutaneous QHS Pahwani, Ravi, MD       metoprolol  succinate (TOPROL -XL) 24 hr tablet 100 mg  100 mg Oral Daily Pahwani, Ravi, MD   100 mg at 06/02/24 0810   ondansetron  (ZOFRAN ) tablet 4 mg  4 mg Oral Q6H PRN Vernon Ranks, MD       Or   ondansetron   (ZOFRAN ) injection 4 mg  4 mg Intravenous Q6H PRN Pahwani, Ravi, MD   4 mg at 06/02/24 1102   Oral care mouth rinse  15 mL Mouth Rinse PRN Vernon Ranks, MD       sodium chloride  flush (NS) 0.9 % injection 3 mL  3 mL Intravenous Q12H Vernon Ranks, MD   3 mL at 06/02/24 0817   tamsulosin  (FLOMAX ) capsule 0.4 mg  0.4 mg Oral Daily Vernon Ranks, MD   0.4 mg at 06/02/24 0810     Objective: Vital signs in last 24 hours: BP (!) 110/95 (BP Location: Right Arm)   Pulse (!) 58   Temp 98.2 F (36.8 C)   Resp 19   Ht 5' 10 (1.778 m)   Wt 99.8 kg   SpO2 95%   BMI 31.57 kg/m   Intake/Output from previous day: 11/27 0701 - 11/28 0700 In: 3219.9 [P.O.:800; I.V.:2419.9] Out: 1640 [Urine:1640] Intake/Output this shift: Total I/O In: 240 [P.O.:240] Out: -    Physical Exam Vitals reviewed.  Constitutional:      Appearance: Normal appearance. He is obese.  Cardiovascular:     Rate and Rhythm: Normal rate. Rhythm irregular.     Heart sounds: Normal heart sounds.  Pulmonary:     Effort: Pulmonary effort is normal. No respiratory distress.     Breath sounds: Normal breath sounds.  Abdominal:     Palpations: Abdomen is soft.     Tenderness: There is no right CVA tenderness or left CVA tenderness.  Skin:    General: Skin is warm and dry.  Neurological:     General: No focal deficit present.     Mental Status: He is alert and oriented to person, place, and time.     Lab Results:  Results for orders placed or performed during the hospital encounter of 05/31/24 (from the past 24 hours)  Glucose, capillary     Status: None   Collection Time: 06/01/24  4:44 PM  Result Value Ref Range   Glucose-Capillary 95 70 - 99 mg/dL  Glucose, capillary     Status: Abnormal   Collection Time: 06/01/24  9:00 PM  Result Value Ref Range   Glucose-Capillary 107 (H) 70 - 99 mg/dL  CBC     Status: Abnormal   Collection Time: 06/02/24  2:59 AM  Result Value Ref Range   WBC 6.7 4.0 - 10.5 K/uL   RBC  3.54 (L) 4.22 - 5.81 MIL/uL   Hemoglobin 10.6 (L) 13.0 - 17.0 g/dL   HCT 67.7 (L) 60.9 - 47.9 %   MCV 91.0 80.0 - 100.0 fL   MCH 29.9 26.0 - 34.0 pg   MCHC 32.9 30.0 - 36.0 g/dL   RDW 85.7 88.4 - 84.4 %   Platelets 200 150 - 400 K/uL   nRBC 0.0 0.0 - 0.2 %  Comprehensive metabolic panel with GFR     Status: Abnormal   Collection Time: 06/02/24  2:59 AM  Result Value Ref Range   Sodium 139 135 - 145 mmol/L   Potassium 3.6 3.5 - 5.1 mmol/L   Chloride  105 98 - 111 mmol/L   CO2 24 22 - 32 mmol/L   Glucose, Bld 130 (H) 70 - 99 mg/dL   BUN 35 (H) 8 - 23 mg/dL   Creatinine, Ser 7.16 (H) 0.61 - 1.24 mg/dL   Calcium  8.3 (L) 8.9 - 10.3 mg/dL   Total Protein 5.4 (L) 6.5 - 8.1 g/dL   Albumin  2.7 (L) 3.5 - 5.0 g/dL   AST 18 15 - 41 U/L   ALT 11 0 - 44 U/L   Alkaline Phosphatase 30 (L) 38 - 126 U/L   Total Bilirubin 0.8 0.0 - 1.2 mg/dL   GFR, Estimated 23 (L) >60 mL/min   Anion gap 10 5 - 15  Glucose, capillary     Status: Abnormal   Collection Time: 06/02/24  8:01 AM  Result Value Ref Range   Glucose-Capillary 147 (H) 70 - 99 mg/dL  Glucose, capillary     Status: Abnormal   Collection Time: 06/02/24 11:34 AM  Result Value Ref Range   Glucose-Capillary 103 (H) 70 - 99 mg/dL    BMET Recent Labs    06/01/24 0408 06/02/24 0259  NA 139 139  K 3.6 3.6  CL 102 105  CO2 26 24  GLUCOSE 99 130*  BUN 40* 35*  CREATININE 2.95* 2.83*  CALCIUM  8.7* 8.3*   PT/INR No results for input(s): LABPROT, INR in the last 72 hours. ABG No results for input(s): PHART, HCO3 in the last 72 hours.  Invalid input(s): PCO2, PO2  Studies/Results: CT ABDOMEN PELVIS WO CONTRAST Result Date: 05/31/2024 EXAM: CT ABDOMEN AND PELVIS WITHOUT CONTRAST 05/31/2024 10:34:00 PM TECHNIQUE: CT of the abdomen and pelvis was performed without the administration of intravenous contrast. Multiplanar reformatted images are provided for review. Automated exposure control, iterative reconstruction, and/or  weight-based adjustment of the mA/kV was utilized to reduce the radiation dose to as low as reasonably achievable. COMPARISON: Abdominal radiograph 05/05/2017 and CT abdomen and pelvis 01/04/2006. CLINICAL HISTORY: Abdominal pain, acute, nonlocalized; P.o. intolerance. FINDINGS: LOWER CHEST: Small bilateral pleural effusions, greater on the right. Diffuse interstitial reticular nodular and alveolar infiltrates in the lung bases. This probably represents edema, possibly with underlying chronic interstitial lung disease. Pneumonia or aspiration could also have this pattern. Cardiac enlargement. LIVER: The liver is unremarkable. GALLBLADDER AND BILE DUCTS: Gallbladder is unremarkable. No biliary ductal dilatation. SPLEEN: No acute abnormality. PANCREAS: No acute abnormality. ADRENAL GLANDS: No acute abnormality. KIDNEYS, URETERS AND BLADDER: Right kidney: 9 mm stone in the mid right ureter at the level of L4 with prominent hydronephrosis and hydroureter and stranding around the right kidney. Distal right ureter is decompressed. Left kidney: 3.3 cm cyst on the left kidney. No imaging follow-up is indicated. Per consensus, no follow-up is needed for simple Bosniak type 1 and 2 renal cysts, unless the patient has a malignancy history or risk factors. Left kidney and left ureter are otherwise unremarkable. Urinary bladder is unremarkable. GI AND BOWEL: The stomach, small bowel, and colon are not abnormally distended. No wall thickening or inflammatory stranding is appreciated. The appendix is normal. There is no bowel obstruction. PERITONEUM AND RETROPERITONEUM: No ascites. No free air. VASCULATURE: Calcification of the aorta. Aorta is normal in caliber. No aneurysm. LYMPH NODES: No lymphadenopathy. REPRODUCTIVE ORGANS: The prostate gland is mildly enlarged. BONES AND SOFT TISSUES: Degenerative changes in the spine. No acute osseous abnormality. No focal soft tissue abnormality. IMPRESSION: 1. 9 mm stone in the mid right  ureter at the level of L4 with prominent hydronephrosis,  hydroureter, and perinephric stranding. Distal ureter is decompressed. 2. Small bilateral pleural effusions, greater on the right, with diffuse interstitial, reticulonodular, and alveolar infiltrates in the lung bases, which may reflect edema, chronic interstitial lung disease, pneumonia, or aspiration. 3. Cardiac enlargement. Electronically signed by: Elsie Gravely MD 05/31/2024 10:43 PM EST RP Workstation: [id number]   DG Chest 2 View Result Date: 05/31/2024 EXAM: 2 VIEW(S) XRAY OF THE CHEST 05/31/2024 08:17:00 PM COMPARISON: 05/30/2024 CLINICAL HISTORY: bradycardia bradycardia FINDINGS: LINES, TUBES AND DEVICES: Left AICD remains in place, unchanged. LUNGS AND PLEURA: Worsening interstitial opacities throughout the lungs, likely interstitial edema. No pleural effusion. No pneumothorax. HEART AND MEDIASTINUM: Cardiomegaly. Left AICD remains in place, unchanged. BONES AND SOFT TISSUES: No acute osseous abnormality. IMPRESSION: 1. Worsening interstitial opacities throughout the lungs, likely interstitial edema. 2. Cardiomegaly. Electronically signed by: Franky Crease MD 05/31/2024 08:21 PM EST RP Workstation: [id number]     Assessment/Plan: 9mm right proximal stone with obstruction and progressive AKI.   He needs cystoscopy with placement of right ureteral stent and then will need ureteroscopy or ESWL for management of the stone at a later date.   I have reviewed the risks of the procedure including bleeding, infection, ureteral injury, need for secondary procedures such as a nephrostomy tube, ureteroscopy or ESWL, thrombotic events and anesthetic complications.    He is on Plavix  and Lovenox  with his history of afib.  He would probably be set served with ureteroscopy for definitive stone management in the next few weeks.         No follow-ups on file.    CC: Dr. Lonni Dalton.      Frisco Cordts 06/02/2024

## 2024-06-02 NOTE — TOC CM/SW Note (Signed)
 Transition of Care [facility]) - Inpatient Brief Assessment   Patient Details  Name: Christopher [name] MRN: [id number] Date of Birth: [birth year]  Transition of Care [facility]) CM/SW Contact:    Tom-Johnson, Harvest Muskrat, RN Phone Number: 06/02/2024, 10:33 AM   Clinical Narrative:  Patient sent by his PCP to the ED with Generalized Weakness, Decreased Appetite, Cough, N/V. Labs in ED showed Creatinine 3.1, CBC showed mild Anemia but no Leukocytosis, Troponin x 2 negative, Lipase unremarkable, Chest x-ray with Interstitial Opacities/Edema and Cardiomegaly, CT Chest/Abdomen showed incidental 9 mm Stone in the Rt mid Ureter with Hydronephrosis and Hydroureter.  Patient admitted with Acute on chronic Kidney Failure. Urology consulted, no intervention needed at this time. Has hx of CAD s/p PCI to RCA 2014, SBE, MR s/p repair 2014, Cardiac Arrest s/p Medtronic single chamber ICD 2018, NICM, Postop A-Fib, HTN, HLD, CVA and OSA, Ventricular Fibrillation. On Amiodarone , Plavix . Completed IV abx.   CM spoke with patient at bedside about needs for post hospital transition. Patient lives alone, does not have children. States his wife passed away few years ago and her children and grandchildren assists sometimes. Has one supportive sister. Has a cane, walker, shower seat and rails at home.  PCP is [name], MD and uses [facility] on [address]   No ICM needs or recommendations noted at this time.  Patient not Medically ready for discharge.  CM will continue to follow as patient progresses with care towards discharge.          Transition of Care Asessment: Insurance and Status: Insurance coverage has been reviewed Patient has primary care physician: Yes Home environment has been reviewed: Yes Prior level of function:: Independent Prior/Current Home Services: No current home services Social Drivers of Health Review: SDOH reviewed no interventions  necessary Readmission risk has been reviewed: Yes Transition of care needs: no transition of care needs at this time

## 2024-06-02 NOTE — Progress Notes (Signed)
 Pt refused Amiodarone , stated it makes him feel sick and he wasn't going to take something that made him feel like that. Consent is signed for procedure tomorrow, placed in chart.   Bari HERO Cole Klugh

## 2024-06-02 NOTE — H&P (View-Only) (Signed)
 Subjective: 1. AKI (acute kidney injury)   2. Nephrolithiasis      Consult requested by Dr.  Lonni Dalton.   Christopher [name] is a 73 yo male who was admitted on 05/31/24 with early satiety, emesis, malaise and fatigue.  He was found to have a Cr of 3.22 on admission which was up from 2.04 in 9/25. An initial conversation with urology had the recommendation of hydration but his Creatinine has declined only slightly with medical management and Dr. Warner consulted regarding possible stenting.  His UA was negative.  He has no prior GU history in the documentation and is not a patient of our practice.    He has a history of BPH with BOO and is on tamsulosin .   ROS:  Review of Systems  Constitutional:  Positive for malaise/fatigue.  Respiratory:  Positive for cough.   Gastrointestinal:  Positive for nausea.  Genitourinary:  Negative for flank pain and hematuria.  All other systems reviewed and are negative.   Allergies  Allergen Reactions   Codeine Other (See Comments)    Causes bad constipation   Dust Mite Extract Cough   Pollen Extract Cough    Past Medical History:  Diagnosis Date   Arthritis    Atrial fibrillation (HCC)    post op, intol of anticoag   Cardiac arrest (HCC)    a. s/p MDT single chamber ICD; 11-10-18- pt denies having a heart attack   Cataract    CHF (congestive heart failure) (HCC)    Chronic kidney disease    kidney stone   Coronary artery disease    a. 2/7 Cath: LM nl, LAD min irregs, LCX min irregs, RI 40, RCA 63m, EF 55-60% basal to mid inf HK, 3-4+ MR;  b. 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/22/2008   Strep viridans   Ventricular fibrillation (HCC) 11/07/2019   appropriate shock (36J) for VF delivered    Past Surgical History:  Procedure Laterality Date   AMPUTATION  07/28/2012   Procedure: AMPUTATION DIGIT;  Surgeon: Deward DELENA Schwartz, MD;  Location: [facility] ORS;  Service: Orthopedics;  Laterality: Right;  2nd toe   CARDIAC CATHETERIZATION     COLONOSCOPY     EP IMPLANTABLE DEVICE N/A 05/01/2016   Procedure: Loop Recorder Removal;  Surgeon: Lynwood Rakers, MD;  Location: [facility] INVASIVE CV LAB;  Service: Cardiovascular;  Laterality: N/A;   FOOT SURGERY     BILATERAL TOES   ICD IMPLANT N/A 05/07/2017    Medtronic Visia AF MRI VR SureScan implanted by Dr Inocencio following VF arrest   Implantable loop recorder placement  03/31/2013   MDT Linq implanted  by Dr Kelsie to evaluate for further afib   INTRAOPERATIVE TRANSESOPHAGEAL ECHOCARDIOGRAM N/A 09/27/2012   Procedure: INTRAOPERATIVE TRANSESOPHAGEAL ECHOCARDIOGRAM;  Surgeon: Sudie VEAR Laine, MD;  Location: [facility] OR;  Service: Open Heart Surgery;  Laterality: N/A;   LEFT AND RIGHT HEART CATHETERIZATION WITH CORONARY ANGIOGRAM N/A 08/12/2012   Procedure: LEFT AND RIGHT HEART CATHETERIZATION WITH CORONARY ANGIOGRAM;  Surgeon: Ezra GORMAN Shuck, MD;  Location: [facility] CATH LAB;  Service: Cardiovascular;  Laterality: N/A;   LEFT HEART CATH AND CORONARY ANGIOGRAPHY N/A 05/06/2017   Procedure: LEFT HEART CATH AND CORONARY ANGIOGRAPHY;  Surgeon: Anner Alm ORN, MD;  Location: [facility] INVASIVE CV LAB;  Service: Cardiovascular;  Laterality: N/A;    LEFT HEART CATH AND CORONARY ANGIOGRAPHY N/A 08/12/2023   Procedure: LEFT HEART CATH AND CORONARY ANGIOGRAPHY;  Surgeon: Jordan, Peter M, MD;  Location: [facility] INVASIVE CV LAB;  Service: Cardiovascular;  Laterality: N/A;   LOOP RECORDER IMPLANT N/A 03/31/2013   Procedure: LOOP RECORDER IMPLANT;  Surgeon: Lynwood JONETTA Kelsie, MD;  Location: [facility] CATH LAB;  Service: Cardiovascular;  Laterality: N/A;   MITRAL VALVE REPAIR Right 09/27/2012   Procedure: MINIMALLY INVASIVE MITRAL VALVE REPAIR (MVR);  Surgeon: Sudie VEAR Laine, MD;  Location: [facility] OR;  Service: Open Heart Surgery;  Laterality: Right;  Ultrasound guided   PERCUTANEOUS CORONARY STENT INTERVENTION (PCI-S) N/A 08/25/2012   Procedure: PERCUTANEOUS CORONARY STENT INTERVENTION (PCI-S);  Surgeon: Ozell Fell, MD;  Location: [facility] CATH LAB;  Service: Cardiovascular;  Laterality: N/A;   POLYPECTOMY     SEPTOPLASTY     Dr. Floy   TEE WITHOUT CARDIOVERSION N/A 08/12/2012   Procedure: TRANSESOPHAGEAL ECHOCARDIOGRAM (TEE);  Surgeon: Ezra GORMAN Shuck, MD;  Location: [facility] ENDOSCOPY;  Service: Cardiovascular;  Laterality: N/A;   TONSILLECTOMY     UVULOPALATOPHARYNGOPLASTY      Social History   Socioeconomic History   Marital status: Divorced    Spouse name: Not on file   Number of children: 0   Years of education: Not on file   Highest education level: 12th grade  Occupational History   Occupation: Works at [employer] Retired  Tobacco Use   Smoking status: Former    Current packs/day: 0.00    Average packs/day: 2.5 packs/day for 5.0 years (12.5 ttl pk-yrs)    Types: Cigarettes    Start date: 07/06/1973    Quit date: 07/06/1978    Years since quitting: 45.9   Smokeless tobacco: Never   Tobacco comments:    social drinker  Vaping Use   Vaping status: Never Used  Substance and Sexual Activity   Alcohol use: Yes    Comment: OCC.   Drug use: No   Sexual activity: Yes  Other Topics Concern   Not on file  Social History Narrative   Works at [employer] -  plans to retire 07/2015 after 36 years.   Divorced, lives alone/2025   Supportive g-friend (shirlee moore)         Social Drivers of Health   Financial Resource Strain: Low Risk  (05/26/2024)   Overall Financial Resource Strain (CARDIA)    Difficulty of Paying Living Expenses: Not hard at all  Food Insecurity: No Food Insecurity (05/26/2024)   Hunger Vital Sign    Worried About Running Out of Food in the Last Year: Never true    Ran Out of Food in the Last Year: Never true  Transportation Needs: No Transportation Needs (05/26/2024)   PRAPARE - Administrator, Civil Service (Medical): No  Lack of Transportation (Non-Medical): No  Physical Activity: Inactive (05/26/2024)   Exercise Vital Sign    Days of Exercise per Week: 0 days    Minutes of Exercise per Session: Not on file  Stress: No Stress Concern Present (05/26/2024)   [employer] of Occupational Health - Occupational Stress Questionnaire    Feeling of Stress: Not at all  Social Connections: Moderately Isolated (05/26/2024)   Social Connection and Isolation Panel    Frequency of Communication with Friends and Family: More than three times a week    Frequency of Social Gatherings with Friends and Family: Once a week    Attends Religious Services: More than 4 times per year    Active Member of [employer] or Organizations: No    Attends Engineer, Structural: Not on file    Marital Status: Divorced  Intimate Partner Violence: Patient Unable To Answer (11/23/2023)   Humiliation, Afraid, Rape, and Kick questionnaire    Fear of Current or Ex-Partner: Patient unable to answer    Emotionally Abused: Patient unable to answer    Physically Abused: Patient unable to answer    Sexually Abused: Patient unable to answer    Family History  Problem Relation Age of Onset   Diabetes Mother    Stroke Mother    Heart disease Father    Colon cancer Father 102   Diabetes Father    Renal cancer Father    Cancer  Father    Sudden death Brother    Heart attack Brother    Esophageal cancer Neg Hx    Rectal cancer Neg Hx    Stomach cancer Neg Hx    Colon polyps Neg Hx     Anti-infectives: Anti-infectives (From admission, onward)    Start     Dose/Rate Route Frequency Ordered Stop   05/31/24 2345  cefTRIAXone  (ROCEPHIN ) 2 g in sodium chloride  0.9 % 100 mL IVPB  Status:  Discontinued        2 g 200 mL/hr over 30 Minutes Intravenous Every 24 hours 05/31/24 2343 06/01/24 0708   05/31/24 2345  azithromycin  (ZITHROMAX ) 500 mg in sodium chloride  0.9 % 250 mL IVPB  Status:  Discontinued        500 mg 250 mL/hr over 60 Minutes Intravenous Every 24 hours 05/31/24 2343 06/01/24 0708       Current Facility-Administered Medications  Medication Dose Route Frequency Provider Last Rate Last Admin   acetaminophen  (TYLENOL ) tablet 500 mg  500 mg Oral Q6H PRN Pahwani, Ravi, MD       amiodarone  (PACERONE ) tablet 200 mg  200 mg Oral BID Pahwani, Ravi, MD   200 mg at 06/02/24 0810   Followed by   NOREEN ON 06/29/2024] amiodarone  (PACERONE ) tablet 200 mg  200 mg Oral Daily Pahwani, Ravi, MD       atorvastatin  (LIPITOR) tablet 40 mg  40 mg Oral Daily Pahwani, Ravi, MD   40 mg at 06/02/24 0810   clopidogrel  (PLAVIX ) tablet 75 mg  75 mg Oral Daily Pahwani, Ravi, MD   75 mg at 06/02/24 1100   enoxaparin  (LOVENOX ) injection 30 mg  30 mg Subcutaneous Daily Pahwani, Ravi, MD   30 mg at 06/02/24 9188   fenofibrate  tablet 54 mg  54 mg Oral Daily Pahwani, Ravi, MD   54 mg at 06/02/24 0813   insulin  aspart (novoLOG ) injection 0-15 Units  0-15 Units Subcutaneous TID WC Vernon Ranks, MD   2 Units at 06/02/24 9188   insulin  aspart (  novoLOG ) injection 0-5 Units  0-5 Units Subcutaneous QHS Pahwani, Ravi, MD       metoprolol  succinate (TOPROL -XL) 24 hr tablet 100 mg  100 mg Oral Daily Pahwani, Ravi, MD   100 mg at 06/02/24 0810   ondansetron  (ZOFRAN ) tablet 4 mg  4 mg Oral Q6H PRN Vernon Ranks, MD       Or   ondansetron   (ZOFRAN ) injection 4 mg  4 mg Intravenous Q6H PRN Pahwani, Ravi, MD   4 mg at 06/02/24 1102   Oral care mouth rinse  15 mL Mouth Rinse PRN Vernon Ranks, MD       sodium chloride  flush (NS) 0.9 % injection 3 mL  3 mL Intravenous Q12H Vernon Ranks, MD   3 mL at 06/02/24 0817   tamsulosin  (FLOMAX ) capsule 0.4 mg  0.4 mg Oral Daily Vernon Ranks, MD   0.4 mg at 06/02/24 0810     Objective: Vital signs in last 24 hours: BP (!) 110/95 (BP Location: Right Arm)   Pulse (!) 58   Temp 98.2 F (36.8 C)   Resp 19   Ht 5' 10 (1.778 m)   Wt 99.8 kg   SpO2 95%   BMI 31.57 kg/m   Intake/Output from previous day: 11/27 0701 - 11/28 0700 In: 3219.9 [P.O.:800; I.V.:2419.9] Out: 1640 [Urine:1640] Intake/Output this shift: Total I/O In: 240 [P.O.:240] Out: -    Physical Exam Vitals reviewed.  Constitutional:      Appearance: Normal appearance. He is obese.  Cardiovascular:     Rate and Rhythm: Normal rate. Rhythm irregular.     Heart sounds: Normal heart sounds.  Pulmonary:     Effort: Pulmonary effort is normal. No respiratory distress.     Breath sounds: Normal breath sounds.  Abdominal:     Palpations: Abdomen is soft.     Tenderness: There is no right CVA tenderness or left CVA tenderness.  Skin:    General: Skin is warm and dry.  Neurological:     General: No focal deficit present.     Mental Status: He is alert and oriented to person, place, and time.     Lab Results:  Results for orders placed or performed during the hospital encounter of 05/31/24 (from the past 24 hours)  Glucose, capillary     Status: None   Collection Time: 06/01/24  4:44 PM  Result Value Ref Range   Glucose-Capillary 95 70 - 99 mg/dL  Glucose, capillary     Status: Abnormal   Collection Time: 06/01/24  9:00 PM  Result Value Ref Range   Glucose-Capillary 107 (H) 70 - 99 mg/dL  CBC     Status: Abnormal   Collection Time: 06/02/24  2:59 AM  Result Value Ref Range   WBC 6.7 4.0 - 10.5 K/uL   RBC  3.54 (L) 4.22 - 5.81 MIL/uL   Hemoglobin 10.6 (L) 13.0 - 17.0 g/dL   HCT 67.7 (L) 60.9 - 47.9 %   MCV 91.0 80.0 - 100.0 fL   MCH 29.9 26.0 - 34.0 pg   MCHC 32.9 30.0 - 36.0 g/dL   RDW 85.7 88.4 - 84.4 %   Platelets 200 150 - 400 K/uL   nRBC 0.0 0.0 - 0.2 %  Comprehensive metabolic panel with GFR     Status: Abnormal   Collection Time: 06/02/24  2:59 AM  Result Value Ref Range   Sodium 139 135 - 145 mmol/L   Potassium 3.6 3.5 - 5.1 mmol/L   Chloride  105 98 - 111 mmol/L   CO2 24 22 - 32 mmol/L   Glucose, Bld 130 (H) 70 - 99 mg/dL   BUN 35 (H) 8 - 23 mg/dL   Creatinine, Ser 7.16 (H) 0.61 - 1.24 mg/dL   Calcium  8.3 (L) 8.9 - 10.3 mg/dL   Total Protein 5.4 (L) 6.5 - 8.1 g/dL   Albumin  2.7 (L) 3.5 - 5.0 g/dL   AST 18 15 - 41 U/L   ALT 11 0 - 44 U/L   Alkaline Phosphatase 30 (L) 38 - 126 U/L   Total Bilirubin 0.8 0.0 - 1.2 mg/dL   GFR, Estimated 23 (L) >60 mL/min   Anion gap 10 5 - 15  Glucose, capillary     Status: Abnormal   Collection Time: 06/02/24  8:01 AM  Result Value Ref Range   Glucose-Capillary 147 (H) 70 - 99 mg/dL  Glucose, capillary     Status: Abnormal   Collection Time: 06/02/24 11:34 AM  Result Value Ref Range   Glucose-Capillary 103 (H) 70 - 99 mg/dL    BMET Recent Labs    06/01/24 0408 06/02/24 0259  NA 139 139  K 3.6 3.6  CL 102 105  CO2 26 24  GLUCOSE 99 130*  BUN 40* 35*  CREATININE 2.95* 2.83*  CALCIUM  8.7* 8.3*   PT/INR No results for input(s): LABPROT, INR in the last 72 hours. ABG No results for input(s): PHART, HCO3 in the last 72 hours.  Invalid input(s): PCO2, PO2  Studies/Results: CT ABDOMEN PELVIS WO CONTRAST Result Date: 05/31/2024 EXAM: CT ABDOMEN AND PELVIS WITHOUT CONTRAST 05/31/2024 10:34:00 PM TECHNIQUE: CT of the abdomen and pelvis was performed without the administration of intravenous contrast. Multiplanar reformatted images are provided for review. Automated exposure control, iterative reconstruction, and/or  weight-based adjustment of the mA/kV was utilized to reduce the radiation dose to as low as reasonably achievable. COMPARISON: Abdominal radiograph 05/05/2017 and CT abdomen and pelvis 01/04/2006. CLINICAL HISTORY: Abdominal pain, acute, nonlocalized; P.o. intolerance. FINDINGS: LOWER CHEST: Small bilateral pleural effusions, greater on the right. Diffuse interstitial reticular nodular and alveolar infiltrates in the lung bases. This probably represents edema, possibly with underlying chronic interstitial lung disease. Pneumonia or aspiration could also have this pattern. Cardiac enlargement. LIVER: The liver is unremarkable. GALLBLADDER AND BILE DUCTS: Gallbladder is unremarkable. No biliary ductal dilatation. SPLEEN: No acute abnormality. PANCREAS: No acute abnormality. ADRENAL GLANDS: No acute abnormality. KIDNEYS, URETERS AND BLADDER: Right kidney: 9 mm stone in the mid right ureter at the level of L4 with prominent hydronephrosis and hydroureter and stranding around the right kidney. Distal right ureter is decompressed. Left kidney: 3.3 cm cyst on the left kidney. No imaging follow-up is indicated. Per consensus, no follow-up is needed for simple Bosniak type 1 and 2 renal cysts, unless the patient has a malignancy history or risk factors. Left kidney and left ureter are otherwise unremarkable. Urinary bladder is unremarkable. GI AND BOWEL: The stomach, small bowel, and colon are not abnormally distended. No wall thickening or inflammatory stranding is appreciated. The appendix is normal. There is no bowel obstruction. PERITONEUM AND RETROPERITONEUM: No ascites. No free air. VASCULATURE: Calcification of the aorta. Aorta is normal in caliber. No aneurysm. LYMPH NODES: No lymphadenopathy. REPRODUCTIVE ORGANS: The prostate gland is mildly enlarged. BONES AND SOFT TISSUES: Degenerative changes in the spine. No acute osseous abnormality. No focal soft tissue abnormality. IMPRESSION: 1. 9 mm stone in the mid right  ureter at the level of L4 with prominent hydronephrosis,  hydroureter, and perinephric stranding. Distal ureter is decompressed. 2. Small bilateral pleural effusions, greater on the right, with diffuse interstitial, reticulonodular, and alveolar infiltrates in the lung bases, which may reflect edema, chronic interstitial lung disease, pneumonia, or aspiration. 3. Cardiac enlargement. Electronically signed by: Elsie Gravely MD 05/31/2024 10:43 PM EST RP Workstation: [id number]   DG Chest 2 View Result Date: 05/31/2024 EXAM: 2 VIEW(S) XRAY OF THE CHEST 05/31/2024 08:17:00 PM COMPARISON: 05/30/2024 CLINICAL HISTORY: bradycardia bradycardia FINDINGS: LINES, TUBES AND DEVICES: Left AICD remains in place, unchanged. LUNGS AND PLEURA: Worsening interstitial opacities throughout the lungs, likely interstitial edema. No pleural effusion. No pneumothorax. HEART AND MEDIASTINUM: Cardiomegaly. Left AICD remains in place, unchanged. BONES AND SOFT TISSUES: No acute osseous abnormality. IMPRESSION: 1. Worsening interstitial opacities throughout the lungs, likely interstitial edema. 2. Cardiomegaly. Electronically signed by: Franky Crease MD 05/31/2024 08:21 PM EST RP Workstation: [id number]     Assessment/Plan: 9mm right proximal stone with obstruction and progressive AKI.   He needs cystoscopy with placement of right ureteral stent and then will need ureteroscopy or ESWL for management of the stone at a later date.   I have reviewed the risks of the procedure including bleeding, infection, ureteral injury, need for secondary procedures such as a nephrostomy tube, ureteroscopy or ESWL, thrombotic events and anesthetic complications.    He is on Plavix  and Lovenox  with his history of afib.  He would probably be set served with ureteroscopy for definitive stone management in the next few weeks.         No follow-ups on file.    CC: Dr. Lonni Dalton.      Christopher [name] 06/02/2024

## 2024-06-02 NOTE — Progress Notes (Signed)
  Progress Note   Patient: Christopher [name] FMW:[id number] DOB: [birth year] DOA: 05/31/2024     2 DOS: the patient was seen and examined on 06/02/2024  a 11:35AM      Brief hospital course: 73 y.o. M with sCHF EF 30-35%, hx MV repair due to endocarditis, AF not on AC, HTN, CAD s/p PCI >34yrs, DM, OSA on CPAP, and obesity who presented with abnormal labs and generalized malaise.  Evidently feeling unwell for several weeks, malaise, fatigue, nausea and cough. Possibly since starting mexiletine.  Had an echo that showed EF stable at 30-35%.      Assessment and plan: AKI on CKD IIIb Cr 1.2 until Feb this year with worsening CHF, and start of GDMT, since then, appears baseline has drifted up to 1.5-2.    Cr here 3.3 on admission.  Diuretics and Entrestol held and given fluid but now seems congested and Cr minimal change. - [facility], appreciate cares - Hold Farxiga , Lasix , metformin , Entresto , spironolactone      Ureteral stone Cr not improving as expected, discussed with Dr. Watt today, will evaluate for decompression - Consult Urology   Infiltrates on CXR Unclear cause.  Do not suspect CHF.  Procalcitonin undetectable, no leukocytosis or fever to suggest infection.  Rales on exam today given fluids for the last 48 hours - Stop fluids - Repeat chest x-ray   Chronic systolic CHF Sounds more congested today - Hold Lasix  and spironolactone  for 1 more day, repeat chest x-ray - Hold Farxiga  and Entresto  given AKI    Coronary disease Hypertension Blood pressure remains soft - Avoid hypotension - Hold Farxiga , furosemide , metoprolol , Entresto , spironolactone  - Continue Plavix  and fenofibrate  - Okay to hold Lipitor for now    Diabetes Glucose remains well-controlled - Hold metformin  and 75/25, and Farxiga  - Continue sliding scale corrections   OSA -CPAP at night   Class 1 obesity BMI 31, complicates care   Paroxysmal atrial fibrillation Rate controlled, not on  anticoagulation due to previous thigh hematoma - Will continue amiodarone  - Hold metoprolol    Normocytic anemia Hemoglobin seems stable      Subjective: Patient feels very weak and tired.  He has no flank pain, no dysuria, no hematuria.  His urine is dark and concentrated, he has a lot of nausea and threw up today.  No fever, no confusion.     Physical Exam: BP (!) 110/95 (BP Location: Right Arm)   Pulse (!) 58   Temp 98.2 F (36.8 C)   Resp 19   Ht 5' 10 (1.778 m)   Wt 99.8 kg   SpO2 95%   BMI 31.57 kg/m   Adult male, sitting up in bed, pale, appears weak and tired RRR, no murmurs, no pitting in the extremities, JVP seems normal Respiratory rate seems increased, he seems short of breath, lung sounds diminished, crackles at bilateral bases Abdomen soft, no tenderness palpation or guarding, no ascites or distention Attention normal, affect tired, oriented x 3, face symmetric, upper extremity strength normal, gait seems normal    Data Reviewed: Basic metabolic panel shows creatinine slightly down to 2.8, BUN remains elevated CBC shows no leukocytosis, mild anemia    Family Communication:     Disposition: Status is: Inpatient         Author: Lonni SHAUNNA Dalton, MD 06/02/2024 1:17 PM  For on call review [email or web address].

## 2024-06-02 NOTE — H&P (View-Only) (Signed)
 Subjective: 1. AKI (acute kidney injury)   2. Nephrolithiasis      Consult requested by Dr.  Lonni Dalton.   Christopher [name] is a 73 yo male who was admitted on 05/31/24 with early satiety, emesis, malaise and fatigue.  He was found to have a Cr of 3.22 on admission which was up from 2.04 in 9/25. An initial conversation with urology had the recommendation of hydration but his Creatinine has declined only slightly with medical management and Dr. Warner consulted regarding possible stenting.  His UA was negative.  He has no prior GU history in the documentation and is not a patient of our practice.    He has a history of BPH with BOO and is on tamsulosin .   ROS:  Review of Systems  Constitutional:  Positive for malaise/fatigue.  Respiratory:  Positive for cough.   Gastrointestinal:  Positive for nausea.  Genitourinary:  Negative for flank pain and hematuria.  All other systems reviewed and are negative.   Allergies  Allergen Reactions   Codeine Other (See Comments)    Causes bad constipation   Dust Mite Extract Cough   Pollen Extract Cough    Past Medical History:  Diagnosis Date   Arthritis    Atrial fibrillation (HCC)    post op, intol of anticoag   Cardiac arrest (HCC)    a. s/p MDT single chamber ICD; 11-10-18- pt denies having a heart attack   Cataract    CHF (congestive heart failure) (HCC)    Chronic kidney disease    kidney stone   Coronary artery disease    a. 2/7 Cath: LM nl, LAD min irregs, LCX min irregs, RI 40, RCA 63m, EF 55-60% basal to mid inf HK, 3-4+ MR;  b. 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/22/2008   Strep viridans   Ventricular fibrillation (HCC) 11/07/2019   appropriate shock (36J) for VF delivered    Past Surgical History:  Procedure Laterality Date   AMPUTATION  07/28/2012   Procedure: AMPUTATION DIGIT;  Surgeon: Deward DELENA Schwartz, MD;  Location: [facility] ORS;  Service: Orthopedics;  Laterality: Right;  2nd toe   CARDIAC CATHETERIZATION     COLONOSCOPY     EP IMPLANTABLE DEVICE N/A 05/01/2016   Procedure: Loop Recorder Removal;  Surgeon: Lynwood Rakers, MD;  Location: [facility] INVASIVE CV LAB;  Service: Cardiovascular;  Laterality: N/A;   FOOT SURGERY     BILATERAL TOES   ICD IMPLANT N/A 05/07/2017    Medtronic Visia AF MRI VR SureScan implanted by Dr Inocencio following VF arrest   Implantable loop recorder placement  03/31/2013   MDT Linq implanted  by Dr Kelsie to evaluate for further afib   INTRAOPERATIVE TRANSESOPHAGEAL ECHOCARDIOGRAM N/A 09/27/2012   Procedure: INTRAOPERATIVE TRANSESOPHAGEAL ECHOCARDIOGRAM;  Surgeon: Sudie VEAR Laine, MD;  Location: [facility] OR;  Service: Open Heart Surgery;  Laterality: N/A;   LEFT AND RIGHT HEART CATHETERIZATION WITH CORONARY ANGIOGRAM N/A 08/12/2012   Procedure: LEFT AND RIGHT HEART CATHETERIZATION WITH CORONARY ANGIOGRAM;  Surgeon: Ezra GORMAN Shuck, MD;  Location: [facility] CATH LAB;  Service: Cardiovascular;  Laterality: N/A;   LEFT HEART CATH AND CORONARY ANGIOGRAPHY N/A 05/06/2017   Procedure: LEFT HEART CATH AND CORONARY ANGIOGRAPHY;  Surgeon: Anner Alm ORN, MD;  Location: [facility] INVASIVE CV LAB;  Service: Cardiovascular;  Laterality: N/A;    LEFT HEART CATH AND CORONARY ANGIOGRAPHY N/A 08/12/2023   Procedure: LEFT HEART CATH AND CORONARY ANGIOGRAPHY;  Surgeon: Jordan, Peter M, MD;  Location: [facility] INVASIVE CV LAB;  Service: Cardiovascular;  Laterality: N/A;   LOOP RECORDER IMPLANT N/A 03/31/2013   Procedure: LOOP RECORDER IMPLANT;  Surgeon: Lynwood JONETTA Kelsie, MD;  Location: [facility] CATH LAB;  Service: Cardiovascular;  Laterality: N/A;   MITRAL VALVE REPAIR Right 09/27/2012   Procedure: MINIMALLY INVASIVE MITRAL VALVE REPAIR (MVR);  Surgeon: Sudie VEAR Laine, MD;  Location: [facility] OR;  Service: Open Heart Surgery;  Laterality: Right;  Ultrasound guided   PERCUTANEOUS CORONARY STENT INTERVENTION (PCI-S) N/A 08/25/2012   Procedure: PERCUTANEOUS CORONARY STENT INTERVENTION (PCI-S);  Surgeon: Ozell Fell, MD;  Location: [facility] CATH LAB;  Service: Cardiovascular;  Laterality: N/A;   POLYPECTOMY     SEPTOPLASTY     Dr. Floy   TEE WITHOUT CARDIOVERSION N/A 08/12/2012   Procedure: TRANSESOPHAGEAL ECHOCARDIOGRAM (TEE);  Surgeon: Ezra GORMAN Shuck, MD;  Location: [facility] ENDOSCOPY;  Service: Cardiovascular;  Laterality: N/A;   TONSILLECTOMY     UVULOPALATOPHARYNGOPLASTY      Social History   Socioeconomic History   Marital status: Divorced    Spouse name: Not on file   Number of children: 0   Years of education: Not on file   Highest education level: 12th grade  Occupational History   Occupation: Works at [employer] Retired  Tobacco Use   Smoking status: Former    Current packs/day: 0.00    Average packs/day: 2.5 packs/day for 5.0 years (12.5 ttl pk-yrs)    Types: Cigarettes    Start date: 07/06/1973    Quit date: 07/06/1978    Years since quitting: 45.9   Smokeless tobacco: Never   Tobacco comments:    social drinker  Vaping Use   Vaping status: Never Used  Substance and Sexual Activity   Alcohol use: Yes    Comment: OCC.   Drug use: No   Sexual activity: Yes  Other Topics Concern   Not on file  Social History Narrative   Works at [employer] -  plans to retire 07/2015 after 36 years.   Divorced, lives alone/2025   Supportive g-friend (shirlee moore)         Social Drivers of Health   Financial Resource Strain: Low Risk  (05/26/2024)   Overall Financial Resource Strain (CARDIA)    Difficulty of Paying Living Expenses: Not hard at all  Food Insecurity: No Food Insecurity (05/26/2024)   Hunger Vital Sign    Worried About Running Out of Food in the Last Year: Never true    Ran Out of Food in the Last Year: Never true  Transportation Needs: No Transportation Needs (05/26/2024)   PRAPARE - Administrator, Civil Service (Medical): No  Lack of Transportation (Non-Medical): No  Physical Activity: Inactive (05/26/2024)   Exercise Vital Sign    Days of Exercise per Week: 0 days    Minutes of Exercise per Session: Not on file  Stress: No Stress Concern Present (05/26/2024)   [employer] of Occupational Health - Occupational Stress Questionnaire    Feeling of Stress: Not at all  Social Connections: Moderately Isolated (05/26/2024)   Social Connection and Isolation Panel    Frequency of Communication with Friends and Family: More than three times a week    Frequency of Social Gatherings with Friends and Family: Once a week    Attends Religious Services: More than 4 times per year    Active Member of [employer] or Organizations: No    Attends Engineer, Structural: Not on file    Marital Status: Divorced  Intimate Partner Violence: Patient Unable To Answer (11/23/2023)   Humiliation, Afraid, Rape, and Kick questionnaire    Fear of Current or Ex-Partner: Patient unable to answer    Emotionally Abused: Patient unable to answer    Physically Abused: Patient unable to answer    Sexually Abused: Patient unable to answer    Family History  Problem Relation Age of Onset   Diabetes Mother    Stroke Mother    Heart disease Father    Colon cancer Father 37   Diabetes Father    Renal cancer Father    Cancer  Father    Sudden death Brother    Heart attack Brother    Esophageal cancer Neg Hx    Rectal cancer Neg Hx    Stomach cancer Neg Hx    Colon polyps Neg Hx     Anti-infectives: Anti-infectives (From admission, onward)    Start     Dose/Rate Route Frequency Ordered Stop   05/31/24 2345  cefTRIAXone  (ROCEPHIN ) 2 g in sodium chloride  0.9 % 100 mL IVPB  Status:  Discontinued        2 g 200 mL/hr over 30 Minutes Intravenous Every 24 hours 05/31/24 2343 06/01/24 0708   05/31/24 2345  azithromycin  (ZITHROMAX ) 500 mg in sodium chloride  0.9 % 250 mL IVPB  Status:  Discontinued        500 mg 250 mL/hr over 60 Minutes Intravenous Every 24 hours 05/31/24 2343 06/01/24 0708       Current Facility-Administered Medications  Medication Dose Route Frequency Provider Last Rate Last Admin   acetaminophen  (TYLENOL ) tablet 500 mg  500 mg Oral Q6H PRN Pahwani, Ravi, MD       amiodarone  (PACERONE ) tablet 200 mg  200 mg Oral BID Pahwani, Ravi, MD   200 mg at 06/02/24 0810   Followed by   NOREEN ON 06/29/2024] amiodarone  (PACERONE ) tablet 200 mg  200 mg Oral Daily Pahwani, Ravi, MD       atorvastatin  (LIPITOR) tablet 40 mg  40 mg Oral Daily Pahwani, Ravi, MD   40 mg at 06/02/24 0810   clopidogrel  (PLAVIX ) tablet 75 mg  75 mg Oral Daily Pahwani, Ravi, MD   75 mg at 06/02/24 1100   enoxaparin  (LOVENOX ) injection 30 mg  30 mg Subcutaneous Daily Pahwani, Ravi, MD   30 mg at 06/02/24 9188   fenofibrate  tablet 54 mg  54 mg Oral Daily Pahwani, Ravi, MD   54 mg at 06/02/24 0813   insulin  aspart (novoLOG ) injection 0-15 Units  0-15 Units Subcutaneous TID WC Vernon Ranks, MD   2 Units at 06/02/24 9188   insulin  aspart (  novoLOG ) injection 0-5 Units  0-5 Units Subcutaneous QHS Pahwani, Ravi, MD       metoprolol  succinate (TOPROL -XL) 24 hr tablet 100 mg  100 mg Oral Daily Pahwani, Ravi, MD   100 mg at 06/02/24 0810   ondansetron  (ZOFRAN ) tablet 4 mg  4 mg Oral Q6H PRN Vernon Ranks, MD       Or   ondansetron   (ZOFRAN ) injection 4 mg  4 mg Intravenous Q6H PRN Pahwani, Ravi, MD   4 mg at 06/02/24 1102   Oral care mouth rinse  15 mL Mouth Rinse PRN Vernon Ranks, MD       sodium chloride  flush (NS) 0.9 % injection 3 mL  3 mL Intravenous Q12H Vernon Ranks, MD   3 mL at 06/02/24 0817   tamsulosin  (FLOMAX ) capsule 0.4 mg  0.4 mg Oral Daily Vernon Ranks, MD   0.4 mg at 06/02/24 0810     Objective: Vital signs in last 24 hours: BP (!) 110/95 (BP Location: Right Arm)   Pulse (!) 58   Temp 98.2 F (36.8 C)   Resp 19   Ht 5' 10 (1.778 m)   Wt 99.8 kg   SpO2 95%   BMI 31.57 kg/m   Intake/Output from previous day: 11/27 0701 - 11/28 0700 In: 3219.9 [P.O.:800; I.V.:2419.9] Out: 1640 [Urine:1640] Intake/Output this shift: Total I/O In: 240 [P.O.:240] Out: -    Physical Exam Vitals reviewed.  Constitutional:      Appearance: Normal appearance. He is obese.  Cardiovascular:     Rate and Rhythm: Normal rate. Rhythm irregular.     Heart sounds: Normal heart sounds.  Pulmonary:     Effort: Pulmonary effort is normal. No respiratory distress.     Breath sounds: Normal breath sounds.  Abdominal:     Palpations: Abdomen is soft.     Tenderness: There is no right CVA tenderness or left CVA tenderness.  Skin:    General: Skin is warm and dry.  Neurological:     General: No focal deficit present.     Mental Status: He is alert and oriented to person, place, and time.     Lab Results:  Results for orders placed or performed during the hospital encounter of 05/31/24 (from the past 24 hours)  Glucose, capillary     Status: None   Collection Time: 06/01/24  4:44 PM  Result Value Ref Range   Glucose-Capillary 95 70 - 99 mg/dL  Glucose, capillary     Status: Abnormal   Collection Time: 06/01/24  9:00 PM  Result Value Ref Range   Glucose-Capillary 107 (H) 70 - 99 mg/dL  CBC     Status: Abnormal   Collection Time: 06/02/24  2:59 AM  Result Value Ref Range   WBC 6.7 4.0 - 10.5 K/uL   RBC  3.54 (L) 4.22 - 5.81 MIL/uL   Hemoglobin 10.6 (L) 13.0 - 17.0 g/dL   HCT 67.7 (L) 60.9 - 47.9 %   MCV 91.0 80.0 - 100.0 fL   MCH 29.9 26.0 - 34.0 pg   MCHC 32.9 30.0 - 36.0 g/dL   RDW 85.7 88.4 - 84.4 %   Platelets 200 150 - 400 K/uL   nRBC 0.0 0.0 - 0.2 %  Comprehensive metabolic panel with GFR     Status: Abnormal   Collection Time: 06/02/24  2:59 AM  Result Value Ref Range   Sodium 139 135 - 145 mmol/L   Potassium 3.6 3.5 - 5.1 mmol/L   Chloride  105 98 - 111 mmol/L   CO2 24 22 - 32 mmol/L   Glucose, Bld 130 (H) 70 - 99 mg/dL   BUN 35 (H) 8 - 23 mg/dL   Creatinine, Ser 7.16 (H) 0.61 - 1.24 mg/dL   Calcium  8.3 (L) 8.9 - 10.3 mg/dL   Total Protein 5.4 (L) 6.5 - 8.1 g/dL   Albumin  2.7 (L) 3.5 - 5.0 g/dL   AST 18 15 - 41 U/L   ALT 11 0 - 44 U/L   Alkaline Phosphatase 30 (L) 38 - 126 U/L   Total Bilirubin 0.8 0.0 - 1.2 mg/dL   GFR, Estimated 23 (L) >60 mL/min   Anion gap 10 5 - 15  Glucose, capillary     Status: Abnormal   Collection Time: 06/02/24  8:01 AM  Result Value Ref Range   Glucose-Capillary 147 (H) 70 - 99 mg/dL  Glucose, capillary     Status: Abnormal   Collection Time: 06/02/24 11:34 AM  Result Value Ref Range   Glucose-Capillary 103 (H) 70 - 99 mg/dL    BMET Recent Labs    06/01/24 0408 06/02/24 0259  NA 139 139  K 3.6 3.6  CL 102 105  CO2 26 24  GLUCOSE 99 130*  BUN 40* 35*  CREATININE 2.95* 2.83*  CALCIUM  8.7* 8.3*   PT/INR No results for input(s): LABPROT, INR in the last 72 hours. ABG No results for input(s): PHART, HCO3 in the last 72 hours.  Invalid input(s): PCO2, PO2  Studies/Results: CT ABDOMEN PELVIS WO CONTRAST Result Date: 05/31/2024 EXAM: CT ABDOMEN AND PELVIS WITHOUT CONTRAST 05/31/2024 10:34:00 PM TECHNIQUE: CT of the abdomen and pelvis was performed without the administration of intravenous contrast. Multiplanar reformatted images are provided for review. Automated exposure control, iterative reconstruction, and/or  weight-based adjustment of the mA/kV was utilized to reduce the radiation dose to as low as reasonably achievable. COMPARISON: Abdominal radiograph 05/05/2017 and CT abdomen and pelvis 01/04/2006. CLINICAL HISTORY: Abdominal pain, acute, nonlocalized; P.o. intolerance. FINDINGS: LOWER CHEST: Small bilateral pleural effusions, greater on the right. Diffuse interstitial reticular nodular and alveolar infiltrates in the lung bases. This probably represents edema, possibly with underlying chronic interstitial lung disease. Pneumonia or aspiration could also have this pattern. Cardiac enlargement. LIVER: The liver is unremarkable. GALLBLADDER AND BILE DUCTS: Gallbladder is unremarkable. No biliary ductal dilatation. SPLEEN: No acute abnormality. PANCREAS: No acute abnormality. ADRENAL GLANDS: No acute abnormality. KIDNEYS, URETERS AND BLADDER: Right kidney: 9 mm stone in the mid right ureter at the level of L4 with prominent hydronephrosis and hydroureter and stranding around the right kidney. Distal right ureter is decompressed. Left kidney: 3.3 cm cyst on the left kidney. No imaging follow-up is indicated. Per consensus, no follow-up is needed for simple Bosniak type 1 and 2 renal cysts, unless the patient has a malignancy history or risk factors. Left kidney and left ureter are otherwise unremarkable. Urinary bladder is unremarkable. GI AND BOWEL: The stomach, small bowel, and colon are not abnormally distended. No wall thickening or inflammatory stranding is appreciated. The appendix is normal. There is no bowel obstruction. PERITONEUM AND RETROPERITONEUM: No ascites. No free air. VASCULATURE: Calcification of the aorta. Aorta is normal in caliber. No aneurysm. LYMPH NODES: No lymphadenopathy. REPRODUCTIVE ORGANS: The prostate gland is mildly enlarged. BONES AND SOFT TISSUES: Degenerative changes in the spine. No acute osseous abnormality. No focal soft tissue abnormality. IMPRESSION: 1. 9 mm stone in the mid right  ureter at the level of L4 with prominent hydronephrosis,  hydroureter, and perinephric stranding. Distal ureter is decompressed. 2. Small bilateral pleural effusions, greater on the right, with diffuse interstitial, reticulonodular, and alveolar infiltrates in the lung bases, which may reflect edema, chronic interstitial lung disease, pneumonia, or aspiration. 3. Cardiac enlargement. Electronically signed by: Elsie Gravely MD 05/31/2024 10:43 PM EST RP Workstation: [id number]   DG Chest 2 View Result Date: 05/31/2024 EXAM: 2 VIEW(S) XRAY OF THE CHEST 05/31/2024 08:17:00 PM COMPARISON: 05/30/2024 CLINICAL HISTORY: bradycardia bradycardia FINDINGS: LINES, TUBES AND DEVICES: Left AICD remains in place, unchanged. LUNGS AND PLEURA: Worsening interstitial opacities throughout the lungs, likely interstitial edema. No pleural effusion. No pneumothorax. HEART AND MEDIASTINUM: Cardiomegaly. Left AICD remains in place, unchanged. BONES AND SOFT TISSUES: No acute osseous abnormality. IMPRESSION: 1. Worsening interstitial opacities throughout the lungs, likely interstitial edema. 2. Cardiomegaly. Electronically signed by: Franky Crease MD 05/31/2024 08:21 PM EST RP Workstation: [id number]     Assessment/Plan: 9mm right proximal stone with obstruction and progressive AKI.   He needs cystoscopy with placement of right ureteral stent and then will need ureteroscopy or ESWL for management of the stone at a later date.   I have reviewed the risks of the procedure including bleeding, infection, ureteral injury, need for secondary procedures such as a nephrostomy tube, ureteroscopy or ESWL, thrombotic events and anesthetic complications.    He is on Plavix  and Lovenox  with his history of afib.  He would probably be set served with ureteroscopy for definitive stone management in the next few weeks.         No follow-ups on file.    CC: Dr. Lonni Dalton.      Christopher [name] 06/02/2024

## 2024-06-03 ENCOUNTER — Inpatient Hospital Stay (HOSPITAL_COMMUNITY): Admitting: Certified Registered"

## 2024-06-03 ENCOUNTER — Encounter (HOSPITAL_COMMUNITY): Payer: Self-pay | Admitting: Family Medicine

## 2024-06-03 ENCOUNTER — Inpatient Hospital Stay (HOSPITAL_COMMUNITY)

## 2024-06-03 ENCOUNTER — Encounter (HOSPITAL_COMMUNITY): Admission: EM | Disposition: A | Payer: Self-pay | Source: Home / Self Care | Attending: Family Medicine

## 2024-06-03 DIAGNOSIS — I130 Hypertensive heart and chronic kidney disease with heart failure and stage 1 through stage 4 chronic kidney disease, or unspecified chronic kidney disease: Secondary | ICD-10-CM | POA: Diagnosis not present

## 2024-06-03 DIAGNOSIS — N179 Acute kidney failure, unspecified: Secondary | ICD-10-CM | POA: Diagnosis not present

## 2024-06-03 DIAGNOSIS — I5022 Chronic systolic (congestive) heart failure: Secondary | ICD-10-CM

## 2024-06-03 DIAGNOSIS — E1122 Type 2 diabetes mellitus with diabetic chronic kidney disease: Secondary | ICD-10-CM

## 2024-06-03 DIAGNOSIS — N289 Disorder of kidney and ureter, unspecified: Secondary | ICD-10-CM

## 2024-06-03 DIAGNOSIS — I2510 Atherosclerotic heart disease of native coronary artery without angina pectoris: Secondary | ICD-10-CM | POA: Diagnosis not present

## 2024-06-03 DIAGNOSIS — N1330 Unspecified hydronephrosis: Secondary | ICD-10-CM | POA: Insufficient documentation

## 2024-06-03 DIAGNOSIS — E66811 Obesity, class 1: Secondary | ICD-10-CM | POA: Insufficient documentation

## 2024-06-03 DIAGNOSIS — N201 Calculus of ureter: Secondary | ICD-10-CM | POA: Diagnosis not present

## 2024-06-03 DIAGNOSIS — N1832 Chronic kidney disease, stage 3b: Secondary | ICD-10-CM

## 2024-06-03 DIAGNOSIS — D649 Anemia, unspecified: Secondary | ICD-10-CM | POA: Insufficient documentation

## 2024-06-03 HISTORY — PX: CYSTOSCOPY W/ URETERAL STENT PLACEMENT: SHX1429

## 2024-06-03 LAB — COMPREHENSIVE METABOLIC PANEL WITH GFR
ALT: 13 U/L (ref 0–44)
AST: 18 U/L (ref 15–41)
Albumin: 2.9 g/dL — ABNORMAL LOW (ref 3.5–5.0)
Alkaline Phosphatase: 28 U/L — ABNORMAL LOW (ref 38–126)
Anion gap: 9 (ref 5–15)
BUN: 32 mg/dL — ABNORMAL HIGH (ref 8–23)
CO2: 26 mmol/L (ref 22–32)
Calcium: 8.4 mg/dL — ABNORMAL LOW (ref 8.9–10.3)
Chloride: 106 mmol/L (ref 98–111)
Creatinine, Ser: 2.82 mg/dL — ABNORMAL HIGH (ref 0.61–1.24)
GFR, Estimated: 23 mL/min — ABNORMAL LOW (ref >60)
Glucose, Bld: 101 mg/dL — ABNORMAL HIGH (ref 70–99)
Potassium: 3.7 mmol/L (ref 3.5–5.1)
Sodium: 141 mmol/L (ref 135–145)
Total Bilirubin: 0.7 mg/dL (ref 0.0–1.2)
Total Protein: 5.8 g/dL — ABNORMAL LOW (ref 6.5–8.1)

## 2024-06-03 LAB — CBC
HCT: 32.8 % — ABNORMAL LOW (ref 39.0–52.0)
Hemoglobin: 11 g/dL — ABNORMAL LOW (ref 13.0–17.0)
MCH: 30.3 pg (ref 26.0–34.0)
MCHC: 33.5 g/dL (ref 30.0–36.0)
MCV: 90.4 fL (ref 80.0–100.0)
Platelets: 225 K/uL (ref 150–400)
RBC: 3.63 MIL/uL — ABNORMAL LOW (ref 4.22–5.81)
RDW: 14.2 % (ref 11.5–15.5)
WBC: 7.1 K/uL (ref 4.0–10.5)
nRBC: 0 % (ref 0.0–0.2)

## 2024-06-03 LAB — GLUCOSE, CAPILLARY
Glucose-Capillary: 106 mg/dL — ABNORMAL HIGH (ref 70–99)
Glucose-Capillary: 107 mg/dL — ABNORMAL HIGH (ref 70–99)
Glucose-Capillary: 115 mg/dL — ABNORMAL HIGH (ref 70–99)
Glucose-Capillary: 153 mg/dL — ABNORMAL HIGH (ref 70–99)
Glucose-Capillary: 139 mg/dL — ABNORMAL HIGH (ref 70–99)
Glucose-Capillary: 137 mg/dL — ABNORMAL HIGH (ref 70–99)

## 2024-06-03 LAB — LEGIONELLA PNEUMOPHILA SEROGP 1 UR AG: L. pneumophila Serogp 1 Ur Ag: NEGATIVE

## 2024-06-03 LAB — SURGICAL PCR SCREEN
MRSA, PCR: NEGATIVE
Staphylococcus aureus: NEGATIVE

## 2024-06-03 SURGERY — CYSTOSCOPY, WITH RETROGRADE PYELOGRAM AND URETERAL STENT INSERTION
Anesthesia: General | Site: Ureter | Laterality: Right

## 2024-06-03 MED ORDER — ONDANSETRON HCL 4 MG/2ML IJ SOLN
INTRAMUSCULAR | Status: DC | PRN
  Administered 2024-06-03: 4 mg via INTRAVENOUS

## 2024-06-03 MED ORDER — ACETAMINOPHEN 10 MG/ML IV SOLN
INTRAVENOUS | Status: DC | PRN
  Administered 2024-06-03: 1000 mg via INTRAVENOUS

## 2024-06-03 MED ORDER — CEFAZOLIN SODIUM-DEXTROSE 2-4 GM/100ML-% IV SOLN
INTRAVENOUS | Status: AC
  Filled 2024-06-03: qty 100

## 2024-06-03 MED ORDER — FENTANYL CITRATE (PF) 250 MCG/5ML IJ SOLN
INTRAMUSCULAR | Status: DC | PRN
  Administered 2024-06-03: 50 ug via INTRAVENOUS

## 2024-06-03 MED ORDER — ALBUTEROL SULFATE HFA 108 (90 BASE) MCG/ACT IN AERS
INHALATION_SPRAY | RESPIRATORY_TRACT | Status: DC | PRN
  Administered 2024-06-03 (×2): 2 via RESPIRATORY_TRACT

## 2024-06-03 MED ORDER — METOPROLOL TARTRATE 5 MG/5ML IV SOLN
INTRAVENOUS | Status: AC
  Filled 2024-06-03: qty 5

## 2024-06-03 MED ORDER — PROPOFOL 10 MG/ML IV BOLUS
INTRAVENOUS | Status: DC | PRN
  Administered 2024-06-03: 100 mg via INTRAVENOUS

## 2024-06-03 MED ORDER — PHENYLEPHRINE HCL-NACL 20-0.9 MG/250ML-% IV SOLN
INTRAVENOUS | Status: DC | PRN
  Administered 2024-06-03: 70 ug/min via INTRAVENOUS

## 2024-06-03 MED ORDER — INSULIN ASPART 100 UNIT/ML IJ SOLN: 0.0000 [IU] | INTRAMUSCULAR | Status: DC | PRN

## 2024-06-03 MED ORDER — SODIUM CHLORIDE 0.9 % IV SOLN: INTRAVENOUS | Status: DC

## 2024-06-03 MED ORDER — ROCURONIUM BROMIDE 10 MG/ML (PF) SYRINGE
PREFILLED_SYRINGE | INTRAVENOUS | Status: DC | PRN
Start: 2024-06-03 — End: 2024-06-03
  Administered 2024-06-03: 50 mg via INTRAVENOUS

## 2024-06-03 MED ORDER — IPRATROPIUM-ALBUTEROL 0.5-2.5 (3) MG/3ML IN SOLN
RESPIRATORY_TRACT | Status: AC
  Filled 2024-06-03: qty 3

## 2024-06-03 MED ORDER — ORAL CARE MOUTH RINSE: 15.0000 mL | Freq: Once | OROMUCOSAL | Status: AC

## 2024-06-03 MED ORDER — WATER FOR IRRIGATION, STERILE IR SOLN
Status: DC | PRN
  Administered 2024-06-03: 3000 mL

## 2024-06-03 MED ORDER — PHENYLEPHRINE 80 MCG/ML (10ML) SYRINGE FOR IV PUSH (FOR BLOOD PRESSURE SUPPORT)
PREFILLED_SYRINGE | INTRAVENOUS | Status: DC | PRN
  Administered 2024-06-03: 160 ug via INTRAVENOUS
  Administered 2024-06-03: 80 ug via INTRAVENOUS

## 2024-06-03 MED ORDER — CHLORHEXIDINE GLUCONATE 0.12 % MT SOLN
OROMUCOSAL | Status: AC
Start: 2024-06-03 — End: 2024-06-03
  Filled 2024-06-03: qty 15

## 2024-06-03 MED ORDER — EPHEDRINE SULFATE-NACL 50-0.9 MG/10ML-% IV SOSY
PREFILLED_SYRINGE | INTRAVENOUS | Status: DC | PRN
  Administered 2024-06-03: 5 mg via INTRAVENOUS

## 2024-06-03 MED ORDER — CHLORHEXIDINE GLUCONATE 0.12 % MT SOLN
15.0000 mL | Freq: Once | OROMUCOSAL | Status: AC
  Administered 2024-06-03: 15 mL via OROMUCOSAL

## 2024-06-03 MED ORDER — POTASSIUM CHLORIDE 20 MEQ PO PACK
40.0000 meq | PACK | Freq: Once | ORAL | Status: AC
  Administered 2024-06-03: 40 meq via ORAL
  Filled 2024-06-03: qty 2

## 2024-06-03 MED ORDER — FENTANYL CITRATE (PF) 100 MCG/2ML IJ SOLN: 25.0000 ug | INTRAMUSCULAR | Status: DC | PRN

## 2024-06-03 MED ORDER — LIDOCAINE 2% (20 MG/ML) 5 ML SYRINGE
INTRAMUSCULAR | Status: DC | PRN
Start: 2024-06-03 — End: 2024-06-03
  Administered 2024-06-03: 60 mg via INTRAVENOUS

## 2024-06-03 MED ORDER — IOHEXOL 300 MG/ML  SOLN
INTRAMUSCULAR | Status: DC | PRN
  Administered 2024-06-03: 17 mL via URETHRAL

## 2024-06-03 MED ORDER — METOPROLOL TARTRATE 5 MG/5ML IV SOLN
INTRAVENOUS | Status: DC | PRN
  Administered 2024-06-03: 5 mg via INTRAVENOUS

## 2024-06-03 MED ORDER — DEXAMETHASONE SOD PHOSPHATE PF 10 MG/ML IJ SOLN
INTRAMUSCULAR | Status: DC | PRN
  Administered 2024-06-03: 10 mg via INTRAVENOUS

## 2024-06-03 MED ORDER — ALBUMIN HUMAN 5 % IV SOLN: INTRAVENOUS | Status: DC | PRN

## 2024-06-03 MED ORDER — SUGAMMADEX SODIUM 200 MG/2ML IV SOLN
INTRAVENOUS | Status: DC | PRN
  Administered 2024-06-03: 200 mg via INTRAVENOUS
  Administered 2024-06-03: 100 mg via INTRAVENOUS

## 2024-06-03 MED ORDER — FENTANYL CITRATE (PF) 100 MCG/2ML IJ SOLN
INTRAMUSCULAR | Status: AC
  Filled 2024-06-03: qty 2

## 2024-06-03 MED ORDER — MIDAZOLAM HCL 2 MG/2ML IJ SOLN
INTRAMUSCULAR | Status: AC
Start: 2024-06-03 — End: 2024-06-03
  Filled 2024-06-03: qty 2

## 2024-06-03 MED ORDER — PROPOFOL 500 MG/50ML IV EMUL
INTRAVENOUS | Status: DC | PRN
  Administered 2024-06-03: 115 ug/kg/min via INTRAVENOUS

## 2024-06-03 MED ORDER — KETAMINE HCL 50 MG/5ML IJ SOSY
PREFILLED_SYRINGE | INTRAMUSCULAR | Status: AC
Start: 2024-06-03 — End: 2024-06-03
  Filled 2024-06-03: qty 5

## 2024-06-03 MED ORDER — ACETAMINOPHEN 10 MG/ML IV SOLN
INTRAVENOUS | Status: AC
  Filled 2024-06-03: qty 100

## 2024-06-03 MED ORDER — DROPERIDOL 2.5 MG/ML IJ SOLN: 0.6250 mg | Freq: Once | INTRAMUSCULAR | Status: DC | PRN

## 2024-06-03 SURGICAL SUPPLY — 19 items
BAG DRAIN URO-CYSTO SKYTR STRL (DRAIN) ×1 IMPLANT
BAG URINE DRAIN 2000ML AR STRL (UROLOGICAL SUPPLIES) ×1 IMPLANT
CATH FOLEY 2WAY SLVR 5CC 16FR (CATHETERS) IMPLANT
CATH URETL OPEN END 6FR 70 (CATHETERS) ×1 IMPLANT
GLOVE BIO SURGEON STRL SZ7.5 (GLOVE) ×1 IMPLANT
GOWN STRL REUS W/ TWL LRG LVL3 (GOWN DISPOSABLE) ×1 IMPLANT
GOWN STRL REUS W/ TWL XL LVL3 (GOWN DISPOSABLE) ×1 IMPLANT
GUIDEWIRE ANG ZIPWIRE 038X150 (WIRE) IMPLANT
GUIDEWIRE STR DUAL SENSOR (WIRE) ×1 IMPLANT
KIT TURNOVER KIT B (KITS) ×1 IMPLANT
MANIFOLD NEPTUNE II (INSTRUMENTS) ×1 IMPLANT
PACK CYSTO (CUSTOM PROCEDURE TRAY) ×1 IMPLANT
SOLN 0.9% NACL POUR BTL 1000ML (IV SOLUTION) ×1 IMPLANT
STENT URET 6FRX24 CONTOUR (STENTS) IMPLANT
STENT URET 6FRX26 CONTOUR (STENTS) IMPLANT
SYPHON OMNI JUG (MISCELLANEOUS) ×1 IMPLANT
TOWEL GREEN STERILE FF (TOWEL DISPOSABLE) ×1 IMPLANT
TUBE CONNECTING 12X1/4 (SUCTIONS) IMPLANT
WATER STERILE IRR 3000ML UROMA (IV SOLUTION) ×1 IMPLANT

## 2024-06-03 NOTE — Assessment & Plan Note (Addendum)
 Probable acute on chronic systolic CHF Paroxysmal atrial fibrillation Early this year, noted to have EF down on routine echo.  [facility] monitoring showed 14% PVC burden and this was the suspected culprit and mexiletine was ordered in May.  Med dispense history suggests he started this late July.    After starting mexiletine, started to have persistent nausea, dose was reduced in Sept, but nausea continued so it was stopped.  Via ICD interrogation earlier this month, it was noted that he had gone back into Afib on 05/14/24.    Finally presented to the hospital, noted to have elevated bNP and bilateral infiltrates and small bilateral effusions.  Given AKI, diuretics were held.  - Hold Farxiga  furosemide  spironolactone , Entresto  - Stop mexiletine - Continue metoprolol , amiodarone  -Telemetry - Keep K greater than 4, mag greater than 2

## 2024-06-03 NOTE — Assessment & Plan Note (Addendum)
 Cr 1.2 until Feb this year. Worsening CHF at that time, started GDMT, since then, appears baseline has drifted up to 1.5-2.     Cr here 3.3 on admission.  CT showed right obstructive stone.  Diuretics and Entresto  held.  Given IV fluids and Cr slightly better but not resolved and so Urology consulted for decompression.     Now stent placed 11/30 but less than expected improvement overnight.  Making good UOP. - Start IV Lasix  - Hold Farxiga , metformin  for now - Continue to hold Entresto , spironolactone  for now - Trend Cr - Avoid nephrotoxins

## 2024-06-03 NOTE — Anesthesia Postprocedure Evaluation (Signed)
 Anesthesia Post Note  Patient: Christopher [name]  Procedure(s) Performed: CYSTOSCOPY, WITH RETROGRADE PYELOGRAM AND URETERAL STENT INSERTION (Right: Ureter)     Patient location during evaluation: PACU Anesthesia Type: General Level of consciousness: awake and alert Pain management: pain level controlled Vital Signs Assessment: post-procedure vital signs reviewed and stable Respiratory status: spontaneous breathing, nonlabored ventilation, respiratory function stable and patient connected to nasal cannula oxygen Cardiovascular status: blood pressure returned to baseline and stable Postop Assessment: no apparent nausea or vomiting Anesthetic complications: no   No notable events documented.  Last Vitals:  Vitals:   06/03/24 1012 06/03/24 1042  BP: 112/76 113/66  Pulse: 89 79  Resp: (!) 23 18  Temp: 36.7 C 36.5 C  SpO2: 96% 98%    Last Pain:  Vitals:   06/03/24 1000  TempSrc:   PainSc: 0-No pain                 Cordella P Adylee Leonardo

## 2024-06-03 NOTE — Assessment & Plan Note (Addendum)
 Right hydro on admission due to 9mm stone.  Urology consulted, took to OR for stent on 11/29.  Tolerating well - Outpatient urology follow up for stent removal needed with Dr. Watt or Carolee

## 2024-06-03 NOTE — Anesthesia Preprocedure Evaluation (Addendum)
 Anesthesia Evaluation  Patient identified by MRN, date of birth, ID band Patient awake    Reviewed: Allergy & Precautions, NPO status , Patient's Chart, lab work & pertinent test results  History of Anesthesia Complications (+) PONV and history of anesthetic complications  Airway Mallampati: II  TM Distance: >3 FB Neck ROM: Full    Dental no notable dental hx.    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 Left ventricular ejection fraction, by estimation, is 30 to 35%. The left ventricle has moderately decreased function. The left ventricle demonstrates global hypokinesis. The left ventricular internal cavity size was moderately dilated. Left ventricular diastolic function could not be evaluated. Elevated left atrial pressure. The E/e' is 27.  2. Right ventricular systolic function is mildly reduced. The right ventricular size is normal. Mildly increased right ventricular wall thickness. There is severely elevated pulmonary artery systolic pressure. The estimated right ventricular systolic pressure is 68.0 mmHg.  3. Left atrial size was mildly dilated.  4. Right atrial size was mildly dilated.  5. S/P 30mm Sorin Memo 3D Annuloplasty Ring, implanted 09/27/2012, no mitral stenosis (MG at HR 89bpm), mild mitral regurgitation.  6. The aortic valve is tricuspid. Aortic valve regurgitation is mild. Aortic valve sclerosis is present, with no evidence of aortic valve stenosis.  7. The inferior vena cava is normal in size with greater than 50% respiratory variability, suggesting right atrial pressure of 3 mmHg.   Comparison(s): A prior study was performed on 08/12/2023. LVEF 30-35%, s/p  mitral annuloplasty (MG 3 mmHG at HR 98bpm).    Neuro/Psych CVA  negative psych ROS   GI/Hepatic Neg liver ROS,GERD  ,,  Endo/Other  diabetes, Type 2, Insulin  Dependent    Renal/GU Renal disease  negative genitourinary   Musculoskeletal  (+) Arthritis , Osteoarthritis,    Abdominal Normal abdominal exam  (+)   Peds  Hematology   Anesthesia Other Findings   Reproductive/Obstetrics                              Anesthesia Physical Anesthesia Plan  ASA: 4  Anesthesia Plan: General   Post-op Pain Management:    Induction: Intravenous  PONV Risk Score and Plan: 3 and Ondansetron , Dexamethasone  and Treatment may vary due to age or medical condition  Airway Management Planned: Mask and LMA  Additional Equipment: None  Intra-op Plan:   Post-operative Plan: Extubation in OR  Informed Consent: I have reviewed the patients History and Physical, chart, labs and discussed the procedure including the risks, benefits and alternatives for the proposed anesthesia with the patient or authorized representative who has indicated his/her understanding and acceptance.     Dental advisory given  Plan Discussed with: CRNA  Anesthesia Plan Comments:          Anesthesia Quick Evaluation

## 2024-06-03 NOTE — Assessment & Plan Note (Signed)
 Glucose normal -Hold metformin  -Continue sliding scale corrections

## 2024-06-03 NOTE — Op Note (Signed)
 Operative Note  Preoperative diagnosis:  1.  Right ureteral calculus with acute renal insufficiency  Post operative diagnosis: 1.  Right ureteral calculus with acute renal insufficiency  Procedure(s): 1.  Cystoscopy with right retrograde pyelogram and right ureteral stent placement  Surgeon: Sherwood Edison, MD  Assistants: None  Anesthesia: General  Complications: None immediate  EBL: Minimal  Specimens: 1.  None  Drains/Catheters: 1.  6 X 26 double-J ureteral stent  Intraoperative findings: 1.  Normal urethra and bladder 2.  Right retrograde pyelogram revealed a filling defect at the level of the stone with upstream hydroureteronephrosis.  There was filling defect in the renal pelvis after retrograde pyelogram that may represent retropulsion of the stone into the renal pelvis  Indication: 73 year old male with a right ureteral calculus and renal insufficiency that failed to improve with medical management.  Presents for ureteral stent placement.  Description of procedure:  The patient was identified and consent was obtained.  The patient was taken to the operating room and placed in the supine position.  The patient was placed under general anesthesia.  Perioperative antibiotics were administered.  The patient was placed in dorsal lithotomy.  Patient was prepped and draped in a standard sterile fashion and a timeout was performed.  A 21 French rigid cystoscope was advanced into the urethra and into the bladder.  The right distal most portion of the ureter was cannulated with an open-ended ureteral catheter.  Retrograde pyelogram was performed with the findings noted above.  A sensor wire was then advanced up to the kidney under fluoroscopic guidance.  A 6 X right double-J ureteral stent was advanced up to the kidney under fluoroscopic guidance.  The wire was withdrawn and fluoroscopy confirmed good proximal placement and direct visualization confirmed a good coil within the bladder.   The bladder was drained and the scope withdrawn.  This concluded the operation.  Patient tolerated procedure well and was stable postoperatively.  Plan: Follow-up in 1 to 2 weeks for ureteroscopy

## 2024-06-03 NOTE — Transfer of Care (Signed)
 Immediate Anesthesia Transfer of Care Note  Patient: Christopher [name]  Procedure(s) Performed: CYSTOSCOPY, WITH RETROGRADE PYELOGRAM AND URETERAL STENT INSERTION (Right: Ureter)  Patient Location: PACU  Anesthesia Type:General  Level of Consciousness: awake, alert , and oriented  Airway & Oxygen Therapy: Patient Spontanous Breathing and Patient connected to face mask oxygen  Post-op Assessment: Report given to RN, Post -op Vital signs reviewed and stable, and Patient moving all extremities X 4  Post vital signs: Reviewed and stable  Last Vitals:  Vitals Value Taken Time  BP 102/58 06/03/24 09:45  Temp    Pulse 82 06/03/24 09:48  Resp 23 06/03/24 09:48  SpO2 96 % 06/03/24 09:48  Vitals shown include unfiled device data.  Last Pain:  Vitals:   06/03/24 0735  TempSrc: Oral  PainSc: 0-No pain      Patients Stated Pain Goal: 0 (06/02/24 1040)  Complications: No notable events documented.

## 2024-06-03 NOTE — Progress Notes (Signed)
 PT Cancellation Note  Patient Details Name: Christopher [name] MRN: [id number] DOB: 13-Jun-1951   Cancelled Treatment:    Reason Eval/Treat Not Completed: Patient at procedure or test/unavailable. Acute PT to re-attempt as schedule allows.  Kate ORN, PT, DPT Secure Chat Preferred  Rehab Office [phone]    Kate BRAVO Wendolyn 06/03/2024, 9:04 AM

## 2024-06-03 NOTE — Plan of Care (Signed)
   Problem: Education: Goal: Ability to describe self-care measures that may prevent or decrease complications (Diabetes Survival Skills Education) will improve Outcome: Progressing   Problem: Metabolic: Goal: Ability to maintain appropriate glucose levels will improve Outcome: Progressing   Problem: Nutritional: Goal: Maintenance of adequate nutrition will improve Outcome: Progressing

## 2024-06-03 NOTE — Anesthesia Procedure Notes (Signed)
 Procedure Name: Intubation Date/Time: 06/03/2024 8:57 AM  Performed by: Mollie Olivia SAUNDERS, CRNAPre-anesthesia Checklist: Patient identified, Emergency Drugs available, Suction available and Patient being monitored Patient Re-evaluated:Patient Re-evaluated prior to induction Oxygen Delivery Method: Circle system utilized Preoxygenation: Pre-oxygenation with 100% oxygen Induction Type: IV induction Ventilation: Mask ventilation without difficulty Laryngoscope Size: Glidescope and 4 Grade View: Grade II Tube type: Oral Tube size: 7.5 mm Number of attempts: 1 Airway Equipment and Method: Oral airway, Rigid stylet and Video-laryngoscopy Placement Confirmation: ETT inserted through vocal cords under direct vision, positive ETCO2 and breath sounds checked- equal and bilateral Secured at: 23 cm Tube secured with: Tape Dental Injury: Teeth and Oropharynx as per pre-operative assessment  Difficulty Due To: Difficulty was anticipated, Difficult Airway- due to anterior larynx, Difficult Airway- due to large tongue and Difficult Airway- due to immobile epiglottis

## 2024-06-03 NOTE — Assessment & Plan Note (Signed)
 Hypertension BP soft normal - Hold Farxiga , furosemide , Entresto , spironolactone  - Continue metoprolol  - Avoid further hypotension - Continue Plavix  and fenofibrate  - Hold Lipitor for now

## 2024-06-03 NOTE — Assessment & Plan Note (Signed)
 No bleeding reported or observed, likely due to chronic kidney disease

## 2024-06-03 NOTE — Interval H&P Note (Signed)
 History and Physical Interval Note:  06/03/2024 8:27 AM  Christopher [name]  has presented today for surgery, with the diagnosis of RIGHT URETERAL STONE W/ OBSTRUCTION.  The various methods of treatment have been discussed with the patient and family. After consideration of risks, benefits and other options for treatment, the patient has consented to  Procedure(s): CYSTOSCOPY, WITH RETROGRADE PYELOGRAM AND URETERAL STENT INSERTION (Right) as a surgical intervention.  The patient's history has been reviewed, patient examined, no change in status, stable for surgery.  I have reviewed the patient's chart and labs.  Questions were answered to the patient's satisfaction.     Sherwood JONETTA Edison, III

## 2024-06-03 NOTE — Progress Notes (Signed)
 Progress Note   Patient: Christopher [name] FMW:[id number] DOB: 1950/07/16 DOA: 05/31/2024     3 DOS: the patient was seen and examined on 06/03/2024 at 11:11AM      Brief hospital course: 73 y.o. 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  Evidently feeling unwell for several weeks, malaise, fatigue, nausea and cough. Possibly since starting mexiletine.  Had an echo that showed EF stable at 30-35%.           Assessment and Plan: * AKI on CkD IIIb Cr 1.2 until Feb this year with worsening CHF, and start of GDMT, since then, appears baseline has drifted up to 1.5-2.     Cr here 3.3 on admission.  CT showed right obstructive stone.  Diuretics and Entresto  held.  Given IV fluids and Cr slightly better but not resolved and so Urology consulted for decompression.    - Hold Farxiga , Lasix , metformin , Entresto , spironolactone  - To OR today for ureteral stent - Trend Cr - Avoid nephrotoxins - If Cr improves tomorrow, will start IV Lasix  for #2     Chronic systolic heart failure (HCC) Probable acute on chronic systolic CHF Paroxysmal atrial fibrillation Early this year, noted to have EF down on routine echo.  [facility] Heart monitoring showed 14% PVC burden and this was the suspected culprit and mexiletine was ordered in May.  Med dispense history suggests he started this late July.    After starting mexiletine, started to have persistent nausea, dose was reduced in Sept, but nausea continued so it was stopped.  Via ICD interrogation earlier this month, it was noted that he had gone back into Afib on 05/14/24.    Finally presented to the hospital, noted to have elevated bNP and bilateral infiltrates and small bilateral effusions.  Given AKI, diuretics were held.  - Hold Farxiga  furosemide  spironolactone ,  Entresto  - Stop mexiletine - Continue metoprolol , amiodarone  -Telemetry - Keep K greater than 4, mag greater than 2    Diabetes mellitus type 2 with complications (HCC) Glucose normal -Hold metformin  -Continue sliding scale corrections  OSA on CPAP - CPAP at night  Right hydronephrosis due to ureteral stone - Consult urology - To the OR today for ureteral stent - Trend BMP  Coronary artery disease involving native coronary artery of native heart without angina pectoris Hypertension BP soft normal - Hold Farxiga , furosemide , Entresto , spironolactone  - Continue metoprolol  - Avoid further hypotension - Continue Plavix  and fenofibrate  - Hold Lipitor for now          Subjective: Patient still feels tired, he said no fever, confusion, vomiting.  Heart rate still irregular and abnormal.     Physical Exam: BP 113/66 (BP Location: Left Arm)   Pulse 79   Temp 97.7 F (36.5 C)   Resp 18   Ht 5' 10 (1.778 m)   Wt 108.4 kg   SpO2 98%   BMI 34.29 kg/m   Adult male, sitting up in bed, interactive and appropriate Rate controlled, irregular, no murmurs, no peripheral edema, JVP not visible Respiratory rate normal, lungs with rales bilaterally at the bases, no wheezing, respiratory effort appears normal Abdomen slightly distended, no tenseness or fluid wave Attention normal, affect appropriate, judgment and insight appear normal    Data Reviewed: Creatinine stable at 2.8, electrolytes  otherwise normal CBC shows no leukocytosis, stable anemia Discussed with urology   Family Communication: Girlfriend at the bedside    Disposition: Status is: Inpatient         Author: Lonni SHAUNNA Dalton, MD 06/03/2024 2:11 PM  For on call review [email or web address].

## 2024-06-03 NOTE — Assessment & Plan Note (Signed)
 -  CPAP at night

## 2024-06-03 NOTE — Evaluation (Signed)
 Physical Therapy Evaluation Patient Details Name: Christopher [name] MRN: [id number] DOB: 03-05-1951 Today's Date: 06/03/2024  History of Present Illness  73 y.o. male presents to [facility] on 05/31/2024 after having abnormal labs at PCP. Pt admitted for management of AKI. Also with R ureteral calculus. 11/29 cystoscopy with R retrograde pyelogram and R uretal stent placement. PMH includes HTN, MR, HLD, afib, CHF, CAD, DMII, CKD.   Clinical Impression  Pt received ambulating in the room and agreeable to PT session. Pt presents with slight LLE weakness (hx of cva), impaired dynamic balance, and impaired gait pattern. Pt reported being at baseline for mobility as he was ModI with no AD throughout. Pt scored a 17/24 on the DGI indicating that pt is at a higher risk of falling. Lower score related to impaired gait pattern and slight veering when performing head turns. Pt had no losses of balance or unsteadiness throughout. Recommending OP PT to address balance deficits with pt in agreement. Pt will have intermittent assist available upon d/c home. No further acute PT needs with acute PT signing off. Please re-consult if there is a change in status.         If plan is discharge home, recommend the following: Assist for transportation;Help with stairs or ramp for entrance   Can travel by private vehicle    Yes    Equipment Recommendations None recommended by PT     Functional Status Assessment Patient has not had a recent decline in their functional status     Precautions / Restrictions Precautions Precautions: Fall Recall of Precautions/Restrictions: Intact Restrictions Weight Bearing Restrictions Per Provider Order: No      Mobility  Bed Mobility  General bed mobility comments: NT, pt ambulating upon arrival and returned to seated on EOB    Transfers Overall transfer level: Modified independent Equipment used: None       Ambulation/Gait Ambulation/Gait assistance: Modified  independent (Device/Increase time) Gait Distance (Feet): 250 Feet Assistive device: None Gait Pattern/deviations: Step-through pattern, Drifts right/left Gait velocity: decr     General Gait Details: Symmetrical step length with increased truncal sway to the right. Would occasionally reach for support from hallway rail. Veers left/right when performing head turns    Balance Overall balance assessment: Needs assistance, Mild deficits observed, not formally tested Sitting-balance support: No upper extremity supported, Feet supported Sitting balance-Leahy Scale: Good     Standing balance support: No upper extremity supported Standing balance-Leahy Scale: Fair    Standardized Balance Assessment Standardized Balance Assessment : Dynamic Gait Index   Dynamic Gait Index Level Surface: Mild Impairment Change in Gait Speed: Mild Impairment Gait with Horizontal Head Turns: Mild Impairment Gait with Vertical Head Turns: Mild Impairment Gait and Pivot Turn: Normal Step Over Obstacle: Mild Impairment Step Around Obstacles: Normal Steps: Moderate Impairment Total Score: 17       Pertinent Vitals/Pain Pain Assessment Pain Assessment: No/denies pain    Home Living Family/patient expects to be discharged to:: Private residence Living Arrangements: Alone Available Help at Discharge: Friend(s) Type of Home: Other(Comment) (condo) Home Access: Level entry    Home Layout: One level Home Equipment: Agricultural Consultant (2 wheels);Cane - single point      Prior Function Prior Level of Function : Independent/Modified Independent    Mobility Comments: amb in home with no AD, uses RW to go to mailbox ADLs Comments: orders groceries online they drop them off and he gets them from the porch. Ind     Extremity/Trunk Assessment   Upper  Extremity Assessment Upper Extremity Assessment: Overall WFL for tasks assessed    Lower Extremity Assessment Lower Extremity Assessment: LLE  deficits/detail LLE Deficits / Details: Grossly 4+/5 LLE Sensation: WNL    Cervical / Trunk Assessment Cervical / Trunk Assessment: Normal  Communication   Communication Communication: No apparent difficulties    Cognition Arousal: Alert Behavior During Therapy: WFL for tasks assessed/performed   PT - Cognitive impairments: No apparent impairments    Following commands: Intact       Cueing Cueing Techniques: Verbal cues      PT Assessment All further PT needs can be met in the next venue of care  PT Problem List Decreased strength;Decreased activity tolerance;Decreased balance;Decreased mobility           PT Goals (Current goals can be found in the Care Plan section)  Acute Rehab PT Goals PT Goal Formulation: All assessment and education complete, DC therapy     AM-PAC PT 6 Clicks Mobility  Outcome Measure Help needed turning from your back to your side while in a flat bed without using bedrails?: None Help needed moving from lying on your back to sitting on the side of a flat bed without using bedrails?: None Help needed moving to and from a bed to a chair (including a wheelchair)?: None Help needed standing up from a chair using your arms (e.g., wheelchair or bedside chair)?: None Help needed to walk in hospital room?: None Help needed climbing 3-5 steps with a railing? : A Little 6 Click Score: 23    End of Session   Activity Tolerance: Patient tolerated treatment well Patient left: in bed;with call bell/phone within reach Nurse Communication: Mobility status PT Visit Diagnosis: Other abnormalities of gait and mobility (R26.89);Muscle weakness (generalized) (M62.81);Unsteadiness on feet (R26.81)    Time: 8663-8650 PT Time Calculation (min) (ACUTE ONLY): 13 min   Charges:   PT Evaluation $PT Eval Low Complexity: 1 Low   PT General Charges $$ ACUTE PT VISIT: 1 Visit        Kate ORN, PT, DPT Secure Chat Preferred  Rehab Office  [phone]   Kate BRAVO Wendolyn 06/03/2024, 2:40 PM

## 2024-06-04 ENCOUNTER — Encounter (HOSPITAL_COMMUNITY): Payer: Self-pay | Admitting: Urology

## 2024-06-04 DIAGNOSIS — I2510 Atherosclerotic heart disease of native coronary artery without angina pectoris: Secondary | ICD-10-CM | POA: Diagnosis not present

## 2024-06-04 DIAGNOSIS — I5023 Acute on chronic systolic (congestive) heart failure: Secondary | ICD-10-CM

## 2024-06-04 DIAGNOSIS — I4819 Other persistent atrial fibrillation: Secondary | ICD-10-CM

## 2024-06-04 DIAGNOSIS — N179 Acute kidney failure, unspecified: Secondary | ICD-10-CM | POA: Diagnosis not present

## 2024-06-04 DIAGNOSIS — N183 Chronic kidney disease, stage 3 unspecified: Secondary | ICD-10-CM

## 2024-06-04 DIAGNOSIS — N1832 Chronic kidney disease, stage 3b: Secondary | ICD-10-CM | POA: Diagnosis not present

## 2024-06-04 DIAGNOSIS — N170 Acute kidney failure with tubular necrosis: Secondary | ICD-10-CM | POA: Diagnosis not present

## 2024-06-04 LAB — COMPREHENSIVE METABOLIC PANEL WITH GFR
ALT: 12 U/L (ref 0–44)
AST: 18 U/L (ref 15–41)
Albumin: 2.9 g/dL — ABNORMAL LOW (ref 3.5–5.0)
Alkaline Phosphatase: 26 U/L — ABNORMAL LOW (ref 38–126)
Anion gap: 9 (ref 5–15)
BUN: 31 mg/dL — ABNORMAL HIGH (ref 8–23)
CO2: 21 mmol/L — ABNORMAL LOW (ref 22–32)
Calcium: 8.3 mg/dL — ABNORMAL LOW (ref 8.9–10.3)
Chloride: 106 mmol/L (ref 98–111)
Creatinine, Ser: 2.73 mg/dL — ABNORMAL HIGH (ref 0.61–1.24)
GFR, Estimated: 24 mL/min — ABNORMAL LOW (ref >60)
Glucose, Bld: 126 mg/dL — ABNORMAL HIGH (ref 70–99)
Potassium: 3.7 mmol/L (ref 3.5–5.1)
Sodium: 136 mmol/L (ref 135–145)
Total Bilirubin: 0.7 mg/dL (ref 0.0–1.2)
Total Protein: 5.6 g/dL — ABNORMAL LOW (ref 6.5–8.1)

## 2024-06-04 LAB — CBC
HCT: 30.2 % — ABNORMAL LOW (ref 39.0–52.0)
Hemoglobin: 10 g/dL — ABNORMAL LOW (ref 13.0–17.0)
MCH: 29.9 pg (ref 26.0–34.0)
MCHC: 33.1 g/dL (ref 30.0–36.0)
MCV: 90.4 fL (ref 80.0–100.0)
Platelets: 206 K/uL (ref 150–400)
RBC: 3.34 MIL/uL — ABNORMAL LOW (ref 4.22–5.81)
RDW: 14.3 % (ref 11.5–15.5)
WBC: 7.6 K/uL (ref 4.0–10.5)
nRBC: 0 % (ref 0.0–0.2)

## 2024-06-04 LAB — GLUCOSE, CAPILLARY
Glucose-Capillary: 159 mg/dL — ABNORMAL HIGH (ref 70–99)
Glucose-Capillary: 96 mg/dL (ref 70–99)
Glucose-Capillary: 93 mg/dL (ref 70–99)
Glucose-Capillary: 113 mg/dL — ABNORMAL HIGH (ref 70–99)

## 2024-06-04 MED ORDER — CARMEX CLASSIC LIP BALM EX OINT
1.0000 | TOPICAL_OINTMENT | CUTANEOUS | Status: DC | PRN
  Administered 2024-06-04: 1 via TOPICAL
  Filled 2024-06-04: qty 10

## 2024-06-04 MED ORDER — SODIUM CHLORIDE 0.9% FLUSH: 3.0000 mL | INTRAVENOUS | Status: DC | PRN

## 2024-06-04 MED ORDER — HEPARIN (PORCINE) 25000 UT/250ML-% IV SOLN
1400.0000 [IU]/h | INTRAVENOUS | Status: DC
  Administered 2024-06-04: 1400 [IU]/h via INTRAVENOUS
  Filled 2024-06-04: qty 250

## 2024-06-04 MED ORDER — SODIUM CHLORIDE 0.9 % IV SOLN: 250.0000 mL | INTRAVENOUS | Status: DC | PRN

## 2024-06-04 MED ORDER — SODIUM CHLORIDE 0.9% FLUSH
3.0000 mL | Freq: Two times a day (BID) | INTRAVENOUS | Status: DC
  Administered 2024-06-05: 3 mL via INTRAVENOUS

## 2024-06-04 MED ORDER — HEPARIN BOLUS VIA INFUSION
3000.0000 [IU] | Freq: Once | INTRAVENOUS | Status: AC
  Administered 2024-06-04: 3000 [IU] via INTRAVENOUS
  Filled 2024-06-04: qty 3000

## 2024-06-04 MED ORDER — FUROSEMIDE 10 MG/ML IJ SOLN
40.0000 mg | Freq: Two times a day (BID) | INTRAMUSCULAR | Status: DC
  Administered 2024-06-04 – 2024-06-05 (×4): 40 mg via INTRAVENOUS
  Filled 2024-06-04 (×4): qty 4

## 2024-06-04 NOTE — Progress Notes (Signed)
 Progress Note   Patient: Christopher [name] FMW:[id number] DOB: 1951/02/07 DOA: 05/31/2024     4 DOS: the patient was seen and examined on 06/04/2024        Brief hospital course: 73 y.o. 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  Evidently feeling unwell for several weeks, malaise, fatigue, nausea and cough. Possibly since starting mexiletine.  Had an echo that showed EF stable at 30-35%.           Assessment and Plan: * AKI on CkD IIIb Cr 1.2 until Feb this year with worsening CHF, and start of GDMT, since then, appears baseline has drifted up to 1.5-2.     Cr here 3.3 on admission.  CT showed right obstructive stone.  Diuretics and Entresto  held.  Given IV fluids and Cr slightly better but not resolved and so Urology consulted for decompression.    - Hold Farxiga , Lasix , metformin , Entresto , spironolactone  - To OR today for ureteral stent - Trend Cr - Avoid nephrotoxins - If Cr improves tomorrow, will start IV Lasix  for #2     Chronic systolic heart failure (HCC) Probable acute on chronic systolic CHF Paroxysmal atrial fibrillation Feb of this year, admitted for CHF, noted to have EF down 45%->30-35%.  LHC showed nonobstructive disease only.   Subsequent [facility] Heart monitoring showed 14% PVC burden and this was the suspected culprit and mexiletine was ordered in May.  Med dispense history suggests he started this late July.    After starting mexiletine, started to have persistent nausea, dose was reduced in Sept, but nausea continued so it was stopped 11/9.  Via ICD interrogation earlier this month, it was noted that he had gone back into Afib on 05/14/24.    Here, he was noted to have elevated BNP and bilateral infiltrates and small bilateral effusions as well as ascites.    Given history  of poor oral intake, given fluids with marginal improvement in renal function. Given poor response to fluids, ureteral stent placed to relieve obstruction, but still only marginal imrpovement in CrCl.  Will start diuretics to decongest kidneys.  Given persistent rales on exam, perssistent Afib and concern for arrhythmogenic Cardiomyopathy/CHF, will consult Cardiology - Start IV Lasix  - Strict I/Os - Daily weights - Hold Farxiga , spironolactone , Entresto  - Stop mexiletine - Continue metoprolol , amiodarone  - Telemetry -  Keep K greater than 4, mag greater than 2   Nausea Patient is unclear historian, seems to have persistent nausea since September.  Unclear if this is from mexiletine or CHF.  No other GI symptoms.  Mild ascites noted.   Diabetes mellitus type 2 with complications (HCC) Glucose controlled - Continue sliding scale corrections - Hold metformin   OSA on CPAP -CPAP at night  Normocytic anemia Hemoglobin stable  Right hydronephrosis due to ureteral stone Stented 11/29 - Needs outpatient urology follow-up  Coronary artery disease involving native coronary artery of native heart without angina pectoris Hypertension BP improving - Start Lasix  - Hold Farxiga , Entresto , spironolactone  for now - Continue metoprolol  - Continue Plavix  and fenofibrate  - Hold Lipitor  Class I obesity         Subjective: Patient feeling somewhat better, no pain from his ureteral stent.  Still short of breath with exertion.     Physical Exam: BP ROLLEN)  135/111 (BP Location: Left Arm)   Pulse (!) 108   Temp 98.3 F (36.8 C)   Resp 18   Ht 5' 10 (1.778 m)   Wt 108.4 kg   SpO2 95%   BMI 34.29 kg/m   Obese adult male, sitting up in bed, interactive and appropriate RRR, heart sounds distant, no pitting edema in the lower extremities Respiratory rate seems normal at rest, rales bilaterally in the bases, no wheezing Abdomen soft, no tenderness palpation or guarding, mild  ascites noted, note not tense Attention normal, affect normal, judgment and insight appear normal, face symmetric, speech fluent, moves all extremities with normal strength and coordination     Data Reviewed: Basic metabolic panel shows minimal change in creatinine, BUN still 31 CBC shows no leukocytosis, mild anemia    Family Communication:     Disposition: Status is: Inpatient         Author: Lonni SHAUNNA Dalton, MD 06/04/2024 11:13 AM  For on call review [email or web address].

## 2024-06-04 NOTE — H&P (View-Only) (Signed)
 Cardiology Consultation   Patient ID: [name] MRN: [id number]; DOB: August 05, 1950  Admit date: 05/31/2024 Date of Consult: 06/04/2024  PCP:  [name], MD   [facility] Providers Cardiologist:  Vinie JAYSON Maxcy, MD  Electrophysiologist:  Soyla Lunger Norton, MD       Patient Profile: Christopher [name] is a 73 y.o. 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 Jonel.  History of Present Illness: Christopher [name] is a 73 year old male with the above history who is followed by Dr. Skeeter and Dr. Norton.  He has a history of CAD with remote PCI with BMS to RCA in 2014.  He then underwent a mitral valve repair in 09/2022 due to subacute endocarditis.  He had postop atrial fibrillation but was unable to tolerate Eliquis  due to headaches and Xarelto  due to a spontaneous right thigh hematoma.  A loop recorder was placed but showed no recurrence so he has not been on anticoagulation. He was admitted in 04/2017 for cardiac arrest due to ventricular fibrillation.  LHC at that time showed mild nonobstructive CAD with patent RCA stent.  Echo showed LVEF of 40 To 45%.  He underwent placement of a Medtronic ICD in admission.  He was admitted in 08/2023 for acute on chronic CHF.  Echo showed EF had dropped to 30-35%.  Repeat LHC showed mild nonobstructive disease with continued patency of prior RCA stent.  Remote device interrogation showed high PVC burden.  Therefore, ZIO monitor was ordered and showed 5 short runs of nonsustained SVT  (longest run 10), 2 runs of NSVT (longest run 14.3 seconds), and frequent PVCs (burden 14.3%) .He was started on mexiletine with the thought that his drop in EF may be due to PVC burden.  Patient was last seen by Dr. Maxcy in 02/2024 time he was stable.  Repeat Echo was ordered to reassess LV function.  This was recently completed on 05/29/2024 and showed LVEF of 30-35% with global hypokinesis, mildly reduced RV function with severely elevated PASP of 68 mmHg, mild biatrial enlargement, s/p mitral valve replacement with mild MR, and mild TR.   Patient presented to the ED on 05/31/2024 for further evaluation of abnormal labs.  He saw his PCP earlier in the week for frequent emesis, poor PO intake, and early satiety.  Labs were drawn and showed an AKI creatinine of 3.22.  Therefore, he was advised to go to the ED.  Upon arrival to the ED, EKG showed rate controlled atrial fibrillation with PVC.  High-sensitivity troponin negative x 2.  BNP elevated at 1207.  Chest x-ray showed worsening interstitial opacities throughout the lungs likely representing interstitial edema. WBC 7.1, Hgb 11.9, Plts 262. Na 138, K 3.6, Glucose 94, BUN 43, Cr 3.10, Albumin  3.0. LFTS normals. Lipase normal. CK normal. Urinalysis showed >/= 500 but was otherwise negative. Respiratory panel negative. Abdominal/pelvic CT showed an 9 mm stone in the mid right ureter with prominent hydronephrosis, hydroureter, and perinephric  stranding as well as small bilateral pleural effusions with diffuse interstitial, reticular and nodular, and  alveolar infiltrates in the lung bases.  He was also admitted for AKI on CKD stage IIIb. He was started on gentle IV fluids.  There was initially concerns for community-acquired pneumonia and he was empirically started on antibiotics; however, procalcitonin then came back negative and it was felt to be more due to CHF than pneumonia.  Antibiotics were stopped and he was started on some IV Lasix . and urology was  consulted and he underwent cystoscopy with placement of right ureteral stent on 11/29.  Radiology now consulted for CHF.  Patient reports he has been having nausea/vomiting for a few weeks now.  This started after he was started on mexiletine.  The dose of this was reduced but symptoms persisted.  Therefore, mexiletine was stopped and he was switched to amiodarone .  The frequency of his nausea/vomiting did improve some with this but he still having it multiple times a week with a lot of dry heaving as well as decreased PO intake.  He reports some very mild dyspnea on exertion but states this is not new.  He denies any chest pain, orthopnea, PND, edema, palpitations.  He does describe an episode of lightheadedness and near syncope that occurred when he got up quickly to use the restroom in the middle of the night but symptoms are resolved when he laid down on the couch and elevated his legs.  He denies any loss of consciousness.  He coughed up a very small amount of black sputum earlier today but that was an isolated event.  No abnormal bleeding in urine or stools.  Of note, there is a phone note in his chart from 05/30/2024 from the device clinic who states that patient's transmission showed atrial fibrillation ongoing since 05/14/2024.  Appointment with the A-fib clinic was scheduled for 05/10/2024.   Past Medical History:  Diagnosis Date   Arthritis    Atrial fibrillation (HCC)    post op, intol of anticoag   Cardiac arrest [facility])    a. s/p MDT single chamber ICD; 11-10-18- pt denies having a heart attack   Cataract    CHF (congestive heart failure) (HCC)    Chronic kidney disease    kidney stone   Coronary artery disease    a. 2/7 Cath: LM nl, LAD min irregs, LCX min irregs, RI 40, RCA 47m, EF 55-60% basal to mid inf HK, 3-4+ MR;  b. 08/25/2012 PCI of RCA with 4.0x15 Vision BMS   Diabetes mellitus without complication (HCC)    GERD (gastroesophageal reflux disease)    hx   Heart murmur     Hyperlipidemia    on statin   Hypertension    Myocardial infarction (HCC)    PONV (postoperative nausea and vomiting)    S/P mitral valve repair 09/27/2012   Complex valvuloplasty including triangular resection of flail posterior leaflet with 30 mm Sorin Memo 3D ring annuloplasty via right mini thoracotomy approach   seasonal allergies 09/27/2008   Severe mitral regurgitation    a. Mitral valve prolapse with flail segment of posterior leaflet and severe MR by TEE, remote h/o bacterial endocarditis    Sleep apnea    NPSG 01/21/06- AHI 40.7/hr cpap   Stroke [facility])    summer 2022- one messed up vision, the other balance   Subacute bacterial endocarditis 03/22/2008   Strep viridans   Ventricular fibrillation (HCC) 11/07/2019   appropriate shock (36J) for VF delivered    Past Surgical History:  Procedure Laterality Date   AMPUTATION  07/28/2012  Procedure: AMPUTATION DIGIT;  Surgeon: Deward DELENA Schwartz, MD;  Location: [facility] ORS;  Service: Orthopedics;  Laterality: Right;  2nd toe   CARDIAC CATHETERIZATION     COLONOSCOPY     CYSTOSCOPY W/ URETERAL STENT PLACEMENT Right 06/03/2024   Procedure: CYSTOSCOPY, WITH RETROGRADE PYELOGRAM AND URETERAL STENT INSERTION;  Surgeon: Carolee Sherwood JONETTA DOUGLAS, MD;  Location: [facility] OR;  Service: Urology;  Laterality: Right;   EP IMPLANTABLE DEVICE N/A 05/01/2016   Procedure: Loop Recorder Removal;  Surgeon: Lynwood Rakers, MD;  Location: [facility] INVASIVE CV LAB;  Service: Cardiovascular;  Laterality: N/A;   FOOT SURGERY     BILATERAL TOES   ICD IMPLANT N/A 05/07/2017    Medtronic Visia AF MRI VR SureScan implanted by Dr Inocencio following VF arrest   Implantable loop recorder placement  03/31/2013   MDT Linq implanted by Dr Rakers to evaluate for further afib   INTRAOPERATIVE TRANSESOPHAGEAL ECHOCARDIOGRAM N/A 09/27/2012   Procedure: INTRAOPERATIVE TRANSESOPHAGEAL ECHOCARDIOGRAM;  Surgeon: Sudie VEAR Laine, MD;  Location[facility] OR;  Service: Open Heart Surgery;  Laterality: N/A;    LEFT AND RIGHT HEART CATHETERIZATION WITH CORONARY ANGIOGRAM N/A 08/12/2012   Procedure: LEFT AND RIGHT HEART CATHETERIZATION WITH CORONARY ANGIOGRAM;  Surgeon: Ezra GORMAN Shuck, MD;  Location: [facility] CATH LAB;  Service: Cardiovascular;  Laterality: N/A;   LEFT HEART CATH AND CORONARY ANGIOGRAPHY N/A 05/06/2017   Procedure: LEFT HEART CATH AND CORONARY ANGIOGRAPHY;  Surgeon: Anner Alm ORN, MD;  Location: [facility] INVASIVE CV LAB;  Service: Cardiovascular;  Laterality: N/A;   LEFT HEART CATH AND CORONARY ANGIOGRAPHY N/A 08/12/2023   Procedure: LEFT HEART CATH AND CORONARY ANGIOGRAPHY;  Surgeon: Jordan, Peter M, MD;  Location: [facility] INVASIVE CV LAB;  Service: Cardiovascular;  Laterality: N/A;   LOOP RECORDER IMPLANT N/A 03/31/2013   Procedure: LOOP RECORDER IMPLANT;  Surgeon: Lynwood JONETTA Rakers, MD;  Location: [facility] CATH LAB;  Service: Cardiovascular;  Laterality: N/A;   MITRAL VALVE REPAIR Right 09/27/2012   Procedure: MINIMALLY INVASIVE MITRAL VALVE REPAIR (MVR);  Surgeon: Sudie VEAR Laine, MD;  Location: [facility] OR;  Service: Open Heart Surgery;  Laterality: Right;  Ultrasound guided   PERCUTANEOUS CORONARY STENT INTERVENTION (PCI-S) N/A 08/25/2012   Procedure: PERCUTANEOUS CORONARY STENT INTERVENTION (PCI-S);  Surgeon: Ozell Fell, MD;  Location: [facility] CATH LAB;  Service: Cardiovascular;  Laterality: N/A;   POLYPECTOMY     SEPTOPLASTY     Dr. Floy   TEE WITHOUT CARDIOVERSION N/A 08/12/2012   Procedure: TRANSESOPHAGEAL ECHOCARDIOGRAM (TEE);  Surgeon: Ezra GORMAN Shuck, MD;  Location: [facility] ENDOSCOPY;  Service: Cardiovascular;  Laterality: N/A;   TONSILLECTOMY     UVULOPALATOPHARYNGOPLASTY       Home Medications:  Prior to Admission medications   Medication Sig Start Date End Date Taking? Authorizing Provider  acetaminophen  (TYLENOL ) 500 MG tablet Take 1,000 mg by mouth every 6 (six) hours as needed for mild pain (pain score 1-3) or headache.   Yes [provider]  atorvastatin  (LIPITOR) 40 MG tablet Take  1 tablet (40 mg total) by mouth daily. 11/30/23  Yes Rollene Almarie DELENA, MD  cetirizine (ZYRTEC) 10 MG tablet Take 10 mg by mouth daily.   Yes [provider]  cholecalciferol  (VITAMIN D ) 25 MCG (1000 UNIT) tablet Take 1,000 Units by mouth daily.   Yes [provider]  Choline Fenofibrate  (FENOFIBRIC ACID ) 45 MG CPDR Take 1 tablet by mouth daily. 03/03/24  Yes Hilty, Vinie BROCKS, MD  clopidogrel  (PLAVIX ) 75 MG tablet Take 1 tablet (75 mg total)  by mouth daily. 11/30/23  Yes [name], MD  dapagliflozin  propanediol (FARXIGA ) 10 MG TABS tablet Take 1 tablet (10 mg total) by mouth daily. 11/30/23  Yes [name], MD  doxycycline  (VIBRA -TABS) 100 MG tablet Take 1 tablet (100 mg total) by mouth 2 (two) times daily. 05/30/24  Yes [name], MD  fluticasone  (FLONASE ) 50 MCG/ACT nasal spray Place 2 sprays into both nostrils daily. 11/30/23  Yes [name], MD  furosemide  (LASIX ) 80 MG tablet Take 1 tablet (80 mg total) by mouth daily. 03/31/24 06/29/24 Yes Hilty, Vinie BROCKS, MD  Insulin  Lispro Prot & Lispro (HUMALOG  MIX 75/25 KWIKPEN) (75-25) 100 UNIT/ML Kwikpen Inject 18 Units into the skin in the morning and at bedtime. 11/30/23  Yes [name], MD  metFORMIN  (GLUCOPHAGE ) 1000 MG tablet Take 1 tablet (1,000 mg total) by mouth 2 (two) times daily with a meal. 11/30/23  Yes [name], MD  metoprolol  succinate (TOPROL -XL) 100 MG 24 hr tablet Take 1 tablet (100 mg total) by mouth daily. 11/30/23  Yes [name], MD  OVER THE COUNTER MEDICATION Take 1 tablet by mouth in the morning and at bedtime. [facility] - Restore multivitamin   Yes [provider]  spironolactone  (ALDACTONE ) 25 MG tablet TAKE 1 TABLET BY MOUTH DAILY 03/24/24  Yes Hilty, Vinie BROCKS, MD  tamsulosin  (FLOMAX ) 0.4 MG CAPS capsule Take 1 capsule (0.4 mg total) by mouth daily. Patient taking differently: Take 0.4 mg by mouth daily after supper. 11/30/23   Yes [name], MD  Accu-Chek Softclix Lancets lancets 1 each by Other route See admin instructions. Use TID 11/30/23   [name], MD  amiodarone  (PACERONE ) 200 MG tablet Take 1 tablet (200 mg total) by mouth 2 (two) times daily for 30 days, THEN 1 tablet (200 mg total) daily. Patient not taking: Reported on 06/02/2024 05/30/24 06/24/25  Inocencio Soyla Lunger, MD  glucose blood (ACCU-CHEK GUIDE TEST) test strip Use as instructed 11/30/23   [name], MD  mexiletine (MEXITIL) 150 MG capsule Take 1 capsule (150 mg total) by mouth 2 (two) times daily. Patient not taking: Reported on 06/02/2024 03/31/24   Inocencio Soyla Lunger, MD  sacubitril -valsartan  (ENTRESTO ) 97-103 MG TAKE 1 TABLET BY MOUTH TWICE  DAILY Patient not taking: Reported on 06/02/2024 05/23/24   Mona Vinie BROCKS, MD    Scheduled Meds:  amiodarone   200 mg Oral BID   Followed by   NOREEN ON 06/29/2024] amiodarone   200 mg Oral Daily   atorvastatin   40 mg Oral Daily   clopidogrel   75 mg Oral Daily   enoxaparin  (LOVENOX ) injection  30 mg Subcutaneous Daily   fenofibrate   54 mg Oral Daily   furosemide   40 mg Intravenous BID   insulin  aspart  0-15 Units Subcutaneous TID WC   insulin  aspart  0-5 Units Subcutaneous QHS   metoprolol  succinate  100 mg Oral Daily   sodium chloride  flush  3 mL Intravenous Q12H   tamsulosin   0.4 mg Oral Daily   Continuous Infusions:  PRN Meds: acetaminophen , ondansetron  **OR** ondansetron  (ZOFRAN ) IV, mouth rinse  Allergies:    Allergies  Allergen Reactions   Codeine Other (See Comments)    Causes bad constipation   Dust Mite Extract Cough   Pollen Extract Cough    Social History:   Social History   Socioeconomic History   Marital status: Divorced    Spouse name: Not on file   Number of children:  0   Years of education: Not on file   Highest education level: 12th grade  Occupational History   Occupation: Works at [employer] Retired  Tobacco Use   Smoking  status: Former    Current packs/day: 0.00    Average packs/day: 2.5 packs/day for 5.0 years (12.5 ttl pk-yrs)    Types: Cigarettes    Start date: 07/06/1973    Quit date: 07/06/1978    Years since quitting: 45.9   Smokeless tobacco: Never   Tobacco comments:    social drinker  Vaping Use   Vaping status: Never Used  Substance and Sexual Activity   Alcohol use: Yes    Comment: OCC.   Drug use: No   Sexual activity: Yes  Other Topics Concern   Not on file  Social History Narrative   Works at [employer] - plans to retire 07/2015 after 36 years.   Divorced, lives alone/2025   Supportive g-friend (shirlee moore)         Social Drivers of Health   Financial Resource Strain: Low Risk  (05/26/2024)   Overall Financial Resource Strain (CARDIA)    Difficulty of Paying Living Expenses: Not hard at all  Food Insecurity: No Food Insecurity (05/26/2024)   Hunger Vital Sign    Worried About Running Out of Food in the Last Year: Never true    Ran Out of Food in the Last Year: Never true  Transportation Needs: No Transportation Needs (05/26/2024)   PRAPARE - Administrator, Civil Service (Medical): No    Lack of Transportation (Non-Medical): No  Physical Activity: Inactive (05/26/2024)   Exercise Vital Sign    Days of Exercise per Week: 0 days    Minutes of Exercise per Session: Not on file  Stress: No Stress Concern Present (05/26/2024)   [employer] of Occupational Health - Occupational Stress Questionnaire    Feeling of Stress: Not at all  Social Connections: Moderately Isolated (05/26/2024)   Social Connection and Isolation Panel    Frequency of Communication with Friends and Family: More than three times a week    Frequency of Social Gatherings with Friends and Family: Once a week    Attends Religious Services: More than 4 times per year    Active Member of [employer] or Organizations: No    Attends Engineer, Structural: Not on file    Marital Status: Divorced   Intimate Partner Violence: Patient Unable To Answer (11/23/2023)   Humiliation, Afraid, Rape, and Kick questionnaire    Fear of Current or Ex-Partner: Patient unable to answer    Emotionally Abused: Patient unable to answer    Physically Abused: Patient unable to answer    Sexually Abused: Patient unable to answer    Family History:    Family History  Problem Relation Age of Onset   Diabetes Mother    Stroke Mother    Heart disease Father    Colon cancer Father 75   Diabetes Father    Renal cancer Father    Cancer Father    Sudden death Brother    Heart attack Brother    Esophageal cancer Neg Hx    Rectal cancer Neg Hx    Stomach cancer Neg Hx    Colon polyps Neg Hx      ROS:  Please see the history of present illness.    Physical Exam/Data: Vitals:   06/03/24 2038 06/04/24 0525 06/04/24 0754 06/04/24 1617  BP: 115/60 117/78 (!) 135/111 128/83  Pulse:  90 95 (!) 108 78  Resp: 18 18 18 18   Temp: 98.3 F (36.8 C) 98.4 F (36.9 C) 98.3 F (36.8 C) 98 F (36.7 C)  TempSrc: Oral Oral    SpO2: 97% 99% 95% 97%  Weight:      Height:        Intake/Output Summary (Last 24 hours) at 06/04/2024 1704 Last data filed at 06/04/2024 1235 Gross per 24 hour  Intake 480 ml  Output 25 ml  Net 455 ml      06/03/2024    7:35 AM 06/01/2024   12:27 AM 05/30/2024    3:04 PM  Last 3 Weights  Weight (lbs) 239 lb 220 lb 0.3 oz 219 lb 3.2 oz  Weight (kg) 108.41 kg 99.8 kg 99.428 kg     Body mass index is 34.29 kg/m.  General: 73 y.o. Caucasian male resting comfortably in no acute distress. HEENT: Normocephalic and atraumatic. Sclera clear.  Neck: Supple.  No JVD. Heart: Irregular rhythm with normal rate. Very soft systolic murmur. Radial pulses 2+ and equal bilaterally. Lungs: No increased work of breathing. Faint crackles noted in bilateral bases.  Abdomen: Soft, non-distended, and non-tender to palpation.  Extremities: No lower extremity edema.    Skin: Warm and  dry. Neuro: Alert and oriented x3. No focal deficits. Psych: Normal affect. Responds appropriately.   EKG:  The EKG was personally reviewed and demonstrates:  Atrial fibrillation, rate 83 bpm, with PVCs and RBBB but no acute ischemic changes. Telemetry:  Telemetry was personally reviewed and demonstrates:  Atrial fibrillation with PVCs. Rates in the 80s.  Relevant CV Studies:  Echocardiogram 05/29/2024: Impressions:  1. Left ventricular ejection fraction, by estimation, is 30 to 35%. The  left ventricle has moderately decreased function. The left ventricle  demonstrates global hypokinesis. The left ventricular internal cavity size  was moderately dilated. Left  ventricular diastolic function could not be evaluated. Elevated left  atrial pressure. The E/e' is 27.   2. Right ventricular systolic function is mildly reduced. The right  ventricular size is normal. Mildly increased right ventricular wall  thickness. There is severely elevated pulmonary artery systolic pressure.  The estimated right ventricular systolic  pressure is 68.0 mmHg.   3. Left atrial size was mildly dilated.   4. Right atrial size was mildly dilated.   5. S/P 30mm Sorin Memo 3D Annuloplasty Ring, implanted 09/27/2012, no  mitral stenosis (MG at HR 89bpm), mild mitral regurgitation.   6. The aortic valve is tricuspid. Aortic valve regurgitation is mild.  Aortic valve sclerosis is present, with no evidence of aortic valve  stenosis.   7. The inferior vena cava is normal in size with greater than 50%  respiratory variability, suggesting right atrial pressure of 3 mmHg.    Laboratory Data: High Sensitivity Troponin:   Recent Labs  Lab 05/31/24 2019 05/31/24 2203  TROPONINIHS 17 17     Chemistry Recent Labs  Lab 05/31/24 2019 05/31/24 2029 06/01/24 0408 06/02/24 0259 06/03/24 0452 06/04/24 0357  NA 138   < > 139 139 141 136  K 3.6   < > 3.6 3.6 3.7 3.7  CL 101   < > 102 105 106 106  CO2 23   --  26 24 26  21*  GLUCOSE 94   < > 99 130* 101* 126*  BUN 43*   < > 40* 35* 32* 31*  CREATININE 3.10*   < > 2.95* 2.83* 2.82* 2.73*  CALCIUM  9.0  --  8.7* 8.3* 8.4* 8.3*  MG 1.8  --  1.7  --   --   --   GFRNONAA 20*  --  22* 23* 23* 24*  ANIONGAP 14  --  11 10 9 9    < > = values in this interval not displayed.    Recent Labs  Lab 06/02/24 0259 06/03/24 0452 06/04/24 0357  PROT 5.4* 5.8* 5.6*  ALBUMIN  2.7* 2.9* 2.9*  AST 18 18 18   ALT 11 13 12   ALKPHOS 30* 28* 26*  BILITOT 0.8 0.7 0.7   Lipids No results for input(s): CHOL, TRIG, HDL, LABVLDL, LDLCALC, CHOLHDL in the last 168 hours.  Hematology Recent Labs  Lab 06/02/24 0259 06/03/24 0452 06/04/24 0357  WBC 6.7 7.1 7.6  RBC 3.54* 3.63* 3.34*  HGB 10.6* 11.0* 10.0*  HCT 32.2* 32.8* 30.2*  MCV 91.0 90.4 90.4  MCH 29.9 30.3 29.9  MCHC 32.9 33.5 33.1  RDW 14.2 14.2 14.3  PLT 200 225 206   Thyroid  No results for input(s): TSH, FREET4 in the last 168 hours.  BNP Recent Labs  Lab 05/31/24 2019  BNP 1,207.5*    DDimer No results for input(s): DDIMER in the last 168 hours.  Radiology/Studies:  DG C-Arm 1-60 Min-No Report Result Date: 06/03/2024 Fluoroscopy was utilized by the requesting physician.  No radiographic interpretation.   DG Chest 2 View Result Date: 06/02/2024 CLINICAL DATA:  [facility]ales EXAM: CHEST - 2 VIEW COMPARISON:  Chest radiograph dated 05/31/2024 FINDINGS: Lines/tubes: Left chest wall ICD lead projects over the right ventricle. Lungs: Low lung volumes with bronchovascular crowding. Increased diffuse interstitial opacity. Pleura: Blunting of bilateral costophrenic angles.  No pneumothorax. Heart/mediastinum: Similar enlarged cardiomediastinal silhouette. Bones: No acute osseous abnormality. IMPRESSION: 1. Increased diffuse interstitial opacity, which may represent pulmonary edema. 2. Blunting of bilateral costophrenic angles, which may represent small pleural effusions. Electronically Signed    By: Limin  Xu M.D.   On: 06/02/2024 16:15   CT ABDOMEN PELVIS WO CONTRAST Result Date: 05/31/2024 EXAM: CT ABDOMEN AND PELVIS WITHOUT CONTRAST 05/31/2024 10:34:00 PM TECHNIQUE: CT of the abdomen and pelvis was performed without the administration of intravenous contrast. Multiplanar reformatted images are provided for review. Automated exposure control, iterative reconstruction, and/or weight-based adjustment of the mA/kV was utilized to reduce the radiation dose to as low as reasonably achievable. COMPARISON: Abdominal radiograph 05/05/2017 and CT abdomen and pelvis 01/04/2006. CLINICAL HISTORY: Abdominal pain, acute, nonlocalized; P.o. intolerance. FINDINGS: LOWER CHEST: Small bilateral pleural effusions, greater on the right. Diffuse interstitial reticular nodular and alveolar infiltrates in the lung bases. This probably represents edema, possibly with underlying chronic interstitial lung disease. Pneumonia or aspiration could also have this pattern. Cardiac enlargement. LIVER: The liver is unremarkable. GALLBLADDER AND BILE DUCTS: Gallbladder is unremarkable. No biliary ductal dilatation. SPLEEN: No acute abnormality. PANCREAS: No acute abnormality. ADRENAL GLANDS: No acute abnormality. KIDNEYS, URETERS AND BLADDER: Right kidney: 9 mm stone in the mid right ureter at the level of L4 with prominent hydronephrosis and hydroureter and stranding around the right kidney. Distal right ureter is decompressed. Left kidney: 3.3 cm cyst on the left kidney. No imaging follow-up is indicated. Per consensus, no follow-up is needed for simple Bosniak type 1 and 2 renal cysts, unless the patient has a malignancy history or risk factors. Left kidney and left ureter are otherwise unremarkable. Urinary bladder is unremarkable. GI AND BOWEL: The stomach, small bowel, and colon are not abnormally distended. No wall thickening or inflammatory stranding is appreciated. The appendix is  normal. There is no bowel obstruction.  PERITONEUM AND RETROPERITONEUM: No ascites. No free air. VASCULATURE: Calcification of the aorta. Aorta is normal in caliber. No aneurysm. LYMPH NODES: No lymphadenopathy. REPRODUCTIVE ORGANS: The prostate gland is mildly enlarged. BONES AND SOFT TISSUES: Degenerative changes in the spine. No acute osseous abnormality. No focal soft tissue abnormality. IMPRESSION: 1. 9 mm stone in the mid right ureter at the level of L4 with prominent hydronephrosis, hydroureter, and perinephric stranding. Distal ureter is decompressed. 2. Small bilateral pleural effusions, greater on the right, with diffuse interstitial, reticulonodular, and alveolar infiltrates in the lung bases, which may reflect edema, chronic interstitial lung disease, pneumonia, or aspiration. 3. Cardiac enlargement. Electronically signed by: Elsie Gravely MD 05/31/2024 10:43 PM EST RP Workstation: [id number]   DG Chest 2 View Result Date: 05/31/2024 EXAM: 2 VIEW(S) XRAY OF THE CHEST 05/31/2024 08:17:00 PM COMPARISON: 05/30/2024 CLINICAL HISTORY: bradycardia bradycardia FINDINGS: LINES, TUBES AND DEVICES: Left AICD remains in place, unchanged. LUNGS AND PLEURA: Worsening interstitial opacities throughout the lungs, likely interstitial edema. No pleural effusion. No pneumothorax. HEART AND MEDIASTINUM: Cardiomegaly. Left AICD remains in place, unchanged. BONES AND SOFT TISSUES: No acute osseous abnormality. IMPRESSION: 1. Worsening interstitial opacities throughout the lungs, likely interstitial edema. 2. Cardiomegaly. Electronically signed by: Franky Crease MD 05/31/2024 08:21 PM EST RP Workstation: [id number]     Assessment and Plan:  Acute on Chronic HFrEF Patient has a history of chronic HFrEF.  Recent outpatient echo on 05/29/2024 showed  LVEF of 30-35% with global hypokinesis, mildly reduced RV function with severely elevated PASP of 68 mmHg, mild biatrial enlargement, s/p mitral valve replacement with mild MR, and mild TR. He now presents with  persistent nausea/vomiting, poor PO intake, and AKI.  He was initially treated with IV fluids for AKI and initial concern for pneumonia.  However, BNP elevated in the 1200s and repeat imaging was more consistent with CHF.  Therefore, he was started on IV Lasix .  He is still net positive 4.8 L at this time. - He has mild crackles noted in bilateral bases but otherwise does not appear markedly volume overloaded. Although, weight is up 10 lbs from last visit in 02/2024. - Renal function is slowly improving.  Continue IV Lasix  40 mg twice daily.  Hopefully, now that he has undergone right ureteral stenting for treatment of right ureteral stone with hydronephrosis, renal function will continue to improve. - Will continue to hold home Entresto , Spironolactone , and Farxiga  given AKI. - Continue Toprol -XL 100mg  daily. - Continue to monitor daily weights, strict I/Os, and renal function. - Looks like he has likely been in atrial fibrillation since 05/14/2024 so this may have been the cause of current exacerbation. - Will plan for RHC likely tomorrow to make sure he is not in low output and to more accurately assess his volume status given reports of persistent nausea/ vomiting and early satiety.   CAD S/p BMS to RCA in 2014.  Last cath in 08/2023 showed patent stent with otherwise only mild nonobstructive disease. - No chest pain. - Currently on Plavix  given history of CVA.  However, we will stop this given plans to start DOAC. - Continue Lipitor 40 mg daily.  Persistent Atrial Fibrillation Patient has a history of postop atrial fibrillation after mitral valve repair in 2014.  He was initially started on anticoagulation at that time but was unable to tolerate Eliquis  due to headache and Xarelto  due to a spontaneous thigh hematoma.  Therefore anticoagulation was stopped and a loop recorder  was placed to monitor for recurrence.  It does not look like he has had any recurrence until recently.  Recent remote  transmission of his ICD showed that it looks like he has been in atrial fibrillation since 05/14/2024. - Rate controlled.  - Continue Toprol -XL 100mg  daily. - Continue Amiodarone  200mg  twice daily. He was recently started on this as an outpatient but had not been consistently taking it. - CHA2S2-VASc = 8 (CAD, CHF, HTN, DM, CVA x2, age x2). He is willing to retry anticoagulation. Will place him on IV Heparin  for now in anticipation for RHC and then will plan to start Eliquis  prior to discharge.  Frequent PVCs Zio monitor in 10/2023 showed PVC 14.3% burden. He was started on Mexiletine but was unable to tolerate this due to nausea/ vomiting. He was recently switched to Amiodarone  but has not been taking it consistently.  - Continue Amiodarone  200mg  twice daily. - Continue Toprol -XL 100mg  daily. - Continue to monitor on telemetry  S/p mitral valve repair History of mitral valve repair in 2014 in setting of subacute bacterial endocarditis.  Most recent Echo earlier this admission showed mild MR. - Can continue to monitor as an outpatient.  Hypertension BP soft at times but stable. - Continue Toprol -XL 100mg  daily. - Entresto  and Spironolactone  on hold due to AKI.  Hyperlipidemia - Continue Lipitor 40mg  daily.  AKI on CKD Stage IIIb Creatinine 3.22 on admission. Recent baseline around 2.0. Work-up did reveal a right ureteral obstruction and she underewnt stenting on 11/29.  - Creatinine slowly improving - 2.73. - Continue to monitor closely with diuresis.  Otherwise, per primary team: - Type 2 diabetes mellitus  - Persistent nausea/vomiting - Right hydronephrosis due to ureteral stone - Obstructive sleep apnea    Risk Assessment/Risk Scores:   [place]  Heart Association (NYHA) Functional Class NYHA Class II  CHA2DS2-VASc Score = 7  This indicates a 11.2% annual risk of stroke. The patient's score is based upon: CHF History: 1 HTN History: 1 Diabetes History: 1 Stroke  History: 2 Vascular Disease History: 1 Age Score: 1 Gender Score: 0   For questions or updates, please contact [facility] Please consult [email or web address] for contact info under      Signed, Norvell Ureste E Jedrek Dinovo, PA-C  06/04/2024 5:04 PM

## 2024-06-04 NOTE — Plan of Care (Signed)
  Problem: Fluid Volume: Goal: Ability to maintain a balanced intake and output will improve Outcome: Progressing   Problem: Metabolic: Goal: Ability to maintain appropriate glucose levels will improve Outcome: Progressing   Problem: Nutritional: Goal: Maintenance of adequate nutrition will improve Outcome: Progressing

## 2024-06-04 NOTE — Consult Note (Signed)
 Cardiology Consultation   Patient ID: Christopher [name] MRN: [id number]; DOB: August 05, 1950  Admit date: 05/31/2024 Date of Consult: 06/04/2024  PCP:  [name], MD   Christopher [name] Cardiologist:  Christopher JAYSON Maxcy, MD  Electrophysiologist:  Christopher Lunger Norton, MD       Patient Profile: Christopher [name] is a 74 y.o. 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 Jonel.  History of Present Illness: [name] is a 73 year old male with the above history who is followed by Dr. Skeeter and Dr. Norton.  He has a history of CAD with remote PCI with BMS to RCA in 2014.  He then underwent a mitral valve repair in 09/2022 due to subacute endocarditis.  He had postop atrial fibrillation but was unable to tolerate Eliquis  due to headaches and Xarelto  due to a spontaneous right thigh hematoma.  A loop recorder was placed but showed no recurrence so he has not been on anticoagulation. He was admitted in 04/2017 for cardiac arrest due to ventricular fibrillation.  LHC at that time showed mild nonobstructive CAD with patent RCA stent.  Echo showed LVEF of 40 To 45%.  He underwent placement of a Medtronic ICD in admission.  He was admitted in 08/2023 for acute on chronic CHF.  Echo showed EF had dropped to 30-35%.  Repeat LHC showed mild nonobstructive disease with continued patency of prior RCA stent.  Remote device interrogation showed high PVC burden.  Therefore, ZIO monitor was ordered and showed 5 short runs of nonsustained SVT  (longest run 10), 2 runs of NSVT (longest run 14.3 seconds), and frequent PVCs (burden 14.3%) .He was started on mexiletine with the thought that his drop in EF may be due to PVC burden.  Patient was last seen by Dr. Maxcy in 02/2024 time he was stable.  Repeat Echo was ordered to reassess LV function.  This was recently completed on 05/29/2024 and showed LVEF of 30-35% with global hypokinesis, mildly reduced RV function with severely elevated PASP of 68 mmHg, mild biatrial enlargement, s/p mitral valve replacement with mild MR, and mild TR.   Patient presented to the ED on 05/31/2024 for further evaluation of abnormal labs.  He saw his PCP earlier in the week for frequent emesis, poor PO intake, and early satiety.  Labs were drawn and showed an AKI creatinine of 3.22.  Therefore, he was advised to go to the ED.  Upon arrival to the ED, EKG showed rate controlled atrial fibrillation with PVC.  High-sensitivity troponin negative x 2.  BNP elevated at 1207.  Chest x-ray showed worsening interstitial opacities throughout the lungs likely representing interstitial edema. WBC 7.1, Hgb 11.9, Plts 262. Na 138, K 3.6, Glucose 94, BUN 43, Cr 3.10, Albumin  3.0. LFTS normals. Lipase normal. CK normal. Urinalysis showed >/= 500 but was otherwise negative. Respiratory panel negative. Abdominal/pelvic CT showed an 9 mm stone in the mid right ureter with prominent hydronephrosis, hydroureter, and perinephric  stranding as well as small bilateral pleural effusions with diffuse interstitial, reticular and nodular, and  alveolar infiltrates in the lung bases.  He was also admitted for AKI on CKD stage IIIb. He was started on gentle IV fluids.  There was initially concerns for community-acquired pneumonia and he was empirically started on antibiotics; however, procalcitonin then came back negative and it was felt to be more due to CHF than pneumonia.  Antibiotics were stopped and he was started on some IV Lasix . and urology was  consulted and he underwent cystoscopy with placement of right ureteral stent on 11/29.  Radiology now consulted for CHF.  Patient reports he has been having nausea/vomiting for a few weeks now.  This started after he was started on mexiletine.  The dose of this was reduced but symptoms persisted.  Therefore, mexiletine was stopped and he was switched to amiodarone .  The frequency of his nausea/vomiting did improve some with this but he still having it multiple times a week with a lot of dry heaving as well as decreased PO intake.  He reports some very mild dyspnea on exertion but states this is not new.  He denies any chest pain, orthopnea, PND, edema, palpitations.  He does describe an episode of lightheadedness and near syncope that occurred when he got up quickly to use the restroom in the middle of the night but symptoms are resolved when he laid down on the couch and elevated his legs.  He denies any loss of consciousness.  He coughed up a very small amount of black sputum earlier today but that was an isolated event.  No abnormal bleeding in urine or stools.  Of note, there is a phone note in his chart from 05/30/2024 from the device clinic who states that patient's transmission showed atrial fibrillation ongoing since 05/14/2024.  Appointment with the A-fib clinic was scheduled for 05/10/2024.   Past Medical History:  Diagnosis Date   Arthritis    Atrial fibrillation (HCC)    post op, intol of anticoag   Cardiac arrest [facility])    a. s/p MDT single chamber ICD; 11-10-18- pt denies having a heart attack   Cataract    CHF (congestive heart failure) (HCC)    Chronic kidney disease    kidney stone   Coronary artery disease    a. 2/7 Cath: LM nl, LAD min irregs, LCX min irregs, RI 40, RCA 47m, EF 55-60% basal to mid inf HK, 3-4+ MR;  b. 08/25/2012 PCI of RCA with 4.0x15 Vision BMS   Diabetes mellitus without complication (HCC)    GERD (gastroesophageal reflux disease)    hx   Heart murmur     Hyperlipidemia    on statin   Hypertension    Myocardial infarction (HCC)    PONV (postoperative nausea and vomiting)    S/P mitral valve repair 09/27/2012   Complex valvuloplasty including triangular resection of flail posterior leaflet with 30 mm Sorin Memo 3D ring annuloplasty via right mini thoracotomy approach   seasonal allergies 09/27/2008   Severe mitral regurgitation    a. Mitral valve prolapse with flail segment of posterior leaflet and severe MR by TEE, remote h/o bacterial endocarditis    Sleep apnea    NPSG 01/21/06- AHI 40.7/hr cpap   Stroke [facility])    summer 2022- one messed up vision, the other balance   Subacute bacterial endocarditis 03/22/2008   Strep viridans   Ventricular fibrillation (HCC) 11/07/2019   appropriate shock (36J) for VF delivered    Past Surgical History:  Procedure Laterality Date   AMPUTATION  07/28/2012  Procedure: AMPUTATION DIGIT;  Surgeon: Deward DELENA Schwartz, MD;  Location: [facility] ORS;  Service: Orthopedics;  Laterality: Right;  2nd toe   CARDIAC CATHETERIZATION     COLONOSCOPY     CYSTOSCOPY W/ URETERAL STENT PLACEMENT Right 06/03/2024   Procedure: CYSTOSCOPY, WITH RETROGRADE PYELOGRAM AND URETERAL STENT INSERTION;  Surgeon: Carolee Sherwood JONETTA DOUGLAS, MD;  Location: [facility] OR;  Service: Urology;  Laterality: Right;   EP IMPLANTABLE DEVICE N/A 05/01/2016   Procedure: Loop Recorder Removal;  Surgeon: Lynwood Rakers, MD;  Location: [facility] INVASIVE CV LAB;  Service: Cardiovascular;  Laterality: N/A;   FOOT SURGERY     BILATERAL TOES   ICD IMPLANT N/A 05/07/2017    Medtronic Visia AF MRI VR SureScan implanted by Dr Inocencio following VF arrest   Implantable loop recorder placement  03/31/2013   MDT Linq implanted by Dr Rakers to evaluate for further afib   INTRAOPERATIVE TRANSESOPHAGEAL ECHOCARDIOGRAM N/A 09/27/2012   Procedure: INTRAOPERATIVE TRANSESOPHAGEAL ECHOCARDIOGRAM;  Surgeon: Sudie VEAR Laine, MD;  Location: [facility] OR;  Service: Open Heart Surgery;  Laterality: N/A;    LEFT AND RIGHT HEART CATHETERIZATION WITH CORONARY ANGIOGRAM N/A 08/12/2012   Procedure: LEFT AND RIGHT HEART CATHETERIZATION WITH CORONARY ANGIOGRAM;  Surgeon: Ezra GORMAN Shuck, MD;  Location: [facility] CATH LAB;  Service: Cardiovascular;  Laterality: N/A;   LEFT HEART CATH AND CORONARY ANGIOGRAPHY N/A 05/06/2017   Procedure: LEFT HEART CATH AND CORONARY ANGIOGRAPHY;  Surgeon: Anner Alm ORN, MD;  Location: [facility] INVASIVE CV LAB;  Service: Cardiovascular;  Laterality: N/A;   LEFT HEART CATH AND CORONARY ANGIOGRAPHY N/A 08/12/2023   Procedure: LEFT HEART CATH AND CORONARY ANGIOGRAPHY;  Surgeon: Jordan, Peter M, MD;  Location: [facility] INVASIVE CV LAB;  Service: Cardiovascular;  Laterality: N/A;   LOOP RECORDER IMPLANT N/A 03/31/2013   Procedure: LOOP RECORDER IMPLANT;  Surgeon: Lynwood JONETTA Rakers, MD;  Location: [facility] CATH LAB;  Service: Cardiovascular;  Laterality: N/A;   MITRAL VALVE REPAIR Right 09/27/2012   Procedure: MINIMALLY INVASIVE MITRAL VALVE REPAIR (MVR);  Surgeon: Sudie VEAR Laine, MD;  Location: [facility] OR;  Service: Open Heart Surgery;  Laterality: Right;  Ultrasound guided   PERCUTANEOUS CORONARY STENT INTERVENTION (PCI-S) N/A 08/25/2012   Procedure: PERCUTANEOUS CORONARY STENT INTERVENTION (PCI-S);  Surgeon: Ozell Fell, MD;  Location: [facility] CATH LAB;  Service: Cardiovascular;  Laterality: N/A;   POLYPECTOMY     SEPTOPLASTY     Dr. Floy   TEE WITHOUT CARDIOVERSION N/A 08/12/2012   Procedure: TRANSESOPHAGEAL ECHOCARDIOGRAM (TEE);  Surgeon: Ezra GORMAN Shuck, MD;  Location: [facility] ENDOSCOPY;  Service: Cardiovascular;  Laterality: N/A;   TONSILLECTOMY     UVULOPALATOPHARYNGOPLASTY       Home Medications:  Prior to Admission medications   Medication Sig Start Date End Date Taking? Authorizing Provider  acetaminophen  (TYLENOL ) 500 MG tablet Take 1,000 mg by mouth every 6 (six) hours as needed for mild pain (pain score 1-3) or headache.   Yes [provider]  atorvastatin  (LIPITOR) 40 MG tablet Take  1 tablet (40 mg total) by mouth daily. 11/30/23  Yes Rollene Almarie DELENA, MD  cetirizine (ZYRTEC) 10 MG tablet Take 10 mg by mouth daily.   Yes [provider]  cholecalciferol  (VITAMIN D ) 25 MCG (1000 UNIT) tablet Take 1,000 Units by mouth daily.   Yes [provider]  Choline Fenofibrate  (FENOFIBRIC ACID ) 45 MG CPDR Take 1 tablet by mouth daily. 03/03/24  Yes Hilty, Christopher BROCKS, MD  clopidogrel  (PLAVIX ) 75 MG tablet Take 1 tablet (75 mg total)  by mouth daily. 11/30/23  Yes [name], MD  dapagliflozin  propanediol (FARXIGA ) 10 MG TABS tablet Take 1 tablet (10 mg total) by mouth daily. 11/30/23  Yes [name], MD  doxycycline  (VIBRA -TABS) 100 MG tablet Take 1 tablet (100 mg total) by mouth 2 (two) times daily. 05/30/24  Yes [name], MD  fluticasone  (FLONASE ) 50 MCG/ACT nasal spray Place 2 sprays into both nostrils daily. 11/30/23  Yes [name], MD  furosemide  (LASIX ) 80 MG tablet Take 1 tablet (80 mg total) by mouth daily. 03/31/24 06/29/24 Yes Hilty, Christopher BROCKS, MD  Insulin  Lispro Prot & Lispro (HUMALOG  MIX 75/25 KWIKPEN) (75-25) 100 UNIT/ML Kwikpen Inject 18 Units into the skin in the morning and at bedtime. 11/30/23  Yes [name], MD  metFORMIN  (GLUCOPHAGE ) 1000 MG tablet Take 1 tablet (1,000 mg total) by mouth 2 (two) times daily with a meal. 11/30/23  Yes [name], MD  metoprolol  succinate (TOPROL -XL) 100 MG 24 hr tablet Take 1 tablet (100 mg total) by mouth daily. 11/30/23  Yes [name], MD  OVER THE COUNTER MEDICATION Take 1 tablet by mouth in the morning and at bedtime. [facility] - Restore multivitamin   Yes [provider]  spironolactone  (ALDACTONE ) 25 MG tablet TAKE 1 TABLET BY MOUTH DAILY 03/24/24  Yes Hilty, Christopher BROCKS, MD  tamsulosin  (FLOMAX ) 0.4 MG CAPS capsule Take 1 capsule (0.4 mg total) by mouth daily. Patient taking differently: Take 0.4 mg by mouth daily after supper. 11/30/23   Yes [name], MD  Accu-Chek Softclix Lancets lancets 1 each by Other route See admin instructions. Use TID 11/30/23   [name], MD  amiodarone  (PACERONE ) 200 MG tablet Take 1 tablet (200 mg total) by mouth 2 (two) times daily for 30 days, THEN 1 tablet (200 mg total) daily. Patient not taking: Reported on 06/02/2024 05/30/24 06/24/25  Inocencio Christopher Lunger, MD  glucose blood (ACCU-CHEK GUIDE TEST) test strip Use as instructed 11/30/23   [name], MD  mexiletine (MEXITIL) 150 MG capsule Take 1 capsule (150 mg total) by mouth 2 (two) times daily. Patient not taking: Reported on 06/02/2024 03/31/24   Inocencio Christopher Lunger, MD  sacubitril -valsartan  (ENTRESTO ) 97-103 MG TAKE 1 TABLET BY MOUTH TWICE  DAILY Patient not taking: Reported on 06/02/2024 05/23/24   Mona Christopher BROCKS, MD    Scheduled Meds:  amiodarone   200 mg Oral BID   Followed by   NOREEN ON 06/29/2024] amiodarone   200 mg Oral Daily   atorvastatin   40 mg Oral Daily   clopidogrel   75 mg Oral Daily   enoxaparin  (LOVENOX ) injection  30 mg Subcutaneous Daily   fenofibrate   54 mg Oral Daily   furosemide   40 mg Intravenous BID   insulin  aspart  0-15 Units Subcutaneous TID WC   insulin  aspart  0-5 Units Subcutaneous QHS   metoprolol  succinate  100 mg Oral Daily   sodium chloride  flush  3 mL Intravenous Q12H   tamsulosin   0.4 mg Oral Daily   Continuous Infusions:  PRN Meds: acetaminophen , ondansetron  **OR** ondansetron  (ZOFRAN ) IV, mouth rinse  Allergies:    Allergies  Allergen Reactions   Codeine Other (See Comments)    Causes bad constipation   Dust Mite Extract Cough   Pollen Extract Cough    Social History:   Social History   Socioeconomic History   Marital status: Divorced    Spouse name: Not on file   Number of children:  0   Years of education: Not on file   Highest education level: 12th grade  Occupational History   Occupation: Works at [employer] Retired  Tobacco Use   Smoking  status: Former    Current packs/day: 0.00    Average packs/day: 2.5 packs/day for 5.0 years (12.5 ttl pk-yrs)    Types: Cigarettes    Start date: 07/06/1973    Quit date: 07/06/1978    Years since quitting: 45.9   Smokeless tobacco: Never   Tobacco comments:    social drinker  Vaping Use   Vaping status: Never Used  Substance and Sexual Activity   Alcohol use: Yes    Comment: OCC.   Drug use: No   Sexual activity: Yes  Other Topics Concern   Not on file  Social History Narrative   Works at [employer] - plans to retire 07/2015 after 36 years.   Divorced, lives alone/2025   Supportive g-friend (shirlee moore)         Social Drivers of Health   Financial Resource Strain: Low Risk  (05/26/2024)   Overall Financial Resource Strain (CARDIA)    Difficulty of Paying Living Expenses: Not hard at all  Food Insecurity: No Food Insecurity (05/26/2024)   Hunger Vital Sign    Worried About Running Out of Food in the Last Year: Never true    Ran Out of Food in the Last Year: Never true  Transportation Needs: No Transportation Needs (05/26/2024)   PRAPARE - Administrator, Civil Service (Medical): No    Lack of Transportation (Non-Medical): No  Physical Activity: Inactive (05/26/2024)   Exercise Vital Sign    Days of Exercise per Week: 0 days    Minutes of Exercise per Session: Not on file  Stress: No Stress Concern Present (05/26/2024)   [employer] of Occupational Health - Occupational Stress Questionnaire    Feeling of Stress: Not at all  Social Connections: Moderately Isolated (05/26/2024)   Social Connection and Isolation Panel    Frequency of Communication with Friends and Family: More than three times a week    Frequency of Social Gatherings with Friends and Family: Once a week    Attends Religious Services: More than 4 times per year    Active Member of [employer] or Organizations: No    Attends Engineer, Structural: Not on file    Marital Status: Divorced   Intimate Partner Violence: Patient Unable To Answer (11/23/2023)   Humiliation, Afraid, Rape, and Kick questionnaire    Fear of Current or Ex-Partner: Patient unable to answer    Emotionally Abused: Patient unable to answer    Physically Abused: Patient unable to answer    Sexually Abused: Patient unable to answer    Family History:    Family History  Problem Relation Age of Onset   Diabetes Mother    Stroke Mother    Heart disease Father    Colon cancer Father 74   Diabetes Father    Renal cancer Father    Cancer Father    Sudden death Brother    Heart attack Brother    Esophageal cancer Neg Hx    Rectal cancer Neg Hx    Stomach cancer Neg Hx    Colon polyps Neg Hx      ROS:  Please see the history of present illness.    Physical Exam/Data: Vitals:   06/03/24 2038 06/04/24 0525 06/04/24 0754 06/04/24 1617  BP: 115/60 117/78 (!) 135/111 128/83  Pulse:  90 95 (!) 108 78  Resp: 18 18 18 18   Temp: 98.3 F (36.8 C) 98.4 F (36.9 C) 98.3 F (36.8 C) 98 F (36.7 C)  TempSrc: Oral Oral    SpO2: 97% 99% 95% 97%  Weight:      Height:        Intake/Output Summary (Last 24 hours) at 06/04/2024 1704 Last data filed at 06/04/2024 1235 Gross per 24 hour  Intake 480 ml  Output 25 ml  Net 455 ml      06/03/2024    7:35 AM 06/01/2024   12:27 AM 05/30/2024    3:04 PM  Last 3 Weights  Weight (lbs) 239 lb 220 lb 0.3 oz 219 lb 3.2 oz  Weight (kg) 108.41 kg 99.8 kg 99.428 kg     Body mass index is 34.29 kg/m.  General: 73 y.o. Caucasian male resting comfortably in no acute distress. HEENT: Normocephalic and atraumatic. Sclera clear.  Neck: Supple.  No JVD. Heart: Irregular rhythm with normal rate. Very soft systolic murmur. Radial pulses 2+ and equal bilaterally. Lungs: No increased work of breathing. Faint crackles noted in bilateral bases.  Abdomen: Soft, non-distended, and non-tender to palpation.  Extremities: No lower extremity edema.    Skin: Warm and  dry. Neuro: Alert and oriented x3. No focal deficits. Psych: Normal affect. Responds appropriately.   EKG:  The EKG was personally reviewed and demonstrates:  Atrial fibrillation, rate 83 bpm, with PVCs and RBBB but no acute ischemic changes. Telemetry:  Telemetry was personally reviewed and demonstrates:  Atrial fibrillation with PVCs. Rates in the 80s.  Relevant CV Studies:  Echocardiogram 05/29/2024: Impressions:  1. Left ventricular ejection fraction, by estimation, is 30 to 35%. The  left ventricle has moderately decreased function. The left ventricle  demonstrates global hypokinesis. The left ventricular internal cavity size  was moderately dilated. Left  ventricular diastolic function could not be evaluated. Elevated left  atrial pressure. The E/e' is 27.   2. Right ventricular systolic function is mildly reduced. The right  ventricular size is normal. Mildly increased right ventricular wall  thickness. There is severely elevated pulmonary artery systolic pressure.  The estimated right ventricular systolic  pressure is 68.0 mmHg.   3. Left atrial size was mildly dilated.   4. Right atrial size was mildly dilated.   5. S/P 30mm Sorin Memo 3D Annuloplasty Ring, implanted 09/27/2012, no  mitral stenosis (MG at HR 89bpm), mild mitral regurgitation.   6. The aortic valve is tricuspid. Aortic valve regurgitation is mild.  Aortic valve sclerosis is present, with no evidence of aortic valve  stenosis.   7. The inferior vena cava is normal in size with greater than 50%  respiratory variability, suggesting right atrial pressure of 3 mmHg.    Laboratory Data: High Sensitivity Troponin:   Recent Labs  Lab 05/31/24 2019 05/31/24 2203  TROPONINIHS 17 17     Chemistry Recent Labs  Lab 05/31/24 2019 05/31/24 2029 06/01/24 0408 06/02/24 0259 06/03/24 0452 06/04/24 0357  NA 138   < > 139 139 141 136  K 3.6   < > 3.6 3.6 3.7 3.7  CL 101   < > 102 105 106 106  CO2 23   --  26 24 26  21*  GLUCOSE 94   < > 99 130* 101* 126*  BUN 43*   < > 40* 35* 32* 31*  CREATININE 3.10*   < > 2.95* 2.83* 2.82* 2.73*  CALCIUM  9.0  --  8.7* 8.3* 8.4* 8.3*  MG 1.8  --  1.7  --   --   --   GFRNONAA 20*  --  22* 23* 23* 24*  ANIONGAP 14  --  11 10 9 9    < > = values in this interval not displayed.    Recent Labs  Lab 06/02/24 0259 06/03/24 0452 06/04/24 0357  PROT 5.4* 5.8* 5.6*  ALBUMIN  2.7* 2.9* 2.9*  AST 18 18 18   ALT 11 13 12   ALKPHOS 30* 28* 26*  BILITOT 0.8 0.7 0.7   Lipids No results for input(s): CHOL, TRIG, HDL, LABVLDL, LDLCALC, CHOLHDL in the last 168 hours.  Hematology Recent Labs  Lab 06/02/24 0259 06/03/24 0452 06/04/24 0357  WBC 6.7 7.1 7.6  RBC 3.54* 3.63* 3.34*  HGB 10.6* 11.0* 10.0*  HCT 32.2* 32.8* 30.2*  MCV 91.0 90.4 90.4  MCH 29.9 30.3 29.9  MCHC 32.9 33.5 33.1  RDW 14.2 14.2 14.3  PLT 200 225 206   Thyroid  No results for input(s): TSH, FREET4 in the last 168 hours.  BNP Recent Labs  Lab 05/31/24 2019  BNP 1,207.5*    DDimer No results for input(s): DDIMER in the last 168 hours.  Radiology/Studies:  DG C-Arm 1-60 Min-No Report Result Date: 06/03/2024 Fluoroscopy was utilized by the requesting physician.  No radiographic interpretation.   DG Chest 2 View Result Date: 06/02/2024 CLINICAL DATA:  Rales EXAM: CHEST - 2 VIEW COMPARISON:  Chest radiograph dated 05/31/2024 FINDINGS: Lines/tubes: Left chest wall ICD lead projects over the right ventricle. Lungs: Low lung volumes with bronchovascular crowding. Increased diffuse interstitial opacity. Pleura: Blunting of bilateral costophrenic angles.  No pneumothorax. Heart/mediastinum: Similar enlarged cardiomediastinal silhouette. Bones: No acute osseous abnormality. IMPRESSION: 1. Increased diffuse interstitial opacity, which may represent pulmonary edema. 2. Blunting of bilateral costophrenic angles, which may represent small pleural effusions. Electronically Signed    By: Limin  Xu M.D.   On: 06/02/2024 16:15   CT ABDOMEN PELVIS WO CONTRAST Result Date: 05/31/2024 EXAM: CT ABDOMEN AND PELVIS WITHOUT CONTRAST 05/31/2024 10:34:00 PM TECHNIQUE: CT of the abdomen and pelvis was performed without the administration of intravenous contrast. Multiplanar reformatted images are provided for review. Automated exposure control, iterative reconstruction, and/or weight-based adjustment of the mA/kV was utilized to reduce the radiation dose to as low as reasonably achievable. COMPARISON: Abdominal radiograph 05/05/2017 and CT abdomen and pelvis 01/04/2006. CLINICAL HISTORY: Abdominal pain, acute, nonlocalized; P.o. intolerance. FINDINGS: LOWER CHEST: Small bilateral pleural effusions, greater on the right. Diffuse interstitial reticular nodular and alveolar infiltrates in the lung bases. This probably represents edema, possibly with underlying chronic interstitial lung disease. Pneumonia or aspiration could also have this pattern. Cardiac enlargement. LIVER: The liver is unremarkable. GALLBLADDER AND BILE DUCTS: Gallbladder is unremarkable. No biliary ductal dilatation. SPLEEN: No acute abnormality. PANCREAS: No acute abnormality. ADRENAL GLANDS: No acute abnormality. KIDNEYS, URETERS AND BLADDER: Right kidney: 9 mm stone in the mid right ureter at the level of L4 with prominent hydronephrosis and hydroureter and stranding around the right kidney. Distal right ureter is decompressed. Left kidney: 3.3 cm cyst on the left kidney. No imaging follow-up is indicated. Per consensus, no follow-up is needed for simple Bosniak type 1 and 2 renal cysts, unless the patient has a malignancy history or risk factors. Left kidney and left ureter are otherwise unremarkable. Urinary bladder is unremarkable. GI AND BOWEL: The stomach, small bowel, and colon are not abnormally distended. No wall thickening or inflammatory stranding is appreciated. The appendix is  normal. There is no bowel obstruction.  PERITONEUM AND RETROPERITONEUM: No ascites. No free air. VASCULATURE: Calcification of the aorta. Aorta is normal in caliber. No aneurysm. LYMPH NODES: No lymphadenopathy. REPRODUCTIVE ORGANS: The prostate gland is mildly enlarged. BONES AND SOFT TISSUES: Degenerative changes in the spine. No acute osseous abnormality. No focal soft tissue abnormality. IMPRESSION: 1. 9 mm stone in the mid right ureter at the level of L4 with prominent hydronephrosis, hydroureter, and perinephric stranding. Distal ureter is decompressed. 2. Small bilateral pleural effusions, greater on the right, with diffuse interstitial, reticulonodular, and alveolar infiltrates in the lung bases, which may reflect edema, chronic interstitial lung disease, pneumonia, or aspiration. 3. Cardiac enlargement. Electronically signed by: Elsie Gravely MD 05/31/2024 10:43 PM EST RP Workstation: [id number]   DG Chest 2 View Result Date: 05/31/2024 EXAM: 2 VIEW(S) XRAY OF THE CHEST 05/31/2024 08:17:00 PM COMPARISON: 05/30/2024 CLINICAL HISTORY: bradycardia bradycardia FINDINGS: LINES, TUBES AND DEVICES: Left AICD remains in place, unchanged. LUNGS AND PLEURA: Worsening interstitial opacities throughout the lungs, likely interstitial edema. No pleural effusion. No pneumothorax. HEART AND MEDIASTINUM: Cardiomegaly. Left AICD remains in place, unchanged. BONES AND SOFT TISSUES: No acute osseous abnormality. IMPRESSION: 1. Worsening interstitial opacities throughout the lungs, likely interstitial edema. 2. Cardiomegaly. Electronically signed by: Franky Crease MD 05/31/2024 08:21 PM EST RP Workstation: [id number]     Assessment and Plan:  Acute on Chronic HFrEF Patient has a history of chronic HFrEF.  Recent outpatient echo on 05/29/2024 showed  LVEF of 30-35% with global hypokinesis, mildly reduced RV function with severely elevated PASP of 68 mmHg, mild biatrial enlargement, s/p mitral valve replacement with mild MR, and mild TR. He now presents with  persistent nausea/vomiting, poor PO intake, and AKI.  He was initially treated with IV fluids for AKI and initial concern for pneumonia.  However, BNP elevated in the 1200s and repeat imaging was more consistent with CHF.  Therefore, he was started on IV Lasix .  He is still net positive 4.8 L at this time. - He has mild crackles noted in bilateral bases but otherwise does not appear markedly volume overloaded. Although, weight is up 10 lbs from last visit in 02/2024. - Renal function is slowly improving.  Continue IV Lasix  40 mg twice daily.  Hopefully, now that he has undergone right ureteral stenting for treatment of right ureteral stone with hydronephrosis, renal function will continue to improve. - Will continue to hold home Entresto , Spironolactone , and Farxiga  given AKI. - Continue Toprol -XL 100mg  daily. - Continue to monitor daily weights, strict I/Os, and renal function. - Looks like he has likely been in atrial fibrillation since 05/14/2024 so this may have been the cause of current exacerbation. - Will plan for RHC likely tomorrow to make sure he is not in low output and to more accurately assess his volume status given reports of persistent nausea/ vomiting and early satiety.   CAD S/p BMS to RCA in 2014.  Last cath in 08/2023 showed patent stent with otherwise only mild nonobstructive disease. - No chest pain. - Currently on Plavix  given history of CVA.  However, we will stop this given plans to start DOAC. - Continue Lipitor 40 mg daily.  Persistent Atrial Fibrillation Patient has a history of postop atrial fibrillation after mitral valve repair in 2014.  He was initially started on anticoagulation at that time but was unable to tolerate Eliquis  due to headache and Xarelto  due to a spontaneous thigh hematoma.  Therefore anticoagulation was stopped and a loop recorder  was placed to monitor for recurrence.  It does not look like he has had any recurrence until recently.  Recent remote  transmission of his ICD showed that it looks like he has been in atrial fibrillation since 05/14/2024. - Rate controlled.  - Continue Toprol -XL 100mg  daily. - Continue Amiodarone  200mg  twice daily. He was recently started on this as an outpatient but had not been consistently taking it. - CHA2S2-VASc = 8 (CAD, CHF, HTN, DM, CVA x2, age x2). He is willing to retry anticoagulation. Will place him on IV Heparin  for now in anticipation for RHC and then will plan to start Eliquis  prior to discharge.  Frequent PVCs Zio monitor in 10/2023 showed PVC 14.3% burden. He was started on Mexiletine but was unable to tolerate this due to nausea/ vomiting. He was recently switched to Amiodarone  but has not been taking it consistently.  - Continue Amiodarone  200mg  twice daily. - Continue Toprol -XL 100mg  daily. - Continue to monitor on telemetry  S/p mitral valve repair History of mitral valve repair in 2014 in setting of subacute bacterial endocarditis.  Most recent Echo earlier this admission showed mild MR. - Can continue to monitor as an outpatient.  Hypertension BP soft at times but stable. - Continue Toprol -XL 100mg  daily. - Entresto  and Spironolactone  on hold due to AKI.  Hyperlipidemia - Continue Lipitor 40mg  daily.  AKI on CKD Stage IIIb Creatinine 3.22 on admission. Recent baseline around 2.0. Work-up did reveal a right ureteral obstruction and she underewnt stenting on 11/29.  - Creatinine slowly improving - 2.73. - Continue to monitor closely with diuresis.  Otherwise, per primary team: - Type 2 diabetes mellitus  - Persistent nausea/vomiting - Right hydronephrosis due to ureteral stone - Obstructive sleep apnea    Risk Assessment/Risk Scores:   New [place]  Heart Association (NYHA) Functional Class NYHA Class II  CHA2DS2-VASc Score = 7  This indicates a 11.2% annual risk of stroke. The patient's score is based upon: CHF History: 1 HTN History: 1 Diabetes History: 1 Stroke  History: 2 Vascular Disease History: 1 Age Score: 1 Gender Score: 0   For questions or updates, please contact [facility] Please consult [email or web address] for contact info under      Signed, Norvell Ureste E Jedrek Dinovo, PA-C  06/04/2024 5:04 PM

## 2024-06-04 NOTE — Progress Notes (Signed)
 PHARMACY - ANTICOAGULATION CONSULT NOTE  Pharmacy Consult for Heparin  Indication: atrial fibrillation  Allergies  Allergen Reactions   Codeine Other (See Comments)    Causes bad constipation   Dust Mite Extract Cough   Pollen Extract Cough    Patient Measurements: Height: 5' 10 (177.8 cm) Weight: 108.4 kg (239 lb) IBW/kg (Calculated) : 73 HEPARIN  DW (KG): 96.4  Vital Signs: Temp: 98 F (36.7 C) (11/30 1617) BP: 128/83 (11/30 1617) Pulse Rate: 78 (11/30 1617)  Labs: Recent Labs    06/02/24 0259 06/03/24 0452 06/04/24 0357  HGB 10.6* 11.0* 10.0*  HCT 32.2* 32.8* 30.2*  PLT 200 225 206  CREATININE 2.83* 2.82* 2.73*    Estimated Creatinine Clearance: 29.3 mL/min (A) (by C-G formula based on SCr of 2.73 mg/dL (H)).   Medical History: Past Medical History:  Diagnosis Date   Arthritis    Atrial fibrillation (HCC)    post op, intol of anticoag   Cardiac arrest [facility])    a. s/p MDT single chamber ICD; 11-10-18- pt denies having a heart attack   Cataract    CHF (congestive heart failure) (HCC)    Chronic kidney disease    kidney stone   Coronary artery disease    a. 2/7 Cath: LM nl, LAD min irregs, LCX min irregs, RI 40, RCA 27m, EF 55-60% basal to mid inf HK, 3-4+ MR;  b. 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 Mitral valve prolapse with flail segment of posterior leaflet and severe MR by TEE, remote h/o bacterial endocarditis    Sleep apnea    NPSG 01/21/06- AHI  40.7/hr cpap   Stroke [facility])    summer 2022- one messed up vision, the other balance   Subacute bacterial endocarditis 03/22/2008   Strep viridans   Ventricular fibrillation (HCC) 11/07/2019   appropriate shock (36J) for VF delivered    Medications:  Infusions:   Assessment: 73 YOM. Extensive cardiac history including paroxysmal afib not on anticoag due to prior thigh hematoma on Xarelto  and headache with Eliquis . Presented for elevated Scr and AKI secondary to nausea and vomiting. Hemoglobin stable.  Pharmacy consulted to dose heparin  for current persistent atrial fibrillation in anticipation of right heart cath eventual plan to restart eliquis  prior to discharge.  Goal of Therapy:  Heparin  level 0.3-0.7 units/ml Monitor platelets by anticoagulation protocol: Yes   Plan:  Give 3000 units bolus x 1, reduced bolus since patient received lovenox  30 mg this AM. Start heparin  infusion at 1400 units/hr Check anti-Xa level in 8 hours and daily while on heparin  Continue to monitor H&H and platelets  Larraine Brazier, PharmD Clinical Pharmacist 06/04/2024  5:44 PM **Pharmacist phone directory can now be found on [email or web address] ([email or web address]).  Listed under [facility].

## 2024-06-04 NOTE — Progress Notes (Signed)
 Urology Inpatient Progress Report  Nephrolithiasis [N20.0] Acute on chronic kidney failure [N17.9, N18.9] AKI (acute kidney injury) [N17.9]  Procedure(s): CYSTOSCOPY, WITH RETROGRADE PYELOGRAM AND URETERAL STENT INSERTION  1 Day Post-Op   Intv/Subj: No acute events overnight. Patient is without complaint.  Creatinine mildly improved.  No complaints.  Principal Problem:   AKI on CKD IIIb Active Problems:   Essential hypertension   Chronic systolic heart failure (HCC)   AF (paroxysmal atrial fibrillation) (HCC)   Coronary artery disease involving native coronary artery of native heart without angina pectoris   OSA on CPAP   Diabetes mellitus type 2 with complications (HCC)   Right hydronephrosis due to ureteral stone   Class 1 obesity   Normocytic anemia  Current Facility-Administered Medications  Medication Dose Route Frequency Provider Last Rate Last Admin   acetaminophen  (TYLENOL ) tablet 500 mg  500 mg Oral Q6H PRN Pahwani, Ravi, MD   500 mg at 06/02/24 1953   amiodarone  (PACERONE ) tablet 200 mg  200 mg Oral BID Pahwani, Ravi, MD   200 mg at 06/04/24 9162   Followed by   NOREEN ON 06/29/2024] amiodarone  (PACERONE ) tablet 200 mg  200 mg Oral Daily Pahwani, Fredia, MD       atorvastatin  (LIPITOR) tablet 40 mg  40 mg Oral Daily Pahwani, Ravi, MD   40 mg at 06/04/24 0836   clopidogrel  (PLAVIX ) tablet 75 mg  75 mg Oral Daily Pahwani, Ravi, MD   75 mg at 06/04/24 0836   enoxaparin  (LOVENOX ) injection 30 mg  30 mg Subcutaneous Daily Pahwani, Ravi, MD   30 mg at 06/04/24 0837   fenofibrate  tablet 54 mg  54 mg Oral Daily Vernon Fredia, MD   54 mg at 06/04/24 0836   furosemide  (LASIX ) injection 40 mg  40 mg Intravenous BID Jonel Lonni SQUIBB, MD   40 mg at 06/04/24 1207   insulin  aspart (novoLOG ) injection 0-15 Units  0-15 Units Subcutaneous TID WC Vernon Fredia, MD   3 Units at 06/04/24 9162   insulin  aspart (novoLOG ) injection 0-5 Units  0-5 Units Subcutaneous QHS Vernon Fredia,  MD       metoprolol  succinate (TOPROL -XL) 24 hr tablet 100 mg  100 mg Oral Daily Pahwani, Ravi, MD   100 mg at 06/04/24 9160   ondansetron  (ZOFRAN ) tablet 4 mg  4 mg Oral Q6H PRN Vernon Fredia, MD       Or   ondansetron  (ZOFRAN ) injection 4 mg  4 mg Intravenous Q6H PRN Pahwani, Ravi, MD   4 mg at 06/02/24 1814   Oral care mouth rinse  15 mL Mouth Rinse PRN Vernon Fredia, MD       sodium chloride  flush (NS) 0.9 % injection 3 mL  3 mL Intravenous Q12H Vernon Fredia, MD   3 mL at 06/04/24 9161   tamsulosin  (FLOMAX ) capsule 0.4 mg  0.4 mg Oral Daily Vernon Fredia, MD   0.4 mg at 06/04/24 0836     Objective: Vital: Vitals:   06/03/24 1630 06/03/24 2038 06/04/24 0525 06/04/24 0754  BP: (!) 111/95 115/60 117/78 (!) 135/111  Pulse: 84 90 95 (!) 108  Resp: 18 18 18 18   Temp: 98.2 F (36.8 C) 98.3 F (36.8 C) 98.4 F (36.9 C) 98.3 F (36.8 C)  TempSrc:  Oral Oral   SpO2: 98% 97% 99% 95%  Weight:      Height:       I/Os: I/O last 3 completed shifts: In: 1230 [P.O.:480; I.V.:300; IV Piggyback:450] Out: 201 [  Urine:200; Blood:1]  Physical Exam:  General: Patient is in no apparent distress Lungs: Normal respiratory effort, chest expands symmetrically. GI: The abdomen is soft and nontender without mass. Ext: lower extremities symmetric  Lab Results: Recent Labs    06/02/24 0259 06/03/24 0452 06/04/24 0357  WBC 6.7 7.1 7.6  HGB 10.6* 11.0* 10.0*  HCT 32.2* 32.8* 30.2*   Recent Labs    06/02/24 0259 06/03/24 0452 06/04/24 0357  NA 139 141 136  K 3.6 3.7 3.7  CL 105 106 106  CO2 24 26 21*  GLUCOSE 130* 101* 126*  BUN 35* 32* 31*  CREATININE 2.83* 2.82* 2.73*  CALCIUM  8.3* 8.4* 8.3*   No results for input(s): LABPT, INR in the last 72 hours. No results for input(s): LABURIN in the last 72 hours. Results for orders placed or performed during the hospital encounter of 05/31/24  Resp panel by RT-PCR (RSV, Flu A&B, Covid) Anterior Nasal Swab     Status: None    Collection Time: 05/31/24 11:43 PM   Specimen: Anterior Nasal Swab  Result Value Ref Range Status   SARS Coronavirus 2 by RT PCR NEGATIVE NEGATIVE Final   Influenza A by PCR NEGATIVE NEGATIVE Final   Influenza B by PCR NEGATIVE NEGATIVE Final    Comment: (NOTE) The Xpert Xpress SARS-CoV-2/FLU/RSV plus assay is intended as an aid in the diagnosis of influenza from Nasopharyngeal swab specimens and should not be used as a sole basis for treatment. Nasal washings and aspirates are unacceptable for Xpert Xpress SARS-CoV-2/FLU/RSV testing.  Fact Sheet for Patients: bloggercourse.com  Fact Sheet for Healthcare Providers: seriousbroker.it  This test is not yet approved or cleared by the [place]  FDA and has been authorized for detection and/or diagnosis of SARS-CoV-2 by FDA under an Emergency Use Authorization (EUA). This EUA will remain in effect (meaning this test can be used) for the duration of the COVID-19 declaration under Section 564(b)(1) of the Act, 21 U.S.C. section 360bbb-3(b)(1), unless the authorization is terminated or revoked.     Resp Syncytial Virus by PCR NEGATIVE NEGATIVE Final    Comment: (NOTE) Fact Sheet for Patients: bloggercourse.com  Fact Sheet for Healthcare Providers: seriousbroker.it  This test is not yet approved or cleared by the [place]  FDA and has been authorized for detection and/or diagnosis of SARS-CoV-2 by FDA under an Emergency Use Authorization (EUA). This EUA will remain in effect (meaning this test can be used) for the duration of the COVID-19 declaration under Section 564(b)(1) of the Act, 21 U.S.C. section 360bbb-3(b)(1), unless the authorization is terminated or revoked.  Performed at [facility], [address] [place], [place] [address]   Culture, blood (routine x 2) Call MD if unable to obtain prior to antibiotics  being given     Status: None (Preliminary result)   Collection Time: 06/01/24  4:08 AM   Specimen: BLOOD RIGHT ARM  Result Value Ref Range Status   Specimen Description BLOOD RIGHT ARM  Final   Special Requests   Final    BOTTLES DRAWN AEROBIC AND ANAEROBIC Blood Culture adequate volume   Culture   Final    NO GROWTH 3 DAYS Performed at [facility], [address], [place], [place] [address]    Report Status PENDING  Incomplete  Culture, blood (routine x 2) Call MD if unable to obtain prior to antibiotics being given     Status: None (Preliminary result)   Collection Time: 06/01/24  4:16 AM  Specimen: BLOOD RIGHT HAND  Result Value Ref Range Status   Specimen Description BLOOD RIGHT HAND  Final   Special Requests   Final    BOTTLES DRAWN AEROBIC AND ANAEROBIC Blood Culture results may not be optimal due to an inadequate volume of blood received in culture bottles   Culture   Final    NO GROWTH 3 DAYS Performed at [facility], [address], [place], [place] [address]    Report Status PENDING  Incomplete  Surgical pcr screen     Status: None   Collection Time: 06/03/24  1:25 AM   Specimen: Nasal Mucosa; Nasal Swab  Result Value Ref Range Status   MRSA, PCR NEGATIVE NEGATIVE Final   Staphylococcus aureus NEGATIVE NEGATIVE Final    Comment: (NOTE) The Xpert SA Assay (FDA approved for NASAL specimens in patients 53 years of age and older), is one component of a comprehensive surveillance program. It is not intended to diagnose infection nor to guide or monitor treatment. Performed at [facility], [address] [place], [place] [address]     Studies/Results: DG C-Arm 1-60 Min-No Report Result Date: 06/03/2024 Fluoroscopy was utilized by the requesting physician.  No radiographic interpretation.    Assessment: Right ureteral calculus Right ureteral obstruction secondary to calculus Acute renal insufficiency  Procedure(s): CYSTOSCOPY, WITH  RETROGRADE PYELOGRAM AND URETERAL STENT INSERTION, 1 Day Post-Op  doing well.  Plan: No further intervention inpatient.  Okay for discharge from my standpoint when ready otherwise.  Will arrange for outpatient ureteroscopy in a couple of weeks or so.   Sherwood Edison, MD Urology 06/04/2024, 12:20 PM

## 2024-06-05 ENCOUNTER — Ambulatory Visit

## 2024-06-05 ENCOUNTER — Other Ambulatory Visit (HOSPITAL_COMMUNITY): Payer: Self-pay

## 2024-06-05 ENCOUNTER — Telehealth (HOSPITAL_COMMUNITY): Payer: Self-pay

## 2024-06-05 ENCOUNTER — Encounter (HOSPITAL_COMMUNITY): Admission: EM | Disposition: A | Payer: Self-pay | Source: Home / Self Care | Attending: Family Medicine

## 2024-06-05 ENCOUNTER — Inpatient Hospital Stay (HOSPITAL_COMMUNITY)

## 2024-06-05 DIAGNOSIS — I493 Ventricular premature depolarization: Secondary | ICD-10-CM | POA: Diagnosis not present

## 2024-06-05 DIAGNOSIS — N179 Acute kidney failure, unspecified: Secondary | ICD-10-CM | POA: Diagnosis not present

## 2024-06-05 DIAGNOSIS — I5041 Acute combined systolic (congestive) and diastolic (congestive) heart failure: Secondary | ICD-10-CM | POA: Diagnosis not present

## 2024-06-05 DIAGNOSIS — I5023 Acute on chronic systolic (congestive) heart failure: Secondary | ICD-10-CM | POA: Diagnosis not present

## 2024-06-05 DIAGNOSIS — I4819 Other persistent atrial fibrillation: Secondary | ICD-10-CM | POA: Diagnosis not present

## 2024-06-05 DIAGNOSIS — N1832 Chronic kidney disease, stage 3b: Secondary | ICD-10-CM | POA: Diagnosis not present

## 2024-06-05 HISTORY — PX: RIGHT HEART CATH: CATH118263

## 2024-06-05 LAB — COMPREHENSIVE METABOLIC PANEL WITH GFR
ALT: 13 U/L (ref 0–44)
AST: 23 U/L (ref 15–41)
Albumin: 3 g/dL — ABNORMAL LOW (ref 3.5–5.0)
Alkaline Phosphatase: 30 U/L — ABNORMAL LOW (ref 38–126)
Anion gap: 7 (ref 5–15)
BUN: 29 mg/dL — ABNORMAL HIGH (ref 8–23)
CO2: 26 mmol/L (ref 22–32)
Calcium: 8.2 mg/dL — ABNORMAL LOW (ref 8.9–10.3)
Chloride: 105 mmol/L (ref 98–111)
Creatinine, Ser: 2.67 mg/dL — ABNORMAL HIGH (ref 0.61–1.24)
GFR, Estimated: 24 mL/min — ABNORMAL LOW (ref >60)
Glucose, Bld: 115 mg/dL — ABNORMAL HIGH (ref 70–99)
Potassium: 3.7 mmol/L (ref 3.5–5.1)
Sodium: 138 mmol/L (ref 135–145)
Total Bilirubin: 1.1 mg/dL (ref 0.0–1.2)
Total Protein: 5.9 g/dL — ABNORMAL LOW (ref 6.5–8.1)

## 2024-06-05 LAB — POCT I-STAT 7, (LYTES, BLD GAS, ICA,H+H)
Acid-Base Excess: 0 mmol/L (ref 0.0–2.0)
Acid-base deficit: 1 mmol/L (ref 0.0–2.0)
Bicarbonate: 22.4 mmol/L (ref 20.0–28.0)
Bicarbonate: 23.8 mmol/L (ref 20.0–28.0)
Calcium, Ion: 1.04 mmol/L — ABNORMAL LOW (ref 1.15–1.40)
Calcium, Ion: 1.09 mmol/L — ABNORMAL LOW (ref 1.15–1.40)
HCT: 29 % — ABNORMAL LOW (ref 39.0–52.0)
HCT: 30 % — ABNORMAL LOW (ref 39.0–52.0)
Hemoglobin: 10.2 g/dL — ABNORMAL LOW (ref 13.0–17.0)
Hemoglobin: 9.9 g/dL — ABNORMAL LOW (ref 13.0–17.0)
O2 Saturation: 55 %
O2 Saturation: 56 %
Potassium: 3.6 mmol/L (ref 3.5–5.1)
Potassium: 3.8 mmol/L (ref 3.5–5.1)
Sodium: 141 mmol/L (ref 135–145)
Sodium: 143 mmol/L (ref 135–145)
TCO2: 23 mmol/L (ref 22–32)
TCO2: 25 mmol/L (ref 22–32)
pCO2 arterial: 33.4 mmHg (ref 32–48)
pCO2 arterial: 35.4 mmHg (ref 32–48)
pH, Arterial: 7.433 (ref 7.35–7.45)
pH, Arterial: 7.435 (ref 7.35–7.45)
pO2, Arterial: 28 mmHg — CL (ref 83–108)
pO2, Arterial: 28 mmHg — CL (ref 83–108)

## 2024-06-05 LAB — MAGNESIUM: Magnesium: 2.1 mg/dL (ref 1.7–2.4)

## 2024-06-05 LAB — CBC
HCT: 32.7 % — ABNORMAL LOW (ref 39.0–52.0)
Hemoglobin: 10.7 g/dL — ABNORMAL LOW (ref 13.0–17.0)
MCH: 29.7 pg (ref 26.0–34.0)
MCHC: 32.7 g/dL (ref 30.0–36.0)
MCV: 90.8 fL (ref 80.0–100.0)
Platelets: 207 K/uL (ref 150–400)
RBC: 3.6 MIL/uL — ABNORMAL LOW (ref 4.22–5.81)
RDW: 14.6 % (ref 11.5–15.5)
WBC: 7.8 K/uL (ref 4.0–10.5)
nRBC: 0 % (ref 0.0–0.2)

## 2024-06-05 LAB — GLUCOSE, CAPILLARY
Glucose-Capillary: 116 mg/dL — ABNORMAL HIGH (ref 70–99)
Glucose-Capillary: 99 mg/dL (ref 70–99)
Glucose-Capillary: 147 mg/dL — ABNORMAL HIGH (ref 70–99)
Glucose-Capillary: 102 mg/dL — ABNORMAL HIGH (ref 70–99)
Glucose-Capillary: 107 mg/dL — ABNORMAL HIGH (ref 70–99)

## 2024-06-05 LAB — HEPARIN LEVEL (UNFRACTIONATED): Heparin Unfractionated: 0.64 [IU]/mL (ref 0.30–0.70)

## 2024-06-05 SURGERY — RIGHT HEART CATH

## 2024-06-05 MED ORDER — LIDOCAINE HCL (PF) 1 % IJ SOLN
INTRAMUSCULAR | Status: AC
  Filled 2024-06-05: qty 30

## 2024-06-05 MED ORDER — HEPARIN (PORCINE) IN NACL 1000-0.9 UT/500ML-% IV SOLN
INTRAVENOUS | Status: DC | PRN
  Administered 2024-06-05: 500 mL

## 2024-06-05 MED ORDER — LIDOCAINE HCL (PF) 1 % IJ SOLN
INTRAMUSCULAR | Status: DC | PRN
  Administered 2024-06-05: 2 mL

## 2024-06-05 MED ORDER — HEPARIN (PORCINE) 25000 UT/250ML-% IV SOLN
1400.0000 [IU]/h | INTRAVENOUS | Status: AC
  Administered 2024-06-05: 1400 [IU]/h via INTRAVENOUS
  Filled 2024-06-05: qty 250

## 2024-06-05 MED ORDER — SODIUM CHLORIDE 0.9% FLUSH: 3.0000 mL | INTRAVENOUS | Status: DC | PRN

## 2024-06-05 MED ORDER — APIXABAN 5 MG PO TABS
5.0000 mg | ORAL_TABLET | Freq: Two times a day (BID) | ORAL | Status: DC
  Administered 2024-06-05 – 2024-06-08 (×6): 5 mg via ORAL
  Filled 2024-06-05 (×6): qty 1

## 2024-06-05 MED ORDER — SODIUM CHLORIDE 0.9% FLUSH
3.0000 mL | Freq: Two times a day (BID) | INTRAVENOUS | Status: DC
  Administered 2024-06-05 – 2024-06-08 (×6): 3 mL via INTRAVENOUS

## 2024-06-05 MED ORDER — POTASSIUM CHLORIDE CRYS ER 20 MEQ PO TBCR
40.0000 meq | EXTENDED_RELEASE_TABLET | Freq: Two times a day (BID) | ORAL | Status: AC
  Administered 2024-06-05 – 2024-06-06 (×2): 40 meq via ORAL
  Filled 2024-06-05 (×2): qty 2

## 2024-06-05 MED ORDER — SODIUM CHLORIDE 0.9 % IV SOLN: 250.0000 mL | INTRAVENOUS | Status: AC | PRN

## 2024-06-05 SURGICAL SUPPLY — 4 items
CATH SWAN GANZ 7F STRAIGHT (CATHETERS) IMPLANT
GLIDESHEATH SLENDER 7FR .021G (SHEATH) IMPLANT
KIT RIGHT HEART ACIST (MISCELLANEOUS) IMPLANT
PACK CARDIAC CATHETERIZATION (CUSTOM PROCEDURE TRAY) IMPLANT

## 2024-06-05 NOTE — Discharge Instructions (Signed)

## 2024-06-05 NOTE — Progress Notes (Signed)
 No ICM remote transmission received for 06/15/2024 due to current hospitalization and next ICM transmission scheduled for 07/10/2024.

## 2024-06-05 NOTE — Progress Notes (Signed)
 PHARMACY - ANTICOAGULATION CONSULT NOTE  Pharmacy Consult for Heparin  Indication: atrial fibrillation  Allergies  Allergen Reactions   Codeine Other (See Comments)    Causes bad constipation   Dust Mite Extract Cough   Pollen Extract Cough    Patient Measurements: Height: 5' 10 (177.8 cm) Weight: 108.4 kg (239 lb) IBW/kg (Calculated) : 73 HEPARIN  DW (KG): 96.4  Vital Signs: Temp: 98.1 F (36.7 C) (12/01 1042) Temp Source: Oral (12/01 0743) BP: 119/91 (12/01 1042) Pulse Rate: 102 (12/01 1042)  Labs: Recent Labs    06/03/24 0452 06/04/24 0357 06/05/24 0410  HGB 11.0* 10.0* 10.7*  HCT 32.8* 30.2* 32.7*  PLT 225 206 207  HEPARINUNFRC  --   --  0.64  CREATININE 2.82* 2.73* 2.67*    Estimated Creatinine Clearance: 29.9 mL/min (A) (by C-G formula based on SCr of 2.67 mg/dL (H)).   Medical History: Past Medical History:  Diagnosis Date   Arthritis    Atrial fibrillation (HCC)    post op, intol of anticoag   Cardiac arrest [facility])    a. s/p MDT single chamber ICD; 11-10-18- pt denies having a heart attack   Cataract    CHF (congestive heart failure) (HCC)    Chronic kidney disease    kidney stone   Coronary artery disease    a. 2/7 Cath: LM nl, LAD min irregs, LCX min irregs, RI 40, RCA 31m, EF 55-60% basal to mid inf HK, 3-4+ MR;  b. 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 Mitral valve prolapse with flail segment of posterior leaflet and severe MR by TEE, remote h/o  bacterial endocarditis    Sleep apnea    NPSG 01/21/06- AHI 40.7/hr cpap   Stroke [facility])    summer 2022- one messed up vision, the other balance   Subacute bacterial endocarditis 03/22/2008   Strep viridans   Ventricular fibrillation (HCC) 11/07/2019   appropriate shock (36J) for VF delivered    Medications:  Infusions:   sodium chloride      heparin  Stopped (06/05/24 0928)    Assessment: 73 YOM. Extensive cardiac history including paroxysmal afib not on anticoag due to prior thigh hematoma on Xarelto  and headache with Eliquis . Presented for elevated Scr and AKI secondary to nausea and vomiting. Hemoglobin stable.  Pharmacy consulted to dose heparin  for current persistent atrial fibrillation in anticipation of right heart cath eventual plan to restart eliquis  prior to discharge.   Goal of Therapy:  Heparin  level 0.3-0.7 units/ml Monitor platelets by anticoagulation protocol: Yes   Plan:  restart heparin  1400 units/hr 2 hours post sheath removal (removed ~10:30 AM) Heparin  level in 8 hours    Petar Mucci BS, PharmD, BCPS Clinical Pharmacist 06/05/2024 11:24 AM  Contact: [phone] after 3 PM

## 2024-06-05 NOTE — Progress Notes (Signed)
 DAILY PROGRESS NOTE   Patient Name: Christopher [name] Date of Encounter: 06/05/2024 Cardiologist: Vinie JAYSON Maxcy, MD  Chief Complaint   No complaints  Patient Profile   Christopher [name] is a 73 y.o. 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 Jonel Rheta Sous for RHC this morning. This shows mildly decompensated hemodynamics - mild pulmonary hypertension with mean PAP of 31 mmHg, PCWP of 23 mmHg, CO 5.01, CI 2.23. Diuresed about 1L negative yesterday. Heart failure does not likely explain his nausea and satiety based on these hemodynamics. Continues on amiodarone  taper - PVC's and short NSVT bursts noted.  Objective   Vitals:   06/05/24 1011 06/05/24 1016 06/05/24 1021 06/05/24 1042  BP: (!) 147/88 (!) 126/106 (!) 130/91 (!) 119/91  Pulse: 94 (!) 101 97 (!) 102  Resp: (!) 22 (!) 41 (!) 24 18  Temp:    98.1 F (36.7 C)  TempSrc:      SpO2: 94% (!) 68%  99%  Weight:      Height:        Intake/Output Summary (Last 24 hours) at 06/05/2024 1438 Last data filed at 06/05/2024 1051 Gross per 24 hour  Intake 896.31 ml  Output 1775 ml  Net -878.69 ml   Filed Weights   06/01/24 0027 06/03/24 0735  Weight: 99.8 kg 108.4 kg    Physical Exam   General appearance: alert and no distress Lungs: diminished breath sounds RLL and rales bibasilar Heart: regular rate and rhythm Extremities: extremities normal, atraumatic, no cyanosis or edema Neurologic: Grossly normal  Inpatient Medications    Scheduled Meds:   amiodarone   200 mg Oral BID   Followed by   NOREEN ON 06/29/2024] amiodarone   200 mg Oral Daily   atorvastatin   40 mg Oral Daily   fenofibrate   54 mg Oral Daily   furosemide   40 mg Intravenous BID   insulin  aspart  0-15 Units Subcutaneous TID WC   insulin  aspart  0-5 Units Subcutaneous QHS   metoprolol  succinate  100 mg Oral Daily   potassium chloride   40 mEq Oral BID   sodium chloride  flush  3 mL Intravenous Q12H   sodium chloride  flush  3 mL Intravenous Q12H   tamsulosin   0.4 mg Oral Daily    Continuous Infusions:  sodium chloride      heparin  1,400 Units/hr (06/05/24 1242)    PRN Meds: sodium chloride , acetaminophen , lip balm, ondansetron  **OR** ondansetron  (ZOFRAN ) IV, mouth rinse, sodium chloride  flush   Labs   Results for orders placed or performed during the hospital encounter of 05/31/24 (from the past 48 hours)  Glucose, capillary     Status: Abnormal   Collection Time: 06/03/24  4:32 PM  Result Value Ref Range   Glucose-Capillary 139 (H) 70 - 99 mg/dL    Comment: Glucose reference range applies only to samples taken after fasting for at least 8 hours.  Glucose, capillary     Status: Abnormal   Collection Time: 06/03/24  8:38 PM  Result Value Ref  Range   Glucose-Capillary 137 (H) 70 - 99 mg/dL    Comment: Glucose reference range applies only to samples taken after fasting for at least 8 hours.   Comment 1 Notify RN    Comment 2 Document in Chart   CBC     Status: Abnormal   Collection Time: 06/04/24  3:57 AM  Result Value Ref Range   WBC 7.6 4.0 - 10.5 K/uL   RBC 3.34 (L) 4.22 - 5.81 MIL/uL   Hemoglobin 10.0 (L) 13.0 - 17.0 g/dL   HCT 69.7 (L) 60.9 - 47.9 %   MCV 90.4 80.0 - 100.0 fL   MCH 29.9 26.0 - 34.0 pg   MCHC 33.1 30.0 - 36.0 g/dL   RDW 85.6 88.4 - 84.4 %   Platelets 206 150 - 400 K/uL   nRBC 0.0 0.0 - 0.2 %    Comment: Performed at [facility], [address], [place], [place] [address]  Comprehensive metabolic panel with GFR     Status:  Abnormal   Collection Time: 06/04/24  3:57 AM  Result Value Ref Range   Sodium 136 135 - 145 mmol/L   Potassium 3.7 3.5 - 5.1 mmol/L   Chloride 106 98 - 111 mmol/L   CO2 21 (L) 22 - 32 mmol/L   Glucose, Bld 126 (H) 70 - 99 mg/dL    Comment: Glucose reference range applies only to samples taken after fasting for at least 8 hours.   BUN 31 (H) 8 - 23 mg/dL   Creatinine, Ser 7.26 (H) 0.61 - 1.24 mg/dL   Calcium  8.3 (L) 8.9 - 10.3 mg/dL   Total Protein 5.6 (L) 6.5 - 8.1 g/dL   Albumin  2.9 (L) 3.5 - 5.0 g/dL   AST 18 15 - 41 U/L   ALT 12 0 - 44 U/L   Alkaline Phosphatase 26 (L) 38 - 126 U/L   Total Bilirubin 0.7 0.0 - 1.2 mg/dL   GFR, Estimated 24 (L) >60 mL/min    Comment: (NOTE) Calculated using the CKD-EPI Creatinine Equation (2021)    Anion gap 9 5 - 15    Comment: Performed at [facility], [address], [place], [place] [address]  Glucose, capillary     Status: Abnormal   Collection Time: 06/04/24  7:51 AM  Result Value Ref Range   Glucose-Capillary 159 (H) 70 - 99 mg/dL    Comment: Glucose reference range applies only to samples taken after fasting for at least 8 hours.  Glucose, capillary     Status: None   Collection Time: 06/04/24 11:03 AM  Result Value Ref Range   Glucose-Capillary 96 70 - 99 mg/dL    Comment: Glucose reference range applies only to samples taken after fasting for at least 8 hours.  Glucose, capillary     Status: None   Collection Time: 06/04/24  4:12 PM  Result Value Ref Range   Glucose-Capillary 93 70 - 99 mg/dL    Comment: Glucose reference range applies only to samples taken after fasting for at least 8 hours.  Glucose, capillary     Status: Abnormal   Collection Time: 06/04/24  9:22 PM  Result Value Ref Range   Glucose-Capillary 113 (H) 70 - 99 mg/dL    Comment: Glucose reference range applies only to samples taken after fasting for at least 8 hours.  CBC     Status: Abnormal   Collection Time: 06/05/24  4:10 AM  Result Value Ref Range  WBC 7.8 4.0 - 10.5 K/uL   RBC 3.60 (L) 4.22 - 5.81 MIL/uL   Hemoglobin 10.7 (L) 13.0 - 17.0 g/dL   HCT 67.2 (L) 60.9 - 47.9 %   MCV 90.8 80.0 - 100.0 fL   MCH 29.7 26.0 - 34.0 pg   MCHC 32.7 30.0 - 36.0 g/dL   RDW 85.3 88.4 - 84.4 %   Platelets 207 150 - 400 K/uL   nRBC 0.0 0.0 - 0.2 %    Comment: Performed at [facility], [address], [place], [place] [address]  Comprehensive metabolic panel with GFR     Status: Abnormal   Collection Time: 06/05/24  4:10 AM  Result Value Ref Range   Sodium 138 135 - 145 mmol/L   Potassium 3.7 3.5 - 5.1 mmol/L   Chloride 105 98 - 111 mmol/L   CO2 26 22 - 32 mmol/L   Glucose, Bld 115 (H) 70 - 99 mg/dL    Comment: Glucose reference range applies only to samples taken after fasting for at least 8 hours.   BUN 29 (H) 8 - 23 mg/dL   Creatinine, Ser 7.32 (H) 0.61 - 1.24 mg/dL   Calcium  8.2 (L) 8.9 - 10.3 mg/dL   Total Protein 5.9 (L) 6.5 - 8.1 g/dL   Albumin  3.0 (L) 3.5 - 5.0 g/dL   AST 23 15 - 41 U/L   ALT 13 0 - 44 U/L   Alkaline Phosphatase 30 (L) 38 - 126 U/L   Total Bilirubin 1.1 0.0 - 1.2 mg/dL   GFR, Estimated 24 (L) >60 mL/min    Comment: (NOTE) Calculated using the CKD-EPI Creatinine Equation (2021)    Anion gap 7 5 - 15    Comment: Performed at [facility], [address], [place], [place] [address]  Heparin  level (unfractionated)     Status: None   Collection Time: 06/05/24  4:10 AM  Result Value Ref Range   Heparin  Unfractionated 0.64 0.30 - 0.70 IU/mL    Comment: (NOTE) The clinical reportable range upper limit is being lowered to >1.10 to align with the FDA approved guidance for the current laboratory assay.  If heparin  results are below expected values, and patient dosage has  been confirmed, suggest follow up testing of antithrombin III levels. Performed at [facility], [address], [place], [place] [address]   Magnesium      Status: None   Collection Time: 06/05/24  4:10 AM  Result Value Ref  Range   Magnesium  2.1 1.7 - 2.4 mg/dL    Comment: Performed at [facility], [address], [place], [place] [address]  Glucose, capillary     Status: Abnormal   Collection Time: 06/05/24  7:41 AM  Result Value Ref Range   Glucose-Capillary 116 (H) 70 - 99 mg/dL    Comment: Glucose reference range applies only to samples taken after fasting for at least 8 hours.  I-STAT 7, (LYTES, BLD GAS, ICA, H+H)     Status: Abnormal   Collection Time: 06/05/24 10:15 AM  Result Value Ref Range   pH, Arterial 7.435 7.35 - 7.45   pCO2 arterial 35.4 32 - 48 mmHg   pO2, Arterial 28 (LL) 83 - 108 mmHg   Bicarbonate 23.8 20.0 - 28.0 mmol/L   TCO2 25 22 - 32 mmol/L   O2 Saturation 55 %   Acid-Base Excess 0.0 0.0 - 2.0 mmol/L   Sodium 141 135 - 145 mmol/L  Potassium 3.8 3.5 - 5.1 mmol/L   Calcium , Ion 1.09 (L) 1.15 - 1.40 mmol/L   HCT 30.0 (L) 39.0 - 52.0 %   Hemoglobin 10.2 (L) 13.0 - 17.0 g/dL   Sample type ARTERIAL    Comment NOTIFIED PHYSICIAN   I-STAT 7, (LYTES, BLD GAS, ICA, H+H)     Status: Abnormal   Collection Time: 06/05/24 10:16 AM  Result Value Ref Range   pH, Arterial 7.433 7.35 - 7.45   pCO2 arterial 33.4 32 - 48 mmHg   pO2, Arterial 28 (LL) 83 - 108 mmHg   Bicarbonate 22.4 20.0 - 28.0 mmol/L   TCO2 23 22 - 32 mmol/L   O2 Saturation 56 %   Acid-base deficit 1.0 0.0 - 2.0 mmol/L   Sodium 143 135 - 145 mmol/L   Potassium 3.6 3.5 - 5.1 mmol/L   Calcium , Ion 1.04 (L) 1.15 - 1.40 mmol/L   HCT 29.0 (L) 39.0 - 52.0 %   Hemoglobin 9.9 (L) 13.0 - 17.0 g/dL   Sample type ARTERIAL    Comment NOTIFIED PHYSICIAN   Glucose, capillary     Status: None   Collection Time: 06/05/24 10:41 AM  Result Value Ref Range   Glucose-Capillary 99 70 - 99 mg/dL    Comment: Glucose reference range applies only to samples taken after fasting for at least 8 hours.  Glucose, capillary     Status: Abnormal   Collection Time: 06/05/24 12:37 PM  Result Value Ref Range   Glucose-Capillary 147 (H) 70 -  99 mg/dL    Comment: Glucose reference range applies only to samples taken after fasting for at least 8 hours.    ECG   N/A  Telemetry   AFib rate controlled with PVC's and NSVT - Personally Reviewed  Radiology    CARDIAC CATHETERIZATION Result Date: 06/05/2024 Mild pulmonary hypertension with mean PAP of 31 mmHg and PCWP of 23 mmHg-V wave of 27 mmHg. PVR 2.59 By Fick and 3.18 by Thermal Ao sat 96%, PA sat 55%: Fick cardiac output 5.01, 2.23; Thermodilution 4.09-1.82. RECOMMENDATIONS Continue management per primary cardiology team.  May still need additional diuresis but close to euvolemic. Patient had notable trace hematuria when urinating in the Cath Lab.  This dissipated seen in the floor as well. Restart IV heparin  2 hours after sheath removal. Alm MICAEL Clay, MD, MS Alm Clay, M.D., M.S. Interventional Cardiologist [facility] Pager # [phone]   Cardiac Studies   As above  Assessment   Principal Problem:   AKI on CKD IIIb Active Problems:   Essential hypertension   Acute on chronic systolic (congestive) heart failure (HCC)   AF (paroxysmal atrial fibrillation) (HCC)   Coronary artery disease involving native coronary artery of native heart without angina pectoris   OSA on CPAP   Diabetes mellitus type 2 with complications (HCC)   Right hydronephrosis due to ureteral stone   Class 1 obesity   Normocytic anemia   Persistent atrial fibrillation (HCC)   Plan   Seems to be improving with diuresis - creatinine 2.67 (down from 2.73) - would continue IV BID lasix . RHC still shows volume overload, but generally preserved CO/CI. He still reports dyspnea, cough - noted to have basilar crackles and RLL dullness. Will check CXR - in the absence of a moderate to large effusion, we could likely switch heparin  to Eliquis . I would recommend remaining on this and I can re-evaluate if he is still in afib when I see him back in the  office on 12/12. At that point, we  could consider DCCV or TEE/DCCV to try and restore sinus rhythm.   Time Spent Directly with Patient:  I have spent a total of 25 minutes with the patient reviewing hospital notes, telemetry, EKGs, labs and examining the patient as well as establishing an assessment and plan that was discussed personally with the patient.  > 50% of time was spent in direct patient care.  Length of Stay:  LOS: 5 days   Vinie KYM Maxcy, MD, [facility], FNLA, FACP  [facility]  [facility]  Medical Director of the [facility]  Cardiovascular Risk Reduction Clinic Diplomate of the American Board of Clinical Lipidology Attending Cardiologist  Direct Dial: [phone]  Fax: [phone]  Website:  [email or web address] Vinie JAYSON Maxcy 06/05/2024, 2:38 PM

## 2024-06-05 NOTE — Progress Notes (Signed)
 Cath lab staff here to transport patient to cath lab at this time. Consent obtained from patient with this nurse. Patient transported via bed with all belongings.

## 2024-06-05 NOTE — Telephone Encounter (Signed)

## 2024-06-05 NOTE — Progress Notes (Signed)
 Progress Note   Patient: Christopher [name] FMW:[id number] DOB: September 29, 1950 DOA: 05/31/2024     5 DOS: the patient was seen and examined on 06/05/2024        Brief hospital course: 73 y.o. M with sCHF EF 30-35%, hx VF arrest s/p ICD, hx MV repair due to endocarditis, AF not on AC due to thigh hematoma, HTN, CAD s/p PCI >74yrs, DM, OSA on CPAP, and obesity who presented with abnormal labs and generalized malaise.  Evidently feeling unwell for several weeks, malaise, fatigue, nausea and cough. Possibly since starting mexiletine.  Had an echo that showed EF stable at 30-35%.           Assessment and Plan: * AKI on CKD IIIb See notes from yesterday 1.3 L urine output overnight with Lasix , creatinine only marginally down to 2.67 - Continue IV Lasix  - Hold Farxiga  and metformin  for now - Continue to hold Entresto , spironolactone  for now - Trend creatinine - Avoid nephrotoxins - Will need nephrology follow-up    Acute on chronic systolic (congestive) heart failure (HCC) Probable acute on chronic systolic CHF Paroxysmal atrial fibrillation See summary from yesterday Rales resolved on exam today Breathing seems normal and he is lying flat Net -800 cc yesterday, creatinine down to 2.6 - Continue IV Lasix  40 twice daily - RHC today, further diuretics pending - Hold Farxiga , spironolactone , Entresto  - Stop mexiletine - Continue amiodarone , metoprolol  - Telemetry shows large burden of PVCs, persistent A-fib, rates mostly in the 90s - Keep K greater than 4, mag greater than 2     Right hydronephrosis due to ureteral stone Right hydro on admission due to 9mm stone.  Urology consulted, took to OR for stent on 11/29.  No significant pain, some small amount of blood in the urine, no significant concerns - Low threshold for bladder scan - Outpatient urology follow up for stent removal needed with Dr. Watt or Carolee  Diabetes mellitus type 2 with complications (HCC) Glucose controlled -  Continue sliding scale - Hold metformin   OSA on CPAP -CPAP at night  Normocytic anemia No bleeding reported or observed, likely due to chronic kidney disease  Class 1 obesity     Coronary artery disease involving native coronary artery of native heart without angina pectoris Hypertension Blood pressure normal - Hold Farxiga , Entresto , spironolactone  - Continue metoprolol  - Continue Plavix  and fenofibrate  - Hold Lipitor - Consult cardiology, appreciate recommendations, plan for RHC today          Subjective: Patient quite anxious overnight, had some slight blood in the urine, has been coughing up something dark, but he is not able to show me.  No fever.  No confusion.  No respiratory distress.  No orthopnea.     Physical Exam: BP (!) 126/90 (BP Location: Right Arm)   Pulse 89   Temp 98.3 F (36.8 C) (Oral)   Resp 18   Ht 5' 10 (1.778 m)   Wt 108.4 kg   SpO2 98%   BMI 34.29 kg/m   Adult male, lying in bed, interactive, anxious but no acute distress Irregular irregular, rate normal, no murmurs, no pitting in the extremities, JVP not visible Respiratory rate seems normal, lung sounds clear without rales or wheezes bilaterally Abdomen soft, no tenderness to palpation or guarding, no ascites or distention Attention normal, oriented x 3, face symmetric, speech fluent, moves all 4 extremities with normal strength and coordination  Data Reviewed: Basic metabolic panel shows creatinine down to 2.6 CBC shows anemia,  no leukocytosis      Family Communication:     Disposition: Status is: Inpatient         Author: Lonni SHAUNNA Dalton, MD 06/05/2024 8:53 AM  For on call review [email or web address].

## 2024-06-05 NOTE — Progress Notes (Signed)
 Heart Failure Navigator Progress Note  Assessed for Heart & Vascular TOC clinic readiness.  Patient does not meet criteria due to patient has a scheduled [facility] appointment already on 06/16/2024. No HF TOC. .   Navigator will sign off at this time.   Stephane Haddock, BSN, Scientist, Clinical (histocompatibility And Immunogenetics) Only

## 2024-06-05 NOTE — Progress Notes (Signed)
 2 Days Post-Op  Subjective: Christopher [name] is without complaints and is voiding well.  Urine is slightly bloody but without clots.  Cr is down to 2.67 which is a further decline.  ROS:  Review of Systems  All other systems reviewed and are negative.   Anti-infectives: Anti-infectives (From admission, onward)    Start     Dose/Rate Route Frequency Ordered Stop   06/03/24 0830  ceFAZolin  (ANCEF ) IVPB 2g/100 mL premix        2 g 200 mL/hr over 30 Minutes Intravenous  Once 06/02/24 1939 06/03/24 0902   05/31/24 2345  cefTRIAXone  (ROCEPHIN ) 2 g in sodium chloride  0.9 % 100 mL IVPB  Status:  Discontinued        2 g 200 mL/hr over 30 Minutes Intravenous Every 24 hours 05/31/24 2343 06/01/24 0708   05/31/24 2345  azithromycin  (ZITHROMAX ) 500 mg in sodium chloride  0.9 % 250 mL IVPB  Status:  Discontinued        500 mg 250 mL/hr over 60 Minutes Intravenous Every 24 hours 05/31/24 2343 06/01/24 0708       Current Facility-Administered Medications  Medication Dose Route Frequency Provider Last Rate Last Admin   0.9 %  sodium chloride  infusion  250 mL Intravenous PRN Goodrich, Callie E, PA-C       acetaminophen  (TYLENOL ) tablet 500 mg  500 mg Oral Q6H PRN Pahwani, Ravi, MD   500 mg at 06/02/24 1953   amiodarone  (PACERONE ) tablet 200 mg  200 mg Oral BID Pahwani, Ravi, MD   200 mg at 06/04/24 2150   Followed by   NOREEN ON 06/29/2024] amiodarone  (PACERONE ) tablet 200 mg  200 mg Oral Daily Pahwani, Ravi, MD       atorvastatin  (LIPITOR) tablet 40 mg  40 mg Oral Daily Pahwani, Ravi, MD   40 mg at 06/04/24 0836   fenofibrate  tablet 54 mg  54 mg Oral Daily Vernon Ranks, MD   54 mg at 06/04/24 0836   furosemide  (LASIX ) injection 40 mg  40 mg Intravenous BID Jonel Lonni SQUIBB, MD   40 mg at 06/04/24 1706   heparin  ADULT infusion 100 units/mL (25000 units/250mL)  1,400 Units/hr Intravenous Continuous Marten Larraine HERO, RPH 14 mL/hr at 06/04/24 1805 1,400 Units/hr at 06/04/24 1805   insulin  aspart (novoLOG )  injection 0-15 Units  0-15 Units Subcutaneous TID WC Pahwani, Ravi, MD   3 Units at 06/04/24 9162   insulin  aspart (novoLOG ) injection 0-5 Units  0-5 Units Subcutaneous QHS Vernon Ranks, MD       lip balm (CARMEX) ointment 1 Application  1 Application Topical PRN Jonel Lonni SQUIBB, MD   1 Application at 06/04/24 1933   metoprolol  succinate (TOPROL -XL) 24 hr tablet 100 mg  100 mg Oral Daily Pahwani, Ravi, MD   100 mg at 06/04/24 9160   ondansetron  (ZOFRAN ) tablet 4 mg  4 mg Oral Q6H PRN Pahwani, Ravi, MD       Or   ondansetron  (ZOFRAN ) injection 4 mg  4 mg Intravenous Q6H PRN Pahwani, Ravi, MD   4 mg at 06/02/24 1814   Oral care mouth rinse  15 mL Mouth Rinse PRN Vernon Ranks, MD       sodium chloride  flush (NS) 0.9 % injection 3 mL  3 mL Intravenous Q12H Pahwani, Ranks, MD   3 mL at 06/04/24 0838   sodium chloride  flush (NS) 0.9 % injection 3 mL  3 mL Intravenous Q12H Goodrich, Callie E, PA-C  sodium chloride  flush (NS) 0.9 % injection 3 mL  3 mL Intravenous PRN Goodrich, Callie E, PA-C       tamsulosin  (FLOMAX ) capsule 0.4 mg  0.4 mg Oral Daily Pahwani, Fredia, MD   0.4 mg at 06/04/24 0836     Objective: Vital signs in last 24 hours: Temp:  [98 F (36.7 C)-98.7 F (37.1 C)] 98.2 F (36.8 C) (12/01 0453) Pulse Rate:  [78-108] 98 (12/01 0453) Resp:  [18-20] 18 (12/01 0453) BP: (117-135)/(73-111) 122/73 (12/01 0453) SpO2:  [95 %-100 %] 100 % (12/01 0453)  Intake/Output from previous day: 11/30 0701 - 12/01 0700 In: 840.6 [P.O.:700; I.V.:140.6] Out: 1575 [Urine:1575] Intake/Output this shift: No intake/output data recorded.   Physical Exam Vitals reviewed.  Constitutional:      Appearance: Normal appearance.  Neurological:     Mental Status: He is alert.     Lab Results:  Recent Labs    06/04/24 0357 06/05/24 0410  WBC 7.6 7.8  HGB 10.0* 10.7*  HCT 30.2* 32.7*  PLT 206 207   BMET Recent Labs    06/04/24 0357 06/05/24 0410  NA 136 138  K 3.7 3.7  CL  106 105  CO2 21* 26  GLUCOSE 126* 115*  BUN 31* 29*  CREATININE 2.73* 2.67*  CALCIUM  8.3* 8.2*   PT/INR No results for input(s): LABPROT, INR in the last 72 hours. ABG No results for input(s): PHART, HCO3 in the last 72 hours.  Invalid input(s): PCO2, PO2  Studies/Results: DG C-Arm 1-60 Min-No Report Result Date: 06/03/2024 Fluoroscopy was utilized by the requesting physician.  No radiographic interpretation.     Assessment and Plan: Right ureteral stone with acute on chronic kidney disease.   HIs Cr is slowly falling after right stent insertion.   I will get him set up for ureteroscopy in the next few weeks.       LOS: 5 days    Norleen Seltzer 06/05/2024

## 2024-06-05 NOTE — Interval H&P Note (Signed)
 History and Physical Interval Note:  06/05/2024 9:59 AM  Christopher [name]  has presented today for surgery, with the diagnosis of HF.  The various methods of treatment have been discussed with the patient and family. After consideration of risks, benefits and other options for treatment, the patient has consented to  Procedure(s): RIGHT HEART CATH (N/A) as a surgical intervention.  The patient's history has been reviewed, patient examined, no change in status, stable for surgery.  I have reviewed the patient's chart and labs.  Questions were answered to the patient's satisfaction.     Alm Clay

## 2024-06-05 NOTE — Progress Notes (Signed)
 PHARMACY - ANTICOAGULATION CONSULT NOTE  Pharmacy Consult for Heparin  Indication: atrial fibrillation  Allergies  Allergen Reactions   Codeine Other (See Comments)    Causes bad constipation   Dust Mite Extract Cough   Pollen Extract Cough    Patient Measurements: Height: 5' 10 (177.8 cm) Weight: 108.4 kg (239 lb) IBW/kg (Calculated) : 73 HEPARIN  DW (KG): 96.4  Vital Signs: Temp: 98.2 F (36.8 C) (12/01 0453) Temp Source: Oral (12/01 0453) BP: 122/73 (12/01 0453) Pulse Rate: 98 (12/01 0453)  Labs: Recent Labs    06/03/24 0452 06/04/24 0357 06/05/24 0410  HGB 11.0* 10.0* 10.7*  HCT 32.8* 30.2* 32.7*  PLT 225 206 207  HEPARINUNFRC  --   --  0.64  CREATININE 2.82* 2.73*  --     Estimated Creatinine Clearance: 29.3 mL/min (A) (by C-G formula based on SCr of 2.73 mg/dL (H)).   Medical History: Past Medical History:  Diagnosis Date   Arthritis    Atrial fibrillation (HCC)    post op, intol of anticoag   Cardiac arrest [facility])    a. s/p MDT single chamber ICD; 11-10-18- pt denies having a heart attack   Cataract    CHF (congestive heart failure) (HCC)    Chronic kidney disease    kidney stone   Coronary artery disease    a. 2/7 Cath: LM nl, LAD min irregs, LCX min irregs, RI 40, RCA 71m, EF 55-60% basal to mid inf HK, 3-4+ MR;  b. 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 Extensive cardiac history including paroxysmal afib not on anticoag due to prior thigh hematoma on Xarelto  and headache with Eliquis . Presented for elevated Scr and AKI secondary to nausea and vomiting. Hemoglobin stable.  Pharmacy consulted to dose heparin  for current persistent atrial fibrillation in anticipation of right heart cath eventual plan to restart eliquis  prior to discharge.  12/1 AM update:  Heparin  level therapeutic   Goal of Therapy:  Heparin  level 0.3-0.7 units/ml Monitor platelets by anticoagulation protocol: Yes   Plan:  Cont heparin  1400 units/hr Heparin  level in 8 hours  Lynwood Mckusick, PharmD, BCPS Clinical Pharmacist Phone: [phone]

## 2024-06-06 ENCOUNTER — Encounter (HOSPITAL_COMMUNITY): Payer: Self-pay | Admitting: Cardiology

## 2024-06-06 DIAGNOSIS — N179 Acute kidney failure, unspecified: Secondary | ICD-10-CM | POA: Diagnosis not present

## 2024-06-06 DIAGNOSIS — I5023 Acute on chronic systolic (congestive) heart failure: Secondary | ICD-10-CM | POA: Diagnosis not present

## 2024-06-06 DIAGNOSIS — I4819 Other persistent atrial fibrillation: Secondary | ICD-10-CM | POA: Diagnosis not present

## 2024-06-06 DIAGNOSIS — N1832 Chronic kidney disease, stage 3b: Secondary | ICD-10-CM | POA: Diagnosis not present

## 2024-06-06 DIAGNOSIS — N170 Acute kidney failure with tubular necrosis: Secondary | ICD-10-CM | POA: Diagnosis not present

## 2024-06-06 LAB — CBC
HCT: 33.3 % — ABNORMAL LOW (ref 39.0–52.0)
Hemoglobin: 11.1 g/dL — ABNORMAL LOW (ref 13.0–17.0)
MCH: 30.1 pg (ref 26.0–34.0)
MCHC: 33.3 g/dL (ref 30.0–36.0)
MCV: 90.2 fL (ref 80.0–100.0)
Platelets: 203 K/uL (ref 150–400)
RBC: 3.69 MIL/uL — ABNORMAL LOW (ref 4.22–5.81)
RDW: 15 % (ref 11.5–15.5)
WBC: 7.3 K/uL (ref 4.0–10.5)
nRBC: 0 % (ref 0.0–0.2)

## 2024-06-06 LAB — GLUCOSE, CAPILLARY
Glucose-Capillary: 95 mg/dL (ref 70–99)
Glucose-Capillary: 103 mg/dL — ABNORMAL HIGH (ref 70–99)
Glucose-Capillary: 139 mg/dL — ABNORMAL HIGH (ref 70–99)
Glucose-Capillary: 97 mg/dL (ref 70–99)

## 2024-06-06 LAB — COMPREHENSIVE METABOLIC PANEL WITH GFR
ALT: 12 U/L (ref 0–44)
AST: 20 U/L (ref 15–41)
Albumin: 3.2 g/dL — ABNORMAL LOW (ref 3.5–5.0)
Alkaline Phosphatase: 31 U/L — ABNORMAL LOW (ref 38–126)
Anion gap: 7 (ref 5–15)
BUN: 33 mg/dL — ABNORMAL HIGH (ref 8–23)
CO2: 28 mmol/L (ref 22–32)
Calcium: 8.4 mg/dL — ABNORMAL LOW (ref 8.9–10.3)
Chloride: 104 mmol/L (ref 98–111)
Creatinine, Ser: 2.7 mg/dL — ABNORMAL HIGH (ref 0.61–1.24)
GFR, Estimated: 24 mL/min — ABNORMAL LOW (ref >60)
Glucose, Bld: 115 mg/dL — ABNORMAL HIGH (ref 70–99)
Potassium: 4.6 mmol/L (ref 3.5–5.1)
Sodium: 139 mmol/L (ref 135–145)
Total Bilirubin: 1 mg/dL (ref 0.0–1.2)
Total Protein: 6.3 g/dL — ABNORMAL LOW (ref 6.5–8.1)

## 2024-06-06 LAB — CULTURE, BLOOD (ROUTINE X 2)
Culture: NO GROWTH
Culture: NO GROWTH
Special Requests: ADEQUATE

## 2024-06-06 MED ORDER — FUROSEMIDE 10 MG/ML IJ SOLN
80.0000 mg | Freq: Two times a day (BID) | INTRAMUSCULAR | Status: DC
  Administered 2024-06-06 – 2024-06-08 (×5): 80 mg via INTRAVENOUS
  Filled 2024-06-06 (×5): qty 8

## 2024-06-06 NOTE — Plan of Care (Signed)
  Problem: Education: Goal: Individualized Educational Video(s) Outcome: Progressing   Problem: Fluid Volume: Goal: Ability to maintain a balanced intake and output will improve Outcome: Progressing   Problem: Health Behavior/Discharge Planning: Goal: Ability to identify and utilize available resources and services will improve Outcome: Progressing   Problem: Health Behavior/Discharge Planning: Goal: Ability to manage health-related needs will improve Outcome: Progressing

## 2024-06-06 NOTE — Progress Notes (Signed)
 Progress Note   Patient: Christopher [name] FMW:[id number] DOB: 1950/08/14 DOA: 05/31/2024     6 DOS: the patient was seen and examined on 06/06/2024 at 10:50AM      Brief hospital course: 73 y.o. M with sCHF EF 30-35%, hx VF arrest s/p ICD, hx MV repair due to endocarditis, history MV repair due to SBE, AF not on AC due to thigh hematoma, HTN, CAD s/p PCI >58yrs, DM, OSA on CPAP, and obesity who presented with abnormal labs and generalized malaise.  Evidently feeling unwell for several weeks, malaise, fatigue, nausea and cough. Possibly since starting mexiletine.  Had an echo that showed EF stable at 30-35%.           Assessment and Plan: * AKI on CKD IIIb Cr 1.2 until Feb this year. Worsening CHF at that time, started GDMT, since then, appears baseline has drifted up to 1.5-2.     Cr here 3.3 on admission.  CT showed right obstructive stone.  Diuretics and Entresto  held.  Given IV fluids and Cr slightly better but not resolved and so Urology consulted for decompression.     Now stent placed 11/30 but less than expected improvement after stent, so diuresis started.  Creatinine stable with diuresis.  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  [facility] monitoring showed 14% PVC burden and this was the suspected culprit and mexiletine was ordered in May.  Med dispense history suggests he started this late July.    After starting mexiletine, started to have persistent nausea, dose was reduced in Sept, but nausea continued so it was  stopped.  Via ICD interrogation earlier this month, it was noted that he had gone back into Afib on 05/14/24.    Since admission to the hospital, he is noted to have elevated BNP, bilateral infiltrates and small right-sided pleural effusion.  Right heart cath showed elevated PCWP, good cardiac output.  Started on diuresis 2 days ago, net even yesterday. - Increase IV Lasix  to 80 twice daily - Hold Farxiga , furosemide , spironolactone , Entresto  for now - Stop mexiletine - Continue metoprolol , amiodarone  - Telemetry - Keep K greater than 4, mag greater than 2    Right hydronephrosis due to ureteral stone Right hydro on admission due to 9mm stone.  Urology consulted, took to OR for stent on 11/29.  Tolerating well - Outpatient urology follow up for stent removal needed with Dr. Watt or Carolee   Paroxysmal atrial fibrillation Rate controlled.  Cardiology have switched mexiletine to amiodarone  Previously not on AC due to history of thigh hematoma. - Continue amiodarone  - Continue new Eliquis    Diabetes mellitus type 2 with complications (HCC) Glucose controlled -Hold metformin  -Continue sliding scale corrections  OSA on CPAP - CPAP at night  Normocytic anemia No bleeding reported or observed, likely due to chronic kidney disease  Class 1 obesity    Coronary artery disease involving native coronary artery of native heart without angina pectoris Hypertension BP normal - Hold Farxiga , Entresto , spironolactone  - Continue metoprolol  and furosemide  - Continue Plavix  - Hold fenofibrate  -  Continue Liptior          Subjective: Cough seems better.  Not coughing up reddish stuff any more.  No fever, no respiratory distress.  No swelling.     Physical Exam: BP 121/80 (BP Location: Left Arm)   Pulse 83   Temp 98.2 F (36.8 C)   Resp 18   Ht 5' 10 (1.778 m)   Wt 108.4 kg   SpO2 94%   BMI 34.29 kg/m   General: Pt is alert, awake, not in acute distress Cardiovascular:  RRR, nl S1-S2, no murmurs appreciated.   No LE edema.   Respiratory: Normal respiratory rate and rhythm.  Rales and diminshed at right base. Abdominal: Abdomen soft and non-tender.  No distension or HSM.   Neuro/Psych: Strength symmetric in upper and lower extremities.  Judgment and insight appear normal.   Data Reviewed: Discussed with Cardiology BMP shows stable renal insufficiency Cr stable at 2.7 K normal WBC normal, Hgb stable CXR shows persistent edema        Disposition: Status is: Inpatient 73 yo M with complex heart disease presented with vague constitutional symptoms, found to have AKI  Cause seems multifactorial, from renal stone, congestive nephropathy.  RHC performed, shows elevated LH p[ressures, Cardiology recommending at least 1 more days IV lasix .  Daily BMP  Plan home with Home health in 1-2 days        Author: Lonni SHAUNNA Dalton, MD 06/06/2024 2:20 PM  For on call review [email or web address].

## 2024-06-06 NOTE — TOC Progression Note (Signed)
 Transition of Care [facility]) - Progression Note    Patient Details  Name: Christopher [name] MRN: [id number] Date of Birth: 06/18/1951  Transition of Care [facility]) CM/SW Contact  Tom-Johnson, Madine Sarr Daphne, RN Phone Number: 06/06/2024, 1:37 PM  Clinical Narrative:     CM spoke with patient at bedside about outpatient PT recommendation. Patient states he is active with [facility] on [address], and would like to resume there. Order and referral on AVS. A copy of order will be given to patient at discharge.   Patient not Medically ready for discharge.  CM will continue to follow as patient progresses with care towards discharge.                      Expected Discharge Plan and Services                                               Social Drivers of Health (SDOH) Interventions SDOH Screenings   Food Insecurity: No Food Insecurity (05/26/2024)  Housing: Unknown (05/26/2024)  Transportation Needs: No Transportation Needs (05/26/2024)  Utilities: Not At Risk (11/23/2023)  Alcohol Screen: Low Risk  (05/26/2024)  Depression (PHQ2-9): Low Risk  (11/23/2023)  Financial Resource Strain: Low Risk  (05/26/2024)  Physical Activity: Inactive (05/26/2024)  Social Connections: Moderately Isolated (05/26/2024)  Stress: No Stress Concern Present (05/26/2024)  Tobacco Use: Medium Risk (06/03/2024)  Health Literacy: Adequate Health Literacy (11/23/2023)    Readmission Risk Interventions    06/02/2024   10:33 AM  Readmission Risk Prevention Plan  Transportation Screening Complete  PCP or Specialist Appt within 5-7 Days Complete  Home Care Screening Complete  Medication Review (RN CM) Referral to Pharmacy

## 2024-06-06 NOTE — Progress Notes (Signed)
 DAILY PROGRESS NOTE   Patient Name: Christopher [name] Date of Encounter: 06/06/2024 Cardiologist: Vinie JAYSON Maxcy, MD  Chief Complaint   Coughing  Patient Profile   Christopher [name] is a 73 y.o. 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 Jonel   Subjective   Still coughing - mild DOE. I's and O's stable overnight- creatinine unchanged. RHC shows elevated LV filling pressures and CXR shows some persistent pulmonary edema, although seems somewhat improved to me.  Objective   Vitals:   06/05/24 1042 06/05/24 1614 06/05/24 2041 06/06/24 0739  BP: (!) 119/91 103/60 113/88 121/80  Pulse: (!) 102 86 77 83  Resp: 18 18 20 18   Temp: 98.1 F (36.7 C) 98 F (36.7 C) 97.6 F (36.4 C) 98.2 F (36.8 C)  TempSrc:   Oral   SpO2: 99% 98% 100% 94%  Weight:      Height:        Intake/Output Summary (Last 24 hours) at 06/06/2024 9095 Last data filed at 06/06/2024 0000 Gross per 24 hour  Intake 561.08 ml  Output 300 ml  Net 261.08 ml   Filed Weights   06/01/24 0027 06/03/24 0735  Weight: 99.8 kg 108.4 kg    Physical Exam   General appearance: alert and no distress Lungs: diminished breath sounds RLL and rales bibasilar Heart: regular rate and rhythm Extremities: extremities normal, atraumatic, no cyanosis or edema Neurologic: Grossly normal  Inpatient Medications    Scheduled Meds:  amiodarone   200 mg Oral BID   Followed by   NOREEN ON 06/29/2024] amiodarone   200 mg Oral Daily   apixaban   5 mg Oral BID   atorvastatin    40 mg Oral Daily   fenofibrate   54 mg Oral Daily   furosemide   80 mg Intravenous BID   insulin  aspart  0-15 Units Subcutaneous TID WC   insulin  aspart  0-5 Units Subcutaneous QHS   metoprolol  succinate  100 mg Oral Daily   sodium chloride  flush  3 mL Intravenous Q12H   sodium chloride  flush  3 mL Intravenous Q12H   tamsulosin   0.4 mg Oral Daily    Continuous Infusions:  sodium chloride       PRN Meds: sodium chloride , acetaminophen , lip balm, ondansetron  **OR** ondansetron  (ZOFRAN ) IV, mouth rinse, sodium chloride  flush   Labs   Results for orders placed or performed during the hospital encounter of 05/31/24 (from the past 48 hours)  Glucose, capillary     Status: None   Collection Time: 06/04/24 11:03 AM  Result Value Ref Range   Glucose-Capillary 96 70 - 99 mg/dL    Comment: Glucose reference range applies only to samples taken after fasting for at least 8 hours.  Glucose, capillary     Status: None   Collection Time: 06/04/24  4:12 PM  Result Value Ref Range   Glucose-Capillary 93 70 - 99 mg/dL    Comment: Glucose reference range applies only to samples taken after fasting for at least 8 hours.  Glucose, capillary     Status: Abnormal  Collection Time: 06/04/24  9:22 PM  Result Value Ref Range   Glucose-Capillary 113 (H) 70 - 99 mg/dL    Comment: Glucose reference range applies only to samples taken after fasting for at least 8 hours.  CBC     Status: Abnormal   Collection Time: 06/05/24  4:10 AM  Result Value Ref Range   WBC 7.8 4.0 - 10.5 K/uL   RBC 3.60 (L) 4.22 - 5.81 MIL/uL   Hemoglobin 10.7 (L) 13.0 - 17.0 g/dL   HCT 67.2 (L) 60.9 - 47.9 %   MCV 90.8 80.0 - 100.0 fL   MCH 29.7 26.0 - 34.0 pg   MCHC 32.7 30.0 - 36.0 g/dL   RDW 85.3 88.4 - 84.4 %   Platelets 207 150 - 400 K/uL   nRBC 0.0 0.0 - 0.2 %    Comment: Performed at [facility], [address] [place], [place] [address]  Comprehensive metabolic panel with GFR     Status: Abnormal   Collection  Time: 06/05/24  4:10 AM  Result Value Ref Range   Sodium 138 135 - 145 mmol/L   Potassium 3.7 3.5 - 5.1 mmol/L   Chloride 105 98 - 111 mmol/L   CO2 26 22 - 32 mmol/L   Glucose, Bld 115 (H) 70 - 99 mg/dL    Comment: Glucose reference range applies only to samples taken after fasting for at least 8 hours.   BUN 29 (H) 8 - 23 mg/dL   Creatinine, Ser 7.32 (H) 0.61 - 1.24 mg/dL   Calcium  8.2 (L) 8.9 - 10.3 mg/dL   Total Protein 5.9 (L) 6.5 - 8.1 g/dL   Albumin  3.0 (L) 3.5 - 5.0 g/dL   AST 23 15 - 41 U/L   ALT 13 0 - 44 U/L   Alkaline Phosphatase 30 (L) 38 - 126 U/L   Total Bilirubin 1.1 0.0 - 1.2 mg/dL   GFR, Estimated 24 (L) >60 mL/min    Comment: (NOTE) Calculated using the CKD-EPI Creatinine Equation (2021)    Anion gap 7 5 - 15    Comment: Performed at [facility], [address], [place], [place] [address]  Heparin  level (unfractionated)     Status: None   Collection Time: 06/05/24  4:10 AM  Result Value Ref Range   Heparin  Unfractionated 0.64 0.30 - 0.70 IU/mL    Comment: (NOTE) The clinical reportable range upper limit is being lowered to >1.10 to align with the FDA approved guidance for the current laboratory assay.  If heparin  results are below expected values, and patient dosage has  been confirmed, suggest follow up testing of antithrombin III levels. Performed at [facility], [address] [place], [place] [address]   Magnesium      Status: None   Collection Time: 06/05/24  4:10 AM  Result Value Ref Range   Magnesium  2.1 1.7 - 2.4 mg/dL    Comment: Performed at [facility], [address] [place], [place] [address]  Glucose, capillary     Status: Abnormal   Collection Time: 06/05/24  7:41 AM  Result Value Ref Range   Glucose-Capillary 116 (H) 70 - 99 mg/dL    Comment: Glucose reference range applies only to samples taken after fasting for at least 8 hours.  I-STAT 7, (LYTES, BLD GAS, ICA, H+H)     Status: Abnormal   Collection Time:  06/05/24 10:15 AM  Result Value Ref Range   pH, Arterial 7.435  7.35 - 7.45   pCO2 arterial 35.4 32 - 48 mmHg   pO2, Arterial 28 (LL) 83 - 108 mmHg   Bicarbonate 23.8 20.0 - 28.0 mmol/L   TCO2 25 22 - 32 mmol/L   O2 Saturation 55 %   Acid-Base Excess 0.0 0.0 - 2.0 mmol/L   Sodium 141 135 - 145 mmol/L   Potassium 3.8 3.5 - 5.1 mmol/L   Calcium , Ion 1.09 (L) 1.15 - 1.40 mmol/L   HCT 30.0 (L) 39.0 - 52.0 %   Hemoglobin 10.2 (L) 13.0 - 17.0 g/dL   Sample type ARTERIAL    Comment NOTIFIED PHYSICIAN   I-STAT 7, (LYTES, BLD GAS, ICA, H+H)     Status: Abnormal   Collection Time: 06/05/24 10:16 AM  Result Value Ref Range   pH, Arterial 7.433 7.35 - 7.45   pCO2 arterial 33.4 32 - 48 mmHg   pO2, Arterial 28 (LL) 83 - 108 mmHg   Bicarbonate 22.4 20.0 - 28.0 mmol/L   TCO2 23 22 - 32 mmol/L   O2 Saturation 56 %   Acid-base deficit 1.0 0.0 - 2.0 mmol/L   Sodium 143 135 - 145 mmol/L   Potassium 3.6 3.5 - 5.1 mmol/L   Calcium , Ion 1.04 (L) 1.15 - 1.40 mmol/L   HCT 29.0 (L) 39.0 - 52.0 %   Hemoglobin 9.9 (L) 13.0 - 17.0 g/dL   Sample type ARTERIAL    Comment NOTIFIED PHYSICIAN   Glucose, capillary     Status: None   Collection Time: 06/05/24 10:41 AM  Result Value Ref Range   Glucose-Capillary 99 70 - 99 mg/dL    Comment: Glucose reference range applies only to samples taken after fasting for at least 8 hours.  Glucose, capillary     Status: Abnormal   Collection Time: 06/05/24 12:37 PM  Result Value Ref Range   Glucose-Capillary 147 (H) 70 - 99 mg/dL    Comment: Glucose reference range applies only to samples taken after fasting for at least 8 hours.  Glucose, capillary     Status: Abnormal   Collection Time: 06/05/24  4:15 PM  Result Value Ref Range   Glucose-Capillary 102 (H) 70 - 99 mg/dL    Comment: Glucose reference range applies only to samples taken after fasting for at least 8 hours.  Glucose, capillary     Status: Abnormal   Collection Time: 06/05/24  8:42 PM  Result Value  Ref Range   Glucose-Capillary 107 (H) 70 - 99 mg/dL    Comment: Glucose reference range applies only to samples taken after fasting for at least 8 hours.  CBC     Status: Abnormal   Collection Time: 06/06/24  4:15 AM  Result Value Ref Range   WBC 7.3 4.0 - 10.5 K/uL   RBC 3.69 (L) 4.22 - 5.81 MIL/uL   Hemoglobin 11.1 (L) 13.0 - 17.0 g/dL   HCT 66.6 (L) 60.9 - 47.9 %   MCV 90.2 80.0 - 100.0 fL   MCH 30.1 26.0 - 34.0 pg   MCHC 33.3 30.0 - 36.0 g/dL   RDW 84.9 88.4 - 84.4 %   Platelets 203 150 - 400 K/uL   nRBC 0.0 0.0 - 0.2 %    Comment: Performed at [facility], [address], [place], [place] [address]  Comprehensive metabolic panel with GFR     Status: Abnormal   Collection Time: 06/06/24  4:15 AM  Result Value Ref Range   Sodium 139 135 - 145  mmol/L   Potassium 4.6 3.5 - 5.1 mmol/L   Chloride 104 98 - 111 mmol/L   CO2 28 22 - 32 mmol/L   Glucose, Bld 115 (H) 70 - 99 mg/dL    Comment: Glucose reference range applies only to samples taken after fasting for at least 8 hours.   BUN 33 (H) 8 - 23 mg/dL   Creatinine, Ser 7.29 (H) 0.61 - 1.24 mg/dL   Calcium  8.4 (L) 8.9 - 10.3 mg/dL   Total Protein 6.3 (L) 6.5 - 8.1 g/dL   Albumin  3.2 (L) 3.5 - 5.0 g/dL   AST 20 15 - 41 U/L   ALT 12 0 - 44 U/L   Alkaline Phosphatase 31 (L) 38 - 126 U/L   Total Bilirubin 1.0 0.0 - 1.2 mg/dL   GFR, Estimated 24 (L) >60 mL/min    Comment: (NOTE) Calculated using the CKD-EPI Creatinine Equation (2021)    Anion gap 7 5 - 15    Comment: Performed at [facility], [address] [place], [place] [address]  Glucose, capillary     Status: None   Collection Time: 06/06/24  7:32 AM  Result Value Ref Range   Glucose-Capillary 95 70 - 99 mg/dL    Comment: Glucose reference range applies only to samples taken after fasting for at least 8 hours.    ECG   N/A  Telemetry   AFib rate controlled with PVC's and NSVT - Personally Reviewed  Radiology    DG CHEST PORT 1 VIEW Result Date:  06/05/2024 EXAM: 1 VIEW(S) XRAY OF THE CHEST 06/05/2024 02:55:00 PM COMPARISON: 06/02/2024 CLINICAL HISTORY: Dyspnea FINDINGS: LINES, TUBES AND DEVICES: Left chest AICD in place. LUNGS AND PLEURA: Low lung volumes. Diffuse interstitial opacities and patchy bibasilar opacities, similar to prior. Similar small right pleural effusion. No pneumothorax. HEART AND MEDIASTINUM: Stable cardiomegaly. Aortic atherosclerosis. Redemonstrated cardiac valve replacement. BONES AND SOFT TISSUES: No acute osseous abnormality. IMPRESSION: 1. Low lung volumes with diffuse interstitial opacities and patchy bibasilar opacities, unchanged, likely mild pulmonary edema. 2. Unchanged small right pleural effusion. 3. Stable cardiomegaly with aortic atherosclerosis, cardiac valve replacement, and left chest AICD in place. Electronically signed by: Franky Stanford MD 06/05/2024 10:05 PM EST RP Workstation: [id number]   CARDIAC CATHETERIZATION Result Date: 06/05/2024 Mild pulmonary hypertension with mean PAP of 31 mmHg and PCWP of 23 mmHg-V wave of 27 mmHg. PVR 2.59 By Fick and 3.18 by Thermal Ao sat 96%, PA sat 55%: Fick cardiac output 5.01, 2.23; Thermodilution 4.09-1.82. RECOMMENDATIONS Continue management per primary cardiology team.  May still need additional diuresis but close to euvolemic. Patient had notable trace hematuria when urinating in the Cath Lab.  This dissipated seen in the floor as well. Restart IV heparin  2 hours after sheath removal. Alm MICAEL Clay, MD, MS Alm Clay, M.D., M.S. Interventional Cardiologist [facility] Pager # [phone]   Cardiac Studies   As above  Assessment   Principal Problem:   AKI on CKD IIIb Active Problems:   Essential hypertension   Acute on chronic systolic (congestive) heart failure (HCC)   AF (paroxysmal atrial fibrillation) (HCC)   Coronary artery disease involving native coronary artery of native heart without angina pectoris   OSA on CPAP   Diabetes mellitus  type 2 with complications (HCC)   Right hydronephrosis due to ureteral stone   Class 1 obesity   Normocytic anemia   Persistent atrial fibrillation (HCC)   PVC's (premature ventricular contractions)   Plan  Seems to be improving with diuresis - creatinine 2.7- agree with plan to increase lasix  to 80 mg IV BID.  Switched to Eliquis  last night. H/H stable. D/c fenofibrate  with GFR <30, contraindicated.  Time Spent Directly with Patient:  I have spent a total of 25 minutes with the patient reviewing hospital notes, telemetry, EKGs, labs and examining the patient as well as establishing an assessment and plan that was discussed personally with the patient.  > 50% of time was spent in direct patient care.  Length of Stay:  LOS: 6 days   Vinie KYM Maxcy, MD, [facility], FNLA, FACP    [facility]  Medical Director of the Advanced Lipid Disorders &  Cardiovascular Risk Reduction Clinic Diplomate of the American Board of Clinical Lipidology Attending Cardiologist  Direct Dial: [phone]  Fax: [phone]  Website:  [email or web address] Vinie BROCKS Lainey Nelson 06/06/2024, 9:04 AM

## 2024-06-07 DIAGNOSIS — I4819 Other persistent atrial fibrillation: Secondary | ICD-10-CM | POA: Diagnosis not present

## 2024-06-07 DIAGNOSIS — N189 Chronic kidney disease, unspecified: Secondary | ICD-10-CM

## 2024-06-07 DIAGNOSIS — I493 Ventricular premature depolarization: Secondary | ICD-10-CM | POA: Diagnosis not present

## 2024-06-07 DIAGNOSIS — N179 Acute kidney failure, unspecified: Secondary | ICD-10-CM | POA: Diagnosis not present

## 2024-06-07 DIAGNOSIS — I5023 Acute on chronic systolic (congestive) heart failure: Secondary | ICD-10-CM | POA: Diagnosis not present

## 2024-06-07 LAB — CBC
HCT: 31.4 % — ABNORMAL LOW (ref 39.0–52.0)
Hemoglobin: 10.4 g/dL — ABNORMAL LOW (ref 13.0–17.0)
MCH: 29.9 pg (ref 26.0–34.0)
MCHC: 33.1 g/dL (ref 30.0–36.0)
MCV: 90.2 fL (ref 80.0–100.0)
Platelets: 200 K/uL (ref 150–400)
RBC: 3.48 MIL/uL — ABNORMAL LOW (ref 4.22–5.81)
RDW: 14.8 % (ref 11.5–15.5)
WBC: 6.6 K/uL (ref 4.0–10.5)
nRBC: 0 % (ref 0.0–0.2)

## 2024-06-07 LAB — COMPREHENSIVE METABOLIC PANEL WITH GFR
ALT: 13 U/L (ref 0–44)
AST: 19 U/L (ref 15–41)
Albumin: 2.9 g/dL — ABNORMAL LOW (ref 3.5–5.0)
Alkaline Phosphatase: 28 U/L — ABNORMAL LOW (ref 38–126)
Anion gap: 6 (ref 5–15)
BUN: 29 mg/dL — ABNORMAL HIGH (ref 8–23)
CO2: 27 mmol/L (ref 22–32)
Calcium: 8.1 mg/dL — ABNORMAL LOW (ref 8.9–10.3)
Chloride: 104 mmol/L (ref 98–111)
Creatinine, Ser: 2.51 mg/dL — ABNORMAL HIGH (ref 0.61–1.24)
GFR, Estimated: 26 mL/min — ABNORMAL LOW (ref >60)
Glucose, Bld: 101 mg/dL — ABNORMAL HIGH (ref 70–99)
Potassium: 4.2 mmol/L (ref 3.5–5.1)
Sodium: 137 mmol/L (ref 135–145)
Total Bilirubin: 0.9 mg/dL (ref 0.0–1.2)
Total Protein: 5.8 g/dL — ABNORMAL LOW (ref 6.5–8.1)

## 2024-06-07 LAB — GLUCOSE, CAPILLARY
Glucose-Capillary: 100 mg/dL — ABNORMAL HIGH (ref 70–99)
Glucose-Capillary: 96 mg/dL (ref 70–99)
Glucose-Capillary: 127 mg/dL — ABNORMAL HIGH (ref 70–99)
Glucose-Capillary: 152 mg/dL — ABNORMAL HIGH (ref 70–99)

## 2024-06-07 MED ORDER — GLUCOSE 40 % PO GEL
ORAL | Status: AC
  Filled 2024-06-07: qty 1.21

## 2024-06-07 NOTE — Progress Notes (Signed)
 Progress Note   Patient: Christopher [name] FMW:[id number] DOB: [birth year] DOA: 05/31/2024     7 DOS: the patient was seen and examined on 06/07/2024 at 10:50AM      Brief hospital course: 73 y.o. 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  Evidently feeling unwell for several weeks, malaise, fatigue, nausea and cough. Possibly since starting mexiletine.  Had an echo that showed EF stable at 30-35%.    Assessment and Plan: * AKI on CKD IIIb Cr 1.2 until Feb this year. Worsening CHF at that time, started GDMT, since then, appears baseline has drifted up to 1.5-2.     Cr here 3.3 on admission.  CT showed right obstructive stone.  Diuretics and Entresto  held.  Given IV fluids and Cr slightly better but not resolved and so Urology consulted for decompression.     Now stent placed 11/30 but less than expected improvement after stent, so diuresis started.  Creatinine stable with diuresis.  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  [facility] monitoring showed 14% PVC burden and this was the suspected culprit and mexiletine was ordered in May.  Med dispense history suggests he started this late July.    After starting mexiletine, started to have persistent nausea, dose was reduced in Sept, but nausea continued so it was stopped.  Via ICD  interrogation earlier this month, it was noted that he had gone back into Afib on 05/14/24.    Since admission to the hospital, he is noted to have elevated BNP, bilateral infiltrates and small right-sided pleural effusion.  Right heart cath showed elevated PCWP, good cardiac output.  Started on diuresis 2 days ago, net even yesterday. - Continue IV Lasix  to 80 twice daily - Hold Farxiga , furosemide , spironolactone , Entresto  for now - Stop mexiletine - Continue metoprolol , amiodarone  - Telemetry - Keep K greater than 4, mag greater than 2    Right hydronephrosis due to ureteral stone Right hydro on admission due to 9mm stone.  Urology consulted, took to OR for stent on 11/29.  Tolerating well - Outpatient urology follow up for stent removal needed with Dr. Watt or Carolee   Paroxysmal atrial fibrillation Rate controlled.  Cardiology have switched mexiletine to amiodarone  Previously not on AC due to history of thigh hematoma. - Continue amiodarone  - Continue new Eliquis    Diabetes mellitus type 2 with complications (HCC) Glucose controlled -Hold metformin  -Continue sliding scale corrections  OSA on CPAP - CPAP at night  Normocytic anemia No bleeding reported or observed, likely due to chronic kidney disease  Class 1 obesity    Coronary artery disease involving native coronary artery of native heart without angina pectoris Hypertension BP normal - Hold Farxiga , Entresto , spironolactone  - Continue metoprolol  and furosemide  - Continue Plavix  - Hold fenofibrate  - Continue Liptior  Subjective: Patient feels much better this morning.  He denies shortness of breath, cough has improved      Physical Exam: BP (!) 109/57 (BP Location: Right Arm)   Pulse 93   Temp 98.4 F (36.9 C)   Resp 19   Ht 5' 10 (1.778 m)   Wt 99.8 kg   SpO2 99%   BMI 31.57 kg/m   General: Pt is alert, awake, not in acute distress Cardiovascular: RRR, nl S1-S2, no murmurs  appreciated.   No LE edema.   Respiratory: Normal respiratory rate and rhythm.  Rales and diminshed at right base. Abdominal: Abdomen soft and non-tender.  No distension or HSM.   Neuro/Psych: Strength symmetric in upper and lower extremities.  Judgment and insight appear normal.   Data Reviewed: Discussed with Cardiology BMP shows stable renal insufficiency Cr stable at 2.7 K normal WBC normal, Hgb stable CXR shows persistent edema  CBC    Component Value Date/Time   WBC 6.6 06/07/2024 0548   RBC 3.48 (L) 06/07/2024 0548   HGB 10.4 (L) 06/07/2024 0548   HGB 14.1 06/17/2023 1253   HCT 31.4 (L) 06/07/2024 0548   HCT 42.7 06/17/2023 1253   PLT 200 06/07/2024 0548   PLT 168 06/17/2023 1253   MCV 90.2 06/07/2024 0548   MCV 90 06/17/2023 1253   MCH 29.9 06/07/2024 0548   MCHC 33.1 06/07/2024 0548   RDW 14.8 06/07/2024 0548   RDW 13.5 06/17/2023 1253   LYMPHSABS 1.5 05/31/2024 2019   MONOABS 0.6 05/31/2024 2019   EOSABS 0.3 05/31/2024 2019   BASOSABS 0.0 05/31/2024 2019   CMP     Component Value Date/Time   NA 137 06/07/2024 0548   NA 142 03/27/2024 1101   K 4.2 06/07/2024 0548   CL 104 06/07/2024 0548   CO2 27 06/07/2024 0548   GLUCOSE 101 (H) 06/07/2024 0548   BUN 29 (H) 06/07/2024 0548   BUN 24 03/27/2024 1101   CREATININE 2.51 (H) 06/07/2024 0548   CREATININE 1.07 05/03/2013 0959   CALCIUM  8.1 (L) 06/07/2024 0548   PROT 5.8 (L) 06/07/2024 0548   PROT 6.7 03/03/2024 1102   ALBUMIN  2.9 (L) 06/07/2024 0548   ALBUMIN  4.3 03/03/2024 1102   AST 19 06/07/2024 0548   ALT 13 06/07/2024 0548   ALKPHOS 28 (L) 06/07/2024 0548   BILITOT 0.9 06/07/2024 0548   BILITOT 0.6 03/03/2024 1102   GFR 18.19 (L) 05/30/2024 1531   EGFR 34 (L) 03/27/2024 1101   GFRNONAA 26 (L) 06/07/2024 0548    Disposition: Status is: Inpatient 73 yo M with complex heart disease presented with vague constitutional symptoms, found to have AKI  Cause seems multifactorial, from renal stone,  congestive nephropathy.  RHC performed, shows elevated LH p[ressures, Cardiology recommending at least 1 more days IV lasix .  Daily BMP  Plan home with Home health in 1-2 days likely Friday        Author: Landon FORBES Baller, MD 06/07/2024 9:33 AM  For on call review [email or web address].

## 2024-06-07 NOTE — Care Management Important Message (Signed)
 Important Message  Patient Details  Name: Christopher [name] MRN: [id number] Date of Birth: 1951/05/09   Important Message Given:  Yes - Medicare IM     Claretta Deed 06/07/2024, 3:18 PM

## 2024-06-07 NOTE — Progress Notes (Signed)
 DAILY PROGRESS NOTE   Patient Name: Christopher [name] Date of Encounter: 06/07/2024 Cardiologist: Vinie JAYSON Maxcy, MD  Chief Complaint   Cough improved   Patient Profile   Christopher [name] is a 73 y.o. 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 Jonel   Subjective   Net negative about 1.8L overnight. Reports some improvement in cough. Creatinine improved to 2.51 today (from 2.7). Albumin  2.9.  Objective   Vitals:   06/06/24 2017 06/07/24 0502 06/07/24 0508 06/07/24 0802  BP: 108/73 125/81  (!) 109/57  Pulse: 92 85  93  Resp: 16 18  19   Temp: 98.1 F (36.7 C) 98.4 F (36.9 C)  98.4 F (36.9 C)  TempSrc:  Oral    SpO2: 100% 95%  99%  Weight:   99.8 kg   Height:        Intake/Output Summary (Last 24 hours) at 06/07/2024 0844 Last data filed at 06/07/2024 0800 Gross per 24 hour  Intake 360 ml  Output 1850 ml  Net -1490 ml   Filed Weights   06/01/24 0027 06/03/24 0735 06/07/24 0508  Weight: 99.8 kg 108.4 kg 99.8 kg    Physical Exam   General appearance: alert and no distress Lungs: diminished breath sounds RLL and rales bibasilar Heart: regular rate and rhythm Extremities: extremities normal, atraumatic, no cyanosis or edema Neurologic: Grossly normal  Inpatient Medications    Scheduled Meds:  amiodarone   200 mg Oral BID   Followed by   NOREEN ON 06/29/2024] amiodarone   200 mg Oral Daily   apixaban   5 mg Oral BID   atorvastatin   40 mg Oral Daily   furosemide   80 mg Intravenous BID   insulin  aspart   0-15 Units Subcutaneous TID WC   insulin  aspart  0-5 Units Subcutaneous QHS   metoprolol  succinate  100 mg Oral Daily   sodium chloride  flush  3 mL Intravenous Q12H   sodium chloride  flush  3 mL Intravenous Q12H   tamsulosin   0.4 mg Oral Daily    Continuous Infusions:    PRN Meds: acetaminophen , lip balm, ondansetron  **OR** ondansetron  (ZOFRAN ) IV, mouth rinse, sodium chloride  flush   Labs   Results for orders placed or performed during the hospital encounter of 05/31/24 (from the past 48 hours)  I-STAT 7, (LYTES, BLD GAS, ICA, H+H)     Status: Abnormal   Collection Time: 06/05/24 10:15 AM  Result Value Ref Range   pH, Arterial 7.435 7.35 - 7.45   pCO2 arterial 35.4 32 - 48 mmHg   pO2, Arterial 28 (LL) 83 - 108 mmHg   Bicarbonate 23.8 20.0 - 28.0 mmol/L   TCO2 25 22 - 32 mmol/L   O2 Saturation 55 %   Acid-Base Excess 0.0 0.0 - 2.0 mmol/L   Sodium 141 135 - 145 mmol/L   Potassium 3.8 3.5 - 5.1 mmol/L   Calcium , Ion 1.09 (L) 1.15 - 1.40 mmol/L   HCT 30.0 (L) 39.0 - 52.0 %   Hemoglobin 10.2 (L) 13.0 - 17.0 g/dL  Sample type ARTERIAL    Comment NOTIFIED PHYSICIAN   I-STAT 7, (LYTES, BLD GAS, ICA, H+H)     Status: Abnormal   Collection Time: 06/05/24 10:16 AM  Result Value Ref Range   pH, Arterial 7.433 7.35 - 7.45   pCO2 arterial 33.4 32 - 48 mmHg   pO2, Arterial 28 (LL) 83 - 108 mmHg   Bicarbonate 22.4 20.0 - 28.0 mmol/L   TCO2 23 22 - 32 mmol/L   O2 Saturation 56 %   Acid-base deficit 1.0 0.0 - 2.0 mmol/L   Sodium 143 135 - 145 mmol/L   Potassium 3.6 3.5 - 5.1 mmol/L   Calcium , Ion 1.04 (L) 1.15 - 1.40 mmol/L   HCT 29.0 (L) 39.0 - 52.0 %   Hemoglobin 9.9 (L) 13.0 - 17.0 g/dL   Sample type ARTERIAL    Comment NOTIFIED PHYSICIAN   Glucose, capillary     Status: None   Collection Time: 06/05/24 10:41 AM  Result Value Ref Range   Glucose-Capillary 99 70 - 99 mg/dL    Comment: Glucose reference range applies only to samples taken after fasting for at least 8 hours.   Glucose, capillary     Status: Abnormal   Collection Time: 06/05/24 12:37 PM  Result Value Ref Range   Glucose-Capillary 147 (H) 70 - 99 mg/dL    Comment: Glucose reference range applies only to samples taken after fasting for at least 8 hours.  Glucose, capillary     Status: Abnormal   Collection Time: 06/05/24  4:15 PM  Result Value Ref Range   Glucose-Capillary 102 (H) 70 - 99 mg/dL    Comment: Glucose reference range applies only to samples taken after fasting for at least 8 hours.  Glucose, capillary     Status: Abnormal   Collection Time: 06/05/24  8:42 PM  Result Value Ref Range   Glucose-Capillary 107 (H) 70 - 99 mg/dL    Comment: Glucose reference range applies only to samples taken after fasting for at least 8 hours.  CBC     Status: Abnormal   Collection Time: 06/06/24  4:15 AM  Result Value Ref Range   WBC 7.3 4.0 - 10.5 K/uL   RBC 3.69 (L) 4.22 - 5.81 MIL/uL   Hemoglobin 11.1 (L) 13.0 - 17.0 g/dL   HCT 66.6 (L) 60.9 - 47.9 %   MCV 90.2 80.0 - 100.0 fL   MCH 30.1 26.0 - 34.0 pg   MCHC 33.3 30.0 - 36.0 g/dL   RDW 84.9 88.4 - 84.4 %   Platelets 203 150 - 400 K/uL   nRBC 0.0 0.0 - 0.2 %    Comment: Performed at [facility], [address], [place], [place] [address]  Comprehensive metabolic panel with GFR     Status: Abnormal   Collection Time: 06/06/24  4:15 AM  Result Value Ref Range   Sodium 139 135 - 145 mmol/L   Potassium 4.6 3.5 - 5.1 mmol/L   Chloride 104 98 - 111 mmol/L   CO2 28 22 - 32 mmol/L   Glucose, Bld 115 (H) 70 - 99 mg/dL    Comment: Glucose reference range applies only to samples taken after fasting for at least 8 hours.   BUN 33 (H) 8 - 23 mg/dL   Creatinine, Ser 7.29 (H) 0.61 - 1.24 mg/dL   Calcium  8.4 (L) 8.9 - 10.3 mg/dL   Total Protein 6.3 (L) 6.5 - 8.1 g/dL   Albumin  3.2 (L) 3.5 -  5.0 g/dL   AST 20 15 - 41 U/L   ALT 12 0 - 44 U/L   Alkaline Phosphatase 31 (L) 38 - 126 U/L   Total Bilirubin 1.0 0.0 - 1.2 mg/dL   GFR, Estimated 24  (L) >60 mL/min    Comment: (NOTE) Calculated using the CKD-EPI Creatinine Equation (2021)    Anion gap 7 5 - 15    Comment: Performed at [facility], [address] [place], [place] [address]  Glucose, capillary     Status: None   Collection Time: 06/06/24  7:32 AM  Result Value Ref Range   Glucose-Capillary 95 70 - 99 mg/dL    Comment: Glucose reference range applies only to samples taken after fasting for at least 8 hours.  Glucose, capillary     Status: Abnormal   Collection Time: 06/06/24 11:19 AM  Result Value Ref Range   Glucose-Capillary 103 (H) 70 - 99 mg/dL    Comment: Glucose reference range applies only to samples taken after fasting for at least 8 hours.  Glucose, capillary     Status: Abnormal   Collection Time: 06/06/24  5:16 PM  Result Value Ref Range   Glucose-Capillary 139 (H) 70 - 99 mg/dL    Comment: Glucose reference range applies only to samples taken after fasting for at least 8 hours.  Glucose, capillary     Status: None   Collection Time: 06/06/24  9:23 PM  Result Value Ref Range   Glucose-Capillary 97 70 - 99 mg/dL    Comment: Glucose reference range applies only to samples taken after fasting for at least 8 hours.   Comment 1 Notify RN    Comment 2 Document in Chart   CBC     Status: Abnormal   Collection Time: 06/07/24  5:48 AM  Result Value Ref Range   WBC 6.6 4.0 - 10.5 K/uL   RBC 3.48 (L) 4.22 - 5.81 MIL/uL   Hemoglobin 10.4 (L) 13.0 - 17.0 g/dL   HCT 68.5 (L) 60.9 - 47.9 %   MCV 90.2 80.0 - 100.0 fL   MCH 29.9 26.0 - 34.0 pg   MCHC 33.1 30.0 - 36.0 g/dL   RDW 85.1 88.4 - 84.4 %   Platelets 200 150 - 400 K/uL   nRBC 0.0 0.0 - 0.2 %    Comment: Performed at [facility], [address] [place], [place] [address]  Comprehensive metabolic panel with GFR     Status: Abnormal   Collection Time: 06/07/24  5:48 AM  Result Value Ref Range   Sodium 137 135 - 145 mmol/L   Potassium 4.2 3.5 - 5.1 mmol/L   Chloride 104 98 - 111 mmol/L    CO2 27 22 - 32 mmol/L   Glucose, Bld 101 (H) 70 - 99 mg/dL    Comment: Glucose reference range applies only to samples taken after fasting for at least 8 hours.   BUN 29 (H) 8 - 23 mg/dL   Creatinine, Ser 7.48 (H) 0.61 - 1.24 mg/dL   Calcium  8.1 (L) 8.9 - 10.3 mg/dL   Total Protein 5.8 (L) 6.5 - 8.1 g/dL   Albumin  2.9 (L) 3.5 - 5.0 g/dL   AST 19 15 - 41 U/L   ALT 13 0 - 44 U/L   Alkaline Phosphatase 28 (L) 38 - 126 U/L   Total Bilirubin 0.9 0.0 - 1.2 mg/dL   GFR, Estimated 26 (L) >60 mL/min    Comment: (NOTE) Calculated  using the CKD-EPI Creatinine Equation (2021)    Anion gap 6 5 - 15    Comment: Performed at [facility], [address] [place], [place] [address]  Glucose, capillary     Status: Abnormal   Collection Time: 06/07/24  7:29 AM  Result Value Ref Range   Glucose-Capillary 100 (H) 70 - 99 mg/dL    Comment: Glucose reference range applies only to samples taken after fasting for at least 8 hours.    ECG   N/A  Telemetry   AFib rate controlled with PVC's and NSVT - Personally Reviewed  Radiology    DG CHEST PORT 1 VIEW Result Date: 06/05/2024 EXAM: 1 VIEW(S) XRAY OF THE CHEST 06/05/2024 02:55:00 PM COMPARISON: 06/02/2024 CLINICAL HISTORY: Dyspnea FINDINGS: LINES, TUBES AND DEVICES: Left chest AICD in place. LUNGS AND PLEURA: Low lung volumes. Diffuse interstitial opacities and patchy bibasilar opacities, similar to prior. Similar small right pleural effusion. No pneumothorax. HEART AND MEDIASTINUM: Stable cardiomegaly. Aortic atherosclerosis. Redemonstrated cardiac valve replacement. BONES AND SOFT TISSUES: No acute osseous abnormality. IMPRESSION: 1. Low lung volumes with diffuse interstitial opacities and patchy bibasilar opacities, unchanged, likely mild pulmonary edema. 2. Unchanged small right pleural effusion. 3. Stable cardiomegaly with aortic atherosclerosis, cardiac valve replacement, and left chest AICD in place. Electronically signed by: Franky Stanford MD  06/05/2024 10:05 PM EST RP Workstation: [id number]   CARDIAC CATHETERIZATION Result Date: 06/05/2024 Mild pulmonary hypertension with mean PAP of 31 mmHg and PCWP of 23 mmHg-V wave of 27 mmHg. PVR 2.59 By Fick and 3.18 by Thermal Ao sat 96%, PA sat 55%: Fick cardiac output 5.01, 2.23; Thermodilution 4.09-1.82. RECOMMENDATIONS Continue management per primary cardiology team.  May still need additional diuresis but close to euvolemic. Patient had notable trace hematuria when urinating in the Cath Lab.  This dissipated seen in the floor as well. Restart IV heparin  2 hours after sheath removal. Alm MICAEL Clay, MD, MS Alm Clay, M.D., M.S. Interventional Cardiologist [facility] Pager # [phone]   Cardiac Studies   As above  Assessment   Principal Problem:   AKI on CKD IIIb Active Problems:   Essential hypertension   Acute on chronic systolic (congestive) heart failure (HCC)   AF (paroxysmal atrial fibrillation) (HCC)   Coronary artery disease involving native coronary artery of native heart without angina pectoris   OSA on CPAP   Diabetes mellitus type 2 with complications (HCC)   Right hydronephrosis due to ureteral stone   Class 1 obesity   Normocytic anemia   Persistent atrial fibrillation (HCC)   PVC's (premature ventricular contractions)   Plan   Good urine output overnight-still with bilateral crackles - ?fluid vs fibrosis. Still feel he is volume overloaded- creatinine improved with diuresis. Continue 80 mg IV BID lasix  today -reassess tomorrow. He said the heat should also be restored at his house by tomorrow.  Time Spent Directly with Patient:  I have spent a total of 25 minutes with the patient reviewing hospital notes, telemetry, EKGs, labs and examining the patient as well as establishing an assessment and plan that was discussed personally with the patient.  > 50% of time was spent in direct patient care.  Length of Stay:  LOS: 7 days   Vinie KYM Maxcy, MD, [facility], FNLA, FACP  [place]  [facility]  Medical Director of the [facility]  [facility] Diplomate of the American Board of Clinical Lipidology Attending Cardiologist  Direct Dial: [phone]  Fax: [phone]  Website:  [email or web address] Vinie BROCKS Mirta Mally 06/07/2024, 8:44 AM

## 2024-06-07 NOTE — Care Management Important Message (Signed)
 Important Message  Patient Details  Name: Christopher [name] MRN: [id number] Date of Birth: 1950/07/10   Important Message Given:  Yes - Medicare IM     Claretta Deed 06/07/2024, 3:16 PM

## 2024-06-08 ENCOUNTER — Other Ambulatory Visit (HOSPITAL_COMMUNITY): Payer: Self-pay

## 2024-06-08 ENCOUNTER — Emergency Department (HOSPITAL_COMMUNITY)
Admission: EM | Admit: 2024-06-08 | Discharge: 2024-06-09 | Disposition: A | Attending: Emergency Medicine | Admitting: Emergency Medicine

## 2024-06-08 DIAGNOSIS — D649 Anemia, unspecified: Secondary | ICD-10-CM

## 2024-06-08 DIAGNOSIS — I10 Essential (primary) hypertension: Secondary | ICD-10-CM

## 2024-06-08 DIAGNOSIS — Z79899 Other long term (current) drug therapy: Secondary | ICD-10-CM | POA: Insufficient documentation

## 2024-06-08 DIAGNOSIS — I5023 Acute on chronic systolic (congestive) heart failure: Secondary | ICD-10-CM | POA: Diagnosis not present

## 2024-06-08 DIAGNOSIS — I130 Hypertensive heart and chronic kidney disease with heart failure and stage 1 through stage 4 chronic kidney disease, or unspecified chronic kidney disease: Secondary | ICD-10-CM | POA: Insufficient documentation

## 2024-06-08 DIAGNOSIS — G4733 Obstructive sleep apnea (adult) (pediatric): Secondary | ICD-10-CM

## 2024-06-08 DIAGNOSIS — E1122 Type 2 diabetes mellitus with diabetic chronic kidney disease: Secondary | ICD-10-CM | POA: Insufficient documentation

## 2024-06-08 DIAGNOSIS — N170 Acute kidney failure with tubular necrosis: Secondary | ICD-10-CM | POA: Diagnosis not present

## 2024-06-08 DIAGNOSIS — I2510 Atherosclerotic heart disease of native coronary artery without angina pectoris: Secondary | ICD-10-CM | POA: Insufficient documentation

## 2024-06-08 DIAGNOSIS — I480 Paroxysmal atrial fibrillation: Secondary | ICD-10-CM | POA: Insufficient documentation

## 2024-06-08 DIAGNOSIS — Z794 Long term (current) use of insulin: Secondary | ICD-10-CM | POA: Insufficient documentation

## 2024-06-08 DIAGNOSIS — Z7984 Long term (current) use of oral hypoglycemic drugs: Secondary | ICD-10-CM | POA: Insufficient documentation

## 2024-06-08 DIAGNOSIS — Z7901 Long term (current) use of anticoagulants: Secondary | ICD-10-CM | POA: Insufficient documentation

## 2024-06-08 DIAGNOSIS — I493 Ventricular premature depolarization: Secondary | ICD-10-CM | POA: Diagnosis not present

## 2024-06-08 DIAGNOSIS — E66811 Obesity, class 1: Secondary | ICD-10-CM

## 2024-06-08 DIAGNOSIS — Z7902 Long term (current) use of antithrombotics/antiplatelets: Secondary | ICD-10-CM | POA: Insufficient documentation

## 2024-06-08 DIAGNOSIS — K5909 Other constipation: Secondary | ICD-10-CM | POA: Insufficient documentation

## 2024-06-08 DIAGNOSIS — E118 Type 2 diabetes mellitus with unspecified complications: Secondary | ICD-10-CM

## 2024-06-08 DIAGNOSIS — N1832 Chronic kidney disease, stage 3b: Secondary | ICD-10-CM | POA: Insufficient documentation

## 2024-06-08 DIAGNOSIS — I4819 Other persistent atrial fibrillation: Secondary | ICD-10-CM | POA: Diagnosis not present

## 2024-06-08 LAB — COMPREHENSIVE METABOLIC PANEL WITH GFR
ALT: 16 U/L (ref 0–44)
AST: 22 U/L (ref 15–41)
Albumin: 2.9 g/dL — ABNORMAL LOW (ref 3.5–5.0)
Alkaline Phosphatase: 33 U/L — ABNORMAL LOW (ref 38–126)
Anion gap: 7 (ref 5–15)
BUN: 30 mg/dL — ABNORMAL HIGH (ref 8–23)
CO2: 25 mmol/L (ref 22–32)
Calcium: 8.2 mg/dL — ABNORMAL LOW (ref 8.9–10.3)
Chloride: 104 mmol/L (ref 98–111)
Creatinine, Ser: 2.72 mg/dL — ABNORMAL HIGH (ref 0.61–1.24)
GFR, Estimated: 24 mL/min — ABNORMAL LOW (ref >60)
Glucose, Bld: 164 mg/dL — ABNORMAL HIGH (ref 70–99)
Potassium: 3.8 mmol/L (ref 3.5–5.1)
Sodium: 136 mmol/L (ref 135–145)
Total Bilirubin: 0.9 mg/dL (ref 0.0–1.2)
Total Protein: 5.9 g/dL — ABNORMAL LOW (ref 6.5–8.1)

## 2024-06-08 LAB — CBC
HCT: 31.5 % — ABNORMAL LOW (ref 39.0–52.0)
Hemoglobin: 10.3 g/dL — ABNORMAL LOW (ref 13.0–17.0)
MCH: 29.5 pg (ref 26.0–34.0)
MCHC: 32.7 g/dL (ref 30.0–36.0)
MCV: 90.3 fL (ref 80.0–100.0)
Platelets: 204 K/uL (ref 150–400)
RBC: 3.49 MIL/uL — ABNORMAL LOW (ref 4.22–5.81)
RDW: 14.8 % (ref 11.5–15.5)
WBC: 7.5 K/uL (ref 4.0–10.5)
nRBC: 0 % (ref 0.0–0.2)

## 2024-06-08 LAB — GLUCOSE, CAPILLARY: Glucose-Capillary: 126 mg/dL — ABNORMAL HIGH (ref 70–99)

## 2024-06-08 MED ORDER — FUROSEMIDE 40 MG PO TABS: 80.0000 mg | ORAL_TABLET | Freq: Every day | ORAL | Status: DC

## 2024-06-08 MED ORDER — METFORMIN HCL 500 MG PO TABS
500.0000 mg | ORAL_TABLET | Freq: Two times a day (BID) | ORAL | 0 refills | Status: AC
  Filled 2024-06-08: qty 180, 90d supply, fill #0

## 2024-06-08 MED ORDER — APIXABAN 5 MG PO TABS
5.0000 mg | ORAL_TABLET | Freq: Two times a day (BID) | ORAL | 0 refills | Status: DC
  Filled 2024-06-08: qty 180, 90d supply, fill #0

## 2024-06-08 NOTE — Progress Notes (Signed)
 DISCHARGE NOTE HOME KINGDOM VANZANTEN to be discharged Home per MD order. Floor nurse Discussed prescriptions and follow up appointments with the patient. Prescriptions given to patient; medication list explained in detail. Patient verbalized understanding.  Skin clean, dry and intact without evidence of skin break down, no evidence of skin tears noted. IV catheter discontinued intact. Site without signs and symptoms of complications. Dressing and pressure applied. Pt denies pain at the site currently. No complaints noted. See LDA for surgical incision penis Patient free of lines, drains, and wounds.   An After Visit Summary (AVS) was printed and given to the patient. Patient escorted via wheelchair, to discharge lounge to wait for TOC meds and TAXI home.  Peyton SHAUNNA Pepper, RN

## 2024-06-08 NOTE — Discharge Summary (Signed)
 Physician Discharge Summary  Christopher [name] FMW:[id number] DOB: 01-13-1951 DOA: 05/31/2024  PCP: Christopher Almarie LABOR, MD  Admit date: 05/31/2024 Discharge date: 06/08/24  Admitted From: Home Disposition: Home Recommendations for Outpatient Follow-up:  Outpatient follow-up with PCP in 1 to 2 weeks Cardiology to arrange outpatient follow-up Check fluid status, CMP and CBC at follow-up Please follow up on the following pending results: None  Home Health: No need identified Equipment/Devices: No need identified  Discharge Condition: Stable CODE STATUS: Full code Diet Orders (From admission, onward)     Start     Ordered   06/05/24 1052  Diet Heart Room service appropriate? Yes; Fluid consistency: Thin  Diet effective now       Question Answer Comment  Room service appropriate? Yes   Fluid consistency: Thin      06/05/24 1051             Follow-up Information     [facility]   Specialty: Specialist Why: Call to schedule your appointment. Contact information: [address] [name] [address] [phone]         Christopher Almarie LABOR, MD. Schedule an appointment as soon as possible for a visit in 1 week(s).   Specialty: Internal Medicine Contact information: [address] [name] [address] [phone]         Christopher [name] III, MD. Schedule an appointment as soon as possible for a visit in 1 week(s).   Specialty: Urology Contact information: [address] [name] [address] [phone]                 Hospital course 73 y.o. 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  Patient had several weeks of, malaise, fatigue, nausea and cough. Possibly since starting mexiletine.   In ED, stable vitals.  Cr 3.1 (was 1.2 in 08/2023).  CXR with interstitial opacities throughout the lungs and cardiomegaly.  CT chest, abdomen and pelvis showed incidental 9 mm stone in right mid ureter with hydronephrosis and hydroureter and perinephric stranding and possible infiltrate at lung base which may be edema or pneumonia.  UA unremarkable.  Urology consulted, and patient was admitted for further care.  Patient underwent right ureteral stent by Dr. Carolee on 11/29.  Cardiology consulted and he was started on IV Lasix  with improvement in his symptoms.  Cr also improved some.  On the day of discharge, patient felt well and ready to go home.  He was cleared for discharge by cardiology and urology for outpatient follow-up.  Cardiac medications adjusted per recommendation by cardiology.  Cardiology to arrange outpatient follow-up for possible DCCV outpatient.  Urology to arrange outpatient follow-up for ureteroscopy.  See individual problem list below for more.   Problems addressed during this hospitalization AKI on CKD IIIb: Unknown baseline.  Creatinine is 1.2 in 08/2023.  Seems to have plateaued at 2.5-2.7. Recent Labs    05/31/24 2019 05/31/24 2029 06/01/24 0408 06/02/24 0259 06/03/24 0452 06/04/24 0357 06/05/24 0410 06/06/24 0415 06/07/24 0548 06/08/24 0429  BUN 43* 41* 40* 35* 32* 31* 29*  33* 29* 30*  CREATININE 3.10* 3.30* 2.95* 2.83* 2.82* 2.73* 2.67* 2.70* 2.51* 2.72*  - Recheck renal function at follow-up - Entresto  discontinued partly due to hypotension.  Acute on chronic HFrEF: TTE on 8/29 with LVEF of 30 to 35%, GH, indeterminate DD and RVSP of 68 mmHg.  CXR and CT chest on presentation suggested pulmonary edema and cardiomegaly.  Diuresed with IV Lasix .  Respiratory symptoms improved.  Renal function stable. -Discharged on home p.o. Lasix ,  Aldactone , Farxiga  and Toprol -XL. -Entresto  discontinued per recommendation by cardiology. -Mexiletine stopped.  Patient did not tolerate -Outpatient follow-up with cardiology.  History of VF arrest s/p AICD. History of endocarditis s/p  mitral valve repair   Right hydronephrosis due to ureteral stone: CT showed 9 mm right ureteral stone with hydronephrosis and hydroureter.   -S/p right ureteral stent by Dr. Carolee on 11/29.   - Urology to arrange outpatient follow-up for ureteroscopy      Paroxysmal atrial fibrillation: Rate controlled. -Continue amiodarone  loading dose as previously planned -Continue home metoprolol . -Started on Eliquis  by cardiology. -Cardiology to arrange outpatient follow-up for possible DCCV if he does not convert     DM-2 with hyperglycemia: A1c 5.9%. Recent Labs  Lab 06/07/24 0729 06/07/24 1113 06/07/24 1644 06/07/24 2149 06/08/24 0739  GLUCAP 100* 96 127* 152* 126*  -Decrease metformin  given his poor renal function. -Continue home Farxiga .  OSA on CPAP -CPAP at night   Normocytic anemia: Stable. -Check CBC at follow-up  History of CAD s/p PCI to RCA in 2014. -Continue, Toprol -XL, Plavix  and Lipitor.   Class 1 obesity Body mass index is 31.57 kg/m.           Consultations: Cardiology  Time spent 35  minutes  Vital signs Vitals:   06/07/24 2100 06/07/24 2140 06/07/24 2151 06/08/24 0854  BP: 92/76 92/76 106/82 119/80  Pulse: 76 76  79  Temp: 98.4 F (36.9 C) 98.4 F (36.9 C)  98.3 F (36.8 C)  Resp: 16   18  Height:      Weight:      SpO2: 98%   98%  TempSrc: Oral Oral  Oral  BMI (Calculated):         Discharge exam  GENERAL: No apparent distress.  Nontoxic. HEENT: MMM.  Vision and hearing grossly intact.  NECK: Supple.  No apparent JVD.  RESP:  No IWOB.  Fair aeration bilaterally. CVS: Irregular rhythm.  Normal rate.  Heart sounds normal.  ABD/GI/GU: BS+. Abd soft, NTND.  MSK/EXT:  Moves extremities. No apparent  deformity.  Trace BLE edema. SKIN: no apparent skin lesion or wound NEURO: Awake and alert. Oriented appropriately.  No apparent focal neuro deficit. PSYCH: Calm. Normal affect.   Discharge Instructions Discharge Instructions     Ambulatory referral to Physical Therapy   Complete by: As directed    [facility]   Discharge instructions   Complete by: As directed    It has been a pleasure taking care of you!  You were hospitalized due to acute kidney injury, kidney stone and heart failure exacerbation for which you have been treated surgically and medically.  It is very important that you take your medications as prescribed.  Follow-up with urology and cardiology per their recommendation.    Take care,   Increase activity slowly   Complete by: As directed    No wound care   Complete by: As directed       Allergies as of 06/08/2024  Reactions   Codeine Other (See Comments)   Causes bad constipation   Dust Mite Extract Cough   Pollen Extract Cough        Medication List     STOP taking these medications    doxycycline  100 MG tablet Commonly known as: VIBRA -TABS   Entresto  97-103 MG Generic drug: sacubitril -valsartan    mexiletine 150 MG capsule Commonly known as: MEXITIL       TAKE these medications    Accu-Chek Guide Test test strip Generic drug: glucose blood Use as instructed   Accu-Chek Softclix Lancets lancets 1 each by Other route See admin instructions. Use TID   acetaminophen  500 MG tablet Commonly known as: TYLENOL  Take 1,000 mg by mouth every 6 (six) hours as needed for mild pain (pain score 1-3) or headache.   amiodarone  200 MG tablet Commonly known as: PACERONE  Take 1 tablet (200 mg total) by mouth 2 (two) times daily for 30 days, THEN 1 tablet (200 mg total) daily. Start taking on: May 30, 2024   atorvastatin  40 MG tablet Commonly known as: LIPITOR Take 1 tablet (40 mg total) by mouth daily.   cetirizine 10 MG  tablet Commonly known as: ZYRTEC Take 10 mg by mouth daily.   cholecalciferol  25 MCG (1000 UNIT) tablet Commonly known as: VITAMIN D3 Take 1,000 Units by mouth daily.   clopidogrel  75 MG tablet Commonly known as: PLAVIX  Take 1 tablet (75 mg total) by mouth daily.   dapagliflozin  propanediol 10 MG Tabs tablet Commonly known as: Farxiga  Take 1 tablet (10 mg total) by mouth daily.   Eliquis  5 MG Tabs tablet Generic drug: apixaban  Take 1 tablet (5 mg total) by mouth 2 (two) times daily.   Fenofibric Acid  45 MG Cpdr Take 1 tablet by mouth daily.   fluticasone  50 MCG/ACT nasal spray Commonly known as: FLONASE  Place 2 sprays into both nostrils daily.   furosemide  80 MG tablet Commonly known as: LASIX  Take 1 tablet (80 mg total) by mouth daily.   Insulin  Lispro Prot & Lispro (75-25) 100 UNIT/ML Kwikpen Commonly known as: HumaLOG  Mix 75/25 KwikPen Inject 18 Units into the skin in the morning and at bedtime.   metFORMIN  500 MG tablet Commonly known as: GLUCOPHAGE  Take 1 tablet (500 mg total) by mouth 2 (two) times daily with a meal. What changed:  medication strength how much to take   metoprolol  succinate 100 MG 24 hr tablet Commonly known as: TOPROL -XL Take 1 tablet (100 mg total) by mouth daily.   OVER THE COUNTER MEDICATION Take 1 tablet by mouth in the morning and at bedtime. [facility] - Restore multivitamin   spironolactone  25 MG tablet Commonly known as: ALDACTONE  TAKE 1 TABLET BY MOUTH DAILY   tamsulosin  0.4 MG Caps capsule Commonly known as: FLOMAX  Take 1 capsule (0.4 mg total) by mouth daily. What changed: when to take this         Procedures/Studies:   DG CHEST PORT 1 VIEW Result Date: 06/05/2024 EXAM: 1 VIEW(S) XRAY OF THE CHEST 06/05/2024 02:55:00 PM COMPARISON: 06/02/2024 CLINICAL HISTORY: Dyspnea FINDINGS: LINES, TUBES AND DEVICES: Left chest AICD in place. LUNGS AND PLEURA: Low lung volumes. Diffuse interstitial opacities and patchy bibasilar  opacities, similar to prior. Similar small right pleural effusion. No pneumothorax. HEART AND MEDIASTINUM: Stable cardiomegaly. Aortic atherosclerosis. Redemonstrated cardiac valve replacement. BONES AND SOFT TISSUES: No acute osseous abnormality. IMPRESSION: 1. Low lung volumes with diffuse interstitial opacities and patchy bibasilar opacities, unchanged, likely mild pulmonary edema. 2. Unchanged  small right pleural effusion. 3. Stable cardiomegaly with aortic atherosclerosis, cardiac valve replacement, and left chest AICD in place. Electronically signed by: Franky Stanford MD 06/05/2024 10:05 PM EST RP Workstation: [id number]   CARDIAC CATHETERIZATION Result Date: 06/05/2024 Mild pulmonary hypertension with mean PAP of 31 mmHg and PCWP of 23 mmHg-V wave of 27 mmHg. PVR 2.59 By Fick and 3.18 by Thermal Ao sat 96%, PA sat 55%: Fick cardiac output 5.01, 2.23; Thermodilution 4.09-1.82. RECOMMENDATIONS Continue management per primary cardiology team.  May still need additional diuresis but close to euvolemic. Patient had notable trace hematuria when urinating in the Cath Lab.  This dissipated seen in the floor as well. Restart IV heparin  2 hours after sheath removal. Alm MICAEL Clay, MD, MS Alm Clay, M.D., M.S. Interventional Cardiologist [facility] Pager # [phone]  DG C-Arm 1-60 Min-No Report Result Date: 06/03/2024 Fluoroscopy was utilized by the requesting physician.  No radiographic interpretation.   DG Chest 2 View Result Date: 06/02/2024 CLINICAL DATA:  Rales EXAM: CHEST - 2 VIEW COMPARISON:  Chest radiograph dated 05/31/2024 FINDINGS: Lines/tubes: Left chest wall ICD lead projects over the right ventricle. Lungs: Low lung volumes with bronchovascular crowding. Increased diffuse interstitial opacity. Pleura: Blunting of bilateral costophrenic angles.  No pneumothorax. Heart/mediastinum: Similar enlarged cardiomediastinal silhouette. Bones: No acute osseous abnormality. IMPRESSION: 1.  Increased diffuse interstitial opacity, which may represent pulmonary edema. 2. Blunting of bilateral costophrenic angles, which may represent small pleural effusions. Electronically Signed   By: Limin  Xu M.D.   On: 06/02/2024 16:15   DG Chest 2 View Result Date: 05/31/2024 EXAM: 2 VIEW(S) XRAY OF THE CHEST 05/30/2024 03:30:37 PM COMPARISON: 08/10/2023 CLINICAL HISTORY: cough 2-3 weeks FINDINGS: LINES, TUBES AND DEVICES: Cardiac pacemaker. LUNGS AND PLEURA: Shallow inspiration. Perihilar infiltration, likely edema. Small bilateral pleural effusions. Appearances are similar to the previous study, although effusions are smaller. No pneumothorax. HEART AND MEDIASTINUM: Cardiac enlargement. BONES AND SOFT TISSUES: No acute osseous abnormality. IMPRESSION: 1. Cardiomegaly with perihilar opacities most consistent with edema, with small bilateral pleural effusions, overall similar to the prior study though the effusions are smaller. Electronically signed by: Elsie Gravely MD 05/31/2024 10:44 PM EST RP Workstation: [id number]   CT ABDOMEN PELVIS WO CONTRAST Result Date: 05/31/2024 EXAM: CT ABDOMEN AND PELVIS WITHOUT CONTRAST 05/31/2024 10:34:00 PM TECHNIQUE: CT of the abdomen and pelvis was performed without the administration of intravenous contrast. Multiplanar reformatted images are provided for review. Automated exposure control, iterative reconstruction, and/or weight-based adjustment of the mA/kV was utilized to reduce the radiation dose to as low as reasonably achievable. COMPARISON: Abdominal radiograph 05/05/2017 and CT abdomen and pelvis 01/04/2006. CLINICAL HISTORY: Abdominal pain, acute, nonlocalized; P.o. intolerance. FINDINGS: LOWER CHEST: Small bilateral pleural effusions, greater on the right. Diffuse interstitial reticular nodular and alveolar infiltrates in the lung bases. This probably represents edema, possibly with underlying chronic interstitial lung disease. Pneumonia or aspiration could  also have this pattern. Cardiac enlargement. LIVER: The liver is unremarkable. GALLBLADDER AND BILE DUCTS: Gallbladder is unremarkable. No biliary ductal dilatation. SPLEEN: No acute abnormality. PANCREAS: No acute abnormality. ADRENAL GLANDS: No acute abnormality. KIDNEYS, URETERS AND BLADDER: Right kidney: 9 mm stone in the mid right ureter at the level of L4 with prominent hydronephrosis and hydroureter and stranding around the right kidney. Distal right ureter is decompressed. Left kidney: 3.3 cm cyst on the left kidney. No imaging follow-up is indicated. Per consensus, no follow-up is needed for simple Bosniak type 1 and 2 renal cysts, unless the patient has  a malignancy history or risk factors. Left kidney and left ureter are otherwise unremarkable. Urinary bladder is unremarkable. GI AND BOWEL: The stomach, small bowel, and colon are not abnormally distended. No wall thickening or inflammatory stranding is appreciated. The appendix is normal. There is no bowel obstruction. PERITONEUM AND RETROPERITONEUM: No ascites. No free air. VASCULATURE: Calcification of the aorta. Aorta is normal in caliber. No aneurysm. LYMPH NODES: No lymphadenopathy. REPRODUCTIVE ORGANS: The prostate gland is mildly enlarged. BONES AND SOFT TISSUES: Degenerative changes in the spine. No acute osseous abnormality. No focal soft tissue abnormality. IMPRESSION: 1. 9 mm stone in the mid right ureter at the level of L4 with prominent hydronephrosis, hydroureter, and perinephric stranding. Distal ureter is decompressed. 2. Small bilateral pleural effusions, greater on the right, with diffuse interstitial, reticulonodular, and alveolar infiltrates in the lung bases, which may reflect edema, chronic interstitial lung disease, pneumonia, or aspiration. 3. Cardiac enlargement. Electronically signed by: Elsie Gravely MD 05/31/2024 10:43 PM EST RP Workstation: [id number]   DG Chest 2 View Result Date: 05/31/2024 EXAM: 2 VIEW(S) XRAY OF THE  CHEST 05/31/2024 08:17:00 PM COMPARISON: 05/30/2024 CLINICAL HISTORY: bradycardia bradycardia FINDINGS: LINES, TUBES AND DEVICES: Left AICD remains in place, unchanged. LUNGS AND PLEURA: Worsening interstitial opacities throughout the lungs, likely interstitial edema. No pleural effusion. No pneumothorax. HEART AND MEDIASTINUM: Cardiomegaly. Left AICD remains in place, unchanged. BONES AND SOFT TISSUES: No acute osseous abnormality. IMPRESSION: 1. Worsening interstitial opacities throughout the lungs, likely interstitial edema. 2. Cardiomegaly. Electronically signed by: Franky Crease MD 05/31/2024 08:21 PM EST RP Workstation: [id number]   ECHOCARDIOGRAM COMPLETE Result Date: 05/29/2024    ECHOCARDIOGRAM REPORT   Patient Name:   Christopher [name] Date of Exam: 05/29/2024 Medical Rec #:  [id number]      Height:       70.0 in Accession #:    [id number]     Weight:       229.4 lb Date of Birth:  1951/06/10      BSA:          2.213 m Patient Age:    73 years       BP:           108/74 mmHg Patient Gender: M              HR:           99 bpm. Exam Location:  [address] Procedure: 2D Echo, 3D Echo, Cardiac Doppler and Color Doppler (Both Spectral            and Color Flow Doppler were utilized during procedure). 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 CHF and Cardiomyopathy, CAD and Previous                 Myocardial Infarction, Abnormal ECG, Defibrillator and                 Pacemaker, Mitral Valve Disease, Arrythmias:Atrial Fibrillation                 and Ventricular Fibrillation, Signs/Symptoms:Murmur, Edema,                 Dyspnea, Shortness of Breath and Fatigue; Risk                 Factors:Hypertension, Diabetes, Dyslipidemia,  Former Smoker,                 Family History of Coronary Artery Disease and Sleep Apnea.                 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  Sonographer:    Heather Hawks RDCS Referring Phys: VINIE BROCKS HILTY IMPRESSIONS  1. Left ventricular ejection fraction, by estimation, is 30 to 35%. The left ventricle has moderately decreased function. The left ventricle demonstrates global hypokinesis. The left ventricular internal cavity size was moderately dilated. Left ventricular diastolic function could not be evaluated. Elevated left atrial pressure. The E/e' is 27.  2. Right ventricular systolic function is mildly reduced. The right ventricular size is normal. Mildly increased right ventricular wall thickness. There is severely elevated pulmonary artery systolic pressure. The estimated right ventricular systolic pressure is 68.0 mmHg.  3. Left atrial size was mildly dilated.  4. Right atrial size was mildly dilated.  5. S/P 30mm Sorin Memo 3D Annuloplasty Ring, implanted 09/27/2012, no mitral stenosis (MG at HR 89bpm), mild mitral regurgitation.  6. The aortic valve is tricuspid. Aortic valve regurgitation is mild. Aortic valve sclerosis is present, with no evidence of aortic valve stenosis.  7. The inferior vena cava is normal in size with greater than 50% respiratory variability, suggesting right atrial pressure of 3 mmHg. Comparison(s): A prior study was performed on 08/12/2023. LVEF 30-35%, s/p mitral annuloplasty (MG 3 mmHG at HR 98bpm). FINDINGS  Left Ventricle: Left ventricular ejection fraction, by estimation, is 30 to 35%. The left ventricle has moderately decreased function. The left ventricle demonstrates global hypokinesis. 3D ejection fraction reviewed and evaluated as part of the interpretation. Alternate measurement of EF is felt to be most reflective of LV function. The left ventricular internal cavity size was moderately dilated. There is no left ventricular hypertrophy. Left ventricular  diastolic function could not be evaluated due to mitral valve repair. Left ventricular diastolic function could not be evaluated. Elevated left atrial pressure. The E/e' is 85. Right Ventricle: The right ventricular size is normal. Mildly increased right ventricular wall thickness. Right ventricular systolic function is mildly reduced. There is severely elevated pulmonary artery systolic pressure. The tricuspid regurgitant velocity is 4.03 m/s, and with an assumed right atrial pressure of 3 mmHg, the estimated right ventricular systolic pressure is 68.0 mmHg. Left Atrium: Left atrial size was mildly dilated. Right Atrium: Right atrial size was mildly dilated. Pericardium: There is no evidence of pericardial effusion. Mitral Valve: S/P 30mm Sorin Memo 3D Annuloplasty Ring, implanted 09/27/2012, no mitral stenosis (MG at HR 89bpm), mild mitral regurgitation. MV peak gradient, 10.8 mmHg. The mean mitral valve gradient is 2.0 mmHg. Tricuspid Valve: The tricuspid valve is grossly normal. Tricuspid valve regurgitation is mild . No evidence of tricuspid stenosis. Aortic Valve: The aortic valve is tricuspid. Aortic valve regurgitation is mild. Aortic regurgitation PHT measures 1142 msec. Aortic valve sclerosis is present, with no evidence of aortic valve stenosis. Pulmonic Valve: The pulmonic valve was not well visualized. Pulmonic valve regurgitation is mild to moderate. No evidence of pulmonic stenosis. Aorta: The aortic root and ascending aorta are structurally normal, with no evidence of dilitation. Venous: The  inferior vena cava is normal in size with greater than 50% respiratory variability, suggesting right atrial pressure of 3 mmHg. IAS/Shunts: The atrial septum is grossly normal. Additional Comments: 3D was performed not requiring image post processing on an independent workstation and was indeterminate. A device lead is visualized.  LEFT VENTRICLE PLAX 2D LVIDd:         6.10 cm   Diastology LVIDs:          5.20 cm   LV e' medial:    3.97 cm/s LV PW:         0.90 cm   LV E/e' medial:  32.9 LV IVS:        0.80 cm   LV e' lateral:   6.09 cm/s LVOT diam:     2.60 cm   LV E/e' lateral: 21.4 LV SV:         53 LV SV Index:   24 LVOT Area:     5.31 cm                           3D Volume EF:                          3D EF:        39 %                          LV EDV:       222 ml                          LV ESV:       135 ml                          LV SV:        86 ml RIGHT VENTRICLE RV Basal diam:  4.40 cm    PULMONARY VEINS RV Mid diam:    3.40 cm    A Reversal Velocity: 24.20 cm/s RV S prime:     9.72 cm/s  Diastolic Velocity:  37.55 cm/s TAPSE (M-mode): 1.4 cm     S/D Velocity:        0.50 RVSP:           68.0 mmHg  Systolic Velocity:   19.05 cm/s LEFT ATRIUM             Index        RIGHT ATRIUM           Index LA diam:        5.00 cm 2.26 cm/m   RA Pressure: 3.00 mmHg LA Vol (A2C):   54.8 ml 24.77 ml/m  RA Area:     24.70 cm LA Vol (A4C):   86.6 ml 39.14 ml/m  RA Volume:   81.20 ml  36.70 ml/m LA Biplane Vol: 74.3 ml 33.58 ml/m  AORTIC VALVE LVOT Vmax:   60.36 cm/s LVOT Vmean:  38.360 cm/s LVOT VTI:    0.099 m AI PHT:      1142 msec  AORTA Ao Root diam: 3.50 cm Ao Asc diam:  3.00 cm MITRAL VALVE                  TRICUSPID VALVE MV Area (PHT): cm            TR Peak  grad:   65.0 mmHg MV Area VTI:   1.32 cm       TR Vmax:        403.00 cm/s MV Peak grad:  10.8 mmHg      Estimated RAP:  3.00 mmHg MV Mean grad:  2.0 mmHg       RVSP:           68.0 mmHg MV Vmax:       1.64 m/s MV Vmean:      61.3 cm/s      SHUNTS MV Decel Time: 282 msec       Systemic VTI:  0.10 m MR Peak grad:    75.0 mmHg    Systemic Diam: 2.60 cm MR Mean grad:    41.0 mmHg MR Vmax:         433.00 cm/s MR Vmean:        301.0 cm/s MR PISA:         2.26 cm MR PISA Eff ROA: 17 mm MR PISA Radius:  0.60 cm MV E velocity: 130.50 cm/s Sunit Tolia Electronically signed by Madonna Large Signature Date/Time: 05/29/2024/9:44:30 PM    Final    CUP PACEART  REMOTE DEVICE CHECK Result Date: 05/16/2024 ICD Scheduled remote reviewed. Normal device function.  Presenting rhythm: VS, PVCs.  9 AHR detections, longest 40 minutes, EGMs appear false due to ectopy. 1 device classified NSVT, EGM shows 22 beat WCT. Next remote transmission per protocol - CS, CVRS      The results of significant diagnostics from this hospitalization (including imaging, microbiology, ancillary and laboratory) are listed below for reference.     Microbiology: Recent Results (from the past 240 hours)  Resp panel by RT-PCR (RSV, Flu A&B, Covid) Anterior Nasal Swab     Status: None   Collection Time: 05/31/24 11:43 PM   Specimen: Anterior Nasal Swab  Result Value Ref Range Status   SARS Coronavirus 2 by RT PCR NEGATIVE NEGATIVE Final   Influenza A by PCR NEGATIVE NEGATIVE Final   Influenza B by PCR NEGATIVE NEGATIVE Final    Comment: (NOTE) The Xpert Xpress SARS-CoV-2/FLU/RSV plus assay is intended as an aid in the diagnosis of influenza from Nasopharyngeal swab specimens and should not be used as a sole basis for treatment. Nasal washings and aspirates are unacceptable for Xpert Xpress SARS-CoV-2/FLU/RSV testing.  Fact Sheet for Patients: bloggercourse.com  Fact Sheet for Healthcare Providers: seriousbroker.it  This test is not yet approved or cleared by the [place]  FDA and has been authorized for detection and/or diagnosis of SARS-CoV-2 by FDA under an Emergency Use Authorization (EUA). This EUA will remain in effect (meaning this test can be used) for the duration of the COVID-19 declaration under Section 564(b)(1) of the Act, 21 U.S.C. section 360bbb-3(b)(1), unless the authorization is terminated or revoked.     Resp Syncytial Virus by PCR NEGATIVE NEGATIVE Final    Comment: (NOTE) Fact Sheet for Patients: bloggercourse.com  Fact Sheet for Healthcare  Providers: seriousbroker.it  This test is not yet approved or cleared by the [place]  FDA and has been authorized for detection and/or diagnosis of SARS-CoV-2 by FDA under an Emergency Use Authorization (EUA). This EUA will remain in effect (meaning this test can be used) for the duration of the COVID-19 declaration under Section 564(b)(1) of the Act, 21 U.S.C. section 360bbb-3(b)(1), unless the authorization is terminated or revoked.  Performed at [facility], [address] [place], [name] [address]   Culture,  blood (routine x 2) Call MD if unable to obtain prior to antibiotics being given     Status: None   Collection Time: 06/01/24  4:08 AM   Specimen: BLOOD RIGHT ARM  Result Value Ref Range Status   Specimen Description BLOOD RIGHT ARM  Final   Special Requests   Final    BOTTLES DRAWN AEROBIC AND ANAEROBIC Blood Culture adequate volume   Culture   Final    NO GROWTH 5 DAYS Performed at [facility], [address] [place], [name] [address]    Report Status 06/06/2024 FINAL  Final  Culture, blood (routine x 2) Call MD if unable to obtain prior to antibiotics being given     Status: None   Collection Time: 06/01/24  4:16 AM   Specimen: BLOOD RIGHT HAND  Result Value Ref Range Status   Specimen Description BLOOD RIGHT HAND  Final   Special Requests   Final    BOTTLES DRAWN AEROBIC AND ANAEROBIC Blood Culture results may not be optimal due to an inadequate volume of blood received in culture bottles   Culture   Final    NO GROWTH 5 DAYS Performed at [facility], [address] [place], [name] [address]    Report Status 06/06/2024 FINAL  Final  Surgical pcr screen     Status: None   Collection Time: 06/03/24  1:25 AM   Specimen: Nasal Mucosa; Nasal Swab  Result Value Ref Range Status   MRSA, PCR NEGATIVE NEGATIVE Final   Staphylococcus aureus NEGATIVE NEGATIVE Final    Comment: (NOTE) The Xpert SA Assay (FDA  approved for NASAL specimens in patients 43 years of age and older), is one component of a comprehensive surveillance program. It is not intended to diagnose infection nor to guide or monitor treatment. Performed at [facility], [address] [place], [name] [address]      Labs:  CBC: Recent Labs  Lab 06/04/24 0357 06/05/24 0410 06/05/24 1015 06/05/24 1016 06/06/24 0415 06/07/24 0548 06/08/24 0429  WBC 7.6 7.8  --   --  7.3 6.6 7.5  HGB 10.0* 10.7* 10.2* 9.9* 11.1* 10.4* 10.3*  HCT 30.2* 32.7* 30.0* 29.0* 33.3* 31.4* 31.5*  MCV 90.4 90.8  --   --  90.2 90.2 90.3  PLT 206 207  --   --  203 200 204   BMP &GFR Recent Labs  Lab 06/04/24 0357 06/05/24 0410 06/05/24 1015 06/05/24 1016 06/06/24 0415 06/07/24 0548 06/08/24 0429  NA 136 138 141 143 139 137 136  K 3.7 3.7 3.8 3.6 4.6 4.2 3.8  CL 106 105  --   --  104 104 104  CO2 21* 26  --   --  28 27 25   GLUCOSE 126* 115*  --   --  115* 101* 164*  BUN 31* 29*  --   --  33* 29* 30*  CREATININE 2.73* 2.67*  --   --  2.70* 2.51* 2.72*  CALCIUM  8.3* 8.2*  --   --  8.4* 8.1* 8.2*  MG  --  2.1  --   --   --   --   --    Estimated Creatinine Clearance: 28.2 mL/min (A) (by C-G formula based on SCr of 2.72 mg/dL (H)). Liver & Pancreas: Recent Labs  Lab 06/04/24 0357 06/05/24 0410 06/06/24 0415 06/07/24 0548 06/08/24 0429  AST 18 23 20 19 22   ALT 12 13 12 13 16   ALKPHOS 26*  30* 31* 28* 33*  BILITOT 0.7 1.1 1.0 0.9 0.9  PROT 5.6* 5.9* 6.3* 5.8* 5.9*  ALBUMIN  2.9* 3.0* 3.2* 2.9* 2.9*   No results for input(s): LIPASE, AMYLASE in the last 168 hours. No results for input(s): AMMONIA in the last 168 hours. Diabetic: No results for input(s): HGBA1C in the last 72 hours. Recent Labs  Lab 06/07/24 0729 06/07/24 1113 06/07/24 1644 06/07/24 2149 06/08/24 0739  GLUCAP 100* 96 127* 152* 126*   Cardiac Enzymes: No results for input(s): CKTOTAL, CKMB, CKMBINDEX, TROPONINI in the last 168  hours. No results for input(s): PROBNP in the last 8760 hours. Coagulation Profile: No results for input(s): INR, PROTIME in the last 168 hours. Thyroid  Function Tests: No results for input(s): TSH, T4TOTAL, FREET4, T3FREE, THYROIDAB in the last 72 hours. Lipid Profile: No results for input(s): CHOL, HDL, LDLCALC, TRIG, CHOLHDL, LDLDIRECT in the last 72 hours. Anemia Panel: No results for input(s): VITAMINB12, FOLATE, FERRITIN, TIBC, IRON, RETICCTPCT in the last 72 hours. Urine analysis:    Component Value Date/Time   COLORURINE YELLOW 05/31/2024 2203   APPEARANCEUR CLEAR 05/31/2024 2203   LABSPEC 1.007 05/31/2024 2203   PHURINE 6.0 05/31/2024 2203   GLUCOSEU >=500 (A) 05/31/2024 2203   HGBUR NEGATIVE 05/31/2024 2203   BILIRUBINUR NEGATIVE 05/31/2024 2203   BILIRUBINUR small 05/03/2013 0845   KETONESUR NEGATIVE 05/31/2024 2203   PROTEINUR NEGATIVE 05/31/2024 2203   UROBILINOGEN 0.2 05/03/2013 0845   UROBILINOGEN 1.0 09/23/2012 1515   NITRITE NEGATIVE 05/31/2024 2203   LEUKOCYTESUR NEGATIVE 05/31/2024 2203   Sepsis Labs: Invalid input(s): PROCALCITONIN, LACTICIDVEN   SIGNED:  Mignon ONEIDA Bump, MD  [facility] 06/08/2024, 12:11 PM

## 2024-06-08 NOTE — TOC Transition Note (Signed)
 Transition of Care [facility]) - Discharge Note   Patient Details  Name: Christopher [name] MRN: [id number] Date of Birth: 08-24-1950  [facility] CM/SW Contact:  Tom-Johnson, Harvest Muskrat, RN Phone Number: 06/08/2024, 9:43 AM   Clinical Narrative:     Patient is scheduled for discharge today.  Readmission Risk Assessment done. Outpatient PT referral, hospital f/u and discharge instructions on AVS. Prescriptions sent to [facility] pharmacy and patient will receive meds prior discharge. Cab voucher will be given to patient at the D/C lounge to transport at discharge.  No further ICM needs noted.        Final next level of care: OP Rehab Barriers to Discharge: Barriers Resolved   Patient Goals and CMS Choice Patient states their goals for this hospitalization and ongoing recovery are:: To return home CMS Medicare.gov Compare Post Acute Care list provided to:: Patient Choice offered to / list presented to : Patient      Discharge Placement                Patient to be transferred to facility by: [facility]      Discharge Plan and Services Additional resources added to the After Visit Summary for                  DME Arranged: N/A DME Agency: NA       HH Arranged: NA HH Agency: NA        Social Drivers of Health (SDOH) Interventions SDOH Screenings   Food Insecurity: No Food Insecurity (05/26/2024)  Housing: Unknown (05/26/2024)  Transportation Needs: No Transportation Needs (05/26/2024)  Utilities: Not At Risk (11/23/2023)  Alcohol Screen: Low Risk  (05/26/2024)  Depression (PHQ2-9): Low Risk  (11/23/2023)  Financial Resource Strain: Low Risk  (05/26/2024)  Physical Activity: Inactive (05/26/2024)  Social Connections: Moderately Isolated (05/26/2024)  Stress: No Stress Concern Present (05/26/2024)  Tobacco Use: Medium Risk (06/03/2024)  Health Literacy: Adequate Health Literacy (11/23/2023)     Readmission Risk Interventions    06/02/2024   10:33 AM   Readmission Risk Prevention Plan  Transportation Screening Complete  PCP or Specialist Appt within 5-7 Days Complete  Home Care Screening Complete  Medication Review (RN CM) Referral to Pharmacy

## 2024-06-08 NOTE — Plan of Care (Signed)
 Problem: Education: Goal: Individualized Educational Video(s) 06/08/2024 1017 by Franchot Rosaria LABOR, RN Outcome: Adequate for Discharge 06/08/2024 1017 by Franchot Rosaria LABOR, RN Outcome: Adequate for Discharge   Problem: Coping: Goal: Ability to adjust to condition or change in health will improve 06/08/2024 1017 by Franchot Rosaria LABOR, RN Outcome: Adequate for Discharge 06/08/2024 1017 by Franchot Rosaria LABOR, RN Outcome: Adequate for Discharge   Problem: Fluid Volume: Goal: Ability to maintain a balanced intake and output will improve 06/08/2024 1017 by Franchot Rosaria LABOR, RN Outcome: Adequate for Discharge 06/08/2024 1017 by Franchot Rosaria LABOR, RN Outcome: Adequate for Discharge   Problem: Health Behavior/Discharge Planning: Goal: Ability to identify and utilize available resources and services will improve 06/08/2024 1017 by Franchot Rosaria LABOR, RN Outcome: Adequate for Discharge 06/08/2024 1017 by Franchot Rosaria LABOR, RN Outcome: Adequate for Discharge Goal: Ability to manage health-related needs will improve 06/08/2024 1017 by Franchot Rosaria LABOR, RN Outcome: Adequate for Discharge 06/08/2024 1017 by Franchot Rosaria LABOR, RN Outcome: Adequate for Discharge   Problem: Metabolic: Goal: Ability to maintain appropriate glucose levels will improve 06/08/2024 1017 by Franchot Rosaria LABOR, RN Outcome: Adequate for Discharge 06/08/2024 1017 by Franchot Rosaria LABOR, RN Outcome: Adequate for Discharge   Problem: Nutritional: Goal: Maintenance of adequate nutrition will improve 06/08/2024 1017 by Franchot Rosaria LABOR, RN Outcome: Adequate for Discharge 06/08/2024 1017 by Franchot Rosaria LABOR, RN Outcome: Adequate for Discharge Goal: Progress toward achieving an optimal weight will improve 06/08/2024 1017 by Franchot Rosaria LABOR, RN Outcome: Adequate for Discharge 06/08/2024 1017 by Franchot Rosaria LABOR, RN Outcome: Adequate for Discharge   Problem: Skin Integrity: Goal: Risk for  impaired skin integrity will decrease 06/08/2024 1017 by Franchot Rosaria LABOR, RN Outcome: Adequate for Discharge 06/08/2024 1017 by Franchot Rosaria LABOR, RN Outcome: Adequate for Discharge   Problem: Tissue Perfusion: Goal: Adequacy of tissue perfusion will improve 06/08/2024 1017 by Franchot Rosaria LABOR, RN Outcome: Adequate for Discharge 06/08/2024 1017 by Franchot Rosaria LABOR, RN Outcome: Adequate for Discharge   Problem: Activity: Goal: Ability to tolerate increased activity will improve 06/08/2024 1017 by Franchot Rosaria LABOR, RN Outcome: Adequate for Discharge 06/08/2024 1017 by Franchot Rosaria LABOR, RN Outcome: Adequate for Discharge   Problem: Clinical Measurements: Goal: Ability to maintain a body temperature in the normal range will improve 06/08/2024 1017 by Franchot Rosaria LABOR, RN Outcome: Adequate for Discharge 06/08/2024 1017 by Franchot Rosaria LABOR, RN Outcome: Adequate for Discharge   Problem: Respiratory: Goal: Ability to maintain adequate ventilation will improve 06/08/2024 1017 by Franchot Rosaria LABOR, RN Outcome: Adequate for Discharge 06/08/2024 1017 by Franchot Rosaria LABOR, RN Outcome: Adequate for Discharge Goal: Ability to maintain a clear airway will improve 06/08/2024 1017 by Franchot Rosaria LABOR, RN Outcome: Adequate for Discharge 06/08/2024 1017 by Franchot Rosaria LABOR, RN Outcome: Adequate for Discharge   Problem: Education: Goal: Knowledge of General Education information will improve Description: Including pain rating scale, medication(s)/side effects and non-pharmacologic comfort measures 06/08/2024 1017 by Franchot Rosaria LABOR, RN Outcome: Adequate for Discharge 06/08/2024 1017 by Franchot Rosaria LABOR, RN Outcome: Adequate for Discharge   Problem: Health Behavior/Discharge Planning: Goal: Ability to manage health-related needs will improve 06/08/2024 1017 by Franchot Rosaria LABOR, RN Outcome: Adequate for Discharge 06/08/2024 1017 by Franchot Rosaria LABOR,  RN Outcome: Adequate for Discharge   Problem: Clinical Measurements: Goal: Ability to maintain clinical measurements within normal limits will improve 06/08/2024 1017 by Franchot Rosaria LABOR, RN Outcome: Adequate for Discharge 06/08/2024 1017 by Franchot Rosaria LABOR, RN Outcome: Adequate  for Discharge Goal: Will remain free from infection 06/08/2024 1017 by Franchot Rosaria LABOR, RN Outcome: Adequate for Discharge 06/08/2024 1017 by Franchot Rosaria LABOR, RN Outcome: Adequate for Discharge Goal: Diagnostic test results will improve 06/08/2024 1017 by Franchot Rosaria LABOR, RN Outcome: Adequate for Discharge 06/08/2024 1017 by Franchot Rosaria LABOR, RN Outcome: Adequate for Discharge Goal: Respiratory complications will improve 06/08/2024 1017 by Franchot Rosaria LABOR, RN Outcome: Adequate for Discharge 06/08/2024 1017 by Franchot Rosaria LABOR, RN Outcome: Adequate for Discharge Goal: Cardiovascular complication will be avoided 06/08/2024 1017 by Franchot Rosaria LABOR, RN Outcome: Adequate for Discharge 06/08/2024 1017 by Franchot Rosaria LABOR, RN Outcome: Adequate for Discharge   Problem: Activity: Goal: Risk for activity intolerance will decrease 06/08/2024 1017 by Franchot Rosaria LABOR, RN Outcome: Adequate for Discharge 06/08/2024 1017 by Franchot Rosaria LABOR, RN Outcome: Adequate for Discharge   Problem: Nutrition: Goal: Adequate nutrition will be maintained 06/08/2024 1017 by Franchot Rosaria LABOR, RN Outcome: Adequate for Discharge 06/08/2024 1017 by Franchot Rosaria LABOR, RN Outcome: Adequate for Discharge   Problem: Coping: Goal: Level of anxiety will decrease 06/08/2024 1017 by Franchot Rosaria LABOR, RN Outcome: Adequate for Discharge 06/08/2024 1017 by Franchot Rosaria LABOR, RN Outcome: Adequate for Discharge   Problem: Elimination: Goal: Will not experience complications related to bowel motility 06/08/2024 1017 by Franchot Rosaria LABOR, RN Outcome: Adequate for Discharge 06/08/2024 1017 by Franchot Rosaria LABOR, RN Outcome: Adequate for Discharge Goal: Will not experience complications related to urinary retention 06/08/2024 1017 by Franchot Rosaria LABOR, RN Outcome: Adequate for Discharge 06/08/2024 1017 by Franchot Rosaria LABOR, RN Outcome: Adequate for Discharge   Problem: Pain Managment: Goal: General experience of comfort will improve and/or be controlled 06/08/2024 1017 by Franchot Rosaria LABOR, RN Outcome: Adequate for Discharge 06/08/2024 1017 by Franchot Rosaria LABOR, RN Outcome: Adequate for Discharge   Problem: Safety: Goal: Ability to remain free from injury will improve 06/08/2024 1017 by Franchot Rosaria LABOR, RN Outcome: Adequate for Discharge 06/08/2024 1017 by Franchot Rosaria LABOR, RN Outcome: Adequate for Discharge   Problem: Skin Integrity: Goal: Risk for impaired skin integrity will decrease 06/08/2024 1017 by Franchot Rosaria LABOR, RN Outcome: Adequate for Discharge 06/08/2024 1017 by Franchot Rosaria LABOR, RN Outcome: Adequate for Discharge   Problem: Education: Goal: Understanding of CV disease, CV risk reduction, and recovery process will improve 06/08/2024 1017 by Franchot Rosaria LABOR, RN Outcome: Adequate for Discharge 06/08/2024 1017 by Franchot Rosaria LABOR, RN Outcome: Adequate for Discharge Goal: Individualized Educational Video(s) 06/08/2024 1017 by Franchot Rosaria LABOR, RN Outcome: Adequate for Discharge 06/08/2024 1017 by Franchot Rosaria LABOR, RN Outcome: Adequate for Discharge   Problem: Activity: Goal: Ability to return to baseline activity level will improve 06/08/2024 1017 by Franchot Rosaria LABOR, RN Outcome: Adequate for Discharge 06/08/2024 1017 by Franchot Rosaria LABOR, RN Outcome: Adequate for Discharge   Problem: Cardiovascular: Goal: Ability to achieve and maintain adequate cardiovascular perfusion will improve 06/08/2024 1017 by Franchot Rosaria LABOR, RN Outcome: Adequate for Discharge 06/08/2024 1017 by Franchot Rosaria LABOR, RN Outcome: Adequate for  Discharge Goal: Vascular access site(s) Level 0-1 will be maintained 06/08/2024 1017 by Franchot Rosaria LABOR, RN Outcome: Adequate for Discharge 06/08/2024 1017 by Franchot Rosaria LABOR, RN Outcome: Adequate for Discharge   Problem: Health Behavior/Discharge Planning: Goal: Ability to safely manage health-related needs after discharge will improve 06/08/2024 1017 by Franchot Rosaria LABOR, RN Outcome: Adequate for Discharge 06/08/2024 1017 by Franchot Rosaria LABOR, RN Outcome: Adequate for Discharge   Problem: Education: Goal: Understanding of  CV disease, CV risk reduction, and recovery process will improve 06/08/2024 1017 by Franchot Rosaria LABOR, RN Outcome: Adequate for Discharge 06/08/2024 1017 by Franchot Rosaria LABOR, RN Outcome: Adequate for Discharge Goal: Individualized Educational Video(s) 06/08/2024 1017 by Franchot Rosaria LABOR, RN Outcome: Adequate for Discharge 06/08/2024 1017 by Franchot Rosaria LABOR, RN Outcome: Adequate for Discharge   Problem: Activity: Goal: Ability to return to baseline activity level will improve 06/08/2024 1017 by Franchot Rosaria LABOR, RN Outcome: Adequate for Discharge 06/08/2024 1017 by Franchot Rosaria LABOR, RN Outcome: Adequate for Discharge   Problem: Cardiovascular: Goal: Ability to achieve and maintain adequate cardiovascular perfusion will improve 06/08/2024 1017 by Franchot Rosaria LABOR, RN Outcome: Adequate for Discharge 06/08/2024 1017 by Franchot Rosaria LABOR, RN Outcome: Adequate for Discharge Goal: Vascular access site(s) Level 0-1 will be maintained 06/08/2024 1017 by Franchot Rosaria LABOR, RN Outcome: Adequate for Discharge 06/08/2024 1017 by Franchot Rosaria LABOR, RN Outcome: Adequate for Discharge   Problem: Health Behavior/Discharge Planning: Goal: Ability to safely manage health-related needs after discharge will improve 06/08/2024 1017 by Franchot Rosaria LABOR, RN Outcome: Adequate for Discharge 06/08/2024 1017 by Franchot Rosaria LABOR, RN Outcome:  Adequate for Discharge

## 2024-06-08 NOTE — Progress Notes (Signed)
 DAILY PROGRESS NOTE   Patient Name: Christopher [name] Date of Encounter: 06/08/2024 Cardiologist: Vinie JAYSON Maxcy, MD  Chief Complaint   Cough improved   Patient Profile   Christopher [name] is a 73 y.o. 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 Jonel   Subjective   Good additional diuresis overnight- BP normal to low normal, but off of Entresto . Creatinine 2.72 today (Ranges from 2.5-2.7).  Objective   Vitals:   06/07/24 2100 06/07/24 2140 06/07/24 2151 06/08/24 0854  BP: 92/76 92/76 106/82 119/80  Pulse: 76 76  79  Resp: 16   18  Temp: 98.4 F (36.9 C) 98.4 F (36.9 C)  98.3 F (36.8 C)  TempSrc: Oral Oral  Oral  SpO2: 98%   98%  Weight:      Height:        Intake/Output Summary (Last 24 hours) at 06/08/2024 0908 Last data filed at 06/07/2024 1653 Gross per 24 hour  Intake --  Output 900 ml  Net -900 ml   Filed Weights   06/01/24 0027 06/03/24 0735 06/07/24 0508  Weight: 99.8 kg 108.4 kg 99.8 kg    Physical Exam   General appearance: alert and no distress Lungs: diminished breath sounds RLL and rales bibasilar Heart: regular rate and rhythm Extremities: extremities normal, atraumatic, no cyanosis or edema Neurologic: Grossly normal  Inpatient Medications    Scheduled Meds:  amiodarone   200 mg Oral BID   Followed by   NOREEN ON 06/29/2024] amiodarone   200 mg Oral Daily   apixaban   5 mg Oral BID   atorvastatin   40 mg Oral Daily   furosemide   80 mg Oral Daily   insulin  aspart  0-15 Units  Subcutaneous TID WC   insulin  aspart  0-5 Units Subcutaneous QHS   metoprolol  succinate  100 mg Oral Daily   sodium chloride  flush  3 mL Intravenous Q12H   sodium chloride  flush  3 mL Intravenous Q12H   tamsulosin   0.4 mg Oral Daily    Continuous Infusions:    PRN Meds: acetaminophen , lip balm, ondansetron  **OR** ondansetron  (ZOFRAN ) IV, mouth rinse, sodium chloride  flush   Labs   Results for orders placed or performed during the hospital encounter of 05/31/24 (from the past 48 hours)  Glucose, capillary     Status: Abnormal   Collection Time: 06/06/24 11:19 AM  Result Value Ref Range   Glucose-Capillary 103 (H) 70 - 99 mg/dL    Comment: Glucose reference range applies only to samples taken after fasting for at least 8 hours.  Glucose, capillary     Status: Abnormal   Collection Time: 06/06/24  5:16 PM  Result Value Ref Range   Glucose-Capillary 139 (H) 70 - 99 mg/dL    Comment: Glucose reference range applies only to samples taken after fasting for at least 8 hours.  Glucose, capillary     Status: None   Collection Time: 06/06/24  9:23 PM  Result Value Ref Range   Glucose-Capillary 97 70 - 99 mg/dL    Comment:  Glucose reference range applies only to samples taken after fasting for at least 8 hours.   Comment 1 Notify RN    Comment 2 Document in Chart   CBC     Status: Abnormal   Collection Time: 06/07/24  5:48 AM  Result Value Ref Range   WBC 6.6 4.0 - 10.5 K/uL   RBC 3.48 (L) 4.22 - 5.81 MIL/uL   Hemoglobin 10.4 (L) 13.0 - 17.0 g/dL   HCT 68.5 (L) 60.9 - 47.9 %   MCV 90.2 80.0 - 100.0 fL   MCH 29.9 26.0 - 34.0 pg   MCHC 33.1 30.0 - 36.0 g/dL   RDW 85.1 88.4 - 84.4 %   Platelets 200 150 - 400 K/uL   nRBC 0.0 0.0 - 0.2 %    Comment: Performed at [facility], [address], [place], [place] [address]  Comprehensive metabolic panel with GFR     Status: Abnormal   Collection Time: 06/07/24  5:48 AM  Result Value Ref Range   Sodium 137 135 - 145 mmol/L    Potassium 4.2 3.5 - 5.1 mmol/L   Chloride 104 98 - 111 mmol/L   CO2 27 22 - 32 mmol/L   Glucose, Bld 101 (H) 70 - 99 mg/dL    Comment: Glucose reference range applies only to samples taken after fasting for at least 8 hours.   BUN 29 (H) 8 - 23 mg/dL   Creatinine, Ser 7.48 (H) 0.61 - 1.24 mg/dL   Calcium  8.1 (L) 8.9 - 10.3 mg/dL   Total Protein 5.8 (L) 6.5 - 8.1 g/dL   Albumin  2.9 (L) 3.5 - 5.0 g/dL   AST 19 15 - 41 U/L   ALT 13 0 - 44 U/L   Alkaline Phosphatase 28 (L) 38 - 126 U/L   Total Bilirubin 0.9 0.0 - 1.2 mg/dL   GFR, Estimated 26 (L) >60 mL/min    Comment: (NOTE) Calculated using the CKD-EPI Creatinine Equation (2021)    Anion gap 6 5 - 15    Comment: Performed at [facility], [address], [place], [place] [address]  Glucose, capillary     Status: Abnormal   Collection Time: 06/07/24  7:29 AM  Result Value Ref Range   Glucose-Capillary 100 (H) 70 - 99 mg/dL    Comment: Glucose reference range applies only to samples taken after fasting for at least 8 hours.  Glucose, capillary     Status: None   Collection Time: 06/07/24 11:13 AM  Result Value Ref Range   Glucose-Capillary 96 70 - 99 mg/dL    Comment: Glucose reference range applies only to samples taken after fasting for at least 8 hours.  Glucose, capillary     Status: Abnormal   Collection Time: 06/07/24  4:44 PM  Result Value Ref Range   Glucose-Capillary 127 (H) 70 - 99 mg/dL    Comment: Glucose reference range applies only to samples taken after fasting for at least 8 hours.  Glucose, capillary     Status: Abnormal   Collection Time: 06/07/24  9:49 PM  Result Value Ref Range   Glucose-Capillary 152 (H) 70 - 99 mg/dL    Comment: Glucose reference range applies only to samples taken after fasting for at least 8 hours.  CBC     Status: Abnormal   Collection Time: 06/08/24  4:29 AM  Result Value Ref Range   WBC 7.5 4.0 - 10.5 K/uL   RBC 3.49 (L) 4.22 -  5.81 MIL/uL   Hemoglobin 10.3 (L) 13.0 - 17.0  g/dL   HCT 68.4 (L) 60.9 - 47.9 %   MCV 90.3 80.0 - 100.0 fL   MCH 29.5 26.0 - 34.0 pg   MCHC 32.7 30.0 - 36.0 g/dL   RDW 85.1 88.4 - 84.4 %   Platelets 204 150 - 400 K/uL   nRBC 0.0 0.0 - 0.2 %    Comment: Performed at [facility], [address] [place], [place] [address]  Comprehensive metabolic panel with GFR     Status: Abnormal   Collection Time: 06/08/24  4:29 AM  Result Value Ref Range   Sodium 136 135 - 145 mmol/L   Potassium 3.8 3.5 - 5.1 mmol/L   Chloride 104 98 - 111 mmol/L   CO2 25 22 - 32 mmol/L   Glucose, Bld 164 (H) 70 - 99 mg/dL    Comment: Glucose reference range applies only to samples taken after fasting for at least 8 hours.   BUN 30 (H) 8 - 23 mg/dL   Creatinine, Ser 7.27 (H) 0.61 - 1.24 mg/dL   Calcium  8.2 (L) 8.9 - 10.3 mg/dL   Total Protein 5.9 (L) 6.5 - 8.1 g/dL   Albumin  2.9 (L) 3.5 - 5.0 g/dL   AST 22 15 - 41 U/L   ALT 16 0 - 44 U/L   Alkaline Phosphatase 33 (L) 38 - 126 U/L   Total Bilirubin 0.9 0.0 - 1.2 mg/dL   GFR, Estimated 24 (L) >60 mL/min    Comment: (NOTE) Calculated using the CKD-EPI Creatinine Equation (2021)    Anion gap 7 5 - 15    Comment: Performed at [facility], [address], [place], [place] [address]  Glucose, capillary     Status: Abnormal   Collection Time: 06/08/24  7:39 AM  Result Value Ref Range   Glucose-Capillary 126 (H) 70 - 99 mg/dL    Comment: Glucose reference range applies only to samples taken after fasting for at least 8 hours.    ECG   N/A  Telemetry   AFib rate controlled with PVC's and NSVT - Personally Reviewed  Radiology    No results found.   Cardiac Studies   As above  Assessment   Principal Problem:   AKI on CKD IIIb Active Problems:   Essential hypertension   Acute on chronic systolic (congestive) heart failure (HCC)   AF (paroxysmal atrial fibrillation) (HCC)   Coronary artery disease involving native coronary artery of native heart without angina pectoris   OSA on  CPAP   Diabetes mellitus type 2 with complications (HCC)   Right hydronephrosis due to ureteral stone   Class 1 obesity   Normocytic anemia   Persistent atrial fibrillation (HCC)   PVC's (premature ventricular contractions)   Plan   Urine output is slowing, creatinine up slightly today but around baseline. Will switch back to oral lasix  80 mg daily. Continue to hold Entresto  d/t hypotension. Continue with amidodarone taper to 200 mg daily. Eliquis  5 mg BID - will address possible DCCV at follow-up with me on 12/12.  Ok to d/c home today.  [facility] will sign off.   Medication Recommendations:  as above Other recommendations (labs, testing, etc):  none Follow up as an outpatient:  Dr. Mona on 12/12   Time Spent Directly with Patient:  I have spent a total of 25 minutes with the patient reviewing hospital notes, telemetry, EKGs, labs and examining the  patient as well as establishing an assessment and plan that was discussed personally with the patient.  > 50% of time was spent in direct patient care.  Length of Stay:  LOS: 8 days   Vinie KYM Maxcy, MD, [facility], FNLA, FACP  National  [facility]  Medical Director of the [facility] Lipid Disorders &  Cardiovascular Risk Reduction Clinic Diplomate of the American Board of Clinical Lipidology Attending Cardiologist  Direct Dial: [phone]  Fax: [phone]  Website:  [email or web address] Vinie BROCKS Lassie Demorest 06/08/2024, 9:08 AM

## 2024-06-08 NOTE — Progress Notes (Signed)
 DISCHARGE NOTE HOME Christopher [name] to be discharged Home per MD order. Discussed prescriptions and follow up appointments with the patient. Prescriptions given to patient; medication list explained in detail. Patient verbalized understanding.  Skin clean, dry and intact without evidence of skin break down, no evidence of skin tears noted. IV catheter discontinued intact. Site without signs and symptoms of complications. Dressing and pressure applied. Pt denies pain at the site currently. No complaints noted.  Patient free of lines, drains, and wounds.   An After Visit Summary (AVS) was printed and given to the patient. Patient escorted via wheelchair, to d/c lounge and is due TOC meds.  Jaydyn Menon A Proctor-Gann, RN

## 2024-06-08 NOTE — ED Triage Notes (Signed)
 Pt BIBA from home with c/o lower abdominal pain. Constipation x3 days. Took OTC laxative before coming in without relief. Pt has pacemaker. Hx cardiac arrest. Denies N/V/D.

## 2024-06-09 ENCOUNTER — Other Ambulatory Visit: Payer: Self-pay

## 2024-06-09 ENCOUNTER — Other Ambulatory Visit: Payer: Self-pay | Admitting: Urology

## 2024-06-09 ENCOUNTER — Telehealth: Payer: Self-pay | Admitting: *Deleted

## 2024-06-09 ENCOUNTER — Ambulatory Visit (HOSPITAL_COMMUNITY): Admitting: Internal Medicine

## 2024-06-09 ENCOUNTER — Telehealth: Payer: Self-pay | Admitting: Internal Medicine

## 2024-06-09 MED ORDER — SMOG ENEMA
960.0000 mL | Freq: Once | RECTAL | Status: AC
  Administered 2024-06-09: 960 mL via RECTAL
  Filled 2024-06-09: qty 960

## 2024-06-09 NOTE — Telephone Encounter (Signed)
 Primary Cardiologist:Kenneth C Hilty, MD  Chart reviewed as part of pre-operative protocol coverage. Because of Christopher [name]'s past medical history and time since last visit, he/she will require a follow-up visit in order to better assess preoperative cardiovascular risk.  Pre-op  covering staff: - Patient has appointment with Dr. Mona on 06/16/24 at which time clearance will be addressed. Appointment notes have been updated.  - Please contact requesting surgeon's office via preferred method (i.e, phone, fax) to inform them of need for appointment prior to surgery.  If applicable, this message will also be routed to pharmacy pool and/or primary cardiologist for input on holding anticoagulant/antiplatelet agent as requested below so that this information is available at time of patient's appointment.   [name], NP-C  06/09/2024, 4:04 PM [address], [address] [place], [place] [address] Office [phone] Fax [phone]

## 2024-06-09 NOTE — Telephone Encounter (Signed)
   Pre-operative Risk Assessment    Patient Name: Christopher [name]  DOB: 03/02/1951 MRN: [id number]      Request for Surgical Clearance    Procedure: Ureteroscopy Laser and Stent Placement  Date of Surgery:  Clearance 06/28/24                                 Surgeon: Dr. Carolee Surgeon's Group or Practice Name:  [facility] Phone number:  [phone] ext [phone] Fax number:  [phone]   Type of Clearance Requested:   Medical and pharmacy. Would like to hold any blood thinners   Type of Anesthesia:  General    Additional requests/questions:  N/A  Signed, Rosina MARLA Server   06/09/2024, 3:47 PM

## 2024-06-09 NOTE — Transitions of Care (Post Inpatient/ED Visit) (Signed)
 06/09/2024  Name: Christopher [name] MRN: [id number] DOB: [birth year]  Today's TOC FU Call Status: Today's TOC FU Call Status:: Successful TOC FU Call Completed TOC FU Call Complete Date: 06/09/24  Patient's Name and Date of Birth confirmed. Name, DOB  Transition Care Management Follow-up Telephone Call Date of Discharge: 06/08/24 Discharge Facility: [facility]) Type of Discharge: Inpatient Admission Primary Inpatient Discharge Diagnosis:: AKI: (R) kidney stone with uretal stent placement; Acute on chronic CHF with cardiac catherization How have you been since you were released from the hospital?: Better (I guess I am better; I went back to the ER last night because I am very constipated- they said I was no impacted and they told me to keep taking the miralax  so that is what I am doing.  I do the best I can to take care of myself living alone) Any questions or concerns?: Yes Patient Questions/Concerns:: Constipation post recent hospitalization: went to ED for same less than 12 hours post-hospital discharge Patient Questions/Concerns Addressed: Other: (Reviewed ED visit; provided education around interventions for ongoing constipation; successfully enrolled into TOC 30-day program)  Items Reviewed: Did you receive and understand the discharge instructions provided?: Yes (thoroughly reviewed with patient who verbalizes good understanding of same) Medications obtained,verified, and reconciled?: Yes (Medications Reviewed) (Full medication reconciliation/ review completed; no concerns or discrepancies identified; confirmed patient obtained/ is taking all newly Rx'd medications as instructed; self-manages medications and denies questions/ concerns around medications today) Confirmed patient is not currently taking amiodarone  and fenofibrate : per stated preference/ instruction of cardiology provider: Dr. Mona told me no to take the fenofibrate  and he knows I could not tolerate the side effects  of the amiodarone  Any new allergies since your discharge?: No Dietary orders reviewed?: Yes Type of Diet Ordered:: Regular Do you have support at home?: Yes People in Home [RPT]: alone Name of Support/Comfort Primary Source: Reports independent in self-care activities; resides alone; has local girlfriend and grandson that sometimes check on me; reports friends and neighbors also assists as/ if needed/ indicated if they are available  Medications Reviewed Today: Medications Reviewed Today     Reviewed by Fujie Dickison M, RN (Registered Nurse) on 06/09/24 at 1319  Med List Status: <None>   Medication Order Taking? Sig Documenting Provider Last Dose Status Informant  Accu-Chek Softclix Lancets lancets [id number] Yes 1 each by Other route See admin instructions. Use TID [name], MD  Active Self, Pharmacy Records  acetaminophen  (TYLENOL ) 500 MG tablet [id number] Yes Take 1,000 mg by mouth every 6 (six) hours as needed for mild pain (pain score 1-3) or headache. [provider]  Active Self, Pharmacy Records  amiodarone  (PACERONE ) 200 MG tablet [id number]  Take 1 tablet (200 mg total) by mouth 2 (two) times daily for 30 days, THEN 1 tablet (200 mg total) daily.  Patient not taking: No sig reported   Camnitz, Will Lunger, MD  Active Self, Pharmacy Records  apixaban  (ELIQUIS ) 5 MG TABS tablet [id number] Yes Take 1 tablet (5 mg total) by mouth 2 (two) times daily. Gonfa, Taye T, MD  Active   atorvastatin  (LIPITOR) 40 MG tablet [id number] Yes Take 1 tablet (40 mg total) by mouth daily. [name], MD  Active Self, Pharmacy Records  cetirizine (ZYRTEC) 10 MG tablet [id number] Yes Take 10 mg by mouth daily. [provider]  Active Self, Pharmacy Records  cholecalciferol  (VITAMIN D ) 25 MCG (1000 UNIT) tablet [id number] Yes Take 1,000 Units by mouth daily. [provider]  Active Self, Pharmacy Records  Choline Fenofibrate  (FENOFIBRIC ACID ) 45 MG CPDR  [id number]  Take 1 tablet by mouth daily.  Patient not taking: Reported on 06/09/2024   Mona Vinie BROCKS, MD  Active Self, Pharmacy Records  clopidogrel  (PLAVIX ) 75 MG tablet [id number] Yes Take 1 tablet (75 mg total) by mouth daily. [name], MD  Active Self, Pharmacy Records  dapagliflozin  propanediol (FARXIGA ) 10 MG TABS tablet [id number] Yes Take 1 tablet (10 mg total) by mouth daily. [name], MD  Active Self, Pharmacy Records  fluticasone  (FLONASE ) 50 MCG/ACT nasal spray [id number] Yes Place 2 sprays into both nostrils daily. [name], MD  Active Self, Pharmacy Records  furosemide  (LASIX ) 80 MG tablet [id number] Yes Take 1 tablet (80 mg total) by mouth daily. Mona Vinie BROCKS, MD  Active Self, Pharmacy Records  glucose blood (ACCU-CHEK GUIDE TEST) test strip [id number] Yes Use as instructed [name], MD  Active Self, Pharmacy Records  Insulin  Lispro Prot & Lispro (HUMALOG  MIX 75/25 KWIKPEN) (75-25) 100 UNIT/ML Kary [id number] Yes Inject 18 Units into the skin in the morning and at bedtime. [name], MD  Active Self, Pharmacy Records  metFORMIN  (GLUCOPHAGE ) 500 MG tablet [id number] Yes Take 1 tablet (500 mg total) by mouth 2 (two) times daily with a meal. Kathrin Mignon DASEN, MD  Active   metoprolol  succinate (TOPROL -XL) 100 MG 24 hr tablet [id number] Yes Take 1 tablet (100 mg total) by mouth daily. [name], MD  Active Self, Pharmacy Records  OVER THE COUNTER MEDICATION [id number] Yes Take 1 tablet by mouth in the morning and at bedtime. [facility] - Restore multivitamin [provider]  Active Pharmacy Records, Self  spironolactone  (ALDACTONE ) 25 MG tablet [id number] Yes TAKE 1 TABLET BY MOUTH DAILY Hilty, Vinie BROCKS, MD  Active Self, Pharmacy Records  tamsulosin  (FLOMAX ) 0.4 MG CAPS capsule [id number] Yes Take 1 capsule (0.4 mg total) by mouth daily. [name], MD  Active Self, Pharmacy Records            Home Care and Equipment/Supplies: Were Home Health Services Ordered?: No Any new equipment or medical supplies ordered?: No  Functional Questionnaire: Do you need assistance with bathing/showering or dressing?: No Do you need assistance with meal preparation?: No Do you need assistance with eating?: No Do you have difficulty maintaining continence: No (I sometimes leak urine, but not very often; do not wear any kind of depends) Do you need assistance with getting out of bed/getting out of a chair/moving?: No Do you have difficulty managing or taking your medications?: No  Follow up appointments reviewed: PCP Follow-up appointment confirmed?: Yes (care coordination outreach in real-time with scheduling care guide to successfully schedule hospital follow up PCP appointment 06/16/24) Date of PCP follow-up appointment?: 06/16/24 Follow-up Provider: PCP- covering provider Corean Ku NP Specialist Hospital Follow-up appointment confirmed?: Yes Date of Specialist follow-up appointment?: 06/16/24 Follow-Up Specialty Provider:: cardiology provider Do you need transportation to your follow-up appointment?: No Do you understand care options if your condition(s) worsen?: Yes-patient verbalized understanding  SDOH Interventions Today    Flowsheet Row Most Recent Value  SDOH Interventions   Food Insecurity Interventions Intervention Not Indicated  Housing Interventions Intervention Not Indicated  Transportation Interventions Intervention Not Indicated  [reports I drive myself most of the time reports family- friends might be able to assist if needed but not sure]  Utilities Interventions Intervention Not Indicated   See TOC assessment tabs for additional assessment- care plan/ TOC  intervention information  Successfully enrolled into 30-day TOC program  Provided my direct contact information should questions/ concerns/ needs arise post-TOC initial call, prior to next [facility]  30-day program RN CM telephone visit    I appreciate the opportunity to participate in Steve's care,  Pls call/ message for questions,  Marlin Brys Mckinney Auguste Tebbetts, RN, BSN, CCRN Alumnus RN Care Manager  Transitions of Care  [facility] [employer] [facility] [phone]: direct office

## 2024-06-09 NOTE — ED Provider Notes (Signed)
 [place] [facility] AT [facility] Provider Note  CSN: [id number] Arrival date & time: 06/08/24 2315  Chief Complaint(s) Abdominal Pain  HPI Christopher [name] is a 73 y.o. male  With a past medical history listed below discharged yesterday from the hospital after admission for acute on chronic CHF exacerbation and right hydronephrosis secondary to renal stone requiring ureteral stent.  He presents for constipation for the last 3 days.  States that he has been taking MiraLAX  once daily without a bowel movement.  Feels like he has to use the restroom but unable to.  Endorses tenesmus.  Denies any abdominal pain.  No nausea or vomiting.  No other physical complaints.  The history is provided by the patient.    Past Medical History Past Medical History:  Diagnosis Date   Arthritis    Atrial fibrillation (HCC)    post op, intol of anticoag   Cardiac arrest [facility])    a. s/p MDT single chamber ICD; 11-10-18- pt denies having a heart attack   Cataract    CHF (congestive heart failure) (HCC)    Chronic kidney disease    kidney stone   Coronary artery disease    a. 2/7 Cath: LM nl, LAD min irregs, LCX min irregs, RI 40, RCA 56m, EF 55-60% basal to mid inf HK, 3-4+ MR;  b. 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 Mitral valve prolapse with flail segment of posterior leaflet and severe MR by TEE, remote h/o bacterial endocarditis    Sleep apnea    NPSG 01/21/06- AHI 40.7/hr cpap    Stroke [facility])    summer 2022- one messed up vision, the other balance   Subacute bacterial endocarditis 03/22/2008   Strep viridans   Ventricular fibrillation (HCC) 11/07/2019   appropriate shock (36J) for VF delivered   Patient Active Problem List   Diagnosis Date Noted   PVC's (premature ventricular contractions) 06/05/2024   Persistent atrial fibrillation (HCC) 06/04/2024   Right hydronephrosis due to ureteral stone 06/03/2024   Class 1 obesity 06/03/2024   Normocytic anemia 06/03/2024   Hypotension 05/31/2024   CKD stage 3b, GFR 30-44 ml/min (HCC) 05/31/2024   AKI on CKD IIIb 05/31/2024   Chronic low back pain 05/22/2022   Research study patient 11/28/2021   Diabetes mellitus type 2 with complications (HCC) 02/15/2021   Vertigo as late effect of stroke 02/14/2021   Ventricular tachycardia (HCC) 01/03/2020   Nonischemic cardiomyopathy (HCC) 09/27/2017   Heart valve disease 09/27/2017   History of cardiac arrest 09/27/2017   Alcohol use 05/05/2017   Coronary artery disease involving native coronary artery of native heart without angina pectoris 05/05/2017   OSA on CPAP 05/05/2017   History of repair of mitral valve 05/05/2017   Acute cough 08/29/2015   Benign prostatic hyperplasia 05/16/2015   Routine general medical examination at a health care facility 11/13/2014   Acute on chronic systolic (congestive) heart failure (HCC) 10/25/2012  AF (paroxysmal atrial fibrillation) (HCC) 10/25/2012   Hyperlipidemia associated with type 2 diabetes mellitus (HCC) 09/04/2012   Severe mitral regurgitation    Essential hypertension 09/27/2008   Home Medication(s) Prior to Admission medications   Medication Sig Start Date End Date Taking? Authorizing Provider  Accu-Chek Softclix Lancets lancets 1 each by Other route See admin instructions. Use TID 11/30/23   [name], MD  acetaminophen  (TYLENOL ) 500 MG tablet Take 1,000 mg by mouth every 6 (six) hours as needed for mild  pain (pain score 1-3) or headache.    [provider]  amiodarone  (PACERONE ) 200 MG tablet Take 1 tablet (200 mg total) by mouth 2 (two) times daily for 30 days, THEN 1 tablet (200 mg total) daily. Patient not taking: Reported on 06/02/2024 05/30/24 06/24/25  Inocencio Soyla Lunger, MD  apixaban  (ELIQUIS ) 5 MG TABS tablet Take 1 tablet (5 mg total) by mouth 2 (two) times daily. 06/08/24   Gonfa, Taye T, MD  atorvastatin  (LIPITOR) 40 MG tablet Take 1 tablet (40 mg total) by mouth daily. 11/30/23   [name], MD  cetirizine (ZYRTEC) 10 MG tablet Take 10 mg by mouth daily.    [provider]  cholecalciferol  (VITAMIN D ) 25 MCG (1000 UNIT) tablet Take 1,000 Units by mouth daily.    [provider]  Choline Fenofibrate  (FENOFIBRIC ACID ) 45 MG CPDR Take 1 tablet by mouth daily. 03/03/24   Hilty, Vinie BROCKS, MD  clopidogrel  (PLAVIX ) 75 MG tablet Take 1 tablet (75 mg total) by mouth daily. 11/30/23   [name], MD  dapagliflozin  propanediol (FARXIGA ) 10 MG TABS tablet Take 1 tablet (10 mg total) by mouth daily. 11/30/23   [name], MD  fluticasone  (FLONASE ) 50 MCG/ACT nasal spray Place 2 sprays into both nostrils daily. 11/30/23   [name], MD  furosemide  (LASIX ) 80 MG tablet Take 1 tablet (80 mg total) by mouth daily. 03/31/24 06/29/24  Mona Vinie BROCKS, MD  glucose blood (ACCU-CHEK GUIDE TEST) test strip Use as instructed 11/30/23   [name], MD  Insulin  Lispro Prot & Lispro (HUMALOG  MIX 75/25 KWIKPEN) (75-25) 100 UNIT/ML Kwikpen Inject 18 Units into the skin in the morning and at bedtime. 11/30/23   [name], MD  metFORMIN  (GLUCOPHAGE ) 500 MG tablet Take 1 tablet (500 mg total) by mouth 2 (two) times daily with a meal. 06/08/24   Kathrin Mignon DASEN, MD  metoprolol  succinate (TOPROL -XL) 100 MG 24 hr tablet Take 1 tablet (100 mg total) by mouth daily. 11/30/23   [name], MD  OVER THE COUNTER MEDICATION Take 1  tablet by mouth in the morning and at bedtime. [facility] - Restore multivitamin    [provider]  spironolactone  (ALDACTONE ) 25 MG tablet TAKE 1 TABLET BY MOUTH DAILY 03/24/24   Hilty, Vinie BROCKS, MD  tamsulosin  (FLOMAX ) 0.4 MG CAPS capsule Take 1 capsule (0.4 mg total) by mouth daily. Patient taking differently: Take 0.4 mg by mouth daily after supper. 11/30/23   [name], MD  Allergies Codeine, Dust mite extract, and Pollen extract  Review of Systems Review of Systems As noted in HPI  Physical Exam Vital Signs  I have reviewed the triage vital signs BP 123/61   Pulse 90   Resp 15   SpO2 97%   Physical Exam Vitals reviewed. Exam conducted with a chaperone present.  Constitutional:      General: He is not in acute distress.    Appearance: He is well-developed. He is not diaphoretic.  HENT:     Head: Normocephalic and atraumatic.     Right Ear: External ear normal.     Left Ear: External ear normal.     Nose: Nose normal.     Mouth/Throat:     Mouth: Mucous membranes are moist.  Eyes:     General: No scleral icterus.    Conjunctiva/sclera: Conjunctivae normal.  Neck:     Trachea: Phonation normal.  Cardiovascular:     Rate and Rhythm: Normal rate and regular rhythm.  Pulmonary:     Effort: Pulmonary effort is normal. No respiratory distress.     Breath sounds: No stridor.  Abdominal:     General: There is no distension.     Tenderness: There is no abdominal tenderness.  Genitourinary:    Comments: Firm stool high in the rectal vault at tip of finger.  Musculoskeletal:        General: Normal range of motion.     Cervical back: Normal range of motion.  Neurological:     Mental Status: He is alert and oriented to person, place, and time.  Psychiatric:        Behavior: Behavior normal.     ED Results and  Treatments Labs (all labs ordered are listed, but only abnormal results are displayed) Labs Reviewed - No data to display                                                                                                                       EKG  EKG Interpretation Date/Time:    Ventricular Rate:    PR Interval:    QRS Duration:    QT Interval:    QTC Calculation:   R Axis:      Text Interpretation:         Radiology No results found.  Medications Ordered in ED Medications  sorbitol , magnesium  hydroxide, mineral oil, glycerin (SMOG) enema (960 mLs Rectal Given 06/09/24 0245)   Procedures Procedures  (including critical care time) Medical Decision Making / ED Course   Medical Decision Making   Contipation. Exam not consistent with fecal impaction amenable to digital disimpaction. Given enema, w/o relief. Abd benign and not concerning for bowel obstruction or inflammatory/infectious process requiring imaging.  Miralax  dosing for home recommended.     Final Clinical Impression(s) / ED Diagnoses Final diagnoses:  Other constipation   The patient appears reasonably screened and/or stabilized for discharge and I doubt any other medical condition or other [facility] requiring further screening,  evaluation, or treatment in the ED at this time. I have discussed the findings, Dx and Tx plan with the patient/family who expressed understanding and agree(s) with the plan. Discharge instructions discussed at length. The patient/family was given strict return precautions who verbalized understanding of the instructions. No further questions at time of discharge.  Disposition: Discharge  Condition: Good  ED Discharge Orders     None         Follow Up: [name], MD [address] [place] [address] [phone]  Call  to schedule an appointment for close follow up     This chart was dictated using voice recognition software.  Despite best efforts to  proofread,  errors can occur which can change the documentation meaning.    Trine Raynell Moder, MD 06/09/24 [phone]

## 2024-06-09 NOTE — Discharge Instructions (Signed)
 Please start taking 1 capful 1-3 times a day and slowly cut back as needed when you have normal bowel movements. You may stop at any point if you are having too much watery stools (more than 2 - 3 in a day).

## 2024-06-09 NOTE — Patient Instructions (Signed)
 Visit Information  Thank you for taking time to visit with me today. Please don't hesitate to contact me if I can be of assistance to you before our next scheduled telephone appointment.  Our next appointment is by telephone on Tuesday, June 13, 2024 at 2:30 pm  Please call the care guide team at [phone] if you need to cancel or reschedule your appointment.   Patient Self Care Activities:  Attend all scheduled provider appointments Call provider office for new concerns or questions  Participate in Transition of Care Program/Attend TOC scheduled calls Take medications as prescribed   use salt in moderation watch for swelling in feet, ankles and legs every day know when to call the doctor:for shortness of breath that is increased from your normal breathing; for increased swelling in your legs- ankles- feet or abdomen check blood sugar at prescribed times: once daily and when you have symptoms of low or high blood sugar take the blood sugar log to all doctor visits Continue pacing activity to avoid episodes of shortness of breath Continue monitoring and recording your blood sugars at home Use assistive devices as needed to prevent falls- walker / cane as needed Eat a heart healthy and low salt diet Please go over all of your medications and medication question/ concerns with your care providers (doctor) If you believe your condition is getting worse- contact your care providers (doctors) promptly- reaching out to your doctor early when you have concerns can prevent you from having to go to the hospital  Following is a copy of your care plan:   Goals Addressed             This Visit's Progress    [facility] Transitions of Care (TOC) Care Plan   On track    Problems:  No Hospital Follow Up Provider appointment- care coordination outreach in real-time with scheduling care guide to successfully schedule hospital follow up PCP appointment 06/16/24  Lives alone, limited social/ family  support; independent in self-care activities; no assistive devices at baseline; drives self Needs education- reinforcement around self- management of multiple chronic health conditions; medications Recent hospitalization November 26- June 08, 2024 for AKI/ kidney stone; Acute on Chronic CHF (2) unplanned hospitalizations x last (12) months; (1) x last 6 months  Goal:  Over the next 30 days, the patient will not experience hospital readmission  Interventions:  Transitions of Care: week # 1/ day # 1 Durable Medical Equipment (DME) needs assessed with patient/caregiver Doctor Visits  - discussed the importance of doctor visits Communication with PCP re: enrollment into [facility] 30-day program; facilitation of hospital follow up office visit scheduling; need for medication review at time of hospital follow up office visit  Arranged PCP follow-up within 7 days (Care Guide Scheduled) Post discharge activity limitations prescribed by provider reviewed Provided education/ reinforcement around fall prevention- has walker/ cane rarely uses Reviewed multiple upcoming provider office visits: 06/16/24: cardiology and PCP providers- confirmed patient aware of all and has plans to attend as scheduled Reinforced signs/ symptoms UTI along with corresponding action plan  Encouraged patient to promptly schedule with urology provider for post-hospital follow up visit as instructed, confirmed as contact information Provided my direct contact information should questions/ concerns/ needs arise post-TOC call, prior to next RN CM telephone visit   Heart Failure Interventions: Basic overview and discussion of pathophysiology of Heart Failure reviewed Provided education on low sodium diet Assessed need for readable accurate scales in home Advised patient to weigh each morning after emptying  bladder Discussed importance of daily weight and advised patient to weigh and record daily Reviewed role of diuretics in  prevention of fluid overload and management of heart failure; Discussed the importance of keeping all appointments with provider Advised patient to discuss whether or not he should be monitoring daily weights at home with provider Screening for signs and symptoms of depression related to chronic disease state  Assessed social determinant of health barriers  Confirmed patient is NOT currently obtaining daily weights at home: provided education / rationale for daily weight monitoring at home as earliest indicator of fluid retention, along with weight gain guidelines/ action plan for weight gain; importance of taking diuretic as prescribed Reports he has unlevel floors at home, all with carpet; also reports I just don't want to weigh myself every day- I don't like doing it Dr. Mona knows I don't do the weights at home Encouraged patient to discuss his lack of daily weights at home with cardiology provider at upcoming scheduled visit 06/16/24 Provided education around (late) signs and symptoms (other than weight gain) that indicate fluid retention, along with corresponding action plan should these signs/ symptoms develop Provided education around newly prescribed ACT therapy: consideration- precautions to take when taking ACT; need for new prescription for subsequent refill needs  Diabetes Interventions: Provided education to patient about basic DM disease process Reviewed medications with patient and discussed importance of medication adherence Discussed plans with patient for ongoing care management follow up and provided patient with direct contact information for care management team Review of patient status, including review of consultants reports, relevant laboratory and other test results, and medications completed Confirmed patient continues monitoring blood sugars at home: every day fasting  Provided education/ reinforcement re: signs/ symptoms low blood sugar with corresponding action  plan- patient reports has had low blood sugars in past Reviewed most recent A1C value from 06/01/24: 5.9; reviewed historical trends over time with patient who verbalizes good understanding of same Made patient's PCP aware of his report of prior hypoglycemic episodes, give his current robust medication regimen for diabetes medications and A1-C of 5.9   Lab Results  Component Value Date   HGBA1C 5.9 (H) 06/01/2024   Patient Self Care Activities:  Attend all scheduled provider appointments Call provider office for new concerns or questions  Participate in Transition of Care Program/Attend TOC scheduled calls Take medications as prescribed   use salt in moderation watch for swelling in feet, ankles and legs every day know when to call the doctor:for shortness of breath that is increased from your normal breathing; for increased swelling in your legs- ankles- feet or abdomen check blood sugar at prescribed times: once daily and when you have symptoms of low or high blood sugar take the blood sugar log to all doctor visits Continue pacing activity to avoid episodes of shortness of breath Continue monitoring and recording your blood sugars at home Use assistive devices as needed to prevent falls- walker / cane as needed Eat a heart healthy and low salt diet Please go over all of your medications and medication question/ concerns with your care providers (doctor) If you believe your condition is getting worse- contact your care providers (doctors) promptly- reaching out to your doctor early when you have concerns can prevent you from having to go to the hospital  Plan:  Telephone follow up appointment with care management team member scheduled for:  Friday 06/16/24       Care plan and visit instructions communicated with the patient  verbally today. Patient agrees to receive a copy in MyChart. Active MyChart status and patient understanding of how to access instructions and care plan via  MyChart confirmed with patient.     Telephone follow up appointment with care management team member scheduled for:  If you are experiencing a Mental Health or Behavioral Health Crisis or need someone to talk to, please  call the Suicide and Crisis Lifeline: 988 call the [place]  National Suicide Prevention Lifeline: (902)779-6169 or TTY: (226)073-6934 TTY 6397724798) to talk to a trained counselor call 1-800-273-TALK (toll free, 24 hour hotline) go to [facility] [address] [place] [phone]) call the [facility] Crisis Line: [phone] call 911   Beatris Blinda Lawrence, RN, BSN, CCRN Alumnus RN Care Manager  Transitions of Care  [facility] - Population Health  [place] [phone]: direct office

## 2024-06-09 NOTE — ED Notes (Signed)
 Patient has not been able to have a BM after having enema.

## 2024-06-11 NOTE — Telephone Encounter (Signed)
 Patient with diagnosis of afib on Eliquis  for anticoagulation.    Procedure: Ureteroscopy Laser and Stent Placement  Date of procedure: 06/28/24   CHA2DS2-VASc Score = 7   This indicates a 11.2% annual risk of stroke. The patient's score is based upon: CHF History: 1 HTN History: 1 Diabetes History: 1 Stroke History: 2 Vascular Disease History: 1 Age Score: 1 Gender Score: 0      CrCl 28 ml/min Platelet count 204  Patient has noit had an Afib/aflutter ablation in the last 3 months, DCCV within the last 4 weeks or a watchman implanted in the last 45 days   Per office protocol, patient can hold Eliquis  for 2.5 days prior to procedure.    **This guidance is not considered finalized until pre-operative APP has relayed final recommendations.**

## 2024-06-12 NOTE — Telephone Encounter (Signed)
 Dr. Katharina notes faxed to [facility]. Will remove from preop pool at this time given no date for rescheduled procedure.  [name], NP-C  06/12/2024, 3:50 PM [address], [address] [place], [place] [address] Office [phone] Fax [phone]

## 2024-06-12 NOTE — Telephone Encounter (Signed)
 Thank you DR. Hilty for your notes. I will update the requesting office as FYI; and to be aware of your office notes to come after the appt 06/16/24

## 2024-06-12 NOTE — Telephone Encounter (Signed)
 Pt has appt 06/16/24 Dr. Mona. I will update all parties involved.

## 2024-06-13 ENCOUNTER — Other Ambulatory Visit: Payer: Self-pay | Admitting: *Deleted

## 2024-06-13 NOTE — Patient Instructions (Signed)
 Visit Information  Thank you for taking time to visit with me today. Please don't hesitate to contact me if I can be of assistance to you before our next scheduled telephone appointment.  Our next appointment is by telephone on Tuesday June 20, 2024 at 2:30 pm  Please call the care guide team at [phone] if you need to cancel or reschedule your appointment.   Following are the goals we discussed today:  Patient Self Care Activities:  Attend all scheduled provider appointments Call provider office for new concerns or questions  Participate in Transition of Care Program/Attend TOC scheduled calls Take medications as prescribed   use salt in moderation watch for swelling in feet, ankles and legs every day know when to call the doctor:for shortness of breath that is increased from your normal breathing; for increased swelling in your legs- ankles- feet or abdomen check blood sugar at prescribed times: once daily and when you have symptoms of low or high blood sugar take the blood sugar log to all doctor visits Continue pacing activity to avoid episodes of shortness of breath Continue monitoring and recording your blood sugars at home Use assistive devices as needed to prevent falls- walker / cane as needed Eat a heart healthy and low salt diet Please go over all of your medications and medication question/ concerns with your care providers (doctor) If you believe your condition is getting worse- contact your care providers (doctors) promptly- reaching out to your doctor early when you have concerns can prevent you from having to go to the hospital  If you are experiencing a Mental Health or Behavioral Health Crisis or need someone to talk to, please  call the Suicide and Crisis Lifeline: 988 call the [place]  National Suicide Prevention Lifeline: 253-732-7804 or TTY: (979) 722-7562 TTY (850)423-8079) to talk to a trained counselor call 1-800-273-TALK (toll free, 24 hour hotline) go to  [facility] [address], [place] [phone]) call the [facility] Crisis Line: (228)231-6090 call 911   Care plan and visit instructions communicated with the patient verbally today. Patient agrees to receive a copy in MyChart. Active MyChart status and patient understanding of how to access instructions and care plan via MyChart confirmed with patient.     Iosefa Weintraub Mckinney Nekayla Heider, RN, BSN, Media Planner  Transitions of Care  [facility] - [facility] [phone]: direct office

## 2024-06-13 NOTE — Transitions of Care (Post Inpatient/ED Visit) (Signed)
 Transition of Care week 2/ day # 5  Visit Note  06/13/2024  Name: Christopher [name] MRN: [id number]          DOB: May 15, 1951  Situation: Patient enrolled in [facility] 30-day program. Visit completed with patient by telephone.   HIPAA identifiers x 2 verified  Background:  Lives alone, limited social/ family support; independent in self-care activities; no assistive devices at baseline; drives self Needs education- reinforcement around self- management of multiple chronic health conditions; medications Recent hospitalization November 26- June 08, 2024 for AKI/ kidney stone; Acute on Chronic CHF (2) unplanned hospitalizations x last (12) months; (1) x last 6 months  Initial Transition Care Management Follow-up Telephone Call Discharge Date and Diagnosis: 06/08/24, AKI: (R) kidney stone with uretal stent placement; Acute on chronic CHF with cardiac catherization   Past Medical History:  Diagnosis Date   Arthritis    Atrial fibrillation (HCC)    post op, intol of anticoag   Cardiac arrest (HCC)    a. s/p MDT single chamber ICD; 11-10-18- pt denies having a heart attack   Cataract    CHF (congestive heart failure) (HCC)    Chronic kidney disease    kidney stone   Coronary artery disease    a. 2/7 Cath: LM nl, LAD min irregs, LCX min irregs, RI 40, RCA 18m, EF 55-60% basal to mid inf HK, 3-4+ MR;  b. 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 Mitral valve prolapse with flail segment of  posterior leaflet and severe MR by TEE, remote h/o bacterial endocarditis    Sleep apnea    NPSG 01/21/06- AHI 40.7/hr cpap   Stroke [facility])    summer 2022- one messed up vision, the other balance   Subacute bacterial endocarditis 03/22/2008   Strep viridans   Ventricular fibrillation (HCC) 11/07/2019   appropriate shock (36J) for VF delivered   Assessment:  I finally went to the bathroom and that is all straightened out now.  No more constipation.  My blood sugars are still running lower than they used to, since they released me from the hospital.  I have had some numbers as low as in the 70's so I just drink the orange juice when that happens.  I still don't feel great, but I guess I am doing alright.  I am not going to weigh myself at home- my floors are not level and even if they were- I just don't want to weigh myself at home every day so I am not going to    Denies clinical concerns and sounds to be in no distress throughout [facility] 30-day program outreach call today  Patient Reported Symptoms: Cognitive Cognitive Status: Normal speech and language skills, Alert and oriented to person, place, and time, Insightful and able to interpret abstract concepts, Difficulties with attention and concentration Cognitive/Intellectual Conditions Management [RPT]: None reported or documented in medical history or problem list      Neurological Neurological  Review of Symptoms: No symptoms reported    HEENT HEENT Symptoms Reported: No symptoms reported      Cardiovascular Cardiovascular Symptoms Reported: No symptoms reported Other Cardiovascular Symptoms: Continues to decline performing daily weights at home: I told you I don't want no part of weighing myself at home every day.  I don't like doing it and I am not going to do it Denies presence of swelling in lower extremities/ abdomen Does patient have uncontrolled Hypertension?: No Cardiovascular Management Strategies: Routine screening, Medication  therapy, Coping strategies, Adequate rest  Respiratory Respiratory Symptoms Reported: No symptoms reported Other Respiratory Symptoms: Reports I get winded sometimes when I am active, but once I stop what I am doing and rest, it goes away Sounds to be in no distress throughout [facility] call Respiratory Management Strategies: Adequate rest, Coping strategies, Medication therapy, Routine screening  Endocrine Endocrine Symptoms Reported: Hypoglycemia Is patient diabetic?: Yes Is patient checking blood sugars at home?: Yes List most recent blood sugar readings, include date and time of day: Reports periodic low blood sugars at home for QD fasting values: sometimes it makes me feel bad, sometimes it doesn't; reports several home values in the 70's; confirms he is not recording blood sugars at home- I don't like writing all this stuff down; informed patient that I had made his PCP team aware of his reported periodic episodes of low blood sugars at home: encouraged him to also report when he attends 06/16/24 scheduled office visit: he verbalizes agreement; reports before it started dropping into the 70's since I got home from the hospital, it was almost always between 90-110    Gastrointestinal Gastrointestinal Symptoms Reported: No symptoms reported Additional Gastrointestinal Details: Confirms successfully had BM post- recent ED visit for constipation: I finally went to the bathroom and I have been normal and regular ever since reports last BM today Gastrointestinal Management Strategies: Coping strategies    Genitourinary Genitourinary Symptoms Reported: No symptoms reported    Integumentary Integumentary Symptoms Reported: No symptoms reported    Musculoskeletal Musculoskelatal Symptoms Reviewed: No symptoms reported Additional Musculoskeletal Details: confirmed still not currently requiring/ using assistive devices for ambulation Musculoskeletal Management Strategies: Routine screening,  Coping strategies      Psychosocial           There were no vitals filed for this visit.    Medications Reviewed Today     Reviewed by Soni Kegel M, RN (Registered Nurse) on 06/13/24 at 1430  Med List Status: <None>   Medication Order Taking? Sig Documenting Provider Last Dose Status Informant  Accu-Chek Softclix Lancets lancets [id number]  1 each by Other route See admin instructions. Use TID [name], MD  Active Self, Pharmacy Records  acetaminophen  (TYLENOL ) 500 MG tablet [id number]  Take 1,000 mg by mouth every 6 (six) hours as needed for mild pain (pain score 1-3) or headache. [provider]  Active Self, Pharmacy Records  amiodarone  (PACERONE ) 200 MG tablet [id number]  Take 1 tablet (200 mg total) by mouth 2 (two) times daily for 30 days, THEN 1 tablet (200 mg total) daily.  Patient not taking: No sig reported   Camnitz, Will Gladis, MD  Active Self, Pharmacy Records  apixaban  (ELIQUIS ) 5 MG TABS tablet [id number]  Take 1 tablet (5 mg total) by mouth 2 (two) times daily. Gonfa, Taye T, MD  Active   atorvastatin  (LIPITOR) 40 MG tablet [id number]  Take 1 tablet (40 mg total) by mouth daily. [name], MD  Active Self,  Pharmacy Records  cetirizine (ZYRTEC) 10 MG tablet [id number]  Take 10 mg by mouth daily. [provider]  Active Self, Pharmacy Records  cholecalciferol  (VITAMIN D ) 25 MCG (1000 UNIT) tablet [id number]  Take 1,000 Units by mouth daily. [provider]  Active Self, Pharmacy Records  Choline Fenofibrate  (FENOFIBRIC ACID ) 45 MG CPDR [id number]  Take 1 tablet by mouth daily.  Patient not taking: Reported on 06/09/2024   Mona Vinie BROCKS, MD  Active Self, Pharmacy Records  clopidogrel  (PLAVIX ) 75 MG tablet [id number]  Take 1 tablet (75 mg total) by mouth daily. [name], MD  Active Self, Pharmacy Records  dapagliflozin  propanediol (FARXIGA ) 10 MG TABS tablet [id number]  Take 1 tablet (10 mg total) by mouth daily.  [name], MD  Active Self, Pharmacy Records  fluticasone  (FLONASE ) 50 MCG/ACT nasal spray [id number]  Place 2 sprays into both nostrils daily. [name], MD  Active Self, Pharmacy Records  furosemide  (LASIX ) 80 MG tablet [id number]  Take 1 tablet (80 mg total) by mouth daily. Mona Vinie BROCKS, MD  Active Self, Pharmacy Records  glucose blood (ACCU-CHEK GUIDE TEST) test strip [id number]  Use as instructed [name], MD  Active Self, Pharmacy Records  Insulin  Lispro Prot & Lispro (HUMALOG  MIX 75/25 KWIKPEN) (75-25) 100 UNIT/ML Kary [id number]  Inject 18 Units into the skin in the morning and at bedtime. [name], MD  Active Self, Pharmacy Records  metFORMIN  (GLUCOPHAGE ) 500 MG tablet [id number]  Take 1 tablet (500 mg total) by mouth 2 (two) times daily with a meal. Gonfa, Taye T, MD  Active   metoprolol  succinate (TOPROL -XL) 100 MG 24 hr tablet [id number]  Take 1 tablet (100 mg total) by mouth daily. [name], MD  Active Self, Pharmacy Records  OVER THE COUNTER MEDICATION [id number]  Take 1 tablet by mouth in the morning and at bedtime. [facility] - Restore multivitamin [provider]  Active Pharmacy Records, Self  spironolactone  (ALDACTONE ) 25 MG tablet [id number]  TAKE 1 TABLET BY MOUTH DAILY Hilty, Vinie BROCKS, MD  Active Self, Pharmacy Records  tamsulosin  (FLOMAX ) 0.4 MG CAPS capsule [id number]  Take 1 capsule (0.4 mg total) by mouth daily. [name], MD  Active Self, Pharmacy Records           Recommendation:   PCP Follow-up: as scheduled 06/16/24 Specialty provider follow-up: cardiology as scheduled 06/16/24; urology provider as scheduled 06/21/24 (per patient approximate date)  Follow Up Plan:   Telephone follow-up in 1 week- as scheduled 06/20/24  Pls call/ message for questions,  Tanith Dagostino Mckinney Mayzee Reichenbach, RN, BSN, CCRN Alumnus RN Care Manager  Transitions of Care  [facility]  [facility] [phone]: direct office

## 2024-06-15 NOTE — Progress Notes (Addendum)
 "  Acute Office Visit  Subjective:     Patient ID: Christopher [name], male    DOB: 20-Feb-1951, 73 y.o.   MRN: [id number]  No chief complaint on file.   HPI  Discussed the use of AI scribe software for clinical note transcription with the patient, who gave verbal consent to proceed.  HFU admission to [facility] 05/31/24-06/08/24 TOC call 06/09/24 History of Present Illness Christopher [name] is a 73 year old male with anemia and recent kidney stent placement who presents for a hospital follow-up visit.  Constitutional symptoms - Fatigue and increased pallor since recent hospitalization  Hemodynamic changes - Lower blood pressure and blood sugars at home compared to baseline  Renal and urologic symptoms - Chronic kidney disease with recent surgery on 06/03/24 for ureteral stent placement - Pink urine indicating small amounts of blood loss - Recent labs drawn, results pending  Cardiac symptoms and arrhythmia - No current palpitations - Premature ventricular contractions (PVCs) earlier today - Continues amiodarone  therapy  Anticoagulation therapy - Recently started Eliquis   Pulmonary clearance - Uses a lung clearance device 4 to 6 times daily to clear fluid from lungs  Care coordination - Cardiologist and urologist have conflicting treatment plans     ROS Per HPI      Objective:    BP 112/68 (BP Location: Left Arm, Patient Position: Sitting)   Pulse 69   Temp 97.9 F (36.6 C) (Temporal)   Ht 5' 10 (1.778 m)   Wt 216 lb (98 kg)   BMI 30.99 kg/m    Physical Exam Vitals and nursing note reviewed.  Constitutional:      General: He is not in acute distress.    Comments: Chronically ill appearing, appears fatigued  HENT:     Head: Normocephalic and atraumatic.     Right Ear: External ear normal.     Left Ear: External ear normal.     Nose: Nose normal.     Mouth/Throat:     Mouth: Mucous membranes are moist.     Pharynx: Oropharynx is clear.  Eyes:      Extraocular Movements: Extraocular movements intact.  Cardiovascular:     Rate and Rhythm: Normal rate and regular rhythm.     Pulses: Normal pulses.     Heart sounds: Murmur heard.  Pulmonary:     Effort: Pulmonary effort is normal. No respiratory distress.     Breath sounds: Normal breath sounds. No wheezing, rhonchi or rales.  Musculoskeletal:     Cervical back: Normal range of motion.     Right lower leg: No edema.     Left lower leg: No edema.     Comments: In w/c for visit  Lymphadenopathy:     Cervical: No cervical adenopathy.  Skin:    General: Skin is warm and dry.     Coloration: Skin is pale.  Neurological:     General: No focal deficit present.     Mental Status: He is alert and oriented to person, place, and time.  Psychiatric:        Mood and Affect: Mood normal.        Behavior: Behavior normal.     Results for orders placed or performed in visit on 06/16/24  Comp Met (CMET)  Result Value Ref Range   Sodium 138 135 - 145 mEq/L   Potassium 4.0 3.5 - 5.1 mEq/L   Chloride 100 96 - 112 mEq/L   CO2 29 19 - 32 mEq/L  Glucose, Bld 133 (H) 70 - 99 mg/dL   BUN 30 (H) 6 - 23 mg/dL   Creatinine, Ser 7.32 (H) 0.40 - 1.50 mg/dL   Total Bilirubin 0.7 0.2 - 1.2 mg/dL   Alkaline Phosphatase 57 39 - 117 U/L   AST 19 0 - 37 U/L   ALT 13 0 - 53 U/L   Total Protein 6.6 6.0 - 8.3 g/dL   Albumin  3.6 3.5 - 5.2 g/dL   GFR 77.22 (L) >39.99 mL/min   Calcium  9.5 8.4 - 10.5 mg/dL  CBC w/Diff  Result Value Ref Range   WBC 6.6 4.0 - 10.5 K/uL   RBC 3.80 (L) 4.22 - 5.81 Mil/uL   Hemoglobin 11.4 (L) 13.0 - 17.0 g/dL   HCT 65.6 (L) 60.9 - 47.9 %   MCV 90.1 78.0 - 100.0 fl   MCHC 33.3 30.0 - 36.0 g/dL   RDW 84.4 88.4 - 84.4 %   Platelets 209.0 150.0 - 400.0 K/uL   Neutrophils Relative % 74.2 43.0 - 77.0 %   Lymphocytes Relative 15.3 12.0 - 46.0 %   Monocytes Relative 6.9 3.0 - 12.0 %   Eosinophils Relative 3.1 0.0 - 5.0 %   Basophils Relative 0.5 0.0 - 3.0 %   Neutro Abs 4.9  1.4 - 7.7 K/uL   Lymphs Abs 1.0 0.7 - 4.0 K/uL   Monocytes Absolute 0.5 0.1 - 1.0 K/uL   Eosinophils Absolute 0.2 0.0 - 0.7 K/uL   Basophils Absolute 0.0 0.0 - 0.1 K/uL  Results for orders placed or performed in visit on 06/16/24  Basic metabolic panel with GFR  Result Value Ref Range   Glucose 86 70 - 99 mg/dL   BUN 27 8 - 27 mg/dL   Creatinine, Ser 7.37 (H) 0.76 - 1.27 mg/dL   eGFR 25 (L) >40 fO/fpw/8.26   BUN/Creatinine Ratio 10 10 - 24   Sodium 139 134 - 144 mmol/L   Potassium 4.0 3.5 - 5.2 mmol/L   Chloride 100 96 - 106 mmol/L   CO2 25 20 - 29 mmol/L   Calcium  9.5 8.6 - 10.2 mg/dL  CBC  Result Value Ref Range   WBC 7.0 3.4 - 10.8 x10E3/uL   RBC 3.76 (L) 4.14 - 5.80 x10E6/uL   Hemoglobin 11.4 (L) 13.0 - 17.7 g/dL   Hematocrit 65.1 (L) 62.4 - 51.0 %   MCV 93 79 - 97 fL   MCH 30.3 26.6 - 33.0 pg   MCHC 32.8 31.5 - 35.7 g/dL   RDW 85.7 88.3 - 84.5 %   Platelets 259 150 - 450 x10E3/uL        Assessment & Plan:   Assessment and Plan Assessment & Plan Blood loss anemia Suspected due to fatigue, pallor, and hypotension. Possible blood loss from kidney stent placement. Awaiting lab results. - Called [facility] for preliminary hemoglobin results, unable to give result  - STAT CBC ordered - Stopped Eliquis  and Plavix  pending cardiologist's advice. 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 Ongoing coordination with cardiologist and urologist. - Continue coordination between cardiologist and urologist.  Paroxysmal A fib, Hx mitral valve repair Recent PVCs noted. On amiodarone . Cardioversion not recommended due to anemia and anticoagulation therapy. - Continue amiodarone . - Hold cardioversion until anemia is addressed. - chronic     Orders  Placed This Encounter   Procedures   CBC w/Diff    Standing Status:   Future    Number of Occurrences:   1    Expiration Date:   06/16/2025   Comp Met (CMET)    Standing Status:   Future    Number of Occurrences:   1    Expiration Date:   06/16/2025     No orders of the defined types were placed in this encounter.   Return for as needed, with PCP as scheduled.  Corean LITTIE Ku, FNP  "

## 2024-06-16 ENCOUNTER — Ambulatory Visit: Attending: Internal Medicine | Admitting: Internal Medicine

## 2024-06-16 ENCOUNTER — Ambulatory Visit: Payer: Self-pay | Admitting: Family Medicine

## 2024-06-16 ENCOUNTER — Encounter: Payer: Self-pay | Admitting: Family Medicine

## 2024-06-16 ENCOUNTER — Encounter: Payer: Self-pay | Admitting: Internal Medicine

## 2024-06-16 ENCOUNTER — Ambulatory Visit: Admitting: Family Medicine

## 2024-06-16 VITALS — BP 96/42 | HR 45 | Ht 70.0 in | Wt 230.0 lb

## 2024-06-16 VITALS — BP 112/68 | HR 69 | Temp 97.9°F | Ht 70.0 in | Wt 216.0 lb

## 2024-06-16 DIAGNOSIS — R310 Gross hematuria: Secondary | ICD-10-CM | POA: Diagnosis not present

## 2024-06-16 DIAGNOSIS — D649 Anemia, unspecified: Secondary | ICD-10-CM | POA: Diagnosis not present

## 2024-06-16 DIAGNOSIS — I493 Ventricular premature depolarization: Secondary | ICD-10-CM

## 2024-06-16 DIAGNOSIS — R319 Hematuria, unspecified: Secondary | ICD-10-CM | POA: Diagnosis not present

## 2024-06-16 DIAGNOSIS — I959 Hypotension, unspecified: Secondary | ICD-10-CM | POA: Diagnosis not present

## 2024-06-16 DIAGNOSIS — I4819 Other persistent atrial fibrillation: Secondary | ICD-10-CM | POA: Diagnosis not present

## 2024-06-16 DIAGNOSIS — N1832 Chronic kidney disease, stage 3b: Secondary | ICD-10-CM | POA: Diagnosis not present

## 2024-06-16 DIAGNOSIS — I5021 Acute systolic (congestive) heart failure: Secondary | ICD-10-CM | POA: Diagnosis not present

## 2024-06-16 DIAGNOSIS — D500 Iron deficiency anemia secondary to blood loss (chronic): Secondary | ICD-10-CM

## 2024-06-16 DIAGNOSIS — Z9889 Other specified postprocedural states: Secondary | ICD-10-CM | POA: Diagnosis not present

## 2024-06-16 DIAGNOSIS — N1330 Unspecified hydronephrosis: Secondary | ICD-10-CM | POA: Diagnosis not present

## 2024-06-16 DIAGNOSIS — I480 Paroxysmal atrial fibrillation: Secondary | ICD-10-CM | POA: Diagnosis not present

## 2024-06-16 DIAGNOSIS — I5023 Acute on chronic systolic (congestive) heart failure: Secondary | ICD-10-CM

## 2024-06-16 DIAGNOSIS — R5383 Other fatigue: Secondary | ICD-10-CM

## 2024-06-16 DIAGNOSIS — I428 Other cardiomyopathies: Secondary | ICD-10-CM

## 2024-06-16 LAB — COMPREHENSIVE METABOLIC PANEL WITH GFR
ALT: 13 U/L (ref 0–53)
AST: 19 U/L (ref 0–37)
Albumin: 3.6 g/dL (ref 3.5–5.2)
Alkaline Phosphatase: 57 U/L (ref 39–117)
BUN: 30 mg/dL — ABNORMAL HIGH (ref 6–23)
CO2: 29 meq/L (ref 19–32)
Calcium: 9.5 mg/dL (ref 8.4–10.5)
Chloride: 100 meq/L (ref 96–112)
Creatinine, Ser: 2.67 mg/dL — ABNORMAL HIGH (ref 0.40–1.50)
GFR: 22.77 mL/min — ABNORMAL LOW (ref >60.00)
Glucose, Bld: 133 mg/dL — ABNORMAL HIGH (ref 70–99)
Potassium: 4 meq/L (ref 3.5–5.1)
Sodium: 138 meq/L (ref 135–145)
Total Bilirubin: 0.7 mg/dL (ref 0.2–1.2)
Total Protein: 6.6 g/dL (ref 6.0–8.3)

## 2024-06-16 LAB — CBC WITH DIFFERENTIAL/PLATELET
Basophils Absolute: 0 K/uL (ref 0.0–0.1)
Basophils Relative: 0.5 % (ref 0.0–3.0)
Eosinophils Absolute: 0.2 K/uL (ref 0.0–0.7)
Eosinophils Relative: 3.1 % (ref 0.0–5.0)
HCT: 34.3 % — ABNORMAL LOW (ref 39.0–52.0)
Hemoglobin: 11.4 g/dL — ABNORMAL LOW (ref 13.0–17.0)
Lymphocytes Relative: 15.3 % (ref 12.0–46.0)
Lymphs Abs: 1 K/uL (ref 0.7–4.0)
MCHC: 33.3 g/dL (ref 30.0–36.0)
MCV: 90.1 fl (ref 78.0–100.0)
Monocytes Absolute: 0.5 K/uL (ref 0.1–1.0)
Monocytes Relative: 6.9 % (ref 3.0–12.0)
Neutro Abs: 4.9 K/uL (ref 1.4–7.7)
Neutrophils Relative %: 74.2 % (ref 43.0–77.0)
Platelets: 209 K/uL (ref 150.0–400.0)
RBC: 3.8 Mil/uL — ABNORMAL LOW (ref 4.22–5.81)
RDW: 15.5 % (ref 11.5–15.5)
WBC: 6.6 K/uL (ref 4.0–10.5)

## 2024-06-16 NOTE — Progress Notes (Signed)
 OFFICE NOTE  Chief Complaint:  Follow-up hospitalization  Primary Care Physician: [name], MD  HPI:  Christopher [name] is a 73 y.o. 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  Subsequently he had cardiac arrest and had a single-chamber ICD placed in 2018 due to nonischemic cardiomyopathy.  He has had a history of atrial fibrillation, hypertension dyslipidemia and sleep apnea.  He is followed by Dr. Kelsie for his ICD.  At one point his LVEF had improved up to 55 to 60% on medical therapy however this past summer he had a pontine stroke associated diplopia.  Repeat echocardiogram showed a newly reduced EF 30 to 35% with anterior apical and inferoapical wall motion abnormality suggestive of Takatsubo versus LAD territory ischemia.  Given recent stroke ischemic evaluation was not performed.  He had not been complaining of chest pain.  Subsequently followed up with Hao Meng, PA-C, who has been increasing his heart failure medicine regimen, titrating his Entresto  up to the highest dose and adding Farxiga  in addition to spironolactone , beta-blocker and other heart failure therapies.  Repeat echocardiogram was performed in November which showed improvement in LVEF up to 40 to 45% however his EF is not normalized.  He denies any anginal symptoms but does get some mild shortness of breath with exertion.  12/29/2021  Christopher [name] returns today for follow-up.  Please report his LVEF has improved further slightly up to 45 to 50%.  The mitral valve gradients suggest some mild narrowing of the mitral valve annuloplasty without severe stenosis.  He did enroll in a clinical research trial for elevated triglycerides.  He was given a coronary CT scan as part of that study which did result in being abnormal suggesting a positive FFR and possible occlusion  proximal to the known RCA stent.  Despite this finding he is completely asymptomatic.  He denies chest pain or worsening shortness of breath.  Since this study was not performed for symptoms of angina, I am less inclined to need to follow this up.  He is also asking today about Viagra .  06/25/2022  Christopher [name] is seen today in follow-up.  He is maintained in the clinical research trial to lower triglycerides.  He seems to have had no issues with that.  Continues to have remote defibrillator checks which show no issues.  In June showed stable LVEF at 45 to 50% with a stable mitral valve gradient.  He did have a CT with FFR at that time as part of his clinical research trial.  This suggested some obstructive CAD proximal to the RCA stent however he has been asymptomatic.  Since this was only performed as part of the clinical research trial and he was asymptomatic we did not pursue any therapy.  It was felt that the FFR may be spurious because of the stent.  He reports he still does not have any anginal symptoms.  04/30/2023  Christopher [name] returns today for follow-up.  He has completed the clinical research trial regarding high triglycerides.  He did have some subsequent labs which showed marked reduction in triglycerides which were recently normal at 75 with an LDL of 60.  Unfortunately he is no longer on therapy.  As per the research trial as above he had a CT scan which showed probable proximal occlusion of the RCA stent however there are collaterals  that he is asymptomatic without chest pain.  Blood pressure was normal today.  03/03/2024  Christopher [name] returns today for follow-up of heart failure.  Earlier this year he had a repeat echo which showed a decline in LVEF down to 30 to 35%.  Subsequently was sent for left heart catheterization which showed no significant change in his coronaries with mild nonobstructive coronary disease.  LVEDP was 14 mmHg.  He had a subsequent follow-up with Dr. Inocencio.  He was  found to have some frequent PVCs.  He underwent a 2-week monitor which showed a burden of PVCs of 14%.  He was advised to start mexiletine 250 mg twice a day with the thought that this might be causing a PVC related cardiomyopathy.  He reports he is feeling fairly well although had a fall the other day when coming out of a bar.  He said he had only had 1 drink and did not lose consciousness.  He does have a lot of scabbing on his right knee and right shin.  He had some significant worsening of his renal function earlier this year.  A bit repeat metabolic profile was ordered but never obtained.  I noted that he is on too high of a dose of fenofibrate  given his decreased GFR and will need to reduce that.  06/16/2024  Christopher [name] is seen today for follow-up.  He was recently hospitalized between November 26 and June 08, 2024.  He presented with several weeks of malaise, fatigue, nausea and cough.  He felt that this might be due to mexiletine which was recently started for frequent PVCs.  This was discontinued by cardiac EP and he transitioned to amiodarone .  In the ER he was found to be in acute renal failure with a creatinine of 3.1.  Chest x-ray showed evidence of volume overload.  He was found on CT to have an incidental 9 mm stone in the right mid ureter.  Urology was consulted and they placed a right ureteral stent.  There is plan for stent removal procedure on December 24.  He was found to be in atrial fibrillation which was persistent during this hospitalization.  I continued him on amiodarone  and he was diuresed.  A right heart catheter was performed which showed mild pulmonary hypertension and mildly reduced cardiac output with cardiac output of 5.01 and cardiac index of 2.23.  He had been noticed to have some trace hematuria.  He was also noted to be hypotensive during that admission.  Entresto  was discontinued.  Since discharge he reports over the past 3 days he has had more significant hematuria.   Today in the office he appears pale and is again hypotensive.  Blood pressure was 96/42.  He is on Plavix  and Eliquis  which was started because of plans for possible cardioversion.  Overall he says he feels unwell.  PMHx:  Past Medical History:  Diagnosis Date   Arthritis    Atrial fibrillation (HCC)    post op, intol of anticoag   Cardiac arrest (HCC)    a. s/p MDT single chamber ICD; 11-10-18- pt denies having a heart attack   Cataract    CHF (congestive heart failure) (HCC)    Chronic kidney disease    kidney stone   Coronary artery disease    a. 2/7 Cath: LM nl, LAD min irregs, LCX min irregs, RI 40, RCA 54m, EF 55-60% basal to mid inf HK, 3-4+ MR;  b. 08/25/2012 PCI of RCA with 4.0x15 Vision BMS  Diabetes mellitus without complication (HCC)    GERD (gastroesophageal reflux disease)    hx   Heart murmur    Hyperlipidemia    on statin   Hypertension    Myocardial infarction (HCC)    PONV (postoperative nausea and vomiting)    S/P mitral valve repair 09/27/2012   Complex valvuloplasty including triangular resection of flail posterior leaflet with 30 mm Sorin Memo 3D ring annuloplasty via right mini thoracotomy approach   seasonal allergies 09/27/2008   Severe mitral regurgitation    a. Mitral valve prolapse with flail segment of posterior leaflet and severe MR by TEE, remote h/o bacterial endocarditis    Sleep apnea    NPSG 01/21/06- AHI 40.7/hr cpap   Stroke [facility])    summer 2022- one messed up vision, the other balance   Subacute bacterial endocarditis 03/22/2008   Strep viridans   Ventricular fibrillation (HCC) 11/07/2019   appropriate shock (36J) for VF delivered    Past Surgical History:  Procedure Laterality Date   AMPUTATION  07/28/2012   Procedure: AMPUTATION DIGIT;  Surgeon: Deward DELENA Schwartz, MD;  Location: [facility] ORS;  Service: Orthopedics;  Laterality: Right;  2nd toe   CARDIAC CATHETERIZATION     COLONOSCOPY     CYSTOSCOPY W/ URETERAL STENT PLACEMENT Right 06/03/2024    Procedure: CYSTOSCOPY, WITH RETROGRADE PYELOGRAM AND URETERAL STENT INSERTION;  Surgeon: Carolee Sherwood JONETTA DOUGLAS, MD;  Location: [facility] OR;  Service: Urology;  Laterality: Right;   EP IMPLANTABLE DEVICE N/A 05/01/2016   Procedure: Loop Recorder Removal;  Surgeon: Lynwood Rakers, MD;  Location: [facility] INVASIVE CV LAB;  Service: Cardiovascular;  Laterality: N/A;   FOOT SURGERY     BILATERAL TOES   ICD IMPLANT N/A 05/07/2017    Medtronic Visia AF MRI VR SureScan implanted by Dr Inocencio following VF arrest   Implantable loop recorder placement  03/31/2013   MDT Linq implanted by Dr Rakers to evaluate for further afib   INTRAOPERATIVE TRANSESOPHAGEAL ECHOCARDIOGRAM N/A 09/27/2012   Procedure: INTRAOPERATIVE TRANSESOPHAGEAL ECHOCARDIOGRAM;  Surgeon: Sudie VEAR Laine, MD;  Location: [facility] OR;  Service: Open Heart Surgery;  Laterality: N/A;   LEFT AND RIGHT HEART CATHETERIZATION WITH CORONARY ANGIOGRAM N/A 08/12/2012   Procedure: LEFT AND RIGHT HEART CATHETERIZATION WITH CORONARY ANGIOGRAM;  Surgeon: Ezra GORMAN Shuck, MD;  Location: [facility] CATH LAB;  Service: Cardiovascular;  Laterality: N/A;   LEFT HEART CATH AND CORONARY ANGIOGRAPHY N/A 05/06/2017   Procedure: LEFT HEART CATH AND CORONARY ANGIOGRAPHY;  Surgeon: Anner Alm ORN, MD;  Location: [facility] INVASIVE CV LAB;  Service: Cardiovascular;  Laterality: N/A;   LEFT HEART CATH AND CORONARY ANGIOGRAPHY N/A 08/12/2023   Procedure: LEFT HEART CATH AND CORONARY ANGIOGRAPHY;  Surgeon: Jordan, Peter M, MD;  Location: [facility] INVASIVE CV LAB;  Service: Cardiovascular;  Laterality: N/A;   LOOP RECORDER IMPLANT N/A 03/31/2013   Procedure: LOOP RECORDER IMPLANT;  Surgeon: Lynwood JONETTA Rakers, MD;  Location: [facility] CATH LAB;  Service: Cardiovascular;  Laterality: N/A;   MITRAL VALVE REPAIR Right 09/27/2012   Procedure: MINIMALLY INVASIVE MITRAL VALVE REPAIR (MVR);  Surgeon: Sudie VEAR Laine, MD;  Location: [facility] OR;  Service: Open Heart Surgery;  Laterality: Right;  Ultrasound guided   PERCUTANEOUS  CORONARY STENT INTERVENTION (PCI-S) N/A 08/25/2012   Procedure: PERCUTANEOUS CORONARY STENT INTERVENTION (PCI-S);  Surgeon: Ozell Fell, MD;  Location: [facility] CATH LAB;  Service: Cardiovascular;  Laterality: N/A;   POLYPECTOMY     RIGHT HEART CATH N/A 06/05/2024   Procedure: RIGHT HEART CATH;  Surgeon:  Anner Alm ORN, MD;  Location: [facility] INVASIVE CV LAB;  Service: Cardiovascular;  Laterality: N/A;   SEPTOPLASTY     Dr. Floy   TEE WITHOUT CARDIOVERSION N/A 08/12/2012   Procedure: TRANSESOPHAGEAL ECHOCARDIOGRAM (TEE);  Surgeon: Ezra GORMAN Shuck, MD;  Location: [facility] ENDOSCOPY;  Service: Cardiovascular;  Laterality: N/A;   TONSILLECTOMY     UVULOPALATOPHARYNGOPLASTY      FAMHx:  Family History  Problem Relation Age of Onset   Diabetes Mother    Stroke Mother    Heart disease Father    Colon cancer Father 7   Diabetes Father    Renal cancer Father    Cancer Father    Sudden death Brother    Heart attack Brother    Esophageal cancer Neg Hx    Rectal cancer Neg Hx    Stomach cancer Neg Hx    Colon polyps Neg Hx     SOCHx:   reports that he quit smoking about 45 years ago. His smoking use included cigarettes. He started smoking about 50 years ago. He has a 12.5 pack-year smoking history. He has never used smokeless tobacco. He reports current alcohol use. He reports that he does not use drugs.  ALLERGIES:  Allergies  Allergen Reactions   Codeine Other (See Comments)    Causes bad constipation   Dust Mite Extract Cough   Pollen Extract Cough    ROS: Pertinent items noted in HPI and remainder of comprehensive ROS otherwise negative.  HOME MEDS: Current Outpatient Medications on File Prior to Visit  Medication Sig Dispense Refill   Accu-Chek Softclix Lancets lancets 1 each by Other route See admin instructions. Use TID 100 each 12   acetaminophen  (TYLENOL ) 500 MG tablet Take 1,000 mg by mouth every 6 (six) hours as needed for mild pain (pain score 1-3) or headache.      amiodarone  (PACERONE ) 200 MG tablet Take 1 tablet (200 mg total) by mouth 2 (two) times daily for 30 days, THEN 1 tablet (200 mg total) daily. 110 tablet 3   apixaban  (ELIQUIS ) 5 MG TABS tablet Take 1 tablet (5 mg total) by mouth 2 (two) times daily. 180 tablet 0   atorvastatin  (LIPITOR) 40 MG tablet Take 1 tablet (40 mg total) by mouth daily. 100 tablet 3   cetirizine (ZYRTEC) 10 MG tablet Take 10 mg by mouth daily.     cholecalciferol  (VITAMIN D ) 25 MCG (1000 UNIT) tablet Take 1,000 Units by mouth daily.     Choline Fenofibrate  (FENOFIBRIC ACID ) 45 MG CPDR Take 1 tablet by mouth daily. 90 capsule 3   clopidogrel  (PLAVIX ) 75 MG tablet Take 1 tablet (75 mg total) by mouth daily. 100 tablet 3   dapagliflozin  propanediol (FARXIGA ) 10 MG TABS tablet Take 1 tablet (10 mg total) by mouth daily. 100 tablet 3   fluticasone  (FLONASE ) 50 MCG/ACT nasal spray Place 2 sprays into both nostrils daily. 64 g 3   furosemide  (LASIX ) 80 MG tablet Take 1 tablet (80 mg total) by mouth daily. 30 tablet 3   glucose blood (ACCU-CHEK GUIDE TEST) test strip Use as instructed 100 each 12   Insulin  Lispro Prot & Lispro (HUMALOG  MIX 75/25 KWIKPEN) (75-25) 100 UNIT/ML Kwikpen Inject 18 Units into the skin in the morning and at bedtime. 45 mL 3   metFORMIN  (GLUCOPHAGE ) 500 MG tablet Take 1 tablet (500 mg total) by mouth 2 (two) times daily with a meal. 180 tablet 0   metoprolol  succinate (TOPROL -XL) 100 MG 24 hr tablet  Take 1 tablet (100 mg total) by mouth daily. 100 tablet 3   OVER THE COUNTER MEDICATION Take 1 tablet by mouth in the morning and at bedtime. [facility] - Restore multivitamin     spironolactone  (ALDACTONE ) 25 MG tablet TAKE 1 TABLET BY MOUTH DAILY 100 tablet 2   tamsulosin  (FLOMAX ) 0.4 MG CAPS capsule Take 1 capsule (0.4 mg total) by mouth daily. 100 capsule 3   No current facility-administered medications on file prior to visit.    LABS/IMAGING: No results found for this or any previous visit (from the  past 48 hours). No results found.  LIPID PANEL:    Component Value Date/Time   CHOL 163 11/30/2023 1328   CHOL 120 12/23/2021 1502   TRIG 273.0 (H) 11/30/2023 1328   HDL 42.90 11/30/2023 1328   HDL 38 (L) 12/23/2021 1502   CHOLHDL 4 11/30/2023 1328   VLDL 54.6 (H) 11/30/2023 1328   LDLCALC 66 11/30/2023 1328   LDLCALC 60 12/23/2021 1502   LDLDIRECT 52.0 07/18/2019 1454     WEIGHTS: Wt Readings from Last 3 Encounters:  06/16/24 230 lb (104.3 kg)  06/07/24 220 lb 0.3 oz (99.8 kg)  05/30/24 219 lb 3.2 oz (99.4 kg)    VITALS: BP (!) 96/42   Pulse (!) 45   Ht 5' 10 (1.778 m)   Wt 230 lb (104.3 kg)   SpO2 96%   BMI 33.00 kg/m   EXAM: General appearance: alert, fatigued, and pale Neck: no carotid bruit, no JVD, and thyroid  not enlarged, symmetric, no tenderness/mass/nodules Lungs: clear to auscultation bilaterally Heart: irregularly irregular rhythm Abdomen: soft, non-tender; bowel sounds normal; no masses,  no organomegaly Extremities: extremities normal, atraumatic, no cyanosis or edema Pulses: 2+ and symmetric Skin: Pale, warm, dry, scabbing over the right knee and anterior shin Neurologic: Grossly normal Psych: Appears fatigued, frustrated  EKG: EKG Interpretation Date/Time:  Friday June 16 2024 11:16:42 EST Ventricular Rate:  78 PR Interval:    QRS Duration:  152 QT Interval:  480 QTC Calculation: 547 R Axis:   -55  Text Interpretation: Atrial flutter with variable A-V block with premature ventricular or aberrantly conducted complexes Right bundle branch block Left anterior fascicular block Bifascicular block When compared with ECG of 31-May-2024 20:19, He appears to be in persistent atrial fibrillation/flutter Confirmed by Mona Kent [phone]) on 06/16/2024 12:10:26 PM    ASSESSMENT: Persistent atrial fibrillation/flutter Right renal stone status post ureteral stent to Intermittent GU bleeding CAD status post PCI to the RCA in 2014 Recent acute on  chronic systolic heart failure, LVEF 30 to 35% (08/2023), NYHA class II symptoms Coronary CTA suggested possible obstruction proximal to the right coronary artery stent (12/2021)-asymptomatic -cardiac catheter revealed mild nonobstructive coronary disease (08/2023) Cardiac arrest in 2018 due to nonischemic cardiomyopathy status post AICD History of subacute bacterial endocarditis status post mitral valve repair History of stroke in 02/2021 Hypertension Dyslipidemia OSA on CPAP Erectile dysfunction  PLAN: 1.   Christopher [name] was just hospitalized with acute on chronic systolic heart failure.  He improved with diuresis but also was noted to have persistent atrial fibrillation or flutter.  He was started on Eliquis  and continued on Plavix  but has had some recent GU bleeding.  He is scheduled to have ureteral stent procedure on December 24.  Today he appears very pale and weak and is hypotensive.  We have kept him off of his Entresto .  I advise stopping spironolactone .  In addition we will get stat labs including a  CBC and metabolic profile today.  If he is anemic then I would likely recommend him to stop both Plavix  and Eliquis .  He should have proceed with any urologic procedure that he needs in a couple weeks and if there is hopefully no evidence of continued bleeding afterwards we may consider restarting Eliquis  for his A-fib.  At this point I think it is unsafe to proceed with cardioversion with concerns for bleeding on anticoagulation or anemia.  Plan follow-up with me in about 3 weeks.  Case discussed personally with Dr. Carolee over the telephone.  He does have follow-up with his primary care provider this afternoon.  Vinie KYM Maxcy, MD, [facility], FNLA, FACP  [place]  [facility]  Medical Director of the Advanced Lipid Disorders &  Cardiovascular Risk Reduction Clinic Diplomate of the American Board of Clinical Lipidology Attending Cardiologist  Direct Dial: [phone]  Fax: [phone]   Website:  [email or web address] Vinie BROCKS Alaney Witter 06/16/2024, 12:10 PM

## 2024-06-16 NOTE — Patient Instructions (Addendum)
 Medication Instructions:   TAKE Amiodarone  200mg  daily   STOP spironolactone    Will contact you about other med changes, pending lab results  *If you need a refill on your cardiac medications before your next appointment, please call your pharmacy*  Lab Work:  CBC and BMET today on first floor   If you have labs (blood work) drawn today and your tests are completely normal, you will receive your results only by: MyChart Message (if you have MyChart) OR A paper copy in the mail If you have any lab test that is abnormal or we need to change your treatment, we will call you to review the results.   Follow-Up: At [facility], you and your health needs are our priority.  As part of our continuing mission to provide you with exceptional heart care, our providers are all part of one team.  This team includes your primary Cardiologist (physician) and Advanced Practice Providers or APPs (Physician Assistants and Nurse Practitioners) who all work together to provide you with the care you need, when you need it.  Your next appointment:    January 5th with Dr. Mona  We recommend signing up for the patient portal called MyChart.  Sign up information is provided on this After Visit Summary.  MyChart is used to connect with patients for Virtual Visits (Telemedicine).  Patients are able to view lab/test results, encounter notes, upcoming appointments, etc.  Non-urgent messages can be sent to your provider as well.   To learn more about what you can do with MyChart, go to [email or web address].

## 2024-06-16 NOTE — Patient Instructions (Addendum)
 Continue current medication regimen.   Follow up with specialists as scheduled.   STOP Eliquis  and Plavix  for now until restarted by Dr Mona.   We are checking labs today, will be in contact with any results that require further attention  Follow up as needed here, with PCP as scheduled.   Follow up with cardiology as scheduled.

## 2024-06-17 LAB — CBC
Hematocrit: 34.8 % — ABNORMAL LOW (ref 37.5–51.0)
Hemoglobin: 11.4 g/dL — ABNORMAL LOW (ref 13.0–17.7)
MCH: 30.3 pg (ref 26.6–33.0)
MCHC: 32.8 g/dL (ref 31.5–35.7)
MCV: 93 fL (ref 79–97)
Platelets: 259 x10E3/uL (ref 150–450)
RBC: 3.76 x10E6/uL — ABNORMAL LOW (ref 4.14–5.80)
RDW: 14.2 % (ref 11.6–15.4)
WBC: 7 x10E3/uL (ref 3.4–10.8)

## 2024-06-17 LAB — BASIC METABOLIC PANEL WITH GFR
BUN/Creatinine Ratio: 10 (ref 10–24)
BUN: 27 mg/dL (ref 8–27)
CO2: 25 mmol/L (ref 20–29)
Calcium: 9.5 mg/dL (ref 8.6–10.2)
Chloride: 100 mmol/L (ref 96–106)
Creatinine, Ser: 2.62 mg/dL — AB (ref 0.76–1.27)
Glucose: 86 mg/dL (ref 70–99)
Potassium: 4 mmol/L (ref 3.5–5.2)
Sodium: 139 mmol/L (ref 134–144)
eGFR: 25 mL/min/1.73 — AB (ref >59)

## 2024-06-18 ENCOUNTER — Ambulatory Visit (HOSPITAL_BASED_OUTPATIENT_CLINIC_OR_DEPARTMENT_OTHER): Payer: Self-pay | Admitting: Internal Medicine

## 2024-06-20 ENCOUNTER — Other Ambulatory Visit: Payer: Self-pay | Admitting: *Deleted

## 2024-06-20 ENCOUNTER — Encounter: Payer: Self-pay | Admitting: Cardiology

## 2024-06-20 NOTE — Transitions of Care (Post Inpatient/ED Visit) (Signed)
 Transition of Care week 3/ day # 12  Visit Note  06/20/2024  Name: Christopher [name] MRN: [id number]          DOB: 1951/01/12  Situation: Patient enrolled in [facility] 30-day program. Visit completed with patient by telephone.   HIPAA identifiers x 2 verified  Background:  Lives alone, limited social/ family support; independent in self-care activities; no assistive devices at baseline; drives self Needs education- reinforcement around self- management of multiple chronic health conditions; medications Recent hospitalization November 26- June 08, 2024 for AKI/ kidney stone; Acute on Chronic CHF (2) unplanned hospitalizations x last (12) months; (1) x last 6 months  Initial Transition Care Management Follow-up Telephone Call Discharge Date and Diagnosis: 06/08/24, AKI: (R) kidney stone with uretal stent placement; Acute on chronic CHF with cardiac catherization   Past Medical History:  Diagnosis Date   Arthritis    Atrial fibrillation (HCC)    post op, intol of anticoag   Cardiac arrest (HCC)    a. s/p MDT single chamber ICD; 11-10-18- pt denies having a heart attack   Cataract    CHF (congestive heart failure) (HCC)    Chronic kidney disease    kidney stone   Coronary artery disease    a. 2/7 Cath: LM nl, LAD min irregs, LCX min irregs, RI 40, RCA 46m, EF 55-60% basal to mid inf HK, 3-4+ MR;  b. 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  If you sent them a message, they didn't bring it up to me either.  My blood sugars are looking better, they are staying up above 100 for the most part.  I am still weak and I am taking the amiodarone  like Dr. Mona told me to.  I think I must have had the medicine mixed up when we talked a couple of weeks ago.  I have no idea what kind of procedure they will be doing on me 12/24; I just know it's something about my kidneys; still not checking my blood pressures or my weights at home and still not planning to start    Denies definitive clinical concerns and sounds to be in no distress throughout [facility] 30-day program outreach call today  Patient Reported Symptoms: Cognitive Cognitive Status: Normal speech and language skills, Alert and oriented to person, place, and time, Difficulties with attention and concentration, Insightful and able to interpret abstract concepts Cognitive/Intellectual Conditions  Management [RPT]: None reported or documented in medical history or problem list      Neurological Neurological Review of Symptoms: No symptoms reported    HEENT HEENT Symptoms Reported: No symptoms reported      Cardiovascular Cardiovascular Symptoms Reported: No symptoms reported Other Cardiovascular Symptoms: Continues to decline home blood pressure/ daily weight monitoring: I don't want any part of that  Again denies presence of lower extremity swelling Does patient have uncontrolled Hypertension?: No Cardiovascular Management Strategies: Routine screening,  Coping strategies, Medication therapy, Adequate rest  Respiratory Respiratory Symptoms Reported: No symptoms reported Other Respiratory Symptoms: I am breathing fine; sounds to be in no respiratory distress throughout [facility] call    Endocrine Endocrine Symptoms Reported: No symptoms reported Is patient diabetic?: Yes Is patient checking blood sugars at home?: Yes List most recent blood sugar readings, include date and time of day: My blood sugars have been a little better since the last time we talked- they are staying above 100 and was 106 yesterday and 122 today; I forgot to talk to the doctors about how they had been running low.  They didn't mention anything about blood sugars to me when I was there on 06/16/24 I am trying to eat regularly    Gastrointestinal Gastrointestinal Symptoms Reported: No symptoms reported Additional Gastrointestinal Details: Having normal and regular BM's- no more problems with consitipation; last BM was earlier today Gastrointestinal Management Strategies: Coping strategies    Genitourinary Genitourinary Symptoms Reported: No symptoms reported Additional Genitourinary Details: Peeing fine; urine is clear yellow and light, no problems; going to the pre-op  appointment tomorrow for the kidney; I have no idea what they are going to do on 06/28/24: I just know they are going to do some kind of procedure  Reviewed with patient by chart review that is appears the 06/28/24 scheduled procedure will be either a stent placement vs. removal: cardiology notes document procedure as a stent removal, but scheduling notes indicate stent placement    Integumentary Integumentary Symptoms Reported: No symptoms reported    Musculoskeletal Musculoskelatal Symptoms Reviewed: Weakness Additional Musculoskeletal Details: confirmed not currently requiring/ using assistive devices for ambulation on regular basis: I sometimes use the walker if I am going out of the house or to check  the mail  Confirmed no new/ recent falls post-hospital discharge Musculoskeletal Management Strategies: Routine screening, Medical device, Coping strategies   Fall risk Follow up: Falls prevention discussed, Education provided  Psychosocial           There were no vitals filed for this visit.    Medications Reviewed Today     Reviewed by Zhoey Blackstock M, RN (Registered Nurse) on 06/20/24 at 1618  Med List Status: <None>   Medication Order Taking? Sig Documenting Provider Last Dose Status Informant  Accu-Chek Softclix Lancets lancets [id number]  1 each by Other route See admin instructions. Use TID [name], MD  Active Self  acetaminophen  (TYLENOL ) 500 MG tablet [id number]  Take 1,000 mg by mouth every 6 (six) hours as needed for mild pain (pain score 1-3) or headache. [provider]  Active Self  amiodarone  (PACERONE ) 200 MG tablet [id number] Yes Take 1 tablet (200 mg total) by mouth 2 (two) times daily for 30 days, THEN 1 tablet (200 mg total) daily. Inocencio Soyla Lunger, MD  Active Self  apixaban  (ELIQUIS ) 5 MG TABS tablet [id number]  Take 1 tablet (5 mg total) by mouth 2 (two) times daily. Gonfa, Taye T, MD  Active Self  Med Note CLAUD MICHEAL DASEN   Fri Jun 16, 2024  3:33 PM) On hold per MD instruction  atorvastatin  (LIPITOR) 40 MG tablet [id number]  Take 1 tablet (40 mg total) by mouth daily. [name], MD  Active Self  cetirizine (ZYRTEC) 10 MG tablet [id number]  Take 10 mg by mouth daily. [provider]  Active Self  cholecalciferol  (VITAMIN D ) 25 MCG (1000 UNIT) tablet [id number]  Take 1,000 Units by mouth daily. [provider]  Active Self  Choline Fenofibrate  (FENOFIBRIC ACID ) 45 MG CPDR [id number]  Take 1 tablet by mouth daily. Mona Vinie BROCKS, MD  Active Self  clopidogrel  (PLAVIX ) 75 MG tablet [id number]  Take 1 tablet (75 mg total) by mouth daily. [name], MD  Active Self  dapagliflozin  propanediol  (FARXIGA ) 10 MG TABS tablet [id number]  Take 1 tablet (10 mg total) by mouth daily. [name], MD  Active Self  fluticasone  (FLONASE ) 50 MCG/ACT nasal spray [id number]  Place 2 sprays into both nostrils daily.  Patient taking differently: Place 2 sprays into both nostrils daily as needed for allergies.   [name], MD  Active Self  furosemide  (LASIX ) 80 MG tablet [id number]  Take 1 tablet (80 mg total) by mouth daily. Mona Vinie BROCKS, MD  Active Self  glucose blood (ACCU-CHEK GUIDE TEST) test strip [id number]  Use as instructed [name], MD  Active Self  Insulin  Lispro Prot & Lispro (HUMALOG  MIX 75/25 KWIKPEN) (75-25) 100 UNIT/ML Kary [id number]  Inject 18 Units into the skin in the morning and at bedtime. [name], MD  Active Self  metFORMIN  (GLUCOPHAGE ) 500 MG tablet [id number]  Take 1 tablet (500 mg total) by mouth 2 (two) times daily with a meal. Kathrin Mignon DASEN, MD  Active Self  metoprolol  succinate (TOPROL -XL) 100 MG 24 hr tablet [id number]  Take 1 tablet (100 mg total) by mouth daily. [name], MD  Active Self  naphazoline-pheniramine (NAPHCON-A) 0.025-0.3 % ophthalmic solution [id number]  Place 2 drops into both eyes in the morning. [provider]  Active Self  OVER THE COUNTER MEDICATION [id number]  Take 1 tablet by mouth in the morning and at bedtime. [facility] - Restore multivitamin [provider]  Active Self  spironolactone  (ALDACTONE ) 25 MG tablet [id number]  TAKE 1 TABLET BY MOUTH DAILY Hilty, Vinie BROCKS, MD  Active Self  tamsulosin  (FLOMAX ) 0.4 MG CAPS capsule [id number]  Take 1 capsule (0.4 mg total) by mouth daily. [name], MD  Active Self           Recommendation:   Specialty provider follow-up: preadmission testing 06/21/24 and urology procedure 06/28/24 Continue Current Plan of Care  Follow Up Plan:   Telephone follow-up in 1 week- as scheduled 06/26/24  I appreciate the opportunity  to participate in Steve's care,  Pls call/ message for questions,  Dallys Nowakowski Mckinney Shatia Sindoni, RN, BSN, CCRN Alumnus RN Care Manager  Transitions of Care  [facility] - [facility] [phone]: direct office

## 2024-06-20 NOTE — Patient Instructions (Signed)
 Visit Information  Thank you for taking time to visit with me today. Please don't hesitate to contact me if I can be of assistance to you before our next scheduled telephone appointment.  Our next appointment is by telephone on Monday 06/26/24 at 2:30 pm  Please call the care guide team at [phone] if you need to cancel or reschedule your appointment.   Following are the goals we discussed today:  Patient Self Care Activities:  Attend all scheduled provider appointments Call provider office for new concerns or questions  Participate in Transition of Care Program/Attend TOC scheduled calls Take medications as prescribed   use salt in moderation watch for swelling in feet, ankles and legs every day know when to call the doctor:for shortness of breath that is increased from your normal breathing; for increased swelling in your legs- ankles- feet or abdomen check blood sugar at prescribed times: once daily and when you have symptoms of low or high blood sugar take the blood sugar log to all doctor visits Continue pacing activity to avoid episodes of shortness of breath Continue monitoring and recording your blood sugars at home Use assistive devices as needed to prevent falls- walker / cane as needed Eat a heart healthy and low salt diet Please go over all of your medications and medication question/ concerns with your care providers (doctor) If you believe your condition is getting worse- contact your care providers (doctors) promptly- reaching out to your doctor early when you have concerns can prevent you from having to go to the hospital  If you are experiencing a Mental Health or Behavioral Health Crisis or need someone to talk to, please  call the Suicide and Crisis Lifeline: 988 call the [place]  National Suicide Prevention Lifeline: (913) 795-2807 or TTY: 702-256-7981 TTY (404)812-1503) to talk to a trained counselor call 1-800-273-TALK (toll free, 24 hour hotline) go to [facility] [address], [place] [phone]) call the [facility] Crisis Line: 6822047080 call 911   Care plan and visit instructions communicated with the patient verbally today. Patient agrees to receive a copy in MyChart. Active MyChart status and patient understanding of how to access instructions and care plan via MyChart confirmed with patient.     Delorise Hunkele Mckinney [place], RN, BSN, Media Planner  Transitions of Care  [facility] - [facility] [phone]: direct office

## 2024-06-20 NOTE — Patient Instructions (Signed)
 SURGICAL WAITING ROOM VISITATION Patients having surgery or a procedure may have no more than 2 support people in the waiting area - these visitors may rotate in the visitor waiting room.   If the patient needs to stay at the hospital during part of their recovery, the visitor guidelines for inpatient rooms apply.  PRE-OP  VISITATION  Pre-op  nurse will coordinate an appropriate time for 1 support person to accompany the patient in pre-op .  This support person may not rotate.  This visitor will be contacted when the time is appropriate for the visitor to come back in the pre-op  area.  To keep our patients, visitors and teammates safe and prevent the spread of respiratory illnesses over the next few months.  Temporary Visitor Restrictions  Children ages 63 and under will not be able to visit patients in [facility] under most circumstances. Visitation is not restricted outside of hospitals unless noted otherwise in the [facility] and Location Specific Visitation Guidelines at:       [email or web address]  Visitors with respiratory illnesses are discouraged from visiting and should remain at home. You are not required to quarantine at this time prior to your surgery. However, you must do this: Hand Hygiene often Do NOT share personal items Notify your provider if you are in close contact with someone who has COVID or you develop fever 100.4 or greater, new onset of sneezing, cough, sore throat, shortness of breath or body aches.  If you test positive for Covid or have been in contact with anyone that has tested positive in the last 10 days please notify you surgeon.    Your procedure is scheduled on:  Wednesday  06-28-24  Report to [facility] Main Entrance: [address] entrance where the [employer] is available.   Report to admitting at: 09:00 AM  Call this number if you have any questions or problems the morning of surgery [phone]  DO NOT EAT OR DRINK ANYTHING AFTER  MIDNIGHT THE NIGHT PRIOR TO YOUR SURGERY / PROCEDURE.   FOLLOW  ANY ADDITIONAL PRE OP INSTRUCTIONS YOU RECEIVED FROM YOUR SURGEON'S OFFICE!!!   Oral Hygiene is also important to reduce your risk of infection.        Remember - BRUSH YOUR TEETH THE MORNING OF SURGERY WITH YOUR REGULAR TOOTHPASTE  Do NOT smoke after Midnight the night before surgery.  METFORMIN - Day BEFORE surgery, take as usual. DO NOT TAKE METFORMIN  THE DAY OF YOUR SURGERY.   Dapagliflozin  (Farxiga )  Stop taking 72 hours before your surgery. Last dose will be taken on Saturday 06-24-24  Insulin  Lispro / Humalog  75-25- Inject 18 units am & hs:  Day BEFORE Surgery:  Take 100% (18 units) of usual morning dose and take 70% (12 units) at Bedtime.     DAY OF YOUR SURGERY: do not inject.   STOP TAKING all Vitamins, Herbs and supplements 1 week before your surgery.   Take ONLY these medicines the morning of surgery with A SIP OF WATER :  Amiodarone , Metoprolol , Tamsulosin , Cetirizine, Tylenol  if needed. You may use your  Eye drops and Flonase  nasal spray if needed.   DO NOT TAKE Spironolactone , Furosemide , on the morning of your surgery.   If You have been diagnosed with Sleep Apnea - Bring CPAP mask and tubing day of surgery. We will provide you with a CPAP machine on the day of your surgery.                   You may  not have any metal on your body including  jewelry, and body piercing  Do not wear  lotions, powders, cologne, or deodorant  Men may shave face and neck.  Contacts, Hearing Aids, dentures or bridgework may not be worn into surgery. DENTURES WILL BE REMOVED PRIOR TO SURGERY PLEASE DO NOT APPLY Poly grip OR ADHESIVES!!!  You may bring a small overnight bag with you on the day of surgery, only pack items that are not valuable. [facility] IS NOT RESPONSIBLE   FOR VALUABLES THAT ARE LOST OR STOLEN.   Patients discharged on the day of surgery will not be allowed to drive home.  Someone NEEDS to stay with you for  the first 24 hours after anesthesia.  Do not bring your home medications to the hospital. The Pharmacy will dispense medications listed on your medication list to you during your admission in the Hospital.  Special Instructions: Bring a copy of your healthcare power of attorney and living will documents the day of surgery, if you wish to have them scanned into your [facility] Records- EPIC  Please read over the following fact sheets you were given: IF YOU HAVE QUESTIONS ABOUT YOUR PRE-OP  INSTRUCTIONS, PLEASE CALL [phone].   Magnet - Preparing for Surgery      Before surgery, you can play an important role.  Because skin is not sterile, your skin needs to be as free of germs as possible.  You can reduce the number of germs on your skin by washing with CHG (chlorahexidine gluconate) soap before surgery.  CHG is an antiseptic cleaner which kills germs and bonds with the skin to continue killing germs even after washing. Please DO NOT use if you have an allergy to CHG or antibacterial soaps.  If your skin becomes reddened/irritated stop using the CHG and inform your nurse when you arrive at Short Stay. Do not shave (including legs and underarms) for at least 48 hours prior to the first CHG shower.  You may shave your face/neck.  Please follow these instructions carefully:  1.  Shower with CHG Soap the night before surgery ONLY (DO NOT USE THE CHG SOAP THE MORNING OF SURGERY).  2.  If you choose to wash your hair, wash your hair first as usual with your normal  shampoo.  3.  After you shampoo, rinse your hair and body thoroughly to remove the shampoo.                             4.  Use CHG as you would any other liquid soap.  You can apply chg directly to the skin and wash.  Gently with a scrungie or clean washcloth.  5.  Apply the CHG Soap to your body ONLY FROM THE NECK DOWN.   Do not use on face/ open                           Wound or open sores. Avoid contact with eyes, ears  mouth and genitals (private parts).                       Wash face,  Genitals (private parts) with your normal soap.             6.  Wash thoroughly, paying special attention to the area where your  surgery  will be performed.  7.  Thoroughly rinse your  body with warm water  from the neck down.  8.  DO NOT shower/wash with your normal soap after using and rinsing off the CHG Soap.                9.  Pat yourself dry with a clean towel.            10.  Wear clean pajamas.            11.  Place clean sheets on your bed the night of your first shower and do not  sleep with pets.  Day of Surgery : Do not apply any CHG, lotions/deodorants the morning of surgery.  Please wear clean clothes to the hospital/surgery center.   FAILURE TO FOLLOW THESE INSTRUCTIONS MAY RESULT IN THE CANCELLATION OF YOUR SURGERY  PATIENT SIGNATURE_________________________________  NURSE SIGNATURE__________________________________  ________________________________________________________________________

## 2024-06-20 NOTE — Progress Notes (Signed)
 The patient was identified using 2 approved identifiers. All issues noted in this document were discussed and addressed, [name] voiced understanding and agreement with all preoperative instructions. The patient was emailed the surgery instructions per his request.    Christopher [name]  The patient was instructed to call our Admitting Office [phone] or [phone]) to complete their Pre-surgical Interview.    Medication Recon: done 06-16-24  No change per pt.   COVID Vaccine received:  []  No [x]  Yes Date of any COVID positive Test in last 90 days: none  PCP - Almarie Cleveland, MD  Corean Ku, FNP  Cardiologist - Kenneth Chad Hilty, MD  EP- Soyla Norton, MD   Chest x-ray - 06-05-2024  1v Epic EKG -  06-16-24  Epic Stress Test -  ECHO - 05-29-2024  Epic Cardiac Cath - multiple  last was 06-05-24  RHC,  CT Coronary Calcium  score:   Pacemaker / ICD device []  No [x]  Yes  Medtronic VISIA MRI VR [id number] - [id number] , last remote check: 05-15-2024   Device orders requested.  Spinal Cord Stimulator:[]  No []  Yes       History of Sleep Apnea? []  No [x]  Yes   CPAP used?- [x]  No []  Yes    Medication on DOS: Amiodarone , Metoprolol , Tamsulosin , Cetirizine, Tylenol ,  Eye drops, Flonase  nasal spray,   Hold DOS: Spironolactone , Furosemide ,   Patient has: []  NO Hx DM   []  Pre-DM   []  DM1  [x]   DM2 Does the patient monitor blood sugar?   []  N/A   []  No [x]  Yes  Last A1c was:  5.9  on  06-01-24    Does patient have a [employer] or Dexcom? [x]  No []  Yes   Fasting Blood Sugar Ranges-  80-125  Checks Blood Sugar _1 times a day  Metformin -  hold DOS  Dapagliflozin  (Farxiga )  Hold 72 hours Insulin  Lispro / Humalog  75-25- Inject 18 units am & hs  Blood Thinner / Instructions:  Plavix , Eliquis  on hold already stopped 1 week ago Aspirin  Instructions: none  Activity level: Able to walk up 2 flights of stairs without becoming significantly short of breath or having chest  pain?   [x]    Yes   []  No,  would have: Patient can perform ADLs without assistance.  [x]   Yes    []  No  Comments: Labs were drawn 06-16-24 (CBC Diff, CMP,   Anesthesia review: CAD, CHF, Hx subacute bacterial endocarditis s/p MVR 2014, A.Fib,  HTN, DM2, CKD3b, Hx CVA - vision & balance, OSA- CPAP, GERD,  PONV,   Patient denies any S&S of respiratory illness or Covid - no shortness of breath, fever, cough or chest pain at PAT appointment.

## 2024-06-20 NOTE — Progress Notes (Signed)
 PERIOPERATIVE PRESCRIPTION FOR IMPLANTED CARDIAC DEVICE PROGRAMMING  Patient Information: Name:  Christopher [name]  DOB:  [birth year]  MRN:  [id number]    Planned Procedure:  Cystoscopy, ureteroscopy, Laser, stent etc  Surgeon: Dr. Sherwood Edison  Date of Procedure: 06-28-2024  Cautery will be used.  Position during surgery: Supine   Please send documentation back to:  [facility] (Fax# [phone])  Device Information:  Clinic EP Physician:  Soyla Norton, MD   Device Type:  Defibrillator Manufacturer and Phone #:  Medtronic: [id number] Pacemaker Dependent?:  No. Date of Last Device Check:  05/29/2024  Normal Device Function?:  Yes.    Electrophysiologist's Recommendations:  Have magnet available. Provide continuous ECG monitoring when magnet is used or reprogramming is to be performed.  Procedure may interfere with device function.  Magnet should be placed over device during procedure.  Per Device Clinic Standing Orders, Almarie ONEIDA Shutter, RN  4:02 PM 06/20/2024

## 2024-06-21 ENCOUNTER — Encounter (HOSPITAL_COMMUNITY): Admission: RE | Admit: 2024-06-21 | Discharge: 2024-06-21 | Attending: Urology

## 2024-06-21 ENCOUNTER — Encounter (HOSPITAL_COMMUNITY): Payer: Self-pay

## 2024-06-21 VITALS — Ht 70.0 in | Wt 230.0 lb

## 2024-06-21 DIAGNOSIS — E118 Type 2 diabetes mellitus with unspecified complications: Secondary | ICD-10-CM

## 2024-06-21 DIAGNOSIS — Z01818 Encounter for other preprocedural examination: Secondary | ICD-10-CM

## 2024-06-21 DIAGNOSIS — I480 Paroxysmal atrial fibrillation: Secondary | ICD-10-CM

## 2024-06-22 NOTE — Progress Notes (Signed)
 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  Patient admitted from 11/26-12/4/25 for AKI on CKD 2/2 obstructing R stone. He went to the OR on 11/29 for stent placement. Also had CHF exacerbation while inpatient and Cardiology was consulted. He was diuresed. Entresto  was held due to hypotension.  A right heart catheter was performed which showed mild pulmonary hypertension and mildly reduced cardiac output with cardiac output of 5.01 and cardiac index of 2.23. Started on Amiodarone  and Eliquis  and was discharged.  Patient with complicated cardiac hx. Follows with Cardiology.  Per Dr. Mona: He has a history of coronary artery disease with a bare-metal stent to the RCA in 2014, history of cardiac arrest due to ventricular fibrillation in 2018 and is status post AICD.  He is chronically reduced systolic heart failure with LVEF as low as 30 to 35% this year.  Recent echo confirms this.  He also has a remote history of atrial fibrillation but was not anticoagulated due to prior thigh hematoma and no recurrence of atrial fibrillation in many years.  In 2014 he had subacute bacterial endocarditis and required mitral valve repair and he had a prior stroke in 2022.  Recently he was noted to have a high burden of PVCs on his monitor which may have been the cause of his decreased in LV  function.  He was then started on mexiletine however noted that he had significant nausea and intolerance of the medication even despite reducing the dose.  Subsequently cardiac EP then switched him to amiodarone .  He thought he was having similar issues with this medicine and stopped it.  He was seen by his primary care provider earlier in the week for frequent vomiting and poor p.o. intake.  He was noted to be in acute kidney injury with a creatinine of 3.22.  An abdominal pelvic CT scan showed a stone in the right ureter with hydronephrosis and urology was consulted.  He received a right ureteral stent on November 29.  He was originally treated for possible pneumonia but now felt to be more likely to have congestive heart failure.  Recent interrogation of his ICD shows that he has been in persistent atrial fibrillation since November 9 but has not been anticoagulated.  Last seen by Dr. Mona in clinic on 12/12.  Per Dr. Mona: Today in the office he appears pale and is again hypotensive.  Blood pressure was 96/42.  He is on Plavix  and Eliquis  which was started because of plans for possible cardioversion.  Overall he says he feels unwell.  He was advised to stop Spironolactone . I advise stopping spironolactone .  In addition we will get stat labs including a CBC and metabolic profile today.  If he is anemic then I would likely recommend him to stop both Plavix  and Eliquis .  He should have proceed with any urologic procedure that he needs in a  couple weeks and if there is hopefully no evidence of continued bleeding afterwards we may consider restarting Eliquis  for his A-fib.  At this point I think it is unsafe to proceed with cardioversion with concerns for bleeding on anticoagulation or anemia  Device orders (in 12/16 progress note):  Device Information:   Clinic EP Physician:  Soyla Norton, MD    Device Type:  Defibrillator Manufacturer and Phone #:  Medtronic: [phone] Pacemaker Dependent?:   No. Date of Last Device Check:  05/29/2024      Normal Device Function?:  Yes.     Electrophysiologist's Recommendations:   Have magnet available. Provide continuous ECG monitoring when magnet is used or reprogramming is to be performed.  Procedure may interfere with device function.  Magnet should be placed over device during procedure.  LD Plavix : ~12/12 LD Eliquis : ~12/12 LD Farxiga : 12/20  VS:  Wt Readings from Last 3 Encounters:  06/21/24 104.3 kg  06/16/24 98 kg  06/16/24 104.3 kg   Temp Readings from Last 3 Encounters:  06/16/24 36.6 C (Temporal)  06/09/24 36.7 C (Oral)  06/08/24 36.8 C (Oral)   BP Readings from Last 3 Encounters:  06/16/24 112/68  06/16/24 (!) 96/42  06/09/24 120/70   Pulse Readings from Last 3 Encounters:  06/16/24 69  06/16/24 (!) 45  06/09/24 90     PROVIDERS: [name], MD Cardiologist - Kenneth Chad Hilty, MD  EP- Soyla Norton, MD     LABS: Labs reviewed: Acceptable for surgery. (all labs ordered are listed, but only abnormal results are displayed)  Labs Reviewed - No data to display   CXR 06/05/24:   IMPRESSION: 1. Low lung volumes with diffuse interstitial opacities and patchy bibasilar opacities, unchanged, likely mild pulmonary edema. 2. Unchanged small right pleural effusion. 3. Stable cardiomegaly with aortic atherosclerosis, cardiac valve replacement, and left chest AICD in place.  EKG 06/16/2024:  Atrial flutter with variable AV block PVCs or aberrantly conducted complexes Bifascicular block   Right heart cath 06/05/2024:  Mild pulmonary hypertension with mean PAP of 31 mmHg and PCWP of 23 mmHg-V wave of 27 mmHg.  PVR 2.59 By Fick and 3.18 by Thermal Ao sat 96%, PA sat 55%: Fick cardiac output 5.01, 2.23; Thermodilution 4.09-1.82.      RECOMMENDATIONS Continue management per primary cardiology team.  May still need additional diuresis but close to euvolemic. Patient had notable trace hematuria  when urinating in the Cath Lab.  This dissipated seen in the floor as well. Restart IV heparin  2 hours after sheath removal.   Echo 05/29/2024:  IMPRESSIONS    1. Left ventricular ejection fraction, by estimation, is 30 to 35%. The left ventricle has moderately decreased function. The left ventricle demonstrates global hypokinesis. The left ventricular internal cavity size was moderately dilated. Left ventricular diastolic function could not be evaluated. Elevated left atrial pressure. The E/e' is 27.  2. Right ventricular systolic function is mildly reduced. The right ventricular size is normal. Mildly increased right ventricular wall thickness. There is severely elevated pulmonary artery systolic pressure. The estimated right ventricular systolic pressure is 68.0 mmHg.  3. Left atrial size was mildly dilated.  4. Right atrial size was mildly dilated.  5. S/P 30mm Sorin Memo 3D Annuloplasty Ring, implanted 09/27/2012, no mitral stenosis (MG at HR 89bpm), mild mitral regurgitation.  6. The aortic valve is tricuspid. Aortic valve regurgitation is mild. Aortic valve sclerosis is present, with no evidence of aortic valve stenosis.  7. The inferior vena  cava is normal in size with greater than 50% respiratory variability, suggesting right atrial pressure of 3 mmHg.  Comparison(s): A prior study was performed on 08/12/2023. LVEF 30-35%, s/p mitral annuloplasty (MG 3 mmHG at HR 98bpm).  Event monitor 11/15/2023:  Patch Wear Time:  13 days and 22 hours   HR 65 - 214, average 67 bpm. 5 nonsustained SVT (longest 10 beats) and 302 nonsustained VT (longest 14.3 seconds beats) No atrial fibrillation detected. Occasional supraventricular ectopy, 1.4%. Frequent ventricular ectopy, 14.3%. No sustained arrhythmias. No symptom trigger episodes.  Left heart cath 08/12/2023:     Ramus lesion is 30% stenosed.   Prox RCA lesion is 30% stenosed.   Previously placed Dist RCA stent of  unknown type is  widely patent.   LV end diastolic pressure is normal.   Mild nonobstructive CAD. Normal LVEDP 14 mm Hg   Medical management  Past Medical History:  Diagnosis Date   Arthritis    Atrial fibrillation (HCC)    post op, intol of anticoag   Cardiac arrest (HCC)    a. s/p MDT single chamber ICD; 11-10-18- pt denies having a heart attack   Cataract    CHF (congestive heart failure) (HCC)    Chronic kidney disease    kidney stone   Coronary artery disease    a. 2/7 Cath: LM nl, LAD min irregs, LCX min irregs, RI 40, RCA 31m, EF 55-60% basal to mid inf HK, 3-4+ MR;  b. 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 Mitral valve prolapse with flail segment of posterior leaflet and severe MR by TEE, remote h/o bacterial endocarditis    Sleep apnea    NPSG 01/21/06- AHI 40.7/hr cpap   Stroke [facility])    summer 2022- one messed up vision, the other balance   Subacute bacterial endocarditis 03/22/2008   Strep viridans   Ventricular fibrillation (HCC) 11/07/2019   appropriate shock (36J) for VF delivered    Past Surgical History:  Procedure Laterality Date   AMPUTATION  07/28/2012   Procedure: AMPUTATION DIGIT;  Surgeon: Deward DELENA Schwartz, MD;  Location: [facility];  Service: Orthopedics;  Laterality: Right;  2nd toe   CARDIAC CATHETERIZATION     COLONOSCOPY     CYSTOSCOPY W/ URETERAL STENT PLACEMENT Right 06/03/2024   Procedure: CYSTOSCOPY, WITH RETROGRADE PYELOGRAM AND URETERAL STENT  INSERTION;  Surgeon: Carolee Sherwood JONETTA DOUGLAS, MD;  Location: [facility] OR;  Service: Urology;  Laterality: Right;   EP IMPLANTABLE DEVICE N/A 05/01/2016   Procedure: Loop Recorder Removal;  Surgeon: Lynwood Rakers, MD;  Location: [facility] INVASIVE CV LAB;  Service: Cardiovascular;  Laterality: N/A;   FOOT SURGERY     BILATERAL TOES   ICD IMPLANT N/A 05/07/2017    Medtronic Visia AF MRI VR SureScan implanted by Dr Inocencio following VF arrest   Implantable loop recorder placement  03/31/2013   MDT Linq implanted by Dr Rakers to evaluate for further afib  INTRAOPERATIVE TRANSESOPHAGEAL ECHOCARDIOGRAM N/A 09/27/2012   Procedure: INTRAOPERATIVE TRANSESOPHAGEAL ECHOCARDIOGRAM;  Surgeon: Sudie VEAR Laine, MD;  Location: [facility] OR;  Service: Open Heart Surgery;  Laterality: N/A;   LEFT AND RIGHT HEART CATHETERIZATION WITH CORONARY ANGIOGRAM N/A 08/12/2012   Procedure: LEFT AND RIGHT HEART CATHETERIZATION WITH CORONARY ANGIOGRAM;  Surgeon: Ezra GORMAN Shuck, MD;  Location: [facility] CATH LAB;  Service: Cardiovascular;  Laterality: N/A;   LEFT HEART CATH AND CORONARY ANGIOGRAPHY N/A 05/06/2017   Procedure: LEFT HEART CATH AND CORONARY ANGIOGRAPHY;  Surgeon: Anner Alm ORN, MD;  Location: [facility] INVASIVE CV LAB;  Service: Cardiovascular;  Laterality: N/A;   LEFT HEART CATH AND CORONARY ANGIOGRAPHY N/A 08/12/2023   Procedure: LEFT HEART CATH AND CORONARY ANGIOGRAPHY;  Surgeon: Jordan, Peter M, MD;  Location: [facility] INVASIVE CV LAB;  Service: Cardiovascular;  Laterality: N/A;   LOOP RECORDER IMPLANT N/A 03/31/2013   Procedure: LOOP RECORDER IMPLANT;  Surgeon: Lynwood JONETTA Rakers, MD;  Location: [facility] CATH LAB;  Service: Cardiovascular;  Laterality: N/A;   MITRAL VALVE REPAIR Right 09/27/2012   Procedure: MINIMALLY INVASIVE MITRAL VALVE REPAIR (MVR);  Surgeon: Sudie VEAR Laine, MD;  Location: [facility] OR;  Service: Open Heart Surgery;  Laterality: Right;  Ultrasound guided   PERCUTANEOUS CORONARY STENT INTERVENTION (PCI-S) N/A 08/25/2012   Procedure: PERCUTANEOUS  CORONARY STENT INTERVENTION (PCI-S);  Surgeon: Ozell Fell, MD;  Location: [facility] CATH LAB;  Service: Cardiovascular;  Laterality: N/A;   POLYPECTOMY     RIGHT HEART CATH N/A 06/05/2024   Procedure: RIGHT HEART CATH;  Surgeon: Anner Alm ORN, MD;  Location: [facility] INVASIVE CV LAB;  Service: Cardiovascular;  Laterality: N/A;   SEPTOPLASTY     Dr. Floy   TEE WITHOUT CARDIOVERSION N/A 08/12/2012   Procedure: TRANSESOPHAGEAL ECHOCARDIOGRAM (TEE);  Surgeon: Ezra GORMAN Shuck, MD;  Location: [facility] ENDOSCOPY;  Service: Cardiovascular;  Laterality: N/A;   TONSILLECTOMY     UVULOPALATOPHARYNGOPLASTY      MEDICATIONS:  Accu-Chek Softclix Lancets lancets   acetaminophen  (TYLENOL ) 500 MG tablet   amiodarone  (PACERONE ) 200 MG tablet   apixaban  (ELIQUIS ) 5 MG TABS tablet   atorvastatin  (LIPITOR) 40 MG tablet   cetirizine (ZYRTEC) 10 MG tablet   cholecalciferol  (VITAMIN D ) 25 MCG (1000 UNIT) tablet   Choline Fenofibrate  (FENOFIBRIC ACID ) 45 MG CPDR   clopidogrel  (PLAVIX ) 75 MG tablet   dapagliflozin  propanediol (FARXIGA ) 10 MG TABS tablet   fluticasone  (FLONASE ) 50 MCG/ACT nasal spray   furosemide  (LASIX ) 80 MG tablet   glucose blood (ACCU-CHEK GUIDE TEST) test strip   Insulin  Lispro Prot & Lispro (HUMALOG  MIX 75/25 KWIKPEN) (75-25) 100 UNIT/ML Kwikpen   metFORMIN  (GLUCOPHAGE ) 500 MG tablet   metoprolol  succinate (TOPROL -XL) 100 MG 24 hr tablet   naphazoline-pheniramine (NAPHCON-A) 0.025-0.3 % ophthalmic solution   OVER THE COUNTER MEDICATION   spironolactone  (ALDACTONE ) 25 MG tablet   tamsulosin  (FLOMAX ) 0.4 MG CAPS capsule   No current facility-administered medications for this encounter.   Burnard CHRISTELLA Odis DEVONNA [facility] Short Stay/Anesthesiology [facility] Phone [phone] 06/22/2024 11:35 AM

## 2024-06-22 NOTE — Anesthesia Preprocedure Evaluation (Addendum)
"                                    Anesthesia Evaluation  Patient identified by MRN, date of birth, ID band Patient awake    Reviewed: Allergy & Precautions, NPO status , Patient's Chart, lab work & pertinent test results  History of Anesthesia Complications (+) PONV and history of anesthetic complications  Airway Mallampati: II  TM Distance: >3 FB Neck ROM: Full    Dental no notable dental hx.    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 Left ventricular ejection fraction, by estimation, is 30 to 35%. The left ventricle has moderately decreased function. The left ventricle demonstrates global hypokinesis. The left ventricular internal cavity size was moderately dilated. Left ventricular diastolic function could not be evaluated. Elevated left atrial pressure. The E/e' is 27.  2. Right ventricular systolic function is mildly reduced. The right ventricular size is normal. Mildly increased right ventricular wall thickness. There is severely elevated pulmonary artery systolic pressure. The estimated right ventricular systolic pressure is 68.0 mmHg.  3. Left atrial size was mildly dilated.  4. Right atrial size was mildly dilated.  5. S/P 30mm Sorin Memo 3D Annuloplasty Ring, implanted 09/27/2012, no mitral stenosis (MG at HR 89bpm), mild mitral regurgitation.  6. The aortic valve is tricuspid. Aortic valve regurgitation is mild. Aortic valve sclerosis is present, with no evidence of aortic valve stenosis.  7. The inferior vena cava is normal in size with greater than 50% respiratory variability, suggesting right atrial pressure of 3 mmHg.   Comparison(s): A prior study was performed on 08/12/2023. LVEF 30-35%, s/p  mitral annuloplasty (MG 3 mmHG at HR 98bpm).    Neuro/Psych CVA  negative psych ROS   GI/Hepatic Neg liver ROS,GERD  ,,  Endo/Other  diabetes, Type 2, Insulin  Dependent    Renal/GU Renal disease  negative genitourinary   Musculoskeletal  (+) Arthritis , Osteoarthritis,    Abdominal Normal abdominal exam  (+)   Peds  Hematology   Anesthesia Other Findings   Reproductive/Obstetrics                              Anesthesia Physical Anesthesia Plan  ASA: 4  Anesthesia Plan: General   Post-op Pain Management:    Induction: Intravenous  PONV Risk Score and Plan: 3 and Ondansetron , Dexamethasone  and Treatment may vary due to age or medical condition  Airway Management Planned: LMA  Additional Equipment: None  Intra-op Plan:   Post-operative Plan: Extubation in OR  Informed Consent: I have reviewed the patients History and Physical, chart, labs and discussed the procedure including the risks, benefits and alternatives for the proposed anesthesia with the patient or authorized representative who has indicated his/her understanding and acceptance.     Dental advisory given  Plan Discussed with: CRNA  Anesthesia Plan Comments: ( )         Anesthesia Quick Evaluation  "

## 2024-06-24 ENCOUNTER — Other Ambulatory Visit: Payer: Self-pay | Admitting: Internal Medicine

## 2024-06-26 ENCOUNTER — Other Ambulatory Visit: Admitting: *Deleted

## 2024-06-26 NOTE — Patient Instructions (Signed)
 Visit Information  Thank you for taking time to visit with me today. Please don't hesitate to contact me if I can be of assistance to you before our next scheduled telephone appointment.  Our next appointment is by telephone on Tuesday, July 04, 2024 at 2:00 pm  Please call the care guide team at [phone] if you need to cancel or reschedule your appointment.   Following are the goals we discussed today:  Patient Self Care Activities:  Attend all scheduled provider appointments Call provider office for new concerns or questions  Participate in Transition of Care Program/Attend TOC scheduled calls Take medications as prescribed   use salt in moderation watch for swelling in feet, ankles and legs every day know when to call the doctor:for shortness of breath that is increased from your normal breathing; for increased swelling in your legs- ankles- feet or abdomen check blood sugar at prescribed times: once daily and when you have symptoms of low or high blood sugar take the blood sugar log to all doctor visits Continue pacing activity to avoid episodes of shortness of breath Continue monitoring and recording your blood sugars at home Use assistive devices as needed to prevent falls- walker / cane as needed Eat a heart healthy and low salt diet Please go over all of your medications and medication question/ concerns with your care providers (doctor) If you believe your condition is getting worse- contact your care providers (doctors) promptly- reaching out to your doctor early when you have concerns can prevent you from having to go to the hospital  If you are experiencing a Mental Health or Behavioral Health Crisis or need someone to talk to, please call the Suicide and Crisis Lifeline: 988 call the [place]  National Suicide Prevention Lifeline: 727-851-9583 or TTY: (709)086-7655 TTY 310-582-3181) to talk to a trained counselor call 1-800-273-TALK (toll free, 24 hour hotline) go to  [facility] [address] [place] [phone]) call the [facility] Crisis Line: ([phone] call 911   Care plan and visit instructions communicated with the patient verbally today. Patient agrees to receive a copy in MyChart. Active MyChart status and patient understanding of how to access instructions and care plan via MyChart confirmed with patient.     Christopher Kurek Mckinney Rishikesh Khachatryan, RN, BSN, Media Planner  Transitions of Care  [facility] [phone]: direct office

## 2024-06-26 NOTE — Transitions of Care (Post Inpatient/ED Visit) (Signed)
 " Transition of Care week 4/ day # 18  Visit Note  06/26/2024  Name: CASMERE HOLLENBECK MRN: [id number]          DOB: 1950-11-09  Situation: Patient enrolled in [facility] 30-day program. Visit completed with patient by telephone.   HIPAA identifiers x 2 verified  Background:  Lives alone, limited social/ family support; independent in self-care activities; no assistive devices at baseline; drives self Needs education- reinforcement around self- management of multiple chronic health conditions; medications Recent hospitalization November 26- June 08, 2024 for AKI/ kidney stone; Acute on Chronic CHF (2) unplanned hospitalizations x last (12) months; (1) x last 6 months  Initial Transition Care Management Follow-up Telephone Call Discharge Date and Diagnosis: 06/08/24, AKI: (R) kidney stone with uretal stent placement; Acute on chronic CHF with cardiac catherization   Past Medical History:  Diagnosis Date   Arthritis    Atrial fibrillation (HCC)    post op, intol of anticoag   Cardiac arrest (HCC)    a. s/p MDT single chamber ICD; 11-10-18- pt denies having a heart attack   Cataract    CHF (congestive heart failure) (HCC)    Chronic kidney disease    kidney stone   Coronary artery disease    a. 2/7 Cath: LM nl, LAD min irregs, LCX min irregs, RI 40, RCA 29m, EF 55-60% basal to mid inf HK, 3-4+ MR;  b. 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 Mitral valve prolapse with flail segment of  posterior leaflet and severe MR by TEE, remote h/o bacterial endocarditis    Sleep apnea    NPSG 01/21/06- AHI 40.7/hr cpap   Stroke [facility])    summer 2022- one messed up vision, the other balance   Subacute bacterial endocarditis 03/22/2008   Strep viridans   Ventricular fibrillation (HCC) 11/07/2019   appropriate shock (36J) for VF delivered   Assessment:  I am doing about the same as I was last week; my blood sugars have been staying up above 100- so that is good; it has been mainly around the 130's; I got all of my questions answered about the procedure on Christmas Eve.... they told me I should be able to come home after the procedure is completed.  I have all the medicine changes they wanted me to make written down, so I know what I am supposed to do, and have made those changes like they told me to    Denies definitive clinical concerns and sounds to be in no distress throughout [facility] 30-day program outreach call today  Patient Reported Symptoms: Cognitive Cognitive Status: Alert and oriented to person, place, and time, Normal speech and language skills, Difficulties with attention and concentration, Insightful and able to interpret abstract concepts Cognitive/Intellectual Conditions Management [RPT]: None reported or documented in medical history or problem list      Neurological Neurological Review of Symptoms: No symptoms reported    HEENT HEENT Symptoms Reported: No  symptoms reported      Cardiovascular Cardiovascular Symptoms Reported: No symptoms reported Other Cardiovascular Symptoms: Continues to adamantly reporthe has no interest/ willingness to monitor blood pressures/ daily weights at home: so far as I know I am nothaving any problems Does patient have uncontrolled Hypertension?: No Cardiovascular Management Strategies: Routine screening, Coping strategies, Medication therapy, Adequate rest  Respiratory Respiratory Symptoms Reported: No symptoms reported Other  Respiratory Symptoms: Denies shortness of breath and sounds to be in no distress throughout TOC call Respiratory Management Strategies: Routine screening, Medication therapy, Adequate rest, Coping strategies  Endocrine Endocrine Symptoms Reported: No symptoms reported Is patient diabetic?: Yes Is patient checking blood sugars at home?: Yes List most recent blood sugar readings, include date and time of day: My blood sugars have stayed up above 100, so that is better; today it was 130- it has mostly been staying around 130's    Gastrointestinal Gastrointestinal Symptoms Reported: No symptoms reported      Genitourinary Genitourinary Symptoms Reported: No symptoms reported Additional Genitourinary Details: Confirms plans to attend scheduled outpatient lithotrypsy (per his own report) and uretal stent placement, scheduled for 06/28/24 Genitourinary Management Strategies: Coping strategies  Integumentary Integumentary Symptoms Reported: No symptoms reported    Musculoskeletal Musculoskelatal Symptoms Reviewed: Weakness Additional Musculoskeletal Details: confirmed not currently requiring/ using assistive devices for ambulation- continues to report uses walker/ walking stick prn only sometimes when I go outside of my house; confirms no new/ recent falls since last [facility] outreach Musculoskeletal Management Strategies: Routine screening, Medical device, Coping strategies   Fall risk Follow up: Falls prevention discussed  Psychosocial Psychosocial Symptoms Reported: No symptoms reported Behavioral Management Strategies: Support system, Coping strategies Major Change/Loss/Stressor/Fears (CP): Medical condition, self Techniques to Cope with Loss/Stress/Change: Diversional activities Quality of Family Relationships: supportive, helpful, involved   There were no vitals filed for this visit.    Medications Reviewed Today     Reviewed by Anthonee Gelin M, RN (Registered Nurse) on 06/26/24 at 1440   Med List Status: <None>   Medication Order Taking? Sig Documenting Provider Last Dose Status Informant  Accu-Chek Softclix Lancets lancets [id number]  1 each by Other route See admin instructions. Use TID [name], MD  Active Self  acetaminophen  (TYLENOL ) 500 MG tablet [id number]  Take 1,000 mg by mouth every 6 (six) hours as needed for mild pain (pain score 1-3) or headache. [provider]  Active Self  amiodarone  (PACERONE ) 200 MG tablet [id number]  Take 1 tablet (200 mg total) by mouth 2 (two) times daily for 30 days, THEN 1 tablet (200 mg total) daily. Inocencio Soyla Lunger, MD  Active Self  apixaban  (ELIQUIS ) 5 MG TABS tablet [id number]  Take 1 tablet (5 mg total) by mouth 2 (two) times daily. Gonfa, Taye T, MD  Active Self           Med Note CLAUD, MICHEAL DASEN   Fri Jun 16, 2024  3:33 PM) On hold per MD instruction  atorvastatin  (LIPITOR) 40 MG tablet [id number]  Take 1 tablet (40 mg total) by mouth daily. [name], MD  Active Self  cetirizine (ZYRTEC) 10 MG tablet [id number]  Take 10 mg by mouth daily. [provider]  Active Self  cholecalciferol  (VITAMIN D ) 25 MCG (1000 UNIT) tablet [id number]  Take 1,000 Units by mouth daily. [provider]  Active Self  Choline Fenofibrate  (FENOFIBRIC ACID ) 45 MG CPDR [id number]  Take 1 tablet by mouth daily. Mona Vinie BROCKS, MD  Active Self  clopidogrel  (  PLAVIX ) 75 MG tablet [id number]  Take 1 tablet (75 mg total) by mouth daily. [name], MD  Active Self  dapagliflozin  propanediol (FARXIGA ) 10 MG TABS tablet [id number]  Take 1 tablet (10 mg total) by mouth daily. [name], MD  Active Self  fluticasone  (FLONASE ) 50 MCG/ACT nasal spray [id number]  Place 2 sprays into both nostrils daily.  Patient taking differently: Place 2 sprays into both nostrils daily as needed for allergies.   [name], MD  Active Self  furosemide  (LASIX ) 80 MG tablet [id number]  TAKE 1 TABLET BY  MOUTH EVERY DAY Hilty, Vinie BROCKS, MD  Active   glucose blood (ACCU-CHEK GUIDE TEST) test strip [id number]  Use as instructed [name], MD  Active Self  Insulin  Lispro Prot & Lispro (HUMALOG  MIX 75/25 KWIKPEN) (75-25) 100 UNIT/ML Kary [id number]  Inject 18 Units into the skin in the morning and at bedtime. [name], MD  Active Self  metFORMIN  (GLUCOPHAGE ) 500 MG tablet [id number]  Take 1 tablet (500 mg total) by mouth 2 (two) times daily with a meal. Gonfa, Taye T, MD  Active Self  metoprolol  succinate (TOPROL -XL) 100 MG 24 hr tablet [id number]  Take 1 tablet (100 mg total) by mouth daily. [name], MD  Active Self  naphazoline-pheniramine (NAPHCON-A) 0.025-0.3 % ophthalmic solution [id number]  Place 2 drops into both eyes in the morning. [provider]  Active Self  OVER THE COUNTER MEDICATION [id number]  Take 1 tablet by mouth in the morning and at bedtime. [facility] - Restore multivitamin [provider]  Active Self  spironolactone  (ALDACTONE ) 25 MG tablet [id number]  TAKE 1 TABLET BY MOUTH DAILY Hilty, Vinie BROCKS, MD  Active Self  tamsulosin  (FLOMAX ) 0.4 MG CAPS capsule [id number]  Take 1 capsule (0.4 mg total) by mouth daily. [name], MD  Active Self           Recommendation:   Specialty provider follow-up- outpatient urology procedure, as scheduled 06/28/24 Continue Current Plan of Care  Follow Up Plan:   Telephone follow-up in 1 week- as scheduled 07/04/24  I appreciate the opportunity to participate in Steve's care,   Pls call/ message for questions,  Mardel Grudzien Mckinney Kaisyn Reinhold, RN, BSN, CCRN Alumnus RN Care Manager  Transitions of Care  [facility] [phone]: direct office     "

## 2024-06-28 ENCOUNTER — Encounter (HOSPITAL_COMMUNITY)
Admission: RE | Disposition: A | Discharge status: Home / Self Care | Payer: Self-pay | Source: Ambulatory Visit | Attending: Urology

## 2024-06-28 ENCOUNTER — Ambulatory Visit (HOSPITAL_COMMUNITY): Admitting: Anesthesiology

## 2024-06-28 ENCOUNTER — Ambulatory Visit (HOSPITAL_COMMUNITY)
Admission: RE | Admit: 2024-06-28 | Discharge: 2024-06-28 | Disposition: A | Discharge status: Home / Self Care | Source: Ambulatory Visit | Attending: Urology | Admitting: Urology

## 2024-06-28 ENCOUNTER — Ambulatory Visit (HOSPITAL_COMMUNITY): Payer: Self-pay | Admitting: Medical

## 2024-06-28 ENCOUNTER — Other Ambulatory Visit: Payer: Self-pay

## 2024-06-28 ENCOUNTER — Encounter (HOSPITAL_COMMUNITY): Payer: Self-pay | Admitting: Urology

## 2024-06-28 ENCOUNTER — Ambulatory Visit (HOSPITAL_COMMUNITY)

## 2024-06-28 DIAGNOSIS — I2510 Atherosclerotic heart disease of native coronary artery without angina pectoris: Secondary | ICD-10-CM | POA: Diagnosis not present

## 2024-06-28 DIAGNOSIS — I4891 Unspecified atrial fibrillation: Secondary | ICD-10-CM | POA: Diagnosis not present

## 2024-06-28 DIAGNOSIS — N201 Calculus of ureter: Secondary | ICD-10-CM

## 2024-06-28 DIAGNOSIS — Z955 Presence of coronary angioplasty implant and graft: Secondary | ICD-10-CM | POA: Diagnosis not present

## 2024-06-28 DIAGNOSIS — G4730 Sleep apnea, unspecified: Secondary | ICD-10-CM | POA: Diagnosis not present

## 2024-06-28 DIAGNOSIS — Z794 Long term (current) use of insulin: Secondary | ICD-10-CM | POA: Diagnosis not present

## 2024-06-28 DIAGNOSIS — I110 Hypertensive heart disease with heart failure: Secondary | ICD-10-CM | POA: Diagnosis not present

## 2024-06-28 DIAGNOSIS — K219 Gastro-esophageal reflux disease without esophagitis: Secondary | ICD-10-CM | POA: Insufficient documentation

## 2024-06-28 DIAGNOSIS — M1990 Unspecified osteoarthritis, unspecified site: Secondary | ICD-10-CM | POA: Insufficient documentation

## 2024-06-28 DIAGNOSIS — Z01818 Encounter for other preprocedural examination: Secondary | ICD-10-CM

## 2024-06-28 DIAGNOSIS — E119 Type 2 diabetes mellitus without complications: Secondary | ICD-10-CM | POA: Insufficient documentation

## 2024-06-28 DIAGNOSIS — Z95810 Presence of automatic (implantable) cardiac defibrillator: Secondary | ICD-10-CM | POA: Insufficient documentation

## 2024-06-28 DIAGNOSIS — Z87891 Personal history of nicotine dependence: Secondary | ICD-10-CM | POA: Insufficient documentation

## 2024-06-28 DIAGNOSIS — I509 Heart failure, unspecified: Secondary | ICD-10-CM | POA: Insufficient documentation

## 2024-06-28 DIAGNOSIS — I080 Rheumatic disorders of both mitral and aortic valves: Secondary | ICD-10-CM | POA: Diagnosis not present

## 2024-06-28 DIAGNOSIS — I252 Old myocardial infarction: Secondary | ICD-10-CM | POA: Insufficient documentation

## 2024-06-28 DIAGNOSIS — E118 Type 2 diabetes mellitus with unspecified complications: Secondary | ICD-10-CM

## 2024-06-28 HISTORY — PX: CYSTOSCOPY/URETEROSCOPY/HOLMIUM LASER/STENT PLACEMENT: SHX6546

## 2024-06-28 LAB — GLUCOSE, CAPILLARY
Glucose-Capillary: 114 mg/dL — ABNORMAL HIGH (ref 70–99)
Glucose-Capillary: 97 mg/dL (ref 70–99)

## 2024-06-28 SURGERY — CYSTOSCOPY/URETEROSCOPY/HOLMIUM LASER/STENT PLACEMENT
Anesthesia: General | Laterality: Right

## 2024-06-28 MED ORDER — FENTANYL CITRATE (PF) 100 MCG/2ML IJ SOLN
INTRAMUSCULAR | Status: DC | PRN
  Administered 2024-06-28 (×4): 25 ug via INTRAVENOUS

## 2024-06-28 MED ORDER — EPHEDRINE SULFATE-NACL 50-0.9 MG/10ML-% IV SOSY
PREFILLED_SYRINGE | INTRAVENOUS | Status: DC | PRN
  Administered 2024-06-28: 10 mg via INTRAVENOUS

## 2024-06-28 MED ORDER — PHENYLEPHRINE 80 MCG/ML (10ML) SYRINGE FOR IV PUSH (FOR BLOOD PRESSURE SUPPORT)
PREFILLED_SYRINGE | INTRAVENOUS | Status: DC | PRN
  Administered 2024-06-28 (×2): 80 ug via INTRAVENOUS
  Administered 2024-06-28 (×2): 160 ug via INTRAVENOUS
  Administered 2024-06-28: 80 ug via INTRAVENOUS

## 2024-06-28 MED ORDER — SODIUM CHLORIDE 0.9 % IR SOLN
Status: DC | PRN
  Administered 2024-06-28: 3000 mL

## 2024-06-28 MED ORDER — LACTATED RINGERS IV SOLN: INTRAVENOUS | Status: DC

## 2024-06-28 MED ORDER — CHLORHEXIDINE GLUCONATE 0.12 % MT SOLN
15.0000 mL | Freq: Once | OROMUCOSAL | Status: AC
  Administered 2024-06-28: 15 mL via OROMUCOSAL

## 2024-06-28 MED ORDER — PROPOFOL 10 MG/ML IV BOLUS
INTRAVENOUS | Status: DC | PRN
  Administered 2024-06-28: 180 mg via INTRAVENOUS

## 2024-06-28 MED ORDER — INSULIN ASPART 100 UNIT/ML IJ SOLN: 0.0000 [IU] | INTRAMUSCULAR | Status: DC | PRN

## 2024-06-28 MED ORDER — FENTANYL CITRATE (PF) 100 MCG/2ML IJ SOLN
INTRAMUSCULAR | Status: AC
  Filled 2024-06-28: qty 2

## 2024-06-28 MED ORDER — LIDOCAINE 2% (20 MG/ML) 5 ML SYRINGE
INTRAMUSCULAR | Status: DC | PRN
  Administered 2024-06-28: 60 mg via INTRAVENOUS

## 2024-06-28 MED ORDER — IOHEXOL 300 MG/ML  SOLN
INTRAMUSCULAR | Status: DC | PRN
  Administered 2024-06-28: 10 mL

## 2024-06-28 MED ORDER — CEFAZOLIN SODIUM-DEXTROSE 2-4 GM/100ML-% IV SOLN
2.0000 g | INTRAVENOUS | Status: AC
  Administered 2024-06-28: 2 g via INTRAVENOUS
  Filled 2024-06-28: qty 100

## 2024-06-28 MED ORDER — ONDANSETRON HCL 4 MG/2ML IJ SOLN
INTRAMUSCULAR | Status: DC | PRN
  Administered 2024-06-28: 4 mg via INTRAVENOUS

## 2024-06-28 MED ORDER — ORAL CARE MOUTH RINSE: 15.0000 mL | Freq: Once | OROMUCOSAL | Status: AC

## 2024-06-28 MED ORDER — HYDROCODONE-ACETAMINOPHEN 5-325 MG PO TABS: 1.0000 | ORAL_TABLET | Freq: Four times a day (QID) | ORAL | 0 refills | Status: AC | PRN

## 2024-06-28 SURGICAL SUPPLY — 21 items
BAG URO CATCHER STRL LF (MISCELLANEOUS) ×1 IMPLANT
BASKET LASER NITINOL 1.9FR (BASKET) IMPLANT
BASKET ZERO TIP NITINOL 2.4FR (BASKET) IMPLANT
CATH URETERAL DUAL LUMEN 10F (MISCELLANEOUS) IMPLANT
CATH URETL OPEN END 6FR 70 (CATHETERS) ×1 IMPLANT
CLOTH BEACON ORANGE TIMEOUT ST (SAFETY) ×1 IMPLANT
FIBER LASER MOSES 365 DFL (Laser) IMPLANT
GLOVE BIO SURGEON STRL SZ7.5 (GLOVE) ×1 IMPLANT
GOWN STRL REUS W/ TWL XL LVL3 (GOWN DISPOSABLE) ×1 IMPLANT
GUIDEWIRE ANG ZIPWIRE 038X150 (WIRE) IMPLANT
GUIDEWIRE STR DUAL SENSOR (WIRE) ×1 IMPLANT
KIT TURNOVER KIT A (KITS) ×1 IMPLANT
MANIFOLD NEPTUNE II (INSTRUMENTS) ×1 IMPLANT
PACK CYSTO (CUSTOM PROCEDURE TRAY) ×1 IMPLANT
SHEATH NAVIGATOR HD 11/13X28 (SHEATH) IMPLANT
SHEATH NAVIGATOR HD 11/13X36 (SHEATH) IMPLANT
SHEATH NAVIGATOR HD 12/14X36 (SHEATH) IMPLANT
STENT URET 6FRX26 CONTOUR (STENTS) IMPLANT
TRACTIP FLEXIVA PULS ID 200XHI (Laser) IMPLANT
TUBING CONNECTING 10 (TUBING) ×1 IMPLANT
TUBING UROLOGY SET (TUBING) ×1 IMPLANT

## 2024-06-28 NOTE — Op Note (Signed)
 Operative Note  Preoperative diagnosis:  1.  Right ureteral calculus  Postoperative diagnosis: 1.  Right ureteral calculus  Procedure(s): 1.  Cystoscopy with right retrograde pyelogram, right ureteroscopy laser lithotripsy, ureteral stent exchange  Surgeon: Sherwood Edison, MD  Assistants: None  Anesthesia: General  Complications: None immediate  EBL: Minimal  Specimens: 1.  None  Drains/Catheters: 1.  6 x 26 double-J ureteral stent  Intraoperative findings: 1.  Normal anterior urethra 2.  Obstructing prostate with bilobar hypertrophy and a high riding stiff bladder neck.  Prostate examination without any nodularity and prostate was about 50 g. 3.  Normal bladder mucosa 4.  Right ureteroscopy confirmed a proximal right ureteral calculus about 1 cm fragmented to tiny fragments and dust settings. 5.  Right retrograde pyelogram after treatment of stone showed no filling defect and no hydronephrosis.  Indication: 73 year old male with a right ureteral calculus status post ureteral stent presents for the previously mentioned operation.  Of note he was evaluated by cardiology and is blood thinners were held given active gross hematuria and upcoming surgery.  He understands the risk of holding.  Description of procedure:  The patient was identified and consent was obtained.  The patient was taken to the operating room and placed in the supine position.  The patient was placed under general anesthesia.  Perioperative antibiotics were administered.  The patient was placed in dorsal lithotomy.  Patient was prepped and draped in a standard sterile fashion and a timeout was performed.  A 21 French rigid cystoscope was advanced into the urethra and into the bladder.  Complete cystoscopy was performed with findings noted above.  Stent was grasped pulled just beyond the urethral meatus.  Wire was passed through the stent up to the kidney under fluoroscopic guidance.  Stent was withdrawn.  Semirigid  ureteroscopy was performed alongside the wire and could not quite get to the stone because it was proximal, almost to the kidney.  I therefore passed another wire through the semirigid ureteroscope and into the kidney and withdrew the scope.  A 12 x 14 access sheath was advanced over the wire under continuous fluoroscopic guidance up to the proximal ureter just distal to the stone.  Inner sheath and wire were withdrawn.  Digital ureteroscopy was performed and the stone was lasered fragmented all dust settings to tiny fragments.  All clinically significant stone was treated.  It was no longer visible on x-ray.  I showed retrograde pyelogram through the scope with findings noted above.  I withdrew the scope along with the access sheath visualizing the ureter upon removal.  There were no ureteral calculi and there was no ureteral injury identified.  I backloaded the wire onto rigid cystoscope and advanced that into the bladder followed by routine placement of a 6 x 26 double-J ureteral stent.  Fluoroscopy confirmed proximal placement and direct visualization confirmed a good coil in the bladder.  I drained the bladder withdrew the scope.  Patient tolerated the procedure well was stable postoperatively.    Plan: Follow-up in 1 week for stent removal

## 2024-06-28 NOTE — Interval H&P Note (Signed)
 History and Physical Interval Note:  In the interval, the patient was evaluated by cardiology.  Clear recommendations left in the chart and I personally discussed over the phone with his cardiologist risk and benefits.  Patient is in full understanding of the risk of the procedure and anesthesia.  Holding anticoagulation at this time due to procedural intervention.  Aspirin  81 is okay.  Ultimately okay to have blood thinner if necessary with this procedure but preferably aspirin  81 only to decrease the risk of bleeding and clot formation in the bladder with subsequent retention.  06/28/2024 10:55 AM  Christopher [name]  has presented today for surgery, with the diagnosis of RIGHT URETERAL STONE.  The various methods of treatment have been discussed with the patient and family. After consideration of risks, benefits and other options for treatment, the patient has consented to  Procedures: CYSTOSCOPY/URETEROSCOPY/HOLMIUM LASER/STENT PLACEMENT (Right) as a surgical intervention.  The patient's history has been reviewed, patient examined, no change in status, stable for surgery.  I have reviewed the patient's chart and labs.  Questions were answered to the patient's satisfaction.     Sherwood JONETTA Edison, III

## 2024-06-28 NOTE — Transfer of Care (Signed)
 Immediate Anesthesia Transfer of Care Note  Patient: Christopher [name]  Procedure(s) Performed: CYSTOSCOPY/URETEROSCOPY/HOLMIUM LASER/STENT PLACEMENT (Right)  Patient Location: PACU  Anesthesia Type:General  Level of Consciousness: awake, alert , and oriented  Airway & Oxygen Therapy: Patient Spontanous Breathing and Patient connected to face mask oxygen  Post-op Assessment: Report given to RN and Post -op Vital signs reviewed and stable  Post vital signs: Reviewed and stable  Last Vitals:  Vitals Value Taken Time  BP 123/76 06/28/24 12:33  Temp    Pulse 134 06/28/24 12:36  Resp 17 06/28/24 12:36  SpO2 100 % 06/28/24 12:36  Vitals shown include unfiled device data.  Last Pain:  Vitals:   06/28/24 0923  TempSrc: Oral         Complications: No notable events documented.

## 2024-06-28 NOTE — Anesthesia Procedure Notes (Addendum)
 Procedure Name: LMA Insertion Date/Time: 06/28/2024 11:30 AM  Performed by: Cindie Charleen PARAS, CRNAPre-anesthesia Checklist: Emergency Drugs available, Patient identified, Suction available, Patient being monitored and Timeout performed Patient Re-evaluated:Patient Re-evaluated prior to induction Oxygen Delivery Method: Circle system utilized Preoxygenation: Pre-oxygenation with 100% oxygen Induction Type: IV induction Ventilation: Mask ventilation without difficulty LMA: LMA with gastric port inserted LMA Size: 4.0 Number of attempts: 1 Tube secured with: Tape Dental Injury: Teeth and Oropharynx as per pre-operative assessment  Comments: Easy pass of LMA.

## 2024-06-28 NOTE — Discharge Instructions (Signed)

## 2024-06-29 ENCOUNTER — Encounter (HOSPITAL_COMMUNITY): Payer: Self-pay | Admitting: Urology

## 2024-06-30 NOTE — Anesthesia Postprocedure Evaluation (Signed)
"   Anesthesia Post Note  Patient: Christopher [name]  Procedure(s) Performed: CYSTOSCOPY/URETEROSCOPY/HOLMIUM LASER/STENT PLACEMENT (Right)     Patient location during evaluation: PACU Anesthesia Type: General Level of consciousness: awake and alert Pain management: pain level controlled Vital Signs Assessment: post-procedure vital signs reviewed and stable Respiratory status: spontaneous breathing, nonlabored ventilation, respiratory function stable and patient connected to nasal cannula oxygen Cardiovascular status: blood pressure returned to baseline and stable Postop Assessment: no apparent nausea or vomiting Anesthetic complications: no   No notable events documented.  Last Vitals:  Vitals:   06/28/24 1303 06/28/24 1322  BP: 136/82 131/80  Pulse: (!) 56 68  Resp: 15 16  Temp:  36.6 C  SpO2: 94% 100%    Last Pain:  Vitals:   06/28/24 1322  TempSrc:   PainSc: 0-No pain                 [name]      "

## 2024-07-04 ENCOUNTER — Other Ambulatory Visit: Payer: Self-pay | Admitting: *Deleted

## 2024-07-04 NOTE — Transitions of Care (Post Inpatient/ED Visit) (Signed)
 " Transition of Care week # 5/ day # 26 TOC 30-day program case closure  Visit Note  07/04/2024  Name: Christopher [name] MRN: [id number]          DOB: June 30, 1951  Situation: Patient enrolled in [facility] 30-day program. Visit completed with patient by telephone.   HIPAA identifiers x 2 verified  Background:  Lives alone, limited social/ family support; independent in self-care activities; no assistive devices at baseline; drives self Needs education- reinforcement around self- management of multiple chronic health conditions; medications; need for adherence to care plan: continues to adamantly declined home daily weight/ blood pressure monitoring; monitors blood sugars every day fasting Recent hospitalization November 26- June 08, 2024 for AKI/ kidney stone; Acute on Chronic CHF (2) unplanned hospitalizations x last (12) months; (1) x last 6 months  07/04/24: TOC 30--day outreach completed; patient has successfully met/ accomplished previously established goals for TOC 30-day program without hospital readmission and referral was sent for ongoing follow up with longitudinal RN CM   Initial Transition Care Management Follow-up Telephone Call Discharge Date and Diagnosis: 06/08/24, AKI: (R) kidney stone with uretal stent placement; Acute on chronic CHF with cardiac catherization   Past Medical History:  Diagnosis Date   Arthritis    Atrial fibrillation (HCC)    post op, intol of anticoag   Cardiac arrest (HCC)    a. s/p MDT single chamber ICD; 11-10-18- pt denies having a heart attack   Cataract    CHF (congestive heart failure) (HCC)    Chronic kidney disease    kidney stone   Coronary artery disease    a. 2/7 Cath: LM nl, LAD min irregs, LCX min irregs, RI 40, RCA 21m, EF 55-60% basal to mid inf HK, 3-4+ MR;  b. 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 Mitral valve prolapse with flail segment of posterior leaflet and severe MR by TEE, remote h/o bacterial endocarditis    Sleep apnea    NPSG 01/21/06- AHI 40.7/hr cpap   Stroke [facility])    summer 2022- one messed up vision, the other balance   Subacute bacterial endocarditis 03/22/2008   Strep viridans   Ventricular fibrillation (HCC) 11/07/2019   appropriate shock (36J) for VF delivered   Assessment:  I am doing okay after the procedure on Christmas eve- I didn't have to stay overnight at the hospital and got to go home on the same day; I haven't had any problems and they are going to take the stent out tomorrow as far as I know- I have to be there at 2:45 pm; I will call them to find out if I am supposed to fast before the procedure.  My blood sugars are staying above 100, and I am still holding the blood thinner- will see Dr. Mona on Monday and he will tell me if he wants me to start them back up, depending on how everything goes tomorrow    Denies  clinical concerns and sounds to be in no distress throughout TOC 30-day program outreach call today  Patient Reported Symptoms:  Cognitive Cognitive Status: Normal speech and language skills, Alert and oriented to person, place, and time, Insightful and able to interpret abstract concepts, Difficulties with attention and concentration Cognitive/Intellectual Conditions Management [RPT]: None reported or documented in medical history or problem list      Neurological Neurological Review of Symptoms: No symptoms reported    HEENT HEENT Symptoms Reported: No symptoms reported       Cardiovascular Cardiovascular Symptoms Reported: No symptoms reported Other Cardiovascular Symptoms: Continues to adamantly decline self-willingness to monitor/ record blood pressures and daily weights at home Does patient have uncontrolled Hypertension?: No Cardiovascular Management Strategies: Medication therapy, Adequate rest, Coping strategies, Routine screening  Respiratory Respiratory Symptoms Reported: No symptoms reported Other Respiratory Symptoms: Denies shortness of breath and aounds to be in no respiratory distress throughout [facility] call    Endocrine Endocrine Symptoms Reported: No symptoms reported Is patient diabetic?: Yes Is patient checking blood sugars at home?: Yes List most recent blood sugar readings, include date and time of day: Blood sugars are still running over 100- today it was 112; I have not had any more really low numbers for awhile    Gastrointestinal Gastrointestinal Symptoms Reported: No symptoms reported      Genitourinary Genitourinary Symptoms Reported: No symptoms reported Additional Genitourinary Details: Reports no bleeding after recent urology procedure: lithotrypsy with stent exchange; my pee is clear yellow; reports had a little bleeding the first day, but it went away within a couple of hours; confirms plans to attend 07/05/24 stent removal procedure as scheduled Genitourinary Management Strategies: Coping strategies  Integumentary Integumentary Symptoms Reported: No symptoms reported    Musculoskeletal Musculoskelatal Symptoms Reviewed: No symptoms reported        Psychosocial Psychosocial Symptoms Reported: No symptoms reported         There were no vitals filed for this visit. Pain Scale: 0-10 Pain Score: 0-No pain  Medications Reviewed Today     Reviewed by Dietra Stokely M, RN (Registered Nurse) on 07/04/24 at 1400  Med List Status: <None>   Medication Order Taking? Sig Documenting Provider Last Dose Status Informant   Accu-Chek Softclix Lancets lancets [id number]  1 each by Other route See admin instructions. Use TID [name], MD  Active Self  acetaminophen  (TYLENOL ) 500 MG tablet [id number] No Take 1,000 mg by mouth every 6 (six) hours as needed for mild pain (pain score 1-3) or headache. [provider] Unknown Active Self  amiodarone  (PACERONE ) 200 MG tablet [id number] No Take 1 tablet (200 mg total) by mouth 2 (two) times daily for 30 days, THEN 1 tablet (200 mg total) daily. Inocencio Soyla Lunger, MD 06/28/2024  7:00 AM Active Self  atorvastatin  (LIPITOR) 40 MG tablet [id number] No Take 1 tablet (40 mg total) by mouth daily. [name], MD 06/27/2024 Active Self  cetirizine (ZYRTEC) 10 MG tablet [id number] No Take 10 mg by mouth daily. [provider] 06/27/2024 Active Self  cholecalciferol  (VITAMIN D ) 25 MCG (1000 UNIT) tablet [id number] No Take 1,000 Units by mouth daily. [provider] 06/21/2024 Active Self  Choline Fenofibrate  (FENOFIBRIC ACID ) 45 MG CPDR [id number] No Take 1 tablet by mouth daily. Mona Vinie BROCKS, MD Past Week Active Self  dapagliflozin  propanediol (FARXIGA ) 10 MG TABS tablet [id number] No Take 1 tablet (10 mg total) by mouth daily. [name], MD 06/24/2024 Active Self  fluticasone  (FLONASE ) 50  MCG/ACT nasal spray [id number]  Place 2 sprays into both nostrils daily.  Patient taking differently: Place 2 sprays into both nostrils daily as needed for allergies.   [name], MD  Active Self  furosemide  (LASIX ) 80 MG tablet [id number] No TAKE 1 TABLET BY MOUTH EVERY DAY Mona Vinie BROCKS, MD 06/27/2024 Active   glucose blood (ACCU-CHEK GUIDE TEST) test strip [id number]  Use as instructed [name], MD  Active Self  HYDROcodone -acetaminophen  (NORCO/VICODIN) 5-325 MG tablet [id number]  Take 1 tablet by mouth every 6 (six) hours as needed. Carolee Sherwood JONETTA DOUGLAS, MD  Active   Insulin  Lispro Prot & Lispro (HUMALOG  MIX 75/25  KWIKPEN) (75-25) 100 UNIT/ML Kary [id number] No Inject 18 Units into the skin in the morning and at bedtime. [name], MD 06/27/2024 Active Self           Med Note ALAIN, DUANA R   Wed Jun 28, 2024  9:47 AM) 12 UNITS   metFORMIN  (GLUCOPHAGE ) 500 MG tablet [id number] No Take 1 tablet (500 mg total) by mouth 2 (two) times daily with a meal. Kathrin Mignon DASEN, MD 06/27/2024 Active Self  metoprolol  succinate (TOPROL -XL) 100 MG 24 hr tablet [id number] No Take 1 tablet (100 mg total) by mouth daily. [name], MD 06/28/2024  7:00 AM Active Self  naphazoline-pheniramine (NAPHCON-A) 0.025-0.3 % ophthalmic solution [id number] No Place 2 drops into both eyes in the morning. [provider] Past Week Active Self  OVER THE COUNTER MEDICATION [id number] No Take 1 tablet by mouth in the morning and at bedtime. [facility] - Restore multivitamin [provider] Past Week Active Self  spironolactone  (ALDACTONE ) 25 MG tablet [id number] No TAKE 1 TABLET BY MOUTH DAILY Hilty, Vinie BROCKS, MD Past Week Active Self  tamsulosin  (FLOMAX ) 0.4 MG CAPS capsule [id number] No Take 1 capsule (0.4 mg total) by mouth daily. [name], MD Past Week Active Self           Recommendation:   Specialty provider follow-up: 07/05/24- urology provider for (hopeful) uretal stent removal (placed as outpatient on 06/28/24); 07/11/23: cardiology provider Continue Current Plan of Care  Follow Up Plan:   Referral to RN Case Manager Closing From:  Transitions of Care Program: case closure during TOC week # 5- as per leadership directive  Pls call/ message for questions,  Beatris Blinda Lawrence, RN, BSN, CCRN Alumnus RN Care Manager  Transitions of Care  [facility] - [facility] [phone]: direct office     "

## 2024-07-04 NOTE — Patient Instructions (Signed)
 Visit Information  Thank you for taking time to visit with me today. Please don't hesitate to contact me if I can be of assistance to you before our next scheduled telephone appointment.  It has been a pleasure working with you over the last 30 days!  Please listen for a call from the scheduling care guide to schedule a phone call with the new nurse care manager who will pick up in your care where we are leaving off today  Following are the goals we discussed today:  Patient Self Care Activities:  Attend all scheduled provider appointments Call provider office for new concerns or questions  Participate in Transition of Care Program/Attend TOC scheduled calls Take medications as prescribed   use salt in moderation watch for swelling in feet, ankles and legs every day know when to call the doctor:for shortness of breath that is increased from your normal breathing; for increased swelling in your legs- ankles- feet or abdomen check blood sugar at prescribed times: once daily and when you have symptoms of low or high blood sugar take the blood sugar log to all doctor visits Continue pacing activity to avoid episodes of shortness of breath Continue monitoring and recording your blood sugars at home Use assistive devices as needed to prevent falls- walker / cane as needed Eat a heart healthy and low salt diet Please go over all of your medications and medication question/ concerns with your care providers (doctor) If you believe your condition is getting worse- contact your care providers (doctors) promptly- reaching out to your doctor early when you have concerns can prevent you from having to go to the hospital  If you are experiencing a Mental Health or Behavioral Health Crisis or need someone to talk to, please  call the Suicide and Crisis Lifeline: 988 call the [place]  National Suicide Prevention Lifeline: 224-070-6105 or TTY: 605-687-0140 TTY (631)037-3127) to talk to a trained  counselor call 1-800-273-TALK (toll free, 24 hour hotline) go to [facility] [address], [place] [phone]) call the [facility] Crisis Line: [phone] call 911   Care plan and visit instructions communicated with the patient verbally today. Patient agrees to receive a copy in MyChart. Active MyChart status and patient understanding of how to access instructions and care plan via MyChart confirmed with patient.     Nazaire Cordial Mckinney Treven Holtman, RN, BSN, Media Planner  Transitions of Care  [facility] - [facility] [phone]: direct office

## 2024-07-07 ENCOUNTER — Other Ambulatory Visit: Payer: Self-pay

## 2024-07-07 MED ORDER — FUROSEMIDE 80 MG PO TABS: 80.0000 mg | ORAL_TABLET | Freq: Every day | ORAL | 0 refills | Status: AC

## 2024-07-07 NOTE — Addendum Note (Signed)
 Addended by: Danyal Whitenack L on: 07/07/2024 02:40 PM   Modules accepted: Level of Service

## 2024-07-10 ENCOUNTER — Ambulatory Visit (INDEPENDENT_AMBULATORY_CARE_PROVIDER_SITE_OTHER)

## 2024-07-10 ENCOUNTER — Ambulatory Visit: Admitting: Internal Medicine

## 2024-07-10 ENCOUNTER — Encounter: Payer: Self-pay | Admitting: Internal Medicine

## 2024-07-10 VITALS — BP 104/72 | HR 72 | Ht 70.0 in | Wt 225.0 lb

## 2024-07-10 DIAGNOSIS — Z95810 Presence of automatic (implantable) cardiac defibrillator: Secondary | ICD-10-CM

## 2024-07-10 DIAGNOSIS — I4819 Other persistent atrial fibrillation: Secondary | ICD-10-CM | POA: Diagnosis not present

## 2024-07-10 DIAGNOSIS — I5021 Acute systolic (congestive) heart failure: Secondary | ICD-10-CM

## 2024-07-10 DIAGNOSIS — R310 Gross hematuria: Secondary | ICD-10-CM

## 2024-07-10 DIAGNOSIS — I428 Other cardiomyopathies: Secondary | ICD-10-CM

## 2024-07-10 DIAGNOSIS — I5022 Chronic systolic (congestive) heart failure: Secondary | ICD-10-CM

## 2024-07-10 NOTE — Progress Notes (Signed)
  Received: Today Hilty, Lisette Abu, MD  Elizabelle Fite, Josephine Igo, RN Thanks

## 2024-07-10 NOTE — Progress Notes (Signed)
 See Dr [facility] office notes for recommendations given.

## 2024-07-10 NOTE — Progress Notes (Signed)
 "   OFFICE NOTE  Chief Complaint:  Follow-up   Primary Care Physician: [name], MD  HPI:  Christopher [name] is a 73 y.o. 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  Subsequently he had cardiac arrest and had a single-chamber ICD placed in 2018 due to nonischemic cardiomyopathy.  He has had a history of atrial fibrillation, hypertension dyslipidemia and sleep apnea.  He is followed by Dr. Kelsie for his ICD.  At one point his LVEF had improved up to 55 to 60% on medical therapy however this past summer he had a pontine stroke associated diplopia.  Repeat echocardiogram showed a newly reduced EF 30 to 35% with anterior apical and inferoapical wall motion abnormality suggestive of Takatsubo versus LAD territory ischemia.  Given recent stroke ischemic evaluation was not performed.  He had not been complaining of chest pain.  Subsequently followed up with Hao Meng, PA-C, who has been increasing his heart failure medicine regimen, titrating his Entresto  up to the highest dose and adding Farxiga  in addition to spironolactone , beta-blocker and other heart failure therapies.  Repeat echocardiogram was performed in November which showed improvement in LVEF up to 40 to 45% however his EF is not normalized.  He denies any anginal symptoms but does get some mild shortness of breath with exertion.  12/29/2021  [name] returns today for follow-up.  Please report his LVEF has improved further slightly up to 45 to 50%.  The mitral valve gradients suggest some mild narrowing of the mitral valve annuloplasty without severe stenosis.  He did enroll in a clinical research trial for elevated triglycerides.  He was given a coronary CT scan as part of that study which did result in being abnormal suggesting a positive FFR and possible occlusion proximal to the known  RCA stent.  Despite this finding he is completely asymptomatic.  He denies chest pain or worsening shortness of breath.  Since this study was not performed for symptoms of angina, I am less inclined to need to follow this up.  He is also asking today about Viagra .  06/25/2022  [name] is seen today in follow-up.  He is maintained in the clinical research trial to lower triglycerides.  He seems to have had no issues with that.  Continues to have remote defibrillator checks which show no issues.  In June showed stable LVEF at 45 to 50% with a stable mitral valve gradient.  He did have a CT with FFR at that time as part of his clinical research trial.  This suggested some obstructive CAD proximal to the RCA stent however he has been asymptomatic.  Since this was only performed as part of the clinical research trial and he was asymptomatic we did not pursue any therapy.  It was felt that the FFR may be spurious because of the stent.  He reports he still does not have any anginal symptoms.  04/30/2023  [name] returns today for follow-up.  He has completed the clinical research trial regarding high triglycerides.  He did have some subsequent labs which showed marked reduction in triglycerides which were recently normal at 75 with an LDL of 60.  Unfortunately he is no longer on therapy.  As per the research trial as above he had a CT scan which showed probable proximal occlusion of the RCA stent however there are  collaterals that he is asymptomatic without chest pain.  Blood pressure was normal today.  03/03/2024  [name] returns today for follow-up of heart failure.  Earlier this year he had a repeat echo which showed a decline in LVEF down to 30 to 35%.  Subsequently was sent for left heart catheterization which showed no significant change in his coronaries with mild nonobstructive coronary disease.  LVEDP was 14 mmHg.  He had a subsequent follow-up with Dr. Inocencio.  He was found to have some  frequent PVCs.  He underwent a 2-week monitor which showed a burden of PVCs of 14%.  He was advised to start mexiletine 250 mg twice a day with the thought that this might be causing a PVC related cardiomyopathy.  He reports he is feeling fairly well although had a fall the other day when coming out of a bar.  He said he had only had 1 drink and did not lose consciousness.  He does have a lot of scabbing on his right knee and right shin.  He had some significant worsening of his renal function earlier this year.  A bit repeat metabolic profile was ordered but never obtained.  I noted that he is on too high of a dose of fenofibrate  given his decreased GFR and will need to reduce that.  06/16/2024  Christopher [name] is seen today for follow-up.  He was recently hospitalized between November 26 and June 08, 2024.  He presented with several weeks of malaise, fatigue, nausea and cough.  He felt that this might be due to mexiletine which was recently started for frequent PVCs.  This was discontinued by cardiac EP and he transitioned to amiodarone .  In the ER he was found to be in acute renal failure with a creatinine of 3.1.  Chest x-ray showed evidence of volume overload.  He was found on CT to have an incidental 9 mm stone in the right mid ureter.  Urology was consulted and they placed a right ureteral stent.  There is plan for stent removal procedure on December 24.  He was found to be in atrial fibrillation which was persistent during this hospitalization.  I continued him on amiodarone  and he was diuresed.  A right heart catheter was performed which showed mild pulmonary hypertension and mildly reduced cardiac output with cardiac output of 5.01 and cardiac index of 2.23.  He had been noticed to have some trace hematuria.  He was also noted to be hypotensive during that admission.  Entresto  was discontinued.  Since discharge he reports over the past 3 days he has had more significant hematuria.  Today in the office he  appears pale and is again hypotensive.  Blood pressure was 96/42.  He is on Plavix  and Eliquis  which was started because of plans for possible cardioversion.  Overall he says he feels unwell.  07/10/2024  [name] is seen today in follow-up.  He now says he is actually feeling somewhat better.  He underwent ureteroscopy by Dr. Carolee on Christmas Eve and had stent placement.  He did have some follow-up at [facility]rology.  He cannot tell me that he restarted his Eliquis  yet but at this point he needs to.  He denies any urinary bleeding.  He remains in A-fib.  Device interrogation now shows that he has been in persistent A-fib since November and has high OptiVol readings.  He can also not tell me reliably if he is taking furosemide  but said he did stop spironolactone   after we told him to do that in the hospital as well as Entresto .  His blood pressure remains low at 104 systolic and therefore I do not advise him to restart Entresto  but I do think he could restart his spironolactone  and certainly should be on furosemide .  He is also on amiodarone  200 mg daily.  He had recent lab works which does show some mild anemia.  Creatinine baseline is about 2.6-2.7.  I have permanently recommended stopping fenofibrate  due to worsening renal function.  PMHx:  Past Medical History:  Diagnosis Date   Arthritis    Atrial fibrillation (HCC)    post op, intol of anticoag   Cardiac arrest (HCC)    a. s/p MDT single chamber ICD; 11-10-18- pt denies having a heart attack   Cataract    CHF (congestive heart failure) (HCC)    Chronic kidney disease    kidney stone   Coronary artery disease    a. 2/7 Cath: LM nl, LAD min irregs, LCX min irregs, RI 40, RCA 65m, EF 55-60% basal to mid inf HK, 3-4+ MR;  b. 08/25/2012 PCI of RCA with 4.0x15 Vision BMS   Diabetes mellitus without complication (HCC)    GERD (gastroesophageal reflux disease)    hx   Heart murmur    Hyperlipidemia    on statin   Hypertension    Myocardial  infarction (HCC)    PONV (postoperative nausea and vomiting)    S/P mitral valve repair 09/27/2012   Complex valvuloplasty including triangular resection of flail posterior leaflet with 30 mm Sorin Memo 3D ring annuloplasty via right mini thoracotomy approach   seasonal allergies 09/27/2008   Severe mitral regurgitation    a. Mitral valve prolapse with flail segment of posterior leaflet and severe MR by TEE, remote h/o bacterial endocarditis    Sleep apnea    NPSG 01/21/06- AHI 40.7/hr cpap   Stroke [facility])    summer 2022- one messed up vision, the other balance   Subacute bacterial endocarditis 03/22/2008   Strep viridans   Ventricular fibrillation (HCC) 11/07/2019   appropriate shock (36J) for VF delivered    Past Surgical History:  Procedure Laterality Date   AMPUTATION  07/28/2012   Procedure: AMPUTATION DIGIT;  Surgeon: Deward DELENA Schwartz, MD;  Location: [facility] ORS;  Service: Orthopedics;  Laterality: Right;  2nd toe   CARDIAC CATHETERIZATION     COLONOSCOPY     CYSTOSCOPY W/ URETERAL STENT PLACEMENT Right 06/03/2024   Procedure: CYSTOSCOPY, WITH RETROGRADE PYELOGRAM AND URETERAL STENT INSERTION;  Surgeon: Carolee Sherwood JONETTA DOUGLAS, MD;  Location: [facility] OR;  Service: Urology;  Laterality: Right;   CYSTOSCOPY/URETEROSCOPY/HOLMIUM LASER/STENT PLACEMENT Right 06/28/2024   Procedure: CYSTOSCOPY/URETEROSCOPY/HOLMIUM LASER/STENT PLACEMENT;  Surgeon: Carolee Sherwood JONETTA DOUGLAS, MD;  Location: [facility] ORS;  Service: Urology;  Laterality: Right;   EP IMPLANTABLE DEVICE N/A 05/01/2016   Procedure: Loop Recorder Removal;  Surgeon: Lynwood Rakers, MD;  Location: [facility] INVASIVE CV LAB;  Service: Cardiovascular;  Laterality: N/A;   FOOT SURGERY     BILATERAL TOES   ICD IMPLANT N/A 05/07/2017    Medtronic Visia AF MRI VR SureScan implanted by Dr Inocencio following VF arrest   Implantable loop recorder placement  03/31/2013   MDT Linq implanted by Dr Rakers to evaluate for further afib   INTRAOPERATIVE TRANSESOPHAGEAL  ECHOCARDIOGRAM N/A 09/27/2012   Procedure: INTRAOPERATIVE TRANSESOPHAGEAL ECHOCARDIOGRAM;  Surgeon: Sudie VEAR Laine, MD;  Location: [facility] OR;  Service: Open Heart Surgery;  Laterality: N/A;   LEFT AND  RIGHT HEART CATHETERIZATION WITH CORONARY ANGIOGRAM N/A 08/12/2012   Procedure: LEFT AND RIGHT HEART CATHETERIZATION WITH CORONARY ANGIOGRAM;  Surgeon: Ezra GORMAN Shuck, MD;  Location: [facility] CATH LAB;  Service: Cardiovascular;  Laterality: N/A;   LEFT HEART CATH AND CORONARY ANGIOGRAPHY N/A 05/06/2017   Procedure: LEFT HEART CATH AND CORONARY ANGIOGRAPHY;  Surgeon: Anner Alm ORN, MD;  Location: [facility] INVASIVE CV LAB;  Service: Cardiovascular;  Laterality: N/A;   LEFT HEART CATH AND CORONARY ANGIOGRAPHY N/A 08/12/2023   Procedure: LEFT HEART CATH AND CORONARY ANGIOGRAPHY;  Surgeon: Jordan, Peter M, MD;  Location: [facility] INVASIVE CV LAB;  Service: Cardiovascular;  Laterality: N/A;   LOOP RECORDER IMPLANT N/A 03/31/2013   Procedure: LOOP RECORDER IMPLANT;  Surgeon: Lynwood JONETTA Rakers, MD;  Location: [facility] CATH LAB;  Service: Cardiovascular;  Laterality: N/A;   MITRAL VALVE REPAIR Right 09/27/2012   Procedure: MINIMALLY INVASIVE MITRAL VALVE REPAIR (MVR);  Surgeon: Sudie VEAR Laine, MD;  Location: [facility] OR;  Service: Open Heart Surgery;  Laterality: Right;  Ultrasound guided   PERCUTANEOUS CORONARY STENT INTERVENTION (PCI-S) N/A 08/25/2012   Procedure: PERCUTANEOUS CORONARY STENT INTERVENTION (PCI-S);  Surgeon: Ozell Fell, MD;  Location: [facility] CATH LAB;  Service: Cardiovascular;  Laterality: N/A;   POLYPECTOMY     RIGHT HEART CATH N/A 06/05/2024   Procedure: RIGHT HEART CATH;  Surgeon: Anner Alm ORN, MD;  Location: [facility] INVASIVE CV LAB;  Service: Cardiovascular;  Laterality: N/A;   SEPTOPLASTY     Dr. Floy   TEE WITHOUT CARDIOVERSION N/A 08/12/2012   Procedure: TRANSESOPHAGEAL ECHOCARDIOGRAM (TEE);  Surgeon: Ezra GORMAN Shuck, MD;  Location: [facility] ENDOSCOPY;  Service: Cardiovascular;  Laterality: N/A;   TONSILLECTOMY      UVULOPALATOPHARYNGOPLASTY      FAMHx:  Family History  Problem Relation Age of Onset   Diabetes Mother    Stroke Mother    Heart disease Father    Colon cancer Father 32   Diabetes Father    Renal cancer Father    Cancer Father    Sudden death Brother    Heart attack Brother    Esophageal cancer Neg Hx    Rectal cancer Neg Hx    Stomach cancer Neg Hx    Colon polyps Neg Hx     SOCHx:   reports that he quit smoking about 46 years ago. His smoking use included cigarettes. He started smoking about 51 years ago. He has a 12.5 pack-year smoking history. He has never used smokeless tobacco. He reports current alcohol use. He reports that he does not use drugs.  ALLERGIES:  Allergies  Allergen Reactions   Codeine Other (See Comments)    Causes bad constipation   Dust Mite Extract Cough   Pollen Extract Cough    ROS: Pertinent items noted in HPI and remainder of comprehensive ROS otherwise negative.  HOME MEDS: Current Outpatient Medications on File Prior to Visit  Medication Sig Dispense Refill   Accu-Chek Softclix Lancets lancets 1 each by Other route See admin instructions. Use TID 100 each 12   acetaminophen  (TYLENOL ) 500 MG tablet Take 1,000 mg by mouth every 6 (six) hours as needed for mild pain (pain score 1-3) or headache.     amiodarone  (PACERONE ) 200 MG tablet Take 1 tablet (200 mg total) by mouth 2 (two) times daily for 30 days, THEN 1 tablet (200 mg total) daily. 110 tablet 3   atorvastatin  (LIPITOR) 40 MG tablet Take 1 tablet (40 mg total) by mouth daily. 100 tablet 3  cetirizine (ZYRTEC) 10 MG tablet Take 10 mg by mouth daily.     cholecalciferol  (VITAMIN D ) 25 MCG (1000 UNIT) tablet Take 1,000 Units by mouth daily.     Choline Fenofibrate  (FENOFIBRIC ACID ) 45 MG CPDR Take 1 tablet by mouth daily. 90 capsule 3   dapagliflozin  propanediol (FARXIGA ) 10 MG TABS tablet Take 1 tablet (10 mg total) by mouth daily. 100 tablet 3   fluticasone  (FLONASE ) 50 MCG/ACT nasal  spray Place 2 sprays into both nostrils daily. (Patient taking differently: Place 2 sprays into both nostrils daily as needed for allergies.) 64 g 3   furosemide  (LASIX ) 80 MG tablet Take 1 tablet (80 mg total) by mouth daily. 90 tablet 0   glucose blood (ACCU-CHEK GUIDE TEST) test strip Use as instructed 100 each 12   HYDROcodone -acetaminophen  (NORCO/VICODIN) 5-325 MG tablet Take 1 tablet by mouth every 6 (six) hours as needed. 10 tablet 0   Insulin  Lispro Prot & Lispro (HUMALOG  MIX 75/25 KWIKPEN) (75-25) 100 UNIT/ML Kwikpen Inject 18 Units into the skin in the morning and at bedtime. 45 mL 3   metFORMIN  (GLUCOPHAGE ) 500 MG tablet Take 1 tablet (500 mg total) by mouth 2 (two) times daily with a meal. 180 tablet 0   metoprolol  succinate (TOPROL -XL) 100 MG 24 hr tablet Take 1 tablet (100 mg total) by mouth daily. 100 tablet 3   naphazoline-pheniramine (NAPHCON-A) 0.025-0.3 % ophthalmic solution Place 2 drops into both eyes in the morning.     OVER THE COUNTER MEDICATION Take 1 tablet by mouth in the morning and at bedtime. [facility] - Restore multivitamin     spironolactone  (ALDACTONE ) 25 MG tablet TAKE 1 TABLET BY MOUTH DAILY 100 tablet 2   tamsulosin  (FLOMAX ) 0.4 MG CAPS capsule Take 1 capsule (0.4 mg total) by mouth daily. 100 capsule 3   No current facility-administered medications on file prior to visit.    LABS/IMAGING: No results found. However, due to the size of the patient record, not all encounters were searched. Please check Results Review for a complete set of results. No results found.  LIPID PANEL:    Component Value Date/Time   CHOL 163 11/30/2023 1328   CHOL 120 12/23/2021 1502   TRIG 273.0 (H) 11/30/2023 1328   HDL 42.90 11/30/2023 1328   HDL 38 (L) 12/23/2021 1502   CHOLHDL 4 11/30/2023 1328   VLDL 54.6 (H) 11/30/2023 1328   LDLCALC 66 11/30/2023 1328   LDLCALC 60 12/23/2021 1502   LDLDIRECT 52.0 07/18/2019 1454     WEIGHTS: Wt Readings from Last 3 Encounters:   07/10/24 225 lb (102.1 kg)  06/28/24 230 lb (104.3 kg)  06/21/24 230 lb (104.3 kg)    VITALS: BP 104/72 (BP Location: Left Arm)   Pulse 72   Ht 5' 10 (1.778 m)   Wt 225 lb (102.1 kg)   SpO2 97%   BMI 32.28 kg/m   EXAM: General appearance: alert, fatigued, and pale Neck: no carotid bruit, no JVD, and thyroid  not enlarged, symmetric, no tenderness/mass/nodules Lungs: clear to auscultation bilaterally Heart: irregularly irregular rhythm Abdomen: soft, non-tender; bowel sounds normal; no masses,  no organomegaly Extremities: extremities normal, atraumatic, no cyanosis or edema Pulses: 2+ and symmetric Skin: Pale, warm, dry, scabbing over the right knee and anterior shin Neurologic: Grossly normal Psych: Appears fatigued, frustrated  EKG: Deferred  ASSESSMENT: Persistent atrial fibrillation/flutter Right renal stone status post ureteral stent to Intermittent GU bleeding CAD status post PCI to the RCA in 2014 Recent  acute on chronic systolic heart failure, LVEF 30 to 35% (08/2023), NYHA class II symptoms Coronary CTA suggested possible obstruction proximal to the right coronary artery stent (12/2021)-asymptomatic -cardiac catheter revealed mild nonobstructive coronary disease (08/2023) Cardiac arrest in 2018 due to nonischemic cardiomyopathy status post AICD History of subacute bacterial endocarditis status post mitral valve repair History of stroke in 02/2021 Hypertension Dyslipidemia OSA on CPAP Erectile dysfunction  PLAN: 1.   Christopher [name] says he feels much better after his recent urologic procedure.  He remains in atrial fibrillation which has been persistent since November with high OptiVol readings.  I am not sure he has been compliant with his furosemide  or spironolactone  as a number of medications were stopped during his recent hospitalization due to hypotension.  Blood pressure would not support Entresto  at this point by advised him to restart Lasix  80 mg daily and  spironolactone  25 mg daily.  He should not be taking any fenofibrate .  Will go ahead and repeat labs including a metabolic profile and BNP in about a week.  He needs to restart Eliquis  5 mg twice daily and will schedule a follow-up in about 3 to 4 weeks at which time an EKG will be performed and if he remains in atrial fibrillation he should be scheduled for elective cardioversion.  Follow-up with me otherwise afterwards.  Vinie KYM Maxcy, MD, [facility], FNLA, FACP  [place]  [facility]  Medical Director of the Advanced Lipid Disorders &  Cardiovascular Risk Reduction Clinic Diplomate of the American Board of Clinical Lipidology Attending Cardiologist  Direct Dial: [phone]  Fax: [phone]  Website:  [email or web address] Vinie BROCKS Joane Postel 07/10/2024, 1:34 PM "

## 2024-07-10 NOTE — Progress Notes (Signed)
 EPIC Encounter for ICM Monitoring  Patient Name: Christopher [name] is a 74 y.o. male Date: 07/10/2024 Primary Care Physican: [name], MD Primary Cardiologist: [facility] Electrophysiologist: Inocencio 09/08/2023 Weight: 226 lbs (has not been weighing daily) 09/21/2023 Office Weight: 225 lbs 10/19/2023 Office Weight: 227 lbs 12/24/2023 Weight: 227 lbs 03/03/2024 Office Weight: 229 lbs 04/28/2024 Weight: 228-229 lbs 05/30/2024 Weight: 229 lbs   Since 29-May-2024 VT-NS (>4 beats, >200 bpm)  1 Time in AF  24.0 hr/day (100.0%)   (AT/AF Burden has increased from 2.2% on 05/15/2024)   Transmission results reviewed.    Since 04/24/2024 ICM Remote Transmission: Optivol thoracic impedance suggesting possible fluid accumulation starting 05/14/2024.  Possible fluid accumulation appears to correlate with AT/AF starting 05/14/2024.   Message sent to device clinic triage regarding AT/AF Burden.    Prescribed:  Furosemide  80 mg take 1 tablet (80 mg total) by mouth daily.    Spironolactone  25 mg take 1 tablet (25 mg total) daily   Labs: 06/16/2024 Creatinine 2.67, BUN 30, Potassium 4.0, Sodium 138  06/08/2024 Creatinine 2.72, BUN 30, Potassium 3.8, Sodium 136, GFR 24  06/07/2024 Creatinine 2.51, BUN 29, Potassium 4.2, Sodium 137, GFR 26  06/06/2025 Creatinine 2.70, BUN 33, Potassium 4.6, Sodium 139, GFR 24 A complete set of results can be found in Results Review.   Recommendations:  Sent to Dr Mona for review at todays office visit, 07/10/2024.    Follow-up plan: ICM clinic phone appointment on 07/18/2024 to recheck fluid levels.  Next 31 day visit due 08/15/2024.   91 day device clinic remote transmission 08/14/2024.      EP/Cardiology Office Visits:   07/10/2024 with Dr Mona.  Recall 06/11/2024 with Dr Inocencio.   Copy of ICM check sent to Dr. Inocencio.   Remote monitoring is medically necessary for Heart Failure Management.    Daily Thoracic Impedance ICM trend: 04/10/2024 through  07/10/2024.    12-14 Month Thoracic Impedance ICM trend:     [name], RN 07/10/2024 10:30 AM

## 2024-07-10 NOTE — Patient Instructions (Signed)
 Medication Instructions:   Please take all medication as prescribed Eliquis  5mg  twice daily - blood thinner Amiodarone  200mg  daily - heart rhythm control Furosemide  80mg  daily - fluid Spironolactone  25mg  daily - fluid, heart failure Metoprolol  succinate 100mg  daily - heart rate, BP, helps heart not work as hard Atorvastatin  40mg  daily - cholesterol All other non-cardiac medications as listed  Per Dr. Mona, do not take fenofibrate  (removed from med list today)  *If you need a refill on your cardiac medications before your next appointment, please call your pharmacy*  Lab Work:  BMET and BNP in 1 week -- non-fasting  If you have labs (blood work) drawn today and your tests are completely normal, you will receive your results only by: MyChart Message (if you have MyChart) OR A paper copy in the mail If you have any lab test that is abnormal or we need to change your treatment, we will call you to review the results.   Follow-Up: At [facility], you and your health needs are our priority.  As part of our continuing mission to provide you with exceptional heart care, our providers are all part of one team.  This team includes your primary Cardiologist (physician) and Advanced Practice Providers or APPs (Physician Assistants and Nurse Practitioners) who all work together to provide you with the care you need, when you need it.  Your next appointment:    3-4 weeks with PA or NP with EKG  We recommend signing up for the patient portal called MyChart.  Sign up information is provided on this After Visit Summary.  MyChart is used to connect with patients for Virtual Visits (Telemedicine).  Patients are able to view lab/test results, encounter notes, upcoming appointments, etc.  Non-urgent messages can be sent to your provider as well.   To learn more about what you can do with MyChart, go to [email or web address].   Other Instructions

## 2024-07-11 NOTE — Progress Notes (Signed)
 Message received from device clinic.   Received: Today Christopher Rozelle SAUNDERS, RN  Haneefah Venturini, Mitzie RAMAN, RN I will reach out to scheduling and get him in next available since he is overdue. Dr. Mona also reviewed his Optivol and AF in his appointment yesterday as well which is great. Thanks for letting us  know!

## 2024-07-13 ENCOUNTER — Telehealth: Payer: Self-pay | Admitting: Internal Medicine

## 2024-07-13 ENCOUNTER — Other Ambulatory Visit: Payer: Self-pay

## 2024-07-13 DIAGNOSIS — I4819 Other persistent atrial fibrillation: Secondary | ICD-10-CM

## 2024-07-13 NOTE — Telephone Encounter (Signed)
" °*  STAT* If patient is at the pharmacy, call can be transferred to refill team.   1. Which medications need to be refilled? (please list name of each medication and dose if known)   apixaban  (ELIQUIS ) 5 MG TABS tablet  metFORMIN  (GLUCOPHAGE ) 500 MG tablet   2. Would you like to learn more about the convenience, safety, & potential cost savings by using the [facility]? no   3. Are you open to using the [facility] (Type [facility]. no   4. Which pharmacy/location (including street and city if local pharmacy) is medication to be sent to?   [facility] - [place], [place] - [address]   5. Do they need a 30 day or 90 day supply?   "

## 2024-07-13 NOTE — Telephone Encounter (Signed)
 Christopher [name], [place]    07/13/24  3:46 PM Note Prescription refill request for Eliquis  received. Indication: A-Fib Last office visit: 07/10/24 Scr: 2.62 06/16/24 Care Everywhere Age: 74 Weight: 102.1 KG Pt has Passed Parameters Not Comfortable with Creatine level      Will route to pharmacist to advise.

## 2024-07-13 NOTE — Telephone Encounter (Signed)
 Prescription refill request for Eliquis  received. Indication: A-Fib Last office visit: 07/10/24 Scr: 2.62 06/16/24 Care Everywhere Age: 74 Weight: 102.1 KG Pt has Passed Parameters Not Comfortable with Creatine level

## 2024-07-14 MED ORDER — APIXABAN 5 MG PO TABS: 5.0000 mg | ORAL_TABLET | Freq: Two times a day (BID) | ORAL | 1 refills | Status: AC

## 2024-07-18 ENCOUNTER — Other Ambulatory Visit: Payer: Self-pay | Admitting: Internal Medicine

## 2024-07-18 ENCOUNTER — Telehealth: Payer: Self-pay

## 2024-07-18 ENCOUNTER — Ambulatory Visit

## 2024-07-18 DIAGNOSIS — I5022 Chronic systolic (congestive) heart failure: Secondary | ICD-10-CM

## 2024-07-18 DIAGNOSIS — Z95810 Presence of automatic (implantable) cardiac defibrillator: Secondary | ICD-10-CM

## 2024-07-18 NOTE — Telephone Encounter (Signed)
Remote ICM transmission received.  Attempted call to patient regarding ICM remote transmission and no answer or voice mail. ° °

## 2024-07-18 NOTE — Progress Notes (Signed)
 EPIC Encounter for ICM Monitoring  Patient Name: Christopher [name] is a 74 y.o. male Date: 07/18/2024 Primary Care Physican: [name], MD Primary Cardiologist: [facility] Electrophysiologist: Inocencio 09/08/2023 Weight: 226 lbs (has not been weighing daily) 09/21/2023 Office Weight: 225 lbs 10/19/2023 Office Weight: 227 lbs 12/24/2023 Weight: 227 lbs 03/03/2024 Office Weight: 229 lbs 04/28/2024 Weight: 228-229 lbs 05/30/2024 Weight: 229 lbs 07/10/2024 Office Weight: 225 lbs   Since 10-Jul-2024 Time in AF  24.0 hr/day (100.0%)  Prescribed Eliquis  (AT/AF Burden has increased from 2.2% on 05/15/2024)   Attempted call to patient and unable to reach.   Transmission results reviewed.    Since 07/10/2024 ICM Remote Transmission: Optivol thoracic impedance suggesting possible fluid accumulation starting 05/14/2024.  Possible fluid accumulation appears to correlate with AT/AF starting 05/14/2024.       Prescribed:  Furosemide  80 mg take 1 tablet (80 mg total) by mouth daily.    Spironolactone  25 mg take 1 tablet (25 mg total) daily   Labs: 06/16/2024 Creatinine 2.67, BUN 30, Potassium 4.0, Sodium 138  06/08/2024 Creatinine 2.72, BUN 30, Potassium 3.8, Sodium 136, GFR 24  06/07/2024 Creatinine 2.51, BUN 29, Potassium 4.2, Sodium 137, GFR 26  06/06/2024 Creatinine 2.70, BUN 33, Potassium 4.6, Sodium 139, GFR 24 A complete set of results can be found in Results Review.   Recommendations:  Unable to reach.      Follow-up plan: ICM clinic phone appointment on 07/31/2024 to recheck fluid levels.  Next 31 day visit due 08/15/2024.   91 day device clinic remote transmission 08/14/2024.      EP/Cardiology Office Visits:  07/31/2024 with Orren Fabry, PA.  Recall 06/11/2024 with Dr Inocencio.   Copy of ICM check sent to Dr. Inocencio.    Remote monitoring is medically necessary for Heart Failure Management.    Daily Thoracic Impedance ICM trend: 04/18/2024 through 07/18/2024.    12-14 Month Thoracic  Impedance ICM trend:     [name], RN 07/18/2024 7:44 AM

## 2024-07-19 ENCOUNTER — Other Ambulatory Visit: Payer: Self-pay

## 2024-07-19 LAB — BASIC METABOLIC PANEL WITH GFR
BUN/Creatinine Ratio: 9 — ABNORMAL LOW (ref 10–24)
BUN: 24 mg/dL (ref 8–27)
CO2: 24 mmol/L (ref 20–29)
Calcium: 9.5 mg/dL (ref 8.6–10.2)
Chloride: 96 mmol/L (ref 96–106)
Creatinine, Ser: 2.53 mg/dL — ABNORMAL HIGH (ref 0.76–1.27)
Glucose: 130 mg/dL — ABNORMAL HIGH (ref 70–99)
Potassium: 4 mmol/L (ref 3.5–5.2)
Sodium: 138 mmol/L (ref 134–144)
eGFR: 26 mL/min/1.73 — ABNORMAL LOW

## 2024-07-19 LAB — BRAIN NATRIURETIC PEPTIDE: BNP: 290.6 pg/mL — AB (ref 0.0–100.0)

## 2024-07-19 NOTE — Progress Notes (Signed)
 Spoke with patient and heart failure questions reviewed.  Transmission results reviewed.  Pt asymptomatic for fluid accumulation.  He reports compliance with taking Furosemide  as ordered.  He has been eating a lot of junk food for past couple of months and trying to eat it all so he can get back to normal diet.  Explained effects of high salty foods and encouraged him to stop eating the foods and start low salt diet.  Pt reports he feels fine.

## 2024-07-19 NOTE — Patient Outreach (Signed)
 Complex Care Management   Visit Note  07/19/2024  Name:  Christopher [name] MRN: [id number] DOB: 1950-07-26  Situation: Referral received for Complex Care Management related to Heart Failure I obtained verbal consent from Patient.  Visit completed with Patient  on the phone  Background:   Past Medical History:  Diagnosis Date   Arthritis    Atrial fibrillation (HCC)    post op, intol of anticoag   Cardiac arrest [facility])    a. s/p MDT single chamber ICD; 11-10-18- pt denies having a heart attack   Cataract    CHF (congestive heart failure) (HCC)    Chronic kidney disease    kidney stone   Coronary artery disease    a. 2/7 Cath: LM nl, LAD min irregs, LCX min irregs, RI 40, RCA 41m, EF 55-60% basal to mid inf HK, 3-4+ MR;  b. 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 Does patient have uncontrolled Hypertension?: No Cardiovascular Management Strategies: Routine screening, Medication therapy, Adequate rest  Respiratory Respiratory Symptoms Reported: No symptoms reported Other Respiratory Symptoms: patient denies any respiratory problems at this time. Dry cough noted-patient states swallowed the wrong way, but denies any problems at this time    Endocrine Endocrine Symptoms Reported: No symptoms reported Is patient diabetic?: Yes Is patient checking blood sugars at home?: Yes (checking 1-2 times/day a day.) List most recent blood sugar readings, include date and time of day: BS this morning 126 fasting. RNCM discussed target BS 80-130 fasting and <180 2hours after a meal. Patient states he has been eating candy since Holiday.during the holidays. A1C 5.9 on 06/01/24.    Gastrointestinal Gastrointestinal Symptoms Reported: Nausea Additional Gastrointestinal Details: reports eats sometimes and then cant swallow it. Reports happened a couple of days ago. patient reports started several months ago. RNCM advised patient to notify provider if continues or does no improve. Gastrointestinal Management Strategies: Diet modification Gastrointestinal Self-Management Outcome: 3 (uncertain)    Genitourinary Genitourinary Symptoms Reported: No symptoms reported Additional Genitourinary Details: patient reports no symptoms, reports improved since kidney stone removed.  Integumentary Integumentary Symptoms Reported: No symptoms reported    Musculoskeletal Musculoskelatal Symptoms Reviewed: Unsteady gait Additional Musculoskeletal Details: reports gait is a little unsteady due to history of stroke.  patient states uses cane or walker at time. Musculoskeletal Management Strategies: Medication therapy, Medical device, Adequate rest, Activity Falls in the past year?: Yes Number of falls in past year: 1 or less Was there an injury with Fall?: No Fall Risk Category Calculator: 1 Patient Fall Risk Level: Low Fall Risk Patient at Risk for Falls Due to: Impaired balance/gait Fall risk Follow up: Education provided, Falls prevention discussed  Psychosocial Psychosocial Symptoms Reported: No symptoms reported     Quality of Family Relationships: non-existent (I ain't got much help)   There were no vitals filed for this visit. Pain Scale: 0-10 Pain Score: 0-No pain  Medications Reviewed Today     Reviewed by Yaire Kreher M, RN (Registered Nurse) on 07/19/24 at 1504  Med List Status: <None>   Medication Order Taking? Sig Documenting Provider Last Dose Status Informant  Accu-Chek Softclix Lancets lancets [id number]  1 each by Other route See admin instructions. Use TID [name], MD  Active Self  acetaminophen  (TYLENOL ) 500 MG tablet [id number] Yes Take 1,000 mg by mouth every 6 (six) hours as needed for mild pain (pain score 1-3) or headache. [provider]  Active Self  amiodarone  (PACERONE ) 200 MG tablet [id number] Yes Take 1 tablet (200 mg total) by mouth 2 (two) times daily for 30 days, THEN 1 tablet (200 mg total) daily. Inocencio Soyla Lunger, MD  Active Self  apixaban  (ELIQUIS ) 5 MG TABS tablet [id number] Yes Take 1 tablet (5 mg total) by mouth 2 (two) times daily. Mona Vinie BROCKS, MD  Active   atorvastatin  (LIPITOR) 40 MG tablet [id number] Yes Take 1 tablet (40 mg total) by mouth daily. [name], MD  Active Self  cetirizine (ZYRTEC) 10 MG tablet [id number] Yes Take 10 mg by mouth daily. [provider]  Active Self  cholecalciferol  (VITAMIN D ) 25 MCG (1000 UNIT) tablet [id number] Yes Take 1,000 Units by mouth daily. [provider]   Active Self  Choline Fenofibrate  (FENOFIBRIC ACID ) 45 MG CPDR [id number]  Take 1 tablet by mouth daily.  Patient not taking: Reported on 07/19/2024   Mona Vinie BROCKS, MD  Active Self  dapagliflozin  propanediol (FARXIGA ) 10 MG TABS tablet [id number] Yes Take 1 tablet (10 mg total) by mouth daily. [name], MD  Active Self  fluticasone  (FLONASE ) 50 MCG/ACT nasal spray [id number] Yes Place 2 sprays into both nostrils daily. [name], MD  Active Self  furosemide  (LASIX ) 80 MG tablet [id number] Yes Take 1 tablet (80 mg total) by mouth daily. Mona Vinie BROCKS, MD  Active   glucose blood (ACCU-CHEK GUIDE TEST) test strip [id number]  Use as instructed [name], MD  Active Self  HYDROcodone -acetaminophen  (NORCO/VICODIN) 5-325 MG tablet [id number]  Take 1 tablet by mouth every 6 (six) hours as needed.  Patient not taking: Reported on 07/19/2024   Carolee Sherwood JONETTA DOUGLAS, MD  Active   Insulin  Lispro Prot & Lispro (HUMALOG  MIX 75/25 KWIKPEN) (75-25) 100 UNIT/ML Kary [id number] Yes Inject 18 Units into the skin in the morning and at bedtime. [name], MD  Active Self           Med Note (GAINES, JANEL R   Wed Jun 28, 2024  9:47 AM) 12 UNITS   metFORMIN  (GLUCOPHAGE ) 500 MG tablet [id number] Yes Take 1  tablet (500 mg total) by mouth 2 (two) times daily with a meal. Gonfa, Taye T, MD  Active Self  metoprolol  succinate (TOPROL -XL) 100 MG 24 hr tablet [id number] Yes Take 1 tablet (100 mg total) by mouth daily. [name], MD  Active Self  naphazoline-pheniramine (NAPHCON-A) 0.025-0.3 % ophthalmic solution [id number]  Place 2 drops into both eyes in the morning.  Patient not taking: Reported on 07/19/2024   [provider]  Active Self  OVER THE COUNTER MEDICATION [id number]  Take 1 tablet by mouth in the morning and at bedtime. [facility] - Restore multivitamin  Patient not taking: Reported on 07/19/2024   [provider]  Active Self   spironolactone  (ALDACTONE ) 25 MG tablet [id number] Yes TAKE 1 TABLET BY MOUTH DAILY Hilty, Vinie BROCKS, MD  Active Self  tamsulosin  (FLOMAX ) 0.4 MG CAPS capsule [id number] Yes Take 1 capsule (0.4 mg total) by mouth daily. [name], MD  Active Self          Recommendation:   Specialty provider follow-up 07/31/24  Follow Up Plan:   Telephone follow up appointment date/time:  08/02/24 at 1:30 pm  Heddy Shutter, RN, MSN, BSN, CCM [place]  [facility], Population Health Case Manager Phone: [phone]

## 2024-07-19 NOTE — Patient Instructions (Signed)
 Visit Information  Thank you for taking time to visit with me today. Please don't hesitate to contact me if I can be of assistance to you before our next scheduled appointment.  Our next appointment is by telephone on 08/02/24 at 1:30 pm Please call the care guide team at [phone] if you need to cancel or reschedule your appointment.   Following is a copy of your care plan:   Goals Addressed             This Visit's Progress    VBCI RN Care Plan       Problems:  Chronic Disease Management support and education needs related to CHF  Goal: Over the next 90 days the Patient will continue to work with RN Care Manager and/or Social Worker to address care management and care coordination needs related to CHF as evidenced by adherence to care management team scheduled appointments     demonstrate Improved adherence to prescribed treatment plan for CHF as evidenced by patient report and/or review of chart  Interventions:   Heart Failure Interventions: Discussed the importance of keeping all appointments with provider Advised patient to discuss any health questions or concerns with provider Offered to provide educational material-patient declines Reviewed upcoming appointment scheduled 07/31/24 Reviewed patient instructions per cardiology visit completed on 07/10/24.  Patient Self-Care Activities:  Attend all scheduled provider appointments Call pharmacy for medication refills 3-7 days in advance of running out of medications Call provider office for new concerns or questions  Take medications as prescribed    Plan:  Telephone follow up appointment with care management team member scheduled for:  08/02/24 at 1:30 pm      Please call the Suicide and Crisis Lifeline: 988 call the [place]  National Suicide Prevention Lifeline: (705)445-9958 or TTY: (825) 773-4232 TTY 361-103-1919) to talk to a trained counselor if you are experiencing a Mental Health or Behavioral Health Crisis or need  someone to talk to.  Patient verbalized understanding of Care plan and visit instructions communicated this visit  Heddy Shutter, RN, MSN, BSN, CCM [place]  [facility], Population Health Case Manager Phone: [phone]

## 2024-07-24 ENCOUNTER — Ambulatory Visit: Payer: Self-pay | Admitting: Internal Medicine

## 2024-07-25 ENCOUNTER — Other Ambulatory Visit: Payer: Self-pay | Admitting: Cardiology

## 2024-07-26 ENCOUNTER — Encounter: Payer: Self-pay | Admitting: Physician Assistant

## 2024-07-28 ENCOUNTER — Encounter: Payer: Self-pay | Admitting: Internal Medicine

## 2024-07-31 ENCOUNTER — Ambulatory Visit

## 2024-07-31 ENCOUNTER — Ambulatory Visit: Admitting: Physician Assistant

## 2024-08-01 NOTE — Telephone Encounter (Signed)
 Labs on 07/18/24 Outside of Normal

## 2024-08-02 ENCOUNTER — Other Ambulatory Visit: Payer: Self-pay

## 2024-08-02 NOTE — Patient Outreach (Signed)
 Complex Care Management   Visit Note  08/02/2024  Name:  Christopher [name] MRN: [id number] DOB: 08-Oct-1950  Situation: Referral received for Complex Care Management related to Heart Failure I obtained verbal consent from Patient.  Visit completed with Patient  on the phone  Background:   Past Medical History:  Diagnosis Date   Arthritis    Atrial fibrillation (HCC)    post op, intol of anticoag   Cardiac arrest [facility])    a. s/p MDT single chamber ICD; 11-10-18- pt denies having a heart attack   Cataract    CHF (congestive heart failure) (HCC)    Chronic kidney disease    kidney stone   Coronary artery disease    a. 2/7 Cath: LM nl, LAD min irregs, LCX min irregs, RI 40, RCA 32m, EF 55-60% basal to mid inf HK, 3-4+ MR;  b. 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 Patient Reported Symptoms:  Cognitive Cognitive Status: No symptoms reported      Neurological Neurological Review of Symptoms: No symptoms reported    HEENT HEENT  Symptoms Reported: No symptoms reported      Cardiovascular Cardiovascular Symptoms Reported: No symptoms reported    Respiratory Respiratory Symptoms Reported: No symptoms reported    Endocrine Endocrine Symptoms Reported: No symptoms reported Is patient diabetic?: Yes Is patient checking blood sugars at home?: Yes (patient reports checkes BS daily)    Gastrointestinal Gastrointestinal Symptoms Reported: No symptoms reported      Genitourinary Genitourinary Symptoms Reported: No symptoms reported    Integumentary Integumentary Symptoms Reported: No symptoms reported    Musculoskeletal Musculoskelatal Symptoms Reviewed: No symptoms reported Additional Musculoskeletal Details: I am doing pretty good.        Psychosocial Psychosocial Symptoms Reported: No symptoms reported          08/02/2024    PHQ2-9 Depression Screening   Little interest or pleasure in doing things Not at all  Feeling down, depressed, or hopeless Not at all  PHQ-2 - Total Score 0  Trouble falling or staying asleep, or sleeping too much    Feeling tired or having little energy    Poor appetite or overeating     Feeling bad about yourself - or that you are a failure or have let yourself or your family down    Trouble concentrating on things, such as reading the newspaper or watching television    Moving or speaking so slowly that other  people could have noticed.  Or the opposite - being so fidgety or restless that you have been moving around a lot more than usual    Thoughts that you would be better off dead, or hurting yourself in some way    PHQ2-9 Total Score    If you checked off any problems, how difficult have these problems made it for you to do your work, take care of things at home, or get along with other people    Depression Interventions/Treatment      There were no vitals filed for this visit. Pain Score: 0-No pain  Medications Reviewed Today     Reviewed by Drevin Ortner M, RN  (Registered Nurse) on 08/02/24 at 1354  Med List Status: <None>   Medication Order Taking? Sig Documenting Provider Last Dose Status Informant  Accu-Chek Softclix Lancets lancets [id number]  1 each by Other route See admin instructions. Use TID [name], MD  Active Self  acetaminophen  (TYLENOL ) 500 MG tablet [id number] Yes Take 1,000 mg by mouth every 6 (six) hours as needed for mild pain (pain score 1-3) or headache. [provider]  Active Self  amiodarone  (PACERONE ) 200 MG tablet [id number] Yes Take 1 tablet (200 mg total) by mouth 2 (two) times daily for 30 days, THEN 1 tablet (200 mg total) daily. Inocencio Soyla Lunger, MD  Active Self  apixaban  (ELIQUIS ) 5 MG TABS tablet [id number] Yes Take 1 tablet (5 mg total) by mouth 2 (two) times daily. Mona Vinie BROCKS, MD  Active   atorvastatin  (LIPITOR) 40 MG tablet [id number] Yes Take 1 tablet (40 mg total) by mouth daily. [name], MD  Active Self  cetirizine (ZYRTEC) 10 MG tablet [id number] Yes Take 10 mg by mouth daily. [provider]  Active Self  cholecalciferol  (VITAMIN D ) 25 MCG (1000 UNIT) tablet [id number] Yes Take 1,000 Units by mouth daily. [provider]  Active Self  Choline Fenofibrate  (FENOFIBRIC ACID ) 45 MG CPDR [id number]  Take 1 tablet by mouth daily.  Patient not taking: Reported on 08/02/2024   Mona Vinie BROCKS, MD  Active Self  dapagliflozin  propanediol (FARXIGA ) 10 MG TABS tablet [id number] Yes Take 1 tablet (10 mg total) by mouth daily. Mona Vinie BROCKS, MD  Active   fluticasone  (FLONASE ) 50 MCG/ACT nasal spray [id number] Yes Place 2 sprays into both nostrils daily. [name], MD  Active Self  furosemide  (LASIX ) 80 MG tablet [id number] Yes Take 1 tablet (80 mg total) by mouth daily. Mona Vinie BROCKS, MD  Active   glucose blood (ACCU-CHEK GUIDE TEST) test strip [id number]  Use as instructed [name], MD  Active Self  HYDROcodone -acetaminophen  (NORCO/VICODIN) 5-325  MG tablet [id number]  Take 1 tablet by mouth every 6 (six) hours as needed.  Patient not taking: Reported on 08/02/2024   Carolee Sherwood JONETTA DOUGLAS, MD  Active   Insulin  Lispro Prot & Lispro (HUMALOG  MIX 75/25 KWIKPEN) (75-25) 100 UNIT/ML Kary [id number] Yes Inject 18 Units into the skin in the morning and at bedtime. [name], MD  Active Self           Med Note (GAINES, JANEL R   Wed Jun 28, 2024  9:47 AM) 12 UNITS   metFORMIN  (GLUCOPHAGE ) 500 MG tablet [id number] Yes Take 1 tablet (500 mg total) by mouth 2 (two) times daily with a meal. Gonfa, Taye T, MD  Active Self  metoprolol  succinate (TOPROL -XL) 100 MG 24 hr tablet [id number] Yes Take 1 tablet (100  mg total) by mouth daily. [name], MD  Active Self  naphazoline-pheniramine (NAPHCON-A) 0.025-0.3 % ophthalmic solution [id number]  Place 2 drops into both eyes in the morning.  Patient not taking: Reported on 08/02/2024   [provider]  Active Self  OVER THE COUNTER MEDICATION [id number]  Take 1 tablet by mouth in the morning and at bedtime. [facility] - Restore multivitamin  Patient not taking: Reported on 08/02/2024   [provider]  Active Self  spironolactone  (ALDACTONE ) 25 MG tablet [id number] Yes TAKE 1 TABLET BY MOUTH DAILY Hilty, Vinie BROCKS, MD  Active Self  tamsulosin  (FLOMAX ) 0.4 MG CAPS capsule [id number] Yes Take 1 capsule (0.4 mg total) by mouth daily. [name], MD  Active Self          Recommendation:   Continue Current Plan of Care  Follow Up Plan:   Telephone follow up appointment date/time:  08/31/24 at 3:00 pm  Heddy Shutter, RN, MSN, BSN, CCM [place]  [facility], Population Health Case Manager Phone: [phone]

## 2024-08-02 NOTE — Patient Instructions (Signed)
 Visit Information  Thank you for taking time to visit with me today. Please don't hesitate to contact me if I can be of assistance to you before our next scheduled appointment.  Your next care management appointment is by telephone on 08/31/24 at 3:00 pm  Please call the care guide team at [phone] if you need to cancel, schedule, or reschedule an appointment.   Please call the Suicide and Crisis Lifeline: 988 call the [place]  National Suicide Prevention Lifeline: 640-514-5264 or TTY: 506-353-9560 TTY (939) 865-9221) to talk to a trained counselor if you are experiencing a Mental Health or Behavioral Health Crisis or need someone to talk to.  Heddy Shutter, RN, MSN, BSN, CCM [place]  [facility], Population Health Case Manager Phone: [phone]

## 2024-08-09 ENCOUNTER — Ambulatory Visit: Admitting: Podiatry

## 2024-08-14 ENCOUNTER — Ambulatory Visit: Payer: Medicare Other

## 2024-08-22 ENCOUNTER — Ambulatory Visit: Admitting: Podiatry

## 2024-08-31 ENCOUNTER — Telehealth

## 2024-09-13 ENCOUNTER — Ambulatory Visit: Admitting: Emergency Medicine

## 2024-11-13 ENCOUNTER — Ambulatory Visit

## 2024-11-23 ENCOUNTER — Ambulatory Visit

## 2024-12-01 ENCOUNTER — Ambulatory Visit: Admitting: Internal Medicine

## 2025-02-12 ENCOUNTER — Ambulatory Visit

## 2025-05-14 ENCOUNTER — Ambulatory Visit

## 2025-08-13 ENCOUNTER — Ambulatory Visit

## 2025-11-12 ENCOUNTER — Ambulatory Visit
